# Patient Record
Sex: Female | Born: 1955 | Race: White | Hispanic: No | Marital: Married | Smoking: Never smoker
Health system: Southern US, Community
[De-identification: ages and names within clinical notes are randomized; demographics above are authoritative.]

## PROBLEM LIST (undated history)

## (undated) DIAGNOSIS — Z853 Personal history of malignant neoplasm of breast: Secondary | ICD-10-CM

## (undated) DIAGNOSIS — F329 Major depressive disorder, single episode, unspecified: Secondary | ICD-10-CM

## (undated) DIAGNOSIS — K219 Gastro-esophageal reflux disease without esophagitis: Secondary | ICD-10-CM

## (undated) DIAGNOSIS — F209 Schizophrenia, unspecified: Secondary | ICD-10-CM

## (undated) DIAGNOSIS — R109 Unspecified abdominal pain: Secondary | ICD-10-CM

## (undated) DIAGNOSIS — C801 Malignant (primary) neoplasm, unspecified: Secondary | ICD-10-CM

## (undated) DIAGNOSIS — I10 Essential (primary) hypertension: Secondary | ICD-10-CM

## (undated) DIAGNOSIS — G43909 Migraine, unspecified, not intractable, without status migrainosus: Secondary | ICD-10-CM

## (undated) DIAGNOSIS — E785 Hyperlipidemia, unspecified: Secondary | ICD-10-CM

## (undated) DIAGNOSIS — J309 Allergic rhinitis, unspecified: Secondary | ICD-10-CM

## (undated) DIAGNOSIS — N201 Calculus of ureter: Secondary | ICD-10-CM

## (undated) DIAGNOSIS — I5043 Acute on chronic combined systolic (congestive) and diastolic (congestive) heart failure: Secondary | ICD-10-CM

## (undated) DIAGNOSIS — E119 Type 2 diabetes mellitus without complications: Principal | ICD-10-CM

## (undated) DIAGNOSIS — G40409 Other generalized epilepsy and epileptic syndromes, not intractable, without status epilepticus: Secondary | ICD-10-CM

## (undated) DIAGNOSIS — D649 Anemia, unspecified: Secondary | ICD-10-CM

## (undated) DIAGNOSIS — M1990 Unspecified osteoarthritis, unspecified site: Secondary | ICD-10-CM

## (undated) DIAGNOSIS — F32A Depression, unspecified: Secondary | ICD-10-CM

## (undated) HISTORY — DX: Major depressive disorder, single episode, unspecified: F32.9

## (undated) HISTORY — PX: CATARACT EXTRACTION, BILATERAL: SHX1313

## (undated) HISTORY — PX: BREAST SURGERY: SHX581

## (undated) HISTORY — DX: Essential (primary) hypertension: I10

## (undated) HISTORY — DX: Hyperlipidemia, unspecified: E78.5

## (undated) HISTORY — DX: Migraine, unspecified, not intractable, without status migrainosus: G43.909

## (undated) HISTORY — DX: Malignant (primary) neoplasm, unspecified: C80.1

## (undated) HISTORY — DX: Gastro-esophageal reflux disease without esophagitis: K21.9

## (undated) HISTORY — DX: Type 2 diabetes mellitus without complications: E11.9

## (undated) HISTORY — DX: Depression, unspecified: F32.A

## (undated) HISTORY — PX: LAPAROSCOPIC CHOLECYSTECTOMY: SUR755

## (undated) HISTORY — PX: OTHER SURGICAL HISTORY: SHX169

---

## 2000-01-04 ENCOUNTER — Other Ambulatory Visit: Admission: RE | Admit: 2000-01-04 | Discharge: 2000-01-04 | Payer: Self-pay | Admitting: Obstetrics and Gynecology

## 2000-01-07 ENCOUNTER — Other Ambulatory Visit: Admission: RE | Admit: 2000-01-07 | Discharge: 2000-01-07 | Payer: Self-pay | Admitting: Obstetrics and Gynecology

## 2000-01-07 ENCOUNTER — Encounter (INDEPENDENT_AMBULATORY_CARE_PROVIDER_SITE_OTHER): Payer: Self-pay

## 2000-04-10 ENCOUNTER — Other Ambulatory Visit: Admission: RE | Admit: 2000-04-10 | Discharge: 2000-04-10 | Payer: Self-pay | Admitting: Obstetrics and Gynecology

## 2001-08-09 ENCOUNTER — Emergency Department (HOSPITAL_COMMUNITY): Admission: EM | Admit: 2001-08-09 | Discharge: 2001-08-10 | Payer: Self-pay | Admitting: Emergency Medicine

## 2001-08-10 ENCOUNTER — Encounter: Payer: Self-pay | Admitting: Emergency Medicine

## 2004-06-13 ENCOUNTER — Other Ambulatory Visit: Admission: RE | Admit: 2004-06-13 | Discharge: 2004-06-13 | Payer: Self-pay | Admitting: Family Medicine

## 2004-10-04 ENCOUNTER — Encounter: Admission: RE | Admit: 2004-10-04 | Discharge: 2004-10-04 | Payer: Self-pay | Admitting: Family Medicine

## 2005-02-15 ENCOUNTER — Emergency Department (HOSPITAL_COMMUNITY): Admission: EM | Admit: 2005-02-15 | Discharge: 2005-02-15 | Payer: Self-pay | Admitting: Emergency Medicine

## 2005-07-03 ENCOUNTER — Other Ambulatory Visit: Admission: RE | Admit: 2005-07-03 | Discharge: 2005-07-03 | Payer: Self-pay | Admitting: Family Medicine

## 2005-08-01 ENCOUNTER — Other Ambulatory Visit (HOSPITAL_COMMUNITY): Admission: RE | Admit: 2005-08-01 | Discharge: 2005-08-15 | Payer: Self-pay | Admitting: Psychiatry

## 2005-08-01 ENCOUNTER — Ambulatory Visit: Payer: Self-pay | Admitting: Psychiatry

## 2005-09-13 ENCOUNTER — Encounter (INDEPENDENT_AMBULATORY_CARE_PROVIDER_SITE_OTHER): Payer: Self-pay | Admitting: Specialist

## 2005-09-13 ENCOUNTER — Ambulatory Visit (HOSPITAL_COMMUNITY): Admission: RE | Admit: 2005-09-13 | Discharge: 2005-09-13 | Payer: Self-pay | Admitting: Gastroenterology

## 2006-04-04 ENCOUNTER — Emergency Department (HOSPITAL_COMMUNITY): Admission: EM | Admit: 2006-04-04 | Discharge: 2006-04-04 | Payer: Self-pay | Admitting: Emergency Medicine

## 2006-06-19 ENCOUNTER — Ambulatory Visit: Payer: Self-pay | Admitting: Family Medicine

## 2006-07-03 ENCOUNTER — Ambulatory Visit: Payer: Self-pay | Admitting: Family Medicine

## 2006-07-11 ENCOUNTER — Ambulatory Visit: Payer: Self-pay | Admitting: Family Medicine

## 2006-09-11 ENCOUNTER — Ambulatory Visit: Payer: Self-pay | Admitting: Family Medicine

## 2006-10-10 ENCOUNTER — Ambulatory Visit: Payer: Self-pay | Admitting: Family Medicine

## 2006-10-21 ENCOUNTER — Ambulatory Visit: Payer: Self-pay | Admitting: Family Medicine

## 2007-02-13 ENCOUNTER — Ambulatory Visit: Payer: Self-pay | Admitting: Family Medicine

## 2007-06-04 ENCOUNTER — Ambulatory Visit: Payer: Self-pay | Admitting: Family Medicine

## 2007-07-16 ENCOUNTER — Other Ambulatory Visit: Admission: RE | Admit: 2007-07-16 | Discharge: 2007-07-16 | Payer: Self-pay | Admitting: Family Medicine

## 2007-07-16 ENCOUNTER — Ambulatory Visit: Payer: Self-pay | Admitting: Family Medicine

## 2007-08-11 ENCOUNTER — Ambulatory Visit: Payer: Self-pay | Admitting: Family Medicine

## 2007-10-02 ENCOUNTER — Ambulatory Visit: Payer: Self-pay | Admitting: Family Medicine

## 2008-09-02 ENCOUNTER — Ambulatory Visit: Payer: Self-pay | Admitting: Family Medicine

## 2008-10-21 ENCOUNTER — Ambulatory Visit: Payer: Self-pay | Admitting: Family Medicine

## 2008-11-25 ENCOUNTER — Encounter: Admission: RE | Admit: 2008-11-25 | Discharge: 2008-11-25 | Payer: Self-pay | Admitting: Radiology

## 2008-12-22 ENCOUNTER — Ambulatory Visit: Payer: Self-pay | Admitting: Oncology

## 2008-12-23 ENCOUNTER — Ambulatory Visit: Payer: Self-pay | Admitting: Genetic Counselor

## 2008-12-23 ENCOUNTER — Ambulatory Visit: Payer: Self-pay | Admitting: Family Medicine

## 2009-01-02 ENCOUNTER — Encounter: Admission: RE | Admit: 2009-01-02 | Discharge: 2009-01-02 | Payer: Self-pay | Admitting: Surgery

## 2009-01-04 ENCOUNTER — Encounter (INDEPENDENT_AMBULATORY_CARE_PROVIDER_SITE_OTHER): Payer: Self-pay | Admitting: Surgery

## 2009-01-04 ENCOUNTER — Ambulatory Visit (HOSPITAL_BASED_OUTPATIENT_CLINIC_OR_DEPARTMENT_OTHER): Admission: RE | Admit: 2009-01-04 | Discharge: 2009-01-04 | Payer: Self-pay | Admitting: Surgery

## 2009-01-16 ENCOUNTER — Ambulatory Visit: Payer: Self-pay | Admitting: Radiation Oncology

## 2009-02-09 ENCOUNTER — Ambulatory Visit: Payer: Self-pay | Admitting: Radiation Oncology

## 2009-02-09 ENCOUNTER — Ambulatory Visit: Payer: Self-pay | Admitting: Genetic Counselor

## 2009-02-13 ENCOUNTER — Ambulatory Visit: Payer: Self-pay

## 2009-02-13 ENCOUNTER — Ambulatory Visit: Payer: Self-pay | Admitting: Radiation Oncology

## 2009-03-16 ENCOUNTER — Ambulatory Visit: Payer: Self-pay | Admitting: Radiation Oncology

## 2009-04-12 ENCOUNTER — Ambulatory Visit: Payer: Self-pay | Admitting: Family Medicine

## 2009-04-15 ENCOUNTER — Ambulatory Visit: Payer: Self-pay | Admitting: Radiation Oncology

## 2009-04-19 ENCOUNTER — Ambulatory Visit: Payer: Self-pay | Admitting: Genetic Counselor

## 2009-04-21 ENCOUNTER — Ambulatory Visit: Payer: Self-pay | Admitting: Oncology

## 2009-04-21 LAB — COMPREHENSIVE METABOLIC PANEL
AST: 44 U/L — ABNORMAL HIGH (ref 0–37)
Albumin: 4.3 g/dL (ref 3.5–5.2)
Alkaline Phosphatase: 126 U/L — ABNORMAL HIGH (ref 39–117)
Glucose, Bld: 133 mg/dL — ABNORMAL HIGH (ref 70–99)
Potassium: 3.4 mEq/L — ABNORMAL LOW (ref 3.5–5.3)
Sodium: 143 mEq/L (ref 135–145)
Total Protein: 6.6 g/dL (ref 6.0–8.3)

## 2009-04-21 LAB — CBC WITH DIFFERENTIAL/PLATELET
EOS%: 2.3 % (ref 0.0–7.0)
Eosinophils Absolute: 0.1 10*3/uL (ref 0.0–0.5)
MCV: 83.7 fL (ref 79.5–101.0)
MONO%: 9.1 % (ref 0.0–14.0)
NEUT#: 2.5 10*3/uL (ref 1.5–6.5)
RBC: 4.35 10*6/uL (ref 3.70–5.45)
RDW: 14.3 % (ref 11.2–14.5)

## 2009-04-27 ENCOUNTER — Ambulatory Visit: Payer: Self-pay | Admitting: Family Medicine

## 2009-04-27 ENCOUNTER — Encounter: Admission: RE | Admit: 2009-04-27 | Discharge: 2009-04-27 | Payer: Self-pay | Admitting: Family Medicine

## 2009-05-16 ENCOUNTER — Ambulatory Visit: Payer: Self-pay | Admitting: Radiation Oncology

## 2009-06-15 ENCOUNTER — Ambulatory Visit: Payer: Self-pay | Admitting: Radiation Oncology

## 2009-07-12 ENCOUNTER — Ambulatory Visit: Payer: Self-pay | Admitting: Oncology

## 2009-07-13 ENCOUNTER — Emergency Department (HOSPITAL_COMMUNITY): Admission: EM | Admit: 2009-07-13 | Discharge: 2009-07-13 | Payer: Self-pay | Admitting: Emergency Medicine

## 2009-07-14 ENCOUNTER — Ambulatory Visit (HOSPITAL_COMMUNITY): Admission: RE | Admit: 2009-07-14 | Discharge: 2009-07-14 | Payer: Self-pay | Admitting: Neurology

## 2009-09-04 ENCOUNTER — Ambulatory Visit: Payer: Self-pay | Admitting: Family Medicine

## 2009-09-25 ENCOUNTER — Ambulatory Visit: Payer: Self-pay | Admitting: Oncology

## 2009-09-27 ENCOUNTER — Ambulatory Visit: Payer: Self-pay | Admitting: Family Medicine

## 2009-09-27 LAB — CBC WITH DIFFERENTIAL/PLATELET
Basophils Absolute: 0 10*3/uL (ref 0.0–0.1)
Eosinophils Absolute: 0.2 10*3/uL (ref 0.0–0.5)
HGB: 12.8 g/dL (ref 11.6–15.9)
LYMPH%: 22.3 % (ref 14.0–49.7)
MCV: 86.7 fL (ref 79.5–101.0)
MONO#: 0.3 10*3/uL (ref 0.1–0.9)
NEUT#: 3.7 10*3/uL (ref 1.5–6.5)
Platelets: 322 10*3/uL (ref 145–400)
RBC: 4.36 10*6/uL (ref 3.70–5.45)
RDW: 14.1 % (ref 11.2–14.5)
WBC: 5.5 10*3/uL (ref 3.9–10.3)

## 2009-09-28 LAB — LACTATE DEHYDROGENASE: LDH: 164 U/L (ref 94–250)

## 2009-09-28 LAB — COMPREHENSIVE METABOLIC PANEL
Albumin: 4.4 g/dL (ref 3.5–5.2)
BUN: 11 mg/dL (ref 6–23)
CO2: 21 mEq/L (ref 19–32)
Glucose, Bld: 176 mg/dL — ABNORMAL HIGH (ref 70–99)
Potassium: 4.1 mEq/L (ref 3.5–5.3)
Sodium: 144 mEq/L (ref 135–145)
Total Protein: 6.7 g/dL (ref 6.0–8.3)

## 2009-09-28 LAB — CANCER ANTIGEN 27.29: CA 27.29: 14 U/mL (ref 0–39)

## 2010-01-16 ENCOUNTER — Ambulatory Visit: Payer: Self-pay | Admitting: Radiation Oncology

## 2010-01-22 ENCOUNTER — Ambulatory Visit: Payer: Self-pay | Admitting: Radiation Oncology

## 2010-02-13 ENCOUNTER — Ambulatory Visit: Payer: Self-pay | Admitting: Radiation Oncology

## 2010-04-03 ENCOUNTER — Ambulatory Visit: Payer: Self-pay | Admitting: Oncology

## 2010-04-09 LAB — CANCER ANTIGEN 27.29: CA 27.29: 16 U/mL (ref 0–39)

## 2010-04-09 LAB — COMPREHENSIVE METABOLIC PANEL
AST: 23 U/L (ref 0–37)
Albumin: 3.8 g/dL (ref 3.5–5.2)
Alkaline Phosphatase: 106 U/L (ref 39–117)
BUN: 11 mg/dL (ref 6–23)
Glucose, Bld: 106 mg/dL — ABNORMAL HIGH (ref 70–99)
Potassium: 3.7 mEq/L (ref 3.5–5.3)
Sodium: 142 mEq/L (ref 135–145)
Total Bilirubin: 0.6 mg/dL (ref 0.3–1.2)
Total Protein: 6.5 g/dL (ref 6.0–8.3)

## 2010-04-09 LAB — CBC WITH DIFFERENTIAL/PLATELET
EOS%: 3.9 % (ref 0.0–7.0)
LYMPH%: 31.2 % (ref 14.0–49.7)
MCH: 28.2 pg (ref 25.1–34.0)
MCV: 84.1 fL (ref 79.5–101.0)
MONO%: 8.6 % (ref 0.0–14.0)
Platelets: 299 10*3/uL (ref 145–400)
RBC: 4.35 10*6/uL (ref 3.70–5.45)
RDW: 14.6 % — ABNORMAL HIGH (ref 11.2–14.5)

## 2010-07-16 ENCOUNTER — Ambulatory Visit: Payer: Self-pay | Admitting: Radiation Oncology

## 2010-07-23 ENCOUNTER — Ambulatory Visit: Payer: Self-pay | Admitting: Radiation Oncology

## 2010-08-16 ENCOUNTER — Ambulatory Visit: Payer: Self-pay | Admitting: Radiation Oncology

## 2010-09-05 ENCOUNTER — Other Ambulatory Visit: Admission: RE | Admit: 2010-09-05 | Discharge: 2010-09-05 | Payer: Self-pay | Admitting: Family Medicine

## 2010-09-05 ENCOUNTER — Ambulatory Visit: Payer: Self-pay | Admitting: Family Medicine

## 2010-10-10 ENCOUNTER — Ambulatory Visit: Payer: Self-pay | Admitting: Oncology

## 2010-10-12 LAB — CBC WITH DIFFERENTIAL/PLATELET
Eosinophils Absolute: 0.2 10*3/uL (ref 0.0–0.5)
LYMPH%: 27.6 % (ref 14.0–49.7)
MONO#: 0.4 10*3/uL (ref 0.1–0.9)
NEUT#: 3.7 10*3/uL (ref 1.5–6.5)
Platelets: 365 10*3/uL (ref 145–400)
RBC: 4.58 10*6/uL (ref 3.70–5.45)
RDW: 15.8 % — ABNORMAL HIGH (ref 11.2–14.5)
WBC: 6 10*3/uL (ref 3.9–10.3)
lymph#: 1.6 10*3/uL (ref 0.9–3.3)

## 2010-10-13 LAB — COMPREHENSIVE METABOLIC PANEL
ALT: 25 U/L (ref 0–35)
Albumin: 4.5 g/dL (ref 3.5–5.2)
CO2: 22 mEq/L (ref 19–32)
Calcium: 9.9 mg/dL (ref 8.4–10.5)
Chloride: 106 mEq/L (ref 96–112)
Glucose, Bld: 119 mg/dL — ABNORMAL HIGH (ref 70–99)
Potassium: 3.7 mEq/L (ref 3.5–5.3)
Sodium: 142 mEq/L (ref 135–145)
Total Protein: 6.6 g/dL (ref 6.0–8.3)

## 2010-10-13 LAB — LACTATE DEHYDROGENASE: LDH: 135 U/L (ref 94–250)

## 2010-10-13 LAB — CANCER ANTIGEN 27.29: CA 27.29: 13 U/mL (ref 0–39)

## 2010-11-28 ENCOUNTER — Ambulatory Visit: Payer: Self-pay | Admitting: Oncology

## 2010-12-11 ENCOUNTER — Encounter
Admission: RE | Admit: 2010-12-11 | Discharge: 2010-12-11 | Payer: Self-pay | Source: Home / Self Care | Attending: Family Medicine | Admitting: Family Medicine

## 2010-12-11 ENCOUNTER — Ambulatory Visit: Payer: Self-pay | Admitting: Family Medicine

## 2011-01-07 ENCOUNTER — Ambulatory Visit
Admission: RE | Admit: 2011-01-07 | Discharge: 2011-01-07 | Payer: Self-pay | Source: Home / Self Care | Attending: Family Medicine | Admitting: Family Medicine

## 2011-03-01 ENCOUNTER — Ambulatory Visit (INDEPENDENT_AMBULATORY_CARE_PROVIDER_SITE_OTHER): Payer: BC Managed Care – PPO | Admitting: Family Medicine

## 2011-03-01 DIAGNOSIS — K5289 Other specified noninfective gastroenteritis and colitis: Secondary | ICD-10-CM

## 2011-03-07 ENCOUNTER — Ambulatory Visit: Payer: Self-pay | Admitting: Family Medicine

## 2011-03-19 ENCOUNTER — Ambulatory Visit: Payer: BC Managed Care – PPO | Admitting: Family Medicine

## 2011-03-21 ENCOUNTER — Ambulatory Visit (INDEPENDENT_AMBULATORY_CARE_PROVIDER_SITE_OTHER): Payer: BC Managed Care – PPO | Admitting: Family Medicine

## 2011-03-21 DIAGNOSIS — H6120 Impacted cerumen, unspecified ear: Secondary | ICD-10-CM

## 2011-03-24 LAB — URINE CULTURE: Colony Count: 25000

## 2011-03-24 LAB — CBC
HCT: 39.8 % (ref 36.0–46.0)
Hemoglobin: 13.3 g/dL (ref 12.0–15.0)
RBC: 4.56 MIL/uL (ref 3.87–5.11)
RDW: 14.5 % (ref 11.5–15.5)

## 2011-03-24 LAB — RAPID URINE DRUG SCREEN, HOSP PERFORMED
Barbiturates: NOT DETECTED
Benzodiazepines: POSITIVE — AB
Opiates: NOT DETECTED

## 2011-03-24 LAB — COMPREHENSIVE METABOLIC PANEL
ALT: 62 U/L — ABNORMAL HIGH (ref 0–35)
Alkaline Phosphatase: 122 U/L — ABNORMAL HIGH (ref 39–117)
BUN: 10 mg/dL (ref 6–23)
CO2: 25 mEq/L (ref 19–32)
Chloride: 109 mEq/L (ref 96–112)
GFR calc non Af Amer: >60 mL/min (ref >60)
Glucose, Bld: 134 mg/dL — ABNORMAL HIGH (ref 70–99)
Potassium: 4.2 mEq/L (ref 3.5–5.1)
Sodium: 142 mEq/L (ref 135–145)
Total Bilirubin: 0.4 mg/dL (ref 0.3–1.2)

## 2011-03-24 LAB — URINALYSIS, ROUTINE W REFLEX MICROSCOPIC
Glucose, UA: NEGATIVE mg/dL
Hgb urine dipstick: NEGATIVE
Ketones, ur: NEGATIVE mg/dL
pH: 5.5 (ref 5.0–8.0)

## 2011-03-24 LAB — ETHANOL: Alcohol, Ethyl (B): <5 mg/dL (ref 0–10)

## 2011-03-24 LAB — DIFFERENTIAL
Basophils Absolute: 0 10*3/uL (ref 0.0–0.1)
Basophils Relative: 0 % (ref 0–1)
Eosinophils Absolute: 0 10*3/uL (ref 0.0–0.7)
Monocytes Relative: 3 % (ref 3–12)
Neutro Abs: 12.2 10*3/uL — ABNORMAL HIGH (ref 1.7–7.7)
Neutrophils Relative %: 92 % — ABNORMAL HIGH (ref 43–77)

## 2011-03-24 LAB — URINE MICROSCOPIC-ADD ON

## 2011-04-01 LAB — URINALYSIS, ROUTINE W REFLEX MICROSCOPIC
Bilirubin Urine: NEGATIVE
Glucose, UA: NEGATIVE mg/dL
Hgb urine dipstick: NEGATIVE
Protein, ur: NEGATIVE mg/dL
Specific Gravity, Urine: 1.015 (ref 1.005–1.030)

## 2011-04-01 LAB — DIFFERENTIAL
Basophils Absolute: 0 10*3/uL (ref 0.0–0.1)
Basophils Relative: 0 % (ref 0–1)
Eosinophils Absolute: 0.1 10*3/uL (ref 0.0–0.7)
Eosinophils Relative: 1 % (ref 0–5)
Lymphocytes Relative: 26 % (ref 12–46)
Monocytes Absolute: 0.4 10*3/uL (ref 0.1–1.0)

## 2011-04-01 LAB — COMPREHENSIVE METABOLIC PANEL
ALT: 87 U/L — ABNORMAL HIGH (ref 0–35)
AST: 60 U/L — ABNORMAL HIGH (ref 0–37)
Albumin: 4 g/dL (ref 3.5–5.2)
Alkaline Phosphatase: 119 U/L — ABNORMAL HIGH (ref 39–117)
Chloride: 100 mEq/L (ref 96–112)
GFR calc Af Amer: >60 mL/min (ref >60)
Potassium: 3.8 mEq/L (ref 3.5–5.1)
Total Bilirubin: 0.7 mg/dL (ref 0.3–1.2)

## 2011-04-01 LAB — URINE MICROSCOPIC-ADD ON

## 2011-04-01 LAB — CBC
Platelets: 342 10*3/uL (ref 150–400)
WBC: 7.6 10*3/uL (ref 4.0–10.5)

## 2011-04-01 LAB — HM MAMMOGRAPHY

## 2011-04-30 NOTE — Op Note (Signed)
NAME:  Brandi [name], Brandi [name]             ACCOUNT NO.:  [id number]   MEDICAL RECORD NO.:  [id number]          PATIENT TYPE:  AMB   LOCATION:  DSC                          FACILITY:  [facility]   PHYSICIAN:  Currie Paris, M.D.DATE OF BIRTH:  June 05, 1956   DATE OF PROCEDURE:  01/04/2009  DATE OF DISCHARGE:                               OPERATIVE REPORT   PREOPERATIVE DIAGNOSIS:  Carcinoma of left breast, upper inner quadrant,  clinical stage I.   POSTOPERATIVE DIAGNOSIS:  Carcinoma of left breast, upper inner  quadrant, clinical stage I.   OPERATION:  Needle-guided left lumpectomy with blue dye injection and  axillary sentinel lymph node dissection (2 lymph nodes removed).   SURGEON:  Currie Paris, MD   ANESTHESIA:  General.   CLINICAL HISTORY:  This is a 55 year old lady whose recent mammogram  showed a small lesion in the upper inner quadrant of the left breast.  After discussion of alternatives with the patient, we elected to proceed  to needle-guided excision with sentinel node evaluation.   DESCRIPTION OF PROCEDURE:  The patient was seen in the holding area and  she and her husband had no further questions.  We reviewed the plans for  surgery as noted above and noted that if her node was positive, we would  need to a full node dissection and place a drain.  We went over surgery,  and she had no further questions.   The patient was taken to the operating room and after satisfactory  general anesthesia had been obtained, the time-out was performed and I  then injected the left breast with 5 mL of dilute methylene blue.  A  full prep and drape was then done.  The time-out was done.   I reviewed the mammogram films prior to taking the patient to the  operating room, and the guidewire entered in the medial part of the  breast and traveled laterally almost directly horizontally and was just  into the upper inner quadrant of the left breast.  There was an X on  the skin  overlying the tumor.   I outlined an elliptical incision for the lumpectomy.   I then turned my attention to the axilla.  Using Neoprobe, I found a hot  area.  I made a transverse incision, divided subcutaneous tissues, and  found a blue lymphatic.  With a self-retaining retractor in, I was able  to follow that down into a bright blue lymph node which was removed.  It  had counts of about 400.  It was fairly anterior.   Using the Neoprobe, I found another hot area.  This one slightly more  inferior and more posterior.  I did some more dissection here and was  eventually able to find a soft node which I was able to grasp and excise  and this had counts of about 6 to 800.  There was no blue dye, however.   With these two nodes out, I palpated no abnormal nodes, and there were  no further hot areas with all counts being down to 0 to 10 or 15.  I  placed a moist pack awaiting for pathology.   Attention was turned back to the breast.  I went ahead and made the  elliptical incision.  I divided the breast tissue on the superior edge  down towards chest wall leaving just a small rim of fat onto the chest.  I then went medially down to the chest wall, then raised an inferior  flap and started going down towards the chest wall.  I then came under  this and saw that I had it completely loose except for some inferior  deep attachments.  As I was dividing those, I encountered a fairly firm  mass, and  I was right at the very edge of this with my dissection.   I took this specimen off and labeled it for orientation purposes and  sent it for specimen mammography.  I had anticipated the actual tumor to  be in almost at the exact center of the lumpectomy, so it was unclear  what this extra nodular area was.  However, I went back to that exact  spot on the remaining breast and took another centimeter thick margin  from where that lesion had been.   I spent several minutes irrigating, making sure  everything was dry.  I  put some clips on to mark the margins.  I put Marcaine in to help with  postop pain relief.   At this point, Dr. Debby Bud reported that the lymph nodes were negative, so  I went ahead and rechecked the axillary incision, made sure everything  was dry and closed in layers with 3-0 Vicryl and 4-0 Monocryl  subcuticular.  As that was being completed, Dr. Yolanda Bonine called to let  me know that the tumor and the clips seemed to be in the exact center of  the specimen.  With that confirmation, I went ahead and closed the  lumpectomy site in layers with 3-0 Vicryl followed by 4-0 Monocryl  subcuticular plus Dermabond.   The patient tolerated the procedure well, and there were no  complications.  All counts were correct.      Currie Paris, M.D.  Electronically Signed     CJS/MEDQ  D:  01/04/2009  T:  01/05/2009  Job:  [id number]   cc:   Sharlot Gowda, M.D.

## 2011-05-03 NOTE — Op Note (Signed)
NAME:  Brandi [name], Brandi [name]             ACCOUNT NO.:  [id number]   MEDICAL RECORD NO.:  [id number]          PATIENT TYPE:  AMB   LOCATION:  ENDO                         FACILITY:  [facility]   PHYSICIAN:  Anselmo Rod, M.D.  DATE OF BIRTH:  [birth year]   DATE OF PROCEDURE:  09/13/2005  DATE OF DISCHARGE:                                 OPERATIVE REPORT   PROCEDURE PERFORMED:  Colonoscopy with cold biopsy times one.   ENDOSCOPIST:  Anselmo Rod, M.D.   INSTRUMENT USED:  Olympus video colonoscope.   INDICATIONS FOR PROCEDURE:  This is a 55 year old white female with a  history of guaiac positive stool who is undergoing screening colonoscopy to  rule out colonic polyps, masses, etc.   PREPROCEDURE PREPARATION:  Informed consent as procured from the patient.  The patient fasted eight hours prior to the procedure and prepped with a  bottle of magnesium citrate and a gallon of GoLYTELY the night prior to the  procedure.  The risks and benefits of the procedure including at 10% risk of  missed cancer or polyp was discussed with the patient as well.   PREPROCEDURE PHYSICAL EXAMINATION:  VITAL SIGNS:  The patient has stable  vital signs.  NECK:  The neck is supple.  CHEST:  The chest is clear to auscultation.  HEART:  S1 and S2, regular.  ABDOMEN:  The abdomen is soft with normal bowel sounds.   DESCRIPTION OF THE PROCEDURE:  The patient was placed in the left lateral  decubitus position, and sedated with an additional 25 mg of Demerol and 2.5  mg of Versed in incremental doses.  Once the patient was adequately sedated,  maintained on low-flow oxygen and continuous cardiac monitoring the Olympus  colonoscope was advanced from the rectum to the cecum where the appendiceal  orifice and the ileocecal valve were clearly visualized and photographed.  There was a significant amount of residual stool in the colon and multiple  washings were done. there was some residual stool in the proximal  right  colon.  Moderate sized internal hemorrhoids were noted on retroflexion.   The patient tolerated the procedure without complications.   IMPRESSION:  1.  Small nonbleeding internal hemorrhoid.  2.  Small polyp removed by cold biopsy from the proximal right colon.  3.  Significant amount of residual stool in the colon; small lesions could      have been missed.  4.  No evidence of diverticulosis.   RECOMMENDATIONS:  1.  Await pathology results.  2.  Avoid nonsteroidals and aspirin for the next two weeks.  3.  Repeat colonoscopy depending on pathology results.  4.  Outpatient follow up within the next two weeks for further      recommendations.      Anselmo Rod, M.D.  Electronically Signed     JNM/MEDQ  D:  09/13/2005  T:  09/14/2005  Job:  [id number]   cc:   Sharlot Gowda, M.D.  Fax: [phone]

## 2011-05-03 NOTE — Op Note (Signed)
NAME:  Brandi [name], Brandi [name]             ACCOUNT NO.:  [id number]   MEDICAL RECORD NO.:  [id number]          PATIENT TYPE:  AMB   LOCATION:  ENDO                         FACILITY:  [facility]   PHYSICIAN:  Anselmo Rod, M.D.  DATE OF BIRTH:  [birth year]   DATE OF PROCEDURE:  09/13/2005  DATE OF DISCHARGE:                                 OPERATIVE REPORT   PROCEDURE:  Esophagogastroduodenoscopy.   ENDOSCOPIST:  Charna Elizabeth, M.D.   INSTRUMENT USED:  Olympus video panendoscope.   INDICATIONS FOR PROCEDURE:  A 55 year old white female with history of  guaiac-positive stool with reflux, undergoing an EGD to rule out peptic  ulcer disease, esophagitis, gastritis, etc.   PREPROCEDURE PREPARATION:  Informed consent was procured from the patient.  The patient was fasted for eight hours prior to the procedure.   PREPROCEDURE PHYSICAL:  VITAL SIGNS:  The patient had stable vital signs.  NECK:  Supple.  CHEST:  Clear to auscultation.  CARDIAC:  S1 and S2 regular.  ABDOMEN:  Soft with normal bowel sounds.   DESCRIPTION OF PROCEDURE:  The patient was placed in the left lateral  decubitus position and sedated with 50 mg of Demerol and 5 mg of Versed in  slow incremental doses.  Once the patient was adequately sedated and  maintained on low-flow oxygen and continuous cardiac monitoring, the Olympus  video panendoscope was advance through the mouth piece over the tongue, into  the esophagus under direct vision.  There was severe grade 4 distal  esophagitis noted with friability and easy bleeding from the esophageal  mucosa.  The scope was then advanced into the stomach.  A small hiatal  hernia was seen on high retroflexion.  The rest of the gastric mucosa and  the proximal small bowel appeared normal.   IMPRESSION:  1.  Severe grade 4 distal esophagitis.  2.  Long hiatal hernia.  3.  Normal-appearing gastric mucosa and proximal small bowel.   RECOMMENDATIONS:  1.  Protonix have been given to the  patient from the office.  She is to take      40 mg twice a day for the first week and then decrease it to 40 mg p.o.      daily.  2.  Adherence to antireflux measures and avoidance of nonsteroidals have      been advised.  3.  Carafate slurry 1 g four times a day has been advised as well as has      been called into her pharmacy.  4.  Further recommendations to be made in followup.  5.  Proceed with colonoscopy.      Anselmo Rod, M.D.  Electronically Signed     JNM/MEDQ  D:  09/13/2005  T:  09/14/2005  Job:  [id number]   cc:   Sharlot Gowda, M.D.  Fax: [phone]

## 2011-05-21 ENCOUNTER — Other Ambulatory Visit: Payer: Self-pay | Admitting: Oncology

## 2011-05-21 ENCOUNTER — Encounter (HOSPITAL_BASED_OUTPATIENT_CLINIC_OR_DEPARTMENT_OTHER): Payer: BC Managed Care – PPO | Admitting: Oncology

## 2011-05-21 DIAGNOSIS — Z170 Estrogen receptor positive status [ER+]: Secondary | ICD-10-CM

## 2011-05-21 DIAGNOSIS — C50219 Malignant neoplasm of upper-inner quadrant of unspecified female breast: Secondary | ICD-10-CM

## 2011-05-21 LAB — CBC WITH DIFFERENTIAL/PLATELET
Basophils Absolute: 0 10*3/uL (ref 0.0–0.1)
Eosinophils Absolute: 0.1 10*3/uL (ref 0.0–0.5)
HGB: 12.1 g/dL (ref 11.6–15.9)
MCV: 78.8 fL — ABNORMAL LOW (ref 79.5–101.0)
MONO#: 0.5 10*3/uL (ref 0.1–0.9)
NEUT#: 5.5 10*3/uL (ref 1.5–6.5)
RBC: 4.76 10*6/uL (ref 3.70–5.45)
RDW: 14.2 % (ref 11.2–14.5)
WBC: 8.2 10*3/uL (ref 3.9–10.3)
lymph#: 2 10*3/uL (ref 0.9–3.3)
nRBC: 0 % (ref 0–0)

## 2011-05-21 LAB — COMPREHENSIVE METABOLIC PANEL
ALT: 24 U/L (ref 0–35)
Albumin: 3.8 g/dL (ref 3.5–5.2)
CO2: 29 mEq/L (ref 19–32)
Chloride: 103 mEq/L (ref 96–112)
Glucose, Bld: 97 mg/dL (ref 70–99)
Potassium: 3.4 mEq/L — ABNORMAL LOW (ref 3.5–5.3)
Sodium: 142 mEq/L (ref 135–145)
Total Protein: 6.8 g/dL (ref 6.0–8.3)

## 2011-05-21 LAB — VITAMIN D 25 HYDROXY (VIT D DEFICIENCY, FRACTURES): Vit D, 25-Hydroxy: 61 ng/mL (ref 30–89)

## 2011-05-28 ENCOUNTER — Other Ambulatory Visit: Payer: Self-pay

## 2011-05-28 MED ORDER — PANTOPRAZOLE SODIUM 40 MG PO TBEC: 40.0000 mg | DELAYED_RELEASE_TABLET | Freq: Every day | ORAL | Status: DC

## 2011-05-31 ENCOUNTER — Encounter (HOSPITAL_BASED_OUTPATIENT_CLINIC_OR_DEPARTMENT_OTHER): Payer: BC Managed Care – PPO | Admitting: Oncology

## 2011-05-31 DIAGNOSIS — C50219 Malignant neoplasm of upper-inner quadrant of unspecified female breast: Secondary | ICD-10-CM

## 2011-06-18 ENCOUNTER — Encounter: Payer: Self-pay | Admitting: Family Medicine

## 2011-09-09 ENCOUNTER — Encounter: Payer: Self-pay | Admitting: Family Medicine

## 2011-09-10 ENCOUNTER — Encounter: Payer: Self-pay | Admitting: Family Medicine

## 2011-09-10 ENCOUNTER — Ambulatory Visit (INDEPENDENT_AMBULATORY_CARE_PROVIDER_SITE_OTHER): Payer: BC Managed Care – PPO | Admitting: Family Medicine

## 2011-09-10 VITALS — BP 118/70 | HR 90 | Ht 64.11 in | Wt 182.0 lb

## 2011-09-10 DIAGNOSIS — G43909 Migraine, unspecified, not intractable, without status migrainosus: Secondary | ICD-10-CM

## 2011-09-10 DIAGNOSIS — K219 Gastro-esophageal reflux disease without esophagitis: Secondary | ICD-10-CM

## 2011-09-10 DIAGNOSIS — Z23 Encounter for immunization: Secondary | ICD-10-CM

## 2011-09-10 DIAGNOSIS — E785 Hyperlipidemia, unspecified: Secondary | ICD-10-CM

## 2011-09-10 DIAGNOSIS — Z853 Personal history of malignant neoplasm of breast: Secondary | ICD-10-CM

## 2011-09-10 DIAGNOSIS — Z0000 Encounter for general adult medical examination without abnormal findings: Secondary | ICD-10-CM

## 2011-09-10 DIAGNOSIS — J301 Allergic rhinitis due to pollen: Secondary | ICD-10-CM | POA: Insufficient documentation

## 2011-09-10 DIAGNOSIS — E669 Obesity, unspecified: Secondary | ICD-10-CM

## 2011-09-10 DIAGNOSIS — F329 Major depressive disorder, single episode, unspecified: Secondary | ICD-10-CM | POA: Insufficient documentation

## 2011-09-10 LAB — POCT URINALYSIS DIPSTICK
Glucose, UA: NEGATIVE
Leukocytes, UA: NEGATIVE
Nitrite, UA: NEGATIVE
Protein, UA: NEGATIVE
Urobilinogen, UA: NEGATIVE

## 2011-09-10 LAB — LIPID PANEL
Cholesterol: 185 mg/dL (ref 0–200)
Triglycerides: 206 mg/dL — ABNORMAL HIGH (ref <150)

## 2011-09-10 NOTE — Patient Instructions (Signed)
Talk to her psychiatrist about your medicine and the increase in your headaches. See you're headache specialist for followup on headaches to get them under better control.

## 2011-09-10 NOTE — Progress Notes (Signed)
Subjective:    Patient ID: Brandi [name], female    DOB: 1956-12-13, 55 y.o.   MRN: [id number]  HPI She is here for complete examination. She is a poor historian and therefore good history is difficult to obtain .She states that she has had approximately 11 headaches since September 5. Her psychiatrist is in the process of readjusting medications. This started prior to the headaches. She does see a neurologist on a regular basis for these. She has had some difficulty with some left-sided breast tenderness. She has also had difficulty with speech change and tremor and her psychiatrist is working with her on this by readjusting her medications Her last mammogram did show some scar tissue on the left .Her allergies are under good control. Reflux is not causing any trouble. She has gained a little bit of weight in the last several months.   Review of Systems     Objective:   Physical Exam BP 118/70  Pulse 90  Ht 5' 4.11" (1.628 m)  Wt 182 lb (82.555 kg)  BMI 31.13 kg/m2  General Appearance:    Alert, cooperative, no distress, appears stated age  Head:    Normocephalic, without obvious abnormality, atraumatic  Eyes:    PERRL, conjunctiva/corneas clear, EOM's intact, fundi    benign  Ears:    Normal TM's and external ear canals are normal after cerumen was removed   Nose:   Nares normal, mucosa normal, no drainage or sinus   tenderness  Throat:   Lips, mucosa, and tongue normal; teeth and gums normal  Neck:   Supple, no lymphadenopathy;  thyroid:  no   enlargement/tenderness/nodules; no carotid   bruit or JVD  Back:    Spine nontender, no curvature, ROM normal, no CVA     tenderness  Lungs:     Clear to auscultation bilaterally without wheezes, rales or     ronchi; respirations unlabored  Chest Wall:    No tenderness or deformity   Heart:    Regular rate and rhythm, S1 and S2 normal, no murmur, rub   or gallop  Breast Exam:    scarring noted at the base of the left breast consistent  with mammogram   Abdomen:     Soft, non-tender, nondistended, normoactive bowel sounds,    no masses, no hepatosplenomegaly  Genitalia:    Deferred to GYN     Extremities:   No clubbing, cyanosis or edema  Pulses:   2+ and symmetric all extremities  Skin:   Skin color, texture, turgor normal, no rashes or lesions  Lymph nodes:   Cervical, supraclavicular, and axillary nodes normal  Neurologic:   CNII-XII intact, normal strength, sensation and gait; reflexes 2+ and symmetric throughout          Psych:   Normal mood, affect, hygiene and grooming.           Assessment & Plan:   1. Physical exam, annual  POCT Urinalysis Dipstick, Flu vaccine greater than or equal to 3yo with preservative IM, Lipid Profile  2. Major depression    3. Migraine headache    4. Obesity (BMI 30-39.9)    5. GERD (gastroesophageal reflux disease)    6. Allergic rhinitis due to pollen    7. History of breast cancer    8. Hyperlipidemia LDL goal < 100    9    cerumen impaction with lavage She will followup with her neurologist concerning the headaches and with psychiatrist concerning her medication readjustment. Review  of the record indicates he needs to followup with colonoscopy in several years.

## 2011-09-11 ENCOUNTER — Telehealth: Payer: Self-pay | Admitting: *Deleted

## 2011-09-11 NOTE — Telephone Encounter (Addendum)
Message copied by Dorthula Perfect on Wed Sep 11, 2011 12:59 PM ------      Message from: Laureen Ochs F      Created: Wed Sep 11, 2011  9:12 AM   Lipid panel is good. No change in medicine regimen.  Pt notified of lab results.  CM,LPN

## 2011-10-23 ENCOUNTER — Encounter: Payer: Self-pay | Admitting: Family Medicine

## 2011-10-23 ENCOUNTER — Ambulatory Visit (INDEPENDENT_AMBULATORY_CARE_PROVIDER_SITE_OTHER): Payer: BC Managed Care – PPO | Admitting: Family Medicine

## 2011-10-23 VITALS — BP 130/84 | HR 62 | Temp 97.9°F | Wt 184.0 lb

## 2011-10-23 DIAGNOSIS — J069 Acute upper respiratory infection, unspecified: Secondary | ICD-10-CM

## 2011-10-23 DIAGNOSIS — J040 Acute laryngitis: Secondary | ICD-10-CM

## 2011-10-23 NOTE — Progress Notes (Signed)
  Subjective:    Patient ID: Brandi [name], female    DOB: 02-21-1956, 55 y.o.   MRN: [id number]  HPI She has a one-week history of sore throat, hoarse voice, PND and chills. No earache or headache. Developed a cough however is nonproductive. She states that she is roughly 50% better.   Review of Systems     Objective:   Physical Exam alert and in no distress. Tympanic membranes and canals are normal. Throat is clear. Tonsils are normal. Neck is supple without adenopathy or thyromegaly. Cardiac exam shows a regular sinus rhythm without murmurs or gallops. Lungs are clear to auscultation.        Assessment & Plan:   1. URI, acute   2. Laryngitis acute    continue with symptomatic care. Call Friday if no better for possible antibiotic

## 2011-10-23 NOTE — Patient Instructions (Signed)
Continue treating your symptoms the way you have but if you don't continue to improve by Friday give me a call and I will call in an antibiotic

## 2011-10-29 ENCOUNTER — Telehealth: Payer: Self-pay | Admitting: Family Medicine

## 2011-10-29 MED ORDER — ERYTHROMYCIN BASE 500 MG PO TABS: 500.0000 mg | ORAL_TABLET | Freq: Two times a day (BID) | ORAL | Status: AC

## 2011-10-29 NOTE — Telephone Encounter (Signed)
Patient is not improving. I will call in erythromycin.

## 2011-10-29 NOTE — Telephone Encounter (Signed)
Pt called and advised no better, she thinks she has an ear infection.  You told her to let you know and you would call in antibiotic per pt.  Please call the [facility].

## 2011-11-14 ENCOUNTER — Telehealth: Payer: Self-pay | Admitting: Family Medicine

## 2011-11-14 NOTE — Telephone Encounter (Signed)
Pt called she is not much better, still has ear infection, chills, diarrhea, ha.  2nd round antibiotics or OV.  Pt # [phone] [facility].  Please advise pt.

## 2011-11-14 NOTE — Telephone Encounter (Signed)
i see she erythromycin was called out, but msg says something about this was her 2nd antibiotic?  Has she been on 2 different antibiotics of recent?  If not, then I can send out different antibiotic.  However, if she has been on 2 different antibiotic recently and not better, then have her come in for recheck.

## 2011-11-15 ENCOUNTER — Other Ambulatory Visit: Payer: Self-pay | Admitting: Family Medicine

## 2011-11-15 MED ORDER — DOXYCYCLINE HYCLATE 100 MG PO TABS: 100.0000 mg | ORAL_TABLET | Freq: Two times a day (BID) | ORAL | Status: AC

## 2011-11-15 NOTE — Telephone Encounter (Signed)
DOXYCYCLINE 100 MG  BID  #20 CALLED INTO [facility].  Also advised pt of refill and to use nettie pot.

## 2011-11-15 NOTE — Telephone Encounter (Signed)
LMOM NOTIFIYING THE PATIENT THAT HER MEDICATION WAS CALLED INTO THE PHARMACY. CLS

## 2011-11-15 NOTE — Telephone Encounter (Signed)
PT has only been on 1 antibiotic erythromicin.  Please call in 2nd round to [facility].  Pt called again this morning for status.

## 2011-12-06 ENCOUNTER — Other Ambulatory Visit: Payer: BC Managed Care – PPO | Admitting: Lab

## 2011-12-09 ENCOUNTER — Ambulatory Visit (INDEPENDENT_AMBULATORY_CARE_PROVIDER_SITE_OTHER): Payer: BC Managed Care – PPO | Admitting: Family Medicine

## 2011-12-09 ENCOUNTER — Encounter: Payer: Self-pay | Admitting: Family Medicine

## 2011-12-09 VITALS — BP 130/100 | HR 90 | Temp 98.2°F | Resp 14 | Wt 175.0 lb

## 2011-12-09 DIAGNOSIS — H9209 Otalgia, unspecified ear: Secondary | ICD-10-CM

## 2011-12-09 DIAGNOSIS — H9203 Otalgia, bilateral: Secondary | ICD-10-CM

## 2011-12-09 DIAGNOSIS — R61 Generalized hyperhidrosis: Secondary | ICD-10-CM

## 2011-12-09 DIAGNOSIS — IMO0001 Reserved for inherently not codable concepts without codable children: Secondary | ICD-10-CM

## 2011-12-09 NOTE — Progress Notes (Signed)
Patient presents for evaluation of ear discomfort in both ears.  Symptoms began with an illness in November.  She had seen Dr. Susann Givens, dx'd with URI.  Called on 11/13 and was rx'd erythromycin.  Felt like she wasn't any better, so doxycycline was called in on 11/30.  Head congestion, sinus symptoms all resolved.  Had ongoing ear discomfort, fatigue, "feeling bad".  Was sweating, having chills, and tactile fevers (didn't check her temp then).  Currently complains of ongoing chills, sweats.  Ears only bothering her "every now and then". Has been checking her temperature regularly since Friday, and has been < 98.5.  They were told that Latuda (rx'd by Dr. Jennelle Human) can cause flu-like symptoms, and to monitor temps and fax to Dr. Jennelle Human later this week.  Past Medical History  Diagnosis Date  . Seizures   . Allergy     RHINITIS  . Dyslipidemia   . Obesity   . Depression     MAJOR  . GERD (gastroesophageal reflux disease)   . Migraines   . Cancer     BREAST   Past Surgical History  Procedure Date  . Child birth     C-SECTION  . Breast surgery     L LUMPECTOMY   Current Outpatient Prescriptions on File Prior to Visit  Medication Sig Dispense Refill  . anastrozole (ARIMIDEX) 1 MG tablet Take 1 mg by mouth daily.        . calcium-vitamin D (OSCAL WITH D) 250-125 MG-UNIT per tablet Take 1 tablet by mouth daily.        . fish oil-omega-3 fatty acids 1000 MG capsule Take 2 g by mouth daily.        . Garlic 1000 MG CAPS Take 1,000 mg by mouth daily.        . Ginkgo Biloba 40 MG TABS Take 40 mg by mouth daily.        Marland Kitchen lacosamide (VIMPAT) 50 MG TABS Take by mouth 2 (two) times daily.        Marland Kitchen loratadine (CLARITIN) 10 MG tablet Take 10 mg by mouth daily.        Marland Kitchen LORazepam (ATIVAN) 0.5 MG tablet Take 0.5 mg by mouth daily.        Marland Kitchen lurasidone (LATUDA) 80 MG TABS Take by mouth daily with breakfast.        . magnesium 30 MG tablet Take 30 mg by mouth 2 (two) times daily.        . Multiple  Vitamins-Minerals (MULTIVITAMIN WITH MINERALS) tablet Take 1 tablet by mouth daily.        . psyllium (REGULOID) 0.52 G capsule Take 0.52 g by mouth daily. 3 TABS IN AM AND 3 TABS AT BED TIME      . SUMAtriptan (IMITREX) 100 MG tablet Take 100 mg by mouth every 2 (two) hours as needed.        . venlafaxine (EFFEXOR) 75 MG tablet Take 75 mg by mouth 2 (two) times daily. ONE IN THE AM AND ONE IN PM       . pantoprazole (PROTONIX) 40 MG tablet Take 1 tablet (40 mg total) by mouth daily.  90 tablet  3   Allergies  Allergen Reactions  . Penicillins    ROS: Denies nausea, vomiting, diarrhea, sinus pain, cough, shortness of breath or chest pain  PHYSICAL EXAM: BP 130/100  Pulse 90  Temp(Src) 98.2 F (36.8 C) (Oral)  Resp 14  Wt 175 lb (79.379 kg)  SpO2  97% Well developed female in no distress HEENT:  PERRL, EOMI, conjunctiva clear. TM's and EAC's normal bilaterally, only small amount of cerumen in L ear, not obstructing. OP clear without erythema, mucus membranes moist. Neck: no lymphadenopathy, thyromegaly or mass Heart: regular rate and rhythm without murmur Lungs: clear bilaterally Somewhat unusual affect.  Pleasant Skin: no rash  ASSESSMENT/PLAN:  1. Otalgia of both ears   2. Sweating    Reassured no evidence of influenza or other active infection.  She will discuss her symptoms (mainly increased sweating and fatigue) with her psychiatrist, Dr. Jennelle Human.  She believes he wanted labs drawn, but didn't bring any orders.  Chart reviewed.  Lipids UTD.  She is due to see Dr. Donnie Coffin who will also be doing labs.  Advised her we can draw labs here at a lab visit if Dr. Jennelle Human sends over orders

## 2011-12-09 NOTE — Patient Instructions (Signed)
There is no evidence of infection on your exam today.  Increased sweating and fatigue (your main complaints) may be due to side effects from your medications.  I couldn't draw labs today without orders from Dr. Carie Caddy him fax over orders (labs and diagnosis codes) and you can schedule lab visit here to have them drawn.

## 2011-12-11 ENCOUNTER — Other Ambulatory Visit: Payer: Self-pay | Admitting: Family Medicine

## 2011-12-12 ENCOUNTER — Ambulatory Visit (HOSPITAL_BASED_OUTPATIENT_CLINIC_OR_DEPARTMENT_OTHER): Payer: BC Managed Care – PPO | Admitting: Oncology

## 2011-12-12 DIAGNOSIS — Z79811 Long term (current) use of aromatase inhibitors: Secondary | ICD-10-CM

## 2011-12-12 DIAGNOSIS — C50919 Malignant neoplasm of unspecified site of unspecified female breast: Secondary | ICD-10-CM

## 2011-12-12 DIAGNOSIS — E559 Vitamin D deficiency, unspecified: Secondary | ICD-10-CM

## 2011-12-12 NOTE — Progress Notes (Signed)
Hematology and Oncology Follow Up Visit  Brandi [name] [id number] 1956/07/06 55 y.o. 12/12/2011 3:38 PM PCP dr Carley Hammed knapp; dr cottle  Principle Diagnosis: T1cNo breast cancer s/p lumpectomy and xrt completed 6/10, on arimidex.  Interim History:  There have been no intercurrent illness, hospitalizations or medication changes. Started latuda, new antipsychotic  Medications: I have reviewed the patient's current medications.  Allergies:  Allergies  Allergen Reactions  . Penicillins     Past Medical History, Surgical history, Social history, and Family History were reviewed and updated.  Review of Systems: Constitutional:  Negative for fever, chills, night sweats, anorexia, weight loss, pain. Cardiovascular: negative Respiratory: no cough, shortness of breath, or wheezing Neurological: negative Dermatological: negative ENT: negative Skin Gastrointestinal: no abdominal pain, change in bowel habits, or black or bloody stools Genito-Urinary: no dysuria, trouble voiding, or hematuria Hematological and Lymphatic: negative Breast: negative for breast lumps Musculoskeletal: negative, some jt pains recently. Remaining ROS negative.  Physical Exam: Blood pressure 126/92, pulse 97, temperature 98.4 F (36.9 C), height 5' 4.11" (1.628 m), weight 188 lb 3.2 oz (85.367 kg). ECOG: 1 General appearance: alert, cooperative and appears stated age Head: Normocephalic, without obvious abnormality, atraumatic Neck: no adenopathy, no carotid bruit, no JVD, supple, symmetrical, trachea midline and thyroid not enlarged, symmetric, no tenderness/mass/nodules Lymph nodes: Cervical, supraclavicular, and axillary nodes normal. Cardiac : regular rate and rhythm, no murmurs or gallops Pulmonary:clear to auscultation bilaterally and normal percussion bilaterally Breasts: inspection negative, no nipple discharge or bleeding, no masses or nodularity palpable lt breast is slightly distorted without  masses, both axillae are negative. Abdomen:soft, non-tender; bowel sounds normal; no masses,  no organomegaly Extremities negative Neuro: alert, oriented, normal speech, no focal findings or movement disorder noted  Lab Results: Lab Results  Component Value Date   WBC 8.2 05/21/2011   HGB 12.1 05/21/2011   HCT 37.5 05/21/2011   MCV 78.8* 05/21/2011   PLT 373 05/21/2011     Chemistry      Component Value Date/Time   NA 142 05/21/2011 1622   K 3.4* 05/21/2011 1622   CL 103 05/21/2011 1622   CO2 29 05/21/2011 1622   BUN 9 05/21/2011 1622   CREATININE 0.70 05/21/2011 1622      Component Value Date/Time   CALCIUM 9.5 05/21/2011 1622   ALKPHOS 183* 05/21/2011 1622   AST 21 05/21/2011 1622   ALT 24 05/21/2011 1622   BILITOT 0.1* 05/21/2011 1622      .pathology. Radiological Studies: chest X-ray n/a Mammogram 4/13-due Bone density Due 4/13  Impression and Plan: Doing well from a breast cancer of point of view. I will see her in 6 months.  More than 50% of the visit was spent in patient-related counselling   Pierce Crane, MD 12/27/20123:38 PM

## 2011-12-20 ENCOUNTER — Other Ambulatory Visit: Payer: Self-pay | Admitting: Oncology

## 2011-12-20 DIAGNOSIS — C50219 Malignant neoplasm of upper-inner quadrant of unspecified female breast: Secondary | ICD-10-CM

## 2012-01-08 ENCOUNTER — Other Ambulatory Visit: Payer: Self-pay | Admitting: Family Medicine

## 2012-02-20 ENCOUNTER — Encounter: Payer: Self-pay | Admitting: Family Medicine

## 2012-02-20 ENCOUNTER — Ambulatory Visit (INDEPENDENT_AMBULATORY_CARE_PROVIDER_SITE_OTHER): Payer: BC Managed Care – PPO | Admitting: Family Medicine

## 2012-02-20 VITALS — BP 114/74 | HR 111 | Temp 98.0°F | Wt 188.0 lb

## 2012-02-20 DIAGNOSIS — J069 Acute upper respiratory infection, unspecified: Secondary | ICD-10-CM

## 2012-02-20 NOTE — Patient Instructions (Addendum)
You can use NyQuil at night and you can also use Afrin nasal spray so you can breathe at night. If you are not better by late next week, callUpper Respiratory Infection, Adult An upper respiratory infection (URI) is also known as the common cold. It is often caused by a type of germ (virus). Colds are easily spread (contagious). You can pass it to others by kissing, coughing, sneezing, or drinking out of the same glass. Usually, you get better in 1 or 2 weeks.  HOME CARE   Only take medicine as told by your doctor.   Use a warm mist humidifier or breathe in steam from a hot shower.   Drink enough water and fluids to keep your pee (urine) clear or pale yellow.   Get plenty of rest.   Return to work when your temperature is back to normal or as told by your doctor. You may use a face mask and wash your hands to stop your cold from spreading.  GET HELP RIGHT AWAY IF:   After the first few days, you feel you are getting worse.   You have questions about your medicine.   You have chills, shortness of breath, or brown or red spit (mucus).   You have yellow or brown snot (nasal discharge) or pain in the face, especially when you bend forward.   You have a fever, puffy (swollen) neck, pain when you swallow, or white spots in the back of your throat.   You have a bad headache, ear pain, sinus pain, or chest pain.   You have a high-pitched whistling sound when you breathe in and out (wheezing).   You have a lasting cough or cough up blood.   You have sore muscles or a stiff neck.  MAKE SURE YOU:   Understand these instructions.   Will watch your condition.   Will get help right away if you are not doing well or get worse.  Document Released: 05/20/2008 Document Revised: 11/21/2011 Document Reviewed: 04/08/2011 [facility] Patient Information 2012 [place], [place].

## 2012-02-20 NOTE — Progress Notes (Signed)
  Subjective:    Patient ID: Brandi [name], female    DOB: 1956-09-12, 56 y.o.   MRN: [id number]  HPI She complains of a four-day history of sore throat, nasal congestion, PND with coughing. Her daughter recently had similar symptoms.   Review of Systems     Objective:   Physical Exam alert and in no distress. Tympanic membranes not seen due to cerumen in both canals. Throat is clear. Tonsils are normal. Neck is supple without adenopathy or thyromegaly. Cardiac exam shows a regular sinus rhythm without murmurs or gallops. Lungs are clear to auscultation.        Assessment & Plan:  URI. Supportive care including NyQuil and Afrin nasal spray. She is to call next week if no improvement.

## 2012-02-21 ENCOUNTER — Telehealth: Payer: Self-pay | Admitting: Internal Medicine

## 2012-02-21 NOTE — Telephone Encounter (Signed)
Explain to him that she probably has a viral infection and give her through the weekend. If she is still having difficulty, we'll put her on an antibiotic then

## 2012-02-21 NOTE — Telephone Encounter (Signed)
Left message word for word  

## 2012-02-24 ENCOUNTER — Telehealth: Payer: Self-pay | Admitting: Family Medicine

## 2012-02-24 MED ORDER — AZITHROMYCIN 500 MG PO TABS: 500.0000 mg | ORAL_TABLET | Freq: Every day | ORAL | Status: AC

## 2012-02-24 NOTE — Telephone Encounter (Signed)
Brandi [name] is no better, you ask she wait through the weekend and see.  She is requesting antibiotic to [facility].

## 2012-02-24 NOTE — Telephone Encounter (Signed)
Azithromycin called in for continued difficulty with cough and congestion

## 2012-03-02 ENCOUNTER — Telehealth: Payer: Self-pay | Admitting: Internal Medicine

## 2012-03-02 MED ORDER — ATORVASTATIN CALCIUM 20 MG PO TABS: 20.0000 mg | ORAL_TABLET | Freq: Every day | ORAL | Status: DC

## 2012-03-02 NOTE — Telephone Encounter (Signed)
[facility] supply [facility] at[facility]vastatin 20mg  for pt

## 2012-07-10 ENCOUNTER — Other Ambulatory Visit: Payer: BC Managed Care – PPO

## 2012-07-10 ENCOUNTER — Ambulatory Visit: Payer: BC Managed Care – PPO | Admitting: Oncology

## 2012-08-04 ENCOUNTER — Other Ambulatory Visit: Payer: Self-pay | Admitting: Oncology

## 2012-08-04 DIAGNOSIS — Z853 Personal history of malignant neoplasm of breast: Secondary | ICD-10-CM

## 2012-09-01 ENCOUNTER — Encounter: Payer: Self-pay | Admitting: Internal Medicine

## 2012-09-08 ENCOUNTER — Telehealth: Payer: Self-pay | Admitting: *Deleted

## 2012-09-08 NOTE — Telephone Encounter (Signed)
per md reschedule patient for 10-05-2012 starting at 2:00pm patient confirmed over the phone the new date and time

## 2012-09-10 ENCOUNTER — Encounter: Payer: BC Managed Care – PPO | Admitting: Family Medicine

## 2012-09-15 ENCOUNTER — Encounter: Payer: Self-pay | Admitting: Family Medicine

## 2012-09-15 ENCOUNTER — Ambulatory Visit (INDEPENDENT_AMBULATORY_CARE_PROVIDER_SITE_OTHER): Payer: BC Managed Care – PPO | Admitting: Family Medicine

## 2012-09-15 VITALS — BP 128/80 | HR 103 | Ht 60.0 in | Wt 181.0 lb

## 2012-09-15 DIAGNOSIS — J301 Allergic rhinitis due to pollen: Secondary | ICD-10-CM

## 2012-09-15 DIAGNOSIS — Z0000 Encounter for general adult medical examination without abnormal findings: Secondary | ICD-10-CM

## 2012-09-15 DIAGNOSIS — F329 Major depressive disorder, single episode, unspecified: Secondary | ICD-10-CM

## 2012-09-15 DIAGNOSIS — K219 Gastro-esophageal reflux disease without esophagitis: Secondary | ICD-10-CM

## 2012-09-15 DIAGNOSIS — Z853 Personal history of malignant neoplasm of breast: Secondary | ICD-10-CM

## 2012-09-15 DIAGNOSIS — Z23 Encounter for immunization: Secondary | ICD-10-CM

## 2012-09-15 DIAGNOSIS — G43909 Migraine, unspecified, not intractable, without status migrainosus: Secondary | ICD-10-CM

## 2012-09-15 DIAGNOSIS — E785 Hyperlipidemia, unspecified: Secondary | ICD-10-CM

## 2012-09-15 DIAGNOSIS — E669 Obesity, unspecified: Secondary | ICD-10-CM

## 2012-09-15 DIAGNOSIS — N390 Urinary tract infection, site not specified: Secondary | ICD-10-CM

## 2012-09-15 LAB — COMPREHENSIVE METABOLIC PANEL
AST: 29 U/L (ref 0–37)
Alkaline Phosphatase: 164 U/L — ABNORMAL HIGH (ref 39–117)
BUN: 11 mg/dL (ref 6–23)
Creat: 0.81 mg/dL (ref 0.50–1.10)
Potassium: 4 mEq/L (ref 3.5–5.3)
Total Bilirubin: 0.4 mg/dL (ref 0.3–1.2)

## 2012-09-15 LAB — POCT URINALYSIS DIPSTICK
Bilirubin, UA: NEGATIVE
Blood, UA: 50
Glucose, UA: NEGATIVE
Spec Grav, UA: 1.025
Urobilinogen, UA: NEGATIVE

## 2012-09-15 LAB — CBC WITH DIFFERENTIAL/PLATELET
Basophils Absolute: 0 10*3/uL (ref 0.0–0.1)
Basophils Relative: 0 % (ref 0–1)
Eosinophils Absolute: 0.3 10*3/uL (ref 0.0–0.7)
Eosinophils Relative: 3 % (ref 0–5)
HCT: 34 % — ABNORMAL LOW (ref 36.0–46.0)
MCH: 23.3 pg — ABNORMAL LOW (ref 26.0–34.0)
MCHC: 32.6 g/dL (ref 30.0–36.0)
Monocytes Absolute: 0.6 10*3/uL (ref 0.1–1.0)
Neutro Abs: 7.2 10*3/uL (ref 1.7–7.7)
RBC: 4.77 MIL/uL (ref 3.87–5.11)
RDW: 17.4 % — ABNORMAL HIGH (ref 11.5–15.5)
WBC: 10.8 10*3/uL — ABNORMAL HIGH (ref 4.0–10.5)

## 2012-09-15 LAB — LIPID PANEL
HDL: 68 mg/dL (ref >39)
LDL Cholesterol: 73 mg/dL (ref 0–99)
Total CHOL/HDL Ratio: 2.6 Ratio
VLDL: 36 mg/dL (ref 0–40)

## 2012-09-15 NOTE — Progress Notes (Signed)
  Subjective:    Patient ID: Brandi [name], female    DOB: 1956/07/19, 56 y.o.   MRN: [id number]  HPI She is here for a complete exam. She continues on medications listed in the chart. She does have difficulty with migraine headaches usually during the allergy season. She continues to be followed by Dr. Jennelle Human. She recently had a UTI. She continues on Protonix and notes that if she misses a day she does have symptoms. She is scheduled to see her oncologist in late October. Her allergies seem to be well-controlled. Her physical activity is quite limited.  Review of Systems Negative except as above    Objective:   Physical Exam BP 128/80  Pulse 103  Ht 5' (1.524 m)  Wt 181 lb (82.101 kg)  BMI 35.35 kg/m2  General Appearance:    Alert, cooperative, no distress, appears stated age  Head:    Normocephalic, without obvious abnormality, atraumatic  Eyes:    PERRL, conjunctiva/corneas clear, EOM's intact, fundi    benign  Ears:    Normal TM's and external ear canals  Nose:   Nares normal, mucosa normal, no drainage or sinus   tenderness  Throat:   Lips, mucosa, and tongue normal; teeth and gums normal  Neck:   Supple, no lymphadenopathy;  thyroid:  no   enlargement/tenderness/nodules; no carotid   bruit or JVD  Back:    Spine nontender, no curvature, ROM normal, no CVA     tenderness  Lungs:     Clear to auscultation bilaterally without wheezes, rales or     ronchi; respirations unlabored  Chest Wall:    No tenderness or deformity   Heart:    Regular rate and rhythm, S1 and S2 normal, no murmur, rub   or gallop  Breast Exam:    Deferred to GYN  Abdomen:     Soft, non-tender, nondistended, normoactive bowel sounds,    no masses, no hepatosplenomegaly  Genitalia:    Deferred to GYN     Extremities:   No clubbing, cyanosis or edema  Pulses:   2+ and symmetric all extremities  Skin:   Skin color, texture, turgor normal, no rashes or lesions  Lymph nodes:   Cervical, supraclavicular, and  axillary nodes normal  Neurologic:   CNII-XII intact, normal strength, sensation and gait; reflexes 2+ and symmetric throughout          Psych:   Normal mood, affect, hygiene and grooming.    Urinalysis did show white cells.       Assessment & Plan:   1. Routine general medical examination at a health care facility  POCT Urinalysis Dipstick, CBC with Differential, Comprehensive metabolic panel, Lipid panel, Hepatitis C antibody  2. Need for prophylactic vaccination and inoculation against influenza  Flu vaccine greater than or equal to 3yo preservative free IM  3. Obesity (BMI 30-39.9)    4. Migraine headache    5. Major depression    6. Hyperlipidemia LDL goal < 100    7. GERD (gastroesophageal reflux disease)    8. Allergic rhinitis due to pollen    9. History of breast cancer    10. UTI (lower urinary tract infection)     she left before I could evaluate her urine. We will check with her on which medication she is on and followup appropriately.

## 2012-09-16 ENCOUNTER — Telehealth: Payer: Self-pay | Admitting: Family Medicine

## 2012-09-16 MED ORDER — CIPROFLOXACIN HCL 500 MG PO TABS: 500.0000 mg | ORAL_TABLET | Freq: Two times a day (BID) | ORAL | Status: DC

## 2012-09-16 NOTE — Telephone Encounter (Signed)
Pt called wanting status of rx. cipro 500 bid # [phone] called into [facility]. Spoke to [employer] he approved cipro 500 bid.

## 2012-09-17 ENCOUNTER — Ambulatory Visit: Payer: BC Managed Care – PPO | Admitting: Oncology

## 2012-09-17 ENCOUNTER — Other Ambulatory Visit: Payer: BC Managed Care – PPO | Admitting: Lab

## 2012-09-23 ENCOUNTER — Encounter: Payer: Self-pay | Admitting: Family Medicine

## 2012-09-25 ENCOUNTER — Telehealth: Payer: Self-pay | Admitting: *Deleted

## 2012-09-25 NOTE — Telephone Encounter (Signed)
Left voice message to inform the patient of the new date and time 10-22-2012 starting at 2:00pm

## 2012-09-28 ENCOUNTER — Telehealth: Payer: Self-pay | Admitting: *Deleted

## 2012-09-28 NOTE — Telephone Encounter (Signed)
Mailed out calendar to inform the patient of the new date and time 

## 2012-10-02 ENCOUNTER — Ambulatory Visit: Payer: BC Managed Care – PPO | Admitting: Oncology

## 2012-10-05 ENCOUNTER — Other Ambulatory Visit: Payer: BC Managed Care – PPO | Admitting: Lab

## 2012-10-05 ENCOUNTER — Ambulatory Visit: Payer: BC Managed Care – PPO | Admitting: Oncology

## 2012-10-09 ENCOUNTER — Ambulatory Visit: Payer: Self-pay | Admitting: Family Medicine

## 2012-10-12 ENCOUNTER — Ambulatory Visit (INDEPENDENT_AMBULATORY_CARE_PROVIDER_SITE_OTHER): Payer: BC Managed Care – PPO | Admitting: Family Medicine

## 2012-10-12 VITALS — BP 120/82 | HR 78

## 2012-10-12 DIAGNOSIS — N200 Calculus of kidney: Secondary | ICD-10-CM

## 2012-10-12 NOTE — Progress Notes (Signed)
  Subjective:    Patient ID: Brandi [name], female    DOB: 1956-05-10, 56 y.o.   MRN: [id number]  HPI Is here for followup. She had the onset of back pain and was seen in another emergency room. Apparently it did show kidney stone. She was given Flomax and oxycodone. When asked her were exactly she felt pain she could not tell me. She does state that now she is having some lower abdominal discomfort but again very vague on her symptoms.   Review of Systems     Objective:   Physical Exam Alert and in no distress but has a flat affect and does have some extrapyramidal facial movements. Abdominal exam shows no masses or tenderness. Review of CT scan did show a 6 mm calculus at the vesicoureteral junction       Assessment & Plan:   1. Renal stone    She is to continue on her present medication regimen and drink plain fluids. I will have her call me Friday if she is made no improvement for possible referral to urology.

## 2012-10-12 NOTE — Patient Instructions (Signed)
Make sure you drink plenty of fluids and keep yourself well hydrated

## 2012-10-13 ENCOUNTER — Encounter: Payer: Self-pay | Admitting: Family Medicine

## 2012-10-15 ENCOUNTER — Telehealth: Payer: Self-pay

## 2012-10-15 NOTE — Telephone Encounter (Signed)
Pt called to see if you received results about kidney stone please advise

## 2012-10-15 NOTE — Telephone Encounter (Signed)
If she still having difficulty, she will need to be reevaluated

## 2012-10-15 NOTE — Telephone Encounter (Signed)
PT ADVISED OF WORD FOR WORD AND WILL GIVE IT A FEW MORE DAYS SHE IS NOT HURTING BAD

## 2012-10-16 ENCOUNTER — Telehealth: Payer: Self-pay

## 2012-10-16 ENCOUNTER — Other Ambulatory Visit: Payer: Self-pay

## 2012-10-16 ENCOUNTER — Telehealth: Payer: Self-pay | Admitting: Internal Medicine

## 2012-10-16 MED ORDER — OXYCODONE-ACETAMINOPHEN 5-325 MG PO TABS: 1.0000 | ORAL_TABLET | ORAL | Status: DC | PRN

## 2012-10-16 NOTE — Telephone Encounter (Signed)
Husband called and wanted to know how long should Brandi [name] stay on flomax and if you could call her in some more hydrocodone she is still having some pain please  advise

## 2012-10-16 NOTE — Telephone Encounter (Signed)
cheri called husband and notified he has to come by and pick it up

## 2012-10-16 NOTE — Telephone Encounter (Signed)
Husband advised and verbalized understanding

## 2012-10-16 NOTE — Telephone Encounter (Signed)
Go ahead and if her some more codeine and continue the Flomax. If she still having pain on Monday, have her return here for followup visit.

## 2012-10-16 NOTE — Telephone Encounter (Signed)
Called in med per jcl 

## 2012-10-19 ENCOUNTER — Ambulatory Visit: Payer: BC Managed Care – PPO | Admitting: Medical

## 2012-10-22 ENCOUNTER — Telehealth: Payer: Self-pay | Admitting: Oncology

## 2012-10-22 ENCOUNTER — Other Ambulatory Visit (HOSPITAL_BASED_OUTPATIENT_CLINIC_OR_DEPARTMENT_OTHER): Payer: BC Managed Care – PPO | Admitting: Lab

## 2012-10-22 ENCOUNTER — Ambulatory Visit (HOSPITAL_BASED_OUTPATIENT_CLINIC_OR_DEPARTMENT_OTHER): Payer: BC Managed Care – PPO | Admitting: Oncology

## 2012-10-22 VITALS — BP 127/95 | HR 116 | Temp 99.0°F | Resp 20 | Ht 60.0 in | Wt 183.0 lb

## 2012-10-22 DIAGNOSIS — C50919 Malignant neoplasm of unspecified site of unspecified female breast: Secondary | ICD-10-CM

## 2012-10-22 DIAGNOSIS — E559 Vitamin D deficiency, unspecified: Secondary | ICD-10-CM

## 2012-10-22 DIAGNOSIS — D649 Anemia, unspecified: Secondary | ICD-10-CM

## 2012-10-22 DIAGNOSIS — C50219 Malignant neoplasm of upper-inner quadrant of unspecified female breast: Secondary | ICD-10-CM

## 2012-10-22 DIAGNOSIS — R5381 Other malaise: Secondary | ICD-10-CM

## 2012-10-22 LAB — CBC WITH DIFFERENTIAL/PLATELET
BASO%: 0.5 % (ref 0.0–2.0)
EOS%: 1.8 % (ref 0.0–7.0)
HCT: 34.1 % — ABNORMAL LOW (ref 34.8–46.6)
LYMPH%: 25.3 % (ref 14.0–49.7)
MCH: 23.2 pg — ABNORMAL LOW (ref 25.1–34.0)
MCHC: 32 g/dL (ref 31.5–36.0)
MONO%: 5.5 % (ref 0.0–14.0)
NEUT%: 66.9 % (ref 38.4–76.8)
Platelets: 513 10*3/uL — ABNORMAL HIGH (ref 145–400)
RBC: 4.72 10*6/uL (ref 3.70–5.45)
WBC: 8.5 10*3/uL (ref 3.9–10.3)

## 2012-10-22 LAB — COMPREHENSIVE METABOLIC PANEL (CC13)
ALT: 53 U/L (ref 0–55)
AST: 36 U/L — ABNORMAL HIGH (ref 5–34)
Creatinine: 0.9 mg/dL (ref 0.6–1.1)
Total Bilirubin: 0.44 mg/dL (ref 0.20–1.20)

## 2012-10-22 NOTE — Telephone Encounter (Signed)
gve the pt her may 2014 appt calendar 

## 2012-10-22 NOTE — Progress Notes (Signed)
Hematology and Oncology Follow Up Visit  Brandi [name] [id number] 1956/12/12 57 y.o. 10/22/2012 3:09 PM PCP dr Carley Hammed knapp; dr cottle  Principle Diagnosis: T1cNo breast cancer s/p lumpectomy and xrt completed 6/10, on arimidex.  Interim History:  There have been no intercurrent illness, hospitalizations or medication changes. Started latuda, new antipsychotic She is been diagnosed recently with a kidney stone. She feels otherwise well maybe a little fatigue. She is tolerating Arimidex well. Her last mammogram was done in April 2013 and was within normal limits. Medications: I have reviewed the patient's current medications.  Allergies:  Allergies  Allergen Reactions  . Penicillins     Past Medical History, Surgical history, Social history, and Family History were reviewed and updated.  Review of Systems: Constitutional:  Negative for fever, chills, night sweats, anorexia, weight loss, pain. Cardiovascular: negative Respiratory: no cough, shortness of breath, or wheezing Neurological: negative Dermatological: negative ENT: negative Skin Gastrointestinal: no abdominal pain, change in bowel habits, or black or bloody stools Genito-Urinary: no dysuria, trouble voiding, or hematuria Hematological and Lymphatic: negative Breast: negative for breast lumps Musculoskeletal: negative, some jt pains recently. Remaining ROS negative.  Physical Exam: Blood pressure 127/95, pulse 116, temperature 99 F (37.2 C), resp. rate 20, height 5' (1.524 m), weight 183 lb (83.008 kg). ECOG: 1 General appearance: alert, cooperative and appears stated age Head: Normocephalic, without obvious abnormality, atraumatic Neck: no adenopathy, no carotid bruit, no JVD, supple, symmetrical, trachea midline and thyroid not enlarged, symmetric, no tenderness/mass/nodules Lymph nodes: Cervical, supraclavicular, and axillary nodes normal. Cardiac : regular rate and rhythm, no murmurs or gallops Pulmonary:clear  to auscultation bilaterally and normal percussion bilaterally Breasts: inspection negative, no nipple discharge or bleeding, no masses or nodularity palpable lt breast is slightly distorted without masses, both axillae are negative. Abdomen:soft, non-tender; bowel sounds normal; no masses,  no organomegaly Extremities negative Neuro: alert, oriented, normal speech, no focal findings or movement disorder noted  Lab Results: Lab Results  Component Value Date   WBC 8.5 10/22/2012   HGB 10.9* 10/22/2012   HCT 34.1* 10/22/2012   MCV 72.4* 10/22/2012   PLT 513* 10/22/2012     Chemistry      Component Value Date/Time   NA 140 10/22/2012 1343   NA 142 09/15/2012 0956   K 3.6 10/22/2012 1343   K 4.0 09/15/2012 0956   CL 103 10/22/2012 1343   CL 99 09/15/2012 0956   CO2 28 10/22/2012 1343   CO2 28 09/15/2012 0956   BUN 7.0 10/22/2012 1343   BUN 11 09/15/2012 0956   CREATININE 0.9 10/22/2012 1343   CREATININE 0.81 09/15/2012 0956   CREATININE 0.70 05/21/2011 1622      Component Value Date/Time   CALCIUM 10.0 10/22/2012 1343   CALCIUM 10.4 09/15/2012 0956   ALKPHOS 169* 10/22/2012 1343   ALKPHOS 164* 09/15/2012 0956   AST 36* 10/22/2012 1343   AST 29 09/15/2012 0956   ALT 53 10/22/2012 1343   ALT 42* 09/15/2012 0956   BILITOT 0.44 10/22/2012 1343   BILITOT 0.4 09/15/2012 0956      .pathology. Radiological Studies: chest X-ray n/a Mammogram 4/13-due Bone density Due 4/13  Impression and Plan: Doing well from a breast cancer of point of view. I will see her in 6 months. Of note is that she is a moderately anemic today with a low MCV. Hemoglobin has been drifting down over a number of months. She has had a colonoscopy in 2011. She has a history of what  sounds like reflux. I think that a workup might be in order I will check iron stores today. She may benefit from having her stools checked for occult blood and may be referred back to a GI doctor as well.  More than 50% of the visit was spent in  patient-related counselling   Pierce Crane, MD 11/7/20133:09 PM

## 2012-10-23 LAB — VITAMIN D 25 HYDROXY (VIT D DEFICIENCY, FRACTURES): Vit D, 25-Hydroxy: 85 ng/mL (ref 30–89)

## 2012-10-26 ENCOUNTER — Ambulatory Visit (INDEPENDENT_AMBULATORY_CARE_PROVIDER_SITE_OTHER): Payer: BC Managed Care – PPO | Admitting: Family Medicine

## 2012-10-26 ENCOUNTER — Encounter: Payer: Self-pay | Admitting: Family Medicine

## 2012-10-26 VITALS — Wt 182.0 lb

## 2012-10-26 DIAGNOSIS — D649 Anemia, unspecified: Secondary | ICD-10-CM

## 2012-10-26 DIAGNOSIS — N200 Calculus of kidney: Secondary | ICD-10-CM

## 2012-10-26 LAB — POCT URINALYSIS DIPSTICK
Bilirubin, UA: NEGATIVE
Blood, UA: 250
Glucose, UA: NEGATIVE
Ketones, UA: NEGATIVE
Leukocytes, UA: NEGATIVE
pH, UA: 7

## 2012-10-26 NOTE — Progress Notes (Signed)
  Subjective:    Patient ID: Brandi [name], female    DOB: 1956/10/24, 56 y.o.   MRN: [id number]  HPI She is here for a recheck. She continues to complain of lower abdominal pain but again is very vague concerning this. She has had to take pain medications for this. She has had one episode of vomiting up a small amount of blood. She was recently seen by her oncologist. Hemoglobin was noted to be slightly low. She was placed on iron tablets for this.  Review of Systems     Objective:   Physical Exam Alert and in no distress. Urine dipstick did show red blood cells.       Assessment & Plan:   1. Renal stone  POCT Urinalysis Dipstick, Ambulatory referral to Urology  2. Anemia     she will followup with urology concerning her kidney stone. I will also have her return here in one month for recheck on her hemoglobin.

## 2012-11-09 ENCOUNTER — Encounter (HOSPITAL_BASED_OUTPATIENT_CLINIC_OR_DEPARTMENT_OTHER): Payer: Self-pay | Admitting: *Deleted

## 2012-11-09 ENCOUNTER — Other Ambulatory Visit: Payer: Self-pay | Admitting: Urology

## 2012-11-09 NOTE — Progress Notes (Signed)
NPO AFTER MN. ARRIVES AT 0615. NEEDS HG. WILL TAKE PROTONIX, LATUDA, VIMPAT, AND IF NEEDED OXYCODONE AM OF SURG W/ SIPS OF WATER.

## 2012-11-09 NOTE — H&P (Addendum)
History of Present Illness  Ms Bicking returns today for follow-up.  She still complains of moderate left lower quadrant pain.  She has not passed a stone.  I have received and reviewed the CD of CT.  She has bilateral non obstructing renal calculi and a 6 mm left distal ureteral calculus with mild hydronephrosis.  Since she is still symptomatic and has not passed the stone I believe she needs stone manipulation.   Past Medical History Problems  1. History of  Anemia 285.9 2. History of  Anxiety (Symptom) 300.00 3. History of  Depression 311 4. History of  Heartburn 787.1 5. History of  Hypercholesterolemia 272.0 6. History of  Seizure  Surgical History Problems  1. History of  Breast Surgery Lumpectomy Left 2. History of  Cesarean Section 3. History of  Cholecystectomy  Current Meds 1. Claritin CAPS; Therapy: (Recorded:14Nov2013) to 2. Effexor TABS; Therapy: (Recorded:14Nov2013) to 3. Latuda TABS; Therapy: (Recorded:14Nov2013) to 4. Lipitor TABS; Therapy: (Recorded:14Nov2013) to 5. LORazepam 0.5 MG Oral Tablet; Therapy: (Recorded:14Nov2013) to 6. Vimpat 100 MG Oral Tablet; Therapy: (Recorded:14Nov2013) to  Allergies Medication  1. Penicillins  Family History Problems  1. Paternal history of  Blood In Urine 2. Family history of  Breast Cancer V16.3 3. Family history of  Family Health Status - Mother's Age 61 4. Family history of  Family Health Status Number Of Children 1 daughter 5. Family history of  Father Deceased At Age 40 Lung ca 6. Sororal history of  Lung Cancer V16.1 7. Maternal history of  Nephrolithiasis  Social History Problems  1. Caffeine Use 1 2. Currently On Disability 3. Marital History - Currently Married 4. Never A Smoker Denied  5. History of  Alcohol Use  Review of Systems Genitourinary, constitutional, skin, eye, otolaryngeal, hematologic/lymphatic, cardiovascular, pulmonary, endocrine, musculoskeletal, gastrointestinal, neurological and  psychiatric system(s) were reviewed and pertinent findings if present are noted.  Gastrointestinal: abdominal pain.    Physical Exam Constitutional: Well nourished and well developed . No acute distress.  ENT:. The ears and nose are normal in appearance.  Neck: The appearance of the neck is normal and no neck mass is present.  Pulmonary: No respiratory distress and normal respiratory rhythm and effort.  Cardiovascular: Heart rate and rhythm are normal . No peripheral edema.  Abdomen: The abdomen is soft and nontender. No masses are palpated. No CVA tenderness. No hernias are palpable. No hepatosplenomegaly noted.  Genitourinary:  Chaperone Present: .  Examination of the external genitalia shows normal female external genitalia and no lesions. The urethra is normal in appearance and not tender. There is no urethral mass. Vaginal exam demonstrates no abnormalities. The adnexa are palpably normal. The bladder is non tender and not distended. The anus is normal on inspection. The perineum is normal on inspection.  Lymphatics: The femoral and inguinal nodes are not enlarged or tender.  Skin: Normal skin turgor, no visible rash and no visible skin lesions.  Neuro/Psych:. Mood and affect are appropriate.    Results/Data Urine [Data Includes: Last 1 Day]   25Nov2013  COLOR YELLOW   APPEARANCE CLEAR   SPECIFIC GRAVITY 1.025   pH 6.0   GLUCOSE NEG mg/dL  BILIRUBIN NEG   KETONE NEG mg/dL  BLOOD NEG   PROTEIN NEG mg/dL  UROBILINOGEN 0.2 mg/dL  NITRITE NEG   LEUKOCYTE ESTERASE SMALL   SQUAMOUS EPITHELIAL/HPF NONE SEEN   WBC 7-10 WBC/hpf  RBC NONE SEEN RBC/hpf  BACTERIA RARE   CRYSTALS NONE SEEN   CASTS NONE  SEEN    Assessment Assessed  1. Distal Ureteral Stone On The Left 592.1  Plan Distal Ureteral Stone On The Left (592.1)  1. Oxycodone-Acetaminophen 5-325 MG Oral Tablet; TAKE 1 TO 2 TABLETS EVERY 4 HOURS AS  NEEDED FOR PAIN; Therapy: 25Nov2013 to (Last Rx:25Nov2013) 2. Tamsulosin  HCl 0.4 MG Oral Capsule; TAKE 1 CAPSULE Bedtime; Therapy: 25Nov2013 to  (Evaluate:10Dec2013)  Requested for: 25Nov2013; Last Rx:25Nov2013; Edited Health Maintenance (V70.0)  3. UA With REFLEX  Done: 25Nov2013 10:20AM   Cystoscopy, left retrograde pyelogram, ureteroscopy, holmium laser ureteral stone, stone manipulation, JJ stent.  The procedure, risks, benefits were explained to the patient and her husband.  The risks include but are not limited to hemorrhage, infection, urteral injury, inability to extract the stone.  They understand and wish to proceed.   Signatures Electronically signed by : Su Grand, M.D.; Nov 09 2012 11:37AM

## 2012-11-10 ENCOUNTER — Encounter (HOSPITAL_BASED_OUTPATIENT_CLINIC_OR_DEPARTMENT_OTHER): Payer: Self-pay | Admitting: Anesthesiology

## 2012-11-10 ENCOUNTER — Encounter (HOSPITAL_BASED_OUTPATIENT_CLINIC_OR_DEPARTMENT_OTHER)
Admission: RE | Disposition: A | Discharge status: Home / Self Care | Payer: Self-pay | Source: Ambulatory Visit | Attending: Urology

## 2012-11-10 ENCOUNTER — Ambulatory Visit (HOSPITAL_BASED_OUTPATIENT_CLINIC_OR_DEPARTMENT_OTHER)
Admission: RE | Admit: 2012-11-10 | Discharge: 2012-11-10 | Disposition: A | Discharge status: Home / Self Care | Payer: BC Managed Care – PPO | Source: Ambulatory Visit | Attending: Urology | Admitting: Urology

## 2012-11-10 ENCOUNTER — Ambulatory Visit (HOSPITAL_BASED_OUTPATIENT_CLINIC_OR_DEPARTMENT_OTHER): Payer: BC Managed Care – PPO | Admitting: Anesthesiology

## 2012-11-10 ENCOUNTER — Encounter (HOSPITAL_BASED_OUTPATIENT_CLINIC_OR_DEPARTMENT_OTHER): Payer: Self-pay | Admitting: *Deleted

## 2012-11-10 DIAGNOSIS — N201 Calculus of ureter: Secondary | ICD-10-CM | POA: Insufficient documentation

## 2012-11-10 DIAGNOSIS — K219 Gastro-esophageal reflux disease without esophagitis: Secondary | ICD-10-CM | POA: Insufficient documentation

## 2012-11-10 DIAGNOSIS — E7800 Pure hypercholesterolemia, unspecified: Secondary | ICD-10-CM | POA: Insufficient documentation

## 2012-11-10 DIAGNOSIS — N1330 Unspecified hydronephrosis: Secondary | ICD-10-CM | POA: Insufficient documentation

## 2012-11-10 DIAGNOSIS — Z79899 Other long term (current) drug therapy: Secondary | ICD-10-CM | POA: Insufficient documentation

## 2012-11-10 HISTORY — DX: Schizophrenia, unspecified: F20.9

## 2012-11-10 HISTORY — DX: Other generalized epilepsy and epileptic syndromes, not intractable, without status epilepticus: G40.409

## 2012-11-10 HISTORY — DX: Anemia, unspecified: D64.9

## 2012-11-10 HISTORY — DX: Personal history of malignant neoplasm of breast: Z85.3

## 2012-11-10 HISTORY — DX: Allergic rhinitis, unspecified: J30.9

## 2012-11-10 HISTORY — PX: CYSTOSCOPY WITH RETROGRADE PYELOGRAM, URETEROSCOPY AND STENT PLACEMENT: SHX5789

## 2012-11-10 HISTORY — DX: Unspecified osteoarthritis, unspecified site: M19.90

## 2012-11-10 HISTORY — DX: Unspecified abdominal pain: R10.9

## 2012-11-10 HISTORY — PX: HOLMIUM LASER APPLICATION: SHX5852

## 2012-11-10 HISTORY — DX: Calculus of ureter: N20.1

## 2012-11-10 LAB — POCT HEMOGLOBIN-HEMACUE: Hemoglobin: 9.4 g/dL — ABNORMAL LOW (ref 12.0–15.0)

## 2012-11-10 SURGERY — CYSTOURETEROSCOPY, WITH RETROGRADE PYELOGRAM AND STENT INSERTION
Anesthesia: General | Site: Ureter | Laterality: Left | Wound class: Clean Contaminated

## 2012-11-10 MED ORDER — EPHEDRINE SULFATE 50 MG/ML IJ SOLN
INTRAMUSCULAR | Status: DC | PRN
  Administered 2012-11-10: 10 mg via INTRAVENOUS
  Administered 2012-11-10 (×2): 20 mg via INTRAVENOUS

## 2012-11-10 MED ORDER — GLYCOPYRROLATE 0.2 MG/ML IJ SOLN
INTRAMUSCULAR | Status: DC | PRN
  Administered 2012-11-10: 0.2 mg via INTRAVENOUS

## 2012-11-10 MED ORDER — PROPOFOL 10 MG/ML IV BOLUS
INTRAVENOUS | Status: DC | PRN
  Administered 2012-11-10: 200 mg via INTRAVENOUS

## 2012-11-10 MED ORDER — CIPROFLOXACIN IN D5W 400 MG/200ML IV SOLN
INTRAVENOUS | Status: DC | PRN
  Administered 2012-11-10: 400 mg via INTRAVENOUS

## 2012-11-10 MED ORDER — SODIUM CHLORIDE 0.9 % IR SOLN
Status: DC | PRN
  Administered 2012-11-10: 3000 mL

## 2012-11-10 MED ORDER — FENTANYL CITRATE 0.05 MG/ML IJ SOLN
INTRAMUSCULAR | Status: DC | PRN
  Administered 2012-11-10 (×2): 50 ug via INTRAVENOUS

## 2012-11-10 MED ORDER — CIPROFLOXACIN IN D5W 400 MG/200ML IV SOLN: INTRAVENOUS | Status: DC | PRN

## 2012-11-10 MED ORDER — CIPROFLOXACIN IN D5W 400 MG/200ML IV SOLN
400.0000 mg | INTRAVENOUS | Status: AC
  Administered 2012-11-10: 400 mg via INTRAVENOUS
  Filled 2012-11-10: qty 200

## 2012-11-10 MED ORDER — LACTATED RINGERS IV SOLN
INTRAVENOUS | Status: DC
  Filled 2012-11-10: qty 1000

## 2012-11-10 MED ORDER — LACTATED RINGERS IV SOLN
INTRAVENOUS | Status: DC | PRN
  Administered 2012-11-10 (×2): via INTRAVENOUS

## 2012-11-10 MED ORDER — LIDOCAINE HCL (CARDIAC) 20 MG/ML IV SOLN
INTRAVENOUS | Status: DC | PRN
  Administered 2012-11-10: 75 mg via INTRAVENOUS

## 2012-11-10 MED ORDER — IOHEXOL 350 MG/ML SOLN
INTRAVENOUS | Status: DC | PRN
  Administered 2012-11-10: 15 mL

## 2012-11-10 MED ORDER — FENTANYL CITRATE 0.05 MG/ML IJ SOLN
25.0000 ug | INTRAMUSCULAR | Status: DC | PRN
  Filled 2012-11-10: qty 1

## 2012-11-10 MED ORDER — ONDANSETRON HCL 4 MG/2ML IJ SOLN
INTRAMUSCULAR | Status: DC | PRN
  Administered 2012-11-10: 4 mg via INTRAVENOUS

## 2012-11-10 MED ORDER — PHENYLEPHRINE HCL 10 MG/ML IJ SOLN
20.0000 mg | INTRAVENOUS | Status: DC | PRN
  Administered 2012-11-10: 100 ug/min via INTRAVENOUS

## 2012-11-10 MED ORDER — PROMETHAZINE HCL 25 MG/ML IJ SOLN
6.2500 mg | INTRAMUSCULAR | Status: DC | PRN
  Administered 2012-11-10: 6.25 mg via INTRAVENOUS
  Filled 2012-11-10: qty 1

## 2012-11-10 MED ORDER — LACTATED RINGERS IV SOLN
INTRAVENOUS | Status: DC
  Administered 2012-11-10: 100 mL/h via INTRAVENOUS
  Filled 2012-11-10: qty 1000

## 2012-11-10 SURGICAL SUPPLY — 36 items
ADAPTER CATH URET PLST 4-6FR (CATHETERS) IMPLANT
ADPR CATH URET STRL DISP 4-6FR (CATHETERS)
BAG DRAIN URO-CYSTO SKYTR STRL (DRAIN) ×2 IMPLANT
BAG DRN UROCATH (DRAIN) ×1
BASKET LASER NITINOL 1.9FR (BASKET) IMPLANT
BASKET STNLS GEMINI 4WIRE 3FR (BASKET) IMPLANT
BASKET ZERO TIP NITINOL 2.4FR (BASKET) IMPLANT
BRUSH URET BIOPSY 3F (UROLOGICAL SUPPLIES) IMPLANT
BSKT STON RTRVL 120 1.9FR (BASKET)
BSKT STON RTRVL GEM 120X11 3FR (BASKET)
BSKT STON RTRVL ZERO TP 2.4FR (BASKET)
CANISTER SUCT LVC 12 LTR MEDI- (MISCELLANEOUS) ×1 IMPLANT
CATH INTERMIT  6FR 70CM (CATHETERS) IMPLANT
CATH URET 5FR 28IN CONE TIP (BALLOONS)
CATH URET 5FR 28IN OPEN ENDED (CATHETERS) IMPLANT
CATH URET 5FR 70CM CONE TIP (BALLOONS) IMPLANT
CLOTH BEACON ORANGE TIMEOUT ST (SAFETY) ×2 IMPLANT
DRAPE CAMERA CLOSED 9X96 (DRAPES) ×1 IMPLANT
GLOVE BIO SURGEON STRL SZ7 (GLOVE) ×2 IMPLANT
GLOVE INDICATOR 6.5 STRL GRN (GLOVE) ×2 IMPLANT
GUIDEWIRE 0.038 PTFE COATED (WIRE) ×2 IMPLANT
GUIDEWIRE ANG ZIPWIRE 038X150 (WIRE) IMPLANT
GUIDEWIRE STR DUAL SENSOR (WIRE) ×1 IMPLANT
IV NS IRRIG 3000ML ARTHROMATIC (IV SOLUTION) ×2 IMPLANT
KIT BALLIN UROMAX 15FX10 (LABEL) IMPLANT
KIT BALLN UROMAX 15FX4 (MISCELLANEOUS) IMPLANT
KIT BALLN UROMAX 26 75X4 (MISCELLANEOUS)
LASER FIBER DISP (UROLOGICAL SUPPLIES) ×1 IMPLANT
LASER FIBER DISP 1000U (UROLOGICAL SUPPLIES) IMPLANT
NS IRRIG 500ML POUR BTL (IV SOLUTION) ×1 IMPLANT
PACK CYSTOSCOPY (CUSTOM PROCEDURE TRAY) ×2 IMPLANT
SET HIGH PRES BAL DIL (LABEL)
SHEATH URET ACCESS 12FR/35CM (UROLOGICAL SUPPLIES) IMPLANT
SHEATH URET ACCESS 12FR/55CM (UROLOGICAL SUPPLIES) IMPLANT
SYR CONTROL 10ML LL (SYRINGE) ×1 IMPLANT
SYRINGE IRR TOOMEY STRL 70CC (SYRINGE) ×1 IMPLANT

## 2012-11-10 NOTE — Transfer of Care (Signed)
Immediate Anesthesia Transfer of Care Note  Patient: Brandi [name]  Procedure(s) Performed: Procedure(s) (LRB): CYSTOSCOPY WITH RETROGRADE PYELOGRAM, URETEROSCOPY AND STENT PLACEMENT (Left) HOLMIUM LASER APPLICATION (Left)  Patient Location: PACU  Anesthesia Type: General  Level of Consciousness: awake, sedated, patient cooperative and responds to stimulation  Airway & Oxygen Therapy: Patient Spontanous Breathing and Patient connected to face mask oxygen  Post-op Assessment: Report given to PACU RN, Post -op Vital signs reviewed and stable and Patient moving all extremities  Post vital signs: Reviewed and stable  Complications: No apparent anesthesia complications

## 2012-11-10 NOTE — Anesthesia Postprocedure Evaluation (Signed)
  Anesthesia Post-op Note  Patient: Brandi [name]  Procedure(s) Performed: Procedure(s) (LRB) with comments: CYSTOSCOPY WITH RETROGRADE PYELOGRAM, URETEROSCOPY AND STENT PLACEMENT (Left) - WITH DOUBLE J STENT HOLMIUM LASER APPLICATION (Left)  Patient Location: PACU  Anesthesia Type:General  Level of Consciousness: awake, alert  and oriented  Airway and Oxygen Therapy: Patient Spontanous Breathing  Post-op Pain: mild  Post-op Assessment: Post-op Vital signs reviewed  Post-op Vital Signs: stable  Complications: No apparent anesthesia complications and cardiovascular complications

## 2012-11-10 NOTE — Op Note (Signed)
Modifying factors: There are no other modifying factors  Associated signs and symptoms: There are no other associated signs and symptoms Aggravating and relieving factors: There are no other aggravating or relieving factors Severity: Moderate Duration: Persistent Brandi [name] is a 56 y.o.   11/10/2012  General  Pre-op diagnosis: Left distal ureteral calculus, mild left hydronephrosis  Postop diagnosis: Same  Procedure done: Cystoscopy, left retrograde pyelogram, ureteroscopy, holmium laser left distal ureteral calculus, stone extraction, and double-J stent insertion  Surgeon: Wendie Simmer. Maryln Eastham  Anesthesia: General  Indication: Patient is a 56 years old female who has been complaining of left the flank and left lower quadrant pain for about a 2-3 weeks. She was seen at [facility]. A CT scan showed bilateral nonobstructing renal calculi and a 6 mm stone in the left and distal ureter with mild hydronephrosis. She was treated with medical expulsive therapy. She has not passed the stone and has remained symptomatic. She is scheduled today for cystoscopy and stone manipulation.  Procedure: The patient was identified by her wrist band and proper timeout was taken.  Under general anesthesia she was prepped and draped and placed in the dorsolithotomy position. A panendoscope was inserted in the bladder. The bladder mucosa is normal. There is no stone or tumor in the bladder. The ureteral orifices are in normal position and shape.  Left retrograde pyelogram:  A cone-tip catheter was passed through the cystoscope and the left ureteral orifice. Contrast was then injected through the cone-tip catheter. There is a filling defect in the distal ureter at about 4 cm from the UVJ. The ureter proximal to the filling defect is mildly dilated. The cone-tip catheter was then removed.  A sensor wire was passed through the cystoscope and the left ureteral orifice but could not be advanced  beyond the filling defect. A glide wire was passed through a #6 [place] open-ended catheter. The open-ended catheter and the glide wire were passed through the cystoscope and the left ureteral orifice. The Glidewire was advanced in the mid ureter and then the open-ended catheter was advanced over the Glidewire. The Glidewire was then removed and replaced with a sensor wire. The bladder was then emptied and the cystoscope and the open-ended catheter were removed.  A semirigid ureteroscope was passed in the bladder and in the left distal ureter. The stone was visualized in the distal ureter. With a 365 microfiber holmium laser the stone was fragmented in multiple small stone fragments. The stone fragments were then removed with the Nitinol basket without difficulty. Some small stone fragments were dropped in the bladder.  Contrast was then injected through the ureteroscope and there was no evidence of extravasation of contrast. There is no filling defect in the ureter. The mid and proximal ureter, renal pelvis and calyces were mildly dilated. The ureteroscope was then removed.  The sensor wire was then backloaded into the cystoscope and a #6 French-24 double-J stent was passed over the sensor wire. The proximal curl of the double-J stent is in the renal pelvis, the distal curl is in the bladder. The string was left attached to the double-J stent. The stone fragments in the bladder were irrigated out with a [place] syringe.  The bladder was then emptied and the cystoscope removed.  The patient tolerated the procedure well and left the OR in satisfactory condition to postanesthesia care unit.

## 2012-11-10 NOTE — Anesthesia Preprocedure Evaluation (Signed)
Anesthesia Evaluation  Patient identified by MRN, date of birth, ID band Patient awake    Reviewed: Allergy & Precautions, H&P , NPO status , Patient's Chart, lab work & pertinent test results  Airway Mallampati: III TM Distance: >3 FB Neck ROM: Full  Mouth opening: Limited Mouth Opening  Dental  (+) Teeth Intact, Poor Dentition and Dental Advisory Given   Pulmonary neg pulmonary ROS,  breath sounds clear to auscultation  Pulmonary exam normal       Cardiovascular negative cardio ROS  Rhythm:Regular Rate:Normal     Neuro/Psych  Headaches, Seizures -,  Depression Schizo-affective disorder   GI/Hepatic Neg liver ROS, GERD-  Medicated,  Endo/Other  negative endocrine ROS  Renal/GU negative Renal ROS  negative genitourinary   Musculoskeletal negative musculoskeletal ROS (+)   Abdominal   Peds  Hematology negative hematology ROS (+)   Anesthesia Other Findings Hx of Breast CA; S/P lumpectomy/radiation  Reproductive/Obstetrics negative OB ROS                           Anesthesia Physical Anesthesia Plan  ASA: III  Anesthesia Plan: General   Post-op Pain Management:    Induction: Intravenous  Airway Management Planned: LMA  Additional Equipment:   Intra-op Plan:   Post-operative Plan: Extubation in OR  Informed Consent: I have reviewed the patients History and Physical, chart, labs and discussed the procedure including the risks, benefits and alternatives for the proposed anesthesia with the patient or authorized representative who has indicated his/her understanding and acceptance.     Plan Discussed with: CRNA  Anesthesia Plan Comments:         Anesthesia Quick Evaluation

## 2012-11-10 NOTE — Anesthesia Procedure Notes (Signed)
Procedure Name: LMA Insertion Date/Time: 11/10/2012 7:47 AM Performed by: Jessica Priest Pre-anesthesia Checklist: Patient identified, Emergency Drugs available, Suction available and Patient being monitored Patient Re-evaluated:Patient Re-evaluated prior to inductionOxygen Delivery Method: Circle System Utilized Preoxygenation: Pre-oxygenation with 100% oxygen Intubation Type: IV induction Ventilation: Mask ventilation without difficulty LMA: LMA inserted LMA Size: 4.0 Number of attempts: 1 Airway Equipment and Method: bite block Placement Confirmation: positive ETCO2 Tube secured with: Tape Dental Injury: Teeth and Oropharynx as per pre-operative assessment

## 2012-11-11 ENCOUNTER — Encounter (HOSPITAL_BASED_OUTPATIENT_CLINIC_OR_DEPARTMENT_OTHER): Payer: Self-pay | Admitting: Urology

## 2012-11-24 ENCOUNTER — Encounter: Payer: Self-pay | Admitting: Family Medicine

## 2012-11-24 ENCOUNTER — Ambulatory Visit (INDEPENDENT_AMBULATORY_CARE_PROVIDER_SITE_OTHER): Payer: BC Managed Care – PPO | Admitting: Family Medicine

## 2012-11-24 VITALS — BP 124/90 | HR 102 | Wt 174.0 lb

## 2012-11-24 DIAGNOSIS — D649 Anemia, unspecified: Secondary | ICD-10-CM

## 2012-11-24 LAB — CBC WITH DIFFERENTIAL/PLATELET
Basophils Relative: 0 % (ref 0–1)
Eosinophils Relative: 2 % (ref 0–5)
HCT: 38.4 % (ref 36.0–46.0)
Hemoglobin: 12.2 g/dL (ref 12.0–15.0)
MCHC: 31.8 g/dL (ref 30.0–36.0)
MCV: 75.4 fL — ABNORMAL LOW (ref 78.0–100.0)
Monocytes Absolute: 0.6 10*3/uL (ref 0.1–1.0)
Monocytes Relative: 7 % (ref 3–12)
Neutro Abs: 5.4 10*3/uL (ref 1.7–7.7)

## 2012-11-24 NOTE — Progress Notes (Signed)
  Subjective:    Patient ID: Brandi [name], female    DOB: March 31, 1956, 56 y.o.   MRN: [id number]  HPI She is here for followup visit. She has been on iron supplementation for least a month. She will need followup on this. Recently she has had difficulty with renal stones and did have her procedure done. At this time she is feeling much better.   Review of Systems     Objective:   Physical Exam Alert and in no distress otherwise not examined       Assessment & Plan:   1. Anemia  CBC with Differential

## 2012-11-25 NOTE — Progress Notes (Signed)
Quick Note:  CALLED PT HOME # LEFT MESSAGE BLOOD COUNT IS BETTER AND TO CONTINUE ON MULTI W/IRON ______

## 2012-11-26 ENCOUNTER — Other Ambulatory Visit: Payer: Self-pay | Admitting: Oncology

## 2012-11-26 DIAGNOSIS — C50919 Malignant neoplasm of unspecified site of unspecified female breast: Secondary | ICD-10-CM

## 2012-11-30 ENCOUNTER — Other Ambulatory Visit: Payer: Self-pay | Admitting: Family Medicine

## 2013-01-01 ENCOUNTER — Other Ambulatory Visit: Payer: Self-pay | Admitting: Family Medicine

## 2013-02-23 ENCOUNTER — Telehealth: Payer: Self-pay | Admitting: *Deleted

## 2013-02-23 NOTE — Telephone Encounter (Signed)
Left message for a return phone call to reschedule her appt.  Awaiting her response I have cancelled her appts.

## 2013-02-25 ENCOUNTER — Telehealth: Payer: Self-pay | Admitting: *Deleted

## 2013-02-25 ENCOUNTER — Encounter: Payer: Self-pay | Admitting: Oncology

## 2013-02-25 NOTE — Telephone Encounter (Signed)
Pt returned our call about rescheduling her appt.  Confirmed 04/22/13 appt w/ pt.  Mailed letter & calendar to pt.

## 2013-04-13 ENCOUNTER — Encounter: Payer: Self-pay | Admitting: Internal Medicine

## 2013-04-21 ENCOUNTER — Ambulatory Visit: Payer: BC Managed Care – PPO | Admitting: Oncology

## 2013-04-21 ENCOUNTER — Other Ambulatory Visit: Payer: BC Managed Care – PPO | Admitting: Lab

## 2013-04-22 ENCOUNTER — Telehealth: Payer: Self-pay | Admitting: *Deleted

## 2013-04-22 ENCOUNTER — Ambulatory Visit (HOSPITAL_BASED_OUTPATIENT_CLINIC_OR_DEPARTMENT_OTHER): Payer: BC Managed Care – PPO | Admitting: Oncology

## 2013-04-22 VITALS — BP 153/87 | HR 109 | Temp 98.0°F | Resp 20 | Ht 60.0 in | Wt 184.9 lb

## 2013-04-22 DIAGNOSIS — C50419 Malignant neoplasm of upper-outer quadrant of unspecified female breast: Secondary | ICD-10-CM

## 2013-04-22 DIAGNOSIS — Z170 Estrogen receptor positive status [ER+]: Secondary | ICD-10-CM

## 2013-04-22 DIAGNOSIS — C50919 Malignant neoplasm of unspecified site of unspecified female breast: Secondary | ICD-10-CM

## 2013-04-22 MED ORDER — ANASTROZOLE 1 MG PO TABS: 1.0000 mg | ORAL_TABLET | Freq: Every day | ORAL | Status: DC

## 2013-04-22 NOTE — Telephone Encounter (Signed)
appts made and printed...td 

## 2013-04-22 NOTE — Patient Instructions (Signed)
Continue taking arimidex  I will see you back in January 2015

## 2013-04-26 NOTE — Progress Notes (Signed)
OFFICE PROGRESS NOTE  CC  Carollee Herter, MD [address] [place] [address]  DIAGNOSIS: 57 year old female with history of stage I breast cancer diagnosed in 2010  PRIOR THERAPY:  #1 patient underwent a lumpectomy with sentinel lymph node biopsy of the left breast for a T1 C. No negative disease that was ER positive.  #2 she went on to complete radiation therapy June 2010. Thereafter she was begun on Irma ducts 1 mg daily.  CURRENT THERAPY:Arimidex 1 mg daily  INTERVAL HISTORY: Brandi [name] 57 y.o. female returns for followup visit today. Overall she's doing well she denies any fevers chills night sweats headaches no chest pains no palpitations no hot flashes. No aches or pains. Remainder of the 10 point review of systems is negative.  MEDICAL HISTORY: Past Medical History  Diagnosis Date  . Dyslipidemia   . Depression     MAJOR  . GERD (gastroesophageal reflux disease)   . Migraines   . Allergic rhinitis   . Schizophrenic disorder   . Tonic-clonic seizure disorder X1  2010--  NO SEIZURE SINCE  . History of breast cancer DX 2010--  S/P LUMPECTOMY AND RADIATION -- NO RECURRENCE    ONCOLOGIST- DR Donnie Coffin  . Anemia   . Left ureteral calculus   . Left flank pain   . Arthritis HX RIGHT ANKLE FX    ALLERGIES:  is allergic to penicillins.  MEDICATIONS:  Current Outpatient Prescriptions  Medication Sig Dispense Refill  . anastrozole (ARIMIDEX) 1 MG tablet Take 1 tablet (1 mg total) by mouth daily.  30 tablet  8  . atorvastatin (LIPITOR) 20 MG tablet TAKE 1 TABLET (20 MG TOTAL) BY MOUTH DAILY.  90 tablet  2  . calcium-vitamin D (OSCAL WITH D) 250-125 MG-UNIT per tablet Take 1 tablet by mouth daily.       . fish oil-omega-3 fatty acids 1000 MG capsule Take 2 g by mouth daily.       . Garlic 1000 MG CAPS Take 1,000 mg by mouth daily.       Marland Kitchen lacosamide (VIMPAT) 50 MG TABS Take 100 mg by mouth 2 (two) times daily.       Marland Kitchen levETIRAcetam (KEPPRA) 500 MG  tablet Take 500 mg by mouth 2 (two) times daily.       Marland Kitchen loratadine (CLARITIN) 10 MG tablet Take 10 mg by mouth daily.       Marland Kitchen LORazepam (ATIVAN) 0.5 MG tablet Take 0.5 mg by mouth every evening.       . lurasidone (LATUDA) 80 MG TABS Take 120 mg by mouth daily with breakfast. 120 MG  PER DAY      . magnesium 30 MG tablet Take 30 mg by mouth 2 (two) times daily.       . Multiple Vitamins-Minerals (MULTIVITAMIN WITH MINERALS) tablet Take 1 tablet by mouth daily.       Marland Kitchen oxyCODONE-acetaminophen (PERCOCET/ROXICET) 5-325 MG per tablet Take 1 tablet by mouth every 4 (four) hours as needed.  30 tablet  0  . pantoprazole (PROTONIX) 40 MG tablet TAKE 1 TABLET BY MOUTH ONCE A DAY  30 tablet  PRN  . psyllium (REGULOID) 0.52 G capsule Take 0.52 g by mouth daily. 3 TABS IN AM AND 3 TABS AT BED TIME      . SUMAtriptan (IMITREX) 100 MG tablet Take 100 mg by mouth every 2 (two) hours as needed.       . venlafaxine (EFFEXOR) 75 MG tablet Take 150 mg  by mouth 2 (two) times daily. ONE IN THE AM AND ONE IN PM      . Ginkgo Biloba 40 MG TABS Take 40 mg by mouth daily.       . Tamsulosin HCl (FLOMAX) 0.4 MG CAPS Take 0.4 mg by mouth daily.       No current facility-administered medications for this visit.    SURGICAL HISTORY:  Past Surgical History  Procedure Laterality Date  . Child birth      C-SECTION  . Breast surgery  01-04-2009  [facility]    LEFT BREAST LUMPECTOMY W/ SLN DISSECTION  FOR CANCER  . Cesarean section  1998  . Laparoscopic cholecystectomy  1990'S  . Cystoscopy with retrograde pyelogram, ureteroscopy and stent placement  11/10/2012    Procedure: CYSTOSCOPY WITH RETROGRADE PYELOGRAM, URETEROSCOPY AND STENT PLACEMENT;  Surgeon: Lindaann Slough, MD;  Location: [facility];  Service: Urology;  Laterality: Left;  WITH DOUBLE J STENT  . Holmium laser application  11/10/2012    Procedure: HOLMIUM LASER APPLICATION;  Surgeon: Lindaann Slough, MD;  Location: [facility] ;   Service: Urology;  Laterality: Left;    REVIEW OF SYSTEMS:  Pertinent items are noted in HPI.   HEALTH MAINTENANCE:  PHYSICAL EXAMINATION: Blood pressure 153/87, pulse 109, temperature 98 F (36.7 C), temperature source Oral, resp. rate 20, height 5' (1.524 m), weight 184 lb 14.4 oz (83.87 kg). Body mass index is 36.11 kg/(m^2). ECOG PERFORMANCE STATUS: 0 - Asymptomatic  Patient is well-developed well-nourished female in no acute distress HEENT exam EOMI PERRLA sclerae anicteric no conjunctival pallor oral causes moist neck is supple lungs are clear cardiovascular regular rate rhythm abdomen is soft nontender no HSM extremities no edema neuro patient's alert oriented otherwise nonfocal breast examination left breast no masses or nipple discharge well healed surgical scar right breast no masses or nipple discharge.   LABORATORY DATA: Lab Results  Component Value Date   WBC 8.7 11/24/2012   HGB 12.2 11/24/2012   HCT 38.4 11/24/2012   MCV 75.4* 11/24/2012   PLT 612* 11/24/2012      Chemistry      Component Value Date/Time   NA 140 10/22/2012 1343   NA 142 09/15/2012 0956   K 3.6 10/22/2012 1343   K 4.0 09/15/2012 0956   CL 103 10/22/2012 1343   CL 99 09/15/2012 0956   CO2 28 10/22/2012 1343   CO2 28 09/15/2012 0956   BUN 7.0 10/22/2012 1343   BUN 11 09/15/2012 0956   CREATININE 0.9 10/22/2012 1343   CREATININE 0.81 09/15/2012 0956   CREATININE 0.70 05/21/2011 1622      Component Value Date/Time   CALCIUM 10.0 10/22/2012 1343   CALCIUM 10.4 09/15/2012 0956   ALKPHOS 169* 10/22/2012 1343   ALKPHOS 164* 09/15/2012 0956   AST 36* 10/22/2012 1343   AST 29 09/15/2012 0956   ALT 53 10/22/2012 1343   ALT 42* 09/15/2012 0956   BILITOT 0.44 10/22/2012 1343   BILITOT 0.4 09/15/2012 0956       RADIOGRAPHIC STUDIES:  No results found.  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  Patient is status post radiation therapy. She is now receiving Arimidex 1 mg daily overall she is tolerating it well without any problems. Total of 5 years of therapy is planned   PLAN:   #1 continue Arimidex 1 mg daily.  #  2 I will see her back in 6 months time for followup.   All questions were answered. The patient knows to call the clinic with any problems, questions or concerns. We can certainly see the patient much sooner if necessary.  I spent 25 minutes counseling the patient face to face. The total time spent in the appointment was 30 minutes.    Drue Second, MD Medical/Oncology [facility] [phone] (beeper) [phone] (Office)

## 2013-06-20 ENCOUNTER — Inpatient Hospital Stay: Payer: Self-pay | Admitting: Internal Medicine

## 2013-06-20 LAB — COMPREHENSIVE METABOLIC PANEL
Albumin: 3.4 g/dL (ref 3.4–5.0)
Anion Gap: 11 (ref 7–16)
BUN: 9 mg/dL (ref 7–18)
Bilirubin,Total: 0.9 mg/dL (ref 0.2–1.0)
Chloride: 98 mmol/L (ref 98–107)
Creatinine: 0.69 mg/dL (ref 0.60–1.30)
EGFR (African American): >60
EGFR (Non-African Amer.): >60
Glucose: 165 mg/dL — ABNORMAL HIGH (ref 65–99)
Osmolality: 276 (ref 275–301)
Potassium: 2.9 mmol/L — ABNORMAL LOW (ref 3.5–5.1)
SGOT(AST): 98 U/L — ABNORMAL HIGH (ref 15–37)
Total Protein: 7.6 g/dL (ref 6.4–8.2)

## 2013-06-20 LAB — URINALYSIS, COMPLETE
Glucose,UR: NEGATIVE mg/dL (ref 0–75)
Ketone: NEGATIVE
Nitrite: NEGATIVE
Ph: 6 (ref 4.5–8.0)
RBC,UR: 16 /HPF (ref 0–5)
Specific Gravity: 1.012 (ref 1.003–1.030)
Squamous Epithelial: NONE SEEN
WBC UR: 127 /HPF (ref 0–5)

## 2013-06-20 LAB — CBC
HCT: 37 % (ref 35.0–47.0)
MCV: 80 fL (ref 80–100)
RBC: 4.63 10*6/uL (ref 3.80–5.20)
RDW: 14.8 % — ABNORMAL HIGH (ref 11.5–14.5)
WBC: 16.9 10*3/uL — ABNORMAL HIGH (ref 3.6–11.0)

## 2013-06-20 LAB — TROPONIN I: Troponin-I: <0.02 ng/mL

## 2013-06-21 LAB — BASIC METABOLIC PANEL
Anion Gap: 6 — ABNORMAL LOW (ref 7–16)
BUN: 9 mg/dL (ref 7–18)
Chloride: 107 mmol/L (ref 98–107)
Creatinine: 0.92 mg/dL (ref 0.60–1.30)
Glucose: 162 mg/dL — ABNORMAL HIGH (ref 65–99)
Osmolality: 285 (ref 275–301)
Potassium: 3.5 mmol/L (ref 3.5–5.1)
Sodium: 142 mmol/L (ref 136–145)

## 2013-06-21 LAB — CBC WITH DIFFERENTIAL/PLATELET
Basophil #: 0 10*3/uL (ref 0.0–0.1)
Basophil %: 0.3 %
HCT: 32.8 % — ABNORMAL LOW (ref 35.0–47.0)
HGB: 10.8 g/dL — ABNORMAL LOW (ref 12.0–16.0)
Lymphocyte #: 1.1 10*3/uL (ref 1.0–3.6)
Lymphocyte %: 8.9 %
MCHC: 33 g/dL (ref 32.0–36.0)
MCV: 81 fL (ref 80–100)
Monocyte #: 1.2 x10 3/mm — ABNORMAL HIGH (ref 0.2–0.9)
Monocyte %: 9.8 %
Neutrophil %: 80.9 %

## 2013-06-22 LAB — CBC WITH DIFFERENTIAL/PLATELET
Basophil #: 0 10*3/uL (ref 0.0–0.1)
HCT: 32.5 % — ABNORMAL LOW (ref 35.0–47.0)
HGB: 10.8 g/dL — ABNORMAL LOW (ref 12.0–16.0)
Lymphocyte #: 0.9 10*3/uL — ABNORMAL LOW (ref 1.0–3.6)
MCH: 26.8 pg (ref 26.0–34.0)
MCHC: 33.2 g/dL (ref 32.0–36.0)
MCV: 81 fL (ref 80–100)
Monocyte #: 0.8 x10 3/mm (ref 0.2–0.9)
Neutrophil #: 9.1 10*3/uL — ABNORMAL HIGH (ref 1.4–6.5)
Neutrophil %: 84 %
Platelet: 280 10*3/uL (ref 150–440)
RBC: 4.03 10*6/uL (ref 3.80–5.20)
RDW: 15.1 % — ABNORMAL HIGH (ref 11.5–14.5)
WBC: 10.8 10*3/uL (ref 3.6–11.0)

## 2013-06-22 LAB — BASIC METABOLIC PANEL
Anion Gap: 8 (ref 7–16)
Calcium, Total: 8.2 mg/dL — ABNORMAL LOW (ref 8.5–10.1)
Chloride: 107 mmol/L (ref 98–107)
Co2: 26 mmol/L (ref 21–32)
EGFR (African American): >60
Osmolality: 281 (ref 275–301)
Potassium: 3.1 mmol/L — ABNORMAL LOW (ref 3.5–5.1)

## 2013-06-22 LAB — SEDIMENTATION RATE: Erythrocyte Sed Rate: 104 mm/hr — ABNORMAL HIGH (ref 0–30)

## 2013-06-22 LAB — MAGNESIUM: Magnesium: 1.6 mg/dL — ABNORMAL LOW

## 2013-06-23 LAB — COMPREHENSIVE METABOLIC PANEL
Alkaline Phosphatase: 142 U/L — ABNORMAL HIGH (ref 50–136)
BUN: 6 mg/dL — ABNORMAL LOW (ref 7–18)
Calcium, Total: 8 mg/dL — ABNORMAL LOW (ref 8.5–10.1)
Co2: 27 mmol/L (ref 21–32)
Creatinine: 0.63 mg/dL (ref 0.60–1.30)
EGFR (Non-African Amer.): >60
Glucose: 109 mg/dL — ABNORMAL HIGH (ref 65–99)
Osmolality: 285 (ref 275–301)
Potassium: 3.2 mmol/L — ABNORMAL LOW (ref 3.5–5.1)
SGOT(AST): 48 U/L — ABNORMAL HIGH (ref 15–37)
Sodium: 144 mmol/L (ref 136–145)
Total Protein: 5.6 g/dL — ABNORMAL LOW (ref 6.4–8.2)

## 2013-06-23 LAB — CBC WITH DIFFERENTIAL/PLATELET
Basophil #: 0 10*3/uL (ref 0.0–0.1)
Eosinophil %: 1.9 %
HGB: 9.3 g/dL — ABNORMAL LOW (ref 12.0–16.0)
Lymphocyte #: 1.3 10*3/uL (ref 1.0–3.6)
Lymphocyte %: 17.9 %
MCHC: 33.1 g/dL (ref 32.0–36.0)
MCV: 81 fL (ref 80–100)
Monocyte #: 0.6 x10 3/mm (ref 0.2–0.9)
Neutrophil #: 5.1 10*3/uL (ref 1.4–6.5)
Neutrophil %: 71.7 %
RBC: 3.47 10*6/uL — ABNORMAL LOW (ref 3.80–5.20)
WBC: 7.1 10*3/uL (ref 3.6–11.0)

## 2013-06-23 LAB — TSH: Thyroid Stimulating Horm: 1.23 u[IU]/mL

## 2013-06-23 LAB — URINE CULTURE

## 2013-06-24 ENCOUNTER — Other Ambulatory Visit: Payer: Self-pay

## 2013-06-24 DIAGNOSIS — E559 Vitamin D deficiency, unspecified: Secondary | ICD-10-CM

## 2013-06-24 DIAGNOSIS — C50919 Malignant neoplasm of unspecified site of unspecified female breast: Secondary | ICD-10-CM

## 2013-06-24 LAB — CBC WITH DIFFERENTIAL/PLATELET
Basophil %: 1.1 %
Eosinophil %: 2.9 %
HCT: 28.9 % — ABNORMAL LOW (ref 35.0–47.0)
HGB: 9.7 g/dL — ABNORMAL LOW (ref 12.0–16.0)
Lymphocyte %: 20.1 %
MCH: 26.9 pg (ref 26.0–34.0)
MCHC: 33.6 g/dL (ref 32.0–36.0)
MCV: 80 fL (ref 80–100)
Monocyte %: 8.2 %
Neutrophil #: 3.8 10*3/uL (ref 1.4–6.5)
Platelet: 322 10*3/uL (ref 150–440)
RDW: 15.2 % — ABNORMAL HIGH (ref 11.5–14.5)
WBC: 5.6 10*3/uL (ref 3.6–11.0)

## 2013-06-24 LAB — BASIC METABOLIC PANEL
Anion Gap: 10 (ref 7–16)
Co2: 24 mmol/L (ref 21–32)
Creatinine: 0.75 mg/dL (ref 0.60–1.30)
Osmolality: 281 (ref 275–301)
Potassium: 2.9 mmol/L — ABNORMAL LOW (ref 3.5–5.1)
Sodium: 142 mmol/L (ref 136–145)

## 2013-06-24 LAB — MAGNESIUM: Magnesium: 1.7 mg/dL — ABNORMAL LOW

## 2013-06-24 MED ORDER — LEVETIRACETAM 500 MG PO TABS: 1000.0000 mg | ORAL_TABLET | Freq: Two times a day (BID) | ORAL | Status: DC

## 2013-06-25 LAB — CULTURE, BLOOD (SINGLE)

## 2013-06-27 LAB — CULTURE, BLOOD (SINGLE)

## 2013-06-28 ENCOUNTER — Ambulatory Visit (INDEPENDENT_AMBULATORY_CARE_PROVIDER_SITE_OTHER): Payer: BC Managed Care – PPO | Admitting: Family Medicine

## 2013-06-28 VITALS — BP 120/70 | Temp 98.5°F | Wt 175.0 lb

## 2013-06-28 DIAGNOSIS — N390 Urinary tract infection, site not specified: Secondary | ICD-10-CM

## 2013-06-28 DIAGNOSIS — R509 Fever, unspecified: Secondary | ICD-10-CM

## 2013-06-28 DIAGNOSIS — E876 Hypokalemia: Secondary | ICD-10-CM

## 2013-06-28 LAB — POCT URINALYSIS DIPSTICK
Bilirubin, UA: NEGATIVE
Spec Grav, UA: 1.025
pH, UA: 5

## 2013-06-28 MED ORDER — SULFAMETHOXAZOLE-TMP DS 800-160 MG PO TABS: 1.0000 | ORAL_TABLET | Freq: Two times a day (BID) | ORAL | Status: DC

## 2013-06-28 NOTE — Patient Instructions (Signed)
Stop the Levaquin and start the new medicine. Let me see you next week unless you get worse then see me sooner

## 2013-06-28 NOTE — Progress Notes (Signed)
  Subjective:    Patient ID: Brandi [name], female    DOB: December 18, 1955, 57 y.o.   MRN: [id number]  HPI She is here for recheck. She was recently seen at [facility] and admitted. She did have a fever and Escherichia coli was noted in her urine but only 40,000 sensitive to Levaquin. She was sent home on Levaquin. She was also noted to have a low potassium. She now complains of fever, diaphoresis, malaise, fatigue no cough, congestion, abdominal pain, urinary symptoms, nausea or   Review of Systems     Objective:   Physical Exam alert and in no distress. Tympanic membranes and canals are normal. Throat is clear. Tonsils are normal. Neck is supple without adenopathy or thyromegaly. Cardiac exam shows a regular sinus rhythm without murmurs or gallops. Lungs are clear to auscultation. Unspun urine microscopic did show white cells and bacteria      Assessment & Plan:  Hypokalemia - Plan: Comprehensive metabolic panel  UTI (lower urinary tract infection) - Plan: Urinalysis Dipstick, sulfamethoxazole-trimethoprim (BACTRIM DS) 800-160 MG per tablet  Fever and chills - Plan: CBC with Differential, Comprehensive metabolic panel, sulfamethoxazole-trimethoprim (BACTRIM DS) 800-160 MG per tablet  will switch her to Septra. There is no good reason why they Levaquin was in going to work. We'll also followup on her blood work.

## 2013-06-29 ENCOUNTER — Encounter (HOSPITAL_COMMUNITY): Payer: Self-pay | Admitting: Emergency Medicine

## 2013-06-29 ENCOUNTER — Inpatient Hospital Stay (HOSPITAL_COMMUNITY)
Admission: EM | Admit: 2013-06-29 | Discharge: 2013-06-30 | DRG: 569 | Disposition: A | Discharge status: Home / Self Care | Payer: BC Managed Care – PPO | Attending: Internal Medicine | Admitting: Internal Medicine

## 2013-06-29 ENCOUNTER — Emergency Department (HOSPITAL_COMMUNITY): Payer: BC Managed Care – PPO

## 2013-06-29 DIAGNOSIS — E875 Hyperkalemia: Secondary | ICD-10-CM | POA: Diagnosis present

## 2013-06-29 DIAGNOSIS — C50919 Malignant neoplasm of unspecified site of unspecified female breast: Secondary | ICD-10-CM | POA: Diagnosis present

## 2013-06-29 DIAGNOSIS — Z880 Allergy status to penicillin: Secondary | ICD-10-CM

## 2013-06-29 DIAGNOSIS — R7402 Elevation of levels of lactic acid dehydrogenase (LDH): Secondary | ICD-10-CM | POA: Diagnosis present

## 2013-06-29 DIAGNOSIS — D473 Essential (hemorrhagic) thrombocythemia: Secondary | ICD-10-CM | POA: Diagnosis present

## 2013-06-29 DIAGNOSIS — N289 Disorder of kidney and ureter, unspecified: Secondary | ICD-10-CM

## 2013-06-29 DIAGNOSIS — K219 Gastro-esophageal reflux disease without esophagitis: Secondary | ICD-10-CM | POA: Diagnosis present

## 2013-06-29 DIAGNOSIS — D72829 Elevated white blood cell count, unspecified: Secondary | ICD-10-CM | POA: Diagnosis present

## 2013-06-29 DIAGNOSIS — N390 Urinary tract infection, site not specified: Secondary | ICD-10-CM | POA: Diagnosis present

## 2013-06-29 DIAGNOSIS — E785 Hyperlipidemia, unspecified: Secondary | ICD-10-CM | POA: Diagnosis present

## 2013-06-29 DIAGNOSIS — F209 Schizophrenia, unspecified: Secondary | ICD-10-CM | POA: Diagnosis present

## 2013-06-29 DIAGNOSIS — E860 Dehydration: Secondary | ICD-10-CM | POA: Diagnosis present

## 2013-06-29 DIAGNOSIS — N179 Acute kidney failure, unspecified: Secondary | ICD-10-CM

## 2013-06-29 DIAGNOSIS — Z79899 Other long term (current) drug therapy: Secondary | ICD-10-CM

## 2013-06-29 DIAGNOSIS — R7401 Elevation of levels of liver transaminase levels: Secondary | ICD-10-CM | POA: Diagnosis present

## 2013-06-29 DIAGNOSIS — Z853 Personal history of malignant neoplasm of breast: Secondary | ICD-10-CM

## 2013-06-29 DIAGNOSIS — R748 Abnormal levels of other serum enzymes: Secondary | ICD-10-CM

## 2013-06-29 LAB — CBC WITH DIFFERENTIAL/PLATELET
Eosinophils Relative: 0 % (ref 0–5)
HCT: 41 % (ref 36.0–46.0)
Hemoglobin: 13.6 g/dL (ref 12.0–15.0)
Hemoglobin: 14.2 g/dL (ref 12.0–15.0)
Lymphocytes Relative: 26 % (ref 12–46)
Lymphs Abs: 1.5 10*3/uL (ref 0.7–4.0)
MCH: 27.5 pg (ref 26.0–34.0)
MCV: 82.8 fL (ref 78.0–100.0)
Monocytes Absolute: 0.8 10*3/uL (ref 0.1–1.0)
Monocytes Absolute: 0.9 10*3/uL (ref 0.1–1.0)
Monocytes Relative: 6 % (ref 3–12)
Monocytes Relative: 4 % (ref 3–12)
Neutro Abs: 9.5 10*3/uL — ABNORMAL HIGH (ref 1.7–7.7)
Neutro Abs: 19.7 10*3/uL — ABNORMAL HIGH (ref 1.7–7.7)
Platelets: 961 10*3/uL (ref 150–400)
RDW: 15.2 % (ref 11.5–15.5)
WBC: 14.3 10*3/uL — ABNORMAL HIGH (ref 4.0–10.5)

## 2013-06-29 LAB — BASIC METABOLIC PANEL
BUN: 20 mg/dL (ref 6–23)
GFR calc Af Amer: 54 mL/min — ABNORMAL LOW (ref >90)
GFR calc non Af Amer: 47 mL/min — ABNORMAL LOW (ref >90)
Potassium: 5.3 mEq/L — ABNORMAL HIGH (ref 3.5–5.1)
Sodium: 139 mEq/L (ref 135–145)

## 2013-06-29 LAB — POCT I-STAT, CHEM 8
HCT: 46 % (ref 36.0–46.0)
Hemoglobin: 15.6 g/dL — ABNORMAL HIGH (ref 12.0–15.0)
Potassium: 5 mEq/L (ref 3.5–5.1)
Sodium: 138 mEq/L (ref 135–145)
TCO2: 26 mmol/L (ref 0–100)

## 2013-06-29 LAB — URINALYSIS, ROUTINE W REFLEX MICROSCOPIC
Glucose, UA: NEGATIVE mg/dL
Ketones, ur: NEGATIVE mg/dL
Protein, ur: NEGATIVE mg/dL
Urobilinogen, UA: 0.2 mg/dL (ref 0.0–1.0)

## 2013-06-29 LAB — COMPREHENSIVE METABOLIC PANEL
AST: 86 U/L — ABNORMAL HIGH (ref 0–37)
Albumin: 4.9 g/dL (ref 3.5–5.2)
BUN: 15 mg/dL (ref 6–23)
CO2: 25 mEq/L (ref 19–32)
Calcium: 11.3 mg/dL — ABNORMAL HIGH (ref 8.4–10.5)
Chloride: 100 mEq/L (ref 96–112)
Glucose, Bld: 118 mg/dL — ABNORMAL HIGH (ref 70–99)
Potassium: 6.2 mEq/L — ABNORMAL HIGH (ref 3.5–5.3)

## 2013-06-29 LAB — HEPATIC FUNCTION PANEL
ALT: 144 U/L — ABNORMAL HIGH (ref 0–35)
Albumin: 4.3 g/dL (ref 3.5–5.2)
Alkaline Phosphatase: 220 U/L — ABNORMAL HIGH (ref 39–117)
Total Bilirubin: 0.4 mg/dL (ref 0.3–1.2)
Total Protein: 8.8 g/dL — ABNORMAL HIGH (ref 6.0–8.3)

## 2013-06-29 LAB — LIPASE, BLOOD: Lipase: 16 U/L (ref 11–59)

## 2013-06-29 LAB — PATHOLOGIST SMEAR REVIEW

## 2013-06-29 MED ORDER — PANTOPRAZOLE SODIUM 40 MG PO TBEC
40.0000 mg | DELAYED_RELEASE_TABLET | Freq: Every day | ORAL | Status: DC
  Administered 2013-06-29 – 2013-06-30 (×2): 40 mg via ORAL
  Filled 2013-06-29 (×2): qty 1

## 2013-06-29 MED ORDER — MAGNESIUM 30 MG PO TABS: 30.0000 mg | ORAL_TABLET | Freq: Two times a day (BID) | ORAL | Status: DC

## 2013-06-29 MED ORDER — HYDROCODONE-ACETAMINOPHEN 5-325 MG PO TABS
1.0000 | ORAL_TABLET | ORAL | Status: DC | PRN
  Administered 2013-06-30: 2 via ORAL
  Filled 2013-06-29: qty 2

## 2013-06-29 MED ORDER — LACOSAMIDE 50 MG PO TABS
100.0000 mg | ORAL_TABLET | Freq: Two times a day (BID) | ORAL | Status: DC
  Administered 2013-06-29 – 2013-06-30 (×2): 100 mg via ORAL
  Filled 2013-06-29 (×2): qty 2

## 2013-06-29 MED ORDER — ONDANSETRON HCL 4 MG/2ML IJ SOLN: 4.0000 mg | Freq: Three times a day (TID) | INTRAMUSCULAR | Status: DC | PRN

## 2013-06-29 MED ORDER — SODIUM CHLORIDE 0.9 % IV SOLN
INTRAVENOUS | Status: DC
  Administered 2013-06-29: via INTRAVENOUS

## 2013-06-29 MED ORDER — LORATADINE 10 MG PO TABS
10.0000 mg | ORAL_TABLET | Freq: Every day | ORAL | Status: DC
  Administered 2013-06-29 – 2013-06-30 (×2): 10 mg via ORAL
  Filled 2013-06-29 (×2): qty 1

## 2013-06-29 MED ORDER — LURASIDONE HCL 40 MG PO TABS
120.0000 mg | ORAL_TABLET | Freq: Every day | ORAL | Status: DC
  Administered 2013-06-29: 120 mg via ORAL
  Filled 2013-06-29 (×2): qty 3

## 2013-06-29 MED ORDER — SUMATRIPTAN SUCCINATE 100 MG PO TABS
100.0000 mg | ORAL_TABLET | Freq: Two times a day (BID) | ORAL | Status: DC | PRN
  Filled 2013-06-29 (×2): qty 1

## 2013-06-29 MED ORDER — TAMSULOSIN HCL 0.4 MG PO CAPS
0.4000 mg | ORAL_CAPSULE | Freq: Every day | ORAL | Status: DC
Start: 2013-06-29 — End: 2013-06-30
  Administered 2013-06-29 – 2013-06-30 (×2): 0.4 mg via ORAL
  Filled 2013-06-29 (×2): qty 1

## 2013-06-29 MED ORDER — SODIUM CHLORIDE 0.9 % IV BOLUS (SEPSIS)
1000.0000 mL | Freq: Once | INTRAVENOUS | Status: AC
  Administered 2013-06-29: 1000 mL via INTRAVENOUS

## 2013-06-29 MED ORDER — DEXTROSE 5 % IV SOLN
1.0000 g | Freq: Once | INTRAVENOUS | Status: AC
  Administered 2013-06-29: 1 g via INTRAVENOUS
  Filled 2013-06-29: qty 10

## 2013-06-29 MED ORDER — IOHEXOL 300 MG/ML  SOLN
50.0000 mL | Freq: Once | INTRAMUSCULAR | Status: AC | PRN
  Administered 2013-06-29: 50 mL via ORAL

## 2013-06-29 MED ORDER — IOHEXOL 300 MG/ML  SOLN
100.0000 mL | Freq: Once | INTRAMUSCULAR | Status: AC | PRN
  Administered 2013-06-29: 100 mL via INTRAVENOUS

## 2013-06-29 MED ORDER — SODIUM CHLORIDE 0.9 % IV SOLN
INTRAVENOUS | Status: DC
  Administered 2013-06-29: 18:00:00 via INTRAVENOUS

## 2013-06-29 MED ORDER — VENLAFAXINE HCL 75 MG PO TABS
150.0000 mg | ORAL_TABLET | Freq: Two times a day (BID) | ORAL | Status: DC
  Administered 2013-06-29 – 2013-06-30 (×2): 150 mg via ORAL
  Filled 2013-06-29 (×3): qty 2

## 2013-06-29 MED ORDER — ANASTROZOLE 1 MG PO TABS
1.0000 mg | ORAL_TABLET | Freq: Every day | ORAL | Status: DC
  Administered 2013-06-29 – 2013-06-30 (×2): 1 mg via ORAL
  Filled 2013-06-29 (×2): qty 1

## 2013-06-29 MED ORDER — ONDANSETRON HCL 4 MG/2ML IJ SOLN: 4.0000 mg | Freq: Four times a day (QID) | INTRAMUSCULAR | Status: DC | PRN

## 2013-06-29 MED ORDER — ONDANSETRON HCL 4 MG PO TABS: 4.0000 mg | ORAL_TABLET | Freq: Four times a day (QID) | ORAL | Status: DC | PRN

## 2013-06-29 MED ORDER — ENOXAPARIN SODIUM 40 MG/0.4ML ~~LOC~~ SOLN
40.0000 mg | SUBCUTANEOUS | Status: DC
  Administered 2013-06-29: 40 mg via SUBCUTANEOUS
  Filled 2013-06-29 (×2): qty 0.4

## 2013-06-29 MED ORDER — LORAZEPAM 0.5 MG PO TABS
0.5000 mg | ORAL_TABLET | Freq: Every day | ORAL | Status: DC
  Administered 2013-06-29: 0.5 mg via ORAL
  Filled 2013-06-29: qty 1

## 2013-06-29 MED ORDER — SODIUM CHLORIDE 0.9 % IV BOLUS (SEPSIS): 1000.0000 mL | Freq: Once | INTRAVENOUS | Status: AC

## 2013-06-29 MED ORDER — HYDROMORPHONE HCL PF 1 MG/ML IJ SOLN: 1.0000 mg | INTRAMUSCULAR | Status: DC | PRN

## 2013-06-29 MED ORDER — LEVETIRACETAM 500 MG PO TABS
1000.0000 mg | ORAL_TABLET | Freq: Two times a day (BID) | ORAL | Status: DC
  Administered 2013-06-29 – 2013-06-30 (×2): 1000 mg via ORAL
  Filled 2013-06-29 (×3): qty 2

## 2013-06-29 MED ORDER — MAGNESIUM GLUCONATE 500 MG PO TABS
500.0000 mg | ORAL_TABLET | Freq: Two times a day (BID) | ORAL | Status: DC
  Administered 2013-06-29: 500 mg via ORAL
  Administered 2013-06-30: 11:00:00 via ORAL
  Filled 2013-06-29 (×3): qty 1

## 2013-06-29 MED ORDER — LEVOFLOXACIN IN D5W 500 MG/100ML IV SOLN
500.0000 mg | Freq: Every day | INTRAVENOUS | Status: DC
  Administered 2013-06-29: 500 mg via INTRAVENOUS
  Filled 2013-06-29 (×2): qty 100

## 2013-06-29 NOTE — ED Notes (Signed)
Pt was sent here from her PCP due to her potassium level being high

## 2013-06-29 NOTE — ED Notes (Signed)
Pt given apple sauce

## 2013-06-29 NOTE — ED Notes (Signed)
Pt in CT. Explained delay to pt's family.

## 2013-06-29 NOTE — ED Provider Notes (Signed)
History    CSN: [id number] Arrival date & time 06/29/13  1037  First MD Initiated Contact with Patient 06/29/13 1042     Chief Complaint  Patient presents with  . potassium level high     sent from PCP   (Consider location/radiation/quality/duration/timing/severity/associated sxs/prior Treatment) HPI Comments: 57 year old female with a history of dyslipidemia, schizophrenia, recent urinary infection requiring admission to an outside hospital for approximately one week, recently discharged on Levaquin. She followed up with her family doctor yesterday and was found to have ongoing signs of urinary tract infection with large leukocytosis and bacteria present on the microscopic, elevated potassium at 6.2, transaminitis and a thrombocytosis with a leukocytosis. The patient had been complaining of fever and fatigue.  The patient was recommended to come to the hospital for further evaluation of lab work, denies fevers, has had nausea and vomiting this morning but is not nauseated at this time and denies any abdominal pain. The symptoms are gradually worsening, nothing makes it better or worse, no associated rashes swelling chest pain coughing or shortness of breath.  The history is provided by the patient and a relative.   Past Medical History  Diagnosis Date  . Dyslipidemia   . Depression     MAJOR  . GERD (gastroesophageal reflux disease)   . Migraines   . Allergic rhinitis   . Schizophrenic disorder   . Tonic-clonic seizure disorder X1  2010--  NO SEIZURE SINCE  . History of breast cancer DX 2010--  S/P LUMPECTOMY AND RADIATION -- NO RECURRENCE    ONCOLOGIST- DR Donnie Coffin  . Anemia   . Left ureteral calculus   . Left flank pain   . Arthritis HX RIGHT ANKLE FX   Past Surgical History  Procedure Laterality Date  . Child birth      C-SECTION  . Breast surgery  01-04-2009  DR [facility]    LEFT BREAST LUMPECTOMY W/ SLN DISSECTION  FOR CANCER  . Cesarean section  1998  . Laparoscopic  cholecystectomy  1990'S  . Cystoscopy with retrograde pyelogram, ureteroscopy and stent placement  11/10/2012    Procedure: CYSTOSCOPY WITH RETROGRADE PYELOGRAM, URETEROSCOPY AND STENT PLACEMENT;  Surgeon: Lindaann Slough, MD;  Location: [facility];  Service: Urology;  Laterality: Left;  WITH DOUBLE J STENT  . Holmium laser application  11/10/2012    Procedure: HOLMIUM LASER APPLICATION;  Surgeon: Lindaann Slough, MD;  Location: [facility];  Service: Urology;  Laterality: Left;   Family History  Problem Relation Age of Onset  . Mental illness Maternal Uncle   . Cancer Maternal Grandmother   . Stroke Maternal Grandmother   . Tuberculosis Maternal Grandfather   . Cancer Paternal Grandmother    History  Substance Use Topics  . Smoking status: Never Smoker   . Smokeless tobacco: Never Used  . Alcohol Use: No   OB History   Grav Para Term Preterm Abortions TAB SAB Ect Mult Living                 Review of Systems  All other systems reviewed and are negative.    Allergies  Penicillins  Home Medications   Current Outpatient Rx  Name  Route  Sig  Dispense  Refill  . anastrozole (ARIMIDEX) 1 MG tablet   Oral   Take 1 tablet (1 mg total) by mouth daily.   30 tablet   8   . atorvastatin (LIPITOR) 20 MG tablet      TAKE 1  TABLET (20 MG TOTAL) BY MOUTH DAILY.   90 tablet   2   . calcium-vitamin D (OSCAL WITH D) 250-125 MG-UNIT per tablet   Oral   Take 1 tablet by mouth daily.          . fish oil-omega-3 fatty acids 1000 MG capsule   Oral   Take 2 g by mouth daily.          . Garlic 1000 MG CAPS   Oral   Take 1,000 mg by mouth daily.          . Ginkgo Biloba 40 MG TABS   Oral   Take 40 mg by mouth daily.          Marland Kitchen lacosamide (VIMPAT) 50 MG TABS   Oral   Take 100 mg by mouth 2 (two) times daily.          Marland Kitchen levETIRAcetam (KEPPRA) 500 MG tablet   Oral   Take 2 tablets (1,000 mg total) by mouth 2 (two) times daily.   120  tablet   0     PLEASE SCHEDULE APPT   . loratadine (CLARITIN) 10 MG tablet   Oral   Take 10 mg by mouth daily.          Marland Kitchen LORazepam (ATIVAN) 0.5 MG tablet   Oral   Take 0.5 mg by mouth every evening.          . Lurasidone HCl 120 MG TABS   Oral   Take 120 mg by mouth at bedtime.         . magnesium 30 MG tablet   Oral   Take 30 mg by mouth 2 (two) times daily.          . Multiple Vitamins-Minerals (MULTIVITAMIN WITH MINERALS) tablet   Oral   Take 1 tablet by mouth daily.          . pantoprazole (PROTONIX) 40 MG tablet      TAKE 1 TABLET BY MOUTH ONCE A DAY   30 tablet   PRN   . psyllium (REGULOID) 0.52 G capsule   Oral   Take 0.52 g by mouth daily. 3 TABS IN AM AND 3 TABS AT BED TIME         . sulfamethoxazole-trimethoprim (BACTRIM DS) 800-160 MG per tablet   Oral   Take 1 tablet by mouth 2 (two) times daily.   20 tablet   0   . SUMAtriptan (IMITREX) 100 MG tablet   Oral   Take 100 mg by mouth every 2 (two) hours as needed.          . Tamsulosin HCl (FLOMAX) 0.4 MG CAPS   Oral   Take 0.4 mg by mouth daily.         Marland Kitchen venlafaxine (EFFEXOR) 75 MG tablet   Oral   Take 150 mg by mouth 2 (two) times daily. ONE IN THE AM AND ONE IN PM         . oxyCODONE-acetaminophen (PERCOCET/ROXICET) 5-325 MG per tablet   Oral   Take 1 tablet by mouth every 4 (four) hours as needed.   30 tablet   0    BP 124/70  Pulse 103  Temp(Src) 98.4 F (36.9 C) (Oral)  Resp 17  SpO2 97% Physical Exam  Nursing note and vitals reviewed. Constitutional: She appears well-developed and well-nourished. No distress.  HENT:  Head: Normocephalic and atraumatic.  Mouth/Throat: Oropharynx is clear and moist. No oropharyngeal exudate.  Mucous membranes are moist, no angioedema  Eyes: Conjunctivae and EOM are normal. Pupils are equal, round, and reactive to light. Right eye exhibits no discharge. Left eye exhibits no discharge. No scleral icterus.  Neck: Normal range of  motion. Neck supple. No JVD present. No thyromegaly present.  Cardiovascular: Regular rhythm, normal heart sounds and intact distal pulses.  Exam reveals no gallop and no friction rub.   No murmur heard. Tachycardia present, regular, pulses are strong at the radial arteries  Pulmonary/Chest: Effort normal and breath sounds normal. No respiratory distress. She has no wheezes. She has no rales.  Abdominal: Soft. Bowel sounds are normal. She exhibits no distension and no mass. There is no tenderness.  No abdominal or CVA tenderness  Musculoskeletal: Normal range of motion. She exhibits no edema and no tenderness.  Lymphadenopathy:    She has no cervical adenopathy.  Neurological: She is alert. Coordination normal.  Skin: Skin is warm and dry. No rash noted. No erythema.  Psychiatric: She has a normal mood and affect. Her behavior is normal.    ED Course  Procedures (including critical care time) Labs Reviewed  BASIC METABOLIC PANEL - Abnormal; Notable for the following:    Potassium 5.3 (*)    Chloride 95 (*)    Glucose, Bld 181 (*)    Creatinine, Ser 1.25 (*)    Calcium 12.1 (*)    GFR calc non Af Amer 47 (*)    GFR calc Af Amer 54 (*)    All other components within normal limits  CBC WITH DIFFERENTIAL - Abnormal; Notable for the following:    WBC 22.1 (*)    RBC 5.17 (*)    Platelets 961 (*)    Neutrophils Relative % 89 (*)    Lymphocytes Relative 7 (*)    Neutro Abs 19.7 (*)    All other components within normal limits  HEPATIC FUNCTION PANEL - Abnormal; Notable for the following:    Total Protein 8.8 (*)    AST 55 (*)    ALT 144 (*)    Alkaline Phosphatase 220 (*)    All other components within normal limits  URINALYSIS, ROUTINE W REFLEX MICROSCOPIC - Abnormal; Notable for the following:    APPearance CLOUDY (*)    Leukocytes, UA SMALL (*)    All other components within normal limits  URINE MICROSCOPIC-ADD ON - Abnormal; Notable for the following:    Squamous Epithelial  / LPF FEW (*)    Bacteria, UA MANY (*)    All other components within normal limits  POCT I-STAT, CHEM 8 - Abnormal; Notable for the following:    Creatinine, Ser 1.40 (*)    Glucose, Bld 187 (*)    Calcium, Ion 1.32 (*)    Hemoglobin 15.6 (*)    All other components within normal limits  URINE CULTURE  URINE CULTURE  PATHOLOGIST SMEAR REVIEW  LIPASE, BLOOD   No results found. 1. UTI (lower urinary tract infection)   2. Renal insufficiency     MDM  The patient states that she has no nausea or pain at this time, she does have a tachycardia which I suspect is related in someway to dehydration and possible recurrent urinary infection. She started treatment with Levaquin when she left the hospital and was switched to Bactrim of which she took her first dose last night. She has a penicillin allergy but it is a minor allergy and it appears that she can take cephalosporins as needed. Urine culture  ordered, labs rechecked.  There is some concern that the patient may have a renal abscess and she has ongoing UTI, ongoing tachycardia, is on multiple antibiotics over the last 2 weeks. I discussed her care with the hospitalist to admit the patient in the hospital.   Meds given in ED:  Medications  ondansetron (ZOFRAN) injection 4 mg (not administered)  cefTRIAXone (ROCEPHIN) 1 g in dextrose 5 % 50 mL IVPB (1 g Intravenous New Bag/Given 06/29/13 1608)  iohexol (OMNIPAQUE) 300 MG/ML solution 50 mL (not administered)  sodium chloride 0.9 % bolus 1,000 mL (0 mLs Intravenous Stopped 06/29/13 1400)  sodium chloride 0.9 % bolus 1,000 mL (0 mLs Intravenous Stopped 06/29/13 1609)  sodium chloride 0.9 % bolus 1,000 mL (0 mLs Intravenous Duplicate 06/29/13 1400)      Vida Roller, MD 06/29/13 [phone]

## 2013-06-29 NOTE — Progress Notes (Signed)
Dr Izola Price sent a text page and informed of pt on the unit.

## 2013-06-29 NOTE — Progress Notes (Signed)
Text page sent to Dr Sunnie Nielsen and informed her of pt being here.

## 2013-06-29 NOTE — ED Notes (Addendum)
Critical lab value. PLT 961.  Made EDP Hyacinth Meeker aware.

## 2013-06-29 NOTE — ED Notes (Signed)
Pt. Unable to urinate at this time. Pt. Aware of giving a urine specimen. Nurse was notified. 

## 2013-06-29 NOTE — H&P (Signed)
[facility] History and Physical  Brandi [name]:[id number] DOB: 1956-05-02 DOA: 06/29/2013  Referring physician: ED physician PCP: Carollee Herter, MD   Chief Complaint: sent from PCP office due to blood work abnormalities   HPI:  Pt is 57 year old female with a history of dyslipidemia, schizophrenia, recent urinary infection requiring admission to an outside hospital for approximately one week (per record review UA with E. Coli and sensitivity to Levaquin). She followed up with her family doctor one day prior to this admission, due to ongoing symptoms of lower quadrat abdominal pain, dull and intermittent,  7/10 in severity with no specific radiating symptoms, no specific alleviating factors, aggravated with oral intake, associated with malaise and subjective fevers, chills). In addition, in her PCP's office blood work done showed K of 6.2, transaminitis, thrombocytosis and leukocytosis. UA suggestive of another UTI. Pt was advised to come to the hospital and [facility] was asked to admit for further evaluation, repeat blood work. Pt denied chest pain or shortness of breath, no other systemic symptoms.  Assessment and Plan:  Principal Problem:   UTI (urinary tract infection) - based on symptoms, abdominal discomfort, subjective fevers, chills, possible source UTI - will need to follow up on urine cultures - CT abdomen and pelvis with non obstructing calculus, no signs of perinephric abscess - will continue empiric ABX Levaquin for now, please note that pt was given one dose of Rocephin in ED   Active Problems:   Leukocytosis, unspecified - possibly related to principal problem - ABX as noted above - follow up on urine culture    Thrombocytosis - unclear etiology, pathologist smear review requested - please follow upon results, may need hematology consultation    Abnormal transaminases - relatively unclear etiology as well but appears that LFT's are trending down  - CT  abdomen with liver steatosis  - CMET in AM   Hyperkalemia - now resolved, monitor closely and obtain BMP in AM   Acute renal failure - possibly pre renal in etiology, will monitor closely - IVF for now and repeat BMP in AM   GERD (gastroesophageal reflux disease) - stable, continue PPI   History of breast cancer - follows with Dr. Welton Flakes, will notify primary oncologist of admission   Code Status: Full Family Communication: No family at bedside  Disposition Plan: Admit to telemetry unit   Review of Systems:  Constitutional: Positive for fever, chills and malaise/fatigue. Negative for diaphoresis.  HENT: Negative for hearing loss, ear pain, nosebleeds, congestion, sore throat, neck pain, tinnitus and ear discharge.   Eyes: Negative for blurred vision, double vision, photophobia, pain, discharge and redness.  Respiratory: Negative for cough, hemoptysis, sputum production, shortness of breath, wheezing and stridor.   Cardiovascular: Negative for chest pain, palpitations, orthopnea, claudication and leg swelling.  Gastrointestinal: Positive for nausea, negative for vomiting and abdominal pain. Negative for heartburn, constipation, blood in stool and melena.  Genitourinary: Negative for dysuria, urgency, frequency, hematuria.  Musculoskeletal: Negative for myalgias, joint pain and falls.  Skin: Negative for itching and rash.  Neurological: Negative for tingling, tremors, sensory change, speech change, focal weakness, loss of consciousness and headaches.  Endo/Heme/Allergies: Negative for environmental allergies and polydipsia. Does not bruise/bleed easily.  Psychiatric/Behavioral: Negative for suicidal ideas. The patient is not nervous/anxious.      Past Medical History  Diagnosis Date  . Dyslipidemia   . Depression     MAJOR  . GERD (gastroesophageal reflux disease)   . Migraines   . Allergic rhinitis   .  Schizophrenic disorder   . Tonic-clonic seizure disorder X1  2010--  NO SEIZURE  SINCE  . History of breast cancer DX 2010--  S/P LUMPECTOMY AND RADIATION -- NO RECURRENCE    ONCOLOGIST- DR Donnie Coffin  . Anemia   . Left ureteral calculus   . Left flank pain   . Arthritis HX RIGHT ANKLE FX    Past Surgical History  Procedure Laterality Date  . Child birth      C-SECTION  . Breast surgery  01-04-2009  [facility]    LEFT BREAST LUMPECTOMY W/ SLN DISSECTION  FOR CANCER  . Cesarean section  1998  . Laparoscopic cholecystectomy  1990'S  . Cystoscopy with retrograde pyelogram, ureteroscopy and stent placement  11/10/2012    Procedure: CYSTOSCOPY WITH RETROGRADE PYELOGRAM, URETEROSCOPY AND STENT PLACEMENT;  Surgeon: Lindaann Slough, MD;  Location: [facility] [place];  Service: Urology;  Laterality: Left;  WITH DOUBLE J STENT  . Holmium laser application  11/10/2012    Procedure: HOLMIUM LASER APPLICATION;  Surgeon: Lindaann Slough, MD;  Location: [facility];  Service: Urology;  Laterality: Left;    Social History:  reports that she has never smoked. She has never used smokeless tobacco. She reports that she does not drink alcohol or use illicit drugs.  Allergies  Allergen Reactions  . Penicillins Rash    Family History  Problem Relation Age of Onset  . Mental illness Maternal Uncle   . Cancer Maternal Grandmother   . Stroke Maternal Grandmother   . Tuberculosis Maternal Grandfather   . Cancer Paternal Grandmother     Prior to Admission medications   Medication Sig Start Date End Date Taking? Authorizing Provider  anastrozole (ARIMIDEX) 1 MG tablet Take 1 tablet (1 mg total) by mouth daily. 04/22/13  Yes Victorino December, MD  atorvastatin (LIPITOR) 20 MG tablet TAKE 1 TABLET (20 MG TOTAL) BY MOUTH DAILY. 11/30/12  Yes Ronnald Nian, MD  calcium-vitamin D (OSCAL WITH D) 250-125 MG-UNIT per tablet Take 1 tablet by mouth daily.    Yes Historical Provider, MD  fish oil-omega-3 fatty acids 1000 MG capsule Take 2 g by mouth daily.    Yes Historical  Provider, MD  Garlic 1000 MG CAPS Take 1,000 mg by mouth daily.    Yes Historical Provider, MD  Ginkgo Biloba 40 MG TABS Take 40 mg by mouth daily.    Yes Historical Provider, MD  lacosamide (VIMPAT) 50 MG TABS Take 100 mg by mouth 2 (two) times daily.    Yes Historical Provider, MD  levETIRAcetam (KEPPRA) 500 MG tablet Take 2 tablets (1,000 mg total) by mouth 2 (two) times daily. 06/24/13  Yes York Spaniel, MD  loratadine (CLARITIN) 10 MG tablet Take 10 mg by mouth daily.    Yes Historical Provider, MD  LORazepam (ATIVAN) 0.5 MG tablet Take 0.5 mg by mouth every evening.    Yes Historical Provider, MD  Lurasidone HCl 120 MG TABS Take 120 mg by mouth at bedtime.   Yes Historical Provider, MD  magnesium 30 MG tablet Take 30 mg by mouth 2 (two) times daily.    Yes Historical Provider, MD  Multiple Vitamins-Minerals (MULTIVITAMIN WITH MINERALS) tablet Take 1 tablet by mouth daily.    Yes Historical Provider, MD  pantoprazole (PROTONIX) 40 MG tablet TAKE 1 TABLET BY MOUTH ONCE A DAY 01/01/13  Yes Ronnald Nian, MD  psyllium (REGULOID) 0.52 G capsule Take 0.52 g by mouth daily. 3 TABS IN AM AND  3 TABS AT BED TIME   Yes Historical Provider, MD  sulfamethoxazole-trimethoprim (BACTRIM DS) 800-160 MG per tablet Take 1 tablet by mouth 2 (two) times daily. 06/28/13  Yes Ronnald Nian, MD  SUMAtriptan (IMITREX) 100 MG tablet Take 100 mg by mouth every 2 (two) hours as needed.    Yes Historical Provider, MD  Tamsulosin HCl (FLOMAX) 0.4 MG CAPS Take 0.4 mg by mouth daily.   Yes Historical Provider, MD  venlafaxine (EFFEXOR) 75 MG tablet Take 150 mg by mouth 2 (two) times daily. ONE IN THE AM AND ONE IN PM   Yes Historical Provider, MD  oxyCODONE-acetaminophen (PERCOCET/ROXICET) 5-325 MG per tablet Take 1 tablet by mouth every 4 (four) hours as needed. 10/16/12   Ronnald Nian, MD    Physical Exam: Filed Vitals:   06/29/13 1330 06/29/13 1610 06/29/13 1615 06/29/13 1725  BP:  124/70 143/78 137/88  Pulse:  103 103 104 103  Temp:    99.3 F (37.4 C)  TempSrc:    Oral  Resp:  17 15 20   SpO2: 94% 97% 95% 98%    Physical Exam  Constitutional: Appears well-developed and well-nourished. No distress.  HENT: Normocephalic. External right and left ear normal. Oropharynx is clear and moist.  Eyes: Conjunctivae and EOM are normal. PERRLA, no scleral icterus.  Neck: Normal ROM. Neck supple. No JVD. No tracheal deviation. No thyromegaly.  CVS: RRR, S1/S2 +, no murmurs, no gallops, no carotid bruit.  Pulmonary: Effort and breath sounds normal, no stridor, rhonchi, wheezes, rales.  Abdominal: Soft. BS +,  no distension, tenderness, rebound or guarding.  Musculoskeletal: Normal range of motion. No edema and no tenderness.  Lymphadenopathy: No lymphadenopathy noted, cervical, inguinal. Neuro: Alert. Normal reflexes, muscle tone coordination. No cranial nerve deficit. Skin: Skin is warm and dry. No rash noted. Not diaphoretic. No erythema. No pallor.  Psychiatric: Normal mood and affect. Behavior, judgment, thought content normal.   Labs on Admission:  Basic Metabolic Panel:  Recent Labs Lab 06/28/13 1612 06/29/13 1151 06/29/13 1204  NA 139 139 138  K 6.2* 5.3* 5.0  CL 100 95* 102  CO2 25 26  --   GLUCOSE 118* 181* 187*  BUN 15 20 23   CREATININE 0.95 1.25* 1.40*  CALCIUM 11.3* 12.1*  --    Liver Function Tests:  Recent Labs Lab 06/28/13 1612 06/29/13 1151  AST 86* 55*  ALT 173* 144*  ALKPHOS 210* 220*  BILITOT 0.5 0.4  PROT 7.8 8.8*  ALBUMIN 4.9 4.3    Recent Labs Lab 06/29/13 1413  LIPASE 16   CBC:  Recent Labs Lab 06/28/13 1612 06/29/13 1151 06/29/13 1204  WBC 14.3* 22.1*  --   NEUTROABS 9.5* 19.7*  --   HGB 13.6 14.2 15.6*  HCT 41.0 42.8 46.0  MCV 81.2 82.8  --   PLT 963* 961*  --     Radiological Exams on Admission: Ct Abdomen Pelvis W Contrast  06/29/2013   *RADIOLOGY REPORT*  Clinical Data: Abdominal pain and recurrent pyelonephritis.  CT ABDOMEN AND  PELVIS WITH CONTRAST  Technique:  Multidetector CT imaging of the abdomen and pelvis was performed following the standard protocol during bolus administration of intravenous contrast.  Contrast: 50mL OMNIPAQUE IOHEXOL 300 MG/ML  SOLN, OMNIPAQUE IOHEXOL 300 MG/ML  SOLN  Comparison: Prior renal ultrasound performed at [facility] on 10/29/2012.  Findings: There is no evidence of hydronephrosis, renal abscess or renal perfusion abnormality.  A small nonobstructing calculus in the lower pole  of the right kidney measures 4 mm.  The liver shows mild and diffuse steatosis.  The gallbladder has been removed.  There is no evidence of biliary ductal dilatation. The spleen, pancreas and adrenal glands are within normal limits.  Bowel loops are unremarkable and show no evidence of obstruction or inflammation.  No masses or enlarged lymph nodes are seen.  No evidence of abnormal fluid collections.  No hernias are identified. The bladder is unremarkable.  The uterus and adnexal regions are unremarkable by CT.  Mild disc disease present at the L5-S1 level of the spine.  IMPRESSION:  1.  Nonobstructing right lower pole renal calculus.  No evidence of renal/perirenal abscess or renal perfusion abnormality. 2.  Mild steatosis of the liver.   Original Report Authenticated By: Irish Lack, M.D.    EKG: Normal sinus rhythm, no ST/T wave changes  Debbora Presto, MD  [facility] Pager [phone]  If 7PM-7AM, please contact night-coverage [email or web address] Password [facility] 06/29/2013, 5:55 PM

## 2013-06-29 NOTE — Progress Notes (Signed)
Quick Note:  PT IS GOING TO [place] HUSBAND WAS ADVISED ______

## 2013-06-30 DIAGNOSIS — D72829 Elevated white blood cell count, unspecified: Secondary | ICD-10-CM

## 2013-06-30 DIAGNOSIS — E875 Hyperkalemia: Secondary | ICD-10-CM

## 2013-06-30 DIAGNOSIS — K219 Gastro-esophageal reflux disease without esophagitis: Secondary | ICD-10-CM

## 2013-06-30 DIAGNOSIS — Z853 Personal history of malignant neoplasm of breast: Secondary | ICD-10-CM

## 2013-06-30 LAB — COMPREHENSIVE METABOLIC PANEL
AST: 54 U/L — ABNORMAL HIGH (ref 0–37)
Albumin: 2.9 g/dL — ABNORMAL LOW (ref 3.5–5.2)
Alkaline Phosphatase: 139 U/L — ABNORMAL HIGH (ref 39–117)
Chloride: 104 mEq/L (ref 96–112)
Creatinine, Ser: 0.88 mg/dL (ref 0.50–1.10)
Potassium: 3.9 mEq/L (ref 3.5–5.1)
Sodium: 140 mEq/L (ref 135–145)
Total Bilirubin: 0.4 mg/dL (ref 0.3–1.2)

## 2013-06-30 LAB — CBC
Hemoglobin: 9.9 g/dL — ABNORMAL LOW (ref 12.0–15.0)
RBC: 3.64 MIL/uL — ABNORMAL LOW (ref 3.87–5.11)
WBC: 9.4 10*3/uL (ref 4.0–10.5)

## 2013-06-30 LAB — HEMOGLOBIN A1C
Hgb A1c MFr Bld: 6.6 % — ABNORMAL HIGH (ref <5.7)
Mean Plasma Glucose: 143 mg/dL — ABNORMAL HIGH (ref <117)

## 2013-06-30 MED ORDER — LEVOFLOXACIN 500 MG PO TABS: 500.0000 mg | ORAL_TABLET | Freq: Every day | ORAL | Status: DC

## 2013-06-30 NOTE — Progress Notes (Signed)
Patient discharged home with family members, alert and oriented, discharge instruction given, patient verbalize understanding of discharge orders given, My Chart access declined at this time will access at home, patient in stable condition at this time

## 2013-06-30 NOTE — Evaluation (Signed)
Physical Therapy Evaluation Patient Details Name: Brandi [name] MRN: [id number] DOB: 18-Feb-1956 Today's Date: 06/30/2013 Time: 1145-1200 PT Time Calculation (min): 15 min  PT Assessment / Plan / Recommendation History of Present Illness  Pt is 57 year old female with a history of dyslipidemia, schizophrenia, recent urinary infection requiring admission to an outside hospital for approximately one week (per record review UA with E. Coli and sensitivity to Levaquin). She followed up with her family doctor one day prior to this admission, due to ongoing symptoms of lower quadrat abdominal pain, dull and intermittent,  7/10 in severity with no specific radiating symptoms, no specific alleviating factors, aggravated with oral intake, associated with malaise and subjective fevers, chills). In addition, in her PCP's office blood work done showed K of 6.2, transaminitis, thrombocytosis and leukocytosis. UA suggestive of another UTI. Pt was advised to come to the hospital and [facility] was asked to admit for further evaluation, repeat blood work. Pt denied chest pain or shortness of breath, no other systemic symptoms  Clinical Impression  Pt ambulated well, has 24 hour caregivers.     PT Assessment  Patent does not need any further PT services    Follow Up Recommendations  No PT follow up    Does the patient have the potential to tolerate intense rehabilitation      Barriers to Discharge        Equipment Recommendations  None recommended by PT    Recommendations for Other Services     Frequency      Precautions / Restrictions Precautions Precautions: None Restrictions Weight Bearing Restrictions: No   Pertinent Vitals/Pain       Mobility  Bed Mobility Bed Mobility: Supine to Sit Supine to Sit: 7: Independent Transfers Transfers: Sit to Stand;Stand to Sit Sit to Stand: 5: Supervision;From bed;From toilet Stand to Sit: To chair/3-in-1;To toilet;6: Modified independent  (Device/Increase time) Ambulation/Gait Ambulation/Gait Assistance: 5: Supervision Ambulation Distance (Feet): 200 Feet Assistive device: None Gait Pattern: Step-through pattern    Exercises     PT Diagnosis:    PT Problem List:   PT Treatment Interventions:       PT Goals(Current goals can be found in the care plan section)    Visit Information  Last PT Received On: 06/30/13 Assistance Needed: +1 History of Present Illness: Pt is 57 year old female with a history of dyslipidemia, schizophrenia, recent urinary infection requiring admission to an outside hospital for approximately one week (per record review UA with E. Coli and sensitivity to Levaquin). She followed up with her family doctor one day prior to this admission, due to ongoing symptoms of lower quadrat abdominal pain, dull and intermittent,  7/10 in severity with no specific radiating symptoms, no specific alleviating factors, aggravated with oral intake, associated with malaise and subjective fevers, chills). In addition, in her PCP's office blood work done showed K of 6.2, transaminitis, thrombocytosis and leukocytosis. UA suggestive of another UTI. Pt was advised to come to the hospital and [facility] was asked to admit for further evaluation, repeat blood work. Pt denied chest pain or shortness of breath, no other systemic symptoms       Prior Functioning  Home Living Family/patient expects to be discharged to:: Private residence Living Arrangements: Alone;Parent;Children Available Help at Discharge: Family;Available 24 hours/day Type of Home: House Home Access: Level entry Home Layout: One level Home Equipment: None Prior Function Level of Independence: Independent Communication Communication: No difficulties    Cognition  Cognition Arousal/Alertness: Awake/alert Behavior During Therapy: WFL for tasks  assessed/performed    Extremity/Trunk Assessment Upper Extremity Assessment Upper Extremity Assessment: Overall WFL  for tasks assessed Lower Extremity Assessment Lower Extremity Assessment: Overall WFL for tasks assessed   Balance    End of Session PT - End of Session Equipment Utilized During Treatment: Gait belt Activity Tolerance: Patient tolerated treatment well Patient left: in chair;with call bell/phone within reach;with family/visitor present  GP     Rada Hay 06/30/2013, 12:41 PM

## 2013-06-30 NOTE — Discharge Summary (Signed)
Physician Discharge Summary  Brandi [name] ZOX:[id number] DOB: [birth year] DOA: 06/29/2013  PCP: Carollee Herter, MD  Admit date: 06/29/2013 Discharge date: 06/30/2013  Time spent: >30 minutes  Recommendations for Outpatient Follow-up:  1-CMET to follow electrolytes, liver function and renal function 2-Follow urine culture results, in case antibiotic adjustments are needed.  Discharge Diagnoses:  Principal Problem:   UTI (urinary tract infection) Active Problems:   GERD (gastroesophageal reflux disease)   History of breast cancer   Leukocytosis, unspecified   Thrombocytosis   Abnormal transaminases   Hyperkalemia   Acute renal failure   Discharge Condition: stable and improved. Will discharge home. Patient advised to keep herself well hydrated and to take medications as prescribed.   Diet recommendation: low fat diet  Filed Weights   06/29/13 1949  Weight: 87.1 kg (192 lb 0.3 oz)    History of present illness:  57 year old female with a history of dyslipidemia, schizophrenia, recent urinary infection requiring admission to an outside hospital for approximately one week (per record review UA with E. Coli and sensitivity to Levaquin). She followed up with her family doctor one day prior to this admission, due to ongoing symptoms of lower quadrat abdominal pain, dull and intermittent, 7/10 in severity with no specific radiating symptoms, no specific alleviating factors, aggravated with oral intake, associated with malaise and subjective fevers, chills). In addition, in her PCP's office blood work done showed K of 6.2, transaminitis, thrombocytosis and leukocytosis. UA suggestive of another UTI. Pt was advised to come to the hospital and [facility] was asked to admit for further evaluation, repeat blood work. Pt denied chest pain or shortness of breath, no other systemic symptoms.  Hospital Course:  1-UTI (urinary tract infection)  - based on symptoms, abdominal discomfort,  subjective fevers/chills and abnormal UA  - will need to follow up on urine cultures; but good response to IV levaquin. Patient with recent urine culture outside hospital and results demonstrated E.coli sensitive to levaquin - CT abdomen and pelvis with non obstructing calculus, no signs of perinephric abscess  - will continue treatment with levaquin to complete 7 days of treatment for anon complicated UTI. -patient advised to keep herself well hydrated  2-Leukocytosis, unspecified  - possibly related to UTI and stress demargination - ABX as noted above  - WBC at discharge WNL  3-Thrombocytosis  - unclear etiology, but most likely secondary to dehydration. - patient will might need follow up with her oncologist if platelets levels remains elevated during follow up  -at discharge after IVF's and tx for acute infection demonstrated platelets levels of 512  4-Abnormal transaminases  - due to demargination and dehydration - CT abdomen with liver steatosis, but not other acute abnormalities  - LFT's trending down -Will need CMET during follow up visit -patient advise to be compliant with medications and to follow low fat diet  5-Hyperkalemia  - now resolved, monitor closely and obtain BMP during follow up -due to worsening kidney function given UTI and dehydration.  6-Acute renal failure  - presumed to be secondary to UTI and pre renal in etiology, - Resolved with IVF  -will need BMET recheck during next visit. -patient was supposed to be on bactrim; medication discontinue and her UTI treated with levaquin instead.   7-GERD (gastroesophageal reflux disease)  - stable, continue PPI   History of breast cancer  - follows with Dr. Welton Flakes, will continue arimidex and will continue outpatient follow up.  Procedures: See below for x-ray reports.  Consultations:  none  Discharge Exam: Filed Vitals:   06/29/13 1725 06/29/13 1949 06/29/13 2059 06/30/13 0507  BP: 137/88  110/69 107/67   Pulse: 103  105 100  Temp: 99.3 F (37.4 C)  99.2 F (37.3 C) 98.2 F (36.8 C)  TempSrc: Oral  Oral Oral  Resp: 20  18 16   Height:  5' (1.524 m)    Weight:  87.1 kg (192 lb 0.3 oz)    SpO2: 98%  94% 100%   Gen: AAOx3, no acute complaints, tolerating diet, afebrile Heart: RRR, S1/S2 +, no murmurs, no gallops, no JVD.  Pulmonary: CTA bilaterally  Abdominal: Soft. BS +, no distension, tenderness, rebound or guarding.  Musculoskeletal: Normal range of motion. No edema and no tenderness.  Neuro: Alert. Normal reflexes, muscle tone and coordination. Intact CN; no focal deficit.  Discharge Instructions  Discharge Orders   Future Appointments Provider Department Dept Phone   09/16/2013 8:45 AM Ronnald Nian, MD [facility] FAMILY MEDICINE [phone]   Patient should bring all necessary paperwork to be completed.  Arrive 15 minutes prior to the appointment.   01/10/2014 2:00 PM [facility] MEDICAL ONCOLOGY [phone]   01/10/2014 2:30 PM Victorino December, MD [facility] MEDICAL ONCOLOGY [phone]   Future Orders Complete By Expires     Discharge instructions  As directed     Comments:      -Take medications as prescribed -Arrange follow up with PCP in 1 week -Keep yourself well hydrated -Follow a low fat diet        Medication List    STOP taking these medications       sulfamethoxazole-trimethoprim 800-160 MG per tablet  Commonly known as:  BACTRIM DS      TAKE these medications       anastrozole 1 MG tablet  Commonly known as:  ARIMIDEX  Take 1 tablet (1 mg total) by mouth daily.     atorvastatin 20 MG tablet  Commonly known as:  LIPITOR  TAKE 1 TABLET (20 MG TOTAL) BY MOUTH DAILY.     calcium-vitamin D 250-125 MG-UNIT per tablet  Commonly known as:  OSCAL WITH D  Take 1 tablet by mouth daily.     fish oil-omega-3 fatty acids 1000 MG capsule  Take 2 g by mouth daily.     Garlic 1000 MG Caps  Take 1,000 mg by mouth  daily.     Ginkgo Biloba 40 MG Tabs  Take 40 mg by mouth daily.     lacosamide 50 MG Tabs  Commonly known as:  VIMPAT  Take 100 mg by mouth 2 (two) times daily.     levETIRAcetam 500 MG tablet  Commonly known as:  KEPPRA  Take 2 tablets (1,000 mg total) by mouth 2 (two) times daily.     levofloxacin 500 MG tablet  Commonly known as:  LEVAQUIN  Take 1 tablet (500 mg total) by mouth daily.     loratadine 10 MG tablet  Commonly known as:  CLARITIN  Take 10 mg by mouth daily.     LORazepam 0.5 MG tablet  Commonly known as:  ATIVAN  Take 0.5 mg by mouth every evening.     Lurasidone HCl 120 MG Tabs  Take 120 mg by mouth at bedtime.     magnesium 30 MG tablet  Take 30 mg by mouth 2 (two) times daily.     multivitamin with minerals tablet  Take 1 tablet by mouth daily.  oxyCODONE-acetaminophen 5-325 MG per tablet  Commonly known as:  PERCOCET/ROXICET  Take 1 tablet by mouth every 4 (four) hours as needed.     pantoprazole 40 MG tablet  Commonly known as:  PROTONIX  TAKE 1 TABLET BY MOUTH ONCE A DAY     psyllium 0.52 G capsule  Commonly known as:  REGULOID  Take 0.52 g by mouth daily. 3 TABS IN AM AND 3 TABS AT BED TIME     SUMAtriptan 100 MG tablet  Commonly known as:  IMITREX  Take 100 mg by mouth every 2 (two) hours as needed.     tamsulosin 0.4 MG Caps  Commonly known as:  FLOMAX  Take 0.4 mg by mouth daily.     venlafaxine 75 MG tablet  Commonly known as:  EFFEXOR  Take 150 mg by mouth 2 (two) times daily. ONE IN THE AM AND ONE IN PM       Allergies  Allergen Reactions  . Penicillins Rash       Follow-up Information   Follow up with Carollee Herter, MD. Schedule an appointment as soon as possible for a visit in 1 week.   Contact information:   [address] [place] [address] [phone]       The results of significant diagnostics from this hospitalization (including imaging, microbiology, ancillary and laboratory) are  listed below for reference.    Significant Diagnostic Studies: Ct Abdomen Pelvis W Contrast  06/29/2013   *RADIOLOGY REPORT*  Clinical Data: Abdominal pain and recurrent pyelonephritis.  CT ABDOMEN AND PELVIS WITH CONTRAST  Technique:  Multidetector CT imaging of the abdomen and pelvis was performed following the standard protocol during bolus administration of intravenous contrast.  Contrast: 50mL OMNIPAQUE IOHEXOL 300 MG/ML  SOLN, OMNIPAQUE IOHEXOL 300 MG/ML  SOLN  Comparison: Prior renal ultrasound performed at [facility] on 10/29/2012.  Findings: There is no evidence of hydronephrosis, renal abscess or renal perfusion abnormality.  A small nonobstructing calculus in the lower pole of the right kidney measures 4 mm.  The liver shows mild and diffuse steatosis.  The gallbladder has been removed.  There is no evidence of biliary ductal dilatation. The spleen, pancreas and adrenal glands are within normal limits.  Bowel loops are unremarkable and show no evidence of obstruction or inflammation.  No masses or enlarged lymph nodes are seen.  No evidence of abnormal fluid collections.  No hernias are identified. The bladder is unremarkable.  The uterus and adnexal regions are unremarkable by CT.  Mild disc disease present at the L5-S1 level of the spine.  IMPRESSION:  1.  Nonobstructing right lower pole renal calculus.  No evidence of renal/perirenal abscess or renal perfusion abnormality. 2.  Mild steatosis of the liver.   Original Report Authenticated By: Irish Lack, M.D.   Labs: Basic Metabolic Panel:  Recent Labs Lab 06/28/13 1612 06/29/13 1151 06/29/13 1204 06/30/13 0500  NA 139 139 138 140  K 6.2* 5.3* 5.0 3.9  CL 100 95* 102 104  CO2 25 26  --  25  GLUCOSE 118* 181* 187* 122*  BUN 15 20 23 10   CREATININE 0.95 1.25* 1.40* 0.88  CALCIUM 11.3* 12.1*  --  8.8   Liver Function Tests:  Recent Labs Lab 06/28/13 1612 06/29/13 1151 06/30/13 0500  AST 86* 55* 54*  ALT 173*  144* 89*  ALKPHOS 210* 220* 139*  BILITOT 0.5 0.4 0.4  PROT 7.8 8.8* 5.9*  ALBUMIN 4.9 4.3 2.9*    Recent  Labs Lab 06/29/13 1413  LIPASE 16   CBC:  Recent Labs Lab 06/28/13 1612 06/29/13 1151 06/29/13 1204 06/30/13 0534  WBC 14.3* 22.1*  --  9.4  NEUTROABS 9.5* 19.7*  --   --   HGB 13.6 14.2 15.6* 9.9*  HCT 41.0 42.8 46.0 30.6*  MCV 81.2 82.8  --  84.1  PLT 963* 961*  --  519*    Signed:  Youcef Klas  [facility] 06/30/2013, 12:05 PM

## 2013-07-02 ENCOUNTER — Other Ambulatory Visit: Payer: Self-pay

## 2013-07-02 LAB — URINE CULTURE: Colony Count: 80000

## 2013-07-02 MED ORDER — DOXYCYCLINE HYCLATE 100 MG PO TABS: 100.0000 mg | ORAL_TABLET | Freq: Two times a day (BID) | ORAL | Status: DC

## 2013-07-02 NOTE — Progress Notes (Signed)
Quick Note:  PT INFORMED AND MED SENT IN ______

## 2013-07-05 ENCOUNTER — Inpatient Hospital Stay: Payer: BC Managed Care – PPO | Admitting: Family Medicine

## 2013-07-08 ENCOUNTER — Other Ambulatory Visit: Payer: Self-pay

## 2013-07-08 MED ORDER — LACOSAMIDE 100 MG PO TABS: 100.0000 mg | ORAL_TABLET | Freq: Two times a day (BID) | ORAL | Status: DC

## 2013-07-15 ENCOUNTER — Telehealth: Payer: Self-pay | Admitting: Neurology

## 2013-07-15 NOTE — Telephone Encounter (Signed)
Rx with refills was already faxed on 07/24.  I called patient, got no answer.  Left message.

## 2013-07-26 ENCOUNTER — Ambulatory Visit (INDEPENDENT_AMBULATORY_CARE_PROVIDER_SITE_OTHER): Payer: BC Managed Care – PPO | Admitting: Family Medicine

## 2013-07-26 ENCOUNTER — Encounter: Payer: Self-pay | Admitting: Family Medicine

## 2013-07-26 VITALS — BP 130/70 | HR 87 | Wt 184.0 lb

## 2013-07-26 DIAGNOSIS — E875 Hyperkalemia: Secondary | ICD-10-CM

## 2013-07-26 DIAGNOSIS — R748 Abnormal levels of other serum enzymes: Secondary | ICD-10-CM

## 2013-07-26 DIAGNOSIS — R509 Fever, unspecified: Secondary | ICD-10-CM

## 2013-07-26 DIAGNOSIS — N179 Acute kidney failure, unspecified: Secondary | ICD-10-CM

## 2013-07-26 DIAGNOSIS — N390 Urinary tract infection, site not specified: Secondary | ICD-10-CM

## 2013-07-26 LAB — CBC WITH DIFFERENTIAL/PLATELET
Lymphocytes Relative: 25 % (ref 12–46)
Lymphs Abs: 2.5 10*3/uL (ref 0.7–4.0)
Neutro Abs: 6.7 10*3/uL (ref 1.7–7.7)
Neutrophils Relative %: 66 % (ref 43–77)
Platelets: 509 10*3/uL — ABNORMAL HIGH (ref 150–400)
RBC: 4.71 MIL/uL (ref 3.87–5.11)
WBC: 10 10*3/uL (ref 4.0–10.5)

## 2013-07-26 LAB — COMPREHENSIVE METABOLIC PANEL
Albumin: 4.7 g/dL (ref 3.5–5.2)
CO2: 29 mEq/L (ref 19–32)
Calcium: 10.2 mg/dL (ref 8.4–10.5)
Glucose, Bld: 89 mg/dL (ref 70–99)
Sodium: 141 mEq/L (ref 135–145)
Total Bilirubin: 0.4 mg/dL (ref 0.3–1.2)
Total Protein: 6.9 g/dL (ref 6.0–8.3)

## 2013-07-26 LAB — POCT URINALYSIS DIPSTICK
Ketones, UA: NEGATIVE
Protein, UA: NEGATIVE
Spec Grav, UA: 1.015

## 2013-07-26 NOTE — Progress Notes (Signed)
  Subjective:    Patient ID: Brandi [name], female    DOB: 1956/12/10, 57 y.o.   MRN: [id number]  HPI She is here for follow up on a recent hospitalization.she complains of fatigue, malaise.Eating OK. She continues on medications listed in chart. She's had no chest pain, shortness of breath, cough, sore throat.  The hospital record does show some abnormalities with her potassium as well as enzymes and possible renal issues.   Review of Systems     Objective:   Physical Exam alert and in no distress. Tympanic membranes and canals are normal. Throat is clear. Tonsils are normal. Neck is supple without adenopathy or thyromegaly. Cardiac exam shows a regular sinus rhythm without murmurs or gallops. Lungs are clear to auscultation. Abdominal exam shows no masses or tenderness. Urinalysis is negative. The hospital record was reviewed in detail.      Assessment & Plan:  Abnormal transaminases - Plan: Comprehensive metabolic panel  Acute renal failure - Plan: Comprehensive metabolic panel  Hyperkalemia - Plan: Comprehensive metabolic panel  UTI (urinary tract infection) - Plan: POCT Urinalysis Dipstick  Fever and chills - Plan: Comprehensive metabolic panel, CBC with Differential

## 2013-07-27 NOTE — Progress Notes (Signed)
Quick Note:  MAILED PT PT LETTER WITH ADVISED WORDING FROM DR.LALONDE BLOOD WORK LOOKS BETTER THAN WHEN SHE WAS IN HOSPITAL /URINE IS CLEAR/ SYMPTOMS NOT RELATED TO BLOOD WORK OR URINE PER JCL ______

## 2013-08-22 ENCOUNTER — Other Ambulatory Visit: Payer: Self-pay | Admitting: Family Medicine

## 2013-09-03 ENCOUNTER — Other Ambulatory Visit: Payer: Self-pay | Admitting: Family Medicine

## 2013-09-16 ENCOUNTER — Ambulatory Visit (INDEPENDENT_AMBULATORY_CARE_PROVIDER_SITE_OTHER): Payer: BC Managed Care – PPO | Admitting: Family Medicine

## 2013-09-16 ENCOUNTER — Encounter: Payer: Self-pay | Admitting: Family Medicine

## 2013-09-16 VITALS — BP 126/80 | HR 100 | Ht 60.0 in | Wt 175.0 lb

## 2013-09-16 DIAGNOSIS — Z853 Personal history of malignant neoplasm of breast: Secondary | ICD-10-CM

## 2013-09-16 DIAGNOSIS — F329 Major depressive disorder, single episode, unspecified: Secondary | ICD-10-CM

## 2013-09-16 DIAGNOSIS — G43909 Migraine, unspecified, not intractable, without status migrainosus: Secondary | ICD-10-CM

## 2013-09-16 DIAGNOSIS — R7309 Other abnormal glucose: Secondary | ICD-10-CM

## 2013-09-16 DIAGNOSIS — E785 Hyperlipidemia, unspecified: Secondary | ICD-10-CM

## 2013-09-16 DIAGNOSIS — Z1211 Encounter for screening for malignant neoplasm of colon: Secondary | ICD-10-CM

## 2013-09-16 DIAGNOSIS — E669 Obesity, unspecified: Secondary | ICD-10-CM

## 2013-09-16 DIAGNOSIS — Z0000 Encounter for general adult medical examination without abnormal findings: Secondary | ICD-10-CM

## 2013-09-16 DIAGNOSIS — K219 Gastro-esophageal reflux disease without esophagitis: Secondary | ICD-10-CM

## 2013-09-16 DIAGNOSIS — Z23 Encounter for immunization: Secondary | ICD-10-CM

## 2013-09-16 DIAGNOSIS — R7303 Prediabetes: Secondary | ICD-10-CM

## 2013-09-16 DIAGNOSIS — J301 Allergic rhinitis due to pollen: Secondary | ICD-10-CM

## 2013-09-16 LAB — COMPREHENSIVE METABOLIC PANEL
Albumin: 4.8 g/dL (ref 3.5–5.2)
Alkaline Phosphatase: 162 U/L — ABNORMAL HIGH (ref 39–117)
BUN: 11 mg/dL (ref 6–23)
CO2: 30 mEq/L (ref 19–32)
Glucose, Bld: 128 mg/dL — ABNORMAL HIGH (ref 70–99)
Total Bilirubin: 0.6 mg/dL (ref 0.3–1.2)

## 2013-09-16 LAB — LIPID PANEL
Cholesterol: 185 mg/dL (ref 0–200)
HDL: 73 mg/dL (ref >39)
Total CHOL/HDL Ratio: 2.5 Ratio

## 2013-09-16 NOTE — Progress Notes (Signed)
Subjective:    Patient ID: Brandi [name], female    DOB: [birth year], 57 y.o.   MRN: [id number]  HPI She is here for a physical exam. She was seen recently in the emergency room. Review of record indicated a hemoglobin A1c of 6.6 and followup was recommended. She did have a followup appointment in July however canceled it. She continues on her psychotropic medications and is followed by psychiatry. She is scheduled to see her oncologist in January. He does have headaches and apparently does have migraine headaches. When further questioned concerning how she decides when to take the sumatriptan, it was difficult for her to give me a coherent answer. She usually takes the medication when her headaches get really bad. She continues on her PPI for reflux and again difficult to get a good history from her concerning this. Her allergies seem to be under good control.   Review of Systems Negative except as above    Objective:   Physical Exam BP 126/80  Pulse 100  Ht 5' (1.524 m)  Wt 175 lb (79.379 kg)  BMI 34.18 kg/m2  General Appearance:    Alert, cooperative, no distress, appears stated age  Head:    Normocephalic, without obvious abnormality, atraumatic  Eyes:    PERRL, conjunctiva/corneas clear, EOM's intact, fundi    benign  Ears:    Normal TM's and external ear canals  Nose:   Nares normal, mucosa normal, no drainage or sinus   tenderness  Throat:   Lips, mucosa, and tongue normal; teeth and gums normal  Neck:   Supple, no lymphadenopathy;  thyroid:  no   enlargement/tenderness/nodules; no carotid   bruit or JVD  Back:    Spine nontender, no curvature, RHer underlying psychiatric condition makes it very difficult to get a coherent answer from her. Her speech pattern is very halting.OM normal, no CVA     tenderness  Lungs:     Clear to auscultation bilaterally without wheezes, rales or     ronchi; respirations unlabored  Chest Wall:    No tenderness or deformity   Heart:    Regular  rate and rhythm, S1 and S2 normal, no murmur, rub   or gallop  Breast Exam:    Deferred to GYN  Abdomen:     Soft, non-tender, nondistended, normoactive bowel sounds,    no masses, no hepatosplenomegaly  Genitalia:    Deferred to GYN     Extremities:   No clubbing, cyanosis or edema  Pulses:   2+ and symmetric all extremities  Skin:   Skin color, texture, turgor normal, no rashes or lesions  Lymph nodes:   Cervical, supraclavicular, and axillary nodes normal  Neurologic:   CNII-XII intact, normal strength, sensation and gait; reflexes 2+ and symmetric throughout          Psych:   Normal mood, affect, hygiene and grooming.          Assessment & Plan:  Need for prophylactic vaccination and inoculation against influenza - Plan: Flu Vaccine QUAD 36+ mos IM  Routine general medical examination at a health care facility - Plan: Lipid panel, Comprehensive metabolic panel  Obesity (BMI 30-39.9) - Plan: Amb ref to Medical Nutrition Therapy-MNT  Migraine headache  Major depression  Hyperlipidemia LDL goal < 100 - Plan: Lipid panel, Amb ref to Medical Nutrition Therapy-MNT  History of breast cancer  GERD (gastroesophageal reflux disease)  Allergic rhinitis due to pollen  Glucose intolerance (pre-diabetes) - Plan: Amb ref to Medical  Nutrition Therapy-MNT

## 2013-09-16 NOTE — Patient Instructions (Signed)
When you get a headache try Advil or Aleve and if that doesn't relieve it, then  take the sumatriptan

## 2013-10-26 ENCOUNTER — Other Ambulatory Visit: Payer: Self-pay | Admitting: Neurology

## 2013-10-28 NOTE — Telephone Encounter (Signed)
Pt's prescription was faxed over to [facility] at [phone].

## 2013-11-17 ENCOUNTER — Ambulatory Visit (INDEPENDENT_AMBULATORY_CARE_PROVIDER_SITE_OTHER): Payer: BC Managed Care – PPO | Admitting: Medical

## 2013-11-17 ENCOUNTER — Encounter: Payer: Self-pay | Admitting: Medical

## 2013-11-17 VITALS — BP 140/90 | HR 72 | Temp 98.1°F | Resp 16 | Wt 184.0 lb

## 2013-11-17 DIAGNOSIS — H938X3 Other specified disorders of ear, bilateral: Secondary | ICD-10-CM

## 2013-11-17 DIAGNOSIS — H938X9 Other specified disorders of ear, unspecified ear: Secondary | ICD-10-CM

## 2013-11-17 DIAGNOSIS — H6123 Impacted cerumen, bilateral: Secondary | ICD-10-CM

## 2013-11-17 DIAGNOSIS — H6120 Impacted cerumen, unspecified ear: Secondary | ICD-10-CM

## 2013-11-17 NOTE — Progress Notes (Signed)
  Subjective:   Here for complaint of earwax buildup in both ears.  Symptoms include ear fullness x 5 days.  brought in by husband today.  She reports prior history of similar.  No other aggravating or relieving factors.  No other complaint.  Review of Systems Constitutional: denies fever, chills, sweats ENT: no runny nose, ear pain, sore throat, hoarseness, sinus pain, teeth pain, tinnitus, hearing loss Gastroenterology: denies nausea, vomiting     Objective:   Physical Exam  General appearance: alert, no distress, WD/WN Ears: bilat ear canals with impacted cerumen HENT: conjunctiva/corneas normal, sclerae anicteric, nares patent, no discharge or erythema, pharynx normal Oral cavity: MMM, tongue normal, teeth normal Neurological: Hearing normal bilaterally to whisper    Assessment & Plan:    Encounter Diagnosis  Name Primary?  . Impacted cerumen of both ears Yes     Discussed findings.  Discussed risk/benefits of procedure and patient agrees to procedure. Successfully used warm water lavage to remove impacted cerumen from both ear canals. Patient tolerated procedure well. Advised they avoid using any cotton swabs or other devices to clean the ear canals.  Use basic hygiene as discussed.  Follow up prn.

## 2013-11-22 ENCOUNTER — Encounter: Payer: Self-pay | Admitting: Internal Medicine

## 2013-11-30 ENCOUNTER — Telehealth: Payer: Self-pay | Admitting: Internal Medicine

## 2013-11-30 NOTE — Telephone Encounter (Signed)
Faxed over medical records to [facility]

## 2013-12-31 ENCOUNTER — Other Ambulatory Visit: Payer: Self-pay | Admitting: Neurology

## 2014-01-10 ENCOUNTER — Other Ambulatory Visit (HOSPITAL_BASED_OUTPATIENT_CLINIC_OR_DEPARTMENT_OTHER): Payer: BC Managed Care – PPO

## 2014-01-10 ENCOUNTER — Ambulatory Visit (HOSPITAL_BASED_OUTPATIENT_CLINIC_OR_DEPARTMENT_OTHER): Payer: BC Managed Care – PPO | Admitting: Oncology

## 2014-01-10 ENCOUNTER — Encounter (INDEPENDENT_AMBULATORY_CARE_PROVIDER_SITE_OTHER): Payer: Self-pay

## 2014-01-10 ENCOUNTER — Telehealth: Payer: Self-pay | Admitting: *Deleted

## 2014-01-10 ENCOUNTER — Encounter: Payer: Self-pay | Admitting: Oncology

## 2014-01-10 VITALS — BP 164/96 | HR 103 | Temp 98.3°F | Resp 18 | Ht 60.0 in | Wt 179.7 lb

## 2014-01-10 DIAGNOSIS — D473 Essential (hemorrhagic) thrombocythemia: Secondary | ICD-10-CM

## 2014-01-10 DIAGNOSIS — M8580 Other specified disorders of bone density and structure, unspecified site: Secondary | ICD-10-CM

## 2014-01-10 DIAGNOSIS — C50919 Malignant neoplasm of unspecified site of unspecified female breast: Secondary | ICD-10-CM

## 2014-01-10 LAB — CBC WITH DIFFERENTIAL/PLATELET
BASO%: 0.3 % (ref 0.0–2.0)
Basophils Absolute: 0 10*3/uL (ref 0.0–0.1)
EOS%: 3 % (ref 0.0–7.0)
Eosinophils Absolute: 0.3 10*3/uL (ref 0.0–0.5)
HEMATOCRIT: 39.5 % (ref 34.8–46.6)
HGB: 12.6 g/dL (ref 11.6–15.9)
LYMPH#: 2.7 10*3/uL (ref 0.9–3.3)
LYMPH%: 28.1 % (ref 14.0–49.7)
MCH: 26.8 pg (ref 25.1–34.0)
MCHC: 31.9 g/dL (ref 31.5–36.0)
MCV: 83.9 fL (ref 79.5–101.0)
MONO#: 0.6 10*3/uL (ref 0.1–0.9)
MONO%: 6.1 % (ref 0.0–14.0)
NEUT#: 6.1 10*3/uL (ref 1.5–6.5)
NEUT%: 62.5 % (ref 38.4–76.8)
Platelets: 444 10*3/uL — ABNORMAL HIGH (ref 145–400)
RBC: 4.71 10*6/uL (ref 3.70–5.45)
RDW: 14.8 % — ABNORMAL HIGH (ref 11.2–14.5)
WBC: 9.7 10*3/uL (ref 3.9–10.3)

## 2014-01-10 LAB — COMPREHENSIVE METABOLIC PANEL (CC13)
ALT: 62 U/L — AB (ref 0–55)
AST: 40 U/L — AB (ref 5–34)
Albumin: 4.1 g/dL (ref 3.5–5.0)
Alkaline Phosphatase: 162 U/L — ABNORMAL HIGH (ref 40–150)
Anion Gap: 11 mEq/L (ref 3–11)
BILIRUBIN TOTAL: 0.39 mg/dL (ref 0.20–1.20)
BUN: 10.9 mg/dL (ref 7.0–26.0)
CHLORIDE: 101 meq/L (ref 98–109)
CO2: 31 mEq/L — ABNORMAL HIGH (ref 22–29)
CREATININE: 0.8 mg/dL (ref 0.6–1.1)
Calcium: 10.4 mg/dL (ref 8.4–10.4)
Glucose: 93 mg/dl (ref 70–140)
Potassium: 3.6 mEq/L (ref 3.5–5.1)
Sodium: 143 mEq/L (ref 136–145)
Total Protein: 7.7 g/dL (ref 6.4–8.3)

## 2014-01-10 NOTE — Patient Instructions (Signed)
Breast Cancer Survivor Follow-Up Breast cancer begins when cells in the breast divide too rapidly. The extra cells form a lump (tumor). When the cancer is treated, the goal is to get rid of all cancer cells. However, sometimes a few cells survive. These cancer cells can then grow. They become recurrent cancer. This means the cancer comes back after treatment.  Most cases of recurrent breast cancer develop 3 to 5 years after treatment. However, sometimes it comes back just a few months after treatment. Other times, it does not come back until years later. If the cancer comes back in the same area as the first breast cancer, it is called a local recurrence. If the cancer comes back somewhere else in the body, it is called regional recurrence if the site is fairly near the breast or distant recurrence if it is far from the breast. Your caregiver may also use the term metastasize to indicate a cancer that has gone to another part of your body. Treatment is still possible after either kind of recurrence. The cancer can still be controlled.  CAUSES OF RECURRENT CANCER No one knows exactly why breast cancer starts in the first place. Why the cancer comes back after treatment is also not clear. It is known that certain conditions, called risk factors, can make this more likely. They include:  Developing breast cancer for the first time before age 60.  Having breast cancer that involves the lymph nodes. These are small, round pieces of tissue found all over the body. Their job is to help fight infections.  Having a large tumor. Cancer is more apt to come back if the first tumor was bigger than 2 inches (5 cm).  Having certain types of breast cancer, such as:  Inflammatory breast cancer. This rare type grows rapidly and causes the breast to become red and swollen.  A high-grade tumor. The grade of a tumor indicates how fast it will grow and spread. High-grade tumors grow more quickly than other types.  HER2  cancer. This refers to the tumor's genetic makeup. Tumors that have this type of gene are more likely to come back after treatment.  Having close tumor margins. This refers to the space between the tumor and normal, noncancerous cells. If the space is small, the tumor has a greater chance of coming back.  Having treatment involving a surgery to remove the tumor but not the entire breast (lumpectomy) and no radiation therapy. CARE AFTER BREAST CANCER Home Monitoring Women who have had breast cancer should continue to examine their breasts every month. The goal is to catch the cancer quickly if it comes back. Many women find it helpful to do so on the same day each month and to mark the calendar as a reminder. Let your caregiver know immediately if you have any signs of recurrent breast cancer. Symptoms will vary, depending on where the cancer recurs. The original type of treatment can also make a difference. Symptoms of local recurrence after a lumpectomy or a recurrence in the opposite breast may include:  A new lump or thickening in the breast.  A change in the way the skin looks on the breast (such as a rash, dimpling, or wrinkling).  Redness or swelling of the breast.  Changes in the nipple (such as being red, puckered, swollen, or leaking fluid). Symptoms of a recurrence after a breast removal surgery (mastectomy) may include:  A lump or thickening under the skin.  A thickening around the mastectomy scar. Symptoms   of regional recurrence in the lymph nodes near the breast may include:  A lump under the arm or above the collarbone.  Swelling of the arm.  Pain in the arm, shoulder, or chest.  Numbness in the hand or arm. Symptoms of distant recurrence may include:  A cough that does not go away.  Trouble breathing or shortness of breath.  Pain in the bones or the chest. This is pain that lasts or does not respond to rest and medicine.  Headaches.  Sudden vision  problems.  Dizziness.  Nausea or vomiting.  Losing weight without trying to.  Persistent abdominal pain.  Changes in bowel movements or blood in the stool.  Yellowing of the skin or eyes (jaundice).  Blood in the urine or bloody vaginal discharge. Clinical Monitoring  It is helpful to keep a schedule of appointments for needed tests and exams. This includes physical exams, breast exams, exams of the lymph nodes, and general exams.  For the first 3 years after being treated for breast cancer, see your caregiver every 3 to 6 months.  For years 4 and 5 after breast cancer, see your caregiver every 6 to 12 months.  After 5 years, see your caregiver at least once a year.  Regular breast X-rays (mammograms) should continue even if you had a mastectomy.  A mammogram should be done 1 year after the mammogram that first detected breast cancer.  A mammogram should be done every 6 to 12 months after that. Follow your caregiver's advice.  A pelvic exam done by your caregiver checks whether female organs are the normal size and shape. The exam is usually done every year. Ask your caregiver if that schedule is right for you.  Women taking tamoxifen should report any vaginal bleeding immediately to their caregiver. Tamoxifen is often given to women with a certain type of breast cancer. It has been shown to help prevent recurrence.  You will need to decide who your primary caregiver will be.  Most people continue to see their cancer specialist (oncologist) every 3 to 6 months for the first year after cancer treatment.  At some point, you may want to go back to seeing your family caregiver. You would no longer see your oncologist for regular checkups. Many women do this about 1 year after their first diagnosis of breast cancer.  You will still need to be seen every so often by your oncologist. Ask how often that should be. Coordinate this with your family or primary caregiver.  Think about  having genetic counseling. This would provide information on traits that can be passed or inherited from one generation to the next. In some cases, breast cancer runs in families. Tell your caregiver if you:  Are of Ashkenazi Jewish heritage.  Have any family member who has had ovarian cancer.  Have a mother, sister, or daughter who had breast cancer before age 7.  Have 2 or more close relatives who have had breast cancer. This means a mother, sister, daughter, aunt, or grandmother.  Had breast cancer in both breasts.  Have a female relative who has had breast cancer.  Some tests are not recommended for routine screening. Someone recovering from breast cancer does not need to have these tests if there are no problems. The tests have risks, such as radiation exposure, and can be costly. The risks of these tests are thought to be greater than the benefits:  Blood tests.  Chest X-rays.  Bone scans.  Liver ultrasound.  Computed tomography (CT scan).  Positron emission tomography (PET scan).  Magnetic resonance imaging (MRI scan). DIAGNOSIS OF RECURRENT CANCER Recurrent breast cancer may be suspected for various reasons. A mammogram may not look normal. You might feel a lump or have other symptoms. Your caregiver may find something unusual during an exam. To be sure, your caregiver will probably order some tests. The tests are needed because there are symptoms or hints of a problem. They could include:  Blood tests, including a test to check how well the liver is working. The liver is a common site for a distant cancer recurrence.  Imaging tests that create pictures of the inside of the body. These tests include:  Chest X-rays to show if the cancer has come back in the lungs.  CT scans to create detailed pictures of various areas of the body and help find a distant recurrence.  MRI scans to find anything unusual in the breast, chest, or lymph nodes.  Breast ultrasound tests to  examine the breasts.  Bone scans to create a picture of your whole skeleton and find cancer in bony areas.  PET scans to create an image of the whole body. PET scans can be used together with CT scans to show more detail.  Biopsy. A small sample of tissue is taken and checked under a microscope. If cancer cells are found, they may be tested to see if they contain the HER2 gene or the hormones estrogen and progesterone. This will help your caregiver decide how to treat the recurrent cancer. TREATMENT  How recurrent breast cancer is treated depends on where the new cancer is found. The type of treatment that was used for the first breast cancer makes a difference, too. A combination of treatments may be used. Options include:  Surgery.  If the cancer comes back in the breast that was not treated before, you may need a lumpectomy or mastectomy.  If the cancer comes back in the breast that was treated before, you may need a mastectomy.  The lymph nodes under the arm may need to be removed.  Radiation therapy.  For a local recurrence, radiation may be used if it was not used during the first treatment.  For a distance recurrence, radiation is sometimes used.  Chemotherapy.  This may be used before surgery to treat recurrent breast cancer.  This may be used to treat recurrent cancer that cannot be treated with surgery.  This may be used to treat a distant recurrence.  Hormone therapy.  Women with the HER2 gene may be given hormone therapy to attack this gene. Document Released: 07/31/2011 Document Revised: 02/24/2012 Document Reviewed: 07/31/2011 Exit[employer] Patient Information 2014 [employer].  

## 2014-01-10 NOTE — Telephone Encounter (Signed)
appts made and printed. gv appt for [facility]...td 

## 2014-01-10 NOTE — Progress Notes (Signed)
OFFICE PROGRESS NOTE  CC  Brandi Haste, MD [place] [address]  DIAGNOSIS: 58 year old female with history of stage I breast cancer diagnosed in 2010  PRIOR THERAPY:  #1 patient underwent a lumpectomy with sentinel lymph node biopsy of the left breast for a T1 C. No negative disease that was ER positive.  #2 she went on to complete radiation therapy June 2010. Thereafter begun on Arimidex 1 mg daily adjuvantly. 5 years of therapy planned  CURRENT THERAPY:Arimidex 1 mg daily  INTERVAL HISTORY: Brandi [name] 58 y.o. female returns for followup visit today. Overall she's doing well she denies any fevers chills night sweats headaches no chest pains no palpitations no hot flashes. No aches or pains. Remainder of the 10 point review of systems is negative.  MEDICAL HISTORY: Past Medical History  Diagnosis Date  . Dyslipidemia   . Depression     MAJOR  . GERD (gastroesophageal reflux disease)   . Migraines   . Allergic rhinitis   . Schizophrenic disorder   . Tonic-clonic seizure disorder X1  2010--  NO SEIZURE SINCE  . History of breast cancer DX 2010--  S/P LUMPECTOMY AND RADIATION -- NO RECURRENCE    ONCOLOGIST- DR Truddie Coco  . Anemia   . Left ureteral calculus   . Left flank pain   . Arthritis HX RIGHT ANKLE FX    ALLERGIES:  is allergic to penicillins.  MEDICATIONS:  Current Outpatient Prescriptions  Medication Sig Dispense Refill  . anastrozole (ARIMIDEX) 1 MG tablet Take 1 tablet (1 mg total) by mouth daily.  30 tablet  8  . Ascorbic Acid (VITAMIN C) 1000 MG tablet Take 2,000 mg by mouth daily.      Marland Kitchen atorvastatin (LIPITOR) 20 MG tablet TAKE 1 TABLET (20 MG TOTAL) BY MOUTH DAILY.  90 tablet  2  . calcium-vitamin D (OSCAL WITH D) 250-125 MG-UNIT per tablet Take 1 tablet by mouth daily.       . fish oil-omega-3 fatty acids 1000 MG capsule Take 2 g by mouth daily.       . Garlic 123XX123 MG CAPS Take 1,000 mg by mouth daily.       . Ginkgo  Biloba 40 MG TABS Take 40 mg by mouth daily.       Marland Kitchen levETIRAcetam (KEPPRA) 500 MG tablet TAKE 2 TABLETS (1,000 MG TOTAL) BY MOUTH 2 (TWO) TIMES DAILY.  120 tablet  1  . loratadine (CLARITIN) 10 MG tablet Take 10 mg by mouth daily.       Marland Kitchen LORazepam (ATIVAN) 0.5 MG tablet Take 0.5 mg by mouth every evening.       . Lurasidone HCl 120 MG TABS Take 120 mg by mouth at bedtime.      . magnesium 30 MG tablet Take 30 mg by mouth 2 (two) times daily.       . Multiple Vitamins-Minerals (MULTIVITAMIN WITH MINERALS) tablet Take 1 tablet by mouth daily.       . pantoprazole (PROTONIX) 40 MG tablet TAKE 1 TABLET BY MOUTH ONCE A DAY  30 tablet  PRN  . psyllium (REGULOID) 0.52 G capsule Take 0.52 g by mouth daily. 3 TABS IN AM AND 3 TABS AT BED TIME      . Riboflavin (VITAMIN B-2 PO) Take 100 mg by mouth 2 (two) times daily.      Marland Kitchen venlafaxine (EFFEXOR) 75 MG tablet Take 150 mg by mouth 2 (two) times daily. ONE IN THE AM AND ONE  IN PM      . VIMPAT 100 MG TABS TAKE 1 TABLET BY MOUTH TWICE A DAY  180 tablet  3  . SUMAtriptan (IMITREX) 100 MG tablet Take 100 mg by mouth every 2 (two) hours as needed.        No current facility-administered medications for this visit.    SURGICAL HISTORY:  Past Surgical History  Procedure Laterality Date  . Child birth      C-SECTION  . Breast surgery  01-04-2009  [facility]    LEFT BREAST LUMPECTOMY W/ SLN DISSECTION  FOR CANCER  . Cesarean section  1998  . Laparoscopic cholecystectomy  1990'S  . Cystoscopy with retrograde pyelogram, ureteroscopy and stent placement  11/10/2012    Procedure: CYSTOSCOPY WITH RETROGRADE PYELOGRAM, URETEROSCOPY AND STENT PLACEMENT;  Surgeon: Hanley Ben, MD;  Location: [place];  Service: Urology;  Laterality: Left;  WITH DOUBLE J STENT  . Holmium laser application  16/09/9603    Procedure: HOLMIUM LASER APPLICATION;  Surgeon: Hanley Ben, MD;  Location: [place];  Service: Urology;  Laterality:  Left;    REVIEW OF SYSTEMS:  Pertinent items are noted in HPI.   HEALTH MAINTENANCE:  PHYSICAL EXAMINATION: Blood pressure 164/96, pulse 103, temperature 98.3 F (36.8 C), temperature source Oral, resp. rate 18, height 5' (1.524 m), weight 179 lb 11.2 oz (81.511 kg). Body mass index is 35.1 kg/(m^2). ECOG PERFORMANCE STATUS: 0 - Asymptomatic  Patient is well-developed well-nourished female in no acute distress HEENT exam EOMI PERRLA sclerae anicteric no conjunctival pallor oral causes moist neck is supple lungs are clear cardiovascular regular rate rhythm abdomen is soft nontender no HSM extremities no edema neuro patient's alert oriented otherwise nonfocal breast examination left breast no masses or nipple discharge well healed surgical scar right breast no masses or nipple discharge.   LABORATORY DATA: Lab Results  Component Value Date   WBC 9.7 01/10/2014   HGB 12.6 01/10/2014   HCT 39.5 01/10/2014   MCV 83.9 01/10/2014   PLT 444* 01/10/2014      Chemistry      Component Value Date/Time   NA 143 01/10/2014 1330   NA 142 09/16/2013 0842   K 3.6 01/10/2014 1330   K 3.5 09/16/2013 0842   CL 98 09/16/2013 0842   CL 103 10/22/2012 1343   CO2 31* 01/10/2014 1330   CO2 30 09/16/2013 0842   BUN 10.9 01/10/2014 1330   BUN 11 09/16/2013 0842   CREATININE 0.8 01/10/2014 1330   CREATININE 0.82 09/16/2013 0842   CREATININE 0.88 06/30/2013 0500      Component Value Date/Time   CALCIUM 10.4 01/10/2014 1330   CALCIUM 10.5 09/16/2013 0842   ALKPHOS 162* 01/10/2014 1330   ALKPHOS 162* 09/16/2013 0842   AST 40* 01/10/2014 1330   AST 59* 09/16/2013 0842   ALT 62* 01/10/2014 1330   ALT 80* 09/16/2013 0842   BILITOT 0.39 01/10/2014 1330   BILITOT 0.6 09/16/2013 0842       RADIOGRAPHIC STUDIES:  No results found.  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 Patient is status post radiation  therapy. She is now receiving Arimidex 1 mg daily overall she is tolerating it well without any problems. Total of 5 years of therapy is planned  #2 Thrombocytosis: monitor  #3 bone density not recorded in the medical record and patient unsure when  the last one was done.  PLAN:   #1 continue Arimidex 1 mg daily.  #2 order bone density with upcoming mammogram  #3 I will see her back in 6 months time for followup.   All questions were answered. The patient knows to call the clinic with any problems, questions or concerns. We can certainly see the patient much sooner if necessary.  I spent 25 minutes counseling the patient face to face. The total time spent in the appointment was 30 minutes.    Marcy Panning, MD Medical/Oncology [facility] [phone] (beeper) [phone] (Office)

## 2014-01-14 ENCOUNTER — Other Ambulatory Visit: Payer: Self-pay | Admitting: Family Medicine

## 2014-01-21 ENCOUNTER — Other Ambulatory Visit: Payer: Self-pay | Admitting: Family Medicine

## 2014-01-31 ENCOUNTER — Telehealth: Payer: Self-pay | Admitting: Neurology

## 2014-01-31 ENCOUNTER — Other Ambulatory Visit: Payer: Self-pay | Admitting: Neurology

## 2014-01-31 MED ORDER — LACOSAMIDE 100 MG PO TABS: 100.0000 mg | ORAL_TABLET | Freq: Two times a day (BID) | ORAL | Status: DC

## 2014-01-31 NOTE — Telephone Encounter (Signed)
Pt called in and stated that she needs her Vimpat refilled.  Please call if there are any questions.  Thank you

## 2014-02-01 ENCOUNTER — Ambulatory Visit: Payer: BC Managed Care – PPO | Admitting: Neurology

## 2014-03-16 ENCOUNTER — Ambulatory Visit (INDEPENDENT_AMBULATORY_CARE_PROVIDER_SITE_OTHER): Payer: BC Managed Care – PPO | Admitting: Family Medicine

## 2014-03-16 ENCOUNTER — Encounter: Payer: Self-pay | Admitting: Family Medicine

## 2014-03-16 VITALS — BP 130/70 | HR 66 | Wt 173.0 lb

## 2014-03-16 DIAGNOSIS — E669 Obesity, unspecified: Secondary | ICD-10-CM

## 2014-03-16 DIAGNOSIS — M7710 Lateral epicondylitis, unspecified elbow: Secondary | ICD-10-CM

## 2014-03-16 DIAGNOSIS — M7711 Lateral epicondylitis, right elbow: Secondary | ICD-10-CM

## 2014-03-16 DIAGNOSIS — E785 Hyperlipidemia, unspecified: Secondary | ICD-10-CM

## 2014-03-16 DIAGNOSIS — E119 Type 2 diabetes mellitus without complications: Secondary | ICD-10-CM

## 2014-03-16 DIAGNOSIS — F329 Major depressive disorder, single episode, unspecified: Secondary | ICD-10-CM

## 2014-03-16 HISTORY — DX: Type 2 diabetes mellitus without complications: E11.9

## 2014-03-16 LAB — POCT GLYCOSYLATED HEMOGLOBIN (HGB A1C): HEMOGLOBIN A1C: 6.5

## 2014-03-16 NOTE — Patient Instructions (Addendum)
Do as many things as you can palms up and open. You can take Tylenol or Advil for this. If it keeps bothering you come on back. Called the nutritionist again. Check your blood sugars either before a meal or 2 hours after a meal. Start exercising.

## 2014-03-16 NOTE — Progress Notes (Signed)
   Subjective:    Patient ID: Brandi [name], female    DOB: 05-21-1956, 58 y.o.   MRN: [id number]  HPI He is here for recheck. Her main complaint is right elbow pain. She cannot give a good history as to whether she injured this or not. He states that it hurts all the time and cannot relate it to any particular movements. She has not been into nutrition/diabetes. Apparently there have been some scheduling conflicts. She continues to be followed by her psychiatrist and psychologist. She does not check her sugars day she does not have a glucometer. Her exercise is minimal. She would like a referral to podiatry as she has trouble taking care of her feet. Smoking and drinking were reviewed.  Review of Systems     Objective:   Physical Exam  Alert and in no distress. Exam of the right elbow shows full motion. No tenderness over the lateral epicondyle but provocative testing did cause discomfort in her extensor mechanism. Hemoglobin A1c is 6.5.      Assessment & Plan:  Type II or unspecified type diabetes mellitus without mention of complication, not stated as uncontrolled  Lateral epicondylitis of right elbow  Major depression  Obesity (BMI 30-39.9)  Hyperlipidemia LDL goal < 100  discussed the need for her to increase her physical activity. Glucometer was given with instructions on proper use. Recommend she call the nutritionist again to set up a time to going for counseling. She will continue to be followed by her psychiatrist. Discussed treatment of her right elbow discomfort. Recommended doing as many things pounds up and open. Followup here in 4 months.

## 2014-03-17 ENCOUNTER — Ambulatory Visit (INDEPENDENT_AMBULATORY_CARE_PROVIDER_SITE_OTHER): Payer: BC Managed Care – PPO | Admitting: Family Medicine

## 2014-03-17 DIAGNOSIS — Z79899 Other long term (current) drug therapy: Secondary | ICD-10-CM

## 2014-03-21 ENCOUNTER — Telehealth: Payer: Self-pay | Admitting: Family Medicine

## 2014-03-21 NOTE — Telephone Encounter (Signed)
Needs test strips   One touch verio   Test strips    90 day supply  [phone]

## 2014-03-21 NOTE — Telephone Encounter (Signed)
Go ahead and call these in

## 2014-03-22 MED ORDER — GLUCOSE BLOOD VI STRP: ORAL_STRIP | Status: DC

## 2014-03-22 NOTE — Telephone Encounter (Signed)
Done

## 2014-03-23 ENCOUNTER — Telehealth: Payer: Self-pay | Admitting: Family Medicine

## 2014-03-23 DIAGNOSIS — E119 Type 2 diabetes mellitus without complications: Secondary | ICD-10-CM

## 2014-03-23 NOTE — Telephone Encounter (Signed)
Set her up to see her for diabetes and nutrition specialist.

## 2014-03-24 NOTE — Telephone Encounter (Signed)
Referral sent and they will call pt to schedule appt.

## 2014-03-31 NOTE — Progress Notes (Signed)
   Subjective:    Patient ID: Brandi [name], female    DOB: 1956/11/19, 58 y.o.   MRN: [id number]  HPI    Review of Systems     Objective:   Physical Exam        Assessment & Plan:

## 2014-04-14 LAB — HM DEXA SCAN

## 2014-04-21 ENCOUNTER — Encounter: Payer: Self-pay | Admitting: Internal Medicine

## 2014-04-25 ENCOUNTER — Telehealth: Payer: Self-pay | Admitting: Family Medicine

## 2014-04-25 ENCOUNTER — Encounter: Payer: Self-pay | Admitting: Internal Medicine

## 2014-04-25 MED ORDER — PANTOPRAZOLE SODIUM 40 MG PO TBEC: 40.0000 mg | DELAYED_RELEASE_TABLET | Freq: Every day | ORAL | Status: DC

## 2014-04-25 NOTE — Telephone Encounter (Signed)
Medication refilled

## 2014-04-25 NOTE — Progress Notes (Signed)
Bone density report rcvd from [facility] 04/14/14.  Original sent to scan.  Copy to [place].

## 2014-04-26 ENCOUNTER — Telehealth: Payer: Self-pay | Admitting: Family Medicine

## 2014-04-27 ENCOUNTER — Ambulatory Visit: Payer: BC Managed Care – PPO

## 2014-04-27 NOTE — Telephone Encounter (Signed)
P.A. PANTOPRAZOLE approved til 04/26/15, pt informed, faxed pharmacy

## 2014-04-29 ENCOUNTER — Other Ambulatory Visit: Payer: Self-pay | Admitting: Oncology

## 2014-05-04 ENCOUNTER — Ambulatory Visit: Payer: BC Managed Care – PPO

## 2014-05-10 ENCOUNTER — Ambulatory Visit (INDEPENDENT_AMBULATORY_CARE_PROVIDER_SITE_OTHER): Payer: BC Managed Care – PPO | Admitting: Family Medicine

## 2014-05-10 ENCOUNTER — Encounter: Payer: Self-pay | Admitting: Family Medicine

## 2014-05-10 VITALS — BP 120/72 | HR 80 | Wt 173.0 lb

## 2014-05-10 DIAGNOSIS — E119 Type 2 diabetes mellitus without complications: Secondary | ICD-10-CM

## 2014-05-10 DIAGNOSIS — B351 Tinea unguium: Secondary | ICD-10-CM

## 2014-05-10 MED ORDER — TERBINAFINE HCL 250 MG PO TABS
250.0000 mg | ORAL_TABLET | Freq: Every day | ORAL | Status: DC
Start: 2014-05-10 — End: 2015-05-17

## 2014-05-11 ENCOUNTER — Encounter: Payer: Self-pay | Admitting: Family Medicine

## 2014-05-11 ENCOUNTER — Telehealth: Payer: Self-pay | Admitting: Family Medicine

## 2014-05-11 ENCOUNTER — Ambulatory Visit: Payer: BC Managed Care – PPO

## 2014-05-11 NOTE — Progress Notes (Signed)
   Subjective:    Patient ID: Brandi [name], female    DOB: 1956-11-29, 58 y.o.   MRN: [id number]  HPI She is here for consult concerning possible ingrown right great toenail. She did have the left great toenail removed after difficulty with an ingrown nail. She does have underlying diabetes.   Review of Systems     Objective:   Physical Exam Exam of the right great toe does show thickening and discoloration with slight erythema but nontender to palpation. Several other toenails do show thickening on both feet.       Assessment & Plan:  Onychomycosis of toenail - Plan: terbinafine (LAMISIL) 250 MG tablet  Type II or unspecified type diabetes mellitus without mention of complication, not stated as uncontrolled  I discussed the fact that she does not have an ingrown nail but is certainly susceptible to this due to the thickening of her nails. I will place her on Lamisil and monitor her liver function. Recheck here in one month for blood studies.

## 2014-05-11 NOTE — Telephone Encounter (Signed)
Please call Rx Dr. Redmond School gave yesterday for toenail fungus that she is supposed to take for 6 months needs prior authorization per [facility] in [facility]

## 2014-05-13 ENCOUNTER — Telehealth: Payer: Self-pay | Admitting: Family Medicine

## 2014-05-16 NOTE — Telephone Encounter (Signed)
lm

## 2014-05-18 ENCOUNTER — Other Ambulatory Visit: Payer: Self-pay | Admitting: *Deleted

## 2014-05-18 ENCOUNTER — Telehealth: Payer: Self-pay | Admitting: *Deleted

## 2014-05-18 DIAGNOSIS — Z853 Personal history of malignant neoplasm of breast: Secondary | ICD-10-CM

## 2014-05-18 MED ORDER — CALCIUM CARBONATE-VITAMIN D 250-125 MG-UNIT PO TABS: 2.0000 | ORAL_TABLET | Freq: Every day | ORAL | Status: DC

## 2014-05-18 NOTE — Telephone Encounter (Signed)
Per Mendel Ryder, NP, I informed patient about her bone density results. Instructed patient to continue taking Calcium, Vitamin D and doing weight bearing exercises. Patient verbalized understanding.

## 2014-05-31 ENCOUNTER — Encounter: Payer: BC Managed Care – PPO | Attending: Family Medicine

## 2014-05-31 VITALS — Ht 60.0 in | Wt 188.1 lb

## 2014-05-31 DIAGNOSIS — E119 Type 2 diabetes mellitus without complications: Secondary | ICD-10-CM | POA: Diagnosis present

## 2014-05-31 DIAGNOSIS — Z713 Dietary counseling and surveillance: Secondary | ICD-10-CM | POA: Diagnosis not present

## 2014-06-07 DIAGNOSIS — E119 Type 2 diabetes mellitus without complications: Secondary | ICD-10-CM

## 2014-06-07 NOTE — Progress Notes (Signed)
Patient was seen on 05/31/14 for the first of a series of three diabetes self-management courses at [facility].  Current HbA1c: 6.6%  The following learning objectives were met by the patient during this class:  Describe diabetes  State some common risk factors for diabetes  Defines the role of glucose and insulin  Identifies type of diabetes and pathophysiology  Describe the relationship between diabetes and cardiovascular risk  State the members of the Healthcare Team  States the rationale for glucose monitoring  State when to test glucose  State their individual Target Range  State the importance of logging glucose readings  Describe how to interpret glucose readings  Identifies A1C target  Explain the correlation between A1c and eAG values  State symptoms and treatment of high blood glucose  State symptoms and treatment of low blood glucose  Explain proper technique for glucose testing  Identifies proper sharps disposal  Handouts given during class include:  Living Well with Diabetes book  Carb Counting and Meal Planning book  Meal Plan Card  Carbohydrate guide  Meal planning worksheet  Low Sodium Flavoring Tips  The diabetes portion plate  U8A to eAG Conversion Chart  Diabetes Medications  Diabetes Recommended Care Schedule  Support Group  Diabetes Success Plan  Core Class Satisfaction Survey  Follow-Up Plan:  Attend core 2

## 2014-06-07 NOTE — Progress Notes (Signed)

## 2014-06-10 ENCOUNTER — Encounter: Payer: Self-pay | Admitting: Family Medicine

## 2014-06-10 ENCOUNTER — Ambulatory Visit (INDEPENDENT_AMBULATORY_CARE_PROVIDER_SITE_OTHER): Payer: BC Managed Care – PPO | Admitting: Family Medicine

## 2014-06-10 VITALS — BP 120/70 | Wt 180.0 lb

## 2014-06-10 DIAGNOSIS — B351 Tinea unguium: Secondary | ICD-10-CM

## 2014-06-10 DIAGNOSIS — Z79899 Other long term (current) drug therapy: Secondary | ICD-10-CM

## 2014-06-10 LAB — COMPREHENSIVE METABOLIC PANEL
ALK PHOS: 133 U/L — AB (ref 39–117)
ALT: 28 U/L (ref 0–35)
AST: 23 U/L (ref 0–37)
Albumin: 4.7 g/dL (ref 3.5–5.2)
BILIRUBIN TOTAL: 0.7 mg/dL (ref 0.2–1.2)
BUN: 12 mg/dL (ref 6–23)
CO2: 31 mEq/L (ref 19–32)
CREATININE: 0.77 mg/dL (ref 0.50–1.10)
Calcium: 10.1 mg/dL (ref 8.4–10.5)
Chloride: 98 mEq/L (ref 96–112)
Glucose, Bld: 102 mg/dL — ABNORMAL HIGH (ref 70–99)
Potassium: 3.5 mEq/L (ref 3.5–5.3)
Sodium: 143 mEq/L (ref 135–145)
Total Protein: 7.3 g/dL (ref 6.0–8.3)

## 2014-06-10 NOTE — Progress Notes (Signed)
   Subjective:    Patient ID: Brandi [name], female    DOB: 1956-06-01, 58 y.o.   MRN: [id number]  HPI She is here for a recheck. She is taking Lamisil and having no difficulty with this. She has not noted any great improvement in her toenails.   Review of Systems     Objective:   Physical Exam Alert and in no distress. Exam of her toenails shows minimal if any clearing.       Assessment & Plan:  Onychomycosis of toenail - Plan: Comprehensive metabolic panel  Encounter for long-term (current) use of other medications - Plan: Comprehensive metabolic panel  will recheck her toenails when she comes in for her routine diabetes check in several months.

## 2014-06-14 DIAGNOSIS — E119 Type 2 diabetes mellitus without complications: Secondary | ICD-10-CM

## 2014-06-14 NOTE — Progress Notes (Signed)
Patient was seen on 06/14/14 for the third of a series of three diabetes self-management courses at the [facility]. The following learning objectives were met by the patient during this class:    State the amount of activity recommended for healthy living   Describe activities suitable for individual needs   Identify ways to regularly incorporate activity into daily life   Identify barriers to activity and ways to over come these barriers  Identify diabetes medications being personally used and their primary action for lowering glucose and possible side effects   Describe role of stress on blood glucose and develop strategies to address psychosocial issues   Identify diabetes complications and ways to prevent them  Explain how to manage diabetes during illness   Evaluate success in meeting personal goal   Establish 2-3 goals that they will plan to diligently work on until they return for the  59-monthfollow-up visit  Goals:  Follow Diabetes Meal Plan as instructed  Aim for 15-30 mins of physical activity daily as tolerated  Bring food record and glucose log to your follow up visit  Your patient has established the following 4 month goals in their individualized success plan: I will increase my activity level at least 30 minutes a day, 3 days a week  Your patient has identified these potential barriers to change:  None stated  Your patient has identified their diabetes self-care support plan as  [facility]  Family support  Plan:  Attend Core 4 in 4 months

## 2014-06-14 NOTE — Telephone Encounter (Signed)
Closing encounter

## 2014-06-23 ENCOUNTER — Telehealth: Payer: Self-pay | Admitting: Hematology and Oncology

## 2014-06-25 ENCOUNTER — Other Ambulatory Visit: Payer: Self-pay | Admitting: Oncology

## 2014-06-25 DIAGNOSIS — Z853 Personal history of malignant neoplasm of breast: Secondary | ICD-10-CM

## 2014-06-27 NOTE — Telephone Encounter (Signed)
FU scheduled for November

## 2014-07-14 ENCOUNTER — Ambulatory Visit: Payer: BC Managed Care – PPO | Admitting: Oncology

## 2014-07-14 ENCOUNTER — Other Ambulatory Visit: Payer: BC Managed Care – PPO

## 2014-07-18 ENCOUNTER — Encounter: Payer: Self-pay | Admitting: Family Medicine

## 2014-07-18 ENCOUNTER — Ambulatory Visit (INDEPENDENT_AMBULATORY_CARE_PROVIDER_SITE_OTHER): Payer: BC Managed Care – PPO | Admitting: Family Medicine

## 2014-07-18 VITALS — BP 122/82 | HR 94 | Wt 180.0 lb

## 2014-07-18 DIAGNOSIS — E785 Hyperlipidemia, unspecified: Secondary | ICD-10-CM

## 2014-07-18 DIAGNOSIS — E119 Type 2 diabetes mellitus without complications: Secondary | ICD-10-CM

## 2014-07-18 DIAGNOSIS — B351 Tinea unguium: Secondary | ICD-10-CM

## 2014-07-18 DIAGNOSIS — E669 Obesity, unspecified: Secondary | ICD-10-CM

## 2014-07-18 LAB — POCT GLYCOSYLATED HEMOGLOBIN (HGB A1C): Hemoglobin A1C: 6.5

## 2014-07-18 MED ORDER — LISINOPRIL 5 MG PO TABS: 5.0000 mg | ORAL_TABLET | Freq: Every day | ORAL | Status: DC

## 2014-07-18 NOTE — Progress Notes (Signed)
Subjective:    Brandi [name] is a 58 y.o. female who presents for follow-up of Type 2 diabetes mellitus.  She continues on her Lamisil for the onychomycosis. She is concerned over lack of progress.  Home blood sugar records: 89 TO 163   Current symptoms/problems NONE Daily foot checks:  Any foot concerns: YES/ TONAILS Last eye exam:  DR.MAQUEEN [place] OPTH 2014   Medication compliance: Good Current diet: EATING A LOT MORE VEG. Current exercise: WALKING, STATIONARY BIKE. However she does not do this regularly. Known diabetic complications: none Cardiovascular risk factors: advanced age (older than 2 for men, 39 for women), diabetes mellitus, dyslipidemia, hypertension, obesity (BMI >= 30 kg/m2) and sedentary lifestyle   The following portions of the patient's history were reviewed and updated as appropriate: allergies, current medications, past family history, past medical history, past social history and problem list.  ROS as in subjective above    Objective:    General appearence: alert, no distress, WD/WN Foot exam: Thickening of the nails is noted bilaterally.  Lab Review Lab Results  Component Value Date   HGBA1C 6.5 03/16/2014   Lab Results  Component Value Date   CHOL 185 09/16/2013   HDL 73 09/16/2013   LDLCALC 67 09/16/2013   TRIG 227* 09/16/2013   CHOLHDL 2.5 09/16/2013   No results found for this basename: Derl Barrow     Chemistry      Component Value Date/Time   NA 143 06/10/2014 1512   NA 143 01/10/2014 1330   K 3.5 06/10/2014 1512   K 3.6 01/10/2014 1330   CL 98 06/10/2014 1512   CL 103 10/22/2012 1343   CO2 31 06/10/2014 1512   CO2 31* 01/10/2014 1330   BUN 12 06/10/2014 1512   BUN 10.9 01/10/2014 1330   CREATININE 0.77 06/10/2014 1512   CREATININE 0.8 01/10/2014 1330   CREATININE 0.88 06/30/2013 0500      Component Value Date/Time   CALCIUM 10.1 06/10/2014 1512   CALCIUM 10.4 01/10/2014 1330   ALKPHOS 133* 06/10/2014 1512   ALKPHOS 162*  01/10/2014 1330   AST 23 06/10/2014 1512   AST 40* 01/10/2014 1330   ALT 28 06/10/2014 1512   ALT 62* 01/10/2014 1330   BILITOT 0.7 06/10/2014 1512   BILITOT 0.39 01/10/2014 1330        Chemistry      Component Value Date/Time   NA 143 06/10/2014 1512   NA 143 01/10/2014 1330   K 3.5 06/10/2014 1512   K 3.6 01/10/2014 1330   CL 98 06/10/2014 1512   CL 103 10/22/2012 1343   CO2 31 06/10/2014 1512   CO2 31* 01/10/2014 1330   BUN 12 06/10/2014 1512   BUN 10.9 01/10/2014 1330   CREATININE 0.77 06/10/2014 1512   CREATININE 0.8 01/10/2014 1330   CREATININE 0.88 06/30/2013 0500      Component Value Date/Time   CALCIUM 10.1 06/10/2014 1512   CALCIUM 10.4 01/10/2014 1330   ALKPHOS 133* 06/10/2014 1512   ALKPHOS 162* 01/10/2014 1330   AST 23 06/10/2014 1512   AST 40* 01/10/2014 1330   ALT 28 06/10/2014 1512   ALT 62* 01/10/2014 1330   BILITOT 0.7 06/10/2014 1512   BILITOT 0.39 01/10/2014 1330      Hemoglobin A1c is 6.5  Assessment:   Encounter Diagnoses  Name Primary?  . Type II or unspecified type diabetes mellitus without mention of complication, not stated as uncontrolled Yes  . Obesity (BMI 30-39.9)   .  Hyperlipidemia with target LDL less than 100   . Onychomycosis        Plan:    1.  Rx changes: Lisinopril 5 mg 2.  Education: Reviewed 'ABCs' of diabetes management (respective goals in parentheses):  A1C (<7), blood pressure (<130/80), and cholesterol (LDL <100). 3.  Compliance at present is estimated to be good. Efforts to improve compliance (if necessary) will be directed at increased exercise. 4. Follow up: 4 months   Continue on the Lamisil. Encouraged her to do something physical daily. Explained that adding lisinopril was to help with preserving kidney function.

## 2014-07-19 ENCOUNTER — Telehealth: Payer: Self-pay | Admitting: *Deleted

## 2014-07-19 ENCOUNTER — Other Ambulatory Visit: Payer: Self-pay | Admitting: Neurology

## 2014-07-19 NOTE — Telephone Encounter (Signed)
Patient left VM stating she has been having pain under left armpit where lymph nodes were removed several years ago. Called patient back to get more information and patient denies any redness, heat or swelling under left arm. States it usually is an intermittent throbbing pain at night and has been happening for several weeks off and on. Informed patient that if she has been using arm more or exercising this could make armpit sore. States she has been exercising "a little." Encouraged patient to try OTC tylenol or ibprofen to see if that alleviates discomfort. Offered to make referral to the outpatient lymphedema clinic and patient declined. Told patient to call us back if pain worsens or armpit becomes red and inflamed. Patient agreeable to this.

## 2014-07-20 ENCOUNTER — Other Ambulatory Visit: Payer: Self-pay

## 2014-07-20 MED ORDER — LACOSAMIDE 100 MG PO TABS: 100.0000 mg | ORAL_TABLET | Freq: Two times a day (BID) | ORAL | Status: DC

## 2014-07-20 NOTE — Telephone Encounter (Signed)
Dr Willis is out of the office.  Forwarding request to [facility] for approval.  

## 2014-07-20 NOTE — Telephone Encounter (Signed)
Rx signed and faxed.

## 2014-07-27 ENCOUNTER — Ambulatory Visit: Payer: BC Managed Care – PPO | Admitting: Neurology

## 2014-08-12 ENCOUNTER — Other Ambulatory Visit: Payer: Self-pay | Admitting: Family Medicine

## 2014-08-30 ENCOUNTER — Telehealth: Payer: Self-pay | Admitting: *Deleted

## 2014-08-30 NOTE — Telephone Encounter (Signed)
Called and offered appointment today. Patient declined and will keep Nov appointment.

## 2014-09-19 ENCOUNTER — Encounter: Payer: Self-pay | Admitting: Family Medicine

## 2014-09-19 ENCOUNTER — Ambulatory Visit (INDEPENDENT_AMBULATORY_CARE_PROVIDER_SITE_OTHER): Payer: BC Managed Care – PPO | Admitting: Family Medicine

## 2014-09-19 VITALS — BP 122/82 | HR 60 | Ht 59.5 in | Wt 178.0 lb

## 2014-09-19 DIAGNOSIS — E669 Obesity, unspecified: Secondary | ICD-10-CM

## 2014-09-19 DIAGNOSIS — G43009 Migraine without aura, not intractable, without status migrainosus: Secondary | ICD-10-CM

## 2014-09-19 DIAGNOSIS — J301 Allergic rhinitis due to pollen: Secondary | ICD-10-CM

## 2014-09-19 DIAGNOSIS — F321 Major depressive disorder, single episode, moderate: Secondary | ICD-10-CM

## 2014-09-19 DIAGNOSIS — Z0000 Encounter for general adult medical examination without abnormal findings: Secondary | ICD-10-CM

## 2014-09-19 DIAGNOSIS — K219 Gastro-esophageal reflux disease without esophagitis: Secondary | ICD-10-CM

## 2014-09-19 DIAGNOSIS — E785 Hyperlipidemia, unspecified: Secondary | ICD-10-CM

## 2014-09-19 DIAGNOSIS — Z23 Encounter for immunization: Secondary | ICD-10-CM

## 2014-09-19 DIAGNOSIS — Z853 Personal history of malignant neoplasm of breast: Secondary | ICD-10-CM

## 2014-09-19 LAB — LIPID PANEL
Cholesterol: 187 mg/dL (ref 0–200)
HDL: 74 mg/dL (ref >39)
LDL CALC: 67 mg/dL (ref 0–99)
Total CHOL/HDL Ratio: 2.5 Ratio
Triglycerides: 232 mg/dL — ABNORMAL HIGH (ref <150)
VLDL: 46 mg/dL — AB (ref 0–40)

## 2014-09-19 NOTE — Patient Instructions (Signed)
Use your stationary bike or walk outside everyday

## 2014-09-19 NOTE — Progress Notes (Signed)
   Subjective:    Patient ID: Brandi [name], female    DOB: Sep 09, 1956, 58 y.o.   MRN: [id number]  HPI She is here for complete examination. She does complain of vague left arm discomfort for the last several months but cannot relate this to any motion, chest pain, position. She is scheduled to see oncology in the near future. She also complains of a very vague abdominal distress but does not relate this to reflux symptoms which she has. She continues on her PPI and uses this on a regular basis. She is followed by psychiatry for her underlying depression and seems to be doing fairly stable on this. Her migraines give her very little difficulty. Allergies are under good control. She continues on medications for her underlying blood pressure, lipids and diabetes. Her last A1c was 2 months ago and was 6.5. Social and family history were reviewed. She is now involved in a health maintenance program specifically to help with her weight  Review of Systems  All other systems reviewed and are negative.      Objective:   Physical Exam BP 122/82  Pulse 60  Ht 4' 11.5" (1.511 m)  Wt 178 lb (80.74 kg)  BMI 35.36 kg/m2  General Appearance:    Alert, cooperative, no distress, appears stated age  Head:    Normocephalic, without obvious abnormality, atraumatic  Eyes:    PERRL, conjunctiva/corneas clear, EOM's intact, fundi    benign  Ears:    Normal TM's and external ear canals  Nose:   Nares normal, mucosa normal, no drainage or siEncouraged her to become more physically active. She is now involved in a program for that.nus   tenderness  Throat:   Lips, mucosa, and tongue normal; teeth and gums normal  Neck:   Supple, no lymphadenopathy;  thyroid:  no   enlargement/tenderness/nodules; no carotid   bruit or JVD  Back:    Spine nontender, no curvature, ROM normal, no CVA     tenderness  Lungs:     Clear to auscultation bilaterally without wheezes, rales or     ronchi; respirations unlabored  Chest  Wall:    No tenderness or deformity   Heart:    Regular rate and rhythm, S1 and S2 normal, no murmur, rub   or gallop  Breast Exam:    Deferred to GYN  Abdomen:     Soft, non-tender, nondistended, normoactive bowel sounds,    no masses, no hepatosplenomegaly  Genitalia:    Deferred to GYN     Extremities:   No clubbing, cyanosis or edema  Pulses:   2+ and symmetric all extremities  Skin:   Skin color, texture, turgor normal, no rashes or lesions  Lymph nodes:   Cervical, supraclavicular, and axillary nodes normal  Neurologic:   CNII-XII intact, normal strength, sensation and gait; reflexes 2+ and symmetric throughout          Psych:   Normal mood, affect, hygiene and grooming.          Assessment & Plan:  Routine general medical examination at a health care facility  Immunization due - Plan: Flu Vaccine QUAD 36+ mos IM, Pneumococcal conjugate vaccine 13-valent  Major depressive disorder, single episode, moderate  Obesity (BMI 30-39.9)  Migraine without aura and without status migrainosus, not intractable  Gastroesophageal reflux disease without esophagitis  Allergic rhinitis due to pollen  History of breast cancer  Hyperlipidemia with target LDL less than 100 - Plan: Lipid panel

## 2014-09-26 LAB — HM DIABETES EYE EXAM

## 2014-09-27 ENCOUNTER — Encounter: Payer: Self-pay | Admitting: Internal Medicine

## 2014-10-17 ENCOUNTER — Ambulatory Visit: Payer: BC Managed Care – PPO

## 2014-10-21 ENCOUNTER — Other Ambulatory Visit: Payer: Self-pay

## 2014-10-21 ENCOUNTER — Telehealth: Payer: Self-pay | Admitting: Family Medicine

## 2014-10-21 DIAGNOSIS — Z853 Personal history of malignant neoplasm of breast: Secondary | ICD-10-CM

## 2014-10-21 NOTE — Telephone Encounter (Signed)
Pt called and stated that she believed she was sick from the flu shot. When I questioned her she told me she had the shot a month ago, 09/19/2014. Pt was informed that at this late date it was unlikely it was flu slot related. Pt was told that she probably needed to come in but we didn't have availability until Monday. Pt was offered an appt that she declined. Pt was informed that if she was sick and couldn't wait until Monday that she would need to visit an urgent care.

## 2014-10-24 ENCOUNTER — Telehealth: Payer: Self-pay | Admitting: Hematology and Oncology

## 2014-10-24 ENCOUNTER — Ambulatory Visit (HOSPITAL_BASED_OUTPATIENT_CLINIC_OR_DEPARTMENT_OTHER): Payer: BC Managed Care – PPO | Admitting: Hematology and Oncology

## 2014-10-24 ENCOUNTER — Other Ambulatory Visit (HOSPITAL_BASED_OUTPATIENT_CLINIC_OR_DEPARTMENT_OTHER): Payer: BC Managed Care – PPO

## 2014-10-24 VITALS — BP 135/95 | HR 98 | Temp 98.4°F | Resp 18 | Ht 59.5 in | Wt 188.4 lb

## 2014-10-24 DIAGNOSIS — Z853 Personal history of malignant neoplasm of breast: Secondary | ICD-10-CM

## 2014-10-24 DIAGNOSIS — C50412 Malignant neoplasm of upper-outer quadrant of left female breast: Secondary | ICD-10-CM

## 2014-10-24 LAB — CBC WITH DIFFERENTIAL/PLATELET
BASO%: 0.2 % (ref 0.0–2.0)
Basophils Absolute: 0 10*3/uL (ref 0.0–0.1)
EOS%: 1.8 % (ref 0.0–7.0)
Eosinophils Absolute: 0.2 10*3/uL (ref 0.0–0.5)
HCT: 40.5 % (ref 34.8–46.6)
HGB: 13.2 g/dL (ref 11.6–15.9)
LYMPH%: 24.6 % (ref 14.0–49.7)
MCH: 26.3 pg (ref 25.1–34.0)
MCHC: 32.6 g/dL (ref 31.5–36.0)
MCV: 80.7 fL (ref 79.5–101.0)
MONO#: 0.8 10*3/uL (ref 0.1–0.9)
MONO%: 7.8 % (ref 0.0–14.0)
NEUT#: 6.9 10*3/uL — ABNORMAL HIGH (ref 1.5–6.5)
NEUT%: 65.6 % (ref 38.4–76.8)
Platelets: 413 10*3/uL — ABNORMAL HIGH (ref 145–400)
RBC: 5.02 10*6/uL (ref 3.70–5.45)
RDW: 15.3 % — AB (ref 11.2–14.5)
WBC: 10.5 10*3/uL — AB (ref 3.9–10.3)
lymph#: 2.6 10*3/uL (ref 0.9–3.3)

## 2014-10-24 LAB — COMPREHENSIVE METABOLIC PANEL (CC13)
ALT: 31 U/L (ref 0–55)
AST: 21 U/L (ref 5–34)
Albumin: 4.1 g/dL (ref 3.5–5.0)
Alkaline Phosphatase: 138 U/L (ref 40–150)
Anion Gap: 11 mEq/L (ref 3–11)
BUN: 14.9 mg/dL (ref 7.0–26.0)
CO2: 31 meq/L — AB (ref 22–29)
Calcium: 10.7 mg/dL — ABNORMAL HIGH (ref 8.4–10.4)
Chloride: 102 mEq/L (ref 98–109)
Creatinine: 0.8 mg/dL (ref 0.6–1.1)
Glucose: 116 mg/dl (ref 70–140)
Potassium: 3.5 mEq/L (ref 3.5–5.1)
SODIUM: 144 meq/L (ref 136–145)
TOTAL PROTEIN: 7.6 g/dL (ref 6.4–8.3)
Total Bilirubin: 0.44 mg/dL (ref 0.20–1.20)

## 2014-10-24 NOTE — Telephone Encounter (Signed)
per pof to sch pt appt-gave pt copy of sch °

## 2014-10-24 NOTE — Progress Notes (Signed)
Patient Care Team: Denita Lung, MD as PCP - General (Family Medicine)  DIAGNOSIS: 58 year old female with history of stage I breast cancer diagnosed in 2010  PRIOR THERAPY:  #1 patient underwent a lumpectomy with sentinel lymph node biopsy of the left breast for a T1 C. No negative disease that was ER positive.  #2 she went on to complete radiation therapy June 2010. Thereafter begun on Arimidex 1 mg daily adjuvantly. 5 years of therapy completed November 2015  CHIEF COMPLIANT: hot flashes and discomfort underneath the left axilla  INTERVAL HISTORY: Brandi [name] is a  58 year old Caucasian with above-mentioned history ofleft-sided breast cancer who is here for a followup. She reports no major problems other than hot flashes. She has been taking Arimidex for the past 5 years and has been otherwise doing well. She has mild discomfort in the left axilla which has been bothering her recently. She denies any lumps or nodules in breast. She has recently had an upper respiratory tract infection.  REVIEW OF SYSTEMS:   Constitutional: Denies fevers, chills or abnormal weight loss Eyes: Denies blurriness of vision Ears, nose, mouth, throat, and face: Denies mucositis or sore throat Respiratory: Denies cough, dyspnea or wheezes Cardiovascular: Denies palpitation, chest discomfort or lower extremity swelling Gastrointestinal:  Denies nausea, heartburn or change in bowel habits Skin: Denies abnormal skin rashes Lymphatics: Denies new lymphadenopathy or easy bruising Neurological:Denies numbness, tingling or new weaknesses Behavioral/Psych: Mood is stable, no new changes  Breast:  Left axillary pain All other systems were reviewed with the patient and are negative.  I have reviewed the past medical history, past surgical history, social history and family history with the patient and they are unchanged from previous note.  ALLERGIES:  is allergic to penicillins.  MEDICATIONS:  Current  Outpatient Prescriptions  Medication Sig Dispense Refill  . anastrozole (ARIMIDEX) 1 MG tablet TAKE 1 TABLET BY MOUTH DAILY. 30 tablet 5  . Ascorbic Acid (VITAMIN C) 1000 MG tablet Take 2,000 mg by mouth daily.    Marland Kitchen atorvastatin (LIPITOR) 20 MG tablet TAKE 1 TABLET (20 MG TOTAL) BY MOUTH DAILY. 90 tablet 2  . calcium-vitamin D (OSCAL WITH D) 250-125 MG-UNIT per tablet Take 2 tablets by mouth daily. 30 tablet 11  . fish oil-omega-3 fatty acids 1000 MG capsule Take 2 g by mouth daily.     . Garlic 6734 MG CAPS Take 1,000 mg by mouth daily.     . Ginkgo Biloba 40 MG TABS Take 40 mg by mouth daily.     Marland Kitchen glucose blood (ONETOUCH VERIO) test strip Use 1 strip daily to test blood sugars. Dx code 250.00 100 each 1  . Lacosamide (VIMPAT) 100 MG TABS Take 1 tablet (100 mg total) by mouth 2 (two) times daily. 180 tablet 1  . levETIRAcetam (KEPPRA) 500 MG tablet TAKE 2 TABLETS (1,000 MG TOTAL) BY MOUTH 2 (TWO) TIMES DAILY. 120 tablet 1  . lisinopril (PRINIVIL,ZESTRIL) 5 MG tablet Take 1 tablet (5 mg total) by mouth daily. 90 tablet 3  . loratadine (CLARITIN) 10 MG tablet Take 10 mg by mouth daily.     Marland Kitchen LORazepam (ATIVAN) 0.5 MG tablet Take 0.5 mg by mouth every evening.     . Lurasidone HCl 120 MG TABS Take 120 mg by mouth at bedtime.    . magnesium 30 MG tablet Take 30 mg by mouth 2 (two) times daily.     . Multiple Vitamins-Minerals (MULTIVITAMIN WITH MINERALS) tablet Take 1 tablet by mouth  daily.     . pantoprazole (PROTONIX) 40 MG tablet TAKE 1 TABLET (40 MG TOTAL) BY MOUTH DAILY. 30 tablet 10  . Riboflavin (VITAMIN B-2 PO) Take 100 mg by mouth 2 (two) times daily.    . SUMAtriptan (IMITREX) 100 MG tablet Take 100 mg by mouth every 2 (two) hours as needed.     . terbinafine (LAMISIL) 250 MG tablet Take 1 tablet (250 mg total) by mouth daily. 30 tablet 5  . venlafaxine (EFFEXOR) 75 MG tablet Take 150 mg by mouth 2 (two) times daily. ONE IN THE AM AND ONE IN PM     No current facility-administered  medications for this visit.    PHYSICAL EXAMINATION: ECOG PERFORMANCE STATUS: 1 - Symptomatic but completely ambulatory  Filed Vitals:   10/24/14 1356  BP: 135/95  Pulse: 98  Temp: 98.4 F (36.9 C)  Resp: 18   Filed Weights   10/24/14 1356  Weight: 188 lb 6.4 oz (85.458 kg)    GENERAL:alert, no distress and comfortable SKIN: skin color, texture, turgor are normal, no rashes or significant lesions EYES: normal, Conjunctiva are pink and non-injected, sclera clear OROPHARYNX:no exudate, no erythema and lips, buccal mucosa, and tongue normal  NECK: supple, thyroid normal size, non-tender, without nodularity LYMPH:  no palpable lymphadenopathy in the cervical, axillary or inguinal LUNGS: clear to auscultation and percussion with normal breathing effort HEART: regular rate & rhythm and no murmurs and no lower extremity edema ABDOMEN:abdomen soft, non-tender and normal bowel sounds Musculoskeletal:no cyanosis of digits and no clubbing  NEURO: alert & oriented x 3 with fluent speech, no focal motor/sensory deficits BREAST: No palpable masses or nodules in either right or left breasts. No palpable axillary supraclavicular or infraclavicular adenopathy no breast tenderness or nipple discharge. Left breast scar tissue appears to be well-healed no palpable lumps or nodules the left axilla   LABORATORY DATA:  I have reviewed the data as listed   Chemistry      Component Value Date/Time   NA 144 10/24/2014 1336   NA 143 06/10/2014 1512   K 3.5 10/24/2014 1336   K 3.5 06/10/2014 1512   CL 98 06/10/2014 1512   CL 103 10/22/2012 1343   CO2 31* 10/24/2014 1336   CO2 31 06/10/2014 1512   BUN 14.9 10/24/2014 1336   BUN 12 06/10/2014 1512   CREATININE 0.8 10/24/2014 1336   CREATININE 0.77 06/10/2014 1512   CREATININE 0.88 06/30/2013 0500      Component Value Date/Time   CALCIUM 10.7* 10/24/2014 1336   CALCIUM 10.1 06/10/2014 1512   ALKPHOS 138 10/24/2014 1336   ALKPHOS 133*  06/10/2014 1512   AST 21 10/24/2014 1336   AST 23 06/10/2014 1512   ALT 31 10/24/2014 1336   ALT 28 06/10/2014 1512   BILITOT 0.44 10/24/2014 1336   BILITOT 0.7 06/10/2014 1512       Lab Results  Component Value Date   WBC 10.5* 10/24/2014   HGB 13.2 10/24/2014   HCT 40.5 10/24/2014   MCV 80.7 10/24/2014   PLT 413* 10/24/2014   NEUTROABS 6.9* 10/24/2014   ASSESSMENT & PLAN:  Breast cancer of upper-outer quadrant of left female breast 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 Since she completed 5 years of therapy, I recommended that she can discontinue  Arimidex therapy at this point.  Surveillance: Today's breast exam did not reveal any abnormalities. Mammograms in April 2015 was normal. Survivorship: Encouraged patient to stay active physical exercise especially weightbearing exercises. Encouraged her to eat more fruits and vegetables and less red meat.  Return to clinic in May after undergoing mammograms for followup for physical exam.   No orders of the defined types were placed in this encounter.   The patient has a good understanding of the overall plan. she agrees with it. She will call with any problems that may develop before her next visit here.  I spent 20 minutes counseling the patient face to face. The total time spent in the appointment was 25 minutes and more than 50% was on counseling and review of test results    Rulon Eisenmenger, MD 10/24/2014 3:24 PM

## 2014-10-24 NOTE — Assessment & Plan Note (Signed)
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 Since she completed 5 years of therapy, I recommended that she can discontinue Arimidex therapy at this point.  Surveillance: Today's breast exam did not reveal any abnormalities. Mammograms in April 2015 was normal. Survivorship: Encouraged patient to stay active physical exercise especially weightbearing exercises. Encouraged her to eat more fruits and vegetables and less red meat.  Return to clinic in May after undergoing mammograms for followup for physical exam.

## 2014-11-02 ENCOUNTER — Encounter: Payer: Self-pay | Admitting: Neurology

## 2014-11-02 ENCOUNTER — Ambulatory Visit (INDEPENDENT_AMBULATORY_CARE_PROVIDER_SITE_OTHER): Payer: BC Managed Care – PPO | Admitting: Neurology

## 2014-11-02 VITALS — BP 151/90 | HR 106 | Ht 60.0 in | Wt 189.0 lb

## 2014-11-02 DIAGNOSIS — R569 Unspecified convulsions: Secondary | ICD-10-CM

## 2014-11-02 DIAGNOSIS — G43009 Migraine without aura, not intractable, without status migrainosus: Secondary | ICD-10-CM

## 2014-11-02 NOTE — Patient Instructions (Signed)

## 2014-11-02 NOTE — Progress Notes (Signed)
Reason for visit: seizures  Brandi [name] is an 58 y.o. female  History of present illness:  Brandi [name] is a 58 year old right-handed white female with a history of seizures. The patient has in the past had a very active EEG study with brief electroconvulsive seizures that are associated with eye blinking. The patient has had only one recent generalized convulsive seizure that occurred in July 2010. She is on Keppra and Vimpat, and she is tolerating these medications well. The patient finds that the Vimpat is difficult to afford, however. The patient is operating motor vehicle. She has had no new significant medical issues that have come up recently. The patient returns for an evaluation. She was last seen through this office around 06/05/2012.  Past Medical History  Diagnosis Date  . Dyslipidemia   . Depression     MAJOR  . GERD (gastroesophageal reflux disease)   . Migraines   . Allergic rhinitis   . Schizophrenic disorder   . Tonic-clonic seizure disorder X1  2010--  NO SEIZURE SINCE  . History of breast cancer DX 2010--  S/P LUMPECTOMY AND RADIATION -- NO RECURRENCE    ONCOLOGIST- DR Truddie Coco  . Anemia   . Left ureteral calculus   . Left flank pain   . Arthritis HX RIGHT ANKLE FX  . Type II or unspecified type diabetes mellitus without mention of complication, not stated as uncontrolled 03/16/2014  . Cancer     breast, lumpectomy    Past Surgical History  Procedure Laterality Date  . Child birth      C-SECTION  . Breast surgery  01-04-2009  DR [facility]    LEFT BREAST LUMPECTOMY W/ SLN DISSECTION  FOR CANCER  . Cesarean section  1998  . Laparoscopic cholecystectomy  1990'S  . Cystoscopy with retrograde pyelogram, ureteroscopy and stent placement  11/10/2012    Procedure: CYSTOSCOPY WITH RETROGRADE PYELOGRAM, URETEROSCOPY AND STENT PLACEMENT;  Surgeon: Hanley Ben, MD;  Location: [place];  Service: Urology;  Laterality: Left;  WITH DOUBLE J STENT    . Holmium laser application  83/15/1761    Procedure: HOLMIUM LASER APPLICATION;  Surgeon: Hanley Ben, MD;  Location: [place];  Service: Urology;  Laterality: Left;    Family History  Problem Relation Age of Onset  . Mental illness Maternal Uncle   . Cancer Maternal Grandmother   . Stroke Maternal Grandmother   . Tuberculosis Maternal Grandfather   . Cancer Paternal Grandmother   . Cancer Sister     breast  . Cancer Mother     breast  . Cancer Father     lung  . Seizures Neg Hx     Social history:  reports that she has never smoked. She has never used smokeless tobacco. She reports that she does not drink alcohol or use illicit drugs.    Allergies  Allergen Reactions  . Penicillins Rash    Medications:  Current Outpatient Prescriptions on File Prior to Visit  Medication Sig Dispense Refill  . anastrozole (ARIMIDEX) 1 MG tablet TAKE 1 TABLET BY MOUTH DAILY. 30 tablet 5  . Ascorbic Acid (VITAMIN C) 1000 MG tablet Take 2,000 mg by mouth daily.    Marland Kitchen atorvastatin (LIPITOR) 20 MG tablet TAKE 1 TABLET (20 MG TOTAL) BY MOUTH DAILY. 90 tablet 2  . calcium-vitamin D (OSCAL WITH D) 250-125 MG-UNIT per tablet Take 2 tablets by mouth daily. 30 tablet 11  . fish oil-omega-3 fatty acids 1000 MG capsule  Take 2 g by mouth daily.     . Garlic 1194 MG CAPS Take 1,000 mg by mouth daily.     . Ginkgo Biloba 40 MG TABS Take 40 mg by mouth daily.     Marland Kitchen glucose blood (ONETOUCH VERIO) test strip Use 1 strip daily to test blood sugars. Dx code 250.00 100 each 1  . Lacosamide (VIMPAT) 100 MG TABS Take 1 tablet (100 mg total) by mouth 2 (two) times daily. 180 tablet 1  . levETIRAcetam (KEPPRA) 500 MG tablet TAKE 2 TABLETS (1,000 MG TOTAL) BY MOUTH 2 (TWO) TIMES DAILY. 120 tablet 1  . lisinopril (PRINIVIL,ZESTRIL) 5 MG tablet Take 1 tablet (5 mg total) by mouth daily. 90 tablet 3  . loratadine (CLARITIN) 10 MG tablet Take 10 mg by mouth daily.     Marland Kitchen LORazepam (ATIVAN) 0.5 MG  tablet Take 0.5 mg by mouth every evening.     . Lurasidone HCl 120 MG TABS Take 120 mg by mouth at bedtime.    . magnesium 30 MG tablet Take 30 mg by mouth 2 (two) times daily.     . Multiple Vitamins-Minerals (MULTIVITAMIN WITH MINERALS) tablet Take 1 tablet by mouth daily.     . pantoprazole (PROTONIX) 40 MG tablet TAKE 1 TABLET (40 MG TOTAL) BY MOUTH DAILY. 30 tablet 10  . Riboflavin (VITAMIN B-2 PO) Take 100 mg by mouth 2 (two) times daily.    . SUMAtriptan (IMITREX) 100 MG tablet Take 100 mg by mouth every 2 (two) hours as needed.     . terbinafine (LAMISIL) 250 MG tablet Take 1 tablet (250 mg total) by mouth daily. 30 tablet 5  . venlafaxine (EFFEXOR) 75 MG tablet Take 150 mg by mouth 2 (two) times daily. ONE IN THE AM AND ONE IN PM     No current facility-administered medications on file prior to visit.    ROS:  Out of a complete 14 system review of symptoms, the patient complains only of the following symptoms, and all other reviewed systems are negative.  Excessive sweating Seizures  Blood pressure 151/90, pulse 106, height 5' (1.524 m), weight 189 lb (85.73 kg).  Physical Exam  General: The patient is alert and cooperative at the time of the examination. The patient is moderately to markedly obese.  Skin: No significant peripheral edema is noted.   Neurologic Exam  Mental status: The patient is oriented x 3.  Cranial nerves: Facial symmetry is present. Speech is normal, no aphasia or dysarthria is noted. Extraocular movements are full. Visual fields are full.  Motor: The patient has good strength in all 4 extremities.  Sensory examination: Soft touch sensation is symmetric on the face, arms, and legs.  Coordination: The patient has good finger-nose-finger and heel-to-shin bilaterally. An intention tremor seen with finger-nose-finger.  Gait and station: The patient has a normal gait. Tandem gait is slightly unsteady. Romberg is negative. No drift is  seen.  Reflexes: Deep tendon reflexes are symmetric.   Assessment/Plan:  1. History of seizures  The patient is on Keppra and Vimpat, we will continue the medications for now. The patient appears to be doing relatively well. She will follow-up in one year. I have given her a co-pay card for the Vimpat to help reduce the cost of the medication.  Jill Alexanders MD 11/02/2014 6:28 PM  [facility] [address] [place], [place] [address]  Phone [phone] Fax [phone]

## 2014-11-21 ENCOUNTER — Other Ambulatory Visit: Payer: Self-pay | Admitting: Family Medicine

## 2014-11-22 ENCOUNTER — Ambulatory Visit: Payer: BC Managed Care – PPO | Admitting: Family Medicine

## 2014-11-29 ENCOUNTER — Ambulatory Visit: Payer: BC Managed Care – PPO | Admitting: Family Medicine

## 2015-01-11 ENCOUNTER — Telehealth: Payer: Self-pay | Admitting: Family Medicine

## 2015-01-11 ENCOUNTER — Ambulatory Visit: Payer: BC Managed Care – PPO | Admitting: Family Medicine

## 2015-01-11 NOTE — Telephone Encounter (Signed)
Brandi [name] called in today and rescheduled her diabetes appointment for June 20, 2015. She is having issues with her insurance and has not been able to get them corrected. She has sent in an appeal but has not heard from them (this has been going on a couple of months) She will go on medicare in July and will be able to come in then.  She wanted to make sure you were aware

## 2015-01-11 NOTE — Telephone Encounter (Signed)
Pt called she says she knows she needs to come in to have her diabetes checked but she is having lots of difficulties with her insurance and her insurance is not paying claims.  She wanted me to let you know that her Medicare is effective 06/16/15.  I gave her to [facility] to discuss also.

## 2015-01-12 ENCOUNTER — Ambulatory Visit: Payer: BC Managed Care – PPO | Admitting: Family Medicine

## 2015-01-12 ENCOUNTER — Other Ambulatory Visit: Payer: Self-pay | Admitting: Neurology

## 2015-01-13 NOTE — Telephone Encounter (Signed)
Rx signed and faxed.

## 2015-03-08 ENCOUNTER — Other Ambulatory Visit: Payer: Self-pay | Admitting: Neurology

## 2015-03-08 MED ORDER — LACOSAMIDE 100 MG PO TABS: 1.0000 | ORAL_TABLET | Freq: Two times a day (BID) | ORAL | Status: DC

## 2015-03-08 MED ORDER — LEVETIRACETAM 500 MG PO TABS: ORAL_TABLET | ORAL | Status: DC

## 2015-03-08 NOTE — Telephone Encounter (Signed)
Patient's spouse requesting refill for Rx levETIRAcetam (KEPPRA) 500 MG tablet.  Please forward to [facility] [place], [place]. Please call and advise.

## 2015-04-07 NOTE — Discharge Summary (Signed)
PATIENT NAME:  Brandi [name], Brandi [name] MR#:  [id number] DATE OF BIRTH:  Nov 28, 1956  DATE OF ADMISSION:  06/20/2013 DATE OF DISCHARGE:  06/24/2013  ADMITTING DIAGNOSIS: Fever, confusion.   DISCHARGE DIAGNOSES: 1.  Fever, related to possible urinary tract infection with acute pyelonephritis as well as possible pneumonia status post infectious disease evaluation.  2.  Acute encephalopathy, felt to be due to infection, now resolved.  3.  Hypokalemia of unclear cause. The patient is currently discharged on potassium replacement. Will need to have a potassium repeated in primary MD's office. 4.  Depression.  5.  Tardive dyskinesia.  6.  History of seizure.  7.  History of breast cancer, status post cholecystectomy.   PERTINENT LABS AND EVALUATIONS: Admitting EKG showed sinus tachycardia. Blood cultures no growth. Troponin less than 0.02. Glucose 165, BUN 9, creatinine 0.69, sodium 137, potassium 2.9, chloride 98, CO2 28 and calcium 9.1. LFTs were normal, except alk phos of 203, AST 94, ALT 98 and total protein 7.6. WBC 16.9, hemoglobin 12.5 and platelet count 360. Chest x-ray showed mild right basilar opacities likely secondary to atelectasis. Subsequent chest x-ray, PA and lateral, the same day showed probable linear atelectasis in the right middle lobe, hyperinflation. The patient had urine culture with 40,000 CFUs of E. coli, pan-sensitive. Repeat blood cultures on 07/06 were negative. Urinalysis showed leukocytes 3+, bacteria 2+. The patient's CT scan of the chest and abdomen showed slight heterogenous enhancement pattern in the right kidney.  Correlate for underlying infections. Views of consolidation in the lower lung which could be secondary to atelectasis could be early pneumonia. Fatty infiltration of the liver. Cortisol level was 16.7. TSH 1.3.  Bilateral lower extremity Dopplers negative for DVT.  Most recent WBC count on 07/10 was 5.6.   HISTORY AND HOSPITAL COURSE:  Please refer to H and P done by  the admitting physician. The patient is a 59 year old white female with history of depression, seizure and tardive dyskinesia who lives with her husband who was brought into the ED with lethargy, confusion and fever. The patient also was noted to have heart rate in the 130s. She was thought to have early SIRS possibly due to urinary tract infection. The patient initially was treated with ceftriaxone; however, she continued to have fevers. Therefore, a CT of the abdomen and pelvis was done. CT of the abdomen and pelvis and chest showed a possible pneumonia as well as perinephritic inflammation suggestive of a urinary infection with pyelonephritis. Her urine cultures although only showed 40,000 CFUs of E. coli. Due to unclear source of her fevers, ID consult was obtained. The patient was initially on ceftriaxone, which was switched to Levaquin. With this she started becoming afebrile. She has been afebrile for the past 48 hours. Her WBC count is normalized. The patient also had a Doppler of her lower extremity, which was negative for DVT, due to elevated d-dimer. She does have an elevated sed rate likely related to her infection. This likely needs to be repeated as outpatient to make sure that she does not have any underlying other inflammatory processes going on. The patient also had hypokalemia on presentation. She had a slightly low magnesium which was replaced. Her potassium continues to be low. She is going to be discharged on potassium supplements. We will have to repeat a BMP coming week. Her cortisol check was normal. There is a possibility that she could have a problem with secretion of her potassium in her tubules. This will need to be evaluated if  she continues to have a problem with low potassium. At this time, she is stable for discharge.   DISCHARGE MEDICATIONS:  1.  Effexor-XR 300 mg daily.  2.  Imitrex 100 mg daily as needed. 3.  Keppra 1000 mg b.i.d. 4.  Lorazepam 0.5 mg 1 tab in the morning, 2  tabs in the evening. 5.  Protonix 40 mg daily. 6.  Atorvastatin 20 mg at bedtime. 7.  Anastrozole 1 mg daily. 8.  Latuda 120 mg daily. 9.  Vimpat 100 mg 1 tab p.o. b.i.d. 10.  Invega 1 tab p.o. daily. 11.  Iron sulfate 325 mg p.o. daily 12.  Acetaminophen/oxycodone 325/5 mg 1 to 2 tabs daily as needed. 13.  KCl 20 mEq 1 tablet p.o. b.i.d.  14.  Levofloxacin 500 mg p.o. daily x 4 more days. 15.  Mag oxide 400 mg 1 tab p.o. daily.  DISCHARGE DIET: Regular.   DISCHARGE ACTIVITY: As tolerated.   DISCHARGE FOLLOWUP AND INSTRUCTIONS: With primary MD next week. The patient to have a BMP check at primary MD's office for low potassium.   TIME SPENT ON DISCHARGE: 35 minutes. ____________________________ Lafonda Mosses Posey Pronto, MD shp:sb D: 06/25/2013 08:02:39 ET T: 06/25/2013 09:33:00 ET JOB#: [id number]  cc: Nehemie Casserly H. Posey Pronto, MD, <Dictator> Alric Seton MD ELECTRONICALLY SIGNED 06/29/2013 11:01

## 2015-04-07 NOTE — Consult Note (Signed)
PATIENT NAME:  Brandi [name], Brandi [name] MR#:  [id number] DATE OF BIRTH:  August 10, 1956  DATE OF CONSULTATION:  06/22/2013  REFERRING PHYSICIAN:  Dr. Posey Pronto CONSULTING PHYSICIAN:  Heinz Knuckles. Vanessa Alesi, MD  REASON FOR CONSULTATION: Fever.   HISTORY OF PRESENT ILLNESS: The patient is a 59 year old female with a past history significant for seizure disorder, tardive dyskinesia and breast cancer who was admitted on July 6 with confusion and fever. The patient states that she had been feeling poorly for a period of time, but could not really describe how long she had been ill. She was having fever up to 103 at home and began having increased lethargy and confusion and she was brought to the Emergency Room. She was found to be tachycardic and had mild leukocytosis and pyuria. She was started on ceftriaxone for presumed urinary tract infection. Her urine culture is only growing 40,000 CFU per milliliter of a gram-negative rod and despite ceftriaxone, she has continued to spike high fevers. Her blood cultures have been negative. Her white count has come down to normal. She denies any shortness of breath, but she has had some nonproductive cough. She denies any hemoptysis. She has not had any nausea, vomiting or change in her bowels. She denies any dysuria, hematuria or increased frequency. She has not had any rash or specific joint complaints, although she has been generally weak. She has had no abdominal or back pain.   ALLERGIES: PENICILLIN.   PAST MEDICAL HISTORY: 1.  Tardive dyskinesia.  2.  Seizure disorder. She has not had any recent seizures.  3.  Breast cancer.  4.  Depression   5.  Status post cholecystectomy.  6.  Status post appendectomy.   SOCIAL HISTORY: The patient lives with her husband and her daughter. She does not smoke. She does not drink. She does not have any history of injecting drug use. She has a dog at home.   FAMILY HISTORY: Positive for hypertension.   REVIEW OF SYSTEMS:   GENERAL:  Positive for fevers, chills, sweats, malaise and fatigue.  HEENT: No headaches. No sinus congestion. No sore throat.  NECK: No stiffness. No swollen glands.  RESPIRATORY: Positive cough, but no shortness of breath. No hemoptysis. No sputum production.  CARDIAC: No chest pains or palpitations. No peripheral edema.  GASTROINTESTINAL: No nausea, vomiting, abdominal pain, no change in her bowels.  GENITOURINARY: No increased frequency. No hematuria. No dysuria.  MUSCULOSKELETAL: She has had no focal joint complaints. No back pain. No flank pain.  SKIN: No rashes or ulcerations.  NEUROLOGIC: She has no focal weakness, but is globally weak. She has had no recent seizures.  PSYCHIATRIC: No current complaints.  All other systems are negative.   PHYSICAL EXAMINATION: VITAL SIGNS: T-max of 103.0, T-current of 102.3, pulse 123, blood pressure 155/86, 93% on room air.  GENERAL: A 59 year old obese white female in no acute distress, but skin was somewhat warm to touch.  HEENT: Normocephalic, atraumatic. Pupils equal, reactive to light. Extraocular motion intact. Sclerae, conjunctivae and lids are without evidence for emboli or petechiae. Oropharynx shows no erythema or exudate. Teeth and gums are in fair condition.  NECK: Supple. Full range of motion. Midline trachea. No lymphadenopathy. No thyromegaly.  LUNGS: Clear to auscultation bilaterally with good air movement. No focal consolidation. She was not tachypneic.  CARDIAC: Tachycardic, regular rhythm without murmur, rub or gallop.  ABDOMEN: Soft, nontender and nondistended. No hepatosplenomegaly. No hernias noted. No CVA tenderness.  EXTREMITIES: No evidence for tenosynovitis. No edema.  SKIN:  No rashes. No stigmata of endocarditis, specifically, no Janeway lesions or Osler nodes.  NEUROLOGIC: The patient is awake and interactive. She had some difficulty with forming words for speech, but was able to provide a fairly significant history.  PSYCHIATRIC:  Mood and affect appeared normal.   LABORATORY, DIAGNOSTIC AND RADIOLOGIC DATA: BUN 5, creatinine 0.59, bicarbonate 26, anion gap of 8, AST 98, ALT 94, alkaline phosphatase 203, total bilirubin 0.9. White count is 10.8 with a hemoglobin 10.8, platelet count of 280, ANC 9.1. Sedimentation rate of 104. White count on admission was 16.9. Blood cultures from admission show no growth. Urinalysis had 3+ blood, 30 mg/dL of protein, negative nitrites, 3+ leukocyte esterase, 16 red cells and 127 white cells per high-powered field. Urine culture is growing 40,000 CFU per milliliter of a gram-negative rod. D-dimer was 1.34.   Chest x-ray from admission showed mild right basilar opacities likely secondary to atelectasis. A PA and lateral chest x-ray showed probable linear atelectasis in the right middle lobe and hypoinflation.   IMPRESSION: A 59 year old female with history of seizure disorder, tardive dyskinesia and breast cancer, who was admitted with fever and confusion.   RECOMMENDATIONS: 1. Unclear source for her fevers. She had some pyuria on admission, but nitrites were negative. Her urine culture is only growing a minimal amount of a gram-negative rod. It is possible that she has a ureteral obstruction and has purulence behind the obstruction. This would explain perhaps her culture results. She has no CVA tenderness, however. She does not have significant respiratory symptoms and her chest x-ray does not show obvious pneumonia. She has no focal pain. Her abdomen examination still fairly benign. Her LFTs are mildly elevated, however. She denies any change in her bowels. Blood cultures are negative so far.  2.  Her white count was up on admission, has come down with antibiotics. She continues to spike fevers and feel poorly in general, however.  3. Her with antibiotics were changed from ceftriaxone to Levaquin. I will continue this for now.  4.  I agree with the CT scan of the chest, abdomen and pelvis.  5.   We will check a TSH and cortisol.  6.  We will repeat her LFTs, if these are rising then will send hepatitis serologies. Deep vein thrombosis or pulmonary embolus could cause fever. If her CT is negative, would consider venous Doppler.   This is a moderately complex infectious disease case. Thank you very much for involving me in Ms. Deloney's  care. Lisabeth Devoid   ____________________________ Heinz Knuckles. Sandia Pfund, MD meb:cc D: 06/22/2013 15:47:47 ET T: 06/22/2013 16:54:37 ET JOB#: [id number]  cc: Heinz Knuckles. Abubakr Wieman, MD, <Dictator> Kayl Stogdill E Quanah Majka MD ELECTRONICALLY SIGNED 06/23/2013 14:16

## 2015-04-07 NOTE — Consult Note (Signed)
Impression:    59yo female w/ h/o seizure disorder, tardive dyskinesia and breast CA admitted with fever and confusion.   Unclear source for her fevers.  She had some pyuria on admission, but her urine culture is growing a minimal amount of GNR.  It is possible that she has a ureteral obstruction and has purulence behind the obstruction.  She has no CVA tenderness. She does not have significant respiratory symptoms and her CXR does not show obvious pneumonia.  She has no focal pain.  Her abd exam is fairly benign.  Her LFTs are mildly elevated.  She denies any change in her bowels.  Her BCx are negative so far.  Her WBC was up on admission, but has come down with antibiotics.   She continues to spike fevers and feel poorly in general. Her antibiotics were changed from ceftriaxone to levofloxacin.  Would continue this for now. Agree with CT of chest, abd and pelvis.  Will check TSH and cortisol. Repeat LFTs.  If rising, will send hepatitis serologies. 7)  DVT or PE could cause fever.  If her CT is negative.  Would consider venous dopplers.   Electronic Signatures: Swade Shonka MPH, Heinz Knuckles (MD) (Signed on 08-Jul-14 15:38)  Authored   Last Updated: 08-Jul-14 15:47 by Keeven Matty MPH, Heinz Knuckles (MD)

## 2015-04-07 NOTE — H&P (Signed)
PATIENT NAME:  Brandi [name], Brandi [name] MR#:  [id number] DATE OF BIRTH:  1955-12-31  DATE OF ADMISSION:  06/20/2013  PRIMARY CARE PHYSICIAN:  Jill Alexanders, MD  CHIEF COMPLAINT: Confusion and fever.   HISTORY OF PRESENT ILLNESS: A 59 year old female patient with history of depression, seizures, tardive dyskinesia, who lives with her husband, has had decreased intake, lethargy and confusion since yesterday. She had fever yesterday, but today on worsening, her husband brought her to the Emergency Room. Here, she has fever of 103.1. Has a urinary tract infection. The patient seems to be slowly improving from the encephalopathy. She was extremely tachycardic in the 130s. She is being admitted to the hospitalist service with sepsis, urinary tract infection, acute encephalopathy.   The patient does not complain of any abdominal pain or burning with urination. She still seems to have disorientation to time. She is oriented to person and place.   She has not used any recent antibiotics. Does not complain of any focal neurological deficits.   PAST MEDICAL HISTORY: 1.  Depression.  2.  Tardive dyskinesia.  3.  Seizures.  4.  Breast cancer.  PAST SURGICAL HISTORY: Cholecystectomy.   SOCIAL HISTORY: The patient lives with her husband. Does not smoke. No alcohol. No illicit drugs.   CODE STATUS: Full code.   FAMILY HISTORY: Hypertension.   ALLERGIES: PENICILLIN WHICH CAUSES RASH.   REVIEW OF SYSTEMS: CONSTITUTIONAL: Complains of fatigue. Had fever. No weight loss, weight gain.  EYES: No blurred vision, pain or redness.  ENT: No tinnitus, ear pain, hearing loss.  RESPIRATORY: No shortness of breath, cough, hemoptysis.  CARDIOVASCULAR: No chest pain, orthopnea, edema.  GASTROINTESTINAL: No nausea, vomiting, diarrhea, abdominal pain.  GENITOURINARY: Has a urinary tract infection.  MUSCULOSKELETAL: No joint swelling or redness. No arthritis, back pain.  SKIN: No rash, ulcers.  NEUROLOGIC: No focal  numbness, weakness, has history of seizures.  PSYCHIATRIC:  Has anxiety, depression.   HOME MEDICATIONS: 1.  Anastrozole 1 mg oral once a day.  2.  Atorvastatin 20 mg oral once a day.  3.  Effexor 150 mg 2 capsules oral once a day.  4.  Imitrex 100 mg oral once a day as needed for headache.  5.  Invega 1 tablet oral once a 6 mg.  6.  Keppra 500 mg 2 tablets oral 2 times a day.  7.  Latuda 120 mg oral once a day.  8.  Clonazepam 0.5 mg oral twice a day.  9.  Protonix 40 mg oral once a day.  10.  Vimpat 100 mg oral 2 times a day.   PHYSICAL EXAMINATION: VITAL SIGNS: Temperature 103, pulse of 132, blood pressure 163/90, saturating 99% on 1 liter oxygen.  GENERAL: Obese Caucasian female patient, lying in bed, comfortable, conversational. Does have symptoms tardive dyskinesia,  with which is on her face.  PSYCHIATRIC: Alert, awake, oriented to place and person, not to time, pleasant.  HEENT: Atraumatic, normocephalic. Oral mucosa moist and pink. External ears and nose normal. No pallor or icterus. Pupils bilaterally equal and react to light.  NECK: Supple. No thyromegaly. No palpable lymph nodes. Trachea midline. No carotid bruit, JVD.  CARDIOVASCULAR: S1, S2, tachycardic without any murmurs. Peripheral pulses 2+. No edema.  RESPIRATORY: Normal work of breathing. Clear to auscultation on both sides.   GASTROINTESTINAL: Soft abdomen, nontender. Bowel sounds present. No organomegaly palpable.  SKIN: Warm and dry. No petechiae, rash, ulcers.  MUSCULOSKELETAL: No joint swelling, redness, effusion of the large joints. Normal muscle tone.  NEUROLOGICAL:  Motor strength 5/5 in upper and lower extremities. Sensation is intact all over.  LYMPHATIC: No cervical, supraclavicular lymphadenopathy.  GENITOURINARY: No CVA tenderness or bladder distention.   LABORATORY, DIAGNOSTIC AND RADIOLOGICAL DATA: Urinalysis with 3+ bacteria, 127 WBC, glucose 165, BUN 9, creatinine 0.69, potassium of 2.9, alkaline  phosphatase 203. AST 98, ALT 94. Troponin less than 0.02. WBC 6.9, hemoglobin 12.5, platelets of 360.   EKG shows normal sinus rhythm. No acute ST-T wave changes.   Chest x-ray shows mild right basilar opacity, likely atelectasis.   ASSESSMENT AND PLAN: 1.  Acute encephalopathy secondary to urinary tract infection with sepsis. We will start the patient on ceftriaxone, intravenous fluids. Her encephalopathy seems to be slowly improving. The patient does have elevated white count, tachycardia, fever of 103 with sepsis. We will wait for urine and blood cultures.  2.  Elevated AST, ALT, we will hold the statin the patient is on.  3.  Hypokalemia. Replace oral and IV.  4.  Depression. Continue medications.  5.  Deep vein thrombosis prophylaxis Lovenox.   CODE STATUS: Full code.   TIME SPENT TODAY ON THIS CASE: 48 minutes    ____________________________ Leia Alf. Brianah Hopson, MD srs:cc D: 06/20/2013 47:09:62 ET T: 06/20/2013 22:03:05 ET JOB#: [id number]  cc: Alveta Heimlich R. Torie Priebe, MD, <Dictator> Neita Carp MD ELECTRONICALLY SIGNED 07/04/2013 23:38

## 2015-04-17 LAB — HM MAMMOGRAPHY

## 2015-04-18 ENCOUNTER — Encounter: Payer: Self-pay | Admitting: Internal Medicine

## 2015-05-01 ENCOUNTER — Other Ambulatory Visit: Payer: Self-pay

## 2015-05-01 DIAGNOSIS — C50412 Malignant neoplasm of upper-outer quadrant of left female breast: Secondary | ICD-10-CM

## 2015-05-01 NOTE — Assessment & Plan Note (Signed)
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 Breast exam 05/02/15: Normal 2. Mammogram 04/17/15 No abnormalities. Postsurgical changes. Breast Density Category B. I recommended that she get 3-D mammograms for surveillance. Discussed the differences between different breast density categories.  RTC in 1 year with survivorship clinic

## 2015-05-02 ENCOUNTER — Other Ambulatory Visit: Payer: BC Managed Care – PPO

## 2015-05-02 ENCOUNTER — Ambulatory Visit: Payer: BC Managed Care – PPO | Admitting: Hematology and Oncology

## 2015-05-10 ENCOUNTER — Telehealth: Payer: Self-pay | Admitting: Family Medicine

## 2015-05-12 NOTE — Telephone Encounter (Signed)
P.A. Approved til 05/09/16, faxed pharmacy, left message for pt

## 2015-05-13 ENCOUNTER — Other Ambulatory Visit: Payer: Self-pay | Admitting: Family Medicine

## 2015-05-17 ENCOUNTER — Encounter: Payer: Self-pay | Admitting: Family Medicine

## 2015-05-17 ENCOUNTER — Ambulatory Visit (INDEPENDENT_AMBULATORY_CARE_PROVIDER_SITE_OTHER): Payer: BC Managed Care – PPO | Admitting: Family Medicine

## 2015-05-17 VITALS — BP 130/90 | HR 123 | Temp 100.3°F | Wt 189.2 lb

## 2015-05-17 DIAGNOSIS — N390 Urinary tract infection, site not specified: Secondary | ICD-10-CM | POA: Diagnosis not present

## 2015-05-17 LAB — POCT URINALYSIS DIPSTICK
Bilirubin, UA: NEGATIVE
Glucose, UA: NEGATIVE
Ketones, UA: 0.5
NITRITE UA: NEGATIVE
PH UA: 6
PROTEIN UA: 0.15
RBC UA: NEGATIVE
Spec Grav, UA: 1.015
UROBILINOGEN UA: NEGATIVE

## 2015-05-17 NOTE — Progress Notes (Signed)
   Subjective:    Patient ID: Brandi [name], female    DOB: 03-11-1956, 59 y.o.   MRN: [id number]  HPI She is here for follow-up visit. Approximately one week ago she had difficulty with vomiting but did start to feel better proximally 3 days ago she had difficulty with abdominal pain, malaise, anorexia with fever. She was seen in an urgent care center and placed on Cipro. Urinalysis was attempted but not done. There is essentially treating her for UTI. She states she is not much better although her husband says she is. She does complain of being thirsty but no dysuria, polyuria, urgency.   Review of Systems     Objective:   Physical Exam Alert and in no distress But quite pale appearing. Tympanic membranes and canals are normal. Pharyngeal area is normal. Neck is supple without adenopathy or thyromegaly. Cardiac exam shows a regular sinus rhythm without murmurs or gallops. Lungs are clear to auscultation. Urine microscopic was positive for white cells and bacteria and occasional red cells.       Assessment & Plan:  Acute UTI - Plan: POCT Urinalysis Dipstick, CULTURE, URINE COMPREHENSIVE, CANCELED: CULTURE, URINE COMPREHENSIVE They are to call me tomorrow to let me know how she is doing. Explained that it might just require more time before she gets better. If she doesn't improve I will then give her Septra and wait on the culture results.

## 2015-05-17 NOTE — Patient Instructions (Signed)
Call in the morning and let me know how she is doing. If she is not any better I will switch her to trip.

## 2015-05-19 ENCOUNTER — Ambulatory Visit: Payer: BC Managed Care – PPO | Admitting: Family Medicine

## 2015-05-19 LAB — CULTURE, URINE COMPREHENSIVE
Colony Count: NO GROWTH
ORGANISM ID, BACTERIA: NO GROWTH

## 2015-06-20 ENCOUNTER — Ambulatory Visit: Payer: BC Managed Care – PPO | Admitting: Family Medicine

## 2015-06-25 ENCOUNTER — Other Ambulatory Visit: Payer: Self-pay | Admitting: Family Medicine

## 2015-06-26 DIAGNOSIS — F323 Major depressive disorder, single episode, severe with psychotic features: Secondary | ICD-10-CM | POA: Diagnosis not present

## 2015-06-27 ENCOUNTER — Ambulatory Visit (INDEPENDENT_AMBULATORY_CARE_PROVIDER_SITE_OTHER): Payer: Medicare Other | Admitting: Family Medicine

## 2015-06-27 ENCOUNTER — Other Ambulatory Visit: Payer: Self-pay

## 2015-06-27 ENCOUNTER — Encounter: Payer: Self-pay | Admitting: Family Medicine

## 2015-06-27 VITALS — BP 122/80 | HR 97 | Wt 189.0 lb

## 2015-06-27 DIAGNOSIS — E785 Hyperlipidemia, unspecified: Secondary | ICD-10-CM | POA: Diagnosis not present

## 2015-06-27 DIAGNOSIS — E669 Obesity, unspecified: Secondary | ICD-10-CM

## 2015-06-27 DIAGNOSIS — E119 Type 2 diabetes mellitus without complications: Secondary | ICD-10-CM

## 2015-06-27 DIAGNOSIS — F320 Major depressive disorder, single episode, mild: Secondary | ICD-10-CM | POA: Diagnosis not present

## 2015-06-27 DIAGNOSIS — E1169 Type 2 diabetes mellitus with other specified complication: Secondary | ICD-10-CM | POA: Diagnosis not present

## 2015-06-27 DIAGNOSIS — E1159 Type 2 diabetes mellitus with other circulatory complications: Secondary | ICD-10-CM

## 2015-06-27 DIAGNOSIS — I10 Essential (primary) hypertension: Secondary | ICD-10-CM

## 2015-06-27 MED ORDER — GLUCOSE BLOOD VI STRP: ORAL_STRIP | Status: DC

## 2015-06-27 NOTE — Progress Notes (Signed)
  Subjective:    Patient ID: Brandi [name], female    DOB: 08-11-1956, 59 y.o.   MRN: [id number]  Brandi [name] is a 59 y.o. female who presents for follow-up of Type 2 diabetes mellitus.  Home blood sugar records: Patient test one time a day Current symptoms/problems include None Daily foot checks:Yes   Any foot concerns: toenails Exercise: bike but unfortunately rarely  eyes: 11/2014 She continues to be followed by her psychiatrist and she seems to be stable psychologically. The following portions of the patient's history were reviewed and updated as appropriate: allergies, current medications, past medical history, past social history and problem list.  ROS as in subjective above.     Objective:    Physical Exam Alert and in no distress otherwise not examined.  Blood pressure 122/80, pulse 97, weight 189 lb (85.73 kg), SpO2 97 %.  Lab Review Diabetic Labs Latest Ref Rng 10/24/2014 09/19/2014 07/18/2014 06/10/2014 03/16/2014  HbA1c - - - 6.5 - 6.5  Chol 0 - 200 mg/dL - 187 - - -  HDL >39 mg/dL - 74 - - -  Calc LDL 0 - 99 mg/dL - 67 - - -  Triglycerides <150 mg/dL - 232(H) - - -  Creatinine 0.6 - 1.1 mg/dL 0.8 - - 0.77 -   BP/Weight 06/27/2015 05/17/2015 11/02/2014 10/24/2014 79/03/8015  Systolic BP 553 748 270 786 754  Diastolic BP 80 90 90 95 82  Wt. (Lbs) 189 189.2 189 188.4 178  BMI 36.91 36.95 36.91 37.43 35.36   Foot/eye exam completion dates Latest Ref Rng 09/26/2014  Eye Exam No Retinopathy No Retinopathy  Foot Form Completion - -    Blaise  reports that she has never smoked. She has never used smokeless tobacco. She reports that she does not drink alcohol or use illicit drugs.     Assessment & Plan:    Diabetes mellitus without complication  Hyperlipidemia associated with type 2 diabetes mellitus  Obesity (BMI 30-39.9)  Hypertension associated with diabetes  Major depressive disorder, single episode, mild   Rx changes: none  Education: Reviewed 'ABCs'  of diabetes management (respective goals in parentheses):  A1C (<7), blood pressure (<130/80), and cholesterol (LDL <100).  Compliance at present is estimated to be good. Efforts to improve compliance (if necessary) will be directed at increased exercise.  Follow up: 4 months

## 2015-06-27 NOTE — Progress Notes (Deleted)
Subjective:    Patient ID: Brandi [name], female    DOB: 11/20/1956, 59 y.o.   MRN: [id number]  Brandi [name] is a 59 y.o. female who presents for follow-up of Type 2 diabetes mellitus.  Home blood sugar records: patient test one time a day Current symptoms/problems none Daily foot checks:yes   Any foot concerns: *** right foot toenail  Exercise: exercise bike Eye: 09/26/2014 The following portions of the patient's history were reviewed and updated as appropriate: allergies, current medications, past medical history, past social history and problem list.  ROS as in subjective above.     Objective:    Physical Exam Alert and in no distress otherwise not examined.  There were no vitals taken for this visit.  Lab Review Diabetic Labs Latest Ref Rng 10/24/2014 09/19/2014 07/18/2014 06/10/2014 03/16/2014  HbA1c - - - 6.5 - 6.5  Chol 0 - 200 mg/dL - 187 - - -  HDL >39 mg/dL - 74 - - -  Calc LDL 0 - 99 mg/dL - 67 - - -  Triglycerides <150 mg/dL - 232(H) - - -  Creatinine 0.6 - 1.1 mg/dL 0.8 - - 0.77 -   BP/Weight 05/17/2015 11/02/2014 10/24/2014 45/05/2562 07/24/3733  Systolic BP 287 681 157 262 035  Diastolic BP 90 90 95 82 82  Wt. (Lbs) 189.2 189 188.4 178 180  BMI 36.95 36.91 37.43 35.36 35.15   Foot/eye exam completion dates Latest Ref Rng 09/26/2014  Eye Exam No Retinopathy No Retinopathy  Foot Form Completion - -    Brandi [name]  reports that she has never smoked. She has never used smokeless tobacco. She reports that she does not drink alcohol or use illicit drugs.     Assessment & Plan:    No diagnosis found.  1. Rx changes: {none:33079} 2. Education: Reviewed 'ABCs' of diabetes management (respective goals in parentheses):  A1C (<7), blood pressure (<130/80), and cholesterol (LDL <100). 3. Compliance at present is estimated to be {good/fair/poor:33178}. Efforts to improve compliance (if necessary) will be directed at {compliance:16716}. 4. Follow up: {NUMBERS; 5-97:41638}  {time:11}        Subjective:    Patient ID: Brandi [name], female    DOB: 1956-09-24, 59 y.o.   MRN: [id number]  Brandi [name] is a 59 y.o. female who presents for follow-up of Type 2 diabetes mellitus.  Home blood sugar records: {diabetes glucometry results:16657} Current symptoms/problems include {Symptoms; diabetes:14075} and have been {Desc; course:15616}. Daily foot checks:   Any foot concerns: *** Exercise: {types:19826}  The following portions of the patient's history were reviewed and updated as appropriate: allergies, current medications, past medical history, past social history and problem list.  ROS as in subjective above.     Objective:    Physical Exam Alert and in no distress otherwise not examined.  There were no vitals taken for this visit.  Lab Review Diabetic Labs Latest Ref Rng 10/24/2014 09/19/2014 07/18/2014 06/10/2014 03/16/2014  HbA1c - - - 6.5 - 6.5  Chol 0 - 200 mg/dL - 187 - - -  HDL >39 mg/dL - 74 - - -  Calc LDL 0 - 99 mg/dL - 67 - - -  Triglycerides <150 mg/dL - 232(H) - - -  Creatinine 0.6 - 1.1 mg/dL 0.8 - - 0.77 -   BP/Weight 05/17/2015 11/02/2014 10/24/2014 21/01/2481 5/0/0370  Systolic BP 488 891 694 503 888  Diastolic BP 90 90 95 82 82  Wt. (Lbs) 189.2 189 188.4 178 180  BMI  36.95 36.91 37.43 35.36 35.15   Foot/eye exam completion dates Latest Ref Rng 09/26/2014  Eye Exam No Retinopathy No Retinopathy  Foot Form Completion - -    Brandi [name]  reports that she has never smoked. She has never used smokeless tobacco. She reports that she does not drink alcohol or use illicit drugs.     Assessment & Plan:    No diagnosis found.  5. Rx changes: {none:33079} 6. Education: Reviewed 'ABCs' of diabetes management (respective goals in parentheses):  A1C (<7), blood pressure (<130/80), and cholesterol (LDL <100). 7. Compliance at present is estimated to be {good/fair/poor:33178}. Efforts to improve compliance (if necessary) will be directed at  {compliance:16716}. 8. Follow up: {NUMBERS; 6-75:44920} {time:11}        Subjective:    Patient ID: Brandi [name], female    DOB: 11-02-1956, 59 y.o.   MRN: [id number]  Brandi [name] is a 59 y.o. female who presents for follow-up of Type 2 diabetes mellitus.  Home blood sugar records: {diabetes glucometry results:16657} Current symptoms/problems include {Symptoms; diabetes:14075} and have been {Desc; course:15616}. Daily foot checks:   Any foot concerns: *** Exercise: {types:19826}  The following portions of the patient's history were reviewed and updated as appropriate: allergies, current medications, past medical history, past social history and problem list.  ROS as in subjective above.     Objective:    Physical Exam Alert and in no distress otherwise not examined.  There were no vitals taken for this visit.  Lab Review Diabetic Labs Latest Ref Rng 10/24/2014 09/19/2014 07/18/2014 06/10/2014 03/16/2014  HbA1c - - - 6.5 - 6.5  Chol 0 - 200 mg/dL - 187 - - -  HDL >39 mg/dL - 74 - - -  Calc LDL 0 - 99 mg/dL - 67 - - -  Triglycerides <150 mg/dL - 232(H) - - -  Creatinine 0.6 - 1.1 mg/dL 0.8 - - 0.77 -   BP/Weight 05/17/2015 11/02/2014 10/24/2014 58/07/3253 08/23/2640  Systolic BP 583 094 076 808 811  Diastolic BP 90 90 95 82 82  Wt. (Lbs) 189.2 189 188.4 178 180  BMI 36.95 36.91 37.43 35.36 35.15   Foot/eye exam completion dates Latest Ref Rng 09/26/2014  Eye Exam No Retinopathy No Retinopathy  Foot Form Completion - -    Brandi [name]  reports that she has never smoked. She has never used smokeless tobacco. She reports that she does not drink alcohol or use illicit drugs.     Assessment & Plan:    No diagnosis found.  9. Rx changes: {none:33079} 10. Education: Reviewed 'ABCs' of diabetes management (respective goals in parentheses):  A1C (<7), blood pressure (<130/80), and cholesterol (LDL <100). 11. Compliance at present is estimated to be {good/fair/poor:33178}.  Efforts to improve compliance (if necessary) will be directed at {compliance:16716}. 12. Follow up: {NUMBERS; 0-10:33138} {time:11}

## 2015-06-28 LAB — HEMOGLOBIN A1C
Hgb A1c MFr Bld: 6.8 % — ABNORMAL HIGH (ref <5.7)
Mean Plasma Glucose: 148 mg/dL — ABNORMAL HIGH (ref <117)

## 2015-07-05 ENCOUNTER — Other Ambulatory Visit: Payer: Self-pay | Admitting: Family Medicine

## 2015-07-19 DIAGNOSIS — F323 Major depressive disorder, single episode, severe with psychotic features: Secondary | ICD-10-CM | POA: Diagnosis not present

## 2015-08-03 ENCOUNTER — Ambulatory Visit (INDEPENDENT_AMBULATORY_CARE_PROVIDER_SITE_OTHER): Payer: Medicare Other | Admitting: Family Medicine

## 2015-08-03 ENCOUNTER — Encounter: Payer: Self-pay | Admitting: Family Medicine

## 2015-08-03 VITALS — BP 120/78 | HR 96 | Wt 184.0 lb

## 2015-08-03 DIAGNOSIS — H8303 Labyrinthitis, bilateral: Secondary | ICD-10-CM

## 2015-08-03 MED ORDER — MECLIZINE HCL 12.5 MG PO TABS: 12.5000 mg | ORAL_TABLET | Freq: Two times a day (BID) | ORAL | Status: DC | PRN

## 2015-08-03 NOTE — Patient Instructions (Signed)
Take the meclizine as needed for the dizziness and if you continue to have trouble giving me a call

## 2015-08-03 NOTE — Progress Notes (Signed)
   Subjective:    Patient ID: Brandi [name], female    DOB: Nov 22, 1956, 59 y.o.   MRN: [id number]  HPI  She is here for evaluation of dizziness. She notes viral upper respiratory infection symptoms approximately one month ago. All these have resolved. Approximately 3 days ago she had the onset of dizziness but no blurred vision, double vision, nausea, vomiting or weakness. The dizziness can occur in any position.   Review of Systems     Objective:   Physical Exam Alert and in no distress. EOMI. Tympanic membranes normal. Throat is clear. Neck is supple without adenopathy. Cardiac exam shows regular rhythm without murmurs or gallops. Lungs are clear to auscultation.       Assessment & Plan:  Labyrinthitis, acute viral, bilateral - Plan: meclizine (ANTIVERT) 12.5 MG tablet Her symptoms are most consistent with a post viral labyrinthitis. Will treat with meclizine. She will call if further difficulty.

## 2015-08-23 DIAGNOSIS — F323 Major depressive disorder, single episode, severe with psychotic features: Secondary | ICD-10-CM | POA: Diagnosis not present

## 2015-09-15 DIAGNOSIS — Z23 Encounter for immunization: Secondary | ICD-10-CM | POA: Diagnosis not present

## 2015-09-21 ENCOUNTER — Encounter: Payer: Self-pay | Admitting: Family Medicine

## 2015-09-21 ENCOUNTER — Ambulatory Visit (INDEPENDENT_AMBULATORY_CARE_PROVIDER_SITE_OTHER): Payer: Medicare Other | Admitting: Family Medicine

## 2015-09-21 VITALS — BP 120/70 | HR 92 | Ht 61.0 in | Wt 192.0 lb

## 2015-09-21 DIAGNOSIS — E669 Obesity, unspecified: Secondary | ICD-10-CM

## 2015-09-21 DIAGNOSIS — E785 Hyperlipidemia, unspecified: Secondary | ICD-10-CM | POA: Diagnosis not present

## 2015-09-21 DIAGNOSIS — E1169 Type 2 diabetes mellitus with other specified complication: Secondary | ICD-10-CM

## 2015-09-21 DIAGNOSIS — E119 Type 2 diabetes mellitus without complications: Secondary | ICD-10-CM | POA: Diagnosis not present

## 2015-09-21 DIAGNOSIS — F323 Major depressive disorder, single episode, severe with psychotic features: Secondary | ICD-10-CM

## 2015-09-21 DIAGNOSIS — C50412 Malignant neoplasm of upper-outer quadrant of left female breast: Secondary | ICD-10-CM | POA: Diagnosis not present

## 2015-09-21 DIAGNOSIS — G43009 Migraine without aura, not intractable, without status migrainosus: Secondary | ICD-10-CM | POA: Diagnosis not present

## 2015-09-21 DIAGNOSIS — J301 Allergic rhinitis due to pollen: Secondary | ICD-10-CM

## 2015-09-21 DIAGNOSIS — K219 Gastro-esophageal reflux disease without esophagitis: Secondary | ICD-10-CM

## 2015-09-21 DIAGNOSIS — R569 Unspecified convulsions: Secondary | ICD-10-CM

## 2015-09-21 NOTE — Patient Instructions (Addendum)
  Brandi [name] , Thank you for taking time to come for your Medicare Wellness Visit. I appreciate your ongoing commitment to your health goals. Please review the following plan we discussed and let me know if I can assist you in the future.   These are the goals we discussed: 20 minutes of exercise every day. Keep your follow-up appointment with the eye doctor. Continue to check your feet regularly. Follow-up with your neurologist as well as psychiatrist and oncologistget your tetanus shot. 20 minutes of something physical every day. Need to come back here every 4 months keep checking your blood sugars. See your eye doctor. Keep a check on your feet visually weekly    This is a list of the screening recommended for you and due dates: Your screening seems to be up-to-date. Follow-up here on a four-month basis.

## 2015-09-21 NOTE — Progress Notes (Signed)
   Subjective:    Patient ID: Brandi [name], female    DOB: 1956-11-13, 59 y.o.   MRN: [id number]  HPI She is here for the IPPE. Her medical and social history was reviewed. Visual acuity was obtained. Her BMI is slightly over 36. She does have a history of depression and continues to see her psychiatrist for this. He is stable with this. She has had no falls. She does not keep herself very physically active.   Review of Systems     Objective:   Physical Exam Alert and in no distress otherwise not examined.       Assessment & Plan:

## 2015-09-25 ENCOUNTER — Encounter: Payer: BC Managed Care – PPO | Admitting: Family Medicine

## 2015-09-25 ENCOUNTER — Other Ambulatory Visit: Payer: Self-pay | Admitting: Family Medicine

## 2015-09-27 ENCOUNTER — Other Ambulatory Visit: Payer: Self-pay | Admitting: Neurology

## 2015-09-28 NOTE — Telephone Encounter (Signed)
Rx signed and faxed.

## 2015-10-02 DIAGNOSIS — E119 Type 2 diabetes mellitus without complications: Secondary | ICD-10-CM | POA: Diagnosis not present

## 2015-10-02 DIAGNOSIS — H5213 Myopia, bilateral: Secondary | ICD-10-CM | POA: Diagnosis not present

## 2015-10-02 DIAGNOSIS — H25813 Combined forms of age-related cataract, bilateral: Secondary | ICD-10-CM | POA: Diagnosis not present

## 2015-10-02 LAB — HM DIABETES EYE EXAM

## 2015-10-03 ENCOUNTER — Encounter: Payer: Self-pay | Admitting: Family Medicine

## 2015-10-19 DIAGNOSIS — H2512 Age-related nuclear cataract, left eye: Secondary | ICD-10-CM | POA: Diagnosis not present

## 2015-10-19 DIAGNOSIS — H25812 Combined forms of age-related cataract, left eye: Secondary | ICD-10-CM | POA: Diagnosis not present

## 2015-10-19 DIAGNOSIS — H25042 Posterior subcapsular polar age-related cataract, left eye: Secondary | ICD-10-CM | POA: Diagnosis not present

## 2015-11-02 ENCOUNTER — Encounter: Payer: Self-pay | Admitting: Adult Health

## 2015-11-02 ENCOUNTER — Ambulatory Visit (INDEPENDENT_AMBULATORY_CARE_PROVIDER_SITE_OTHER): Payer: Medicare Other | Admitting: Adult Health

## 2015-11-02 ENCOUNTER — Encounter (INDEPENDENT_AMBULATORY_CARE_PROVIDER_SITE_OTHER): Payer: Self-pay

## 2015-11-02 VITALS — BP 143/88 | HR 95 | Ht 61.0 in | Wt 195.0 lb

## 2015-11-02 DIAGNOSIS — R569 Unspecified convulsions: Secondary | ICD-10-CM | POA: Diagnosis not present

## 2015-11-02 NOTE — Patient Instructions (Signed)
Continue Keppra and Vimpat If you have any seizure events please let us know.   

## 2015-11-02 NOTE — Progress Notes (Signed)
I have read the note, and I agree with the clinical assessment and plan.  Dawaun Brancato KEITH   

## 2015-11-02 NOTE — Progress Notes (Signed)
PATIENT: Brandi [name] DOB: February 20, 1956  REASON FOR VISIT: follow up-seizures HISTORY FROM: patient  HISTORY OF PRESENT ILLNESS: Brandi [name] is a 59 year old female with a history of seizures. She returns today for a follow-up visit. She continues on Keppra and Vimpat. She states that she tolerates these medications well. She denies any seizure events. She is able to complete all ADLs independent. She operates a Teacher, music without difficulty. Denies any changes with her gait or balance. Denies any changes with her mood or behavior. She states that she recently had cataract surgery. Denies any new neurological issues. She returns today for an evaluation.  HISTORY 11/02/14 (WILLIS):  Brandi [name] is a 59 year old right-handed white female with a history of seizures. The patient has in the past had a very active EEG study with brief electroconvulsive seizures that are associated with eye blinking. The patient has had only one recent generalized convulsive seizure that occurred in July 2010. She is on Keppra and Vimpat, and she is tolerating these medications well. The patient finds that the Vimpat is difficult to afford, however. The patient is operating motor vehicle. She has had no new significant medical issues that have come up recently. The patient returns for an evaluation. She was last seen through this office around 06/05/2012.  REVIEW OF SYSTEMS: Out of a complete 14 system review of symptoms, the patient complains only of the following symptoms, and all other reviewed systems are negative.  Excessive sweating, moles  ALLERGIES: Allergies  Allergen Reactions  . Penicillins Rash    HOME MEDICATIONS: Outpatient Prescriptions Prior to Visit  Medication Sig Dispense Refill  . Ascorbic Acid (VITAMIN C) 1000 MG tablet Take 2,000 mg by mouth daily.    Marland Kitchen atorvastatin (LIPITOR) 20 MG tablet TAKE 1 TABLET (20 MG TOTAL) BY MOUTH DAILY. 90 tablet 2  . busPIRone (BUSPAR) 30 MG tablet  Take 30 mg by mouth 2 (two) times daily.    . calcium-vitamin D (OSCAL WITH D) 250-125 MG-UNIT per tablet Take 2 tablets by mouth daily. 30 tablet 11  . fish oil-omega-3 fatty acids 1000 MG capsule Take 2 g by mouth daily.     . Garlic 123XX123 MG CAPS Take 1,000 mg by mouth daily.     Marland Kitchen glucose blood (ONETOUCH VERIO) test strip Use 1 strip daily to test blood sugars. Dx code E11.9 100 each 1  . levETIRAcetam (KEPPRA) 500 MG tablet TAKE 2 TABLETS (1,000 MG TOTAL) BY MOUTH 2 (TWO) TIMES DAILY. 360 tablet 2  . lisinopril (PRINIVIL,ZESTRIL) 5 MG tablet TAKE 1 TABLET (5 MG TOTAL) BY MOUTH DAILY. 90 tablet 3  . loratadine (CLARITIN) 10 MG tablet Take 10 mg by mouth daily.     Marland Kitchen LORazepam (ATIVAN) 0.5 MG tablet Take 0.5 mg by mouth every evening.     . magnesium 30 MG tablet Take 30 mg by mouth 2 (two) times daily.     . meclizine (ANTIVERT) 12.5 MG tablet Take 1 tablet (12.5 mg total) by mouth 2 (two) times daily as needed for dizziness. 30 tablet 0  . Multiple Vitamins-Minerals (MULTIVITAMIN WITH MINERALS) tablet Take 1 tablet by mouth daily.     . pantoprazole (PROTONIX) 40 MG tablet TAKE 1 TABLET (40 MG TOTAL) BY MOUTH DAILY. 30 tablet 11  . Riboflavin (VITAMIN B-2 PO) Take 100 mg by mouth 2 (two) times daily.    Marland Kitchen VIMPAT 100 MG TABS TAKE 1 TABLET BY MOUTH TWICE DAILY 180 tablet 1  . venlafaxine (EFFEXOR)  75 MG tablet Take 150 mg by mouth 2 (two) times daily. ONE IN THE AM AND ONE IN PM     No facility-administered medications prior to visit.    PAST MEDICAL HISTORY: Past Medical History  Diagnosis Date  . Dyslipidemia   . Depression     MAJOR  . GERD (gastroesophageal reflux disease)   . Migraines   . Allergic rhinitis   . Schizophrenic disorder ([place])   . 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 History of breast cancer DX 2010--  S/P LUMPECTOMY AND RADIATION -- NO RECURRENCE    ONCOLOGIST- DR Truddie Coco  . Anemia   . Left ureteral calculus   . Left flank pain   .  Arthritis HX RIGHT ANKLE FX  . Type II or unspecified type diabetes mellitus without mention of complication, not stated as uncontrolled 03/16/2014  . Cancer ([place])     breast, lumpectomy    PAST SURGICAL HISTORY: Past Surgical History  Procedure Laterality Date  . Child birth      C-SECTION  . Breast surgery  01-04-2009  DR [facility]    LEFT BREAST LUMPECTOMY W/ SLN DISSECTION  FOR CANCER  . Cesarean section  1998  . Laparoscopic cholecystectomy  1990'S  . Cystoscopy with retrograde pyelogram, ureteroscopy and stent placement  11/10/2012    Procedure: CYSTOSCOPY WITH RETROGRADE PYELOGRAM, URETEROSCOPY AND STENT PLACEMENT;  Surgeon: Hanley Ben, MD;  Location: [facility];  Service: Urology;  Laterality: Left;  WITH DOUBLE J STENT  . Holmium laser application  [id number]    Procedure: HOLMIUM LASER APPLICATION;  Surgeon: Hanley Ben, MD;  Location: [facility];  Service: Urology;  Laterality: Left;    FAMILY HISTORY: Family History  Problem Relation Age of Onset  . Mental illness Maternal Uncle   . Cancer Maternal Grandmother   . Stroke Maternal Grandmother   . Tuberculosis Maternal Grandfather   . Cancer Paternal Grandmother   . Cancer Sister     breast  . Cancer Mother     breast  . Cancer Father     lung  . Seizures Neg Hx     SOCIAL HISTORY: Social History   Social History  . Marital Status: Married    Spouse Name: N/A  . Number of Children: N/A  . Years of Education: N/A   Occupational History  . Not on file.   Social History Main Topics  . Smoking status: Never Smoker   . Smokeless tobacco: Never Used  . Alcohol Use: No  . Drug Use: No  . Sexual Activity: Not Currently   Other Topics Concern  . Not on file   Social History Narrative      PHYSICAL EXAM  Filed Vitals:   11/02/15 1419  BP: 143/88  Pulse: 95  Height: 5\' 1"  (1.549 m)  Weight: 195 lb (88.451 kg)   Body mass index is 36.86  kg/(m^2).  Generalized: Well developed, in no acute distress   Neurological examination  Mentation: Alert oriented to time, place, history taking. Follows all commands speech and language fluent Cranial nerve II-XII: Pupils were equal round reactive to light. Extraocular movements were full, visual field were full on confrontational test. Facial sensation and strength were normal. Uvula tongue midline. Head turning and shoulder shrug  were normal and symmetric. Motor: The motor testing reveals 5 over 5 strength of all 4 extremities. Good symmetric motor tone is noted throughout.  Sensory: Sensory testing is  intact to soft touch on all 4 extremities. No evidence of extinction is noted.  Coordination: Cerebellar testing reveals good finger-nose-finger and heel-to-shin bilaterally.  Gait and station: Gait is normal. Tandem gait is unsteady. Romberg is negative. No drift is seen.  Reflexes: Deep tendon reflexes are symmetric and normal bilaterally.   DIAGNOSTIC DATA (LABS, IMAGING, TESTING) - I reviewed patient records, labs, notes, testing and imaging myself where available.     ASSESSMENT AND PLAN 59 y.o. 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 here with:  1. Seizures  Overall the patient is doing well. She will continue on Keppra and Vimpat. I have provided the patient with a discount card for the Vimpat. Patient advised that if she has any seizure events she should let us know. She will follow-up in  one year or sooner if needed.  Ward Givens, MSN, NP-C 11/02/2015, 3:20 PM [facility] [address], [place], [place] [address] [phone]

## 2015-11-13 ENCOUNTER — Encounter: Payer: Self-pay | Admitting: Family Medicine

## 2015-11-13 ENCOUNTER — Ambulatory Visit (INDEPENDENT_AMBULATORY_CARE_PROVIDER_SITE_OTHER): Payer: Medicare Other | Admitting: Family Medicine

## 2015-11-13 VITALS — BP 124/84 | HR 111 | Ht 61.0 in | Wt 195.0 lb

## 2015-11-13 DIAGNOSIS — F323 Major depressive disorder, single episode, severe with psychotic features: Secondary | ICD-10-CM

## 2015-11-13 DIAGNOSIS — G43009 Migraine without aura, not intractable, without status migrainosus: Secondary | ICD-10-CM

## 2015-11-13 DIAGNOSIS — E1169 Type 2 diabetes mellitus with other specified complication: Secondary | ICD-10-CM

## 2015-11-13 DIAGNOSIS — E669 Obesity, unspecified: Secondary | ICD-10-CM | POA: Diagnosis not present

## 2015-11-13 DIAGNOSIS — Z853 Personal history of malignant neoplasm of breast: Secondary | ICD-10-CM | POA: Diagnosis not present

## 2015-11-13 DIAGNOSIS — E1136 Type 2 diabetes mellitus with diabetic cataract: Secondary | ICD-10-CM | POA: Diagnosis not present

## 2015-11-13 DIAGNOSIS — E785 Hyperlipidemia, unspecified: Secondary | ICD-10-CM

## 2015-11-13 DIAGNOSIS — K219 Gastro-esophageal reflux disease without esophagitis: Secondary | ICD-10-CM

## 2015-11-13 DIAGNOSIS — J301 Allergic rhinitis due to pollen: Secondary | ICD-10-CM | POA: Diagnosis not present

## 2015-11-13 DIAGNOSIS — Z1159 Encounter for screening for other viral diseases: Secondary | ICD-10-CM | POA: Diagnosis not present

## 2015-11-13 LAB — LIPID PANEL
CHOL/HDL RATIO: 2.4 ratio (ref <=5.0)
CHOLESTEROL: 161 mg/dL (ref 125–200)
HDL: 67 mg/dL (ref >=46)
LDL Cholesterol: 44 mg/dL (ref <130)
Triglycerides: 250 mg/dL — ABNORMAL HIGH (ref <150)
VLDL: 50 mg/dL — ABNORMAL HIGH (ref <30)

## 2015-11-13 LAB — CBC WITH DIFFERENTIAL/PLATELET
BASOS ABS: 0 10*3/uL (ref 0.0–0.1)
BASOS PCT: 0 % (ref 0–1)
EOS ABS: 0.2 10*3/uL (ref 0.0–0.7)
EOS PCT: 2 % (ref 0–5)
HCT: 39.9 % (ref 36.0–46.0)
Hemoglobin: 12.5 g/dL (ref 12.0–15.0)
LYMPHS ABS: 2.8 10*3/uL (ref 0.7–4.0)
Lymphocytes Relative: 27 % (ref 12–46)
MCH: 24.1 pg — ABNORMAL LOW (ref 26.0–34.0)
MCHC: 31.3 g/dL (ref 30.0–36.0)
MCV: 76.9 fL — ABNORMAL LOW (ref 78.0–100.0)
MPV: 9.3 fL (ref 8.6–12.4)
Monocytes Absolute: 0.8 10*3/uL (ref 0.1–1.0)
Monocytes Relative: 8 % (ref 3–12)
NEUTROS PCT: 63 % (ref 43–77)
Neutro Abs: 6.6 10*3/uL (ref 1.7–7.7)
PLATELETS: 580 10*3/uL — AB (ref 150–400)
RBC: 5.19 MIL/uL — AB (ref 3.87–5.11)
RDW: 17.2 % — AB (ref 11.5–15.5)
WBC: 10.4 10*3/uL (ref 4.0–10.5)

## 2015-11-13 LAB — POCT GLYCOSYLATED HEMOGLOBIN (HGB A1C): Hemoglobin A1C: 6.9

## 2015-11-13 LAB — COMPREHENSIVE METABOLIC PANEL
ALBUMIN: 4.8 g/dL (ref 3.6–5.1)
ALK PHOS: 152 U/L — AB (ref 33–130)
ALT: 28 U/L (ref 6–29)
AST: 22 U/L (ref 10–35)
BILIRUBIN TOTAL: 0.5 mg/dL (ref 0.2–1.2)
BUN: 15 mg/dL (ref 7–25)
CALCIUM: 10 mg/dL (ref 8.6–10.4)
CO2: 27 mmol/L (ref 20–31)
CREATININE: 1.08 mg/dL — AB (ref 0.50–1.05)
Chloride: 101 mmol/L (ref 98–110)
Glucose, Bld: 119 mg/dL — ABNORMAL HIGH (ref 65–99)
Potassium: 3.8 mmol/L (ref 3.5–5.3)
Sodium: 141 mmol/L (ref 135–146)
TOTAL PROTEIN: 7.4 g/dL (ref 6.1–8.1)

## 2015-11-13 LAB — POCT UA - MICROALBUMIN
Albumin/Creatinine Ratio, Urine, POC: 69.5
Creatinine, POC: 253.6 mg/dL
MICROALBUMIN (UR) POC: 176.2 mg/L

## 2015-11-13 NOTE — Progress Notes (Signed)
  Subjective:    Patient ID: Brandi [name], female    DOB: 10/19/1956, 59 y.o.   MRN: [id number]  Brandi [name] is a 59 y.o. female who presents for follow-up of Type 2 diabetes mellitus.  Home blood sugar records: Patient test one time a week Current symptoms/problems include none Daily foot checks:yes   Any foot concerns: none Exercise: riding bike rarely Eye: 10/02/15 She continues to be followed by psychiatry and see if you doing fairly well with that. Her migraines are under good control. She also has underlying allergies and is doing well with that. Her breast cancer was diagnosed in 2009 and now she is essentially cancer free. She does use Protonix more or less on an as-needed basis. He has had one cataract fixed and is scheduled for another one December 8. The following portions of the patient's history were reviewed and updated as appropriate: allergies, current medications, past medical history, past social history and problem list.  ROS as in subjective above.     Objective:    Physical Exam Alert and in no distress otherwise not examined.  There were no vitals taken for this visit.  Lab Review Diabetic Labs Latest Ref Rng 06/27/2015 10/24/2014 09/19/2014 07/18/2014 06/10/2014  HbA1c <5.7 % 6.8(H) - - 6.5 -  Chol 0 - 200 mg/dL - - 187 - -  HDL >39 mg/dL - - 74 - -  Calc LDL 0 - 99 mg/dL - - 67 - -  Triglycerides <150 mg/dL - - 232(H) - -  Creatinine 0.6 - 1.1 mg/dL - 0.8 - - 0.77   BP/Weight 11/02/2015 09/21/2015 08/03/2015 [id number]  Systolic BP [id number] 123456 123456 [id number]  Diastolic BP 88 70 78 80 90  Wt. (Lbs) 195 192 184 189 189.2  BMI 36.86 36.3 35.94 36.91 36.95   Foot/eye exam completion dates Latest Ref Rng 10/02/2015 09/26/2014  Eye Exam No Retinopathy No Retinopathy No Retinopathy  Foot Form Completion - - -   A1c is 6.9  Jour[place]  reports that she has never smoked. She has never used smokeless tobacco. She reports that she does not drink alcohol or use  illicit drugs.     Assessment & Plan:    Cataract associated with type 2 diabetes mellitus ([place]) - Plan: POCT glycosylated hemoglobin (Hb A1C), POCT UA - Microalbumin, Ambulatory referral to Podiatry  Hyperlipidemia associated with type 2 diabetes mellitus ([place]) - Plan: Lipid panel  Severe single current episode of major depressive disorder, with psychotic features ([place])  Obesity (BMI 30-39.9) - Plan: Comprehensive metabolic panel, CBC with Differential/Platelet  Migraine without aura and without status migrainosus, not intractable  Gastroesophageal reflux disease without esophagitis  Seasonal allergic rhinitis due to pollen  History of breast cancer in female  Need for hepatitis C screening test - Plan: Hepatitis C antibody   1. Rx changes: none 2. Education: Reviewed 'ABCs' of diabetes management (respective goals in parentheses):  A1C (<7), blood pressure (<130/80), and cholesterol (LDL <100). 3. Compliance at present is estimated to be fair. Efforts to improve compliance (if necessary) will be directed at increased exercise. 4. Follow up: 4 months  5. I encouraged her to become more physically active as her weight is going up as is her A1c. Explained that she will need to be placed on medication unless she makes major changes.

## 2015-11-13 NOTE — Patient Instructions (Signed)
150 minutes a week of something physical which translates into 20 minutes every day. Get off you're dead butt!!!

## 2015-11-14 ENCOUNTER — Encounter: Payer: Self-pay | Admitting: Family Medicine

## 2015-11-14 LAB — HEPATITIS C ANTIBODY: HCV AB: NEGATIVE

## 2015-11-14 NOTE — Progress Notes (Signed)
   Subjective:    Patient ID: Brandi [name], female    DOB: May 09, 1956, 59 y.o.   MRN: [id number]  HPI    Review of Systems     Objective:   Physical Exam        Assessment & Plan:  Blood work looks good. She'll be told and make sure she takes a good multivitamin with iron

## 2015-11-16 ENCOUNTER — Other Ambulatory Visit: Payer: Self-pay | Admitting: Family Medicine

## 2015-11-23 DIAGNOSIS — H2511 Age-related nuclear cataract, right eye: Secondary | ICD-10-CM | POA: Diagnosis not present

## 2015-11-23 DIAGNOSIS — H25811 Combined forms of age-related cataract, right eye: Secondary | ICD-10-CM | POA: Diagnosis not present

## 2015-11-24 ENCOUNTER — Other Ambulatory Visit: Payer: Self-pay | Admitting: Neurology

## 2015-12-14 ENCOUNTER — Encounter: Payer: Self-pay | Admitting: Podiatry

## 2015-12-14 ENCOUNTER — Ambulatory Visit (INDEPENDENT_AMBULATORY_CARE_PROVIDER_SITE_OTHER): Payer: Medicare Other | Admitting: Podiatry

## 2015-12-14 ENCOUNTER — Ambulatory Visit: Payer: Medicare Other

## 2015-12-14 VITALS — BP 130/80 | HR 92 | Resp 12

## 2015-12-14 DIAGNOSIS — M79676 Pain in unspecified toe(s): Secondary | ICD-10-CM

## 2015-12-14 DIAGNOSIS — Z0189 Encounter for other specified special examinations: Secondary | ICD-10-CM

## 2015-12-14 DIAGNOSIS — E119 Type 2 diabetes mellitus without complications: Secondary | ICD-10-CM | POA: Diagnosis not present

## 2015-12-14 DIAGNOSIS — B351 Tinea unguium: Secondary | ICD-10-CM

## 2015-12-14 DIAGNOSIS — M79606 Pain in leg, unspecified: Secondary | ICD-10-CM

## 2015-12-14 NOTE — Progress Notes (Signed)
   Subjective:    Patient ID: Brandi [name], female    DOB: 03/20/1956, 59 y.o.   MRN: [id number]  HPI PT STATED B/L TOENAILS ARE PAINFUL, LONG, AND HAVE DISCOLORATION FOR LONG TERM. TRIED NO TREATMENT.   Review of Systems  All other systems reviewed and are negative.      Objective:   Physical Exam        Assessment & Plan:

## 2015-12-14 NOTE — Progress Notes (Signed)
Subjective:     Patient ID: Brandi [name], female   DOB: [birth year], 59 y.o.   MRN: [id number]  HPI patient presents stating I have long painful nails on both feet that I cannot cut and I do have diabetes and I have been trying to change my shoe gear without relief   Review of Systems  All other systems reviewed and are negative.      Objective:   Physical Exam  Constitutional: She is oriented to person, place, and time.  Cardiovascular: Intact distal pulses.   Musculoskeletal: Normal range of motion.  Neurological: She is oriented to person, place, and time.  Skin: Skin is warm and dry.  Nursing note and vitals reviewed.  neurovascular status found to be unchanged with thick yellow brittle nailbeds 2 through 5 right and 1 through 5 left with pain and deformity associated with them. Patient's found have good digital perfusion is well oriented 3     Assessment:      mycotic nail infection bilateral with thickness and incurvation of the underlying bed    Plan:      H&P conditions removed and debridement accomplished with diabetic education rendered. Patient will be seen back 3 months or earlier if needed

## 2016-03-01 ENCOUNTER — Ambulatory Visit (INDEPENDENT_AMBULATORY_CARE_PROVIDER_SITE_OTHER): Payer: Medicare Other | Admitting: Family Medicine

## 2016-03-01 ENCOUNTER — Encounter: Payer: Self-pay | Admitting: Family Medicine

## 2016-03-01 VITALS — BP 130/100 | HR 92 | Ht 61.0 in | Wt 198.0 lb

## 2016-03-01 DIAGNOSIS — Z23 Encounter for immunization: Secondary | ICD-10-CM

## 2016-03-01 DIAGNOSIS — E1169 Type 2 diabetes mellitus with other specified complication: Secondary | ICD-10-CM

## 2016-03-01 DIAGNOSIS — E118 Type 2 diabetes mellitus with unspecified complications: Secondary | ICD-10-CM

## 2016-03-01 DIAGNOSIS — E669 Obesity, unspecified: Secondary | ICD-10-CM | POA: Diagnosis not present

## 2016-03-01 DIAGNOSIS — E785 Hyperlipidemia, unspecified: Secondary | ICD-10-CM

## 2016-03-01 DIAGNOSIS — F323 Major depressive disorder, single episode, severe with psychotic features: Secondary | ICD-10-CM | POA: Diagnosis not present

## 2016-03-01 DIAGNOSIS — I10 Essential (primary) hypertension: Secondary | ICD-10-CM

## 2016-03-01 DIAGNOSIS — I152 Hypertension secondary to endocrine disorders: Secondary | ICD-10-CM

## 2016-03-01 DIAGNOSIS — E1159 Type 2 diabetes mellitus with other circulatory complications: Secondary | ICD-10-CM

## 2016-03-01 LAB — POCT GLYCOSYLATED HEMOGLOBIN (HGB A1C): Hemoglobin A1C: 6.8

## 2016-03-01 NOTE — Patient Instructions (Addendum)
Get back on your exercise routine and also the gout information from the diabetes and nutrition people.Read it again 20 minutes every day of something physical.

## 2016-03-01 NOTE — Progress Notes (Signed)
  Subjective:    Patient ID: Brandi [name], female    DOB: Apr 14, 1956, 60 y.o.   MRN: [id number]  Brandi [name] is a 60 y.o. female who presents for follow-up of Type 2 diabetes mellitus.she continues to be followed by psychiatry for her underlying major depression.  Patient is checking home blood sugars.   Home blood sugar records: BGs consistently in an acceptable range How often is blood sugars being checked: Once a day Current symptoms/problems include none and have been unchanged. Daily foot checks: Yes   Any foot concerns: NO Last eye exam: November 2016 Exercise: The patient does not participate in regular exercise at present.  The following portions of the patient's history were reviewed and updated as appropriate: allergies, current medications, past medical history, past social history and problem list.  ROS as in subjective above.     Objective:    Physical Exam Alert and in no distress otherwise not examined.  Height 5\' 1"  (1.549 m), weight 198 lb (89.812 kg).  Lab Review Diabetic Labs Latest Ref Rng 11/13/2015 06/27/2015 10/24/2014 09/19/2014 07/18/2014  HbA1c - 6.9 6.8(H) - - 6.5  Chol 125 - 200 mg/dL 161 - - 187 -  HDL >=46 mg/dL 67 - - 74 -  Calc LDL <130 mg/dL 44 - - 67 -  Triglycerides <150 mg/dL 250(H) - - 232(H) -  Creatinine 0.50 - 1.05 mg/dL 1.08(H) - 0.8 - -   BP/Weight 03/01/2016 12/14/2015 11/13/2015 11/02/2015 [id number]  Systolic BP - [id number] A999333 123456  Diastolic BP - 80 84 88 70  Wt. (Lbs) 198 - 195 195 192  BMI 37.43 - 36.86 36.86 36.3   Foot/eye exam completion dates Latest Ref Rng 11/13/2015 10/02/2015  Eye Exam No Retinopathy - No Retinopathy  Foot Form Completion - Done -  A1c is 6.8  Brandi [name]  reports that she has never smoked. She has never used smokeless tobacco. She reports that she does not drink alcohol or use illicit drugs.     Assessment & Plan:    Type 2 diabetes mellitus with complication, without long-term current use of  insulin (HCC) - Plan: HgB A1c  Need for shingles vaccine - Plan: Varicella-zoster vaccine subcutaneous  Hyperlipidemia associated with type 2 diabetes mellitus (H[facility])  Severe single current episode of major depressive disorder, with psychotic features (H[facility])  Obesity (BMI 30-39.9)  Hypertension associated with diabetes ([place])    1. Rx changes: none 2. Education: Reviewed 'ABCs' of diabetes management (respective goals in parentheses):  A1C (<7), blood pressure (<130/80), and cholesterol (LDL <100). 3. Compliance at present is estimated to be fair. Efforts to improve compliance (if necessary) will be directed at increased exercise. 4. Follow up: 4 months  5. I discussed diet and exercise with her relating it to her inseparable. She admits to being off of a regular exercise program over the last several months. May need to readjust her medication with her next visit.

## 2016-03-12 ENCOUNTER — Encounter: Payer: Self-pay | Admitting: Podiatry

## 2016-03-12 ENCOUNTER — Ambulatory Visit (INDEPENDENT_AMBULATORY_CARE_PROVIDER_SITE_OTHER): Payer: Medicare Other | Admitting: Podiatry

## 2016-03-12 DIAGNOSIS — M79606 Pain in leg, unspecified: Secondary | ICD-10-CM

## 2016-03-12 DIAGNOSIS — B351 Tinea unguium: Secondary | ICD-10-CM | POA: Diagnosis not present

## 2016-03-12 NOTE — Progress Notes (Signed)
Patient ID: Brandi [name], female   DOB: [birth year], 60 y.o.   MRN: [id number] Complaint:  Visit Type: Patient returns to my office for continued preventative foot care services. Complaint: Patient states" my nails have grown long and thick and become painful to walk and wear shoes" Patient has been diagnosed with DM with no foot complications. The patient presents for preventative foot care services. No changes to ROS  Podiatric Exam: Vascular: dorsalis pedis and posterior tibial pulses are palpable bilateral. Capillary return is immediate. Temperature gradient is WNL. Skin turgor WNL  Sensorium: Normal Semmes Weinstein monofilament test. Normal tactile sensation bilaterally. Nail Exam: Pt has thick disfigured discolored nails with subungual debris noted bilateral entire nail hallux through fifth toenails Ulcer Exam: There is no evidence of ulcer or pre-ulcerative changes or infection. Orthopedic Exam: Muscle tone and strength are WNL. No limitations in general ROM. No crepitus or effusions noted. Foot type and digits show no abnormalities. Bony prominences are unremarkable. Skin: No Porokeratosis. No infection or ulcers  Diagnosis:  Onychomycosis, , Pain in right toe, pain in left toes  Treatment & Plan Procedures and Treatment: Consent by patient was obtained for treatment procedures. The patient understood the discussion of treatment and procedures well. All questions were answered thoroughly reviewed. Debridement of mycotic and hypertrophic toenails, 1 through 5 bilateral and clearing of subungual debris. No ulceration, no infection noted.  Return Visit-Office Procedure: Patient instructed to return to the office for a follow up visit 3 months for continued evaluation and treatment.    Gardiner Barefoot DPM

## 2016-03-15 ENCOUNTER — Other Ambulatory Visit: Payer: Self-pay | Admitting: Neurology

## 2016-04-18 DIAGNOSIS — Z853 Personal history of malignant neoplasm of breast: Secondary | ICD-10-CM | POA: Diagnosis not present

## 2016-04-18 LAB — HM MAMMOGRAPHY

## 2016-05-27 DIAGNOSIS — F323 Major depressive disorder, single episode, severe with psychotic features: Secondary | ICD-10-CM | POA: Diagnosis not present

## 2016-06-11 ENCOUNTER — Ambulatory Visit (INDEPENDENT_AMBULATORY_CARE_PROVIDER_SITE_OTHER): Payer: Medicare Other | Admitting: Podiatry

## 2016-06-11 ENCOUNTER — Encounter: Payer: Self-pay | Admitting: Podiatry

## 2016-06-11 DIAGNOSIS — M79676 Pain in unspecified toe(s): Secondary | ICD-10-CM

## 2016-06-11 DIAGNOSIS — B351 Tinea unguium: Secondary | ICD-10-CM | POA: Diagnosis not present

## 2016-06-11 DIAGNOSIS — M79606 Pain in leg, unspecified: Secondary | ICD-10-CM

## 2016-06-11 NOTE — Progress Notes (Signed)
Patient ID: Brandi [name], female   DOB: [birth year], 60 y.o.   MRN: [id number] Complaint:  Visit Type: Patient returns to my office for continued preventative foot care services. Complaint: Patient states" my nails have grown long and thick and become painful to walk and wear shoes" Patient has been diagnosed with DM with no foot complications. The patient presents for preventative foot care services. No changes to ROS  Podiatric Exam: Vascular: dorsalis pedis and posterior tibial pulses are palpable bilateral. Capillary return is immediate. Temperature gradient is WNL. Skin turgor WNL  Sensorium: Normal Semmes Weinstein monofilament test. Normal tactile sensation bilaterally. Nail Exam: Pt has thick disfigured discolored nails with subungual debris noted bilateral entire nail hallux through fifth toenails Ulcer Exam: There is no evidence of ulcer or pre-ulcerative changes or infection. Orthopedic Exam: Muscle tone and strength are WNL. No limitations in general ROM. No crepitus or effusions noted. Foot type and digits show no abnormalities. Bony prominences are unremarkable. Skin: No Porokeratosis. No infection or ulcers  Diagnosis:  Onychomycosis, , Pain in right toe, pain in left toes  Treatment & Plan Procedures and Treatment: Consent by patient was obtained for treatment procedures. The patient understood the discussion of treatment and procedures well. All questions were answered thoroughly reviewed. Debridement of mycotic and hypertrophic toenails, 1 through 5 bilateral and clearing of subungual debris. No ulceration, no infection noted.  Return Visit-Office Procedure: Patient instructed to return to the office for a follow up visit 3 months for continued evaluation and treatment.    Gardiner Barefoot DPM

## 2016-06-16 ENCOUNTER — Other Ambulatory Visit: Payer: Self-pay | Admitting: Family Medicine

## 2016-07-09 ENCOUNTER — Encounter: Payer: Self-pay | Admitting: Family Medicine

## 2016-07-09 ENCOUNTER — Ambulatory Visit (INDEPENDENT_AMBULATORY_CARE_PROVIDER_SITE_OTHER): Payer: Medicare Other | Admitting: Family Medicine

## 2016-07-09 VITALS — BP 126/74 | HR 80 | Ht 61.0 in | Wt 200.0 lb

## 2016-07-09 DIAGNOSIS — E669 Obesity, unspecified: Secondary | ICD-10-CM | POA: Diagnosis not present

## 2016-07-09 DIAGNOSIS — E785 Hyperlipidemia, unspecified: Secondary | ICD-10-CM

## 2016-07-09 DIAGNOSIS — I10 Essential (primary) hypertension: Secondary | ICD-10-CM

## 2016-07-09 DIAGNOSIS — E118 Type 2 diabetes mellitus with unspecified complications: Secondary | ICD-10-CM | POA: Diagnosis not present

## 2016-07-09 DIAGNOSIS — I152 Hypertension secondary to endocrine disorders: Secondary | ICD-10-CM

## 2016-07-09 DIAGNOSIS — E1136 Type 2 diabetes mellitus with diabetic cataract: Secondary | ICD-10-CM

## 2016-07-09 DIAGNOSIS — E1169 Type 2 diabetes mellitus with other specified complication: Secondary | ICD-10-CM

## 2016-07-09 DIAGNOSIS — E1159 Type 2 diabetes mellitus with other circulatory complications: Secondary | ICD-10-CM | POA: Diagnosis not present

## 2016-07-09 LAB — POCT GLYCOSYLATED HEMOGLOBIN (HGB A1C): HEMOGLOBIN A1C: 7

## 2016-07-09 NOTE — Progress Notes (Signed)
  Subjective:    Patient ID: Brandi [name], female    DOB: 1956/12/06, 60 y.o.   MRN: [id number]  Brandi [name] is a 60 y.o. female who presents for follow-up of Type 2 diabetes mellitus.  Patient is not checking home blood sugars.   Home blood sugar records:? How often is blood sugars being checked: she checks every so often Current symptoms/problems none Daily foot checks: yes  Any foot concerns: none Last eye exam: 10/17/16She does have underlying cataracts and is seen regularly for this. Exercise: none. She states that she doesn't have the time because she is always reading the Bible. She continues on her blood pressure medications as well as psychotropics and statins. Follow up regularly with her psychiatrist. The following portions of the patient's history were reviewed and updated as appropriate: allergies, current medications, past medical history, past social history and problem list.  ROS as in subjective above.     Objective:    Physical Exam Alert and in no distress otherwise not examined.  Lab Review Diabetic Labs Latest Ref Rng & Units 03/01/2016 11/13/2015 06/27/2015 10/24/2014 09/19/2014  HbA1c - 6.8 6.9 6.8(H) - -  Chol 125 - 200 mg/dL - 161 - - 187  HDL >=46 mg/dL - 67 - - 74  Calc LDL <130 mg/dL - 44 - - 67  Triglycerides <150 mg/dL - 250(H) - - 232(H)  Creatinine 0.50 - 1.05 mg/dL - 1.08(H) - 0.8 -   BP/Weight 03/01/2016 12/14/2015 11/13/2015 11/02/2015 [id number]  Systolic BP [id number] A999333 A999333 123456  Diastolic BP 123XX123 80 84 88 70  Wt. (Lbs) 198 - 195 195 192  BMI 37.43 - 36.86 36.86 36.3   Foot/eye exam completion dates Latest Ref Rng & Units 11/13/2015 10/02/2015  Eye Exam No Retinopathy - No Retinopathy  Foot Form Completion - Done -  Suanna  reports that she has never smoked. She has never used smokeless tobacco. She reports that she does not drink alcohol or use drugs.  A1c is 7.0    Assessment & Plan:    Type 2 diabetes mellitus with complication,  without long-term current use of insulin (HCC) - Plan: POCT glycosylated hemoglobin (Hb A1C)  Obesity (BMI 30-39.9)  Hyperlipidemia associated with type 2 diabetes mellitus ([place])  Hypertension associated with diabetes ([place])  Cataract associated with type 2 diabetes mellitus ([place])   1. Rx changes: none 2. Education: Reviewed 'ABCs' of diabetes management (respective goals in parentheses):  A1C (<7), blood pressure (<130/80), and cholesterol (LDL <100). 3. Compliance at present is estimated to be fair. Efforts to improve compliance (if necessary) will be directed at increased exercise. 4. Follow up: 4 months  5. Athough she is noncompliant with her overall care, he seems to be doing okay with her overall diabetes. Did strongly encourage her to get involved with regular exercise.

## 2016-07-18 ENCOUNTER — Ambulatory Visit (INDEPENDENT_AMBULATORY_CARE_PROVIDER_SITE_OTHER): Payer: Medicare Other | Admitting: Family Medicine

## 2016-07-18 VITALS — BP 140/90 | HR 114 | Wt 199.0 lb

## 2016-07-18 DIAGNOSIS — H6123 Impacted cerumen, bilateral: Secondary | ICD-10-CM | POA: Diagnosis not present

## 2016-07-18 NOTE — Progress Notes (Signed)
   Subjective:    Patient ID: Brandi [name], female    DOB: Oct 14, 1956, 60 y.o.   MRN: [id number]  HPI He complains of difficulty hearing from both ears, right greater than left but no earache, sore throat,fever or chills.   Review of Systems     Objective:   Physical Exam Cerumen was lavaged from both canals. The canals and tympanic membranes were normal.       Assessment & Plan:  Cerumen impaction, bilateral

## 2016-08-23 ENCOUNTER — Other Ambulatory Visit: Payer: Self-pay | Admitting: Neurology

## 2016-09-05 ENCOUNTER — Other Ambulatory Visit: Payer: Self-pay | Admitting: Family Medicine

## 2016-09-17 ENCOUNTER — Encounter: Payer: Self-pay | Admitting: Podiatry

## 2016-09-17 ENCOUNTER — Ambulatory Visit (INDEPENDENT_AMBULATORY_CARE_PROVIDER_SITE_OTHER): Payer: Medicare Other | Admitting: Podiatry

## 2016-09-17 ENCOUNTER — Other Ambulatory Visit: Payer: Self-pay | Admitting: Neurology

## 2016-09-17 DIAGNOSIS — E119 Type 2 diabetes mellitus without complications: Secondary | ICD-10-CM | POA: Diagnosis not present

## 2016-09-17 DIAGNOSIS — B351 Tinea unguium: Secondary | ICD-10-CM

## 2016-09-17 DIAGNOSIS — M79676 Pain in unspecified toe(s): Secondary | ICD-10-CM | POA: Diagnosis not present

## 2016-09-17 DIAGNOSIS — M79606 Pain in leg, unspecified: Secondary | ICD-10-CM

## 2016-09-17 NOTE — Progress Notes (Signed)
Patient ID: Brandi [name], female   DOB: 12/01/1956, 60 y.o.   MRN: [id number] Complaint:  Visit Type: Patient returns to my office for continued preventative foot care services. Complaint: Patient states" my nails have grown long and thick and become painful to walk and wear shoes" Patient has been diagnosed with DM with no foot complications. The patient presents for preventative foot care services. No changes to ROS  Podiatric Exam: Vascular: dorsalis pedis and posterior tibial pulses are palpable bilateral. Capillary return is immediate. Temperature gradient is WNL. Skin turgor WNL  Sensorium: Normal Semmes Weinstein monofilament test. Normal tactile sensation bilaterally. Nail Exam: Pt has thick disfigured discolored nails with subungual debris noted bilateral entire nail hallux through fifth toenails Ulcer Exam: There is no evidence of ulcer or pre-ulcerative changes or infection. Orthopedic Exam: Muscle tone and strength are WNL. No limitations in general ROM. No crepitus or effusions noted. Foot type and digits show no abnormalities. Bony prominences are unremarkable. Skin: No Porokeratosis. No infection or ulcers  Diagnosis:  Onychomycosis, , Pain in right toe, pain in left toes  Treatment & Plan Procedures and Treatment: Consent by patient was obtained for treatment procedures. The patient understood the discussion of treatment and procedures well. All questions were answered thoroughly reviewed. Debridement of mycotic and hypertrophic toenails, 1 through 5 bilateral and clearing of subungual debris. No ulceration, no infection noted.  Return Visit-Office Procedure: Patient instructed to return to the office for a follow up visit 3 months for continued evaluation and treatment.    Gardiner Barefoot DPM

## 2016-09-18 NOTE — Telephone Encounter (Signed)
Rx printed, signed, faxed to pharmacy. 

## 2016-09-24 ENCOUNTER — Encounter: Payer: Self-pay | Admitting: Family Medicine

## 2016-09-24 ENCOUNTER — Ambulatory Visit (INDEPENDENT_AMBULATORY_CARE_PROVIDER_SITE_OTHER): Payer: Medicare Other | Admitting: Family Medicine

## 2016-09-24 VITALS — BP 120/80 | HR 106 | Ht 61.0 in | Wt 195.0 lb

## 2016-09-24 DIAGNOSIS — Z23 Encounter for immunization: Secondary | ICD-10-CM | POA: Diagnosis not present

## 2016-09-24 DIAGNOSIS — E785 Hyperlipidemia, unspecified: Secondary | ICD-10-CM | POA: Diagnosis not present

## 2016-09-24 DIAGNOSIS — E1136 Type 2 diabetes mellitus with diabetic cataract: Secondary | ICD-10-CM

## 2016-09-24 DIAGNOSIS — E118 Type 2 diabetes mellitus with unspecified complications: Secondary | ICD-10-CM | POA: Diagnosis not present

## 2016-09-24 DIAGNOSIS — Z0000 Encounter for general adult medical examination without abnormal findings: Secondary | ICD-10-CM | POA: Diagnosis not present

## 2016-09-24 DIAGNOSIS — G43009 Migraine without aura, not intractable, without status migrainosus: Secondary | ICD-10-CM

## 2016-09-24 DIAGNOSIS — E1169 Type 2 diabetes mellitus with other specified complication: Secondary | ICD-10-CM

## 2016-09-24 DIAGNOSIS — J301 Allergic rhinitis due to pollen: Secondary | ICD-10-CM | POA: Diagnosis not present

## 2016-09-24 DIAGNOSIS — K219 Gastro-esophageal reflux disease without esophagitis: Secondary | ICD-10-CM | POA: Diagnosis not present

## 2016-09-24 DIAGNOSIS — Z853 Personal history of malignant neoplasm of breast: Secondary | ICD-10-CM

## 2016-09-24 DIAGNOSIS — R569 Unspecified convulsions: Secondary | ICD-10-CM | POA: Diagnosis not present

## 2016-09-24 DIAGNOSIS — E669 Obesity, unspecified: Secondary | ICD-10-CM | POA: Diagnosis not present

## 2016-09-24 DIAGNOSIS — F323 Major depressive disorder, single episode, severe with psychotic features: Secondary | ICD-10-CM

## 2016-09-24 NOTE — Progress Notes (Signed)
Subjective:   HPI  Brandi [name] is a 60 y.o. female who presents for a complete physical and annual wellness check. Medical care team includes:  Dr.Maye  Dr.Willis  Dr.Caudil   Preventative care: Last ophthalmology visit:01/2017 Last dental visit:appt Scheduled Last colonoscopy:11/08/13 Last mammogram:04/18/16 Last gynecological exam: Pap 2011 Last EKG:06/20/13 Last labs:11/13/15 Eye Feb Prior vaccinations: TD or Tdap: ? Influenza:09/15/15 Pneumococcal:23:09/16/13 13:09/19/14 Shingles/Zostavax:03/01/16  Advanced directive: Yes. Instructed to bring a copy to the office. Concerns: She continues to be followed by Dr. Jannifer Franklin for her seizures as well as migraines and is doing well on this. Her reflux symptoms are under good control using the Protonix. She does have a previous history of renal stone but none recently. Her arthritis gets her very little difficulty. Her allergies are under good control. She does have cataracts and does follow-up with ophthalmology. She continues to be followed by psychiatry for her underlying psychiatric issue. She states she has lost several pounds within the last month or 2. She does have underlying history of breast cancer however presently is not under their care. Presently her diabetes seems to be under fairly good control. Her medications were reviewed. Reviewed their medical, surgical, family, social, medication, and allergy history and updated chart as appropriate.    Review of Systems Constitutional: -fever, -chills, -sweats, -unexpected weight change, -decreased appetite, -fatigue Allergy: -sneezing, -itching, -congestion Dermatology: -changing moles, --rash, -lumps ENT: -runny nose, -ear pain, -sore throat, -hoarseness, -sinus pain, -teeth pain, - ringing in ears, -hearing loss, -nosebleeds Cardiology: -chest pain, -palpitations, -swelling, -difficulty breathing when lying flat, -waking up short of breath Respiratory: -cough, -shortness of  breath, -difficulty breathing with exercise or exertion, -wheezing, -coughing up blood Gastroenterology: -abdominal pain, -nausea, -vomiting, -diarrhea, -constipation, -blood in stool, -changes in bowel movement, -difficulty swallowing or eating Musculoskeletal: -joint aches, -muscle aches, -joint swelling, -back pain, -neck pain, -cramping, -changes in gait Ophthalmology: denies vision changes, eye redness, itching, discharge  Neurology: -headache, -weakness, -tingling, -numbness, -memory loss, -falls, -dizziness Psychology: -depressed mood, -agitation, -sleep problems     Objective:   Physical Exam General appearance: alert, no distress, WD/WN,  Skin: Normal HEENT: normocephalic, conjunctiva/corneas normal, sclerae anicteric, PERRLA, EOMi, nares patent, no discharge or erythema, pharynx normal Oral cavity: MMM, tongue normal, teeth normal Neck: supple, no lymphadenopathy, no thyromegaly, no masses, normal ROM Chest: non tender, normal shape and expansion Heart: RRR, normal S1, S2, no murmurs Lungs: CTA bilaterally, no wheezes, rhonchi, or rales Abdomen: +bs, soft, non tender, non distended, no masses, no hepatomegaly, no splenomegaly, no bruits Musculoskeletal: upper extremities non tender, no obvious deformity, normal ROM throughout, lower extremities non tender, no obvious deformity, normal ROM throughout Extremities: no edema, no cyanosis, no clubbing Pulses: 2+ symmetric, upper and lower extremities, normal cap refill Neurological: alert, oriented x 3, CN2-12 intact, strength normal upper extremities and lower extremities, sensation normal throughout, DTRs 2+ throughout, no cerebellar signs, gait normal Psychiatric: normal affect, behavior normal, pleasant     Assessment and Plan :   Migraine without aura and without status migrainosus, not intractable  Seasonal allergic rhinitis due to pollen, unspecified chronicity  Gastroesophageal reflux disease without  esophagitis  Hyperlipidemia associated with type 2 diabetes mellitus ([employer])  Cataract associated with type 2 diabetes mellitus ([place])  Type 2 diabetes mellitus with complication, without long-term current use of insulin (HCC)  Severe single current episode of major depressive disorder, with psychotic features (HCC)  Obesity (BMI 30-39.9)  History of breast cancer in female  Convulsions, unspecified convulsion  type [facility])  Need for prophylactic vaccination against Streptococcus pneumoniae (pneumococcus) - Plan: Flu Vaccine QUAD 36+ mos IM  Routine general medical examination at a health care facility Encouraged her to go ahead and get the shots from her pharmacy concerning tetanus as well as shingles. Encouraged her to continue with her weight loss as well as on all of her other medications but did recommend to use Protonix on an as-needed basis. She will be set up for a Pap with her next visit.     Physical exam - discussed healthy lifestyle, diet, exercise, preventative care, vaccinations, and addressed their concerns.     Follow-up 4 months.

## 2016-09-24 NOTE — Patient Instructions (Signed)
Use the Pantoprazole as needed for stomach trouble

## 2016-10-11 ENCOUNTER — Other Ambulatory Visit: Payer: Self-pay | Admitting: Family Medicine

## 2016-10-31 ENCOUNTER — Encounter: Payer: Self-pay | Admitting: Adult Health

## 2016-10-31 ENCOUNTER — Ambulatory Visit (INDEPENDENT_AMBULATORY_CARE_PROVIDER_SITE_OTHER): Payer: Medicare Other | Admitting: Adult Health

## 2016-10-31 VITALS — BP 137/95 | HR 105 | Ht 61.0 in | Wt 196.4 lb

## 2016-10-31 DIAGNOSIS — R569 Unspecified convulsions: Secondary | ICD-10-CM

## 2016-10-31 MED ORDER — LACOSAMIDE 100 MG PO TABS: 1.0000 | ORAL_TABLET | Freq: Two times a day (BID) | ORAL | 3 refills | Status: DC

## 2016-10-31 MED ORDER — LACOSAMIDE 100 MG PO TABS: 1.0000 | ORAL_TABLET | Freq: Two times a day (BID) | ORAL | 0 refills | Status: DC

## 2016-10-31 NOTE — Patient Instructions (Signed)
Continue Vimpat and Keppra If your symptoms worsen or you develop new symptoms please let us know.

## 2016-10-31 NOTE — Progress Notes (Signed)
Fax confirmation received vimpat [phone].

## 2016-10-31 NOTE — Progress Notes (Signed)
Brandi [name]: Brandi Brandi [name] DOB: 1956-10-29  REASON FOR VISIT: follow up- seizures HISTORY FROM: Brandi [name]  HISTORY OF PRESENT ILLNESS: Brandi Brandi [name] is a 60 year old female with a history of seizures. She returns today for follow-up. She is currently on Vimpat and Keppra. She denies any seizure activity. She is able to complete all ADLs independently. 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 She is now following with a psychiatrist. She returns today for an evaluation.  HISTORY Brandi Brandi [name] is a 60 year old female with a history of seizures. She returns today for a follow-up visit. She continues on Keppra and Vimpat. She states that she tolerates these medications well. She denies any seizure events. She is able to complete all ADLs independent. She operates a Teacher, music without difficulty. Denies any changes with her gait or balance. Denies any changes with her mood or behavior. She states that she recently had cataract surgery. Denies any new neurological issues. She returns today for an evaluation.  HISTORY 11/02/14 (Brandi [name]):  Brandi Brandi [name] is a 60 year old right-handed white female with a history of seizures. Brandi Brandi [name] has in Brandi past had a very active EEG study with brief electroconvulsive seizures that are associated with eye blinking. Brandi Brandi [name] has had only one recent generalized convulsive seizure that occurred in July 2010. She is on Keppra and Vimpat, and she is tolerating these medications well. Brandi Brandi [name] finds that Brandi Vimpat is difficult to afford, however. 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 She has had no new significant medical issues that have come up recently. Brandi Brandi [name] returns for an evaluation. She was last seen through this office around 06/05/2012.   REVIEW OF SYSTEMS: Out of a complete 14 system review of symptoms, Brandi Brandi [name] complains only of Brandi following symptoms, and all other reviewed systems are  negative.  Abdominal pain, back pain, aching muscles, neck stiffness, headache, numbness, facial swelling, activity change  ALLERGIES: Allergies  Allergen Reactions  . Penicillins Rash    HOME MEDICATIONS: Outpatient Medications Prior to Visit  Medication Sig Dispense Refill  . Ascorbic Acid (VITAMIN C) 1000 MG tablet Take 2,000 mg by mouth daily.    Marland Kitchen aspirin EC 81 MG tablet Take 81 mg by mouth daily.    Marland Kitchen atorvastatin (LIPITOR) 20 MG tablet TAKE 1 TABLET (20 MG TOTAL) BY MOUTH DAILY. 90 tablet 3  . busPIRone (BUSPAR) 30 MG tablet Take 30 mg by mouth 2 (two) times daily.    . calcium-vitamin D (OSCAL WITH D) 250-125 MG-UNIT per tablet Take 2 tablets by mouth daily. 30 tablet 11  . fish oil-omega-3 fatty acids 1000 MG capsule Take 2 g by mouth daily.     . Garlic 123XX123 MG CAPS Take 1,000 mg by mouth daily.     Marland Kitchen glucose blood (ONETOUCH VERIO) test strip Use 1 strip daily to test blood sugars. Dx code E11.9 100 each 1  . LATUDA 120 MG TABS Take 1 tablet by mouth daily.  3  . levETIRAcetam (KEPPRA) 500 MG tablet TAKE 2 TABLETS (1,000 MG TOTAL) BY MOUTH 2 (TWO) TIMES DAILY. 360 tablet 3  . lisinopril (PRINIVIL,ZESTRIL) 5 MG tablet TAKE 1 TABLET (5 MG TOTAL) BY MOUTH DAILY. 90 tablet 3  . loratadine (CLARITIN) 10 MG tablet Take 10 mg by mouth daily.     Marland Kitchen LORazepam (ATIVAN) 0.5 MG tablet Take 0.5 mg by mouth every evening.     . magnesium 30 MG tablet Take 30 mg by  mouth 2 (two) times daily.     . meclizine (ANTIVERT) 12.5 MG tablet Take 1 tablet (12.5 mg total) by mouth 2 (two) times daily as needed for dizziness. 30 tablet 0  . Multiple Vitamins-Minerals (MULTIVITAMIN WITH MINERALS) tablet Take 1 tablet by mouth daily.     . pantoprazole (PROTONIX) 40 MG tablet TAKE 1 TABLET (40 MG TOTAL) BY MOUTH DAILY. 30 tablet 11  . Riboflavin (VITAMIN B-2 PO) Take 100 mg by mouth 2 (two) times daily.    Marland Kitchen venlafaxine XR (EFFEXOR-XR) 150 MG 24 hr capsule Take 150 mg by mouth 2 (two) times daily.  11   . VIMPAT 100 MG TABS TAKE 1 TABLET BY MOUTH TWICE A DAY 180 tablet 0   No facility-administered medications prior to visit.     PAST MEDICAL HISTORY: Past Medical History:  Diagnosis Date  . Allergic rhinitis   . Anemia   . Arthritis HX RIGHT ANKLE FX  . Cancer [facility])    breast, lumpectomy  . Depression    MAJOR  . Dyslipidemia   . GERD (gastroesophageal reflux disease)   . History of breast cancer DX 2010--  S/P LUMPECTOMY AND RADIATION -- NO RECURRENCE   ONCOLOGIST- DR Brandi Brandi [name]  . Hypertension   . Left flank pain   . Left ureteral calculus   . Migraines   . Schizophrenic disorder ([place])   . 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 Type II or unspecified type diabetes mellitus without mention of complication, not stated as uncontrolled 03/16/2014    PAST SURGICAL HISTORY: Past Surgical History:  Procedure Laterality Date  . BREAST SURGERY  01-04-2009  DR Brandi Brandi [name]   LEFT BREAST LUMPECTOMY W/ SLN DISSECTION  FOR CANCER  . CESAREAN SECTION  1998  . CHILD BIRTH     C-SECTION  . CYSTOSCOPY WITH RETROGRADE PYELOGRAM, URETEROSCOPY AND STENT PLACEMENT  11/10/2012   Procedure: CYSTOSCOPY WITH RETROGRADE PYELOGRAM, URETEROSCOPY AND STENT PLACEMENT;  Surgeon: Brandi Ben, MD;  Location: [place];  Service: Urology;  Laterality: Left;  WITH DOUBLE J STENT  . HOLMIUM LASER APPLICATION  [id number]   Procedure: HOLMIUM LASER APPLICATION;  Surgeon: Brandi Ben, MD;  Location: [place];  Service: Urology;  Laterality: Left;  . LAPAROSCOPIC CHOLECYSTECTOMY  1990'S    FAMILY HISTORY: Family History  Problem Relation Age of Onset  . Cancer Sister     breast  . Cancer Mother     breast  . Cancer Father     lung  . Mental illness Maternal Uncle   . Cancer Maternal Grandmother   . Stroke Maternal Grandmother   . Tuberculosis Maternal Grandfather   . Cancer Paternal Grandmother   . Seizures Neg Hx     SOCIAL  HISTORY: Social History   Social History  . Marital status: Married    Spouse name: N/A  . Number of children: N/A  . Years of education: N/A   Occupational History  . Not on file.   Social History Main Topics  . Smoking status: Never Smoker  . Smokeless tobacco: Never Used  . Alcohol use No  . Drug use: No  . Sexual activity: Not Currently   Other Topics Concern  . Not on file   Social History Narrative  . No narrative on file      PHYSICAL EXAM  Vitals:   10/31/16 1418  BP: (!) 137/95  Pulse: (!) 105  Weight: 196 lb 6.4 oz (  89.1 kg)  Height: 5\' 1"  (1.549 m)   Body mass index is 37.11 kg/m.  Generalized: Well developed, in no acute distress   Neurological examination  Mentation: Alert oriented to time, place, history taking. Follows all commands speech and language fluent Cranial nerve II-XII: Pupils were equal round reactive to light. Extraocular movements were full, visual field were full on confrontational test. Facial sensation and strength were normal. Uvula tongue midline. Head turning and shoulder shrug  were normal and symmetric. Motor: Brandi motor testing reveals 5 over 5 strength of all 4 extremities. Good symmetric motor tone is noted throughout.  Sensory: Sensory testing is intact to soft touch on all 4 extremities. No evidence of extinction is noted.  Coordination: Cerebellar testing reveals good finger-nose-finger and heel-to-shin bilaterally.  Gait and station: Gait is normal. Tandem gait is unsteady. Romberg is negative. No drift is seen.  Reflexes: Deep tendon reflexes are symmetric and normal bilaterally.   DIAGNOSTIC DATA (LABS, IMAGING, TESTING) - I reviewed Brandi [name] records, labs, notes, testing and imaging myself where available.  Lab Results  Component Value Date   WBC 10.4 11/13/2015   HGB 12.5 11/13/2015   HCT 39.9 11/13/2015   MCV 76.9 (L) 11/13/2015   PLT 580 (H) 11/13/2015      Component Value Date/Time   NA 141 11/13/2015 1029    NA 144 10/24/2014 1336   K 3.8 11/13/2015 1029   K 3.5 10/24/2014 1336   CL 101 11/13/2015 1029   CL 108 (H) 06/24/2013 0610   CL 103 10/22/2012 1343   CO2 27 11/13/2015 1029   CO2 31 (H) 10/24/2014 1336   GLUCOSE 119 (H) 11/13/2015 1029   GLUCOSE 116 10/24/2014 1336   GLUCOSE 153 (H) 10/22/2012 1343   BUN 15 11/13/2015 1029   BUN 14.9 10/24/2014 1336   CREATININE 1.08 (H) 11/13/2015 1029   CREATININE 0.8 10/24/2014 1336   CALCIUM 10.0 11/13/2015 1029   CALCIUM 10.7 (H) 10/24/2014 1336   PROT 7.4 11/13/2015 1029   PROT 7.6 10/24/2014 1336   ALBUMIN 4.8 11/13/2015 1029   ALBUMIN 4.1 10/24/2014 1336   AST 22 11/13/2015 1029   AST 21 10/24/2014 1336   ALT 28 11/13/2015 1029   ALT 31 10/24/2014 1336   ALKPHOS 152 (H) 11/13/2015 1029   ALKPHOS 138 10/24/2014 1336   BILITOT 0.5 11/13/2015 1029   BILITOT 0.44 10/24/2014 1336   GFRNONAA 72 (L) 06/30/2013 0500   GFRNONAA >60 06/24/2013 0610   GFRAA 83 (L) 06/30/2013 0500   GFRAA >60 06/24/2013 0610   Lab Results  Component Value Date   CHOL 161 11/13/2015   HDL 67 11/13/2015   LDLCALC 44 11/13/2015   TRIG 250 (H) 11/13/2015   CHOLHDL 2.4 11/13/2015       ASSESSMENT AND PLAN 60 y.o. 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 here with:  1. Seizures  Overall Brandi Brandi [name] is doing well. She will continue on Vimpat and Keppra. Denies any new neurological symptoms. Will return in one year or sooner if needed.     Jinny Blossom  Clabe Seal, MSN, NP-C 10/31/2016, 2:27 PM [facility] [address] [place], [place] [address] [phone]

## 2016-10-31 NOTE — Progress Notes (Signed)
I have read the note, and I agree with the clinical assessment and plan.  Michalle Rademaker KEITH   

## 2016-11-11 DIAGNOSIS — F323 Major depressive disorder, single episode, severe with psychotic features: Secondary | ICD-10-CM | POA: Diagnosis not present

## 2016-11-12 ENCOUNTER — Ambulatory Visit (INDEPENDENT_AMBULATORY_CARE_PROVIDER_SITE_OTHER): Payer: Medicare Other | Admitting: Family Medicine

## 2016-11-12 ENCOUNTER — Encounter: Payer: Self-pay | Admitting: Family Medicine

## 2016-11-12 VITALS — BP 112/72 | HR 82 | Ht 61.0 in | Wt 194.0 lb

## 2016-11-12 DIAGNOSIS — E118 Type 2 diabetes mellitus with unspecified complications: Secondary | ICD-10-CM

## 2016-11-12 DIAGNOSIS — E1169 Type 2 diabetes mellitus with other specified complication: Secondary | ICD-10-CM

## 2016-11-12 DIAGNOSIS — E785 Hyperlipidemia, unspecified: Secondary | ICD-10-CM

## 2016-11-12 DIAGNOSIS — Z79899 Other long term (current) drug therapy: Secondary | ICD-10-CM

## 2016-11-12 DIAGNOSIS — E669 Obesity, unspecified: Secondary | ICD-10-CM

## 2016-11-12 LAB — POCT UA - MICROALBUMIN
Albumin/Creatinine Ratio, Urine, POC: 16.1
CREATININE, POC: 156.5 mg/dL
MICROALBUMIN (UR) POC: 25.2 mg/L

## 2016-11-12 LAB — COMPREHENSIVE METABOLIC PANEL
ALT: 39 U/L — ABNORMAL HIGH (ref 6–29)
AST: 29 U/L (ref 10–35)
Albumin: 4.5 g/dL (ref 3.6–5.1)
Alkaline Phosphatase: 133 U/L — ABNORMAL HIGH (ref 33–130)
BUN: 12 mg/dL (ref 7–25)
CHLORIDE: 99 mmol/L (ref 98–110)
CO2: 28 mmol/L (ref 20–31)
Calcium: 10.2 mg/dL (ref 8.6–10.4)
Creat: 0.89 mg/dL (ref 0.50–0.99)
GLUCOSE: 151 mg/dL — AB (ref 65–99)
POTASSIUM: 3.7 mmol/L (ref 3.5–5.3)
Sodium: 139 mmol/L (ref 135–146)
Total Bilirubin: 0.6 mg/dL (ref 0.2–1.2)
Total Protein: 7.2 g/dL (ref 6.1–8.1)

## 2016-11-12 LAB — CBC WITH DIFFERENTIAL/PLATELET
BASOS ABS: 0 {cells}/uL (ref 0–200)
BASOS PCT: 0 %
EOS ABS: 303 {cells}/uL (ref 15–500)
Eosinophils Relative: 3 %
HCT: 42.2 % (ref 35.0–45.0)
Hemoglobin: 13.8 g/dL (ref 11.7–15.5)
LYMPHS PCT: 25 %
Lymphs Abs: 2525 cells/uL (ref 850–3900)
MCH: 26.6 pg — AB (ref 27.0–33.0)
MCHC: 32.7 g/dL (ref 32.0–36.0)
MCV: 81.3 fL (ref 80.0–100.0)
MONOS PCT: 9 %
MPV: 9.1 fL (ref 7.5–12.5)
Monocytes Absolute: 909 cells/uL (ref 200–950)
NEUTROS PCT: 63 %
Neutro Abs: 6363 cells/uL (ref 1500–7800)
PLATELETS: 421 10*3/uL — AB (ref 140–400)
RBC: 5.19 MIL/uL — ABNORMAL HIGH (ref 3.80–5.10)
RDW: 15.7 % — AB (ref 11.0–15.0)
WBC: 10.1 10*3/uL (ref 4.0–10.5)

## 2016-11-12 LAB — LIPID PANEL
CHOL/HDL RATIO: 2.7 ratio (ref <5.0)
Cholesterol: 160 mg/dL (ref <200)
HDL: 59 mg/dL (ref >50)
LDL Cholesterol: 43 mg/dL (ref <100)
Triglycerides: 291 mg/dL — ABNORMAL HIGH (ref <150)
VLDL: 58 mg/dL — AB (ref <30)

## 2016-11-12 LAB — POCT GLYCOSYLATED HEMOGLOBIN (HGB A1C): Hemoglobin A1C: 7.1

## 2016-11-12 NOTE — Patient Instructions (Signed)
20 minutes everyday of something physical

## 2016-11-12 NOTE — Progress Notes (Signed)
  Subjective:    Patient ID: Brandi [name], female    DOB: 01-03-1956, 60 y.o.   MRN: [id number]  Brandi [name] is a 60 y.o. female who presents for follow-up of Type 2 diabetes mellitus.  Patient is checking home blood sugars.   Home blood sugar records: avg.120 How often is blood sugars being checked: once daily Current symptoms/problems none Daily foot checks: yes   Any foot concerns: none Last eye exam: 01/2017 Exercise: none She continues on low-dose lisinopril. Her blood pressure has always remained in a good range. She also is on statin and having no trouble with that. She continues to be followed by psychiatry for her underlying depression. She does take OTC vitamin preparations. The following portions of the patient's history were reviewed and updated as appropriate: allergies, current medications, past medical history, past social history and problem list.  ROS as in subjective above.     Objective:    Physical Exam Alert and in no distress otherwise not examined. Hemoglobin A1c is 7.1  Lab Review Diabetic Labs Latest Ref Rng & Units 07/09/2016 03/01/2016 11/13/2015 06/27/2015 10/24/2014  HbA1c - 7.0 6.8 6.9 6.8(H) -  Microalbumin mg/L - - 176.2 - -  Micro/Creat Ratio - - - 69.5 - -  Chol 125 - 200 mg/dL - - 161 - -  HDL >=46 mg/dL - - 67 - -  Calc LDL <130 mg/dL - - 44 - -  Triglycerides <150 mg/dL - - 250(H) - -  Creatinine 0.50 - 1.05 mg/dL - - 1.08(H) - 0.8   BP/Weight 10/31/2016 09/24/2016 07/18/2016 07/09/2016 [id number]  Systolic BP 0000000 [id number] [id number]  Diastolic BP 95 80 90 74 [id number]  Wt. (Lbs) 196.4 195 199 200 198  BMI 37.11 36.84 37.6 37.79 37.43   Foot/eye exam completion dates Latest Ref Rng & Units 11/13/2015 10/02/2015  Eye Exam No Retinopathy - No Retinopathy  Foot Form Completion - Done -    Junie  reports that she has never smoked. She has never used smokeless tobacco. She reports that she does not drink alcohol or use drugs.     Assessment &  Plan:    Type 2 diabetes mellitus with complication, without long-term current use of insulin (HCC) - Plan: HgB A1c, CBC with Differential/Platelet, Comprehensive metabolic panel, Lipid panel, POCT UA - Microalbumin  Hyperlipidemia associated with type 2 diabetes mellitus ([place]) - Plan: Lipid panel  Obesity (BMI 30-39.9) - Plan: CBC with Differential/Platelet, Comprehensive metabolic panel, Lipid panel  Encounter for long-term (current) use of medications - Plan: CBC with Differential/Platelet, Comprehensive metabolic panel, Lipid panel, POCT UA - Microalbumin   1. Rx changes: none 2. Education: Reviewed 'ABCs' of diabetes management (respective goals in parentheses):  A1C (<7), blood pressure (<130/80), and cholesterol (LDL <100). 3. Compliance at present is estimated to be fair. Efforts to improve compliance (if necessary) will be directed at increased exercise. I recommended 20 minutes of something physical daily. 4. Follow up: 4 months Recommended she talk to pharmacist about getting the tetanus shot as well as heading up for Pap with her next visit here.

## 2016-11-23 DIAGNOSIS — J020 Streptococcal pharyngitis: Secondary | ICD-10-CM | POA: Diagnosis not present

## 2016-12-03 ENCOUNTER — Ambulatory Visit (INDEPENDENT_AMBULATORY_CARE_PROVIDER_SITE_OTHER): Payer: Medicare Other | Admitting: Family Medicine

## 2016-12-03 ENCOUNTER — Encounter: Payer: Self-pay | Admitting: Family Medicine

## 2016-12-03 VITALS — BP 110/70 | HR 127 | Temp 99.6°F | Resp 16 | Wt 190.6 lb

## 2016-12-03 DIAGNOSIS — R112 Nausea with vomiting, unspecified: Secondary | ICD-10-CM | POA: Diagnosis not present

## 2016-12-03 DIAGNOSIS — R197 Diarrhea, unspecified: Secondary | ICD-10-CM

## 2016-12-03 DIAGNOSIS — R61 Generalized hyperhidrosis: Secondary | ICD-10-CM | POA: Diagnosis not present

## 2016-12-03 LAB — GLUCOSE, POCT (MANUAL RESULT ENTRY): POC Glucose: 207 mg/dl — AB (ref 70–99)

## 2016-12-03 MED ORDER — ONDANSETRON 4 MG PO TBDP
4.0000 mg | ORAL_TABLET | Freq: Three times a day (TID) | ORAL | 0 refills | Status: DC | PRN
Start: 2016-12-03 — End: 2017-07-15

## 2016-12-03 NOTE — Progress Notes (Signed)
Subjective:    Patient ID: Brandi [name], female    DOB: 1956/05/28, 60 y.o.   MRN: [id number]  HPI Chief Complaint  Patient presents with  . throwing up    throwing up yesterday and diarrhea. none since 3 or 4am, low grade fever   She is here with her husband with complaints of a one day history of nausea, vomiting and diarrhea. Reports having a low grade temperature at home. States she had 3-4 episodes of vomiting and 3 episodes of diarrhea yesterday. No vomiting or diarrhea today so far.  Denies any blood or pus in stool. States she was unable to take her medication yesterday due to illness.  States blood sugar was in the 200 range yesterday.  No solid food in the past 36 hours per husband but has been keeping down fluids this morning. Her last meal was Sunday evening and was at a restaurant. states another member of the family ate at the same restaurant and was also experiencing vomiting and diarrhea. Husband states she may have food poisoning.   Denies chills, body aches, headache, vision changes, dizziness, slurred speech, focal weakness, chest pain, palpitations, shortness of breath, back pain, abdominal pain, vaginal discharge or urinary symptoms. She is able to urinate and no discomfort.   She is not very communicative and her husband is doing most of the talking. She does answer specific questions. He states she is behaving normally.   Reviewed allergies, medications, past medical, surgical,  and social history.  Past Medical History:  Diagnosis Date  . Allergic rhinitis   . Anemia   . Arthritis HX RIGHT ANKLE FX  . Cancer [facility])    breast, lumpectomy  . Depression    MAJOR  . Dyslipidemia   . GERD (gastroesophageal reflux disease)   . History of breast cancer DX 2010--  S/P LUMPECTOMY AND RADIATION -- NO RECURRENCE   ONCOLOGIST- DR Truddie Coco  . Hypertension   . Left flank pain   . Left ureteral calculus   . Migraines   . Schizophrenic disorder ([place])   . Tonic-clonic  seizure disorder ([place]) X1  2010--  NO SEIZURE SINCE  . Type II or unspecified type diabetes mellitus without mention of complication, not stated as uncontrolled 03/16/2014       Review of Systems Pertinent positives and negatives in the history of present illness.     Objective:   Physical Exam  Constitutional: She is oriented to person, place, and time. She appears well-developed and well-nourished. She has a sickly appearance. No distress.  HENT:  Right Ear: Tympanic membrane and ear canal normal.  Left Ear: Tympanic membrane and ear canal normal.  Nose: Nose normal. Right sinus exhibits no maxillary sinus tenderness and no frontal sinus tenderness. Left sinus exhibits no maxillary sinus tenderness and no frontal sinus tenderness.  Mouth/Throat: Uvula is midline and oropharynx is clear and moist. Mucous membranes are dry.  Eyes: Conjunctivae are normal. Pupils are equal, round, and reactive to light.  Neck: Full passive range of motion without pain. Neck supple. No JVD present.  Cardiovascular: Regular rhythm, normal heart sounds and intact distal pulses.  Tachycardia present.  Exam reveals no gallop and no friction rub.   No murmur heard. Cap refill <2 seconds  Pulmonary/Chest: Effort normal and breath sounds normal. She exhibits no tenderness.  Abdominal: Soft. Normal appearance. She exhibits no pulsatile midline mass. Bowel sounds are decreased. There is no hepatosplenomegaly. There is no tenderness. There is no rigidity, no  rebound, no guarding, no CVA tenderness, no tenderness at McBurney's point and negative Murphy's sign.  Lymphadenopathy:    She has no cervical adenopathy.       Right: No supraclavicular adenopathy present.       Left: No supraclavicular adenopathy present.  Neurological: She is alert and oriented to person, place, and time. She has normal strength. She displays no tremor. No cranial nerve deficit or sensory deficit. Coordination and gait normal.  Skin: Skin is  warm. No rash noted. She is diaphoretic. No pallor.  Psychiatric: She has a normal mood and affect. She is withdrawn.  Baseline per husband. Speech normal but she is not forthcoming with information. Delayed responses.    BP 110/70   Pulse (!) 127   Temp 99.6 F (37.6 C) (Oral)   Resp 16   Wt 190 lb 9.6 oz (86.5 kg)   SpO2 95%   BMI 36.01 kg/m   PCOT glucose 207     Assessment & Plan:  Non-intractable vomiting with nausea, unspecified vomiting type - Plan: ondansetron (ZOFRAN-ODT) 4 MG disintegrating tablet  Diaphoresis - Plan: POCT glucose (manual entry)  Diarrhea, unspecified type  Discussed that she does appear somewhat dehydrated and recommend taking Zofran and hydrating with clear fluids over the next 24 hours. Zofran sent to pharmacy. Suspect she will be able to tolerate fluids since she has not vomited in the past several hours. Recommend that she take her medication when she gets home and keep an eye on her blood sugar. She does not appear infectious or toxic. Abdominal exam is unremarkable. Strongly advised that if she has uncontrolled vomiting and is unable to keep down fluids or if she develops pain or any other worrisome symptoms that she go to the emergency department for further evaluation and rehydration. Patient and husband verbalized understanding. They will call me tomorrow with a report on how she is feeling.

## 2016-12-03 NOTE — Patient Instructions (Addendum)
Take the Zofran for nausea and try to push fluids today. Stay with mostly clear fluids for the next 24 hours.  If you develop chest pain, abdominal pain or are unable to keep fluids down and then you will need to go to the emergency department for further evaluation and possible IV fluids.  Follow up tomorrow, call me and let me know how you are doing.

## 2016-12-04 ENCOUNTER — Emergency Department (HOSPITAL_COMMUNITY): Payer: Medicare Other

## 2016-12-04 ENCOUNTER — Inpatient Hospital Stay (HOSPITAL_COMMUNITY)
Admission: EM | Admit: 2016-12-04 | Discharge: 2016-12-08 | DRG: 872 | Disposition: A | Discharge status: Home Health | Payer: Medicare Other | Attending: Internal Medicine | Admitting: Internal Medicine

## 2016-12-04 ENCOUNTER — Encounter (HOSPITAL_COMMUNITY): Payer: Self-pay | Admitting: Vascular Surgery

## 2016-12-04 DIAGNOSIS — A4181 Sepsis due to Enterococcus: Secondary | ICD-10-CM | POA: Diagnosis not present

## 2016-12-04 DIAGNOSIS — E119 Type 2 diabetes mellitus without complications: Secondary | ICD-10-CM

## 2016-12-04 DIAGNOSIS — K219 Gastro-esophageal reflux disease without esophagitis: Secondary | ICD-10-CM | POA: Diagnosis present

## 2016-12-04 DIAGNOSIS — R569 Unspecified convulsions: Secondary | ICD-10-CM

## 2016-12-04 DIAGNOSIS — E1159 Type 2 diabetes mellitus with other circulatory complications: Secondary | ICD-10-CM | POA: Diagnosis present

## 2016-12-04 DIAGNOSIS — E876 Hypokalemia: Secondary | ICD-10-CM | POA: Diagnosis present

## 2016-12-04 DIAGNOSIS — D649 Anemia, unspecified: Secondary | ICD-10-CM | POA: Diagnosis present

## 2016-12-04 DIAGNOSIS — K5730 Diverticulosis of large intestine without perforation or abscess without bleeding: Secondary | ICD-10-CM | POA: Diagnosis not present

## 2016-12-04 DIAGNOSIS — R6520 Severe sepsis without septic shock: Secondary | ICD-10-CM | POA: Diagnosis present

## 2016-12-04 DIAGNOSIS — N390 Urinary tract infection, site not specified: Secondary | ICD-10-CM | POA: Diagnosis present

## 2016-12-04 DIAGNOSIS — E861 Hypovolemia: Secondary | ICD-10-CM | POA: Diagnosis present

## 2016-12-04 DIAGNOSIS — N179 Acute kidney failure, unspecified: Secondary | ICD-10-CM | POA: Diagnosis present

## 2016-12-04 DIAGNOSIS — K529 Noninfective gastroenteritis and colitis, unspecified: Secondary | ICD-10-CM | POA: Diagnosis not present

## 2016-12-04 DIAGNOSIS — E1165 Type 2 diabetes mellitus with hyperglycemia: Secondary | ICD-10-CM | POA: Diagnosis present

## 2016-12-04 DIAGNOSIS — G40409 Other generalized epilepsy and epileptic syndromes, not intractable, without status epilepticus: Secondary | ICD-10-CM | POA: Diagnosis present

## 2016-12-04 DIAGNOSIS — Z7982 Long term (current) use of aspirin: Secondary | ICD-10-CM

## 2016-12-04 DIAGNOSIS — I10 Essential (primary) hypertension: Secondary | ICD-10-CM | POA: Diagnosis present

## 2016-12-04 DIAGNOSIS — Z79899 Other long term (current) drug therapy: Secondary | ICD-10-CM

## 2016-12-04 DIAGNOSIS — F329 Major depressive disorder, single episode, unspecified: Secondary | ICD-10-CM | POA: Diagnosis present

## 2016-12-04 DIAGNOSIS — E860 Dehydration: Secondary | ICD-10-CM | POA: Diagnosis present

## 2016-12-04 DIAGNOSIS — R7989 Other specified abnormal findings of blood chemistry: Secondary | ICD-10-CM | POA: Diagnosis present

## 2016-12-04 DIAGNOSIS — E871 Hypo-osmolality and hyponatremia: Secondary | ICD-10-CM | POA: Diagnosis present

## 2016-12-04 DIAGNOSIS — A419 Sepsis, unspecified organism: Secondary | ICD-10-CM | POA: Diagnosis present

## 2016-12-04 DIAGNOSIS — R778 Other specified abnormalities of plasma proteins: Secondary | ICD-10-CM | POA: Diagnosis present

## 2016-12-04 DIAGNOSIS — Z880 Allergy status to penicillin: Secondary | ICD-10-CM

## 2016-12-04 DIAGNOSIS — E669 Obesity, unspecified: Secondary | ICD-10-CM | POA: Diagnosis present

## 2016-12-04 DIAGNOSIS — E785 Hyperlipidemia, unspecified: Secondary | ICD-10-CM | POA: Diagnosis present

## 2016-12-04 DIAGNOSIS — Z87442 Personal history of urinary calculi: Secondary | ICD-10-CM

## 2016-12-04 DIAGNOSIS — Z853 Personal history of malignant neoplasm of breast: Secondary | ICD-10-CM

## 2016-12-04 DIAGNOSIS — Z6835 Body mass index (BMI) 35.0-35.9, adult: Secondary | ICD-10-CM

## 2016-12-04 LAB — COMPREHENSIVE METABOLIC PANEL
ALBUMIN: 3.7 g/dL (ref 3.5–5.0)
ALT: 49 U/L (ref 14–54)
ANION GAP: 11 (ref 5–15)
AST: 54 U/L — AB (ref 15–41)
Alkaline Phosphatase: 91 U/L (ref 38–126)
BUN: 64 mg/dL — ABNORMAL HIGH (ref 6–20)
CHLORIDE: 97 mmol/L — AB (ref 101–111)
CO2: 25 mmol/L (ref 22–32)
Calcium: 9.2 mg/dL (ref 8.9–10.3)
Creatinine, Ser: 1.16 mg/dL — ABNORMAL HIGH (ref 0.44–1.00)
GFR calc Af Amer: 58 mL/min — ABNORMAL LOW (ref >60)
GFR calc non Af Amer: 50 mL/min — ABNORMAL LOW (ref >60)
Glucose, Bld: 219 mg/dL — ABNORMAL HIGH (ref 65–99)
POTASSIUM: 3 mmol/L — AB (ref 3.5–5.1)
SODIUM: 133 mmol/L — AB (ref 135–145)
TOTAL PROTEIN: 6.6 g/dL (ref 6.5–8.1)
Total Bilirubin: 0.6 mg/dL (ref 0.3–1.2)

## 2016-12-04 LAB — CBC
HEMATOCRIT: 35.6 % — AB (ref 36.0–46.0)
HEMOGLOBIN: 11.4 g/dL — AB (ref 12.0–15.0)
MCH: 25.9 pg — ABNORMAL LOW (ref 26.0–34.0)
MCHC: 32 g/dL (ref 30.0–36.0)
MCV: 80.7 fL (ref 78.0–100.0)
Platelets: 488 10*3/uL — ABNORMAL HIGH (ref 150–400)
RBC: 4.41 MIL/uL (ref 3.87–5.11)
RDW: 15.4 % (ref 11.5–15.5)
WBC: 29.5 10*3/uL — ABNORMAL HIGH (ref 4.0–10.5)

## 2016-12-04 LAB — LIPASE, BLOOD: Lipase: 11 U/L (ref 11–51)

## 2016-12-04 LAB — I-STAT CG4 LACTIC ACID, ED: Lactic Acid, Venous: 2.27 mmol/L (ref 0.5–1.9)

## 2016-12-04 MED ORDER — SODIUM CHLORIDE 0.9 % IV SOLN
1.0000 g | Freq: Three times a day (TID) | INTRAVENOUS | Status: DC
  Administered 2016-12-04: 1 g via INTRAVENOUS
  Filled 2016-12-04 (×2): qty 1

## 2016-12-04 MED ORDER — SODIUM CHLORIDE 0.9 % IV BOLUS (SEPSIS)
1000.0000 mL | Freq: Once | INTRAVENOUS | Status: AC
  Administered 2016-12-04: 1000 mL via INTRAVENOUS

## 2016-12-04 MED ORDER — IOPAMIDOL (ISOVUE-300) INJECTION 61%
INTRAVENOUS | Status: AC
Start: 2016-12-04 — End: 2016-12-04
  Administered 2016-12-04: 100 mL
  Filled 2016-12-04: qty 100

## 2016-12-04 MED ORDER — SODIUM CHLORIDE 0.9 % IV BOLUS (SEPSIS)
1000.0000 mL | Freq: Once | INTRAVENOUS | Status: AC
  Administered 2016-12-05: 1000 mL via INTRAVENOUS

## 2016-12-04 MED ORDER — LEVOFLOXACIN IN D5W 750 MG/150ML IV SOLN: 750.0000 mg | Freq: Once | INTRAVENOUS | Status: DC

## 2016-12-04 NOTE — ED Triage Notes (Signed)
Pt reports to the ED for eval of N/V/D. She was seen on Monday 12/19 and given a rx for Zofran and told to f/u if her symptoms did not improve. She has not had significant improvement in her symptoms. Denies any abd pain, urinary symptoms, CP, or SOB.

## 2016-12-04 NOTE — Progress Notes (Addendum)
Pharmacy Antibiotic Note  Brandi [name] is a 60 y.o. female admitted on 12/04/2016 with intra-abdominal infection.  Pharmacy has been consulted for merrem dosing.  Plan:  Merrem 1g IV q8h Monitor culture data, renal function and clinical course  Height: 5\' 1"  (154.9 cm) Weight: 190 lb (86.2 kg) IBW/kg (Calculated) : 47.8  Temp (24hrs), Avg:98 F (36.7 C), Min:98 F (36.7 C), Max:98 F (36.7 C)   Recent Labs Lab 12/04/16 1944  WBC 29.5*  CREATININE 1.16*    Estimated Creatinine Clearance: 50.8 mL/min (by C-G formula based on SCr of 1.16 mg/dL (H)).    Allergies  Allergen Reactions  . Penicillins Rash     Andrey Cota. Diona Foley, PharmD, [place] Clinical Pharmacist Pager [phone] 12/04/2016 10:34 PM

## 2016-12-04 NOTE — ED Provider Notes (Signed)
[facility] DEPT Provider Note   CSN: [id number] Arrival date & time: 12/04/16  1910     History   Chief Complaint Chief Complaint  Patient presents with  . Emesis    HPI Brandi [name] is a 60 y.o. female.  Patient is a 60 year old female with past medical history of diabetes, seizures, migraines, high cholesterol. She presents for evaluation of nausea, vomiting, diarrhea that is worsened over the past 3 days. She was seen by her primary doctor yesterday, however has not improved. She reports weakness and dizziness. Her husband was recently ill with similar complaints. She denies any fevers or chills.   The history is provided by the patient.  Emesis   This is a new problem. The current episode started 2 days ago. The problem occurs continuously. The problem has been gradually worsening. The emesis has an appearance of stomach contents. There has been no fever. Associated symptoms include chills. Pertinent negatives include no abdominal pain, no diarrhea and no fever.    Past Medical History:  Diagnosis Date  . Allergic rhinitis   . Anemia   . Arthritis HX RIGHT ANKLE FX  . Cancer [facility])    breast, lumpectomy  . Depression    MAJOR  . Dyslipidemia   . GERD (gastroesophageal reflux disease)   . History of breast cancer DX 2010--  S/P LUMPECTOMY AND RADIATION -- NO RECURRENCE   ONCOLOGIST- DR Truddie Coco  . Hypertension   . Left flank pain   . Left ureteral calculus   . Migraines   . Schizophrenic disorder ([place])   . 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 Type II or unspecified type diabetes mellitus without mention of complication, not stated as uncontrolled 03/16/2014    Patient Active Problem List   Diagnosis Date Noted  . Convulsions/seizures ([place]) 11/02/2014  . Diabetes mellitus type 2 with complications ([place]) 123XX123  . Thrombocytosis ([place]) 06/29/2013  . Major depression 09/10/2011  . Migraine headache 09/10/2011  . Obesity (BMI  30-39.9) 09/10/2011  . GERD (gastroesophageal reflux disease) 09/10/2011  . Allergic rhinitis due to pollen 09/10/2011  . History of breast cancer in female 09/10/2011  . Hyperlipidemia associated with type 2 diabetes mellitus ([place]) 09/10/2011    Past Surgical History:  Procedure Laterality Date  . BREAST SURGERY  01-04-2009  DR Margot Chimes   LEFT BREAST LUMPECTOMY W/ SLN DISSECTION  FOR CANCER  . CATARACT EXTRACTION, BILATERAL Bilateral   . CESAREAN SECTION  1998  . CHILD BIRTH     C-SECTION  . CYSTOSCOPY WITH RETROGRADE PYELOGRAM, URETEROSCOPY AND STENT PLACEMENT  11/10/2012   Procedure: CYSTOSCOPY WITH RETROGRADE PYELOGRAM, URETEROSCOPY AND STENT PLACEMENT;  Surgeon: Hanley Ben, MD;  Location: [facility];  Service: Urology;  Laterality: Left;  WITH DOUBLE J STENT  . HOLMIUM LASER APPLICATION  [id number]   Procedure: HOLMIUM LASER APPLICATION;  Surgeon: Hanley Ben, MD;  Location: [facility];  Service: Urology;  Laterality: Left;  . LAPAROSCOPIC CHOLECYSTECTOMY  1990'S    OB History    No data available       Home Medications    Prior to Admission medications   Medication Sig Start Date End Date Taking? Authorizing Provider  Ascorbic Acid (VITAMIN C) 1000 MG tablet Take 2,000 mg by mouth daily.    Historical Provider, MD  aspirin EC 81 MG tablet Take 81 mg by mouth daily.    Historical Provider, MD  atorvastatin (LIPITOR) 20 MG tablet TAKE  1 TABLET (20 MG TOTAL) BY MOUTH DAILY. 10/14/16   Denita Lung, MD  busPIRone (BUSPAR) 30 MG tablet Take 30 mg by mouth 2 (two) times daily. 11/01/14   Historical Provider, MD  calcium-vitamin D (OSCAL WITH D) 250-125 MG-UNIT per tablet Take 2 tablets by mouth daily. 05/18/14   Minette Headland, NP  fish oil-omega-3 fatty acids 1000 MG capsule Take 2 g by mouth daily.     Historical Provider, MD  Garlic 123XX123 MG CAPS Take 1,000 mg by mouth daily.     Historical Provider, MD  glucose blood (ONETOUCH  VERIO) test strip Use 1 strip daily to test blood sugars. Dx code E11.9 06/27/15   Denita Lung, MD  Lacosamide (VIMPAT) 100 MG TABS Take 1 tablet (100 mg total) by mouth 2 (two) times daily. 10/31/16   Ward Givens, NP  LATUDA 120 MG TABS Take 1 tablet by mouth daily. 10/17/15   Historical Provider, MD  levETIRAcetam (KEPPRA) 500 MG tablet TAKE 2 TABLETS (1,000 MG TOTAL) BY MOUTH 2 (TWO) TIMES DAILY. 08/26/16   Kathrynn Ducking, MD  lisinopril (PRINIVIL,ZESTRIL) 5 MG tablet TAKE 1 TABLET (5 MG TOTAL) BY MOUTH DAILY. 06/17/16   Denita Lung, MD  loratadine (CLARITIN) 10 MG tablet Take 10 mg by mouth daily.     Historical Provider, MD  LORazepam (ATIVAN) 0.5 MG tablet Take 0.5 mg by mouth every evening.     Historical Provider, MD  magnesium 30 MG tablet Take 30 mg by mouth 2 (two) times daily.     Historical Provider, MD  Multiple Vitamins-Minerals (MULTIVITAMIN WITH MINERALS) tablet Take 1 tablet by mouth daily.     Historical Provider, MD  ondansetron (ZOFRAN-ODT) 4 MG disintegrating tablet Take 1 tablet (4 mg total) by mouth every 8 (eight) hours as needed for nausea or vomiting. 12/03/16   Girtha Rm, NP  pantoprazole (PROTONIX) 40 MG tablet TAKE 1 TABLET (40 MG TOTAL) BY MOUTH DAILY. 09/05/16   Denita Lung, MD  Riboflavin (VITAMIN B-2 PO) Take 100 mg by mouth 2 (two) times daily.    Historical Provider, MD  venlafaxine XR (EFFEXOR-XR) 150 MG 24 hr capsule Take 150 mg by mouth 2 (two) times daily. 10/16/15   Historical Provider, MD    Family History Family History  Problem Relation Age of Onset  . Cancer Sister     breast  . Cancer Mother     breast  . Cancer Father     lung  . Mental illness Maternal Uncle   . Cancer Maternal Grandmother   . Stroke Maternal Grandmother   . Tuberculosis Maternal Grandfather   . Cancer Paternal Grandmother   . Seizures Neg Hx     Social History Social History  Substance Use Topics  . Smoking status: Never Smoker  . Smokeless tobacco:  Never Used  . Alcohol use No     Allergies   Penicillins   Review of Systems Review of Systems  Constitutional: Positive for chills and fatigue. Negative for fever.  Gastrointestinal: Positive for vomiting. Negative for abdominal pain and diarrhea.  All other systems reviewed and are negative.    Physical Exam Updated Vital Signs BP 130/79 (BP Location: Left Arm)   Pulse 116   Temp 98 F (36.7 C) (Oral)   Resp 19   Ht 5\' 1"  (1.549 m)   Wt 190 lb (86.2 kg)   SpO2 92%   BMI 35.90 kg/m   Physical Exam  Constitutional:  She is oriented to person, place, and time. She appears well-developed and well-nourished.  Patient appears pale and somewhat diaphoretic  HENT:  Head: Normocephalic and atraumatic.  Mucous membranes are dry.  Neck: Normal range of motion. Neck supple.  Cardiovascular: Normal rate and regular rhythm.  Exam reveals no gallop and no friction rub.   No murmur heard. Pulmonary/Chest: Effort normal and breath sounds normal. No respiratory distress. She has no wheezes.  Abdominal: Soft. Bowel sounds are normal. She exhibits no distension. There is no tenderness.  Musculoskeletal: Normal range of motion. She exhibits no edema.  Neurological: She is alert and oriented to person, place, and time.  Skin: Skin is warm and dry.  Nursing note and vitals reviewed.    ED Treatments / Results  Labs (all labs ordered are listed, but only abnormal results are displayed) Labs Reviewed  COMPREHENSIVE METABOLIC PANEL - Abnormal; Notable for the following:       Result Value   Sodium 133 (*)    Potassium 3.0 (*)    Chloride 97 (*)    Glucose, Bld 219 (*)    BUN 64 (*)    Creatinine, Ser 1.16 (*)    AST 54 (*)    GFR calc non Af Amer 50 (*)    GFR calc Af Amer 58 (*)    All other components within normal limits  CBC - Abnormal; Notable for the following:    WBC 29.5 (*)    Hemoglobin 11.4 (*)    HCT 35.6 (*)    MCH 25.9 (*)    Platelets 488 (*)    All other  components within normal limits  CULTURE, BLOOD (ROUTINE X 2)  CULTURE, BLOOD (ROUTINE X 2)  URINE CULTURE  LIPASE, BLOOD  URINALYSIS, ROUTINE W REFLEX MICROSCOPIC  I-STAT CG4 LACTIC ACID, ED    EKG  EKG Interpretation None       Radiology No results found.  Procedures Procedures (including critical care time)  Medications Ordered in ED Medications  sodium chloride 0.9 % bolus 1,000 mL (not administered)    And  sodium chloride 0.9 % bolus 1,000 mL (not administered)    And  sodium chloride 0.9 % bolus 1,000 mL (not administered)     Initial Impression / Assessment and Plan / ED Course  I have reviewed the triage vital signs and the nursing notes.  Pertinent labs & imaging results that were available during my care of the patient were reviewed by me and considered in my medical decision making (see chart for details).  Clinical Course     Patient presents here with symptoms consistent with gastroenteritis, however her laboratory studies reveal a white count of nearly 30,000 and her BUN and creatinine are elevated consistent with dehydration. She was also tachycardic upon presentation raising my concern for possible septic condition. Blood cultures were obtained, and Imipenem was given at the recommendation of pharmacy due to penicillin allergy. CT scan of the abdomen and pelvis was obtained as well showing no acute intra-abdominal process.  I feel the patient will require admission for hydration and rule out of a septic condition. I spoke with Dr. Loleta Books who agrees to accept the patient and will admit.  CRITICAL CARE Performed by: Veryl Speak Total critical care time: 30 minutes Critical care time was exclusive of separately billable procedures and treating other patients. Critical care was necessary to treat or prevent imminent or life-threatening deterioration. Critical care was time spent personally by me on the following  activities: development of treatment  plan with patient and/or surrogate as well as nursing, discussions with consultants, evaluation of patient's response to treatment, examination of patient, obtaining history from patient or surrogate, ordering and performing treatments and interventions, ordering and review of laboratory studies, ordering and review of radiographic studies, pulse oximetry and re-evaluation of patient's condition.   Final Clinical Impressions(s) / ED Diagnoses   Final diagnoses:  None    New Prescriptions New Prescriptions   No medications on file     Veryl Speak, MD 12/04/16 2356

## 2016-12-05 ENCOUNTER — Encounter (HOSPITAL_COMMUNITY): Payer: Self-pay | Admitting: Family Medicine

## 2016-12-05 DIAGNOSIS — Z6835 Body mass index (BMI) 35.0-35.9, adult: Secondary | ICD-10-CM | POA: Diagnosis not present

## 2016-12-05 DIAGNOSIS — Z880 Allergy status to penicillin: Secondary | ICD-10-CM | POA: Diagnosis not present

## 2016-12-05 DIAGNOSIS — Z79899 Other long term (current) drug therapy: Secondary | ICD-10-CM | POA: Diagnosis not present

## 2016-12-05 DIAGNOSIS — E876 Hypokalemia: Secondary | ICD-10-CM | POA: Diagnosis present

## 2016-12-05 DIAGNOSIS — A419 Sepsis, unspecified organism: Secondary | ICD-10-CM | POA: Diagnosis not present

## 2016-12-05 DIAGNOSIS — R778 Other specified abnormalities of plasma proteins: Secondary | ICD-10-CM | POA: Diagnosis present

## 2016-12-05 DIAGNOSIS — E860 Dehydration: Secondary | ICD-10-CM | POA: Diagnosis present

## 2016-12-05 DIAGNOSIS — E785 Hyperlipidemia, unspecified: Secondary | ICD-10-CM | POA: Diagnosis present

## 2016-12-05 DIAGNOSIS — Z853 Personal history of malignant neoplasm of breast: Secondary | ICD-10-CM | POA: Diagnosis not present

## 2016-12-05 DIAGNOSIS — G40409 Other generalized epilepsy and epileptic syndromes, not intractable, without status epilepticus: Secondary | ICD-10-CM | POA: Diagnosis present

## 2016-12-05 DIAGNOSIS — E1159 Type 2 diabetes mellitus with other circulatory complications: Secondary | ICD-10-CM | POA: Diagnosis present

## 2016-12-05 DIAGNOSIS — K529 Noninfective gastroenteritis and colitis, unspecified: Secondary | ICD-10-CM

## 2016-12-05 DIAGNOSIS — I10 Essential (primary) hypertension: Secondary | ICD-10-CM

## 2016-12-05 DIAGNOSIS — E669 Obesity, unspecified: Secondary | ICD-10-CM | POA: Diagnosis present

## 2016-12-05 DIAGNOSIS — N179 Acute kidney failure, unspecified: Secondary | ICD-10-CM

## 2016-12-05 DIAGNOSIS — R569 Unspecified convulsions: Secondary | ICD-10-CM | POA: Diagnosis not present

## 2016-12-05 DIAGNOSIS — R6520 Severe sepsis without septic shock: Secondary | ICD-10-CM | POA: Diagnosis present

## 2016-12-05 DIAGNOSIS — N390 Urinary tract infection, site not specified: Secondary | ICD-10-CM | POA: Diagnosis present

## 2016-12-05 DIAGNOSIS — Z7982 Long term (current) use of aspirin: Secondary | ICD-10-CM | POA: Diagnosis not present

## 2016-12-05 DIAGNOSIS — E1165 Type 2 diabetes mellitus with hyperglycemia: Secondary | ICD-10-CM | POA: Diagnosis present

## 2016-12-05 DIAGNOSIS — D649 Anemia, unspecified: Secondary | ICD-10-CM | POA: Diagnosis not present

## 2016-12-05 DIAGNOSIS — K219 Gastro-esophageal reflux disease without esophagitis: Secondary | ICD-10-CM | POA: Diagnosis present

## 2016-12-05 DIAGNOSIS — F329 Major depressive disorder, single episode, unspecified: Secondary | ICD-10-CM | POA: Diagnosis present

## 2016-12-05 DIAGNOSIS — E871 Hypo-osmolality and hyponatremia: Secondary | ICD-10-CM | POA: Diagnosis present

## 2016-12-05 DIAGNOSIS — E861 Hypovolemia: Secondary | ICD-10-CM | POA: Diagnosis present

## 2016-12-05 DIAGNOSIS — A4181 Sepsis due to Enterococcus: Secondary | ICD-10-CM | POA: Diagnosis present

## 2016-12-05 DIAGNOSIS — R7989 Other specified abnormal findings of blood chemistry: Secondary | ICD-10-CM | POA: Diagnosis present

## 2016-12-05 DIAGNOSIS — Z87442 Personal history of urinary calculi: Secondary | ICD-10-CM | POA: Diagnosis not present

## 2016-12-05 LAB — BLOOD CULTURE ID PANEL (REFLEXED)
Acinetobacter baumannii: NOT DETECTED
CANDIDA GLABRATA: NOT DETECTED
CANDIDA KRUSEI: NOT DETECTED
CANDIDA PARAPSILOSIS: NOT DETECTED
Candida albicans: NOT DETECTED
Candida tropicalis: NOT DETECTED
ENTEROCOCCUS SPECIES: NOT DETECTED
ESCHERICHIA COLI: NOT DETECTED
Enterobacter cloacae complex: NOT DETECTED
Enterobacteriaceae species: NOT DETECTED
Haemophilus influenzae: NOT DETECTED
KLEBSIELLA OXYTOCA: NOT DETECTED
KLEBSIELLA PNEUMONIAE: NOT DETECTED
Listeria monocytogenes: NOT DETECTED
Methicillin resistance: NOT DETECTED
Neisseria meningitidis: NOT DETECTED
PROTEUS SPECIES: NOT DETECTED
Pseudomonas aeruginosa: NOT DETECTED
SERRATIA MARCESCENS: NOT DETECTED
STAPHYLOCOCCUS AUREUS BCID: NOT DETECTED
STAPHYLOCOCCUS SPECIES: DETECTED — AB
Streptococcus agalactiae: NOT DETECTED
Streptococcus pneumoniae: NOT DETECTED
Streptococcus pyogenes: NOT DETECTED
Streptococcus species: NOT DETECTED

## 2016-12-05 LAB — TROPONIN I
TROPONIN I: 0.03 ng/mL — AB (ref <0.03)
Troponin I: 0.03 ng/mL (ref <0.03)
Troponin I: 0.05 ng/mL (ref <0.03)

## 2016-12-05 LAB — COMPREHENSIVE METABOLIC PANEL
ALBUMIN: 3.2 g/dL — AB (ref 3.5–5.0)
ALT: 41 U/L (ref 14–54)
AST: 44 U/L — ABNORMAL HIGH (ref 15–41)
Alkaline Phosphatase: 76 U/L (ref 38–126)
Anion gap: 9 (ref 5–15)
BUN: 46 mg/dL — AB (ref 6–20)
CHLORIDE: 102 mmol/L (ref 101–111)
CO2: 24 mmol/L (ref 22–32)
CREATININE: 0.9 mg/dL (ref 0.44–1.00)
Calcium: 8.1 mg/dL — ABNORMAL LOW (ref 8.9–10.3)
GFR calc Af Amer: >60 mL/min (ref >60)
GFR calc non Af Amer: >60 mL/min (ref >60)
Glucose, Bld: 172 mg/dL — ABNORMAL HIGH (ref 65–99)
Potassium: 3.2 mmol/L — ABNORMAL LOW (ref 3.5–5.1)
SODIUM: 135 mmol/L (ref 135–145)
Total Bilirubin: 0.5 mg/dL (ref 0.3–1.2)
Total Protein: 5.5 g/dL — ABNORMAL LOW (ref 6.5–8.1)

## 2016-12-05 LAB — MAGNESIUM: Magnesium: 2.1 mg/dL (ref 1.7–2.4)

## 2016-12-05 LAB — CBC
HCT: 29.7 % — ABNORMAL LOW (ref 36.0–46.0)
Hemoglobin: 9.8 g/dL — ABNORMAL LOW (ref 12.0–15.0)
MCH: 26.6 pg (ref 26.0–34.0)
MCHC: 33 g/dL (ref 30.0–36.0)
MCV: 80.5 fL (ref 78.0–100.0)
PLATELETS: 426 10*3/uL — AB (ref 150–400)
RBC: 3.69 MIL/uL — AB (ref 3.87–5.11)
RDW: 15.2 % (ref 11.5–15.5)
WBC: 24.5 10*3/uL — AB (ref 4.0–10.5)

## 2016-12-05 LAB — GLUCOSE, CAPILLARY
GLUCOSE-CAPILLARY: 178 mg/dL — AB (ref 65–99)
GLUCOSE-CAPILLARY: 119 mg/dL — AB (ref 65–99)
Glucose-Capillary: 146 mg/dL — ABNORMAL HIGH (ref 65–99)
Glucose-Capillary: 111 mg/dL — ABNORMAL HIGH (ref 65–99)

## 2016-12-05 LAB — URINALYSIS, ROUTINE W REFLEX MICROSCOPIC
Bilirubin Urine: NEGATIVE
Glucose, UA: NEGATIVE mg/dL
Hgb urine dipstick: NEGATIVE
KETONES UR: NEGATIVE mg/dL
Nitrite: POSITIVE — AB
PH: 6 (ref 5.0–8.0)
Protein, ur: NEGATIVE mg/dL
SPECIFIC GRAVITY, URINE: 1.041 — AB (ref 1.005–1.030)

## 2016-12-05 LAB — OSMOLALITY, URINE: Osmolality, Ur: 547 mOsm/kg (ref 300–900)

## 2016-12-05 LAB — SODIUM, URINE, RANDOM

## 2016-12-05 LAB — OSMOLALITY: Osmolality: 301 mOsm/kg — ABNORMAL HIGH (ref 275–295)

## 2016-12-05 LAB — LACTIC ACID, PLASMA
Lactic Acid, Venous: 1.8 mmol/L (ref 0.5–1.9)
Lactic Acid, Venous: 1.1 mmol/L (ref 0.5–1.9)

## 2016-12-05 MED ORDER — LACOSAMIDE 50 MG PO TABS
100.0000 mg | ORAL_TABLET | Freq: Two times a day (BID) | ORAL | Status: DC
  Administered 2016-12-05 – 2016-12-08 (×7): 100 mg via ORAL
  Filled 2016-12-05 (×7): qty 2

## 2016-12-05 MED ORDER — LORAZEPAM 0.5 MG PO TABS
0.5000 mg | ORAL_TABLET | Freq: Every evening | ORAL | Status: DC
  Administered 2016-12-05 – 2016-12-07 (×2): 0.5 mg via ORAL
  Filled 2016-12-05 (×2): qty 1

## 2016-12-05 MED ORDER — ASPIRIN EC 81 MG PO TBEC
81.0000 mg | DELAYED_RELEASE_TABLET | Freq: Every day | ORAL | Status: DC
  Administered 2016-12-05 – 2016-12-08 (×4): 81 mg via ORAL
  Filled 2016-12-05 (×4): qty 1

## 2016-12-05 MED ORDER — SODIUM CHLORIDE 0.9 % IV SOLN
INTRAVENOUS | Status: AC
  Administered 2016-12-05 (×2): via INTRAVENOUS

## 2016-12-05 MED ORDER — BUSPIRONE HCL 10 MG PO TABS
30.0000 mg | ORAL_TABLET | Freq: Two times a day (BID) | ORAL | Status: DC
Start: 2016-12-05 — End: 2016-12-08
  Administered 2016-12-05 – 2016-12-08 (×7): 30 mg via ORAL
  Filled 2016-12-05 (×3): qty 3
  Filled 2016-12-05: qty 6
  Filled 2016-12-05 (×3): qty 3

## 2016-12-05 MED ORDER — VANCOMYCIN HCL IN DEXTROSE 750-5 MG/150ML-% IV SOLN
750.0000 mg | Freq: Two times a day (BID) | INTRAVENOUS | Status: DC
  Administered 2016-12-05: 750 mg via INTRAVENOUS
  Filled 2016-12-05 (×2): qty 150

## 2016-12-05 MED ORDER — SODIUM CHLORIDE 0.9 % IV SOLN
INTRAVENOUS | Status: AC
  Administered 2016-12-05: 125 mL/h via INTRAVENOUS

## 2016-12-05 MED ORDER — INSULIN ASPART 100 UNIT/ML ~~LOC~~ SOLN
0.0000 [IU] | Freq: Three times a day (TID) | SUBCUTANEOUS | Status: DC
  Administered 2016-12-05: 2 [IU] via SUBCUTANEOUS
  Administered 2016-12-05 – 2016-12-06 (×2): 1 [IU] via SUBCUTANEOUS

## 2016-12-05 MED ORDER — POTASSIUM CHLORIDE CRYS ER 20 MEQ PO TBCR
40.0000 meq | EXTENDED_RELEASE_TABLET | Freq: Two times a day (BID) | ORAL | Status: AC
  Administered 2016-12-05 (×3): 40 meq via ORAL
  Filled 2016-12-05 (×3): qty 2

## 2016-12-05 MED ORDER — DEXTROSE 5 % IV SOLN
1.0000 g | Freq: Three times a day (TID) | INTRAVENOUS | Status: DC
  Administered 2016-12-05 – 2016-12-06 (×5): 1 g via INTRAVENOUS
  Filled 2016-12-05 (×6): qty 1

## 2016-12-05 MED ORDER — VANCOMYCIN HCL IN DEXTROSE 1-5 GM/200ML-% IV SOLN
1000.0000 mg | Freq: Once | INTRAVENOUS | Status: AC
  Administered 2016-12-05: 1000 mg via INTRAVENOUS
  Filled 2016-12-05: qty 200

## 2016-12-05 MED ORDER — VENLAFAXINE HCL ER 150 MG PO CP24
150.0000 mg | ORAL_CAPSULE | Freq: Two times a day (BID) | ORAL | Status: DC
  Administered 2016-12-05 – 2016-12-08 (×8): 150 mg via ORAL
  Filled 2016-12-05 (×8): qty 1

## 2016-12-05 MED ORDER — ONDANSETRON HCL 4 MG PO TABS: 4.0000 mg | ORAL_TABLET | Freq: Four times a day (QID) | ORAL | Status: DC | PRN

## 2016-12-05 MED ORDER — ENOXAPARIN SODIUM 40 MG/0.4ML ~~LOC~~ SOLN
40.0000 mg | SUBCUTANEOUS | Status: DC
  Administered 2016-12-05 – 2016-12-07 (×3): 40 mg via SUBCUTANEOUS
  Filled 2016-12-05 (×2): qty 0.4

## 2016-12-05 MED ORDER — LURASIDONE HCL 40 MG PO TABS
120.0000 mg | ORAL_TABLET | Freq: Every day | ORAL | Status: DC
  Administered 2016-12-05 – 2016-12-08 (×4): 120 mg via ORAL
  Filled 2016-12-05 (×4): qty 3

## 2016-12-05 MED ORDER — ATORVASTATIN CALCIUM 20 MG PO TABS
20.0000 mg | ORAL_TABLET | Freq: Every day | ORAL | Status: DC
  Administered 2016-12-05 – 2016-12-07 (×2): 20 mg via ORAL
  Filled 2016-12-05 (×2): qty 1

## 2016-12-05 MED ORDER — VANCOMYCIN HCL IN DEXTROSE 1-5 GM/200ML-% IV SOLN: 1000.0000 mg | Freq: Once | INTRAVENOUS | Status: DC

## 2016-12-05 MED ORDER — ONDANSETRON HCL 4 MG/2ML IJ SOLN: 4.0000 mg | Freq: Four times a day (QID) | INTRAMUSCULAR | Status: DC | PRN

## 2016-12-05 MED ORDER — ACETAMINOPHEN 650 MG RE SUPP: 650.0000 mg | Freq: Four times a day (QID) | RECTAL | Status: DC | PRN

## 2016-12-05 MED ORDER — INSULIN ASPART 100 UNIT/ML ~~LOC~~ SOLN: 0.0000 [IU] | Freq: Every day | SUBCUTANEOUS | Status: DC

## 2016-12-05 MED ORDER — PANTOPRAZOLE SODIUM 40 MG PO TBEC
40.0000 mg | DELAYED_RELEASE_TABLET | Freq: Every day | ORAL | Status: DC
  Administered 2016-12-05 – 2016-12-08 (×4): 40 mg via ORAL
  Filled 2016-12-05 (×4): qty 1

## 2016-12-05 MED ORDER — ACETAMINOPHEN 325 MG PO TABS
650.0000 mg | ORAL_TABLET | Freq: Four times a day (QID) | ORAL | Status: DC | PRN
Start: 2016-12-05 — End: 2016-12-08

## 2016-12-05 MED ORDER — LEVETIRACETAM 500 MG PO TABS
1000.0000 mg | ORAL_TABLET | Freq: Two times a day (BID) | ORAL | Status: DC
  Administered 2016-12-05 – 2016-12-08 (×8): 1000 mg via ORAL
  Filled 2016-12-05 (×8): qty 2

## 2016-12-05 NOTE — Progress Notes (Signed)
PHARMACY - PHYSICIAN COMMUNICATION CRITICAL VALUE ALERT - BLOOD CULTURE IDENTIFICATION (BCID)  Results for orders placed or performed during the hospital encounter of 12/04/16  Blood Culture ID Panel (Reflexed) (Collected: 12/04/2016 11:29 PM)  Result Value Ref Range   Enterococcus species NOT DETECTED NOT DETECTED   Listeria monocytogenes NOT DETECTED NOT DETECTED   Staphylococcus species DETECTED (A) NOT DETECTED   Staphylococcus aureus NOT DETECTED NOT DETECTED   Methicillin resistance NOT DETECTED NOT DETECTED   Streptococcus species NOT DETECTED NOT DETECTED   Streptococcus agalactiae NOT DETECTED NOT DETECTED   Streptococcus pneumoniae NOT DETECTED NOT DETECTED   Streptococcus pyogenes NOT DETECTED NOT DETECTED   Acinetobacter baumannii NOT DETECTED NOT DETECTED   Enterobacteriaceae species NOT DETECTED NOT DETECTED   Enterobacter cloacae complex NOT DETECTED NOT DETECTED   Escherichia coli NOT DETECTED NOT DETECTED   Klebsiella oxytoca NOT DETECTED NOT DETECTED   Klebsiella pneumoniae NOT DETECTED NOT DETECTED   Proteus species NOT DETECTED NOT DETECTED   Serratia marcescens NOT DETECTED NOT DETECTED   Haemophilus influenzae NOT DETECTED NOT DETECTED   Neisseria meningitidis NOT DETECTED NOT DETECTED   Pseudomonas aeruginosa NOT DETECTED NOT DETECTED   Candida albicans NOT DETECTED NOT DETECTED   Candida glabrata NOT DETECTED NOT DETECTED   Candida krusei NOT DETECTED NOT DETECTED   Candida parapsilosis NOT DETECTED NOT DETECTED   Candida tropicalis NOT DETECTED NOT DETECTED    Name of physician (or Provider) Contacted:   NA  Changes to prescribed antibiotics required:   None required.  Likely contaminant in 1/2 bottles, and already receiving Fortaz, which would adequately cover.    Caryl Pina 12/05/2016  11:31 PM

## 2016-12-05 NOTE — Progress Notes (Signed)
CRITICAL VALUE ALERT  Critical value received:  Troponin 0.05   Date of notification:  12/05/16  Time of notification: 1800  Nurse who received alert:  Ginger  MD notified (1st page):  Kerney Elbe  Time of first page: [id number]  Time MD responded: [phone]

## 2016-12-05 NOTE — ED Notes (Signed)
Pt is being seen by admitting MD.  Notified pt that urine sample is still needed, she will let me know if she can void.

## 2016-12-05 NOTE — Progress Notes (Signed)
Pharmacy Antibiotic Note  Brandi [name] is a 60 y.o. female admitted on 12/04/2016 with sepsis.  Pharmacy has been consulted for Vancocin and Fortaz dosing.  Plan: Vancomycin 1000mg  x1 then 750mg  IV every 12 hours.  Goal trough 15-20 mcg/mL.  Fortaz 1g IV every 8 hours.  Height: 5\' 1"  (154.9 cm) Weight: 190 lb (86.2 kg) IBW/kg (Calculated) : 47.8  Temp (24hrs), Avg:98 F (36.7 C), Min:98 F (36.7 C), Max:98 F (36.7 C)   Recent Labs Lab 12/04/16 1944 12/04/16 2336  WBC 29.5*  --   CREATININE 1.16*  --   LATICACIDVEN  --  2.27*    Estimated Creatinine Clearance: 50.8 mL/min (by C-G formula based on SCr of 1.16 mg/dL (H)).    Allergies  Allergen Reactions  . Penicillins Rash    Has patient had a PCN reaction causing immediate rash, facial/tongue/throat swelling, SOB or lightheadedness with hypotension: No Has patient had a PCN reaction causing severe rash involving mucus membranes or skin necrosis: No Has patient had a PCN reaction that required hospitalization No Has patient had a PCN reaction occurring within the last 10 years: No If all of the above answers are "NO", then may proceed with Cephalosporin use.      Thank you for allowing pharmacy to be a part of this patient's care.  Wynona Neat, PharmD, BCPS  12/05/2016 1:16 AM

## 2016-12-05 NOTE — H&P (Signed)
History and Physical  Patient Name: Brandi [name]     [id number]    DOB: [birth year]    DOA: 12/04/2016 PCP: [name], MD   Patient coming from: Home  Chief Complaint: Vomiting and diarrhea  HPI: Brandi [name] is a 60 y.o. female with a past medical history significant for severe depression, NIDDM, HTN, and seizures who presents with N/V/D for 3 days.  The patient was in her usual state of health until Monday when she developed nausea, repeat NBNB vomiting and loose non-bloody stools.  She was out to eat with family over the weekend, and reportedly maybe another family member was also vomiting.  She saw her PCP yesterday and was given ondansetron, but since then has gotten worse, hasn't been able to keep anything down, has had subjective fevers, and is now weak and somewhat confused, so family brought her to the ER.  There was no dyspnea, cough, sputum.  She has had UTI and pyelonephritis in the past, but there has been no flank pain, foul urine, irritative voiding symptoms.  ED course: -Afebrile, heart rate 120, respirations 18-25, BP 141/65, pulse ox normal -Na 133, K 3.0, BUN 64, Cr 1.16 (baseline 0.9-1.0), WBC 29.5K, Hgb 11.4 -Lactic acid 2.27 -Lipase normal -CT abdomen and pelvis with contrast was unremarkable except for mild esophagitis -Cultures were obtained and she was started on meropenem for suspected intraabdominal infection     ROS: ROS        Past Medical History:  Diagnosis Date  . Allergic rhinitis   . Anemia   . Arthritis HX RIGHT ANKLE FX  . Cancer [facility])    breast, lumpectomy  . Depression    MAJOR  . Dyslipidemia   . GERD (gastroesophageal reflux disease)   . History of breast cancer DX 2010--  S/P LUMPECTOMY AND RADIATION -- NO RECURRENCE   ONCOLOGIST- DR Truddie Coco  . Hypertension   . Left flank pain   . Left ureteral calculus   . Migraines   . Schizophrenic disorder ([place])   . Tonic-clonic seizure disorder ([place]) X1  2010--  NO  SEIZURE SINCE  . Type II or unspecified type diabetes mellitus without mention of complication, not stated as uncontrolled 03/16/2014    Past Surgical History:  Procedure Laterality Date  . BREAST SURGERY  01-04-2009  DR Margot Chimes   LEFT BREAST LUMPECTOMY W/ SLN DISSECTION  FOR CANCER  . CATARACT EXTRACTION, BILATERAL Bilateral   . CESAREAN SECTION  1998  . CHILD BIRTH     C-SECTION  . CYSTOSCOPY WITH RETROGRADE PYELOGRAM, URETEROSCOPY AND STENT PLACEMENT  11/10/2012   Procedure: CYSTOSCOPY WITH RETROGRADE PYELOGRAM, URETEROSCOPY AND STENT PLACEMENT;  Surgeon: Hanley Ben, MD;  Location: [place];  Service: Urology;  Laterality: Left;  WITH DOUBLE J STENT  . HOLMIUM LASER APPLICATION  [id number]   Procedure: HOLMIUM LASER APPLICATION;  Surgeon: Hanley Ben, MD;  Location: [place];  Service: Urology;  Laterality: Left;  . LAPAROSCOPIC CHOLECYSTECTOMY  1990'S    Social History: Patient lives with her husband.  The patient walks unassisted.  She is on Disability for depression.  She is not a smoker.  She is from [place].    Allergies  Allergen Reactions  . Penicillins Rash    Has patient had a PCN reaction causing immediate rash, facial/tongue/throat swelling, SOB or lightheadedness with hypotension: No Has patient had a PCN reaction causing severe rash involving mucus membranes or skin necrosis: No Has patient had a PCN  reaction that required hospitalization No Has patient had a PCN reaction occurring within the last 10 years: No If all of the above answers are "NO", then may proceed with Cephalosporin use.     Family history: family history includes Cancer in her father, maternal grandmother, mother, paternal grandmother, and sister; Mental illness in her maternal uncle; Stroke in her maternal grandmother; Tuberculosis in her maternal grandfather.  Prior to Admission medications   Medication Sig Start Date End Date Taking? Authorizing  Provider  Ascorbic Acid (VITAMIN C) 1000 MG tablet Take 2,000 mg by mouth daily.    Historical Provider, MD  aspirin EC 81 MG tablet Take 81 mg by mouth daily.    Historical Provider, MD  atorvastatin (LIPITOR) 20 MG tablet TAKE 1 TABLET (20 MG TOTAL) BY MOUTH DAILY. 10/14/16   Denita Lung, MD  busPIRone (BUSPAR) 30 MG tablet Take 30 mg by mouth 2 (two) times daily. 11/01/14   Historical Provider, MD  calcium-vitamin D (OSCAL WITH D) 250-125 MG-UNIT per tablet Take 2 tablets by mouth daily. 05/18/14   Minette Headland, NP  fish oil-omega-3 fatty acids 1000 MG capsule Take 2 g by mouth daily.     Historical Provider, MD  Garlic [id number] MG CAPS Take 1,000 mg by mouth daily.     Historical Provider, MD  glucose blood (ONETOUCH VERIO) test strip Use 1 strip daily to test blood sugars. Dx code E11.9 06/27/15   Denita Lung, MD  Lacosamide (VIMPAT) 100 MG TABS Take 1 tablet (100 mg total) by mouth 2 (two) times daily. 10/31/16   Ward Givens, NP  LATUDA 120 MG TABS Take 1 tablet by mouth daily. 10/17/15   Historical Provider, MD  levETIRAcetam (KEPPRA) 500 MG tablet TAKE 2 TABLETS (1,000 MG TOTAL) BY MOUTH 2 (TWO) TIMES DAILY. 08/26/16   Kathrynn Ducking, MD  lisinopril (PRINIVIL,ZESTRIL) 5 MG tablet TAKE 1 TABLET (5 MG TOTAL) BY MOUTH DAILY. 06/17/16   Denita Lung, MD  loratadine (CLARITIN) 10 MG tablet Take 10 mg by mouth daily.     Historical Provider, MD  LORazepam (ATIVAN) 0.5 MG tablet Take 0.5 mg by mouth every evening.     Historical Provider, MD  magnesium 30 MG tablet Take 30 mg by mouth 2 (two) times daily.     Historical Provider, MD  Multiple Vitamins-Minerals (MULTIVITAMIN WITH MINERALS) tablet Take 1 tablet by mouth daily.     Historical Provider, MD  ondansetron (ZOFRAN-ODT) 4 MG disintegrating tablet Take 1 tablet (4 mg total) by mouth every 8 (eight) hours as needed for nausea or vomiting. 12/03/16   Girtha Rm, NP  pantoprazole (PROTONIX) 40 MG tablet TAKE 1 TABLET (40 MG TOTAL)  BY MOUTH DAILY. 09/05/16   Denita Lung, MD  Riboflavin (VITAMIN B-2 PO) Take 100 mg by mouth 2 (two) times daily.    Historical Provider, MD  venlafaxine XR (EFFEXOR-XR) 150 MG 24 hr capsule Take 150 mg by mouth 2 (two) times daily. 10/16/15   Historical Provider, MD       Physical Exam: BP 151/88 (BP Location: Left Arm)   Pulse 109   Temp 98 F (36.7 C) (Oral)   Resp 25   Ht 5\' 1"  (1.549 m)   Wt 86.2 kg (190 lb)   SpO2 96%   BMI 35.90 kg/m  General appearance: Obese adult female, awake and responding to questions and in mild distress from malaise.   Eyes: Anicteric, conjunctiva pink, lids and lashes normal.  PERRL.    ENT: No nasal deformity, discharge, epistaxis.  Hearing normal. OP tacky dry without lesions.   Neck: No neck masses.  Trachea midline.  No thyromegaly/tenderness. Lymph: No cervical or supraclavicular lymphadenopathy. Skin: Warm and dry.  No jaundice.  No suspicious rashes or lesions. Cardiac: Tachycardic, regular, nl S1-S2, no murmurs appreciated.  Capillary refill is sluggish, no mottling.  JVP not visible.  No LE edema.  Radial pulses 2+ and symmetric. Respiratory: Normal respiratory rate and rhythm.  CTAB without rales or wheezes. Abdomen: Abdomen soft.  No TTP. No ascites, distension, hepatosplenomegaly.   MSK: No deformities or effusions.  No cyanosis or clubbing. Neuro: Cranial nerves normal.  Sensation intact to light touch. Speech is fluent.  Muscle strength globally diminished, symetric.    Psych: Sensorium intact and responding to questions, attention diminished.  Cannot remember what month it is.  Oriented to place.  Affect blunted.  Judgment and insight appear poor.       Labs on Admission:  I have personally reviewed following labs and imaging studies: CBC:  Recent Labs Lab 12/04/16 1944  WBC 29.5*  HGB 11.4*  HCT 35.6*  MCV 80.7  PLT [id number]*   Basic Metabolic Panel:  Recent Labs Lab 12/04/16 1944  NA 133*  K 3.0*  CL 97*  CO2 25    GLUCOSE 219*  BUN 64*  CREATININE 1.16*  CALCIUM 9.2   GFR: Estimated Creatinine Clearance: 50.8 mL/min (by C-G formula based on SCr of 1.16 mg/dL (H)).  Liver Function Tests:  Recent Labs Lab 12/04/16 1944  AST 54*  ALT 49  ALKPHOS 91  BILITOT 0.6  PROT 6.6  ALBUMIN 3.7    Recent Labs Lab 12/04/16 1944  LIPASE 11   No results for input(s): AMMONIA in the last 168 hours. Coagulation Profile: No results for input(s): INR, PROTIME in the last 168 hours. Cardiac Enzymes: No results for input(s): CKTOTAL, CKMB, CKMBINDEX, TROPONINI in the last 168 hours. BNP (last 3 results) No results for input(s): PROBNP in the last 8760 hours. HbA1C: No results for input(s): HGBA1C in the last 72 hours. CBG: No results for input(s): GLUCAP in the last 168 hours. Lipid Profile: No results for input(s): CHOL, HDL, LDLCALC, TRIG, CHOLHDL, LDLDIRECT in the last 72 hours. Thyroid Function Tests: No results for input(s): TSH, T4TOTAL, FREET4, T3FREE, THYROIDAB in the last 72 hours. Anemia Panel: No results for input(s): VITAMINB12, FOLATE, FERRITIN, TIBC, IRON, RETICCTPCT in the last 72 hours. Sepsis Labs: Lactic acid 2.27 Invalid input(s): PROCALCITONIN, LACTICIDVEN No results found for this or any previous visit (from the past 240 hour(s)).       Radiological Exams on Admission: Personally reviewed CT report: Ct Abdomen Pelvis W Contrast  Result Date: 12/04/2016 CLINICAL DATA:  60 year old female with leukocytosis, nausea vomiting and diarrhea. EXAM: CT ABDOMEN AND PELVIS WITH CONTRAST TECHNIQUE: Multidetector CT imaging of the abdomen and pelvis was performed using the standard protocol following bolus administration of intravenous contrast. CONTRAST:  1 ISOVUE-300 IOPAMIDOL (ISOVUE-300) INJECTION 61% COMPARISON:  Abdominal CT dated 06/29/2013 FINDINGS: Lower chest: The visualized lung bases are clear. No intra-abdominal free air or free fluid. Hepatobiliary: Cholecystectomy.  Diffuse fatty infiltration of the liver. No intrahepatic biliary ductal dilatation. Pancreas: Unremarkable. No pancreatic ductal dilatation or surrounding inflammatory changes. Spleen: Normal in size without focal abnormality. Adrenals/Urinary Tract: The adrenal glands appear unremarkable. Small nonobstructing bilateral renal calculi measure up to 5 mm in the inferior pole of the right kidney. There is no hydronephrosis  on either side. Subcentimeter bilateral renal hypodense lesions are too small to characterize but most likely represent cysts. The visualized ureters and urinary bladder appear unremarkable. Stomach/Bowel: There scattered sigmoid diverticula without active inflammatory changes. There is a small hiatal hernia with gastroesophageal reflux. There is circumferential thickened appearance of the distal esophagus concerning for esophagitis likely related to chronic irritation and reflux. There is no evidence of bowel obstruction. The appendix is not visualized with certainty. No inflammatory changes identified in the right lower quadrant. Vascular/Lymphatic: No significant vascular findings are present. No enlarged abdominal or pelvic lymph nodes. Reproductive: The uterus and ovaries are grossly unremarkable. Other: There is diastases of anterior abdominal wall musculature in the midline with a small fat containing umbilical hernia. No fluid collection. Musculoskeletal: Benign appearing sclerotic focus in the left sacral alar stop similar to prior CT most compatible with a bone island. Multilevel facet degenerative changes. No acute fracture. IMPRESSION: Thickened appearance of the distal esophagus concerning for esophagitis likely related to chronic irritation and reflux. No definite acute intra- abdominopelvic pathology. Small nonobstructing bilateral renal calculi.  No hydronephrosis. Small scattered sigmoid diverticula. Electronically Signed   By: Anner Crete M.D.   On: 12/04/2016 23:28    EKG:  Independently reviewed. Rate 109, RBBB and LAFB are new.     Assessment/Plan  1. Possible sepsis, unclear source:  Suspected source unclear, may all be viral syndrome/gastroenteritis. Organism unknown.   Patient meets criteria given tachycardia, tachypnea, leukocytosis, and evidence of organ dysfunction.  Lactate 2.27 mmol/L and repeat ordered within 6 hours.  This patient is at high risk of poor outcomes with a qSOFA score of 2 (respiratory rate of 22/min or greater, altered mentation).  Antibiotics delivered in the ED.    -Sepsis bundle utilized:  -Follow UA  -Obtain GI pathogen panel given hx of family member affected  -Blood and urine cultures drawn  -30 ml/kg bolus given in ED, will repeat lactic acid  -Start targeted antibiotics with vancomycin and ceftazidime   -Repeat renal function and complete blood count in AM  -Low threshold to de-esclate if UA normal and blood cultures negative    2. Hyponatremia:  Hypovolemic. -Fluids and trend BMP  3. Hypokalemia:  -Check Mag -Supplement orally  4. NIDDM with hyperglycemia:  Not currently on antiglycemics. -Check HgbA1c -SSI with meals  5. Pre-renal azotemia:  Mild elevated BUN in setting of clinical dehydration. -Fluids and monitor Cr  6. Anemia:  Baseline 12-13.  Slightly down.  No history to suggest blood loss. -Trend CBC  7. Mood disorder:  -Continue Latuda, venlafaxine, buspirone -Continue Ativan QHS  8. Seizure disorder: -Continue Vimpat and Keppra  9. HTN: -Hold lisinopril until fluid resuscitated -Continue statin, aspirin  10. Other: -Continue PPI       DVT prophylaxis: Lovenox  Code Status: FULL  Family Communication: Husband at bedside  Disposition Plan: Anticipate IV fluids and trend lactate. Empiric antibiotics and follow UA, GI panel and troponin. Consults called: None Admission status: INPATIENT        Medical decision making: Patient seen at 12:07 AM on 12/05/2016.  The patient  was discussed with Dr. Stark Jock.  What exists of the patient's chart was reviewed in depth and summarized above.  Clinical condition: confused but hemodynamically stable, has been fluid resuscitated, appropriate for medical floor.        Edwin Dada [facility] Pager [phone]      At the time of admission, it appears that the appropriate admission status for  this patient is INPATIENT. This is judged to be reasonable and necessary in order to provide the required intensity of service to ensure the patient's safety given the presenting symptoms, physical exam findings, and initial radiographic and laboratory data in the context of their chronic comorbidities.  Together, these circumstances are felt to place her/him at high risk for further clinical deterioration threatening life, limb, or organ. Patient requires inpatient status due to high intensity of service, high risk for further deterioration and high frequency of surveillance required because of this suspected acute illness that poses a threat to life or bodily function.  I certify that at the point of admission it is my clinical judgment that the patient will require inpatient hospital care spanning beyond 2 midnights from the point of admission and that early discharge would result in unnecessary risk of decompensation and readmission or threat to life, limb or bodily function.

## 2016-12-05 NOTE — ED Notes (Signed)
Family made aware of pts bed assignment 

## 2016-12-05 NOTE — Progress Notes (Signed)
CRITICAL VALUE ALERT  Critical value received:  Troponin 0.03  Date of notification:  12/05/16  Time of notification:  0315  Critical value read back:Yes.    Nurse who received alert:  Ander Purpura RN  MD notified (1st page):  [facility] Coverage  Time of first page:  [phone]

## 2016-12-05 NOTE — Progress Notes (Signed)
The patient was admitted over night after midnight and H and P has been reviewed and am currently in agreement with the Assessment and Plan. Additional changes to the plan of care include de-escalating IV Abx and stopping Vancomycin. The patient is a 60 year old Caucasian female with a PMH of Depression, NIDDM, HTN, Seizures who presented with 3 day of Nausea/Vomiting/Diarrhea x 3 days. Sepsis protocol was enacted as she was tachycardic, tachypenic, and had leukocytosis and elevated Lactate level of 2.27. She was given 30 mL/kg bolus and started on Vancomycin and Ceftazidime. U/A showed Positive Nitrites and Moderate Leukocytes with 6-30 WBC and Rare Bacteria. Blood Cx's and Urine Cx's are still pending. Patient states that her Nausea and Vomiting have improved and WBC is trending down to 24.5. Patient's Troponin was elevated at 0.03 so will repeat and if trending up will consider a Cardiology consultation. Patient was also found to be Hypokalemic which was repleted. Will continue to monitor very closely.

## 2016-12-06 DIAGNOSIS — B952 Enterococcus as the cause of diseases classified elsewhere: Secondary | ICD-10-CM

## 2016-12-06 LAB — COMPREHENSIVE METABOLIC PANEL
ALBUMIN: 2.8 g/dL — AB (ref 3.5–5.0)
ALT: 32 U/L (ref 14–54)
AST: 40 U/L (ref 15–41)
Alkaline Phosphatase: 69 U/L (ref 38–126)
Anion gap: 5 (ref 5–15)
BILIRUBIN TOTAL: 0.7 mg/dL (ref 0.3–1.2)
BUN: 11 mg/dL (ref 6–20)
CO2: 23 mmol/L (ref 22–32)
Calcium: 8.4 mg/dL — ABNORMAL LOW (ref 8.9–10.3)
Chloride: 112 mmol/L — ABNORMAL HIGH (ref 101–111)
Creatinine, Ser: 0.72 mg/dL (ref 0.44–1.00)
GFR calc Af Amer: >60 mL/min (ref >60)
GFR calc non Af Amer: >60 mL/min (ref >60)
GLUCOSE: 124 mg/dL — AB (ref 65–99)
POTASSIUM: 4.4 mmol/L (ref 3.5–5.1)
SODIUM: 140 mmol/L (ref 135–145)
TOTAL PROTEIN: 5.1 g/dL — AB (ref 6.5–8.1)

## 2016-12-06 LAB — CULTURE, BLOOD (ROUTINE X 2)

## 2016-12-06 LAB — CBC WITH DIFFERENTIAL/PLATELET
BASOS ABS: 0 10*3/uL (ref 0.0–0.1)
BASOS PCT: 0 %
EOS ABS: 0.1 10*3/uL (ref 0.0–0.7)
Eosinophils Relative: 1 %
HEMATOCRIT: 26.9 % — AB (ref 36.0–46.0)
HEMOGLOBIN: 8.6 g/dL — AB (ref 12.0–15.0)
Lymphocytes Relative: 18 %
Lymphs Abs: 2.5 10*3/uL (ref 0.7–4.0)
MCH: 26.6 pg (ref 26.0–34.0)
MCHC: 32 g/dL (ref 30.0–36.0)
MCV: 83.3 fL (ref 78.0–100.0)
MONO ABS: 1.2 10*3/uL — AB (ref 0.1–1.0)
Monocytes Relative: 8 %
NEUTROS ABS: 10.3 10*3/uL — AB (ref 1.7–7.7)
NEUTROS PCT: 73 %
Platelets: 328 10*3/uL (ref 150–400)
RBC: 3.23 MIL/uL — ABNORMAL LOW (ref 3.87–5.11)
RDW: 15.9 % — AB (ref 11.5–15.5)
WBC: 14.1 10*3/uL — ABNORMAL HIGH (ref 4.0–10.5)

## 2016-12-06 LAB — PHOSPHORUS: Phosphorus: 1.4 mg/dL — ABNORMAL LOW (ref 2.5–4.6)

## 2016-12-06 LAB — GLUCOSE, CAPILLARY
GLUCOSE-CAPILLARY: 117 mg/dL — AB (ref 65–99)
GLUCOSE-CAPILLARY: 144 mg/dL — AB (ref 65–99)
Glucose-Capillary: 90 mg/dL (ref 65–99)

## 2016-12-06 LAB — TROPONIN I

## 2016-12-06 LAB — MAGNESIUM: Magnesium: 2.1 mg/dL (ref 1.7–2.4)

## 2016-12-06 MED ORDER — LEVOFLOXACIN IN D5W 750 MG/150ML IV SOLN
750.0000 mg | INTRAVENOUS | Status: AC
  Administered 2016-12-07: 750 mg via INTRAVENOUS
  Filled 2016-12-06 (×2): qty 150

## 2016-12-06 NOTE — Progress Notes (Signed)
PROGRESS NOTE    Brandi [name]  MRN:[id number] DOB: 08/09/1956 DOA: 12/04/2016 PCP: LALONDE,JOHN CHARLES, MD   Brief Narrative:  Brandi [name] is a 60 y.o. female with a past medical history significant for severe depression, NIDDM, HTN, and seizures who presents with N/V/D for 3 days.The patient was in her usual state of health until Monday when she developed nausea, repeat NBNB vomiting and loose non-bloody stools.  She was out to eat with family over the weekend, and reportedly maybe another family member was also vomiting.  She saw her PCP yesterday and was given ondansetron, but since then has gotten worse, hasn't been able to keep anything down, has had subjective fevers, and is now weak and somewhat confused, so family brought her to the ER.  There was no dyspnea, cough, sputum.  She has had UTI and pyelonephritis in the past, but there has been no flank pain, foul urine, irritative voiding symptoms. She was worked up and found to have another Urinary Tract Infection with E. Faecalis. Patient is steadily improving.   Assessment & Plan:   Principal Problem:   Sepsis, unspecified organism (HCC) Active Problems:   Major depression   Type 2 diabetes mellitus without complication, without long-term current use of insulin (HCC)   Convulsions/seizures (HCC)   Essential hypertension   Hyponatremia   Hypokalemia   Anemia   Prerenal azotemia   Sepsis (HCC)   Acute gastroenteritis   Acute renal failure (HCC)   Dehydration  1. Sepsis 2/2 to Enterococcus Faecalis UTI with possible concomitant Gasteroenteritis -Sepsis physiology resolving -Recently got over Strep Throat -Patient met criteria given tachycardia, tachypnea, leukocytosis, and evidence of organ dysfunction.   -Lactate 2.27 mmol/L and repeat ordered within 6 hours and trended down to 1.1 -Sepsis bundle utilized -Urine Cx showed >100,000 CFU of Enterococcus Faecalis -WBC on Admission was 29.5 -> 14.1 today -IV  Vancomycin and Fortaz D/C'd- Switched to IV Levaquin  2. Hyponatremia: -Improved  -Hypovolemic. -Given IVF Rehydration and Sodium went from 133 -> 140; IVF D/C'd -Repeat CMP in AM  3. Hypokalemia:  -Improved -K+ went from 3.0 -> 4.4 -Repelte as necessary -Repeat CMP in AM  4. NIDDM with Hyperglycemia:  -Not currently on antiglycemics. -Check HgbA1c -C/w Sensitive Novolog SSI AC and HS -CBG ranging from 111-144  5. Pre-renal Azotemia:  -Mild elevated BUN in setting of clinical dehydration. -Fluids and monitor Cr -Improved to 11/0.72  6. Anemia likely Dilutional:  -Baseline 12-13. No history to suggest blood loss. -Hb/Hct went to 8.6/26.9; Likely from all the fluid patient has recieved -Repeat CBC in AM  7. Mood Disorder:  -Continue Latuda, Venlafaxine, Buspirone -Continue Ativan 0.5 mg QHS  8. Seizure disorder: -Continue Vimpat 100 mg po BID and Keppra 1000 mg po BID  9. HTN: -Hold Lisinopril until fluid resuscitated  10. GERD -Continue PPI with Protonix 40 mg po Daily  11. Mildly Elevated Troponins -Likely from Sepsis Physiology -Denied CP -Tropoinin now <0.03  DVT prophylaxis: Lovenox 40 mg sq Code Status: FULL Family Communication: Discussed Plan of Care with patient's Husband Disposition Plan: PT to Eval; Likely Home  Consultants:   None  Procedures: None   Antimicrobials: IV Vancomycin and IV Fortaz D/C'd; IV Levaquin started 12/06/2016 ->  Subjective: Seen and examined at bedside and was doing better. No Nasuea or Vomiting. Denied CP or SOB. States she is tolerating food well. No other concerns or complaints at this time.   Objective: Vitals:   12/05/16 0557 12/05/16 1449 12/05/16   2123 12/06/16 0700  BP: 115/64 (!) 103/56 (!) 132/59 126/63  Pulse: (!) 109 (!) 101 (!) 101 (!) 101  Resp: _0 Temp: 98.2 F (36.8 C) 98.2 F (36.8 C) 98.7 F (37.1 C) 97.9 F (36.6 C)  TempSrc: Oral Oral Oral Oral  SpO2: 90% 98% 91% 91%    Weight:      Height:        Intake/Output Summary (Last 24 hours) at 12/06/16 0753 Last data filed at 12/06/16 0321  Gross per 24 hour  Intake          1518.75 ml  Output                1 ml  Net          1517.75 ml   Filed Weights   12/04/16 1926 12/04/16 2225  Weight: 86.2 kg (190 lb) 86.2 kg (190 lb)    Examination: Physical Exam:  Constitutional: NAD and appears calm and comfortable Eyes: Lids and conjunctivae normal, sclerae anicteric  ENMT: External Ears, Nose appear normal. Grossly normal hearing.  Neck: Appears normal, supple, no cervical masses, normal ROM, no appreciable thyromegaly, no JVD Respiratory: Clear to auscultation bilaterally, no wheezing, rales, rhonchi or crackles. Normal respiratory effort and patient is not tachypenic. No accessory muscle use.  Cardiovascular: RRR, no murmurs / rubs / gallops. S1 and S2 auscultated. No extremity edema.  Abdomen: Soft, non-tender, non-distended. No masses palpated. No appreciable hepatosplenomegaly. Bowel sounds positive x4.  GU: Deferred. Musculoskeletal: No clubbing / cyanosis of digits/nails. No joint deformity upper and lower extremities. No contractures.  Skin: No rashes, lesions, ulcers on limited skin evaluation. No induration; Warm and dry.  Neurologic: CN 2-12 grossly intact with no focal deficits. Sensation intact in all 4 Extremities. Romberg sign cerebellar reflexes not assessed.  Psychiatric: Normal judgment and insight. Alert and oriented x 3. Normal mood and Flat affect.   Data Reviewed: I have personally reviewed following labs and imaging studies  CBC:  Recent Labs Lab 12/04/16 1944 12/05/16 0208 12/06/16 0437  WBC 29.5* 24.5* 14.1*  NEUTROABS  --   --  10.3*  HGB 11.4* 9.8* 8.6*  HCT 35.6* 29.7* 26.9*  MCV 80.7 80.5 83.3  PLT 488* 426* 419   Basic Metabolic Panel:  Recent Labs Lab 12/04/16 1944 12/05/16 0208 12/06/16 0437  NA 133* 135 140  K 3.0* 3.2* 4.4  CL 97* 102 112*  CO2 _1 GLUCOSE 219* 172* 124*  BUN 64* 46* 11  CREATININE 1.16* 0.90 0.72  CALCIUM 9.2 8.1* 8.4*  MG  --  2.1 2.1  PHOS  --   --  1.4*   GFR: Estimated Creatinine Clearance: 73.7 mL/min (by C-G formula based on SCr of 0.72 mg/dL). Liver Function Tests:  Recent Labs Lab 12/04/16 1944 12/05/16 0208 12/06/16 0437  AST 54* 44* 40  ALT 49 41 32  ALKPHOS 91 76 69  BILITOT 0.6 0.5 0.7  PROT 6.6 5.5* 5.1*  ALBUMIN 3.7 3.2* 2.8*    Recent Labs Lab 12/04/16 1944  LIPASE 11   No results for input(s): AMMONIA in the last 168 hours. Coagulation Profile: No results for input(s): INR, PROTIME in the last 168 hours. Cardiac Enzymes:  Recent Labs Lab 12/05/16 0208 12/05/16 1529 12/05/16 2037 12/06/16 0437  TROPONINI 0.03* 0.05* 0.03* <0.03   BNP (last 3 results) No results for input(s): PROBNP in the last 8760 hours. HbA1C: No results for input(s): HGBA1C in  the last 72 hours. CBG:  Recent Labs Lab 12/05/16 0615 12/05/16 1148 12/05/16 1700 12/05/16 2118 12/06/16 0650  GLUCAP 178* 146* 119* 111* 117*   Lipid Profile: No results for input(s): CHOL, HDL, LDLCALC, TRIG, CHOLHDL, LDLDIRECT in the last 72 hours. Thyroid Function Tests: No results for input(s): TSH, T4TOTAL, FREET4, T3FREE, THYROIDAB in the last 72 hours. Anemia Panel: No results for input(s): VITAMINB12, FOLATE, FERRITIN, TIBC, IRON, RETICCTPCT in the last 72 hours. Sepsis Labs:  Recent Labs Lab 12/04/16 2336 12/05/16 0208 12/05/16 0510  LATICACIDVEN 2.27* 1.8 1.1    Recent Results (from the past 240 hour(s))  Blood Culture (routine x 2)     Status: None (Preliminary result)   Collection Time: 12/04/16 10:22 PM  Result Value Ref Range Status   Specimen Description BLOOD RIGHT ARM  Final   Special Requests IN PEDIATRIC BOTTLE 4ML  Final   Culture NO GROWTH < 24 HOURS  Final   Report Status PENDING  Incomplete  Blood Culture (routine x 2)     Status: None (Preliminary result)   Collection Time:  12/04/16 11:29 PM  Result Value Ref Range Status   Specimen Description BLOOD LEFT HAND  Final   Special Requests IN PEDIATRIC BOTTLE 2ML  Final   Culture  Setup Time   Final    GRAM POSITIVE COCCI IN CLUSTERS AEROBIC BOTTLE ONLY Organism ID to follow CRITICAL RESULT CALLED TO, READ BACK BY AND VERIFIED WITH: G ABBOTT PHARMD [id number] 12/05/16 A BROWNING    Culture NO GROWTH < 24 HOURS  Final   Report Status PENDING  Incomplete  Blood Culture ID Panel (Reflexed)     Status: Abnormal   Collection Time: 12/04/16 11:29 PM  Result Value Ref Range Status   Enterococcus species NOT DETECTED NOT DETECTED Final   Listeria monocytogenes NOT DETECTED NOT DETECTED Final   Staphylococcus species DETECTED (A) NOT DETECTED Final    Comment: CRITICAL RESULT CALLED TO, READ BACK BY AND VERIFIED WITH: G ABBOTT PHARMD [id number] 12/05/16 A BROWNING    Staphylococcus aureus NOT DETECTED NOT DETECTED Final   Methicillin resistance NOT DETECTED NOT DETECTED Final   Streptococcus species NOT DETECTED NOT DETECTED Final   Streptococcus agalactiae NOT DETECTED NOT DETECTED Final   Streptococcus pneumoniae NOT DETECTED NOT DETECTED Final   Streptococcus pyogenes NOT DETECTED NOT DETECTED Final   Acinetobacter baumannii NOT DETECTED NOT DETECTED Final   Enterobacteriaceae species NOT DETECTED NOT DETECTED Final   Enterobacter cloacae complex NOT DETECTED NOT DETECTED Final   Escherichia coli NOT DETECTED NOT DETECTED Final   Klebsiella oxytoca NOT DETECTED NOT DETECTED Final   Klebsiella pneumoniae NOT DETECTED NOT DETECTED Final   Proteus species NOT DETECTED NOT DETECTED Final   Serratia marcescens NOT DETECTED NOT DETECTED Final   Haemophilus influenzae NOT DETECTED NOT DETECTED Final   Neisseria meningitidis NOT DETECTED NOT DETECTED Final   Pseudomonas aeruginosa NOT DETECTED NOT DETECTED Final   Candida albicans NOT DETECTED NOT DETECTED Final   Candida glabrata NOT DETECTED NOT DETECTED Final   Candida  krusei NOT DETECTED NOT DETECTED Final   Candida parapsilosis NOT DETECTED NOT DETECTED Final   Candida tropicalis NOT DETECTED NOT DETECTED Final   Radiology Studies: Ct Abdomen Pelvis W Contrast  Result Date: 12/04/2016 CLINICAL DATA:  60-year-old female with leukocytosis, nausea vomiting and diarrhea. EXAM: CT ABDOMEN AND PELVIS WITH CONTRAST TECHNIQUE: Multidetector CT imaging of the abdomen and pelvis was performed using the standard protocol following bolus administration of intravenous   contrast. CONTRAST:  1 ISOVUE-300 IOPAMIDOL (ISOVUE-300) INJECTION 61% COMPARISON:  Abdominal CT dated 06/29/2013 FINDINGS: Lower chest: The visualized lung bases are clear. No intra-abdominal free air or free fluid. Hepatobiliary: Cholecystectomy. Diffuse fatty infiltration of the liver. No intrahepatic biliary ductal dilatation. Pancreas: Unremarkable. No pancreatic ductal dilatation or surrounding inflammatory changes. Spleen: Normal in size without focal abnormality. Adrenals/Urinary Tract: The adrenal glands appear unremarkable. Small nonobstructing bilateral renal calculi measure up to 5 mm in the inferior pole of the right kidney. There is no hydronephrosis on either side. Subcentimeter bilateral renal hypodense lesions are too small to characterize but most likely represent cysts. The visualized ureters and urinary bladder appear unremarkable. Stomach/Bowel: There scattered sigmoid diverticula without active inflammatory changes. There is a small hiatal hernia with gastroesophageal reflux. There is circumferential thickened appearance of the distal esophagus concerning for esophagitis likely related to chronic irritation and reflux. There is no evidence of bowel obstruction. The appendix is not visualized with certainty. No inflammatory changes identified in the right lower quadrant. Vascular/Lymphatic: No significant vascular findings are present. No enlarged abdominal or pelvic lymph nodes. Reproductive: The  uterus and ovaries are grossly unremarkable. Other: There is diastases of anterior abdominal wall musculature in the midline with a small fat containing umbilical hernia. No fluid collection. Musculoskeletal: Benign appearing sclerotic focus in the left sacral alar stop similar to prior CT most compatible with a bone island. Multilevel facet degenerative changes. No acute fracture. IMPRESSION: Thickened appearance of the distal esophagus concerning for esophagitis likely related to chronic irritation and reflux. No definite acute intra- abdominopelvic pathology. Small nonobstructing bilateral renal calculi.  No hydronephrosis. Small scattered sigmoid diverticula. Electronically Signed   By: Arash  Radparvar M.D.   On: 12/04/2016 23:28   Scheduled Meds: . aspirin EC  81 mg Oral Daily  . atorvastatin  20 mg Oral q1800  . busPIRone  30 mg Oral BID  . cefTAZidime (FORTAZ)  IV  1 g Intravenous Q8H  . enoxaparin (LOVENOX) injection  40 mg Subcutaneous Q24H  . insulin aspart  0-5 Units Subcutaneous QHS  . insulin aspart  0-9 Units Subcutaneous TID WC  . lacosamide  100 mg Oral BID  . levETIRAcetam  1,000 mg Oral BID  . LORazepam  0.5 mg Oral QPM  . lurasidone  120 mg Oral Daily  . pantoprazole  40 mg Oral Daily  . venlafaxine XR  150 mg Oral BID   Continuous Infusions: . sodium chloride 75 mL/hr at 12/05/16 1936    LOS: 1 day   Omair Latif Sheikh, DO [facility] Pager [phone]  If 7PM-7AM, please contact night-coverage [email or web address] Password [email or web address] 12/06/2016, 7:53 AM  

## 2016-12-07 DIAGNOSIS — R569 Unspecified convulsions: Secondary | ICD-10-CM

## 2016-12-07 DIAGNOSIS — A419 Sepsis, unspecified organism: Secondary | ICD-10-CM

## 2016-12-07 DIAGNOSIS — E871 Hypo-osmolality and hyponatremia: Secondary | ICD-10-CM

## 2016-12-07 DIAGNOSIS — E119 Type 2 diabetes mellitus without complications: Secondary | ICD-10-CM

## 2016-12-07 DIAGNOSIS — K529 Noninfective gastroenteritis and colitis, unspecified: Secondary | ICD-10-CM

## 2016-12-07 DIAGNOSIS — F323 Major depressive disorder, single episode, severe with psychotic features: Secondary | ICD-10-CM

## 2016-12-07 DIAGNOSIS — R7989 Other specified abnormal findings of blood chemistry: Secondary | ICD-10-CM

## 2016-12-07 DIAGNOSIS — N179 Acute kidney failure, unspecified: Secondary | ICD-10-CM

## 2016-12-07 DIAGNOSIS — I10 Essential (primary) hypertension: Secondary | ICD-10-CM

## 2016-12-07 DIAGNOSIS — E860 Dehydration: Secondary | ICD-10-CM

## 2016-12-07 DIAGNOSIS — E876 Hypokalemia: Secondary | ICD-10-CM

## 2016-12-07 LAB — MAGNESIUM: Magnesium: 1.9 mg/dL (ref 1.7–2.4)

## 2016-12-07 LAB — CBC WITH DIFFERENTIAL/PLATELET
BASOS ABS: 0 10*3/uL (ref 0.0–0.1)
Basophils Relative: 0 %
EOS ABS: 0.3 10*3/uL (ref 0.0–0.7)
Eosinophils Relative: 3 %
HEMATOCRIT: 27.9 % — AB (ref 36.0–46.0)
HEMOGLOBIN: 8.9 g/dL — AB (ref 12.0–15.0)
LYMPHS PCT: 22 %
Lymphs Abs: 2 10*3/uL (ref 0.7–4.0)
MCH: 26.4 pg (ref 26.0–34.0)
MCHC: 31.9 g/dL (ref 30.0–36.0)
MCV: 82.8 fL (ref 78.0–100.0)
MONOS PCT: 9 %
Monocytes Absolute: 0.8 10*3/uL (ref 0.1–1.0)
NEUTROS PCT: 66 %
Neutro Abs: 6 10*3/uL (ref 1.7–7.7)
Platelets: 353 10*3/uL (ref 150–400)
RBC: 3.37 MIL/uL — AB (ref 3.87–5.11)
RDW: 15.8 % — ABNORMAL HIGH (ref 11.5–15.5)
WBC: 9.1 10*3/uL (ref 4.0–10.5)

## 2016-12-07 LAB — URINE CULTURE

## 2016-12-07 LAB — COMPREHENSIVE METABOLIC PANEL
ALBUMIN: 2.9 g/dL — AB (ref 3.5–5.0)
ALK PHOS: 76 U/L (ref 38–126)
ALT: 39 U/L (ref 14–54)
ANION GAP: 10 (ref 5–15)
AST: 38 U/L (ref 15–41)
BILIRUBIN TOTAL: 0.7 mg/dL (ref 0.3–1.2)
BUN: 5 mg/dL — AB (ref 6–20)
CALCIUM: 8.6 mg/dL — AB (ref 8.9–10.3)
CO2: 21 mmol/L — AB (ref 22–32)
Chloride: 111 mmol/L (ref 101–111)
Creatinine, Ser: 0.56 mg/dL (ref 0.44–1.00)
GFR calc Af Amer: >60 mL/min (ref >60)
GFR calc non Af Amer: >60 mL/min (ref >60)
GLUCOSE: 116 mg/dL — AB (ref 65–99)
Potassium: 3.6 mmol/L (ref 3.5–5.1)
SODIUM: 142 mmol/L (ref 135–145)
TOTAL PROTEIN: 5.6 g/dL — AB (ref 6.5–8.1)

## 2016-12-07 LAB — GLUCOSE, CAPILLARY
GLUCOSE-CAPILLARY: 117 mg/dL — AB (ref 65–99)
GLUCOSE-CAPILLARY: 109 mg/dL — AB (ref 65–99)
GLUCOSE-CAPILLARY: 107 mg/dL — AB (ref 65–99)
Glucose-Capillary: 110 mg/dL — ABNORMAL HIGH (ref 65–99)

## 2016-12-07 LAB — PHOSPHORUS: Phosphorus: 2.4 mg/dL — ABNORMAL LOW (ref 2.5–4.6)

## 2016-12-07 MED ORDER — LEVOFLOXACIN 500 MG PO TABS
750.0000 mg | ORAL_TABLET | Freq: Every day | ORAL | Status: DC
  Administered 2016-12-08: 750 mg via ORAL
  Filled 2016-12-07: qty 2

## 2016-12-07 MED ORDER — POTASSIUM CHLORIDE CRYS ER 20 MEQ PO TBCR
40.0000 meq | EXTENDED_RELEASE_TABLET | Freq: Once | ORAL | Status: AC
  Administered 2016-12-07: 40 meq via ORAL
  Filled 2016-12-07: qty 2

## 2016-12-07 NOTE — Progress Notes (Signed)
PROGRESS NOTE    Brandi [name]  HYI:[id number] DOB: [birth year] DOA: 12/04/2016 PCP: [name], MD   Chief Complaint  Patient presents with  . Emesis    Brief Narrative:  Brandi [name] a 60 y.o.femalewith a past medical history significant for severe depression, NIDDM, HTN, and seizureswho presents with N/V/D for 3 days.The patient was in her usual state of health until Monday when she developed nausea, repeat NBNB vomiting and loose non-bloody stools. She was out to eat with familyover the weekend, and reportedly maybe another family member was also vomiting. She saw her PCP yesterday and was given ondansetron, but since then has gotten worse, hasn't been able to keep anything down, has had subjective fevers, and is now weak and somewhat confused, so family brought her to the ER. There was no dyspnea, cough, sputum. She has had UTI and pyelonephritis in the past, but there has been no flank pain, foul urine, irritative voiding symptoms. She was worked up and found to have another Urinary Tract Infection with E. Faecalis. Patient is steadily improving.  Assessment & Plan   Sepsis Secondary to Enterococcus Faecalis UTI with possible Gasteroenteritis -Sepsis physiology resolving -Recently got over Strep Throat -Patient met criteria given tachycardia, tachypnea, leukocytosis, and evidence of organ dysfunction.  -Lactate 2.44mol/L and repeat ordered within 6 hours and trended down to 1.1 -Sepsis bundle utilized -Urine Cx showed >100,000 CFU of Enterococcus Faecalis -Blood cultures 1/2 SCN- likely contaminent -WBC on Admission was 29.5 -> 9.1 today -IV Vancomycin and Fortaz D/C'd- Switched to IV Levaquin  Hyponatremia -Resolved, Na 142 -Continue to monitor BMP  Hypokalemia -Resolved, continue to monitor BMP -likely secondary to GI losses  NIDDM with Hyperglycemia -Not currently on antiglycemics. -HgbA1c 7.1 on 11/12/2016 -C/w Sensitive Novolog SSI AC  and HS -CBG ranging from 111-144  Pre-renal Azotemia -Resolved, Mild elevated BUN in setting of clinical dehydration. -Creatinine peaked at 1.16, currently 0.56 -Continue to monitor BMP  Normocytic Anemia  -Baseline 12-13. No history to suggest blood loss. -Hemoglobin dropped to 8.6, likely dilutional as patient received IVF for sepsis -Continue to monitor CBC  Mood Disorder -Continue Latuda, Venlafaxine, Buspirone -Continue Ativan 0.5 mg QHS  Seizure disorder -Continue Vimpat 100 mg po BID and Keppra 1000 mg po BID  Essential hypertension -Hold Lisinopril until fluid resuscitated  GERD -Continue PPI with Protonix 40 mg po Daily  Mildly Elevated Troponins -Likely from Sepsis Physiology -Denied CP -Tropoinin now <0.03  DVT Prophylaxis  lovenox  Code Status: Full  Family Communication: None at bedside  Disposition Plan: Admitted.   Consultants None  Procedures  none  Antibiotics   Anti-infectives    Start     Dose/Rate Route Frequency Ordered Stop   12/06/16 1400  levofloxacin (LEVAQUIN) IVPB 750 mg     750 mg 100 mL/hr over 90 Minutes Intravenous Every 24 hours 12/06/16 1247     12/05/16 1200  vancomycin (VANCOCIN) IVPB 750 mg/150 ml premix  Status:  Discontinued     750 mg 150 mL/hr over 60 Minutes Intravenous Every 12 hours 12/05/16 0121 12/05/16 1513   12/05/16 0200  vancomycin (VANCOCIN) IVPB 1000 mg/200 mL premix  Status:  Discontinued     1,000 mg 200 mL/hr over 60 Minutes Intravenous  Once 12/05/16 0145 12/05/16 0154   12/05/16 0130  vancomycin (VANCOCIN) IVPB 1000 mg/200 mL premix     1,000 mg 200 mL/hr over 60 Minutes Intravenous  Once 12/05/16 0121 12/05/16 0511   12/05/16 0130  cefTAZidime (FORTAZ)  1 g in dextrose 5 % 50 mL IVPB  Status:  Discontinued     1 g 100 mL/hr over 30 Minutes Intravenous Every 8 hours 12/05/16 0121 12/06/16 1247   12/04/16 2300  meropenem (MERREM) 1 g in sodium chloride 0.9 % 100 mL IVPB  Status:  Discontinued      1 g 200 mL/hr over 30 Minutes Intravenous Every 8 hours 12/04/16 2243 12/05/16 0115   12/04/16 2230  levofloxacin (LEVAQUIN) IVPB 750 mg  Status:  Discontinued     750 mg 100 mL/hr over 90 Minutes Intravenous  Once 12/04/16 2229 12/04/16 2241      Subjective:   Brandi [name] seen and examined today.  Denies further nausea, vomiting, or diarrhea. Has been able to tolerate her diet.  Denis chest pain, shortness of breath, dizziness, headache. She is hopeful that she can go home for Christmas.   Objective:   Vitals:   12/06/16 0700 12/06/16 1435 12/06/16 2019 12/07/16 0509  BP: 126/63 (!) 97/59 (!) 114/49 (!) 142/71  Pulse: (!) 101 (!) 102 94 98  Resp: '15 15 16 16  ' Temp: 97.9 F (36.6 C) 98.7 F (37.1 C) 98.3 F (36.8 C) 98.6 F (37 C)  TempSrc: Oral  Oral Oral  SpO2: 91% 94% 98% 94%  Weight:      Height:        Intake/Output Summary (Last 24 hours) at 12/07/16 1222 Last data filed at 12/07/16 0900  Gross per 24 hour  Intake              480 ml  Output                0 ml  Net              480 ml   Filed Weights   12/04/16 1926 12/04/16 2225  Weight: 86.2 kg (190 lb) 86.2 kg (190 lb)    Exam  General: Well developed, well nourished, NAD, appears stated age  HEENT: NCAT, mucous membranes moist.   Cardiovascular: S1 S2 auscultated, no rubs, murmurs or gallops. Regular rate and rhythm.  Respiratory: Clear to auscultation bilaterally with equal chest rise  Abdomen: Soft, nontender, nondistended, + bowel sounds  Extremities: warm dry without cyanosis clubbing or edema  Neuro: AAOx3, nonfocal  Psych: Normal affect and demeanor    Data Reviewed: I have personally reviewed following labs and imaging studies  CBC:  Recent Labs Lab 12/04/16 1944 12/05/16 0208 12/06/16 0437 12/07/16 0704  WBC 29.5* 24.5* 14.1* 9.1  NEUTROABS  --   --  10.3* 6.0  HGB 11.4* 9.8* 8.6* 8.9*  HCT 35.6* 29.7* 26.9* 27.9*  MCV 80.7 80.5 83.3 82.8  PLT 488* 426* 328 893    Basic Metabolic Panel:  Recent Labs Lab 12/04/16 1944 12/05/16 0208 12/06/16 0437 12/07/16 0704  NA 133* 135 140 142  K 3.0* 3.2* 4.4 3.6  CL 97* 102 112* 111  CO2 '25 24 23 ' 21*  GLUCOSE 219* 172* 124* 116*  BUN 64* 46* 11 5*  CREATININE 1.16* 0.90 0.72 0.56  CALCIUM 9.2 8.1* 8.4* 8.6*  MG  --  2.1 2.1 1.9  PHOS  --   --  1.4* 2.4*   GFR: Estimated Creatinine Clearance: 73.7 mL/min (by C-G formula based on SCr of 0.56 mg/dL). Liver Function Tests:  Recent Labs Lab 12/04/16 1944 12/05/16 0208 12/06/16 0437 12/07/16 0704  AST 54* 44* 40 38  ALT 49 41 32 39  ALKPHOS 91 76  69 76  BILITOT 0.6 0.5 0.7 0.7  PROT 6.6 5.5* 5.1* 5.6*  ALBUMIN 3.7 3.2* 2.8* 2.9*    Recent Labs Lab 12/04/16 1944  LIPASE 11   No results for input(s): AMMONIA in the last 168 hours. Coagulation Profile: No results for input(s): INR, PROTIME in the last 168 hours. Cardiac Enzymes:  Recent Labs Lab 12/05/16 0208 12/05/16 1529 12/05/16 2037 12/06/16 0437  TROPONINI 0.03* 0.05* 0.03* <0.03   BNP (last 3 results) No results for input(s): PROBNP in the last 8760 hours. HbA1C: No results for input(s): HGBA1C in the last 72 hours. CBG:  Recent Labs Lab 12/06/16 0650 12/06/16 1530 12/06/16 2132 12/07/16 0553 12/07/16 1118  GLUCAP 117* 144* 90 117* 109*   Lipid Profile: No results for input(s): CHOL, HDL, LDLCALC, TRIG, CHOLHDL, LDLDIRECT in the last 72 hours. Thyroid Function Tests: No results for input(s): TSH, T4TOTAL, FREET4, T3FREE, THYROIDAB in the last 72 hours. Anemia Panel: No results for input(s): VITAMINB12, FOLATE, FERRITIN, TIBC, IRON, RETICCTPCT in the last 72 hours. Urine analysis:    Component Value Date/Time   COLORURINE YELLOW 12/05/2016 0125   APPEARANCEUR CLEAR 12/05/2016 0125   APPEARANCEUR Cloudy 06/20/2013 1745   LABSPEC 1.041 (H) 12/05/2016 0125   LABSPEC 1.012 06/20/2013 1745   PHURINE 6.0 12/05/2016 0125   GLUCOSEU NEGATIVE 12/05/2016 0125    GLUCOSEU Negative 06/20/2013 1745   HGBUR NEGATIVE 12/05/2016 0125   BILIRUBINUR NEGATIVE 12/05/2016 0125   BILIRUBINUR n 05/17/2015 1425   BILIRUBINUR Negative 06/20/2013 1745   KETONESUR NEGATIVE 12/05/2016 0125   PROTEINUR NEGATIVE 12/05/2016 0125   UROBILINOGEN negative 05/17/2015 1425   UROBILINOGEN 0.2 06/29/2013 1530   NITRITE POSITIVE (A) 12/05/2016 0125   LEUKOCYTESUR MODERATE (A) 12/05/2016 0125   LEUKOCYTESUR 3+ 06/20/2013 1745   Sepsis Labs: '@LABRCNTIP' (procalcitonin:4,lacticidven:4)  ) Recent Results (from the past 240 hour(s))  Urine culture     Status: Abnormal   Collection Time: 12/04/16  1:25 AM  Result Value Ref Range Status   Specimen Description URINE, CLEAN CATCH  Final   Special Requests NONE  Final   Culture >=100,000 COLONIES/mL ENTEROCOCCUS FAECALIS (A)  Final   Report Status 12/07/2016 FINAL  Final   Organism ID, Bacteria ENTEROCOCCUS FAECALIS (A)  Final      Susceptibility   Enterococcus faecalis - MIC*    AMPICILLIN <=2 SENSITIVE Sensitive     LEVOFLOXACIN 1 SENSITIVE Sensitive     NITROFURANTOIN <=16 SENSITIVE Sensitive     VANCOMYCIN 1 SENSITIVE Sensitive     * >=100,000 COLONIES/mL ENTEROCOCCUS FAECALIS  Blood Culture (routine x 2)     Status: None (Preliminary result)   Collection Time: 12/04/16 10:22 PM  Result Value Ref Range Status   Specimen Description BLOOD RIGHT ARM  Final   Special Requests IN PEDIATRIC BOTTLE 4ML  Final   Culture NO GROWTH 2 DAYS  Final   Report Status PENDING  Incomplete  Blood Culture (routine x 2)     Status: Abnormal   Collection Time: 12/04/16 11:29 PM  Result Value Ref Range Status   Specimen Description BLOOD LEFT HAND  Final   Special Requests IN PEDIATRIC BOTTLE 2ML  Final   Culture  Setup Time   Final    GRAM POSITIVE COCCI IN CLUSTERS AEROBIC BOTTLE ONLY CRITICAL RESULT CALLED TO, READ BACK BY AND VERIFIED WITH: G ABBOTT PHARMD 2257 12/05/16 A BROWNING    Culture (A)  Final    STAPHYLOCOCCUS SPECIES  (COAGULASE NEGATIVE) THE SIGNIFICANCE OF ISOLATING THIS  ORGANISM FROM A SINGLE SET OF BLOOD CULTURES WHEN MULTIPLE SETS ARE DRAWN IS UNCERTAIN. PLEASE NOTIFY THE MICROBIOLOGY DEPARTMENT WITHIN ONE WEEK IF SPECIATION AND SENSITIVITIES ARE REQUIRED.    Report Status 12/06/2016 FINAL  Final  Blood Culture ID Panel (Reflexed)     Status: Abnormal   Collection Time: 12/04/16 11:29 PM  Result Value Ref Range Status   Enterococcus species NOT DETECTED NOT DETECTED Final   Listeria monocytogenes NOT DETECTED NOT DETECTED Final   Staphylococcus species DETECTED (A) NOT DETECTED Final    Comment: CRITICAL RESULT CALLED TO, READ BACK BY AND VERIFIED WITH: G ABBOTT PHARMD 2257 12/05/16 A BROWNING    Staphylococcus aureus NOT DETECTED NOT DETECTED Final   Methicillin resistance NOT DETECTED NOT DETECTED Final   Streptococcus species NOT DETECTED NOT DETECTED Final   Streptococcus agalactiae NOT DETECTED NOT DETECTED Final   Streptococcus pneumoniae NOT DETECTED NOT DETECTED Final   Streptococcus pyogenes NOT DETECTED NOT DETECTED Final   Acinetobacter baumannii NOT DETECTED NOT DETECTED Final   Enterobacteriaceae species NOT DETECTED NOT DETECTED Final   Enterobacter cloacae complex NOT DETECTED NOT DETECTED Final   Escherichia coli NOT DETECTED NOT DETECTED Final   Klebsiella oxytoca NOT DETECTED NOT DETECTED Final   Klebsiella pneumoniae NOT DETECTED NOT DETECTED Final   Proteus species NOT DETECTED NOT DETECTED Final   Serratia marcescens NOT DETECTED NOT DETECTED Final   Haemophilus influenzae NOT DETECTED NOT DETECTED Final   Neisseria meningitidis NOT DETECTED NOT DETECTED Final   Pseudomonas aeruginosa NOT DETECTED NOT DETECTED Final   Candida albicans NOT DETECTED NOT DETECTED Final   Candida glabrata NOT DETECTED NOT DETECTED Final   Candida krusei NOT DETECTED NOT DETECTED Final   Candida parapsilosis NOT DETECTED NOT DETECTED Final   Candida tropicalis NOT DETECTED NOT DETECTED Final        Radiology Studies: No results found.   Scheduled Meds: . aspirin EC  81 mg Oral Daily  . atorvastatin  20 mg Oral q1800  . busPIRone  30 mg Oral BID  . enoxaparin (LOVENOX) injection  40 mg Subcutaneous Q24H  . insulin aspart  0-5 Units Subcutaneous QHS  . insulin aspart  0-9 Units Subcutaneous TID WC  . lacosamide  100 mg Oral BID  . levETIRAcetam  1,000 mg Oral BID  . levofloxacin (LEVAQUIN) IV  750 mg Intravenous Q24H  . LORazepam  0.5 mg Oral QPM  . lurasidone  120 mg Oral Daily  . pantoprazole  40 mg Oral Daily  . venlafaxine XR  150 mg Oral BID   Continuous Infusions:   LOS: 2 days   Time Spent in minutes   30 minutes  Viviano Bir D.O. on 12/07/2016 at 12:22 PM  Between 7am to 7pm - Pager - [phone]  After 7pm go to [email or web address] - password [email or web address]  And look for the night coverage person covering for me after hours  [facility] Office  [phone]

## 2016-12-07 NOTE — Evaluation (Signed)
Physical Therapy Evaluation Patient Details Name: Brandi [name] MRN: [id number] DOB: May 19, 1956 Today's Date: 12/07/2016   History of Present Illness  Brandi [name] is a 60 y.o. female with a past medical history significant for severe depression, NIDDM, HTN, and seizures who presents with N/V/D for 3 days.The patient was in her usual state of health until Monday when she developed nausea, repeat NBNB vomiting and loose non-bloody stools.  She was out to eat with family over the weekend, and reportedly maybe another family member was also vomiting.  She saw her PCP and  was given ondansetron, but since then has gotten worse, hasn't been able to keep anything down, has had subjective fevers, and is now weak and somewhat confused, so family brought her to the ER.  There was no dyspnea, cough, sputum.  She has had UTI and pyelonephritis in the past, but there has been no flank pain, foul urine, irritative voiding symptoms. She was worked up and found to have another Urinary Tract Infection.  Clinical Impression  Pt admitted with above diagnosis. Pt currently with functional limitations due to the deficits listed below (see PT Problem List). Pt was able to ambulate without device but possibly would have been safer with RW.  Pt did not want to use RW and refused.  Pt has some safety awareness issues.  Pt is possibly at her baseline but no family here to confirm this.  Will follow and progress pt as able. Recommend HHPT safety eval and RW and 3N1 if pt will agree.   Pt will benefit from skilled PT to increase their independence and safety with mobility to allow discharge to the venue listed below.      Follow Up Recommendations Home health PT;Supervision/Assistance - 24 hour (safety eval)    Equipment Recommendations  Rolling walker with 5" wheels;3in1 (PT) (pt declines need for equipment)    Recommendations for Other Services       Precautions / Restrictions Precautions Precautions:  Fall Restrictions Weight Bearing Restrictions: No      Mobility  Bed Mobility Overal bed mobility: Independent                Transfers Overall transfer level: Independent                  Ambulation/Gait Ambulation/Gait assistance: Supervision;Min guard Ambulation Distance (Feet): 200 Feet Assistive device: None Gait Pattern/deviations: Step-through pattern;Decreased stride length;Drifts right/left   Gait velocity interpretation: Below normal speed for age/gender General Gait Details: Pt was able to ambulate in halls.  Given slight challenges and can keep balance with slight challenges.  When challenged more, needed min guard assist for steadying.  Pt states that she is unsteady at baseline and may very well be close to her baseline. Would not use RW even though PT told her it may help her.  Pt went to bathroom and was able to clean herself but did need a little help putting on Depends. She did get her wet ones off.   Stairs            Wheelchair Mobility    Modified Rankin (Stroke Patients Only)       Balance Overall balance assessment: Needs assistance;History of Falls Sitting-balance support: No upper extremity supported;Feet supported Sitting balance-Leahy Scale: Good     Standing balance support: No upper extremity supported;During functional activity Standing balance-Leahy Scale: Fair Standing balance comment: can stand and clean herself after usijng bathroom.  Also pt washed hands at sink without  assist.               High level balance activites: Direction changes;Turns;Sudden stops High Level Balance Comments: Needed min guard assist with challenges.              Pertinent Vitals/Pain Pain Assessment: No/denies pain  VSS    Home Living Family/patient expects to be discharged to:: Private residence Living Arrangements: Children;Spouse/significant other Available Help at Discharge: Family;Available 24 hours/day Type of Home:  House Home Access: Level entry     Home Layout: One level Home Equipment: None Additional Comments: Pt reports she has fallen but it was a while ago.    Prior Function Level of Independence: Independent         Comments: Pt has cat and dog.       Hand Dominance        Extremity/Trunk Assessment   Upper Extremity Assessment Upper Extremity Assessment: Defer to OT evaluation    Lower Extremity Assessment Lower Extremity Assessment: Generalized weakness    Cervical / Trunk Assessment Cervical / Trunk Assessment: Normal  Communication   Communication: No difficulties  Cognition Arousal/Alertness: Awake/alert Behavior During Therapy: WFL for tasks assessed/performed Overall Cognitive Status: Within Functional Limits for tasks assessed                      General Comments      Exercises     Assessment/Plan    PT Assessment Patient needs continued PT services  PT Problem List Decreased activity tolerance;Decreased balance;Decreased mobility;Decreased knowledge of use of DME;Decreased safety awareness;Decreased knowledge of precautions          PT Treatment Interventions DME instruction;Gait training;Functional mobility training;Therapeutic activities;Therapeutic exercise;Balance training;Patient/family education    PT Goals (Current goals can be found in the Care Plan section)  Acute Rehab PT Goals Patient Stated Goal: to go home PT Goal Formulation: With patient Time For Goal Achievement: 12/21/16 Potential to Achieve Goals: Good    Frequency Min 3X/week   Barriers to discharge Decreased caregiver support (pt states she may be alone occasionally)      Co-evaluation               End of Session Equipment Utilized During Treatment: Gait belt Activity Tolerance: Patient limited by fatigue Patient left: in bed;with call bell/phone within reach;with bed alarm set Nurse Communication: Mobility status         Time: [id number] PT Time  Calculation (min) (ACUTE ONLY): 17 min   Charges:   PT Evaluation $PT Eval Moderate Complexity: 1 Procedure     PT G Codes:        Brandi [name] 31-Dec-2016, 1:49 PM Brandi [name],PT Acute Rehabilitation [phone] (pager)

## 2016-12-08 DIAGNOSIS — D649 Anemia, unspecified: Secondary | ICD-10-CM

## 2016-12-08 LAB — BASIC METABOLIC PANEL
ANION GAP: 7 (ref 5–15)
BUN: 6 mg/dL (ref 6–20)
CALCIUM: 8.6 mg/dL — AB (ref 8.9–10.3)
CO2: 25 mmol/L (ref 22–32)
CREATININE: 0.6 mg/dL (ref 0.44–1.00)
Chloride: 107 mmol/L (ref 101–111)
GFR calc non Af Amer: >60 mL/min (ref >60)
Glucose, Bld: 135 mg/dL — ABNORMAL HIGH (ref 65–99)
Potassium: 4 mmol/L (ref 3.5–5.1)
SODIUM: 139 mmol/L (ref 135–145)

## 2016-12-08 LAB — GLUCOSE, CAPILLARY: GLUCOSE-CAPILLARY: 118 mg/dL — AB (ref 65–99)

## 2016-12-08 LAB — HEMOGLOBIN AND HEMATOCRIT, BLOOD
HCT: 29 % — ABNORMAL LOW (ref 36.0–46.0)
HEMOGLOBIN: 9.3 g/dL — AB (ref 12.0–15.0)

## 2016-12-08 MED ORDER — LEVOFLOXACIN 750 MG PO TABS: 750.0000 mg | ORAL_TABLET | Freq: Every day | ORAL | 0 refills | Status: DC

## 2016-12-08 MED ORDER — ASPIRIN 81 MG PO TBEC: 81.0000 mg | DELAYED_RELEASE_TABLET | Freq: Every day | ORAL | 0 refills | Status: DC

## 2016-12-08 NOTE — Progress Notes (Signed)
Contacted Jamaine at [facility] for [place] referral.

## 2016-12-08 NOTE — Discharge Instructions (Signed)
Dehydration, Adult Dehydration is when there is not enough fluid or water in your body. This happens when you lose more fluids than you take in. Dehydration can range from mild to very bad. It should be treated right away to keep it from getting very bad. Symptoms of mild dehydration may include:  Thirst.  Dry lips.  Slightly dry mouth.  Dry, warm skin.  Dizziness. Symptoms of moderate dehydration may include:  Very dry mouth.  Muscle cramps.  Dark pee (urine). Pee may be the color of tea.  Your body making less pee.  Your eyes making fewer tears.  Heartbeat that is uneven or faster than normal (palpitations).  Headache.  Light-headedness, especially when you stand up from sitting.  Fainting (syncope). Symptoms of very bad dehydration may include:  Changes in skin, such as:  Cold and clammy skin.  Blotchy (mottled) or pale skin.  Skin that does not quickly return to normal after being lightly pinched and let go (poor skin turgor).  Changes in body fluids, such as:  Feeling very thirsty.  Your eyes making fewer tears.  Not sweating when body temperature is high, such as in hot weather.  Your body making very little pee.  Changes in vital signs, such as:  Weak pulse.  Pulse that is more than 100 beats a minute when you are sitting still.  Fast breathing.  Low blood pressure.  Other changes, such as:  Sunken eyes.  Cold hands and feet.  Confusion.  Lack of energy (lethargy).  Trouble waking up from sleep.  Short-term weight loss.  Unconsciousness. Follow these instructions at home:  If told by your doctor, drink an ORS:  Make an ORS by using instructions on the package.  Start by drinking small amounts, about  cup (120 mL) every 5-10 minutes.  Slowly drink more until you have had the amount that your doctor said to have.  Drink enough clear fluid to keep your pee clear or pale yellow. If you were told to drink an ORS, finish the ORS  first, then start slowly drinking clear fluids. Drink fluids such as:  Water. Do not drink only water by itself. Doing that can make the salt (sodium) level in your body get too low (hyponatremia).  Ice chips.  Fruit juice that you have added water to (diluted).  Low-calorie sports drinks.  Avoid:  Alcohol.  Drinks that have a lot of sugar. These include high-calorie sports drinks, fruit juice that does not have water added, and soda.  Caffeine.  Foods that are greasy or have a lot of fat or sugar.  Take over-the-counter and prescription medicines only as told by your doctor.  Do not take salt tablets. Doing that can make the salt level in your body get too high (hypernatremia).  Eat foods that have minerals (electrolytes). Examples include bananas, oranges, potatoes, tomatoes, and spinach.  Keep all follow-up visits as told by your doctor. This is important. Contact a doctor if:  You have belly (abdominal) pain that:  Gets worse.  Stays in one area (localizes).  You have a rash.  You have a stiff neck.  You get angry or annoyed more easily than normal (irritability).  You are more sleepy than normal.  You have a harder time waking up than normal.  You feel:  Weak.  Dizzy.  Very thirsty.  You have peed (urinated) only a small amount of very dark pee during 6-8 hours. Get help right away if:  You have symptoms of  very bad dehydration.  You cannot drink fluids without throwing up (vomiting).  Your symptoms get worse with treatment.  You have a fever.  You have a very bad headache.  You are throwing up or having watery poop (diarrhea) and it:  Gets worse.  Does not go away.  You have blood or something green (bile) in your throw-up.  You have blood in your poop (stool). This may cause poop to look black and tarry.  You have not peed in 6-8 hours.  You pass out (faint).  Your heart rate when you are sitting still is more than 100 beats a  minute.  You have trouble breathing. This information is not intended to replace advice given to you by your health care provider. Make sure you discuss any questions you have with your health care provider. Document Released: 09/28/2009 Document Revised: 06/21/2016 Document Reviewed: 01/26/2016 Elsevier Interactive Patient Education  2017 [employer].  Urinary Tract Infection, Adult A urinary tract infection (UTI) is an infection of any part of the urinary tract, which includes the kidneys, ureters, bladder, and urethra. These organs make, store, and get rid of urine in the body. UTI can be a bladder infection (cystitis) or kidney infection (pyelonephritis). What are the causes? This infection may be caused by fungi, viruses, or bacteria. Bacteria are the most common cause of UTIs. This condition can also be caused by repeated incomplete emptying of the bladder during urination. What increases the risk? This condition is more likely to develop if:  You ignore your need to urinate or hold urine for long periods of time.  You do not empty your bladder completely during urination.  You wipe back to front after urinating or having a bowel movement, if you are female.  You are uncircumcised, if you are female.  You are constipated.  You have a urinary catheter that stays in place (indwelling).  You have a weak defense (immune) system.  You have a medical condition that affects your bowels, kidneys, or bladder.  You have diabetes.  You take antibiotic medicines frequently or for long periods of time, and the antibiotics no longer work well against certain types of infections (antibiotic resistance).  You take medicines that irritate your urinary tract.  You are exposed to chemicals that irritate your urinary tract.  You are female. What are the signs or symptoms? Symptoms of this condition include:  Fever.  Frequent urination or passing small amounts of urine  frequently.  Needing to urinate urgently.  Pain or burning with urination.  Urine that smells bad or unusual.  Cloudy urine.  Pain in the lower abdomen or back.  Trouble urinating.  Blood in the urine.  Vomiting or being less hungry than normal.  Diarrhea or abdominal pain.  Vaginal discharge, if you are female. How is this diagnosed? This condition is diagnosed with a medical history and physical exam. You will also need to provide a urine sample to test your urine. Other tests may be done, including:  Blood tests.  Sexually transmitted disease (STD) testing. If you have had more than one UTI, a cystoscopy or imaging studies may be done to determine the cause of the infections. How is this treated? Treatment for this condition often includes a combination of two or more of the following:  Antibiotic medicine.  Other medicines to treat less common causes of UTI.  Over-the-counter medicines to treat pain.  Drinking enough water to stay hydrated. Follow these instructions at home:  Take over-the-counter  and prescription medicines only as told by your health care provider.  If you were prescribed an antibiotic, take it as told by your health care provider. Do not stop taking the antibiotic even if you start to feel better.  Avoid alcohol, caffeine, tea, and carbonated beverages. They can irritate your bladder.  Drink enough fluid to keep your urine clear or pale yellow.  Keep all follow-up visits as told by your health care provider. This is important.  Make sure to:  Empty your bladder often and completely. Do not hold urine for long periods of time.  Empty your bladder before and after sex.  Wipe from front to back after a bowel movement if you are female. Use each tissue one time when you wipe. Contact a health care provider if:  You have back pain.  You have a fever.  You feel nauseous or vomit.  Your symptoms do not get better after 3 days.  Your  symptoms go away and then return. Get help right away if:  You have severe back pain or lower abdominal pain.  You are vomiting and cannot keep down any medicines or water. This information is not intended to replace advice given to you by your health care provider. Make sure you discuss any questions you have with your health care provider. Document Released: 09/11/2005 Document Revised: 05/15/2016 Document Reviewed: 10/23/2015 Elsevier Interactive Patient Education  2017 [employer].

## 2016-12-08 NOTE — Care Management Note (Signed)
60 yo F with sepsis secondary to Enterococcus Faecalis UTI with possible gasteroenteritis  Per RN, PT is recommending HHPT and DME  Met with pt and husband. D/C plan is for pt to return home with the support of her husband. Discussed PT recommendations. She has a walker. She declined the 3-in-1 [facility]. She stated that the bathroom is near her bedroom. They don't have a preference for a [place] agency. Provided husband with a list of [place] agencies. They want to use [facility]. She stated that her mother used them before and they were nice. Will contact Jamaine at [facility] for referral once MD completes face to face.

## 2016-12-08 NOTE — Progress Notes (Signed)
Foley catheter that was placed in the OR was removed this a.m. At 0630.

## 2016-12-08 NOTE — Progress Notes (Signed)
Pt ready for d/c home today per MD. Discharge instructions and prescriptions reviewed with pt and husband, all questions answered. Peripheral IVs removed, tele d/c'ed. Belongings packed and sent with pt. Assisted to car via NT in wheelchair. [place], Jerry Caras

## 2016-12-08 NOTE — Discharge Summary (Signed)
Physician Discharge Summary  Brandi [name] RWE:[id number] DOB: 1956/09/28 DOA: 12/04/2016  PCP: [name], MD  Admit date: 12/04/2016 Discharge date: 12/08/2016  Time spent: 45 minutes  Recommendations for Outpatient Follow-up:  Patient will be discharged to home with home health PT.  Patient will need to follow up with primary care provider within one week of discharge, repeat CBC and discuss diabetes management. Patient should continue medications as prescribed.  Patient should follow a Heart healthy/carb modified  diet.   Discharge Diagnoses:  Sepsis Secondary to Enterococcus Faecalis UTI with possible Gasteroenteritis Hyponatremia Hypokalemia NIDDM with Hyperglycemia Pre-renal Azotemia Normocytic Anemia  Mood Disorder Seizure disorder Essential hypertension GERD Mildly Elevated Troponins  Discharge Condition: Stable  Diet recommendation: Heart healthy/carb modified   Filed Weights   12/04/16 1926 12/04/16 2225  Weight: 86.2 kg (190 lb) 86.2 kg (190 lb)    History of present illness:  On 12/05/2016 By Dr. Myrene Buddy Brandi [name] is a 60 y.o. female with a past medical history significant for severe depression, NIDDM, HTN, and seizures who presents with N/V/D for 3 days.  The patient was in her usual state of health until Monday when she developed nausea, repeat NBNB vomiting and loose non-bloody stools.  She was out to eat with family over the weekend, and reportedly maybe another family member was also vomiting.  She saw her PCP yesterday and was given ondansetron, but since then has gotten worse, hasn't been able to keep anything down, has had subjective fevers, and is now weak and somewhat confused, so family brought her to the ER.  There was no dyspnea, cough, sputum.  She has had UTI and pyelonephritis in the past, but there has been no flank pain, foul urine, irritative voiding symptoms.  Hospital Course:  Sepsis Secondary to  Enterococcus Faecalis UTI with possible Gasteroenteritis -Sepsis physiology resolving -Recently got over Strep Throat -Patient metcriteria given tachycardia, tachypnea, leukocytosis, and evidence of organ dysfunction.  -Lactate 2.43mol/L and repeat ordered within 6 hours and trended down to 1.1 -Sepsis bundle utilized -Urine Cx showed >100,000 CFU of Enterococcus Faecalis -Blood cultures 1/2 SCN- likely contaminent -WBC on Admission was 29.5 -> 9.1 -IV Vancomycin and Fortaz D/C'd- Switched to PO Levaquin  Hyponatremia -Resolved, Na 139  Hypokalemia -Resolved, continue to monitor BMP -likely secondary to GI losses  NIDDM with Hyperglycemia -Not currently on antiglycemics. -HgbA1c 7.1 on 11/12/2016- should speak to PCP regarding diabetes management -Was placed on Sensitive Novolog SSI AC and HS -CBG ranging from 111-144  Pre-renal Azotemia -Resolved, Mild elevated BUN in setting of clinical dehydration. -Creatinine peaked at 1.16, currently 0.60  Normocytic Anemia  -Baseline 12-13. No history to suggest blood loss. -Hemoglobin dropped to 8.6, likely dilutional as patient received IVF for sepsis -Currently 9.3 -Repeat CBC in one week  Mood Disorder -Continue Latuda, Venlafaxine, Buspirone -Continue Ativan 0.5 mgQHS  Seizure disorder -Continue Vimpat 100 mg po BIDand Keppra 1000 mg po BID  Essential hypertension -Hold Lisinopril until fluid resuscitated- restart upon discharge  GERD -Continue PPI with Protonix 40 mg po Daily  Mildly Elevated Troponins -Likely from Sepsis Physiology -Denied CP -Tropoinin now <0.03  Deconditioning -[facility] and [facility] None  Procedures  none  Discharge Exam: Vitals:   12/07/16 2009 12/08/16 0410  BP: (!) 152/91 139/79  Pulse: 93 91  Resp: 16 16  Temp: 98.6 F (37 C) 98.6 F (37 C)   Denies further nausea, vomiting, or diarrhea. Has been able to tolerate her  diet.  Denis chest  pain, shortness of breath, dizziness, headache. She is hopeful that she can go home for Christmas.   Exam  General: Well developed, well nourished, NAD, appears stated age  27: NCAT, mucous membranes moist.   Cardiovascular: S1 S2 auscultated, RRR, no murmurs  Respiratory: Clear to auscultation bilaterally with equal chest rise  Abdomen: Soft, nontender, nondistended, + bowel sounds  Extremities: warm dry without cyanosis clubbing or edema  Neuro: AAOx3, nonfocal  Psych: Normal affect and demeanor, pleasant  Discharge Instructions Discharge Instructions    Discharge instructions    Complete by:  As directed    Patient will be discharged to home with home health PT.  Patient will need to follow up with primary care provider within one week of discharge, repeat CBC and discuss diabetes management. Patient should continue medications as prescribed.  Patient should follow a Heart healthy/carb modified  diet.     Current Discharge Medication List    START taking these medications   Details  levofloxacin (LEVAQUIN) 750 MG tablet Take 1 tablet (750 mg total) by mouth daily. Qty: 5 tablet, Refills: 0      CONTINUE these medications which have CHANGED   Details  aspirin EC 81 MG EC tablet Take 1 tablet (81 mg total) by mouth daily. Qty: 30 tablet, Refills: 0      CONTINUE these medications which have NOT CHANGED   Details  atorvastatin (LIPITOR) 20 MG tablet TAKE 1 TABLET (20 MG TOTAL) BY MOUTH DAILY. Qty: 90 tablet, Refills: 3    busPIRone (BUSPAR) 30 MG tablet Take 30 mg by mouth 2 (two) times daily.    Lacosamide (VIMPAT) 100 MG TABS Take 1 tablet (100 mg total) by mouth 2 (two) times daily. Qty: 180 tablet, Refills: 3    LATUDA 120 MG TABS Take 1 tablet by mouth daily. Refills: 3    levETIRAcetam (KEPPRA) 500 MG tablet TAKE 2 TABLETS (1,000 MG TOTAL) BY MOUTH 2 (TWO) TIMES DAILY. Qty: 360 tablet, Refills: 3    LORazepam (ATIVAN) 0.5 MG tablet Take 0.5-1 mg by  mouth 2 (two) times daily. 1 tablet every morning, and 2 tablets at bedtime as needed    ondansetron (ZOFRAN-ODT) 4 MG disintegrating tablet Take 1 tablet (4 mg total) by mouth every 8 (eight) hours as needed for nausea or vomiting. Qty: 20 tablet, Refills: 0   Associated Diagnoses: Non-intractable vomiting with nausea, unspecified vomiting type    pantoprazole (PROTONIX) 40 MG tablet TAKE 1 TABLET (40 MG TOTAL) BY MOUTH DAILY. Qty: 30 tablet, Refills: 11    venlafaxine XR (EFFEXOR-XR) 150 MG 24 hr capsule Take 300 mg by mouth daily.  Refills: 11    calcium-vitamin D (OSCAL WITH D) 250-125 MG-UNIT per tablet Take 2 tablets by mouth daily. Qty: 30 tablet, Refills: 11   Associated Diagnoses: History of breast cancer    lisinopril (PRINIVIL,ZESTRIL) 5 MG tablet TAKE 1 TABLET (5 MG TOTAL) BY MOUTH DAILY. Qty: 90 tablet, Refills: 3       Allergies  Allergen Reactions  . Penicillins Rash    Has patient had a PCN reaction causing immediate rash, facial/tongue/throat swelling, SOB or lightheadedness with hypotension: No Has patient had a PCN reaction causing severe rash involving mucus membranes or skin necrosis: No Has patient had a PCN reaction that required hospitalization No Has patient had a PCN reaction occurring within the last 10 years: No If all of the above answers are "NO", then may proceed with Cephalosporin use.  Follow-up Information    [name], MD. Schedule an appointment as soon as possible for a visit in 1 week(s).   Specialty:  Family Medicine Why:  Hospital follow up Contact information: [place] [address] [phone]            The results of significant diagnostics from this hospitalization (including imaging, microbiology, ancillary and laboratory) are listed below for reference.    Significant Diagnostic Studies: Ct Abdomen Pelvis W Contrast  Result Date: 12/04/2016 CLINICAL DATA:  60 year old female with  leukocytosis, nausea vomiting and diarrhea. EXAM: CT ABDOMEN AND PELVIS WITH CONTRAST TECHNIQUE: Multidetector CT imaging of the abdomen and pelvis was performed using the standard protocol following bolus administration of intravenous contrast. CONTRAST:  1 ISOVUE-300 IOPAMIDOL (ISOVUE-300) INJECTION 61% COMPARISON:  Abdominal CT dated 06/29/2013 FINDINGS: Lower chest: The visualized lung bases are clear. No intra-abdominal free air or free fluid. Hepatobiliary: Cholecystectomy. Diffuse fatty infiltration of the liver. No intrahepatic biliary ductal dilatation. Pancreas: Unremarkable. No pancreatic ductal dilatation or surrounding inflammatory changes. Spleen: Normal in size without focal abnormality. Adrenals/Urinary Tract: The adrenal glands appear unremarkable. Small nonobstructing bilateral renal calculi measure up to 5 mm in the inferior pole of the right kidney. There is no hydronephrosis on either side. Subcentimeter bilateral renal hypodense lesions are too small to characterize but most likely represent cysts. The visualized ureters and urinary bladder appear unremarkable. Stomach/Bowel: There scattered sigmoid diverticula without active inflammatory changes. There is a small hiatal hernia with gastroesophageal reflux. There is circumferential thickened appearance of the distal esophagus concerning for esophagitis likely related to chronic irritation and reflux. There is no evidence of bowel obstruction. The appendix is not visualized with certainty. No inflammatory changes identified in the right lower quadrant. Vascular/Lymphatic: No significant vascular findings are present. No enlarged abdominal or pelvic lymph nodes. Reproductive: The uterus and ovaries are grossly unremarkable. Other: There is diastases of anterior abdominal wall musculature in the midline with a small fat containing umbilical hernia. No fluid collection. Musculoskeletal: Benign appearing sclerotic focus in the left sacral alar stop  similar to prior CT most compatible with a bone island. Multilevel facet degenerative changes. No acute fracture. IMPRESSION: Thickened appearance of the distal esophagus concerning for esophagitis likely related to chronic irritation and reflux. No definite acute intra- abdominopelvic pathology. Small nonobstructing bilateral renal calculi.  No hydronephrosis. Small scattered sigmoid diverticula. Electronically Signed   By: Anner Crete M.D.   On: 12/04/2016 23:28    Microbiology: Recent Results (from the past 240 hour(s))  Urine culture     Status: Abnormal   Collection Time: 12/04/16  1:25 AM  Result Value Ref Range Status   Specimen Description URINE, CLEAN CATCH  Final   Special Requests NONE  Final   Culture >=100,000 COLONIES/mL ENTEROCOCCUS FAECALIS (A)  Final   Report Status 12/07/2016 FINAL  Final   Organism ID, Bacteria ENTEROCOCCUS FAECALIS (A)  Final      Susceptibility   Enterococcus faecalis - MIC*    AMPICILLIN <=2 SENSITIVE Sensitive     LEVOFLOXACIN 1 SENSITIVE Sensitive     NITROFURANTOIN <=16 SENSITIVE Sensitive     VANCOMYCIN 1 SENSITIVE Sensitive     * >=100,000 COLONIES/mL ENTEROCOCCUS FAECALIS  Blood Culture (routine x 2)     Status: None (Preliminary result)   Collection Time: 12/04/16 10:22 PM  Result Value Ref Range Status   Specimen Description BLOOD RIGHT ARM  Final   Special Requests IN PEDIATRIC BOTTLE 4ML  Final  Culture NO GROWTH 4 DAYS  Final   Report Status PENDING  Incomplete  Blood Culture (routine x 2)     Status: Abnormal   Collection Time: 12/04/16 11:29 PM  Result Value Ref Range Status   Specimen Description BLOOD LEFT HAND  Final   Special Requests IN PEDIATRIC BOTTLE 2ML  Final   Culture  Setup Time   Final    GRAM POSITIVE COCCI IN CLUSTERS AEROBIC BOTTLE ONLY CRITICAL RESULT CALLED TO, READ BACK BY AND VERIFIED WITH: G ABBOTT PHARMD 2257 12/05/16 A BROWNING    Culture (A)  Final    STAPHYLOCOCCUS SPECIES (COAGULASE  NEGATIVE) THE SIGNIFICANCE OF ISOLATING THIS ORGANISM FROM A SINGLE SET OF BLOOD CULTURES WHEN MULTIPLE SETS ARE DRAWN IS UNCERTAIN. PLEASE NOTIFY THE MICROBIOLOGY DEPARTMENT WITHIN ONE WEEK IF SPECIATION AND SENSITIVITIES ARE REQUIRED.    Report Status 12/06/2016 FINAL  Final  Blood Culture ID Panel (Reflexed)     Status: Abnormal   Collection Time: 12/04/16 11:29 PM  Result Value Ref Range Status   Enterococcus species NOT DETECTED NOT DETECTED Final   Listeria monocytogenes NOT DETECTED NOT DETECTED Final   Staphylococcus species DETECTED (A) NOT DETECTED Final    Comment: CRITICAL RESULT CALLED TO, READ BACK BY AND VERIFIED WITH: G ABBOTT PHARMD 2257 12/05/16 A BROWNING    Staphylococcus aureus NOT DETECTED NOT DETECTED Final   Methicillin resistance NOT DETECTED NOT DETECTED Final   Streptococcus species NOT DETECTED NOT DETECTED Final   Streptococcus agalactiae NOT DETECTED NOT DETECTED Final   Streptococcus pneumoniae NOT DETECTED NOT DETECTED Final   Streptococcus pyogenes NOT DETECTED NOT DETECTED Final   Acinetobacter baumannii NOT DETECTED NOT DETECTED Final   Enterobacteriaceae species NOT DETECTED NOT DETECTED Final   Enterobacter cloacae complex NOT DETECTED NOT DETECTED Final   Escherichia coli NOT DETECTED NOT DETECTED Final   Klebsiella oxytoca NOT DETECTED NOT DETECTED Final   Klebsiella pneumoniae NOT DETECTED NOT DETECTED Final   Proteus species NOT DETECTED NOT DETECTED Final   Serratia marcescens NOT DETECTED NOT DETECTED Final   Haemophilus influenzae NOT DETECTED NOT DETECTED Final   Neisseria meningitidis NOT DETECTED NOT DETECTED Final   Pseudomonas aeruginosa NOT DETECTED NOT DETECTED Final   Candida albicans NOT DETECTED NOT DETECTED Final   Candida glabrata NOT DETECTED NOT DETECTED Final   Candida krusei NOT DETECTED NOT DETECTED Final   Candida parapsilosis NOT DETECTED NOT DETECTED Final   Candida tropicalis NOT DETECTED NOT DETECTED Final      Labs: Basic Metabolic Panel:  Recent Labs Lab 12/04/16 1944 12/05/16 0208 12/06/16 0437 12/07/16 0704 12/08/16 0620  NA 133* 135 140 142 139  K 3.0* 3.2* 4.4 3.6 4.0  CL 97* 102 112* 111 107  CO2 '25 24 23 ' 21* 25  GLUCOSE 219* 172* 124* 116* 135*  BUN 64* 46* 11 5* 6  CREATININE 1.16* 0.90 0.72 0.56 0.60  CALCIUM 9.2 8.1* 8.4* 8.6* 8.6*  MG  --  2.1 2.1 1.9  --   PHOS  --   --  1.4* 2.4*  --    Liver Function Tests:  Recent Labs Lab 12/04/16 1944 12/05/16 0208 12/06/16 0437 12/07/16 0704  AST 54* 44* 40 38  ALT 49 41 32 39  ALKPHOS 91 76 69 76  BILITOT 0.6 0.5 0.7 0.7  PROT 6.6 5.5* 5.1* 5.6*  ALBUMIN 3.7 3.2* 2.8* 2.9*    Recent Labs Lab 12/04/16 1944  LIPASE 11   No results for input(s): AMMONIA  in the last 168 hours. CBC:  Recent Labs Lab 12/04/16 1944 12/05/16 0208 12/06/16 0437 12/07/16 0704 12/08/16 0620  WBC 29.5* 24.5* 14.1* 9.1  --   NEUTROABS  --   --  10.3* 6.0  --   HGB 11.4* 9.8* 8.6* 8.9* 9.3*  HCT 35.6* 29.7* 26.9* 27.9* 29.0*  MCV 80.7 80.5 83.3 82.8  --   PLT 488* 426* 328 353  --    Cardiac Enzymes:  Recent Labs Lab 12/05/16 0208 12/05/16 1529 12/05/16 2037 12/06/16 0437  TROPONINI 0.03* 0.05* 0.03* <0.03   BNP: BNP (last 3 results) No results for input(s): BNP in the last 8760 hours.  ProBNP (last 3 results) No results for input(s): PROBNP in the last 8760 hours.  CBG:  Recent Labs Lab 12/07/16 0553 12/07/16 1118 12/07/16 1606 12/07/16 2045 12/08/16 0700  GLUCAP 117* 109* 110* 107* 118*       Signed:  Kristjan Derner  [facility] 12/08/2016, 11:58 AM

## 2016-12-09 LAB — CULTURE, BLOOD (ROUTINE X 2): CULTURE: NO GROWTH

## 2016-12-11 LAB — GLUCOSE, CAPILLARY: Glucose-Capillary: 137 mg/dL — ABNORMAL HIGH (ref 65–99)

## 2016-12-17 ENCOUNTER — Ambulatory Visit: Payer: Medicare Other | Admitting: Podiatry

## 2017-01-14 ENCOUNTER — Encounter: Payer: Self-pay | Admitting: Podiatry

## 2017-01-14 ENCOUNTER — Ambulatory Visit (INDEPENDENT_AMBULATORY_CARE_PROVIDER_SITE_OTHER): Payer: Medicare Other | Admitting: Podiatry

## 2017-01-14 VITALS — Ht 61.0 in | Wt 190.0 lb

## 2017-01-14 DIAGNOSIS — B351 Tinea unguium: Secondary | ICD-10-CM | POA: Diagnosis not present

## 2017-01-14 DIAGNOSIS — E119 Type 2 diabetes mellitus without complications: Secondary | ICD-10-CM

## 2017-01-14 DIAGNOSIS — M79606 Pain in leg, unspecified: Secondary | ICD-10-CM

## 2017-01-14 NOTE — Progress Notes (Signed)
Patient ID: Brandi [name], female   DOB: 01/28/1956, 60 y.o.   MRN: [id number] Complaint:  Visit Type: Patient returns to my office for continued preventative foot care services. Complaint: Patient states" my nails have grown long and thick and become painful to walk and wear shoes" Patient has been diagnosed with DM with no foot complications. The patient presents for preventative foot care services. No changes to ROS  Podiatric Exam: Vascular: dorsalis pedis and posterior tibial pulses are palpable bilateral. Capillary return is immediate. Temperature gradient is WNL. Skin turgor WNL  Sensorium: Normal Semmes Weinstein monofilament test. Normal tactile sensation bilaterally. Nail Exam: Pt has thick disfigured discolored nails with subungual debris noted bilateral entire nail hallux through fifth toenails Ulcer Exam: There is no evidence of ulcer or pre-ulcerative changes or infection. Orthopedic Exam: Muscle tone and strength are WNL. No limitations in general ROM. No crepitus or effusions noted. Foot type and digits show no abnormalities. Bony prominences are unremarkable. Skin: No Porokeratosis. No infection or ulcers  Diagnosis:  Onychomycosis, , Pain in right toe, pain in left toes  Treatment & Plan Procedures and Treatment: Consent by patient was obtained for treatment procedures. The patient understood the discussion of treatment and procedures well. All questions were answered thoroughly reviewed. Debridement of mycotic and hypertrophic toenails, 1 through 5 bilateral and clearing of subungual debris. No ulceration, no infection noted.  Return Visit-Office Procedure: Patient instructed to return to the office for a follow up visit 3 months for continued evaluation and treatment.    Rien Marland DPM 

## 2017-03-11 ENCOUNTER — Ambulatory Visit (INDEPENDENT_AMBULATORY_CARE_PROVIDER_SITE_OTHER): Payer: Medicare Other | Admitting: Family Medicine

## 2017-03-11 ENCOUNTER — Other Ambulatory Visit: Payer: Self-pay

## 2017-03-11 ENCOUNTER — Other Ambulatory Visit (HOSPITAL_COMMUNITY)
Admission: RE | Admit: 2017-03-11 | Discharge: 2017-03-11 | Disposition: A | Discharge status: Home / Self Care | Payer: Medicare Other | Source: Ambulatory Visit | Attending: Family Medicine | Admitting: Family Medicine

## 2017-03-11 ENCOUNTER — Encounter: Payer: Self-pay | Admitting: Family Medicine

## 2017-03-11 VITALS — BP 126/80 | HR 93 | Ht 61.0 in | Wt 188.0 lb

## 2017-03-11 DIAGNOSIS — E119 Type 2 diabetes mellitus without complications: Secondary | ICD-10-CM | POA: Diagnosis not present

## 2017-03-11 DIAGNOSIS — Z79899 Other long term (current) drug therapy: Secondary | ICD-10-CM | POA: Diagnosis not present

## 2017-03-11 DIAGNOSIS — E1159 Type 2 diabetes mellitus with other circulatory complications: Secondary | ICD-10-CM | POA: Diagnosis not present

## 2017-03-11 DIAGNOSIS — E1136 Type 2 diabetes mellitus with diabetic cataract: Secondary | ICD-10-CM | POA: Diagnosis not present

## 2017-03-11 DIAGNOSIS — J301 Allergic rhinitis due to pollen: Secondary | ICD-10-CM | POA: Diagnosis not present

## 2017-03-11 DIAGNOSIS — Z124 Encounter for screening for malignant neoplasm of cervix: Secondary | ICD-10-CM

## 2017-03-11 DIAGNOSIS — F323 Major depressive disorder, single episode, severe with psychotic features: Secondary | ICD-10-CM

## 2017-03-11 DIAGNOSIS — E785 Hyperlipidemia, unspecified: Secondary | ICD-10-CM

## 2017-03-11 DIAGNOSIS — G43009 Migraine without aura, not intractable, without status migrainosus: Secondary | ICD-10-CM | POA: Diagnosis not present

## 2017-03-11 DIAGNOSIS — Z01419 Encounter for gynecological examination (general) (routine) without abnormal findings: Secondary | ICD-10-CM | POA: Diagnosis not present

## 2017-03-11 DIAGNOSIS — K219 Gastro-esophageal reflux disease without esophagitis: Secondary | ICD-10-CM

## 2017-03-11 DIAGNOSIS — I152 Hypertension secondary to endocrine disorders: Secondary | ICD-10-CM

## 2017-03-11 DIAGNOSIS — I10 Essential (primary) hypertension: Secondary | ICD-10-CM

## 2017-03-11 DIAGNOSIS — E669 Obesity, unspecified: Secondary | ICD-10-CM

## 2017-03-11 DIAGNOSIS — E1169 Type 2 diabetes mellitus with other specified complication: Secondary | ICD-10-CM | POA: Diagnosis not present

## 2017-03-11 LAB — POCT GLYCOSYLATED HEMOGLOBIN (HGB A1C): HEMOGLOBIN A1C: 7.2

## 2017-03-11 MED ORDER — ONETOUCH DELICA LANCETS FINE MISC: 3 refills | Status: DC

## 2017-03-11 NOTE — Patient Instructions (Signed)
20 minutes of something physical every day 

## 2017-03-11 NOTE — Progress Notes (Signed)
Subjective:    Patient ID: Brandi [name], female    DOB: [birth year], 61 y.o.   MRN: [id number]  Brandi [name] is a 61 y.o. female who presents for follow-up of Type 2 diabetes mellitus.  Home blood sugar records: out of lancets Current symptoms/problems None Daily foot checks: yes   Any foot concerns: None Exercise: bike at home every once in a while Eye: May 2018For a cataract follow-up. Diet:Has been to diabetes and nutrition but not really sticking with it She continues on her psychotropic medications and seems be doing well on them. Lisinopril is causing no difficulty. She is not taking Levaquin. She was admitted to the hospital in late December and treated for sepsis. She had multiple abnormalities in review of the record indicates that most recent been cleared up. Continues on atorvastatin without difficulty. He has not had difficulty with migraines recently. She continues on her reflux medicine that she is taking on a daily basis. Seasonal allergies are not causing any trouble with the present time. The following portions of the patient's history were reviewed and updated as appropriate: allergies, current medications, past medical history, past social history and problem list.  ROS as in subjective above.     Objective:    Physical Exam Alert and in no distress .Pap and pelvic done. No abnormalities noted on the pelvic.    Lab Review Diabetic Labs Latest Ref Rng & Units 12/08/2016 12/07/2016 12/06/2016 12/05/2016 12/04/2016  HbA1c - - - - - -  Microalbumin mg/L - - - - -  Micro/Creat Ratio - - - - - -  Chol <200 mg/dL - - - - -  HDL >50 mg/dL - - - - -  Calc LDL <100 mg/dL - - - - -  Triglycerides <150 mg/dL - - - - -  Creatinine 0.44 - 1.00 mg/dL 0.60 0.56 0.72 0.90 1.16(H)   BP/Weight 01/14/2017 12/08/2016 12/04/2016 12/03/2016 73/71/0626  Systolic BP - 948 - 546 270  Diastolic BP - 79 - 70 72  Wt. (Lbs) 190 - 190 190.6 194  BMI 35.9 - 35.9 36.01 36.66    Foot/eye exam completion dates Latest Ref Rng & Units 11/13/2015 10/02/2015  Eye Exam No Retinopathy - No Retinopathy  Foot Form Completion - Done -  A1c 7.2  [name]  reports that she has never smoked. She has never used smokeless tobacco. She reports that she does not drink alcohol or use drugs.     Assessment & Plan:    Severe single current episode of major depressive disorder, with psychotic features (HCC)  Obesity (BMI 30-39.9)  Hyperlipidemia associated with type 2 diabetes mellitus ([place])  Type 2 diabetes mellitus without complication, without long-term current use of insulin ([place])  Hypertension associated with diabetes ([place])  Encounter for long-term (current) use of medications  Migraine without aura and without status migrainosus, not intractable  Gastroesophageal reflux disease without esophagitis  Seasonal allergic rhinitis due to pollen, unspecified chronicity  Cataract associated with type 2 diabetes mellitus ([place])  the above diagnoses are all stable on present medications  1. Rx changes: none 2. Education: Reviewed 'ABCs' of diabetes management (respective goals in parentheses):  A1C (<7), blood pressure (<130/80), and cholesterol (LDL <100). 3. Compliance at present is estimated to be fair. Efforts to improve compliance (if necessary) will be directed at increased exercise. 4. Follow up: 4 months Did strongly encouraged her to increase her physical activity as she is essentially sedentary. Also recommended she go to the pharmacy  and get the new Shingrix ejection. I

## 2017-03-12 LAB — CYTOLOGY - PAP: Diagnosis: NEGATIVE

## 2017-04-15 ENCOUNTER — Encounter: Payer: Self-pay | Admitting: Podiatry

## 2017-04-15 ENCOUNTER — Ambulatory Visit (INDEPENDENT_AMBULATORY_CARE_PROVIDER_SITE_OTHER): Payer: Medicare Other | Admitting: Podiatry

## 2017-04-15 DIAGNOSIS — E119 Type 2 diabetes mellitus without complications: Secondary | ICD-10-CM

## 2017-04-15 DIAGNOSIS — M79606 Pain in leg, unspecified: Secondary | ICD-10-CM

## 2017-04-15 DIAGNOSIS — B351 Tinea unguium: Secondary | ICD-10-CM | POA: Diagnosis not present

## 2017-04-15 NOTE — Progress Notes (Signed)
Patient ID: Brandi [name], female   DOB: [birth year], 61 y.o.   MRN: [id number] Complaint:  Visit Type: Patient returns to my office for continued preventative foot care services. Complaint: Patient states" my nails have grown long and thick and become painful to walk and wear shoes" Patient has been diagnosed with DM with no foot complications. The patient presents for preventative foot care services. No changes to ROS  Podiatric Exam: Vascular: dorsalis pedis and posterior tibial pulses are palpable bilateral. Capillary return is immediate. Temperature gradient is WNL. Skin turgor WNL  Sensorium: Normal Semmes Weinstein monofilament test. Normal tactile sensation bilaterally. Nail Exam: Pt has thick disfigured discolored nails with subungual debris noted bilateral entire nail hallux through fifth toenails Ulcer Exam: There is no evidence of ulcer or pre-ulcerative changes or infection. Orthopedic Exam: Muscle tone and strength are WNL. No limitations in general ROM. No crepitus or effusions noted. Foot type and digits show no abnormalities. Bony prominences are unremarkable. Skin: No Porokeratosis. No infection or ulcers  Diagnosis:  Onychomycosis, , Pain in right toe, pain in left toes  Treatment & Plan Procedures and Treatment: Consent by patient was obtained for treatment procedures. The patient understood the discussion of treatment and procedures well. All questions were answered thoroughly reviewed. Debridement of mycotic and hypertrophic toenails, 1 through 5 bilateral and clearing of subungual debris. No ulceration, no infection noted.  Return Visit-Office Procedure: Patient instructed to return to the office for a follow up visit 3 months for continued evaluation and treatment.    Gardiner Barefoot DPM

## 2017-04-22 DIAGNOSIS — E119 Type 2 diabetes mellitus without complications: Secondary | ICD-10-CM | POA: Diagnosis not present

## 2017-04-22 DIAGNOSIS — Z961 Presence of intraocular lens: Secondary | ICD-10-CM | POA: Diagnosis not present

## 2017-04-22 LAB — HM DIABETES EYE EXAM

## 2017-04-23 ENCOUNTER — Encounter: Payer: Self-pay | Admitting: Family Medicine

## 2017-04-24 DIAGNOSIS — Z853 Personal history of malignant neoplasm of breast: Secondary | ICD-10-CM | POA: Diagnosis not present

## 2017-04-24 DIAGNOSIS — M8580 Other specified disorders of bone density and structure, unspecified site: Secondary | ICD-10-CM | POA: Diagnosis not present

## 2017-04-24 LAB — HM MAMMOGRAPHY

## 2017-04-24 LAB — HM DEXA SCAN: HM Dexa Scan: NORMAL

## 2017-04-28 ENCOUNTER — Encounter: Payer: Self-pay | Admitting: Family Medicine

## 2017-04-28 DIAGNOSIS — F323 Major depressive disorder, single episode, severe with psychotic features: Secondary | ICD-10-CM | POA: Diagnosis not present

## 2017-05-02 ENCOUNTER — Encounter: Payer: Self-pay | Admitting: Family Medicine

## 2017-05-29 ENCOUNTER — Other Ambulatory Visit: Payer: Self-pay | Admitting: Family Medicine

## 2017-06-29 ENCOUNTER — Other Ambulatory Visit: Payer: Self-pay | Admitting: Adult Health

## 2017-06-30 ENCOUNTER — Other Ambulatory Visit: Payer: Self-pay | Admitting: *Deleted

## 2017-06-30 MED ORDER — LACOSAMIDE 100 MG PO TABS: 1.0000 | ORAL_TABLET | Freq: Two times a day (BID) | ORAL | 0 refills | Status: DC

## 2017-06-30 NOTE — Progress Notes (Signed)
Faxed printed/signed rx vimpat to pt pharmacy. Fax: [phone]. Received confirmation.

## 2017-07-05 ENCOUNTER — Other Ambulatory Visit: Payer: Self-pay | Admitting: Family Medicine

## 2017-07-07 NOTE — Telephone Encounter (Signed)
Is is okay to refill?

## 2017-07-15 ENCOUNTER — Encounter: Payer: Self-pay | Admitting: Family Medicine

## 2017-07-15 ENCOUNTER — Ambulatory Visit (INDEPENDENT_AMBULATORY_CARE_PROVIDER_SITE_OTHER): Payer: Medicare Other | Admitting: Family Medicine

## 2017-07-15 VITALS — BP 120/78 | HR 97 | Ht 61.0 in | Wt 190.0 lb

## 2017-07-15 DIAGNOSIS — E1159 Type 2 diabetes mellitus with other circulatory complications: Secondary | ICD-10-CM | POA: Diagnosis not present

## 2017-07-15 DIAGNOSIS — Z79899 Other long term (current) drug therapy: Secondary | ICD-10-CM | POA: Diagnosis not present

## 2017-07-15 DIAGNOSIS — E669 Obesity, unspecified: Secondary | ICD-10-CM | POA: Diagnosis not present

## 2017-07-15 DIAGNOSIS — E119 Type 2 diabetes mellitus without complications: Secondary | ICD-10-CM

## 2017-07-15 DIAGNOSIS — F323 Major depressive disorder, single episode, severe with psychotic features: Secondary | ICD-10-CM

## 2017-07-15 DIAGNOSIS — I10 Essential (primary) hypertension: Secondary | ICD-10-CM | POA: Diagnosis not present

## 2017-07-15 DIAGNOSIS — E1169 Type 2 diabetes mellitus with other specified complication: Secondary | ICD-10-CM

## 2017-07-15 DIAGNOSIS — E785 Hyperlipidemia, unspecified: Secondary | ICD-10-CM | POA: Diagnosis not present

## 2017-07-15 DIAGNOSIS — K219 Gastro-esophageal reflux disease without esophagitis: Secondary | ICD-10-CM | POA: Diagnosis not present

## 2017-07-15 LAB — POCT GLYCOSYLATED HEMOGLOBIN (HGB A1C): Hemoglobin A1C: 7.2

## 2017-07-15 NOTE — Progress Notes (Signed)
  Subjective:    Patient ID: Brandi [name], female    DOB: Jun 11, 1956, 61 y.o.   MRN: [id number]  Brandi [name] is a 61 y.o. female who presents for follow-up of Type 2 diabetes mellitus.  Patient is not checking home blood sugars.   Home blood sugar records: Not being done How often is blood sugars being checked: N/A Current symptoms/problems NONE Daily foot checks: YES  Any foot concerns: NONE PODIATRY Last eye exam: 04/22/17 Exercise: Occasionally rides her bike Eating habits are unchanged. She continues to be followed by psychiatry and is on multiple psychotropic medications. She does use Protonix on a regular basis but is not necessarily having reflux symptoms regularly. Continues on lisinopril with no complaints. Presently she is on no diabetes related medications. She is also taking Lipitor with no trouble.  The following portions of the patient's history were reviewed and updated as appropriate: allergies, current medications, past medical history, past social history and problem list.  ROS as in subjective above.     Objective:    Physical Exam Alert and in no distress otherwise not examined.   Lab Review Diabetic Labs Latest Ref Rng & Units 07/15/2017 03/11/2017 12/08/2016 12/07/2016 12/06/2016  HbA1c - 7.2 7.2 - - -  Microalbumin mg/L - - - - -  Micro/Creat Ratio - - - - - -  Chol <200 mg/dL - - - - -  HDL >50 mg/dL - - - - -  Calc LDL <100 mg/dL - - - - -  Triglycerides <150 mg/dL - - - - -  Creatinine 0.44 - 1.00 mg/dL - - 0.60 0.56 0.72   BP/Weight 07/15/2017 03/11/2017 01/14/2017 12/08/2016 02/54/2706  Systolic BP 237 628 - 315 -  Diastolic BP 78 80 - 79 -  Wt. (Lbs) 190 188 190 - 190  BMI 35.9 35.52 35.9 - 35.9   Foot/eye exam completion dates Latest Ref Rng & Units 04/22/2017 11/13/2015  Eye Exam No Retinopathy No Retinopathy -  Foot Form Completion - - Done  A1c is 7.2  Brandi [name]  reports that she has never smoked. She has never used smokeless tobacco. She  reports that she does not drink alcohol or use drugs.     Assessment & Plan:    Type 2 diabetes mellitus without complication, without long-term current use of insulin (HCC) - Plan: HgB A1c  Severe single current episode of major depressive disorder, with psychotic features (HCC)  Obesity (BMI 30-39.9)  Hyperlipidemia associated with type 2 diabetes mellitus ([place])  Hypertension associated with diabetes ([place])  Encounter for long-term (current) use of medications  Gastroesophageal reflux disease without esophagitis  1. Rx changes: none 2. Education: Reviewed 'ABCs' of diabetes management (respective goals in parentheses):  A1C (<7), blood pressure (<130/80), and cholesterol (LDL <100). 3. Compliance at present is estimated to be fair. Efforts to improve compliance (if necessary) will be directed at increased exercise. 4. Follow up: 4 months Recommend she take her Protonix on an as-needed basis to see if that controls her reflux symptoms. Discussed increasing her physical activity to daily walks with her husband. Explained the need for her to check her blood sugars on a regular basis to ensure that she is still staying within a good range. Discussed the benefits of regular exercise, eating habits and cutting back on carbohydrates.  also recommended she go to the drugstore to get the new Shingrix vaccine.

## 2017-07-16 ENCOUNTER — Ambulatory Visit (INDEPENDENT_AMBULATORY_CARE_PROVIDER_SITE_OTHER): Payer: Medicare Other | Admitting: Podiatry

## 2017-07-16 ENCOUNTER — Encounter: Payer: Self-pay | Admitting: Podiatry

## 2017-07-16 DIAGNOSIS — B351 Tinea unguium: Secondary | ICD-10-CM

## 2017-07-16 DIAGNOSIS — M79676 Pain in unspecified toe(s): Secondary | ICD-10-CM | POA: Diagnosis not present

## 2017-07-16 DIAGNOSIS — E119 Type 2 diabetes mellitus without complications: Secondary | ICD-10-CM

## 2017-07-16 NOTE — Progress Notes (Signed)
Patient ID: Brandi [name], female   DOB: 11/21/1956, 61 y.o.   MRN: [id number] Complaint:  Visit Type: Patient returns to my office for continued preventative foot care services. Complaint: Patient states" my nails have grown long and thick and become painful to walk and wear shoes" Patient has been diagnosed with DM with no foot complications. The patient presents for preventative foot care services. No changes to ROS.  Patient has worn her compression socks which she bought herself. Skin lesions below knee  B/L.  Podiatric Exam: Vascular: dorsalis pedis and posterior tibial pulses are palpable bilateral. Capillary return is immediate. Temperature gradient is WNL. Skin turgor WNL  Sensorium: Normal Semmes Weinstein monofilament test. Normal tactile sensation bilaterally. Nail Exam: Pt has thick disfigured discolored nails with subungual debris noted bilateral entire nail hallux through fifth toenails Ulcer Exam: There is no evidence of ulcer or pre-ulcerative changes or infection. Orthopedic Exam: Muscle tone and strength are WNL. No limitations in general ROM. No crepitus or effusions noted. Foot type and digits show no abnormalities. Bony prominences are unremarkable. Skin: No Porokeratosis. No infection or ulcers  Diagnosis:  Onychomycosis, , Pain in right toe, pain in left toes  Treatment & Plan Procedures and Treatment: Consent by patient was obtained for treatment procedures. The patient understood the discussion of treatment and procedures well. All questions were answered thoroughly reviewed. Debridement of mycotic and hypertrophic toenails, 1 through 5 bilateral and clearing of subungual debris. No ulceration, no infection noted.  Return Visit-Office Procedure: Patient instructed to return to the office for a follow up visit 3 months for continued evaluation and treatment.    Gardiner Barefoot DPM

## 2017-08-13 ENCOUNTER — Other Ambulatory Visit: Payer: Self-pay | Admitting: Neurology

## 2017-09-21 DIAGNOSIS — Z23 Encounter for immunization: Secondary | ICD-10-CM | POA: Diagnosis not present

## 2017-09-26 ENCOUNTER — Other Ambulatory Visit: Payer: Self-pay | Admitting: Neurology

## 2017-09-29 NOTE — Telephone Encounter (Signed)
Faxed printed/signed rx Vimpat to [place], [place] at [phone]. Received fax confirmation.

## 2017-10-10 ENCOUNTER — Ambulatory Visit (INDEPENDENT_AMBULATORY_CARE_PROVIDER_SITE_OTHER): Payer: Medicare Other | Admitting: Family Medicine

## 2017-10-10 ENCOUNTER — Encounter: Payer: Self-pay | Admitting: Family Medicine

## 2017-10-10 VITALS — BP 120/70 | HR 94 | Ht 61.02 in | Wt 186.6 lb

## 2017-10-10 DIAGNOSIS — F323 Major depressive disorder, single episode, severe with psychotic features: Secondary | ICD-10-CM | POA: Diagnosis not present

## 2017-10-10 DIAGNOSIS — R569 Unspecified convulsions: Secondary | ICD-10-CM | POA: Diagnosis not present

## 2017-10-10 DIAGNOSIS — E669 Obesity, unspecified: Secondary | ICD-10-CM | POA: Diagnosis not present

## 2017-10-10 DIAGNOSIS — E119 Type 2 diabetes mellitus without complications: Secondary | ICD-10-CM

## 2017-10-10 NOTE — Patient Instructions (Signed)
Try to take the Protonix every other day and then even every third day if you can

## 2017-10-10 NOTE — Progress Notes (Signed)
Brandi [name] is a 61 y.o. female who presents for annual wellness visit and follow-up on chronic medical conditions.  She has the following concerns: She continues on her blood pressure medications., She is also followed by psychiatry and neurology and seems be doing well. Her diabetes seems to be under good control.  Immunizations and Health Maintenance Immunization History  Administered Date(s) Administered  . DTaP 06/17/2005  . Influenza Split 09/10/2011, 09/15/2012  . Influenza Whole 09/27/2009, 09/05/2010  . Influenza,inj,Quad PF,6+ Mos 09/16/2013, 09/19/2014, 09/24/2016  . Influenza-Unspecified 09/15/2015, 09/21/2017  . Pneumococcal Conjugate-13 09/19/2014  . Pneumococcal Polysaccharide-23 09/16/2013  . Zoster 03/01/2016   Health Maintenance Due  Topic Date Due  . HIV Screening  11/16/1970  . TETANUS/TDAP  11/16/1974  . FOOT EXAM  11/12/2016    Last Pap smear:2018 Last mammogram:2018 Last colonoscopy:2014 Last DEXA:2018 Dentist:? Ophtho:? Exercise: Minimal  Other doctors caring for patient include:[place]. [place]  Advanced directives:yes .asked for a copy    Depression screen:  See questionnaire below.  Depression screen [facility] 2/9 09/24/2016 09/21/2015 06/07/2014  Decreased Interest 0 0 0  Down, Depressed, Hopeless 0 0 0  PHQ - 2 Score 0 0 0    Fall Risk Screen: see questionnaire below. Fall Risk  09/24/2016 09/21/2015 09/21/2015 06/07/2014  Falls in the past year? No No No No    ADL screen:  See questionnaire below Functional Status Survey: Is the patient deaf or have difficulty hearing?: No Does the patient have difficulty seeing, even when wearing glasses/contacts?: No Does the patient have difficulty concentrating, remembering, or making decisions?: No Does the patient have difficulty walking or climbing stairs?: No Does the patient have difficulty dressing or bathing?: No Does the patient have difficulty doing errands alone such as visiting a doctor's  office or shopping?: No   Review of Systems Constitutional: -, -unexpected weight change, -anorexia, -fatigue Otherwise review of systems negative    PHYSICAL EXAM:  BP 120/70   Pulse 94   Ht 5' 1.02" (1.55 m)   Wt 186 lb 9.6 oz (84.6 kg)   SpO2 96%   BMI 35.23 kg/m   Alert and in no distress. Tympanic membranes and canals are normal. Pharyngeal area is normal. Neck is supple without adenopathy or thyromegaly. Cardiac exam shows a regular sinus rhythm without murmurs or gallops. Lungs are clear to auscultation.   ASSESSMENT/PLAN: Type 2 diabetes mellitus without complication, without long-term current use of insulin (HCC)  Obesity (BMI 30-39.9)  Current severe episode of major depressive disorder with psychotic features without prior episode ([place])  Convulsions, unspecified convulsion type ([place])    Discussed monthly self breast exams and yearly mammograms; at least 30 minutes of aerobic activity at least 5 days/week and weight-bearing exercise 2x/week; proper sunscreen use reviewed; healthy diet, including goals of calcium and vitamin D intake and alcohol recommendations (less than or equal to 1 drink/day) reviewed; regular seatbelt use; changing batteries in smoke detectors.  Immunization recommendations discussed.  Colonoscopy recommendations reviewed   Medicare Attestation I have personally reviewed: The patient's medical and social history Their use of alcohol, tobacco or illicit drugs Their current medications and supplements The patient's functional ability including ADLs,fall risks, home safety risks, cognitive, and hearing and visual impairment Diet and physical activities Evidence for depression or mood disorders  The patient's weight, height, and BMI have been recorded in the chart.  I have made referrals, counseling, and provided education to the patient based on review of the above and I have provided the  patient with a written personalized care plan for  preventive services.     [name], MD   10/10/2017

## 2017-10-13 ENCOUNTER — Other Ambulatory Visit: Payer: Self-pay | Admitting: Family Medicine

## 2017-10-13 DIAGNOSIS — F323 Major depressive disorder, single episode, severe with psychotic features: Secondary | ICD-10-CM | POA: Diagnosis not present

## 2017-10-15 ENCOUNTER — Ambulatory Visit (INDEPENDENT_AMBULATORY_CARE_PROVIDER_SITE_OTHER): Payer: Medicare Other | Admitting: Podiatry

## 2017-10-15 ENCOUNTER — Encounter: Payer: Self-pay | Admitting: Podiatry

## 2017-10-15 DIAGNOSIS — M79676 Pain in unspecified toe(s): Secondary | ICD-10-CM

## 2017-10-15 DIAGNOSIS — B351 Tinea unguium: Secondary | ICD-10-CM | POA: Diagnosis not present

## 2017-10-15 DIAGNOSIS — E119 Type 2 diabetes mellitus without complications: Secondary | ICD-10-CM

## 2017-10-15 NOTE — Progress Notes (Signed)
Patient ID: Brandi [name], female   DOB: 04-11-1956, 61 y.o.   MRN: [id number] Complaint:  Visit Type: Patient returns to my office for continued preventative foot care services. Complaint: Patient states" my nails have grown long and thick and become painful to walk and wear shoes" Patient has been diagnosed with DM with no foot complications. The patient presents for preventative foot care services. No changes to ROS.    Podiatric Exam: Vascular: dorsalis pedis and posterior tibial pulses are palpable bilateral. Capillary return is immediate. Temperature gradient is WNL. Skin turgor WNL  Sensorium: Normal Semmes Weinstein monofilament test. Normal tactile sensation bilaterally. Nail Exam: Pt has thick disfigured discolored nails with subungual debris noted bilateral entire nail hallux through fifth toenails Ulcer Exam: There is no evidence of ulcer or pre-ulcerative changes or infection. Orthopedic Exam: Muscle tone and strength are WNL. No limitations in general ROM. No crepitus or effusions noted. Foot type and digits show no abnormalities. Bony prominences are unremarkable. Skin: No Porokeratosis. No infection or ulcers  Diagnosis:  Onychomycosis, , Pain in right toe, pain in left toes  Treatment & Plan Procedures and Treatment: Consent by patient was obtained for treatment procedures. The patient understood the discussion of treatment and procedures well. All questions were answered thoroughly reviewed. Debridement of mycotic and hypertrophic toenails, 1 through 5 bilateral and clearing of subungual debris. No ulceration, no infection noted. ABN signed for 2018. Return Visit-Office Procedure: Patient instructed to return to the office for a follow up visit 3 months for continued evaluation and treatment.    Gardiner Barefoot DPM

## 2017-10-22 ENCOUNTER — Encounter: Payer: Self-pay | Admitting: Family Medicine

## 2017-10-22 ENCOUNTER — Ambulatory Visit (INDEPENDENT_AMBULATORY_CARE_PROVIDER_SITE_OTHER): Payer: Medicare Other | Admitting: Family Medicine

## 2017-10-22 VITALS — BP 132/90 | HR 112 | Temp 99.1°F | Resp 20 | Wt 182.8 lb

## 2017-10-22 DIAGNOSIS — E1169 Type 2 diabetes mellitus with other specified complication: Secondary | ICD-10-CM | POA: Diagnosis not present

## 2017-10-22 DIAGNOSIS — R5383 Other fatigue: Secondary | ICD-10-CM | POA: Diagnosis not present

## 2017-10-22 DIAGNOSIS — E119 Type 2 diabetes mellitus without complications: Secondary | ICD-10-CM

## 2017-10-22 DIAGNOSIS — R000 Tachycardia, unspecified: Secondary | ICD-10-CM | POA: Diagnosis not present

## 2017-10-22 DIAGNOSIS — E785 Hyperlipidemia, unspecified: Secondary | ICD-10-CM | POA: Diagnosis not present

## 2017-10-22 LAB — CBC WITH DIFFERENTIAL/PLATELET
BASOS PCT: 0.4 %
Basophils Absolute: 57 cells/uL (ref 0–200)
Eosinophils Absolute: 28 cells/uL (ref 15–500)
Eosinophils Relative: 0.2 %
HCT: 36.5 % (ref 35.0–45.0)
Hemoglobin: 11.2 g/dL — ABNORMAL LOW (ref 11.7–15.5)
Lymphs Abs: 2442 cells/uL (ref 850–3900)
MCH: 22.3 pg — ABNORMAL LOW (ref 27.0–33.0)
MCHC: 30.7 g/dL — ABNORMAL LOW (ref 32.0–36.0)
MCV: 72.7 fL — AB (ref 80.0–100.0)
MPV: 9.9 fL (ref 7.5–12.5)
Monocytes Relative: 9.2 %
NEUTROS PCT: 73 %
Neutro Abs: 10366 cells/uL — ABNORMAL HIGH (ref 1500–7800)
Platelets: 544 10*3/uL — ABNORMAL HIGH (ref 140–400)
RBC: 5.02 10*6/uL (ref 3.80–5.10)
RDW: 16.9 % — AB (ref 11.0–15.0)
TOTAL LYMPHOCYTE: 17.2 %
WBC mixed population: 1306 cells/uL — ABNORMAL HIGH (ref 200–950)
WBC: 14.2 10*3/uL — AB (ref 3.8–10.8)

## 2017-10-22 LAB — COMPREHENSIVE METABOLIC PANEL
AG Ratio: 1.7 (calc) (ref 1.0–2.5)
ALBUMIN MSPROF: 4.5 g/dL (ref 3.6–5.1)
ALKALINE PHOSPHATASE (APISO): 139 U/L — AB (ref 33–130)
ALT: 39 U/L — ABNORMAL HIGH (ref 6–29)
AST: 37 U/L — ABNORMAL HIGH (ref 10–35)
BUN: 17 mg/dL (ref 7–25)
CO2: 25 mmol/L (ref 20–32)
CREATININE: 0.77 mg/dL (ref 0.50–0.99)
Calcium: 9.7 mg/dL (ref 8.6–10.4)
Chloride: 103 mmol/L (ref 98–110)
GLOBULIN: 2.6 g/dL (ref 1.9–3.7)
GLUCOSE: 122 mg/dL — AB (ref 65–99)
POTASSIUM: 4 mmol/L (ref 3.5–5.3)
Sodium: 139 mmol/L (ref 135–146)
Total Bilirubin: 0.9 mg/dL (ref 0.2–1.2)
Total Protein: 7.1 g/dL (ref 6.1–8.1)

## 2017-10-22 LAB — LIPID PANEL
CHOL/HDL RATIO: 2.1 (calc) (ref <5.0)
Cholesterol: 150 mg/dL (ref <200)
HDL: 71 mg/dL (ref >50)
LDL CHOLESTEROL (CALC): 55 mg/dL
Non-HDL Cholesterol (Calc): 79 mg/dL (calc) (ref <130)
TRIGLYCERIDES: 153 mg/dL — AB (ref <150)

## 2017-10-22 LAB — GLUCOSE, POCT (MANUAL RESULT ENTRY): POC Glucose: 136 mg/dl — AB (ref 70–99)

## 2017-10-22 LAB — POCT GLYCOSYLATED HEMOGLOBIN (HGB A1C): HEMOGLOBIN A1C: 6.9

## 2017-10-22 LAB — TSH: TSH: 1.69 m[IU]/L (ref 0.40–4.50)

## 2017-10-22 NOTE — Progress Notes (Signed)
Chief Complaint  Patient presents with  . Feeling terrible    pt states she just feels horrible. Inital eval in waiting room HR 114, and resp 24. hx UTI's, but states unable to give UA at this time.     Patient is accompanied by her husband today.  Took meds yesterday morning, seemed fine before her husband went to work.  Later she started feeling bad, was in bed when husband got home, "just felt bad". Denied pain, declined dinner. Didn't take any meds last night or this morning. She seemed to feel a little warm last night and this morning, so he gave her tylenol.  She reports she was "moaning and groaning" and in bed yesterday. Feels tired. Denies pain.  Hasn't eaten anything since Monday night.  She normally only eats one meal/day, just snacks during the day.  No snacking yesterday. She was drinking water yesterday, and cran-grape juice. She had a Boost yesterday.   Husband reports she has h/o UTI's.  She denies urinary symptoms.   PMH, PSH, SH reviewed  Outpatient Encounter Medications as of 10/22/2017  Medication Sig Note  . aspirin EC 81 MG EC tablet Take 1 tablet (81 mg total) by mouth daily.   Marland Kitchen atorvastatin (LIPITOR) 20 MG tablet TAKE 1 TABLET (20 MG TOTAL) BY MOUTH DAILY.   . busPIRone (BUSPAR) 30 MG tablet Take 30 mg by mouth 2 (two) times daily.   . calcium-vitamin D (OSCAL WITH D) 250-125 MG-UNIT per tablet Take 2 tablets by mouth daily.   Marland Kitchen LATUDA 120 MG TABS Take 1 tablet by mouth daily.   Marland Kitchen levETIRAcetam (KEPPRA) 500 MG tablet TAKE 2 TABLETS (1,000 MG TOTAL) BY MOUTH 2 (TWO) TIMES DAILY.   Marland Kitchen lisinopril (PRINIVIL,ZESTRIL) 5 MG tablet TAKE 1 TABLET (5 MG TOTAL) BY MOUTH DAILY.   Marland Kitchen LORazepam (ATIVAN) 0.5 MG tablet Take 0.5-1 mg by mouth 2 (two) times daily. 1 tablet every morning, and 2 tablets at bedtime as needed   . pantoprazole (PROTONIX) 40 MG tablet TAKE 1 TABLET (40 MG TOTAL) BY MOUTH DAILY.   Marland Kitchen venlafaxine XR (EFFEXOR-XR) 150 MG 24 hr capsule Take 300 mg by mouth  daily.    Marland Kitchen VIMPAT 100 MG TABS TAKE 1 TABLET BY MOUTH TWICE A DAY 09/29/2017: Faxed printed/signed rx Vimpat to [place], [place] at [phone]. Received fax confirmation.   Glory Rosebush DELICA LANCETS FINE MISC DX: E11.9  Patient is to test one time a day    No facility-administered encounter medications on file as of 10/22/2017.    Allergies  Allergen Reactions  . Penicillins Rash    Has patient had a PCN reaction causing immediate rash, facial/tongue/throat swelling, SOB or lightheadedness with hypotension: No Has patient had a PCN reaction causing severe rash involving mucus membranes or skin necrosis: No Has patient had a PCN reaction that required hospitalization No Has patient had a PCN reaction occurring within the last 10 years: No If all of the above answers are "NO", then may proceed with Cephalosporin use.    ROS:  No runny nose, sneezing, coughing. Possible fevers.  Has periodic shortness of breath, none recently.  +heart racing since yesterday. Denies nausea, vomiting, abdominal pain or urinary complaints.  Bowels are normal, no BM that she can recall today or yesterday, but denies h/o constipation. Denies bleeding, bruising, rashes. Denies changes to her skin or hair.  PHYSICAL EXAM:  BP 132/90   Pulse (!) 112   Temp 99.1 F (37.3 C) (Oral)  Resp 20   Wt 182 lb 12.8 oz (82.9 kg)   SpO2 97%   BMI 34.51 kg/m   Wt Readings from Last 3 Encounters:  10/22/17 182 lb 12.8 oz (82.9 kg)  10/10/17 186 lb 9.6 oz (84.6 kg)  07/15/17 190 lb (86.2 kg)   Pale female, slightly tremulous, in no distress Not a good historian, unable to really communicate her symptoms effectively, other than stating "I feel bad", only elucidated to "fatigue" and denying all other symptoms that were asked extensively. A lot of blank stares, and looking to her husband to help answer questions.  I do not know her baseline.  HEENT: conjunctiva and sclera are clear.  OP is clear, no erythema Neck: no  lymphadenopathy, thyromegaly or thyroid tenderness Heart: tachycardic, occasional skipped beat. No murmur Lungs: clear bilaterally Back: no spinal or CVA tenderness Abdomen: obese, soft, nontender, no mass Extremities: no edema Skin: pale, normal turgor, no rash Psych: flat affect. Normal mood.  Neuro: alert. Slightly tremulous. In wheelchair (due to feeling weak/fatigue). Cranial nerves intact. Gait not assessed.   Glu 136 Lab Results  Component Value Date   HGBA1C 6.9 10/22/2017   ASSESSMENT/PLAN:  Tachycardia - possibly related to fever/illness. will also r/o hyperthyroidism - Plan: TSH, EKG 12-Lead  Diabetes mellitus without complication ([place]) - controlled - Plan: Comprehensive metabolic panel, HgB Z3G, Glucose (CBG)  Fatigue, unspecified type - suspect illness--unclear source, no defining symptoms. Check labs. She wasn't able to give urine sample today. Check for other etiologies - Plan: CBC with Differential/Platelet, Comprehensive metabolic panel, TSH  Hyperlipidemia associated with type 2 diabetes mellitus ([place]) - Plan: Lipid panel  EKG--tachycardia.  LAFB and incomplete RBBB. No acute changes. Similar to prior EKG (11/2017, but had complete RBBB then).  She was somewhat tremulous, and difficult tracing to read.  Low voltage in V1.  She happens to be fasting, so will go ahead and due full panel of labs, doing the c-met, CBC and TSH stat, given her tachycardia, weight loss, fatigue and weakness.   Please take your medications today. Try and get some food in you today--okay to stick with a bland diet. Please be in contact with [place] to let us know if you develop new or change in your symptoms.  [phone] home; ok to leave message Husband's cell [phone]   Lab Results  Component Value Date   WBC 14.2 (H) 10/22/2017   HGB 11.2 (L) 10/22/2017   HCT 36.5 10/22/2017   MCV 72.7 (L) 10/22/2017   PLT 544 (H) 10/22/2017     Chemistry      Component Value Date/Time    NA 139 10/22/2017 1007   NA 144 10/24/2014 1336   K 4.0 10/22/2017 1007   K 3.5 10/24/2014 1336   CL 103 10/22/2017 1007   CL 108 (H) 06/24/2013 0610   CL 103 10/22/2012 1343   CO2 25 10/22/2017 1007   CO2 31 (H) 10/24/2014 1336   BUN 17 10/22/2017 1007   BUN 14.9 10/24/2014 1336   CREATININE 0.77 10/22/2017 1007   CREATININE 0.8 10/24/2014 1336      Component Value Date/Time   CALCIUM 9.7 10/22/2017 1007   CALCIUM 10.7 (H) 10/24/2014 1336   ALKPHOS 76 12/07/2016 0704   ALKPHOS 138 10/24/2014 1336   AST 37 (H) 10/22/2017 1007   AST 21 10/24/2014 1336   ALT 39 (H) 10/22/2017 1007   ALT 31 10/24/2014 1336   BILITOT 0.9 10/22/2017 1007   BILITOT 0.44 10/24/2014 1336  Lab Results  Component Value Date   TSH 1.69 10/22/2017    Detailed message left on husband's voicemail with results, as requested at visit.  Elevated WBC. Contact us if new symptoms develop, consider returning for urine sample (she has h/o UTI's, unable to leave sample and no urinary complaints today) if not improving and no other symptoms develop.  Return in 1-2 days if not improving. Encouraged to push fluids, and use tylenol as needed for fever.

## 2017-11-03 ENCOUNTER — Encounter: Payer: Self-pay | Admitting: Neurology

## 2017-11-03 ENCOUNTER — Ambulatory Visit (INDEPENDENT_AMBULATORY_CARE_PROVIDER_SITE_OTHER): Payer: Medicare Other | Admitting: Neurology

## 2017-11-03 VITALS — BP 135/90 | HR 112 | Ht 61.0 in | Wt 183.5 lb

## 2017-11-03 DIAGNOSIS — R569 Unspecified convulsions: Secondary | ICD-10-CM

## 2017-11-03 NOTE — Progress Notes (Signed)
Reason for visit: Seizures  Brandi [name] is an 61 y.o. female  History of present illness:  Brandi [name] is a 61 year old right-handed white female with a history of seizures.  She has been very well controlled on seizure medications, she has had a generalized seizure many years ago, she remains on Keppra and Vimpat.  The patient is able to operate a motor vehicle.  The medications are well-tolerated, they do not cause dizziness, or gait instability.  The patient returns to the office today for an evaluation.  Past Medical History:  Diagnosis Date  . Allergic rhinitis   . Anemia   . Arthritis HX RIGHT ANKLE FX  . Cancer [facility])    breast, lumpectomy  . Depression    MAJOR  . Dyslipidemia   . GERD (gastroesophageal reflux disease)   . History of breast cancer DX 2010--  S/P LUMPECTOMY AND RADIATION -- NO RECURRENCE   ONCOLOGIST- DR Truddie Coco  . Hypertension   . Left flank pain   . Left ureteral calculus   . Migraines   . Schizophrenic disorder ([place])   . 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 Type II or unspecified type diabetes mellitus without mention of complication, not stated as uncontrolled 03/16/2014    Past Surgical History:  Procedure Laterality Date  . BREAST SURGERY  01-04-2009  DR Margot Chimes   LEFT BREAST LUMPECTOMY W/ SLN DISSECTION  FOR CANCER  . CATARACT EXTRACTION, BILATERAL Bilateral   . CESAREAN SECTION  1998  . CHILD BIRTH     C-SECTION  . CYSTOSCOPY WITH RETROGRADE PYELOGRAM, URETEROSCOPY AND STENT PLACEMENT Left 11/10/2012   Performed by Hanley Ben, [facility] at [facility]  . HOLMIUM LASER APPLICATION Left 15/17/6160   Performed by Hanley Ben, MD at [facility]  . LAPAROSCOPIC CHOLECYSTECTOMY  1990'S    Family History  Problem Relation Age of Onset  . Cancer Sister        breast  . Cancer Mother        breast  . Cancer Father        lung  . Mental illness Maternal Uncle   . Cancer  Maternal Grandmother   . Stroke Maternal Grandmother   . Tuberculosis Maternal Grandfather   . Cancer Paternal Grandmother   . Seizures Neg Hx     Social history:  reports that  has never smoked. she has never used smokeless tobacco. She reports that she does not drink alcohol or use drugs.    Allergies  Allergen Reactions  . Penicillins Rash    Has patient had a PCN reaction causing immediate rash, facial/tongue/throat swelling, SOB or lightheadedness with hypotension: No Has patient had a PCN reaction causing severe rash involving mucus membranes or skin necrosis: No Has patient had a PCN reaction that required hospitalization No Has patient had a PCN reaction occurring within the last 10 years: No If all of the above answers are "NO", then may proceed with Cephalosporin use.     Medications:  Prior to Admission medications   Medication Sig Start Date End Date Taking? Authorizing Provider  aspirin EC 81 MG EC tablet Take 1 tablet (81 mg total) by mouth daily. 12/09/16  Yes Mikhail, Maryann, DO  atorvastatin (LIPITOR) 20 MG tablet TAKE 1 TABLET (20 MG TOTAL) BY MOUTH DAILY. 10/13/17  Yes Denita Lung, MD  busPIRone (BUSPAR) 30 MG tablet Take 30 mg by mouth 2 (two) times daily.  11/01/14  Yes [provider]  calcium-vitamin D (OSCAL WITH D) 250-125 MG-UNIT per tablet Take 2 tablets by mouth daily. 05/18/14  Yes Causey, Charlestine Massed, NP  LATUDA 120 MG TABS Take 1 tablet by mouth daily. 10/17/15  Yes [provider]  levETIRAcetam (KEPPRA) 500 MG tablet TAKE 2 TABLETS (1,000 MG TOTAL) BY MOUTH 2 (TWO) TIMES DAILY. 08/13/17  Yes Millikan, Jinny Blossom, NP  lisinopril (PRINIVIL,ZESTRIL) 5 MG tablet TAKE 1 TABLET (5 MG TOTAL) BY MOUTH DAILY. 05/29/17  Yes Denita Lung, MD  LORazepam (ATIVAN) 0.5 MG tablet Take 0.5-1 mg by mouth 2 (two) times daily. 1 tablet every morning, and 2 tablets at bedtime as needed   Yes [provider]  [facility] DELICA LANCETS FINE MISC  DX: E11.9  Patient is to test one time a day 03/11/17  Yes Denita Lung, MD  pantoprazole (PROTONIX) 40 MG tablet TAKE 1 TABLET (40 MG TOTAL) BY MOUTH DAILY. 07/07/17  Yes Denita Lung, MD  venlafaxine XR (EFFEXOR-XR) 150 MG 24 hr capsule Take 300 mg by mouth daily.  10/16/15  Yes [provider]  VIMPAT 100 MG TABS TAKE 1 TABLET BY MOUTH TWICE A DAY 09/29/17  Yes Kathrynn Ducking, MD    ROS:  Out of a complete 14 system review of symptoms, the patient complains only of the following symptoms, and all other reviewed systems are negative.  History of seizures  Blood pressure 135/90, pulse (!) 112, height 5\' 1"  (1.549 m), weight 183 lb 8 oz (83.2 kg).  Physical Exam  General: The patient is alert and cooperative at the time of the examination.  The patient is markedly obese.  Skin: No significant peripheral edema is noted.   Neurologic Exam  Mental status: The patient is alert and oriented x 3 at the time of the examination. The patient has apparent normal recent and remote memory, with an apparently normal attention span and concentration ability.   Cranial nerves: Facial symmetry is present. Speech is normal, no aphasia or dysarthria is noted. Extraocular movements are full. Visual fields are full.  Motor: The patient has good strength in all 4 extremities.  Sensory examination: Soft touch sensation is symmetric on the face, arms, and legs.  Coordination: The patient has good finger-nose-finger and heel-to-shin bilaterally.  Gait and station: The patient has a normal gait. Tandem gait is normal. Romberg is negative. No drift is seen.  Reflexes: Deep tendon reflexes are symmetric.   Assessment/Plan:  1.  History of seizures  The patient is doing well on Vimpat and Keppra, she will continue the medications at this time.  She will follow-up in 1 year.  Jill Alexanders MD 11/03/2017 2:28 PM  [facility] [address] [place], [place] [address]  Phone [phone] Fax [phone]

## 2017-11-14 ENCOUNTER — Other Ambulatory Visit: Payer: Self-pay | Admitting: Family Medicine

## 2017-11-18 ENCOUNTER — Ambulatory Visit: Payer: Medicare Other | Admitting: Family Medicine

## 2017-12-06 DIAGNOSIS — J209 Acute bronchitis, unspecified: Secondary | ICD-10-CM | POA: Diagnosis not present

## 2017-12-13 DIAGNOSIS — J209 Acute bronchitis, unspecified: Secondary | ICD-10-CM | POA: Diagnosis not present

## 2017-12-17 ENCOUNTER — Other Ambulatory Visit: Payer: Self-pay | Admitting: Family Medicine

## 2017-12-17 MED ORDER — ATORVASTATIN CALCIUM 20 MG PO TABS: 20.0000 mg | ORAL_TABLET | Freq: Every day | ORAL | 4 refills | Status: DC

## 2017-12-25 ENCOUNTER — Encounter: Payer: Self-pay | Admitting: Emergency Medicine

## 2017-12-25 ENCOUNTER — Emergency Department: Payer: Medicare Other

## 2017-12-25 ENCOUNTER — Other Ambulatory Visit: Payer: Self-pay

## 2017-12-25 DIAGNOSIS — J40 Bronchitis, not specified as acute or chronic: Secondary | ICD-10-CM | POA: Diagnosis not present

## 2017-12-25 DIAGNOSIS — I10 Essential (primary) hypertension: Secondary | ICD-10-CM | POA: Diagnosis not present

## 2017-12-25 DIAGNOSIS — B9789 Other viral agents as the cause of diseases classified elsewhere: Secondary | ICD-10-CM | POA: Insufficient documentation

## 2017-12-25 DIAGNOSIS — E119 Type 2 diabetes mellitus without complications: Secondary | ICD-10-CM | POA: Insufficient documentation

## 2017-12-25 DIAGNOSIS — Z7982 Long term (current) use of aspirin: Secondary | ICD-10-CM | POA: Insufficient documentation

## 2017-12-25 DIAGNOSIS — Z853 Personal history of malignant neoplasm of breast: Secondary | ICD-10-CM | POA: Diagnosis not present

## 2017-12-25 DIAGNOSIS — J069 Acute upper respiratory infection, unspecified: Secondary | ICD-10-CM | POA: Diagnosis not present

## 2017-12-25 DIAGNOSIS — R5381 Other malaise: Secondary | ICD-10-CM | POA: Diagnosis not present

## 2017-12-25 DIAGNOSIS — R05 Cough: Secondary | ICD-10-CM | POA: Diagnosis not present

## 2017-12-25 NOTE — ED Triage Notes (Addendum)
Patient ambulatory to triage with steady gait, without difficulty or distress noted; pt reports since end of Christmas having nonprod cough, sinus drainage, low-grade temp; denies c/o pain or accomp symptoms; seen at urgent care recently and complete z-pak

## 2017-12-26 ENCOUNTER — Emergency Department
Admission: EM | Admit: 2017-12-26 | Discharge: 2017-12-26 | Disposition: A | Discharge status: Home / Self Care | Payer: Medicare Other | Attending: Emergency Medicine | Admitting: Emergency Medicine

## 2017-12-26 DIAGNOSIS — J40 Bronchitis, not specified as acute or chronic: Secondary | ICD-10-CM

## 2017-12-26 DIAGNOSIS — B9789 Other viral agents as the cause of diseases classified elsewhere: Secondary | ICD-10-CM

## 2017-12-26 DIAGNOSIS — J069 Acute upper respiratory infection, unspecified: Secondary | ICD-10-CM

## 2017-12-26 NOTE — ED Notes (Signed)
Pt to the ER for cough and congestions. SOB with exertion. Pt seen at [facility] 2 weeks ago, given something for cough, seen 1 week ago at [facility] and given Z pack. Pt states she still deosnt feel good. Pt says she ate 2 slices of pizza for Tuesday dinner and felt nauseated. Pt reports productive cough 3 days ago. Pt reports an episode of vomiting 1 week ago. No distress noted at this time.

## 2017-12-26 NOTE — ED Provider Notes (Signed)
[facility] Emergency Department Provider Note  ____________________________________________   First MD Initiated Contact with Patient 12/26/17 [phone]     (approximate)  I have reviewed the triage vital signs and the nursing notes.   HISTORY  Chief Complaint Cough    HPI Brandi [name] is a 62 y.o. female with medical history as listed below who presents for evaluation of a persistent cough for several weeks.  She reports that she was seen [facility] of [facility] ago and finished a Z-Pak but her cough has persisted.  She has had decreased appetite and general malaise.  She has a cough productive of yellow sputum.  She does not have shortness of breath, chest pain, nausea, vomiting, nor abdominal pain.  She has not had any recent fever or chills.  She is continuing to drink some fluids but eating less than usual.  Her cough seems worse at night.  She wanted to make sure that it has not developed into pneumonia.  Nothing particular makes it better and she describes it as severe.  Past Medical History:  Diagnosis Date  . Allergic rhinitis   . Anemia   . Arthritis HX RIGHT ANKLE FX  . Cancer [facility])    breast, lumpectomy  . Depression    MAJOR  . Dyslipidemia   . GERD (gastroesophageal reflux disease)   . History of breast cancer DX 2010--  S/P LUMPECTOMY AND RADIATION -- NO RECURRENCE   ONCOLOGIST- DR Truddie Coco  . Hypertension   . Left flank pain   . Left ureteral calculus   . Migraines   . Schizophrenic disorder ([place])   . 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 Type II or unspecified type diabetes mellitus without mention of complication, not stated as uncontrolled 03/16/2014    Patient Active Problem List   Diagnosis Date Noted  . Hypertension associated with diabetes ([place]) 12/05/2016  . Hypokalemia 12/05/2016  . Anemia 12/05/2016  . Convulsions/seizures ([place]) 11/02/2014  . Type 2 diabetes mellitus without complication,  without long-term current use of insulin ([place]) 03/16/2014  . Thrombocytosis ([place]) 06/29/2013  . Major depression 09/10/2011  . Migraine headache 09/10/2011  . Obesity (BMI 30-39.9) 09/10/2011  . GERD (gastroesophageal reflux disease) 09/10/2011  . Allergic rhinitis due to pollen 09/10/2011  . History of breast cancer in female 09/10/2011  . Hyperlipidemia associated with type 2 diabetes mellitus ([place]) 09/10/2011    Past Surgical History:  Procedure Laterality Date  . BREAST SURGERY  01-04-2009  DR Margot Chimes   LEFT BREAST LUMPECTOMY W/ SLN DISSECTION  FOR CANCER  . CATARACT EXTRACTION, BILATERAL Bilateral   . CESAREAN SECTION  1998  . CHILD BIRTH     C-SECTION  . CYSTOSCOPY WITH RETROGRADE PYELOGRAM, URETEROSCOPY AND STENT PLACEMENT  11/10/2012   Procedure: CYSTOSCOPY WITH RETROGRADE PYELOGRAM, URETEROSCOPY AND STENT PLACEMENT;  Surgeon: Hanley Ben, MD;  Location: [place];  Service: Urology;  Laterality: Left;  WITH DOUBLE J STENT  . HOLMIUM LASER APPLICATION  62/37/6283   Procedure: HOLMIUM LASER APPLICATION;  Surgeon: Hanley Ben, MD;  Location: [facility];  Service: Urology;  Laterality: Left;  . LAPAROSCOPIC CHOLECYSTECTOMY  1990'S    Prior to Admission medications   Medication Sig Start Date End Date Taking? Authorizing Provider  aspirin EC 81 MG EC tablet Take 1 tablet (81 mg total) by mouth daily. 12/09/16   Mikhail, Velta Addison, DO  atorvastatin (LIPITOR) 20 MG tablet Take 1 tablet (  20 mg total) by mouth daily. 12/17/17   Denita Lung, MD  busPIRone (BUSPAR) 30 MG tablet Take 30 mg by mouth 2 (two) times daily. 11/01/14   [provider]  calcium-vitamin D (OSCAL WITH D) 250-125 MG-UNIT per tablet Take 2 tablets by mouth daily. 05/18/14   Causey, Charlestine Massed, NP  LATUDA 120 MG TABS Take 1 tablet by mouth daily. 10/17/15   [provider]  levETIRAcetam (KEPPRA) 500 MG tablet TAKE 2 TABLETS (1,000 MG TOTAL) BY MOUTH 2  (TWO) TIMES DAILY. 08/13/17   Ward Givens, NP  lisinopril (PRINIVIL,ZESTRIL) 5 MG tablet TAKE 1 TABLET (5 MG TOTAL) BY MOUTH DAILY. 05/29/17   Denita Lung, MD  LORazepam (ATIVAN) 0.5 MG tablet Take 0.5-1 mg by mouth 2 (two) times daily. 1 tablet every morning, and 2 tablets at bedtime as needed    [provider]  [facility] DELICA LANCETS FINE MISC DX: E11.9  Patient is to test one time a day 03/11/17   Denita Lung, MD  pantoprazole (PROTONIX) 40 MG tablet TAKE 1 TABLET (40 MG TOTAL) BY MOUTH DAILY. 07/07/17   Denita Lung, MD  venlafaxine XR (EFFEXOR-XR) 150 MG 24 hr capsule Take 300 mg by mouth daily.  10/16/15   [provider]  VIMPAT 100 MG TABS TAKE 1 TABLET BY MOUTH TWICE A DAY 09/29/17   Kathrynn Ducking, MD    Allergies Penicillins  Family History  Problem Relation Age of Onset  . Cancer Sister        breast  . Cancer Mother        breast  . Cancer Father        lung  . Mental illness Maternal Uncle   . Cancer Maternal Grandmother   . Stroke Maternal Grandmother   . Tuberculosis Maternal Grandfather   . Cancer Paternal Grandmother   . Seizures Neg Hx     Social History Social History   Tobacco Use  . Smoking status: Never Smoker  . Smokeless tobacco: Never Used  Substance Use Topics  . Alcohol use: No  . Drug use: No    Review of Systems Constitutional: No fever/chills.  General malaise Eyes: No visual changes. ENT: No sore throat. Cardiovascular: Denies chest pain. Respiratory: Persistent productive cough.  Denies shortness of breath. Gastrointestinal: No abdominal pain.  No nausea, no vomiting.  No diarrhea.  No constipation. Genitourinary: Negative for dysuria. Musculoskeletal: Negative for neck pain.  Negative for back pain. Integumentary: Negative for rash. Neurological: Negative for headaches, focal weakness or numbness.   ____________________________________________   PHYSICAL EXAM:  VITAL SIGNS: ED Triage Vitals    Enc Vitals Group     BP 12/25/17 2234 100/63     Pulse Rate 12/25/17 2234 97     Resp 12/25/17 2234 20     Temp 12/25/17 2234 97.6 F (36.4 C)     Temp Source 12/25/17 2234 Oral     SpO2 12/25/17 2234 97 %     Weight 12/25/17 2235 82.6 kg (182 lb)     Height 12/25/17 2235 1.524 m (5')     Head Circumference --      Peak Flow --      Pain Score --      Pain Loc --      Pain Edu? --      Excl. in [place]? --     Constitutional: Alert and oriented. Well appearing and in no acute distress. Eyes: Conjunctivae are normal.  Head: Atraumatic. Nose: No congestion/rhinnorhea. Mouth/Throat: Mucous membranes are moist. Neck: No stridor.  No meningeal signs.   Cardiovascular: Normal rate, regular rhythm. Good peripheral circulation. Grossly normal heart sounds. Respiratory: Frequent productive cough.  Normal respiratory effort.  No retractions. Lungs CTAB. Gastrointestinal: Soft and nontender. No distention.  Musculoskeletal: No lower extremity tenderness nor edema. No gross deformities of extremities. Neurologic:  Normal speech and language. No gross focal neurologic deficits are appreciated.  Skin:  Skin is warm, dry and intact. No rash noted. Psychiatric: Mood and affect are normal. Speech and behavior are normal.  ____________________________________________   LABS (all labs ordered are listed, but only abnormal results are displayed)  Labs Reviewed - No data to display ____________________________________________  EKG  None - EKG not ordered by ED physician ____________________________________________  RADIOLOGY   Dg Chest 2 View  Result Date: 12/25/2017 CLINICAL DATA:  Cough EXAM: CHEST  2 VIEW COMPARISON:  Chest radiograph 06/20/2013 FINDINGS: The heart size and mediastinal contours are within normal limits. Both lungs are clear. The visualized skeletal structures are unremarkable. IMPRESSION: No active cardiopulmonary disease. Electronically Signed   By: Ulyses Jarred M.D.    On: 12/25/2017 23:30    ____________________________________________   PROCEDURES  Critical Care performed: No   Procedure(s) performed:   Procedures   ____________________________________________   INITIAL IMPRESSION / ASSESSMENT AND PLAN / ED COURSE  As part of my medical decision making, I reviewed the following data within the [place] notes reviewed and incorporated and Radiograph reviewed     Differential diagnosis includes, but is not limited to, persistent viral bronchitis, community-acquired pneumonia, foreign body aspiration, etc.  The patient's physical exam is reassuring and vital signs are essentially within normal limits except for a very mild tachycardia but the patient does have a frequent cough.  She has no hypoxemia and her chest x-ray is reassuring with no evidence of focal pneumonia.  She is comfortable with the plan for outpatient follow-up and I think that is appropriate.  I encouraged her to take her existing cough medicine and I prescribed an albuterol inhaler which may help with bronchospasm.  I gave my usual customary return precautions.  Indication for additional antibiotics.     ____________________________________________  FINAL CLINICAL IMPRESSION(S) / ED DIAGNOSES  Final diagnoses:  Bronchitis  Viral URI with cough     MEDICATIONS GIVEN DURING THIS VISIT:  Medications - No data to display   ED Discharge Orders    None       Note:  This document was prepared using Dragon voice recognition software and may include unintentional dictation errors.    Hinda Kehr, MD 12/26/17 [phone]

## 2017-12-26 NOTE — Discharge Instructions (Signed)
You have been seen in the Emergency Department (ED) today for a likely viral illness.  Please drink plenty of clear fluids (water, Gatorade, chicken broth, etc).  You may use Tylenol and/or Motrin according to label instructions.  You can alternate between the two without any side effects.  Try using the prescribed inhaler for help with your persistent cough, and use the cough medication you have already been prescribed.  Please follow up with your doctor as listed above.  Call your doctor or return to the Emergency Department (ED) if you are unable to tolerate fluids due to vomiting, have worsening trouble breathing, become extremely tired or difficult to awaken, or if you develop any other symptoms that concern you.

## 2018-01-06 ENCOUNTER — Other Ambulatory Visit: Payer: Self-pay | Admitting: Neurology

## 2018-01-07 NOTE — Telephone Encounter (Signed)
Faxed printed/signed rx vimpat to [facility] at [phone]. Received fax confirmation.

## 2018-01-21 ENCOUNTER — Ambulatory Visit: Payer: Medicare Other | Admitting: Podiatry

## 2018-01-26 ENCOUNTER — Encounter: Payer: Self-pay | Admitting: Family Medicine

## 2018-01-26 ENCOUNTER — Ambulatory Visit (INDEPENDENT_AMBULATORY_CARE_PROVIDER_SITE_OTHER): Payer: Medicare Other | Admitting: Family Medicine

## 2018-01-26 VITALS — BP 122/82 | HR 100 | Wt 175.0 lb

## 2018-01-26 DIAGNOSIS — E669 Obesity, unspecified: Secondary | ICD-10-CM | POA: Diagnosis not present

## 2018-01-26 DIAGNOSIS — I10 Essential (primary) hypertension: Secondary | ICD-10-CM

## 2018-01-26 DIAGNOSIS — E1159 Type 2 diabetes mellitus with other circulatory complications: Secondary | ICD-10-CM

## 2018-01-26 DIAGNOSIS — E119 Type 2 diabetes mellitus without complications: Secondary | ICD-10-CM

## 2018-01-26 DIAGNOSIS — E1169 Type 2 diabetes mellitus with other specified complication: Secondary | ICD-10-CM | POA: Diagnosis not present

## 2018-01-26 DIAGNOSIS — E785 Hyperlipidemia, unspecified: Secondary | ICD-10-CM | POA: Diagnosis not present

## 2018-01-26 DIAGNOSIS — F323 Major depressive disorder, single episode, severe with psychotic features: Secondary | ICD-10-CM | POA: Diagnosis not present

## 2018-01-26 DIAGNOSIS — R569 Unspecified convulsions: Secondary | ICD-10-CM | POA: Diagnosis not present

## 2018-01-26 LAB — POCT GLYCOSYLATED HEMOGLOBIN (HGB A1C): Hemoglobin A1C: 6.8

## 2018-01-26 MED ORDER — ATORVASTATIN CALCIUM 20 MG PO TABS: 20.0000 mg | ORAL_TABLET | Freq: Every day | ORAL | 3 refills | Status: DC

## 2018-01-26 MED ORDER — LISINOPRIL 5 MG PO TABS: ORAL_TABLET | ORAL | 3 refills | Status: DC

## 2018-01-26 NOTE — Addendum Note (Signed)
Addended by: Elyse Jarvis on: 01/26/2018 10:59 AM   Modules accepted: Orders

## 2018-01-26 NOTE — Progress Notes (Signed)
  Subjective:    Patient ID: Brandi [name], female    DOB: [birth year], 62 y.o.   MRN: [id number]  DENYS LABREE is a 62 y.o. female who presents for follow-up of Type 2 diabetes mellitus.  Patient is checking home blood sugars.   Home blood sugar records:pt reports 131 as the highest How often is blood sugars being checked: qd Current symptoms/problems include none and have been unchanged. Daily foot checks: yes   Any foot concerns: no Last eye exam: appt in may Exercise: qd try to at least walk She continues to be followed by psychiatry and is on multiple medications for that.  She also is taking Lipitor and having no aches or pains with that.  Lisinopril is also been taken without any difficulty.  She has lost some weight since her last visit. The following portions of the patient's history were reviewed and updated as appropriate: allergies, current medications, past medical history, past social history and problem list.  ROS as in subjective above.     Objective:    Physical Exam Alert and in no distress otherwise not examined.   Lab Review Diabetic Labs Latest Ref Rng & Units 10/22/2017 07/15/2017 03/11/2017 12/08/2016 12/07/2016  HbA1c - 6.9 7.2 7.2 - -  Microalbumin mg/L - - - - -  Micro/Creat Ratio - - - - - -  Chol <200 mg/dL 150 - - - -  HDL >50 mg/dL 71 - - - -  Calc LDL <100 mg/dL - - - - -  Triglycerides <150 mg/dL 153(H) - - - -  Creatinine 0.50 - 0.99 mg/dL 0.77 - - 0.60 0.56   BP/Weight 12/26/2017 12/25/2017 11/03/2017 10/22/2017 26/37/8588  Systolic BP 502 - 774 128 786  Diastolic BP 77 - 90 90 70  Wt. (Lbs) - 182 183.5 182.8 186.6  BMI 35.54 - 34.67 34.51 35.23   Foot/eye exam completion dates Latest Ref Rng & Units 04/22/2017 11/13/2015  Eye Exam No Retinopathy No Retinopathy -  Foot Form Completion - - Done  A1c is 6.8  Nyjae  reports that  has never smoked. she has never used smokeless tobacco. She reports that she does not drink alcohol or use  drugs.     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 Rx changes: none 2. Education: Reviewed 'ABCs' of diabetes management (respective goals in parentheses):  A1C (<7), blood pressure (<130/80), and cholesterol (LDL <100). 3. Compliance at present is estimated to be good. Efforts to improve compliance (if necessary) will be directed at increased exercise. 4. Follow up: 4 months  5. I complemented her on her weight loss.  Follow-up here in 4 months.

## 2018-02-20 ENCOUNTER — Other Ambulatory Visit: Payer: Self-pay

## 2018-02-20 ENCOUNTER — Emergency Department (HOSPITAL_COMMUNITY)
Admission: EM | Admit: 2018-02-20 | Discharge: 2018-02-20 | Disposition: A | Discharge status: Home / Self Care | Payer: Medicare Other | Attending: Emergency Medicine | Admitting: Emergency Medicine

## 2018-02-20 DIAGNOSIS — Z043 Encounter for examination and observation following other accident: Secondary | ICD-10-CM | POA: Diagnosis not present

## 2018-02-20 DIAGNOSIS — E119 Type 2 diabetes mellitus without complications: Secondary | ICD-10-CM | POA: Diagnosis not present

## 2018-02-20 DIAGNOSIS — I10 Essential (primary) hypertension: Secondary | ICD-10-CM | POA: Insufficient documentation

## 2018-02-20 DIAGNOSIS — Z7982 Long term (current) use of aspirin: Secondary | ICD-10-CM | POA: Insufficient documentation

## 2018-02-20 DIAGNOSIS — Z0000 Encounter for general adult medical examination without abnormal findings: Secondary | ICD-10-CM | POA: Insufficient documentation

## 2018-02-20 DIAGNOSIS — Z041 Encounter for examination and observation following transport accident: Secondary | ICD-10-CM | POA: Diagnosis not present

## 2018-02-20 DIAGNOSIS — Z79899 Other long term (current) drug therapy: Secondary | ICD-10-CM | POA: Diagnosis not present

## 2018-02-20 DIAGNOSIS — Z853 Personal history of malignant neoplasm of breast: Secondary | ICD-10-CM | POA: Insufficient documentation

## 2018-02-20 DIAGNOSIS — I6789 Other cerebrovascular disease: Secondary | ICD-10-CM | POA: Diagnosis not present

## 2018-02-20 DIAGNOSIS — R4781 Slurred speech: Secondary | ICD-10-CM | POA: Diagnosis not present
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
## 2018-02-20 NOTE — ED Notes (Signed)
Fluids given to patient. Patient tolerated fluid

## 2018-02-20 NOTE — Discharge Instructions (Signed)
It was our pleasure to provide your ER care today.  Return if worse, new symptoms, new or severe pain, other concern.

## 2018-02-20 NOTE — ED Notes (Addendum)
Patient ambulated to the bathroom without assistance. She has denies pain and/or discomfort
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
## 2018-03-02 DIAGNOSIS — S0591XA Unspecified injury of right eye and orbit, initial encounter: Secondary | ICD-10-CM | POA: Diagnosis not present

## 2018-04-20 ENCOUNTER — Other Ambulatory Visit: Payer: Self-pay | Admitting: Family Medicine

## 2018-04-20 NOTE — Telephone Encounter (Signed)
Is this change ok? 

## 2018-04-28 DIAGNOSIS — Z853 Personal history of malignant neoplasm of breast: Secondary | ICD-10-CM | POA: Diagnosis not present

## 2018-04-28 DIAGNOSIS — E119 Type 2 diabetes mellitus without complications: Secondary | ICD-10-CM | POA: Diagnosis not present

## 2018-04-28 DIAGNOSIS — R928 Other abnormal and inconclusive findings on diagnostic imaging of breast: Secondary | ICD-10-CM | POA: Diagnosis not present

## 2018-04-28 DIAGNOSIS — Z961 Presence of intraocular lens: Secondary | ICD-10-CM | POA: Diagnosis not present

## 2018-04-28 LAB — HM DIABETES EYE EXAM

## 2018-04-28 LAB — HM MAMMOGRAPHY

## 2018-05-06 DIAGNOSIS — F323 Major depressive disorder, single episode, severe with psychotic features: Secondary | ICD-10-CM | POA: Diagnosis not present

## 2018-05-26 ENCOUNTER — Encounter: Payer: Self-pay | Admitting: Family Medicine

## 2018-05-26 ENCOUNTER — Ambulatory Visit (INDEPENDENT_AMBULATORY_CARE_PROVIDER_SITE_OTHER): Payer: Medicare Other | Admitting: Family Medicine

## 2018-05-26 VITALS — BP 108/76 | HR 102 | Temp 97.6°F | Ht 62.0 in | Wt 166.8 lb

## 2018-05-26 DIAGNOSIS — J301 Allergic rhinitis due to pollen: Secondary | ICD-10-CM | POA: Diagnosis not present

## 2018-05-26 DIAGNOSIS — E1169 Type 2 diabetes mellitus with other specified complication: Secondary | ICD-10-CM | POA: Diagnosis not present

## 2018-05-26 DIAGNOSIS — E785 Hyperlipidemia, unspecified: Secondary | ICD-10-CM

## 2018-05-26 DIAGNOSIS — E1159 Type 2 diabetes mellitus with other circulatory complications: Secondary | ICD-10-CM | POA: Diagnosis not present

## 2018-05-26 DIAGNOSIS — I10 Essential (primary) hypertension: Secondary | ICD-10-CM

## 2018-05-26 DIAGNOSIS — K219 Gastro-esophageal reflux disease without esophagitis: Secondary | ICD-10-CM | POA: Diagnosis not present

## 2018-05-26 DIAGNOSIS — F323 Major depressive disorder, single episode, severe with psychotic features: Secondary | ICD-10-CM

## 2018-05-26 DIAGNOSIS — E119 Type 2 diabetes mellitus without complications: Secondary | ICD-10-CM | POA: Diagnosis not present

## 2018-05-26 LAB — POCT GLYCOSYLATED HEMOGLOBIN (HGB A1C): HEMOGLOBIN A1C: 6.6 % — AB (ref 4.0–5.6)

## 2018-05-26 NOTE — Progress Notes (Signed)
  Subjective:    Patient ID: Brandi [name], female    DOB: 12/08/1956, 62 y.o.   MRN: [id number]  Brandi [name] is a 62 y.o. female who presents for follow-up of Type 2 diabetes mellitus.  Patient is checking home blood sugars.  She has made some dietary changes and has lost some weight. Home blood sugar records: meter How often is blood sugars being checked: qd 140 to 160 Current symptoms/problems include none and have been unchanged. Daily foot checks: yes   Any foot concerns: no Last eye exam: 2019 in may Exercise: no She continues on Lipitor and lisinopril and is having no difficulty with them.  She is also taking multiple psychotropic medications and continues to be followed by Dr. Clovis Pu. She does have underlying reflux but rarely takes medication for that. The following portions of the patient's history were reviewed and updated as appropriate: allergies, current medications, past medical history, past social history and problem list.  ROS as in subjective above.     Objective:    Physical Exam Alert and in no distress otherwise not examined.  Blood pressure 108/76, pulse (!) 102, temperature 97.6 F (36.4 C), height 5\' 2"  (1.575 m), weight 166 lb 12.8 oz (75.7 kg), SpO2 98 %.  Lab Review Diabetic Labs Latest Ref Rng & Units 01/26/2018 10/22/2017 07/15/2017 03/11/2017 12/08/2016  HbA1c - 6.8 6.9 7.2 7.2 -  Microalbumin mg/L - - - - -  Micro/Creat Ratio - - - - - -  Chol <200 mg/dL - 150 - - -  HDL >50 mg/dL - 71 - - -  Calc LDL mg/dL (calc) - 55 - - -  Triglycerides <150 mg/dL - 153(H) - - -  Creatinine 0.50 - 0.99 mg/dL - 0.77 - - 0.60   BP/Weight 05/26/2018 02/20/2018 01/26/2018 12/26/2017 5/00/9381  Systolic BP 829 937 169 678 -  Diastolic BP 76 76 82 77 -  Wt. (Lbs) 166.8 - 175 - 182  BMI 30.51 - 34.18 35.54 -   Foot/eye exam completion dates Latest Ref Rng & Units 04/28/2018 04/22/2017  Eye Exam No Retinopathy No Retinopathy No Retinopathy  Foot Form Completion - -  -  A1c is 6.6  Brandi [name]  reports that she has never smoked. She has never used smokeless tobacco. She reports that she does not drink alcohol or use drugs.     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 Rx changes: none 2. Education: Reviewed 'ABCs' of diabetes management (respective goals in parentheses):  A1C (<7), blood pressure (<130/80), and cholesterol (LDL <100). 3. Compliance at present is estimated to be good. Efforts to improve compliance (if necessary) will be directed at increased exercise.  Recommended 20 minutes of something physical every day. 4. Follow up: 4 months Overall she seems to be doing fairly well however I did encourage her to add physical activity to her regimen as she is really doing nothing at this point peer

## 2018-05-26 NOTE — Addendum Note (Signed)
Addended by: Elyse Jarvis on: 05/26/2018 10:38 AM   Modules accepted: Orders

## 2018-05-26 NOTE — Patient Instructions (Signed)
20 minutes of something physical every day 

## 2018-06-12 ENCOUNTER — Telehealth: Payer: Self-pay

## 2018-06-12 ENCOUNTER — Other Ambulatory Visit: Payer: Self-pay | Admitting: Medical

## 2018-06-12 ENCOUNTER — Ambulatory Visit (INDEPENDENT_AMBULATORY_CARE_PROVIDER_SITE_OTHER): Payer: Medicare Other | Admitting: Medical

## 2018-06-12 ENCOUNTER — Encounter: Payer: Self-pay | Admitting: Medical

## 2018-06-12 VITALS — BP 130/80 | HR 107 | Temp 98.7°F | Resp 16 | Wt 166.6 lb

## 2018-06-12 DIAGNOSIS — R9431 Abnormal electrocardiogram [ECG] [EKG]: Secondary | ICD-10-CM | POA: Diagnosis not present

## 2018-06-12 DIAGNOSIS — R5381 Other malaise: Secondary | ICD-10-CM | POA: Diagnosis not present

## 2018-06-12 DIAGNOSIS — D649 Anemia, unspecified: Secondary | ICD-10-CM | POA: Diagnosis not present

## 2018-06-12 DIAGNOSIS — R0602 Shortness of breath: Secondary | ICD-10-CM | POA: Diagnosis not present

## 2018-06-12 DIAGNOSIS — I10 Essential (primary) hypertension: Secondary | ICD-10-CM | POA: Diagnosis not present

## 2018-06-12 DIAGNOSIS — E119 Type 2 diabetes mellitus without complications: Secondary | ICD-10-CM

## 2018-06-12 DIAGNOSIS — R1084 Generalized abdominal pain: Secondary | ICD-10-CM

## 2018-06-12 DIAGNOSIS — R945 Abnormal results of liver function studies: Secondary | ICD-10-CM | POA: Diagnosis not present

## 2018-06-12 DIAGNOSIS — D72829 Elevated white blood cell count, unspecified: Secondary | ICD-10-CM | POA: Diagnosis not present

## 2018-06-12 DIAGNOSIS — R7989 Other specified abnormal findings of blood chemistry: Secondary | ICD-10-CM | POA: Insufficient documentation

## 2018-06-12 DIAGNOSIS — E1159 Type 2 diabetes mellitus with other circulatory complications: Secondary | ICD-10-CM

## 2018-06-12 LAB — COMPREHENSIVE METABOLIC PANEL
ALBUMIN: 3.8 g/dL (ref 3.6–4.8)
ALK PHOS: 380 IU/L — AB (ref 39–117)
ALT: 611 IU/L (ref 0–32)
AST: 302 IU/L — AB (ref 0–40)
Albumin/Globulin Ratio: 1.7 (ref 1.2–2.2)
BILIRUBIN TOTAL: 1 mg/dL (ref 0.0–1.2)
BUN / CREAT RATIO: 18 (ref 12–28)
BUN: 14 mg/dL (ref 8–27)
CHLORIDE: 105 mmol/L (ref 96–106)
CO2: 25 mmol/L (ref 20–29)
CREATININE: 0.76 mg/dL (ref 0.57–1.00)
Calcium: 9.2 mg/dL (ref 8.7–10.3)
GFR calc Af Amer: 97 mL/min/{1.73_m2} (ref >59)
GFR calc non Af Amer: 84 mL/min/{1.73_m2} (ref >59)
GLUCOSE: 122 mg/dL — AB (ref 65–99)
Globulin, Total: 2.2 g/dL (ref 1.5–4.5)
Potassium: 4.3 mmol/L (ref 3.5–5.2)
Sodium: 139 mmol/L (ref 134–144)
Total Protein: 6 g/dL (ref 6.0–8.5)

## 2018-06-12 LAB — CBC WITH DIFFERENTIAL/PLATELET
BASOS: 0 %
Basophils Absolute: 0 10*3/uL (ref 0.0–0.2)
EOS (ABSOLUTE): 0.1 10*3/uL (ref 0.0–0.4)
EOS: 0 %
Hematocrit: 31.7 % — ABNORMAL LOW (ref 34.0–46.6)
Hemoglobin: 9.7 g/dL — ABNORMAL LOW (ref 11.1–15.9)
Lymphocytes Absolute: 1.7 10*3/uL (ref 0.7–3.1)
Lymphs: 14 %
MCH: 19.4 pg — AB (ref 26.6–33.0)
MCHC: 30.6 g/dL — AB (ref 31.5–35.7)
MCV: 63 fL — ABNORMAL LOW (ref 79–97)
MONOS ABS: 1.1 10*3/uL — AB (ref 0.1–0.9)
Monocytes: 9 %
Neutrophils Absolute: 9.6 10*3/uL — ABNORMAL HIGH (ref 1.4–7.0)
Neutrophils: 77 %
Platelets: 471 10*3/uL — ABNORMAL HIGH (ref 150–450)
RBC: 5 x10E6/uL (ref 3.77–5.28)
RDW: 22.7 % — AB (ref 12.3–15.4)
WBC: 12.5 10*3/uL — ABNORMAL HIGH (ref 3.4–10.8)

## 2018-06-12 LAB — LIPASE: LIPASE: 25 U/L (ref 14–72)

## 2018-06-12 NOTE — Telephone Encounter (Signed)
I called [facility] and I have to call and get prior authorizations from insurance on Monday.

## 2018-06-12 NOTE — Progress Notes (Signed)
Subjective: Chief Complaint  Patient presents with  . sob    sob not feeling well X 1    Here with some dyspnea, not feeling well.  Here with her husband.   Was moaning and groaning last night.  She hasn't taken medication in a few days.   Been feeling bad about a week.   She has hx/o diabetes and HTN.   No congestion, no ear pain, no sore throat, no cough, no NVD.   No body aches, no chills, no back pain, no abdominal pain.  Does not exercise regularly.  No chest pain.  No hx/o asthma.   No hx/o heart problems.  No sick contacts.     Cancer in remission.  Diabetes under control, she was just here recently to see Dr. Redmond School for hemoglobin A1c.  No other aggravating or relieving factors. No other complaint.   Past Medical History:  Diagnosis Date  . Allergic rhinitis   . Anemia   . Arthritis HX RIGHT ANKLE FX  . Cancer [facility])    breast, lumpectomy  . Depression    MAJOR  . Dyslipidemia   . GERD (gastroesophageal reflux disease)   . History of breast cancer DX 2010--  S/P LUMPECTOMY AND RADIATION -- NO RECURRENCE   ONCOLOGIST- DR Truddie Coco  . Hypertension   . Left flank pain   . Left ureteral calculus   . Migraines   . Schizophrenic disorder ([place])   . 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 Type II or unspecified type diabetes mellitus without mention of complication, not stated as uncontrolled 03/16/2014   Current Outpatient Medications on File Prior to Visit  Medication Sig Dispense Refill  . aspirin EC 81 MG EC tablet Take 1 tablet (81 mg total) by mouth daily. 30 tablet 0  . atorvastatin (LIPITOR) 20 MG tablet Take 1 tablet (20 mg total) by mouth daily. 90 tablet 3  . busPIRone (BUSPAR) 30 MG tablet Take 30 mg by mouth 2 (two) times daily.    . calcium-vitamin D (OSCAL WITH D) 250-125 MG-UNIT per tablet Take 2 tablets by mouth daily. 30 tablet 11  . LATUDA 120 MG TABS Take 1 tablet by mouth daily.  3  . levETIRAcetam (KEPPRA) 500 MG tablet TAKE  2 TABLETS (1,000 MG TOTAL) BY MOUTH 2 (TWO) TIMES DAILY. 360 tablet 3  . lisinopril (PRINIVIL,ZESTRIL) 5 MG tablet TAKE 1 TABLET (5 MG TOTAL) BY MOUTH DAILY. 90 tablet 3  . LORazepam (ATIVAN) 0.5 MG tablet Take 0.5-1 mg by mouth 2 (two) times daily. 1 tablet every morning, and 2 tablets at bedtime as needed    . omeprazole (PRILOSEC) 20 MG capsule Take 1 capsule (20 mg total) by mouth daily. 90 capsule 1  . ONETOUCH DELICA LANCETS FINE MISC DX: E11.9  Patient is to test one time a day 100 each 3  . venlafaxine XR (EFFEXOR-XR) 150 MG 24 hr capsule Take 300 mg by mouth daily.   11  . VIMPAT 100 MG TABS TAKE 1 TABLET BY MOUTH TWICE A DAY 180 tablet 1   No current facility-administered medications on file prior to visit.    ROS as in subjective   Objective: BP 130/80   Pulse (!) 107   Temp 98.7 F (37.1 C) (Oral)   Resp 16   Wt 166 lb 9.6 oz (75.6 kg)   SpO2 96%   BMI 30.47 kg/m   Wt Readings from Last 3 Encounters:  06/12/18  166 lb 9.6 oz (75.6 kg)  05/26/18 166 lb 12.8 oz (75.7 kg)  01/26/18 175 lb (79.4 kg)    BP Readings from Last 3 Encounters:  06/12/18 130/80  05/26/18 108/76  02/20/18 118/76   General appearance: alert, no distress, WD/WN, white female Somewhat hesitant to respond to questions, but otherwise pleasant and cooperative (hx/o mental health disorder) HEENT: normocephalic, sclerae anicteric, TMs pearly, nares patent, no discharge or erythema, pharynx normal Oral cavity: MMM, no lesions Neck: supple, no lymphadenopathy, no thyromegaly, no masses Heart: mildly tachycardic, otherwise RRR, normal S1, S2, no murmurs Lungs: CTA bilaterally, no wheezes, rhonchi, or rales Abdomen: +bs, soft, non tender, non distended, no masses, no hepatomegaly, no splenomegaly Pulses: 2+ symmetric, upper and lower extremities, normal cap refill Ext: no edema Back nontender    Adult ECG Report  Indication: SOB  Rate: 100 bpm  Rhythm: sinus tachycardia  QRS Axis: -71 degrees   PR Interval: unable to visualize P waves  QRS Duration: 153ms  QTc: 558ms  Conduction Disturbances: bifascicular block  Other Abnormalities: baseline interference  Patient's cardiac risk factors are: diabetes mellitus and dyslipidemia.  EKG comparison: 2018 EKG  Narrative Interpretation: no significant change from 2018 EKG   Assessment: Encounter Diagnoses  Name Primary?  . SOB (shortness of breath) Yes  . Malaise   . Type 2 diabetes mellitus without complication, without long-term current use of insulin ([place])   . Hypertension associated with diabetes ([place])   . Abnormal EKG   . Elevated LFTs   . Leukocytosis, unspecified type   . Anemia, unspecified type      Plan: Etiology unclear  She has cardiac risk factors  EKG reviewed, although unchanged from 2018 EKG, abnormal.  UA reviewed  Await STAT labs   advised rest, hydration, and advised over weekend if worse SOB, chest pain, edema, then go to the ED.   Lataysha was seen today for sob.  Diagnoses and all orders for this visit:  SOB (shortness of breath) -     EKG 12-Lead -     CBC with Differential/Platelet -     Comprehensive metabolic panel  [place]  Type 2 diabetes mellitus without complication, without long-term current use of insulin (HCC) -     CBC with Differential/Platelet -     Comprehensive metabolic panel  Hypertension associated with diabetes (HCC) -     CBC with Differential/Platelet -     Comprehensive metabolic panel  Abnormal EKG  Elevated LFTs  Leukocytosis, unspecified type  Anemia, unspecified type  Other orders -     Lipase  f/u pending STAT labs  Addendum: Reviewed case with Dr. Redmond School by phone ALP and LFTs elevated.  She has chronically elevated WBC, chronic anemia Will plan CT abdomen pelvis, lipase normal today and await hepatitis panel. I called and discussed case with the family

## 2018-06-12 NOTE — Telephone Encounter (Signed)
Labs return pt would like to be called on cell phone of husband [phone] [facility]

## 2018-06-12 NOTE — Telephone Encounter (Signed)
Please scheduled CT abdomen pelvis with and without contrast ASAP

## 2018-06-13 LAB — HEPATITIS PANEL, ACUTE
HEP A IGM: NEGATIVE
HEP B C IGM: NEGATIVE
HEP B S AG: NEGATIVE
Hep C Virus Ab: <0.1 s/co ratio (ref 0.0–0.9)

## 2018-06-15 ENCOUNTER — Other Ambulatory Visit: Payer: Self-pay | Admitting: Medical

## 2018-06-15 ENCOUNTER — Other Ambulatory Visit: Payer: Self-pay

## 2018-06-15 DIAGNOSIS — R1084 Generalized abdominal pain: Secondary | ICD-10-CM

## 2018-06-15 DIAGNOSIS — D649 Anemia, unspecified: Secondary | ICD-10-CM

## 2018-06-15 DIAGNOSIS — R945 Abnormal results of liver function studies: Secondary | ICD-10-CM

## 2018-06-15 DIAGNOSIS — D72829 Elevated white blood cell count, unspecified: Secondary | ICD-10-CM

## 2018-06-15 NOTE — Telephone Encounter (Signed)
Patient has an appointment for CT scan on 06-16-18 at 350 for 410 visit at [place].  Patient needs to pick up contrast at [facility] before 5 pm

## 2018-06-15 NOTE — Telephone Encounter (Signed)
Patient's husband notified of appt.

## 2018-06-16 ENCOUNTER — Ambulatory Visit
Admission: RE | Admit: 2018-06-16 | Discharge: 2018-06-16 | Disposition: A | Discharge status: Home / Self Care | Payer: Medicare Other | Source: Ambulatory Visit | Attending: Medical | Admitting: Medical

## 2018-06-16 ENCOUNTER — Inpatient Hospital Stay: Admission: RE | Admit: 2018-06-16 | Payer: BC Managed Care – PPO | Source: Ambulatory Visit

## 2018-06-16 ENCOUNTER — Telehealth: Payer: Self-pay

## 2018-06-16 DIAGNOSIS — N200 Calculus of kidney: Secondary | ICD-10-CM | POA: Diagnosis not present

## 2018-06-16 DIAGNOSIS — R1084 Generalized abdominal pain: Secondary | ICD-10-CM

## 2018-06-16 DIAGNOSIS — R945 Abnormal results of liver function studies: Secondary | ICD-10-CM

## 2018-06-16 DIAGNOSIS — D649 Anemia, unspecified: Secondary | ICD-10-CM

## 2018-06-16 DIAGNOSIS — D72829 Elevated white blood cell count, unspecified: Secondary | ICD-10-CM

## 2018-06-16 MED ORDER — IOHEXOL 300 MG/ML  SOLN
100.0000 mL | Freq: Once | INTRAMUSCULAR | Status: AC | PRN
  Administered 2018-06-16: 100 mL via INTRAVENOUS

## 2018-06-16 NOTE — Telephone Encounter (Signed)
Good work, lets try to get the CT STAT Wednesday!

## 2018-06-16 NOTE — Telephone Encounter (Signed)
Spoke with Anitra at [employer] to get prior auth for CT abdomin with contrast was told that a pier to pier needed to be completed to get [place].  Audelia Acton called the number given and no one answered, he left message and no one called back.     CT was schedule for 06-16-18 and was cancelled by [facility] because of no prior approval.

## 2018-06-16 NOTE — Telephone Encounter (Signed)
Called back to [employer] to get prior auth.  Valid 06-15-18 to 07-14-18  #[id number].

## 2018-06-17 NOTE — Telephone Encounter (Signed)
CT scan was done yesterday.

## 2018-06-20 ENCOUNTER — Emergency Department (HOSPITAL_COMMUNITY)
Admission: EM | Admit: 2018-06-20 | Discharge: 2018-06-20 | Disposition: A | Discharge status: Home / Self Care | Payer: Medicare Other | Source: Home / Self Care | Attending: Emergency Medicine | Admitting: Emergency Medicine

## 2018-06-20 ENCOUNTER — Emergency Department (HOSPITAL_COMMUNITY): Payer: Medicare Other

## 2018-06-20 ENCOUNTER — Encounter (HOSPITAL_COMMUNITY): Payer: Self-pay | Admitting: Emergency Medicine

## 2018-06-20 DIAGNOSIS — Z853 Personal history of malignant neoplasm of breast: Secondary | ICD-10-CM

## 2018-06-20 DIAGNOSIS — G9341 Metabolic encephalopathy: Secondary | ICD-10-CM | POA: Diagnosis not present

## 2018-06-20 DIAGNOSIS — J90 Pleural effusion, not elsewhere classified: Secondary | ICD-10-CM | POA: Diagnosis not present

## 2018-06-20 DIAGNOSIS — Z79899 Other long term (current) drug therapy: Secondary | ICD-10-CM

## 2018-06-20 DIAGNOSIS — K7200 Acute and subacute hepatic failure without coma: Secondary | ICD-10-CM | POA: Diagnosis not present

## 2018-06-20 DIAGNOSIS — R1011 Right upper quadrant pain: Secondary | ICD-10-CM

## 2018-06-20 DIAGNOSIS — I10 Essential (primary) hypertension: Secondary | ICD-10-CM

## 2018-06-20 DIAGNOSIS — K631 Perforation of intestine (nontraumatic): Secondary | ICD-10-CM | POA: Diagnosis not present

## 2018-06-20 DIAGNOSIS — I63411 Cerebral infarction due to embolism of right middle cerebral artery: Secondary | ICD-10-CM | POA: Diagnosis not present

## 2018-06-20 DIAGNOSIS — R1010 Upper abdominal pain, unspecified: Secondary | ICD-10-CM

## 2018-06-20 DIAGNOSIS — E860 Dehydration: Secondary | ICD-10-CM | POA: Diagnosis not present

## 2018-06-20 DIAGNOSIS — I480 Paroxysmal atrial fibrillation: Secondary | ICD-10-CM | POA: Diagnosis not present

## 2018-06-20 DIAGNOSIS — N200 Calculus of kidney: Secondary | ICD-10-CM | POA: Diagnosis not present

## 2018-06-20 DIAGNOSIS — R112 Nausea with vomiting, unspecified: Secondary | ICD-10-CM | POA: Diagnosis not present

## 2018-06-20 DIAGNOSIS — E119 Type 2 diabetes mellitus without complications: Secondary | ICD-10-CM

## 2018-06-20 DIAGNOSIS — I110 Hypertensive heart disease with heart failure: Secondary | ICD-10-CM | POA: Diagnosis not present

## 2018-06-20 DIAGNOSIS — R110 Nausea: Secondary | ICD-10-CM | POA: Diagnosis not present

## 2018-06-20 DIAGNOSIS — R0602 Shortness of breath: Secondary | ICD-10-CM | POA: Diagnosis not present

## 2018-06-20 DIAGNOSIS — K767 Hepatorenal syndrome: Secondary | ICD-10-CM | POA: Diagnosis not present

## 2018-06-20 DIAGNOSIS — E876 Hypokalemia: Secondary | ICD-10-CM | POA: Diagnosis not present

## 2018-06-20 LAB — CBC WITH DIFFERENTIAL/PLATELET
BASOS PCT: 0 %
Basophils Absolute: 0 10*3/uL (ref 0.0–0.1)
EOS PCT: 1 %
Eosinophils Absolute: 0.1 10*3/uL (ref 0.0–0.7)
HCT: 36.5 % (ref 36.0–46.0)
HEMOGLOBIN: 10.5 g/dL — AB (ref 12.0–15.0)
LYMPHS PCT: 17 %
Lymphs Abs: 1.5 10*3/uL (ref 0.7–4.0)
MCH: 19.1 pg — AB (ref 26.0–34.0)
MCHC: 28.8 g/dL — AB (ref 30.0–36.0)
MCV: 66.5 fL — AB (ref 78.0–100.0)
Monocytes Absolute: 0.6 10*3/uL (ref 0.1–1.0)
Monocytes Relative: 7 %
NEUTROS ABS: 6.5 10*3/uL (ref 1.7–7.7)
Neutrophils Relative %: 75 %
Platelets: 556 10*3/uL — ABNORMAL HIGH (ref 150–400)
RBC: 5.49 MIL/uL — ABNORMAL HIGH (ref 3.87–5.11)
RDW: 19.2 % — ABNORMAL HIGH (ref 11.5–15.5)
WBC: 8.7 10*3/uL (ref 4.0–10.5)

## 2018-06-20 LAB — COMPREHENSIVE METABOLIC PANEL
ALBUMIN: 3.3 g/dL — AB (ref 3.5–5.0)
ALK PHOS: 267 U/L — AB (ref 38–126)
ALT: 163 U/L — ABNORMAL HIGH (ref 0–44)
AST: 51 U/L — AB (ref 15–41)
Anion gap: 12 (ref 5–15)
BILIRUBIN TOTAL: 1.3 mg/dL — AB (ref 0.3–1.2)
BUN: 16 mg/dL (ref 8–23)
CALCIUM: 9 mg/dL (ref 8.9–10.3)
CO2: 26 mmol/L (ref 22–32)
Chloride: 100 mmol/L (ref 98–111)
Creatinine, Ser: 0.83 mg/dL (ref 0.44–1.00)
GFR calc Af Amer: >60 mL/min (ref >60)
GFR calc non Af Amer: >60 mL/min (ref >60)
GLUCOSE: 159 mg/dL — AB (ref 70–99)
POTASSIUM: 3.5 mmol/L (ref 3.5–5.1)
Sodium: 138 mmol/L (ref 135–145)
Total Protein: 6 g/dL — ABNORMAL LOW (ref 6.5–8.1)

## 2018-06-20 LAB — PROTIME-INR
INR: 1.28
Prothrombin Time: 15.9 seconds — ABNORMAL HIGH (ref 11.4–15.2)

## 2018-06-20 LAB — LIPASE, BLOOD: Lipase: 35 U/L (ref 11–51)

## 2018-06-20 MED ORDER — ONDANSETRON 4 MG PO TBDP: 4.0000 mg | ORAL_TABLET | Freq: Three times a day (TID) | ORAL | 0 refills | Status: DC | PRN

## 2018-06-20 MED ORDER — SODIUM CHLORIDE 0.9 % IV BOLUS
500.0000 mL | Freq: Once | INTRAVENOUS | Status: AC
  Administered 2018-06-20: 500 mL via INTRAVENOUS

## 2018-06-20 MED ORDER — ONDANSETRON HCL 4 MG/2ML IJ SOLN
4.0000 mg | Freq: Once | INTRAMUSCULAR | Status: AC
  Administered 2018-06-20: 4 mg via INTRAVENOUS
  Filled 2018-06-20: qty 2

## 2018-06-20 MED ORDER — DICYCLOMINE HCL 20 MG PO TABS: 20.0000 mg | ORAL_TABLET | Freq: Three times a day (TID) | ORAL | 0 refills | Status: DC | PRN

## 2018-06-20 NOTE — ED Triage Notes (Signed)
Patient reports she was seen at PCP on Monday for generalized abdominal pain and nausea. Reports CT scan scheduled for Tuesday. States CT showed cirrhosis. Today c/o continued abdominal pain, nausea, vomiting, and poor appetite.

## 2018-06-20 NOTE — Discharge Instructions (Signed)

## 2018-06-20 NOTE — ED Provider Notes (Signed)
Emergency Department Provider Note   I have reviewed the triage vital signs and the nursing notes.   HISTORY  Chief Complaint Abdominal Pain   HPI Brandi [name] is a 62 y.o. female with PMH of HLD, GERD, HTN, DM [place] to the emergency department for evaluation of worsening abdominal pain over the past week.  The patient had 2 episodes of vomiting yesterday.  She describes the pain is intermittent and bandlike across the middle abdomen.  She denies any pain worse on the right or left.  No dysuria, hesitancy, urgency.  No back pain.  Patient saw her primary care physician and had a CT scan which showed possible cirrhosis.  She has an appointment to follow with her PCP on Monday to discuss next steps regarding this.  The patient's husband states that with her pain worsening over the past 24 hours they presented to the emergency department.  She denies any associated diarrhea. She also notes some mild acute on chronic SOB without CP. No modifying factors. No productive cough or hemoptysis.   Past Medical History:  Diagnosis Date  . Allergic rhinitis   . Anemia   . Arthritis HX RIGHT ANKLE FX  . Cancer [facility])    breast, lumpectomy  . Depression    MAJOR  . Dyslipidemia   . GERD (gastroesophageal reflux disease)   . History of breast cancer DX 2010--  S/P LUMPECTOMY AND RADIATION -- NO RECURRENCE   ONCOLOGIST- DR Truddie Coco  . Hypertension   . Left flank pain   . Left ureteral calculus   . Migraines   . Schizophrenic disorder ([place])   . 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 Type II or unspecified type diabetes mellitus without mention of complication, not stated as uncontrolled 03/16/2014    Patient Active Problem List   Diagnosis Date Noted  . SOB (shortness of breath) 06/12/2018  . Abnormal EKG 06/12/2018  . Elevated LFTs 06/12/2018  . Malaise 06/12/2018  . Hypertension associated with diabetes ([place]) 12/05/2016  . Anemia 12/05/2016  .  Convulsions/seizures ([place]) 11/02/2014  . Type 2 diabetes mellitus without complication, without long-term current use of insulin ([place]) 03/16/2014  . Leukocytosis 06/29/2013  . Thrombocytosis ([place]) 06/29/2013  . Major depression 09/10/2011  . Migraine headache 09/10/2011  . Obesity (BMI 30-39.9) 09/10/2011  . GERD (gastroesophageal reflux disease) 09/10/2011  . Allergic rhinitis due to pollen 09/10/2011  . History of breast cancer in female 09/10/2011  . Hyperlipidemia associated with type 2 diabetes mellitus ([place]) 09/10/2011    Past Surgical History:  Procedure Laterality Date  . BREAST SURGERY  01-04-2009  DR Margot Chimes   LEFT BREAST LUMPECTOMY W/ SLN DISSECTION  FOR CANCER  . CATARACT EXTRACTION, BILATERAL Bilateral   . CESAREAN SECTION  1998  . CHILD BIRTH     C-SECTION  . CYSTOSCOPY WITH RETROGRADE PYELOGRAM, URETEROSCOPY AND STENT PLACEMENT  11/10/2012   Procedure: CYSTOSCOPY WITH RETROGRADE PYELOGRAM, URETEROSCOPY AND STENT PLACEMENT;  Surgeon: Hanley Ben, MD;  Location: [facility];  Service: Urology;  Laterality: Left;  WITH DOUBLE J STENT  . HOLMIUM LASER APPLICATION  26/83/4196   Procedure: HOLMIUM LASER APPLICATION;  Surgeon: Hanley Ben, MD;  Location: [facility];  Service: Urology;  Laterality: Left;  . LAPAROSCOPIC CHOLECYSTECTOMY  1990'S     Allergies Penicillins  Family History  Problem Relation Age of Onset  . Cancer Sister        breast  . Cancer  Mother        breast  . Cancer Father        lung  . Mental illness Maternal Uncle   . Cancer Maternal Grandmother   . Stroke Maternal Grandmother   . Tuberculosis Maternal Grandfather   . Cancer Paternal Grandmother   . Seizures Neg Hx     Social History Social History   Tobacco Use  . Smoking status: Never Smoker  . Smokeless tobacco: Never Used  Substance Use Topics  . Alcohol use: No  . Drug use: No    Review of Systems  Constitutional: No  fever/chills Eyes: No visual changes. ENT: No sore throat. Cardiovascular: Denies chest pain. Respiratory: Denies shortness of breath. Gastrointestinal: Positive abdominal pain.  No nausea, no vomiting.  No diarrhea.  No constipation. Genitourinary: Negative for dysuria. Musculoskeletal: Negative for back pain. Skin: Negative for rash. Neurological: Negative for headaches, focal weakness or numbness.  10-point ROS otherwise negative.  ____________________________________________   PHYSICAL EXAM:  VITAL SIGNS: ED Triage Vitals [06/20/18 1133]  Enc Vitals Group     BP 133/89     Pulse Rate 66     Resp 17     Temp 99.5 F (37.5 C)     Temp Source Oral     SpO2 97 %   Constitutional: Alert and oriented. Well appearing and in no acute distress. Eyes: Conjunctivae are normal. Head: Atraumatic. Nose: No congestion/rhinnorhea. Mouth/Throat: Mucous membranes are moist.  Neck: No stridor.  Cardiovascular: Normal rate, regular rhythm. Good peripheral circulation. Grossly normal heart sounds.   Respiratory: Normal respiratory effort.  No retractions. Lungs CTAB. Gastrointestinal: Soft and nontender. No distention.  Musculoskeletal: No lower extremity tenderness nor edema. No gross deformities of extremities. Neurologic: No gross focal neurologic deficits are appreciated.  Skin:  Skin is warm, dry and intact. No rash noted.  ____________________________________________   LABS (all labs ordered are listed, but only abnormal results are displayed)  Labs Reviewed  COMPREHENSIVE METABOLIC PANEL - Abnormal; Notable for the following components:      Result Value   Glucose, Bld 159 (*)    Total Protein 6.0 (*)    Albumin 3.3 (*)    AST 51 (*)    ALT 163 (*)    Alkaline Phosphatase 267 (*)    Total Bilirubin 1.3 (*)    All other components within normal limits  CBC WITH DIFFERENTIAL/PLATELET - Abnormal; Notable for the following components:   RBC 5.49 (*)    Hemoglobin 10.5  (*)    MCV 66.5 (*)    MCH 19.1 (*)    MCHC 28.8 (*)    RDW 19.2 (*)    Platelets 556 (*)    All other components within normal limits  PROTIME-INR - Abnormal; Notable for the following components:   Prothrombin Time 15.9 (*)    All other components within normal limits  URINE CULTURE  LIPASE, BLOOD  URINALYSIS, ROUTINE W REFLEX MICROSCOPIC   ____________________________________________  RADIOLOGY  Dg Chest 2 View  Result Date: 06/20/2018 CLINICAL DATA:  Nausea and vomiting.  Shortness of breath. EXAM: CHEST - 2 VIEW COMPARISON:  December 25, 2016 FINDINGS: The heart size is borderline to mildly enlarged. The hila and mediastinum are normal. No pneumothorax. No pulmonary nodules, masses, or focal infiltrates. Apparent tiny left effusion. IMPRESSION: Apparent tiny left effusion.  No other abnormalities. Electronically Signed   By: Dorise Bullion III M.D   On: 06/20/2018 12:38   US Abdomen Limited Ruq  Result Date: 06/20/2018 CLINICAL DATA:  RIGHT upper quadrant pain. EXAM: ULTRASOUND ABDOMEN LIMITED RIGHT UPPER QUADRANT COMPARISON:  CT abdomen and pelvis 06/16/2018. FINDINGS: Gallbladder: Surgically absent. Common bile duct: Diameter: 4.2 mm, normal. Liver: Increased echogenicity, with nodular contour. Portal vein is patent on color Doppler imaging with normal direction of blood flow towards the liver. IMPRESSION: Possible cirrhosis.  Increased echogenicity with nodular contour. Gallbladder surgically absent, no biliary ductal dilatation. Electronically Signed   By: Staci Righter M.D.   On: 06/20/2018 14:04    ____________________________________________   PROCEDURES  Procedure(s) performed:   Procedures  None  ____________________________________________   INITIAL IMPRESSION / ASSESSMENT AND PLAN / ED COURSE  Pertinent labs & imaging results that were available during my care of the patient were reviewed by me and considered in my medical decision making (see chart for  details).  She presents to the emergency department with bandlike abdominal pain and CT scan from PCP showing possible cirrhosis.  No abdominal pain at the time of my evaluation.  She does endorse some mild nausea.  Plan for labs, coags, UA, reassess after IV fluids and Zofran.   Patient with down-trending LFTs US of the RUQ shows a surgically absent gallbladder. No dilation of biliary system on [place]. Patient with cirrhosis and has scheduled f/u with PCP regarding this on Monday. Feeling better here after Zofran.   At this time, I do not feel there is any life-threatening condition present. I have reviewed and discussed all results (EKG, imaging, lab, urine as appropriate), exam findings with patient. I have reviewed nursing notes and appropriate previous records.  I feel the patient is safe to be discharged home without further emergent workup. Discussed usual and customary return precautions. Patient and family (if present) verbalize understanding and are comfortable with this plan.  Patient will follow-up with their primary care provider. If they do not have a primary care provider, information for follow-up has been provided to them. All questions have been answered.  ____________________________________________  FINAL CLINICAL IMPRESSION(S) / ED DIAGNOSES  Final diagnoses:  RUQ pain  Nausea     MEDICATIONS GIVEN DURING THIS VISIT:  Medications  ondansetron (ZOFRAN) injection 4 mg (4 mg Intravenous Given 06/20/18 1204)  sodium chloride 0.9 % bolus 500 mL (0 mLs Intravenous Stopped 06/20/18 1310)     NEW OUTPATIENT MEDICATIONS STARTED DURING THIS VISIT:  Discharge Medication List as of 06/20/2018  2:20 PM    START taking these medications   Details  dicyclomine (BENTYL) 20 MG tablet Take 1 tablet (20 mg total) by mouth every 8 (eight) hours as needed for spasms., Starting Sat 06/20/2018, Print    ondansetron (ZOFRAN ODT) 4 MG disintegrating tablet Take 1 tablet (4 mg total) by mouth every  8 (eight) hours as needed for nausea or vomiting., Starting Sat 06/20/2018, Print        Note:  This document was prepared using Dragon voice recognition software and may include unintentional dictation errors.  Nanda Quinton, MD Emergency Medicine    Long, Wonda Olds, MD 06/20/18 [phone]

## 2018-06-22 ENCOUNTER — Encounter: Payer: Self-pay | Admitting: Family Medicine

## 2018-06-22 ENCOUNTER — Ambulatory Visit (INDEPENDENT_AMBULATORY_CARE_PROVIDER_SITE_OTHER): Payer: Medicare Other | Admitting: Family Medicine

## 2018-06-22 ENCOUNTER — Encounter (HOSPITAL_COMMUNITY): Payer: Self-pay | Admitting: Family Medicine

## 2018-06-22 VITALS — BP 112/76 | HR 94 | Temp 97.6°F | Wt 159.6 lb

## 2018-06-22 DIAGNOSIS — N200 Calculus of kidney: Secondary | ICD-10-CM | POA: Diagnosis present

## 2018-06-22 DIAGNOSIS — Z853 Personal history of malignant neoplasm of breast: Secondary | ICD-10-CM

## 2018-06-22 DIAGNOSIS — R5381 Other malaise: Secondary | ICD-10-CM

## 2018-06-22 DIAGNOSIS — J150 Pneumonia due to Klebsiella pneumoniae: Secondary | ICD-10-CM | POA: Diagnosis not present

## 2018-06-22 DIAGNOSIS — Z9114 Patient's other noncompliance with medication regimen: Secondary | ICD-10-CM

## 2018-06-22 DIAGNOSIS — I4892 Unspecified atrial flutter: Secondary | ICD-10-CM | POA: Diagnosis not present

## 2018-06-22 DIAGNOSIS — Z801 Family history of malignant neoplasm of trachea, bronchus and lung: Secondary | ICD-10-CM

## 2018-06-22 DIAGNOSIS — R4701 Aphasia: Secondary | ICD-10-CM | POA: Diagnosis not present

## 2018-06-22 DIAGNOSIS — I420 Dilated cardiomyopathy: Secondary | ICD-10-CM | POA: Diagnosis not present

## 2018-06-22 DIAGNOSIS — K631 Perforation of intestine (nontraumatic): Secondary | ICD-10-CM | POA: Diagnosis not present

## 2018-06-22 DIAGNOSIS — Z8781 Personal history of (healed) traumatic fracture: Secondary | ICD-10-CM

## 2018-06-22 DIAGNOSIS — R748 Abnormal levels of other serum enzymes: Secondary | ICD-10-CM

## 2018-06-22 DIAGNOSIS — I63411 Cerebral infarction due to embolism of right middle cerebral artery: Secondary | ICD-10-CM | POA: Diagnosis not present

## 2018-06-22 DIAGNOSIS — K7581 Nonalcoholic steatohepatitis (NASH): Secondary | ICD-10-CM | POA: Diagnosis present

## 2018-06-22 DIAGNOSIS — N179 Acute kidney failure, unspecified: Secondary | ICD-10-CM | POA: Diagnosis not present

## 2018-06-22 DIAGNOSIS — A419 Sepsis, unspecified organism: Secondary | ICD-10-CM | POA: Diagnosis not present

## 2018-06-22 DIAGNOSIS — R110 Nausea: Secondary | ICD-10-CM

## 2018-06-22 DIAGNOSIS — K767 Hepatorenal syndrome: Secondary | ICD-10-CM | POA: Diagnosis not present

## 2018-06-22 DIAGNOSIS — K7210 Chronic hepatic failure without coma: Secondary | ICD-10-CM | POA: Diagnosis not present

## 2018-06-22 DIAGNOSIS — K559 Vascular disorder of intestine, unspecified: Secondary | ICD-10-CM | POA: Diagnosis not present

## 2018-06-22 DIAGNOSIS — I480 Paroxysmal atrial fibrillation: Secondary | ICD-10-CM | POA: Diagnosis not present

## 2018-06-22 DIAGNOSIS — G9341 Metabolic encephalopathy: Secondary | ICD-10-CM | POA: Diagnosis not present

## 2018-06-22 DIAGNOSIS — J9811 Atelectasis: Secondary | ICD-10-CM | POA: Diagnosis not present

## 2018-06-22 DIAGNOSIS — E669 Obesity, unspecified: Secondary | ICD-10-CM | POA: Diagnosis present

## 2018-06-22 DIAGNOSIS — D6959 Other secondary thrombocytopenia: Secondary | ICD-10-CM | POA: Diagnosis not present

## 2018-06-22 DIAGNOSIS — Z880 Allergy status to penicillin: Secondary | ICD-10-CM

## 2018-06-22 DIAGNOSIS — K219 Gastro-esophageal reflux disease without esophagitis: Secondary | ICD-10-CM | POA: Diagnosis present

## 2018-06-22 DIAGNOSIS — Z87442 Personal history of urinary calculi: Secondary | ICD-10-CM

## 2018-06-22 DIAGNOSIS — E876 Hypokalemia: Secondary | ICD-10-CM | POA: Diagnosis present

## 2018-06-22 DIAGNOSIS — E8881 Metabolic syndrome: Secondary | ICD-10-CM | POA: Diagnosis present

## 2018-06-22 DIAGNOSIS — Z823 Family history of stroke: Secondary | ICD-10-CM

## 2018-06-22 DIAGNOSIS — D509 Iron deficiency anemia, unspecified: Secondary | ICD-10-CM | POA: Diagnosis present

## 2018-06-22 DIAGNOSIS — R112 Nausea with vomiting, unspecified: Secondary | ICD-10-CM | POA: Diagnosis not present

## 2018-06-22 DIAGNOSIS — E1169 Type 2 diabetes mellitus with other specified complication: Secondary | ICD-10-CM | POA: Diagnosis present

## 2018-06-22 DIAGNOSIS — I5083 High output heart failure: Secondary | ICD-10-CM | POA: Diagnosis not present

## 2018-06-22 DIAGNOSIS — J9601 Acute respiratory failure with hypoxia: Secondary | ICD-10-CM | POA: Diagnosis not present

## 2018-06-22 DIAGNOSIS — K567 Ileus, unspecified: Secondary | ICD-10-CM | POA: Diagnosis not present

## 2018-06-22 DIAGNOSIS — E874 Mixed disorder of acid-base balance: Secondary | ICD-10-CM | POA: Diagnosis not present

## 2018-06-22 DIAGNOSIS — R278 Other lack of coordination: Secondary | ICD-10-CM | POA: Diagnosis not present

## 2018-06-22 DIAGNOSIS — I272 Pulmonary hypertension, unspecified: Secondary | ICD-10-CM | POA: Diagnosis present

## 2018-06-22 DIAGNOSIS — I82411 Acute embolism and thrombosis of right femoral vein: Secondary | ICD-10-CM | POA: Diagnosis not present

## 2018-06-22 DIAGNOSIS — D638 Anemia in other chronic diseases classified elsewhere: Secondary | ICD-10-CM | POA: Diagnosis present

## 2018-06-22 DIAGNOSIS — R945 Abnormal results of liver function studies: Secondary | ICD-10-CM | POA: Diagnosis not present

## 2018-06-22 DIAGNOSIS — R29710 NIHSS score 10: Secondary | ICD-10-CM | POA: Diagnosis not present

## 2018-06-22 DIAGNOSIS — R6521 Severe sepsis with septic shock: Secondary | ICD-10-CM | POA: Diagnosis not present

## 2018-06-22 DIAGNOSIS — I82B11 Acute embolism and thrombosis of right subclavian vein: Secondary | ICD-10-CM | POA: Diagnosis not present

## 2018-06-22 DIAGNOSIS — I6789 Other cerebrovascular disease: Secondary | ICD-10-CM | POA: Diagnosis present

## 2018-06-22 DIAGNOSIS — E870 Hyperosmolality and hypernatremia: Secondary | ICD-10-CM | POA: Diagnosis not present

## 2018-06-22 DIAGNOSIS — E860 Dehydration: Secondary | ICD-10-CM | POA: Diagnosis not present

## 2018-06-22 DIAGNOSIS — F329 Major depressive disorder, single episode, unspecified: Secondary | ICD-10-CM | POA: Diagnosis present

## 2018-06-22 DIAGNOSIS — G40409 Other generalized epilepsy and epileptic syndromes, not intractable, without status epilepticus: Secondary | ICD-10-CM | POA: Diagnosis present

## 2018-06-22 DIAGNOSIS — Z7982 Long term (current) use of aspirin: Secondary | ICD-10-CM

## 2018-06-22 DIAGNOSIS — I493 Ventricular premature depolarization: Secondary | ICD-10-CM | POA: Diagnosis present

## 2018-06-22 DIAGNOSIS — Z6836 Body mass index (BMI) 36.0-36.9, adult: Secondary | ICD-10-CM

## 2018-06-22 DIAGNOSIS — K7200 Acute and subacute hepatic failure without coma: Secondary | ICD-10-CM | POA: Diagnosis not present

## 2018-06-22 DIAGNOSIS — G43909 Migraine, unspecified, not intractable, without status migrainosus: Secondary | ICD-10-CM | POA: Diagnosis present

## 2018-06-22 DIAGNOSIS — E871 Hypo-osmolality and hyponatremia: Secondary | ICD-10-CM | POA: Diagnosis not present

## 2018-06-22 DIAGNOSIS — I110 Hypertensive heart disease with heart failure: Principal | ICD-10-CM | POA: Diagnosis present

## 2018-06-22 DIAGNOSIS — G2401 Drug induced subacute dyskinesia: Secondary | ICD-10-CM | POA: Diagnosis not present

## 2018-06-22 DIAGNOSIS — E878 Other disorders of electrolyte and fluid balance, not elsewhere classified: Secondary | ICD-10-CM | POA: Diagnosis not present

## 2018-06-22 DIAGNOSIS — I469 Cardiac arrest, cause unspecified: Secondary | ICD-10-CM | POA: Diagnosis not present

## 2018-06-22 DIAGNOSIS — E43 Unspecified severe protein-calorie malnutrition: Secondary | ICD-10-CM | POA: Diagnosis not present

## 2018-06-22 DIAGNOSIS — R188 Other ascites: Secondary | ICD-10-CM | POA: Diagnosis not present

## 2018-06-22 DIAGNOSIS — E11649 Type 2 diabetes mellitus with hypoglycemia without coma: Secondary | ICD-10-CM | POA: Diagnosis not present

## 2018-06-22 DIAGNOSIS — F259 Schizoaffective disorder, unspecified: Secondary | ICD-10-CM | POA: Diagnosis present

## 2018-06-22 DIAGNOSIS — Z803 Family history of malignant neoplasm of breast: Secondary | ICD-10-CM

## 2018-06-22 DIAGNOSIS — I5043 Acute on chronic combined systolic (congestive) and diastolic (congestive) heart failure: Secondary | ICD-10-CM | POA: Diagnosis present

## 2018-06-22 DIAGNOSIS — E7800 Pure hypercholesterolemia, unspecified: Secondary | ICD-10-CM | POA: Diagnosis present

## 2018-06-22 DIAGNOSIS — R7989 Other specified abnormal findings of blood chemistry: Secondary | ICD-10-CM

## 2018-06-22 DIAGNOSIS — D684 Acquired coagulation factor deficiency: Secondary | ICD-10-CM | POA: Diagnosis not present

## 2018-06-22 DIAGNOSIS — Z781 Physical restraint status: Secondary | ICD-10-CM

## 2018-06-22 DIAGNOSIS — F419 Anxiety disorder, unspecified: Secondary | ICD-10-CM | POA: Diagnosis present

## 2018-06-22 DIAGNOSIS — K7460 Unspecified cirrhosis of liver: Secondary | ICD-10-CM | POA: Diagnosis not present

## 2018-06-22 DIAGNOSIS — E785 Hyperlipidemia, unspecified: Secondary | ICD-10-CM | POA: Diagnosis present

## 2018-06-22 DIAGNOSIS — K5730 Diverticulosis of large intestine without perforation or abscess without bleeding: Secondary | ICD-10-CM | POA: Diagnosis present

## 2018-06-22 DIAGNOSIS — I4510 Unspecified right bundle-branch block: Secondary | ICD-10-CM | POA: Diagnosis present

## 2018-06-22 DIAGNOSIS — I440 Atrioventricular block, first degree: Secondary | ICD-10-CM | POA: Diagnosis present

## 2018-06-22 LAB — COMPREHENSIVE METABOLIC PANEL
ALBUMIN: 3.8 g/dL (ref 3.6–4.8)
ALT: 115 IU/L — ABNORMAL HIGH (ref 0–32)
AST: 35 IU/L (ref 0–40)
Albumin/Globulin Ratio: 1.8 (ref 1.2–2.2)
Alkaline Phosphatase: 307 IU/L — ABNORMAL HIGH (ref 39–117)
BUN / CREAT RATIO: 21 (ref 12–28)
BUN: 19 mg/dL (ref 8–27)
Bilirubin Total: 1.1 mg/dL (ref 0.0–1.2)
CALCIUM: 9.7 mg/dL (ref 8.7–10.3)
CO2: 30 mmol/L — AB (ref 20–29)
CREATININE: 0.89 mg/dL (ref 0.57–1.00)
Chloride: 99 mmol/L (ref 96–106)
GFR calc Af Amer: 80 mL/min/{1.73_m2} (ref >59)
GFR, EST NON AFRICAN AMERICAN: 70 mL/min/{1.73_m2} (ref >59)
Globulin, Total: 2.1 g/dL (ref 1.5–4.5)
Glucose: 139 mg/dL — ABNORMAL HIGH (ref 65–99)
Potassium: 4.4 mmol/L (ref 3.5–5.2)
Sodium: 138 mmol/L (ref 134–144)
Total Protein: 5.9 g/dL — ABNORMAL LOW (ref 6.0–8.5)

## 2018-06-22 LAB — CBC WITH DIFFERENTIAL/PLATELET
BASOS ABS: 0.1 10*3/uL (ref 0.0–0.2)
Basos: 0 %
EOS (ABSOLUTE): 0.2 10*3/uL (ref 0.0–0.4)
Eos: 1 %
HEMOGLOBIN: 9.9 g/dL — AB (ref 11.1–15.9)
Hematocrit: 32.8 % — ABNORMAL LOW (ref 34.0–46.6)
LYMPHS: 14 %
Lymphocytes Absolute: 2.1 10*3/uL (ref 0.7–3.1)
MCH: 19.3 pg — ABNORMAL LOW (ref 26.6–33.0)
MCHC: 30.2 g/dL — ABNORMAL LOW (ref 31.5–35.7)
MCV: 64 fL — ABNORMAL LOW (ref 79–97)
Monocytes Absolute: 1.3 10*3/uL — ABNORMAL HIGH (ref 0.1–0.9)
Monocytes: 8 %
NEUTROS PCT: 77 %
Neutrophils Absolute: 11.5 10*3/uL — ABNORMAL HIGH (ref 1.4–7.0)
Platelets: 555 10*3/uL — ABNORMAL HIGH (ref 150–450)
RBC: 5.13 x10E6/uL (ref 3.77–5.28)
RDW: 22.9 % — ABNORMAL HIGH (ref 12.3–15.4)
WBC: 15.1 10*3/uL — ABNORMAL HIGH (ref 3.4–10.8)

## 2018-06-22 LAB — CBG MONITORING, ED: Glucose-Capillary: 105 mg/dL — ABNORMAL HIGH (ref 70–99)

## 2018-06-22 MED ORDER — ONDANSETRON HCL 4 MG PO TABS: 4.0000 mg | ORAL_TABLET | Freq: Three times a day (TID) | ORAL | 0 refills | Status: DC | PRN

## 2018-06-22 MED ORDER — OMEPRAZOLE 20 MG PO CPDR: 20.0000 mg | DELAYED_RELEASE_CAPSULE | Freq: Every day | ORAL | 1 refills | Status: DC

## 2018-06-22 NOTE — Addendum Note (Signed)
Addended by: Denita Lung on: 06/22/2018 04:49 PM   Modules accepted: Orders

## 2018-06-22 NOTE — ED Triage Notes (Signed)
Patient is from home and accompanied by spouse. He reports patient is nausea and having generalized weakness. Patient was earlier today by PCP for a follow from having an abd CT scan. Also, blood taken today but no results. Also, patient was on 07/06 at [facility] for generalized abd pain and nausea. States CT showed cirrhosis or enlarged heart.

## 2018-06-22 NOTE — Progress Notes (Addendum)
   Subjective:    Patient ID: Brandi [name], female    DOB: 06-05-1956, 62 y.o.   MRN: [id number]  HPI She is here for recheck.  She was seen here on June 28 for evaluation of shortness of breath, generalized abdominal pain.  A CT scan on July 2 did show possible cirrhosis.  The work-up did show greatly elevated liver enzymes.  Hepatitis screening tests were negative.  She was seen again in the emergency room for evaluation of continued abdominal distress.  The ER record including lab and x-rays was reviewed.  Her enzymes actually did come down.  Also her hemoglobin was slightly down.  She was given IV fluids and ondansetron as well as Bentyl.  They have been using the ondansetron but not the Bentyl.  She continues to complain of indigestion as well as malaise.  No fever, chills, vomiting or diarrhea.   Review of Systems     Objective:   Physical Exam Alert and quite pale appearing.  Cardiac exam shows a regular rhythm without murmurs or gallops.  Lungs are clear to auscultation.  Abdominal exam shows no bowel sounds, masses or tenderness.       Assessment & Plan:  Elevated LFTs - Plan: Comprehensive metabolic panel  Malaise - Plan: CBC with Differential/Platelet, Comprehensive metabolic panel, omeprazole (PRILOSEC) 20 MG capsule  Nausea - Plan: ondansetron (ZOFRAN) 4 MG tablet, CBC with Differential/Platelet, Comprehensive metabolic panel, omeprazole (PRILOSEC) 20 MG capsule I will get stat blood work on her as the etiology of this is unclear and she actually looks worse.  The enzymes came down. White blood count is now 15.1 with a hemoglobin of 9.9.  Alkaline phosphatase is now 307.  I will refer her to GI.

## 2018-06-23 ENCOUNTER — Emergency Department (HOSPITAL_COMMUNITY): Payer: Medicare Other

## 2018-06-23 ENCOUNTER — Telehealth: Payer: Self-pay

## 2018-06-23 ENCOUNTER — Inpatient Hospital Stay (HOSPITAL_COMMUNITY): Payer: Medicare Other

## 2018-06-23 ENCOUNTER — Encounter (HOSPITAL_COMMUNITY): Payer: Self-pay | Admitting: Emergency Medicine

## 2018-06-23 ENCOUNTER — Other Ambulatory Visit: Payer: Self-pay

## 2018-06-23 ENCOUNTER — Inpatient Hospital Stay (HOSPITAL_COMMUNITY)
Admission: EM | Admit: 2018-06-23 | Discharge: 2018-07-23 | DRG: 003 | Disposition: A | Discharge status: Other Acute Inpatient Hospital | Payer: Medicare Other | Attending: Pulmonary Disease | Admitting: Pulmonary Disease

## 2018-06-23 DIAGNOSIS — D72829 Elevated white blood cell count, unspecified: Secondary | ICD-10-CM | POA: Diagnosis not present

## 2018-06-23 DIAGNOSIS — G43909 Migraine, unspecified, not intractable, without status migrainosus: Secondary | ICD-10-CM | POA: Diagnosis present

## 2018-06-23 DIAGNOSIS — Z4682 Encounter for fitting and adjustment of non-vascular catheter: Secondary | ICD-10-CM | POA: Diagnosis not present

## 2018-06-23 DIAGNOSIS — R112 Nausea with vomiting, unspecified: Secondary | ICD-10-CM | POA: Diagnosis present

## 2018-06-23 DIAGNOSIS — K7290 Hepatic failure, unspecified without coma: Secondary | ICD-10-CM | POA: Diagnosis not present

## 2018-06-23 DIAGNOSIS — M7989 Other specified soft tissue disorders: Secondary | ICD-10-CM | POA: Diagnosis not present

## 2018-06-23 DIAGNOSIS — J9601 Acute respiratory failure with hypoxia: Secondary | ICD-10-CM | POA: Diagnosis not present

## 2018-06-23 DIAGNOSIS — Z432 Encounter for attention to ileostomy: Secondary | ICD-10-CM | POA: Diagnosis not present

## 2018-06-23 DIAGNOSIS — K7469 Other cirrhosis of liver: Secondary | ICD-10-CM | POA: Diagnosis not present

## 2018-06-23 DIAGNOSIS — F209 Schizophrenia, unspecified: Secondary | ICD-10-CM

## 2018-06-23 DIAGNOSIS — I6340 Cerebral infarction due to embolism of unspecified cerebral artery: Secondary | ICD-10-CM

## 2018-06-23 DIAGNOSIS — I10 Essential (primary) hypertension: Secondary | ICD-10-CM | POA: Diagnosis not present

## 2018-06-23 DIAGNOSIS — K5900 Constipation, unspecified: Secondary | ICD-10-CM | POA: Diagnosis not present

## 2018-06-23 DIAGNOSIS — I6789 Other cerebrovascular disease: Secondary | ICD-10-CM | POA: Diagnosis not present

## 2018-06-23 DIAGNOSIS — R945 Abnormal results of liver function studies: Secondary | ICD-10-CM

## 2018-06-23 DIAGNOSIS — E1169 Type 2 diabetes mellitus with other specified complication: Secondary | ICD-10-CM | POA: Diagnosis present

## 2018-06-23 DIAGNOSIS — E860 Dehydration: Secondary | ICD-10-CM

## 2018-06-23 DIAGNOSIS — I110 Hypertensive heart disease with heart failure: Secondary | ICD-10-CM | POA: Diagnosis not present

## 2018-06-23 DIAGNOSIS — D62 Acute posthemorrhagic anemia: Secondary | ICD-10-CM | POA: Diagnosis not present

## 2018-06-23 DIAGNOSIS — R4701 Aphasia: Secondary | ICD-10-CM | POA: Diagnosis not present

## 2018-06-23 DIAGNOSIS — K760 Fatty (change of) liver, not elsewhere classified: Secondary | ICD-10-CM | POA: Diagnosis not present

## 2018-06-23 DIAGNOSIS — J150 Pneumonia due to Klebsiella pneumoniae: Secondary | ICD-10-CM | POA: Diagnosis not present

## 2018-06-23 DIAGNOSIS — I63411 Cerebral infarction due to embolism of right middle cerebral artery: Secondary | ICD-10-CM

## 2018-06-23 DIAGNOSIS — N179 Acute kidney failure, unspecified: Secondary | ICD-10-CM | POA: Diagnosis not present

## 2018-06-23 DIAGNOSIS — R198 Other specified symptoms and signs involving the digestive system and abdomen: Secondary | ICD-10-CM | POA: Diagnosis not present

## 2018-06-23 DIAGNOSIS — F323 Major depressive disorder, single episode, severe with psychotic features: Secondary | ICD-10-CM | POA: Diagnosis not present

## 2018-06-23 DIAGNOSIS — D649 Anemia, unspecified: Secondary | ICD-10-CM | POA: Diagnosis not present

## 2018-06-23 DIAGNOSIS — K567 Ileus, unspecified: Secondary | ICD-10-CM | POA: Diagnosis not present

## 2018-06-23 DIAGNOSIS — R1310 Dysphagia, unspecified: Secondary | ICD-10-CM | POA: Diagnosis not present

## 2018-06-23 DIAGNOSIS — Z87898 Personal history of other specified conditions: Secondary | ICD-10-CM | POA: Diagnosis not present

## 2018-06-23 DIAGNOSIS — R0902 Hypoxemia: Secondary | ICD-10-CM

## 2018-06-23 DIAGNOSIS — R579 Shock, unspecified: Secondary | ICD-10-CM | POA: Diagnosis not present

## 2018-06-23 DIAGNOSIS — Z9911 Dependence on respirator [ventilator] status: Secondary | ICD-10-CM

## 2018-06-23 DIAGNOSIS — I5042 Chronic combined systolic (congestive) and diastolic (congestive) heart failure: Secondary | ICD-10-CM | POA: Diagnosis not present

## 2018-06-23 DIAGNOSIS — K7200 Acute and subacute hepatic failure without coma: Secondary | ICD-10-CM | POA: Diagnosis not present

## 2018-06-23 DIAGNOSIS — A419 Sepsis, unspecified organism: Secondary | ICD-10-CM | POA: Diagnosis not present

## 2018-06-23 DIAGNOSIS — J181 Lobar pneumonia, unspecified organism: Secondary | ICD-10-CM | POA: Diagnosis not present

## 2018-06-23 DIAGNOSIS — E1159 Type 2 diabetes mellitus with other circulatory complications: Secondary | ICD-10-CM | POA: Diagnosis not present

## 2018-06-23 DIAGNOSIS — E871 Hypo-osmolality and hyponatremia: Secondary | ICD-10-CM | POA: Diagnosis not present

## 2018-06-23 DIAGNOSIS — L988 Other specified disorders of the skin and subcutaneous tissue: Secondary | ICD-10-CM | POA: Diagnosis not present

## 2018-06-23 DIAGNOSIS — I469 Cardiac arrest, cause unspecified: Secondary | ICD-10-CM | POA: Diagnosis not present

## 2018-06-23 DIAGNOSIS — N200 Calculus of kidney: Secondary | ICD-10-CM | POA: Diagnosis not present

## 2018-06-23 DIAGNOSIS — R109 Unspecified abdominal pain: Secondary | ICD-10-CM | POA: Diagnosis not present

## 2018-06-23 DIAGNOSIS — R188 Other ascites: Secondary | ICD-10-CM | POA: Diagnosis not present

## 2018-06-23 DIAGNOSIS — K922 Gastrointestinal hemorrhage, unspecified: Secondary | ICD-10-CM

## 2018-06-23 DIAGNOSIS — J189 Pneumonia, unspecified organism: Secondary | ICD-10-CM | POA: Diagnosis not present

## 2018-06-23 DIAGNOSIS — E119 Type 2 diabetes mellitus without complications: Secondary | ICD-10-CM | POA: Diagnosis not present

## 2018-06-23 DIAGNOSIS — F411 Generalized anxiety disorder: Secondary | ICD-10-CM | POA: Diagnosis not present

## 2018-06-23 DIAGNOSIS — E876 Hypokalemia: Secondary | ICD-10-CM

## 2018-06-23 DIAGNOSIS — I5043 Acute on chronic combined systolic (congestive) and diastolic (congestive) heart failure: Secondary | ICD-10-CM | POA: Diagnosis not present

## 2018-06-23 DIAGNOSIS — K7460 Unspecified cirrhosis of liver: Secondary | ICD-10-CM | POA: Diagnosis not present

## 2018-06-23 DIAGNOSIS — J9600 Acute respiratory failure, unspecified whether with hypoxia or hypercapnia: Secondary | ICD-10-CM

## 2018-06-23 DIAGNOSIS — K6389 Other specified diseases of intestine: Secondary | ICD-10-CM | POA: Diagnosis not present

## 2018-06-23 DIAGNOSIS — I517 Cardiomegaly: Secondary | ICD-10-CM | POA: Diagnosis not present

## 2018-06-23 DIAGNOSIS — R110 Nausea: Secondary | ICD-10-CM

## 2018-06-23 DIAGNOSIS — K767 Hepatorenal syndrome: Secondary | ICD-10-CM | POA: Diagnosis not present

## 2018-06-23 DIAGNOSIS — K920 Hematemesis: Secondary | ICD-10-CM | POA: Diagnosis not present

## 2018-06-23 DIAGNOSIS — I639 Cerebral infarction, unspecified: Secondary | ICD-10-CM | POA: Diagnosis not present

## 2018-06-23 DIAGNOSIS — D509 Iron deficiency anemia, unspecified: Secondary | ICD-10-CM | POA: Diagnosis not present

## 2018-06-23 DIAGNOSIS — J90 Pleural effusion, not elsewhere classified: Secondary | ICD-10-CM | POA: Diagnosis not present

## 2018-06-23 DIAGNOSIS — D689 Coagulation defect, unspecified: Secondary | ICD-10-CM | POA: Diagnosis not present

## 2018-06-23 DIAGNOSIS — I152 Hypertension secondary to endocrine disorders: Secondary | ICD-10-CM | POA: Diagnosis present

## 2018-06-23 DIAGNOSIS — S31109A Unspecified open wound of abdominal wall, unspecified quadrant without penetration into peritoneal cavity, initial encounter: Secondary | ICD-10-CM | POA: Diagnosis not present

## 2018-06-23 DIAGNOSIS — R0681 Apnea, not elsewhere classified: Secondary | ICD-10-CM

## 2018-06-23 DIAGNOSIS — R4020 Unspecified coma: Secondary | ICD-10-CM | POA: Diagnosis not present

## 2018-06-23 DIAGNOSIS — I82B11 Acute embolism and thrombosis of right subclavian vein: Secondary | ICD-10-CM | POA: Diagnosis not present

## 2018-06-23 DIAGNOSIS — E870 Hyperosmolality and hypernatremia: Secondary | ICD-10-CM | POA: Diagnosis not present

## 2018-06-23 DIAGNOSIS — Z978 Presence of other specified devices: Secondary | ICD-10-CM

## 2018-06-23 DIAGNOSIS — I4891 Unspecified atrial fibrillation: Secondary | ICD-10-CM | POA: Diagnosis not present

## 2018-06-23 DIAGNOSIS — R0602 Shortness of breath: Secondary | ICD-10-CM | POA: Diagnosis not present

## 2018-06-23 DIAGNOSIS — G9340 Encephalopathy, unspecified: Secondary | ICD-10-CM | POA: Diagnosis not present

## 2018-06-23 DIAGNOSIS — R1110 Vomiting, unspecified: Secondary | ICD-10-CM

## 2018-06-23 DIAGNOSIS — E874 Mixed disorder of acid-base balance: Secondary | ICD-10-CM | POA: Diagnosis not present

## 2018-06-23 DIAGNOSIS — R6521 Severe sepsis with septic shock: Secondary | ICD-10-CM | POA: Diagnosis not present

## 2018-06-23 DIAGNOSIS — Z4659 Encounter for fitting and adjustment of other gastrointestinal appliance and device: Secondary | ICD-10-CM

## 2018-06-23 DIAGNOSIS — R630 Anorexia: Secondary | ICD-10-CM | POA: Diagnosis not present

## 2018-06-23 DIAGNOSIS — E43 Unspecified severe protein-calorie malnutrition: Secondary | ICD-10-CM

## 2018-06-23 DIAGNOSIS — K55049 Acute infarction of large intestine, extent unspecified: Secondary | ICD-10-CM | POA: Diagnosis not present

## 2018-06-23 DIAGNOSIS — R918 Other nonspecific abnormal finding of lung field: Secondary | ICD-10-CM | POA: Diagnosis not present

## 2018-06-23 DIAGNOSIS — J9690 Respiratory failure, unspecified, unspecified whether with hypoxia or hypercapnia: Secondary | ICD-10-CM | POA: Diagnosis not present

## 2018-06-23 DIAGNOSIS — K5190 Ulcerative colitis, unspecified, without complications: Secondary | ICD-10-CM | POA: Diagnosis not present

## 2018-06-23 DIAGNOSIS — F329 Major depressive disorder, single episode, unspecified: Secondary | ICD-10-CM | POA: Diagnosis present

## 2018-06-23 DIAGNOSIS — I480 Paroxysmal atrial fibrillation: Secondary | ICD-10-CM

## 2018-06-23 DIAGNOSIS — Z853 Personal history of malignant neoplasm of breast: Secondary | ICD-10-CM

## 2018-06-23 DIAGNOSIS — R569 Unspecified convulsions: Secondary | ICD-10-CM | POA: Diagnosis not present

## 2018-06-23 DIAGNOSIS — R1084 Generalized abdominal pain: Secondary | ICD-10-CM | POA: Diagnosis not present

## 2018-06-23 DIAGNOSIS — I6523 Occlusion and stenosis of bilateral carotid arteries: Secondary | ICD-10-CM | POA: Diagnosis not present

## 2018-06-23 DIAGNOSIS — K631 Perforation of intestine (nontraumatic): Secondary | ICD-10-CM

## 2018-06-23 DIAGNOSIS — E785 Hyperlipidemia, unspecified: Secondary | ICD-10-CM | POA: Diagnosis not present

## 2018-06-23 DIAGNOSIS — K219 Gastro-esophageal reflux disease without esophagitis: Secondary | ICD-10-CM

## 2018-06-23 DIAGNOSIS — R4182 Altered mental status, unspecified: Secondary | ICD-10-CM | POA: Diagnosis not present

## 2018-06-23 DIAGNOSIS — G9341 Metabolic encephalopathy: Secondary | ICD-10-CM | POA: Diagnosis not present

## 2018-06-23 DIAGNOSIS — I481 Persistent atrial fibrillation: Secondary | ICD-10-CM | POA: Diagnosis not present

## 2018-06-23 DIAGNOSIS — R002 Palpitations: Secondary | ICD-10-CM | POA: Diagnosis not present

## 2018-06-23 DIAGNOSIS — I4892 Unspecified atrial flutter: Secondary | ICD-10-CM

## 2018-06-23 DIAGNOSIS — R7989 Other specified abnormal findings of blood chemistry: Secondary | ICD-10-CM | POA: Diagnosis present

## 2018-06-23 DIAGNOSIS — D684 Acquired coagulation factor deficiency: Secondary | ICD-10-CM | POA: Diagnosis not present

## 2018-06-23 DIAGNOSIS — Z9289 Personal history of other medical treatment: Secondary | ICD-10-CM

## 2018-06-23 DIAGNOSIS — E46 Unspecified protein-calorie malnutrition: Secondary | ICD-10-CM | POA: Diagnosis not present

## 2018-06-23 DIAGNOSIS — R740 Nonspecific elevation of levels of transaminase and lactic acid dehydrogenase [LDH]: Secondary | ICD-10-CM | POA: Diagnosis not present

## 2018-06-23 DIAGNOSIS — R5381 Other malaise: Secondary | ICD-10-CM | POA: Diagnosis present

## 2018-06-23 DIAGNOSIS — I69314 Frontal lobe and executive function deficit following cerebral infarction: Secondary | ICD-10-CM | POA: Diagnosis not present

## 2018-06-23 DIAGNOSIS — R17 Unspecified jaundice: Secondary | ICD-10-CM | POA: Diagnosis not present

## 2018-06-23 DIAGNOSIS — J9811 Atelectasis: Secondary | ICD-10-CM | POA: Diagnosis not present

## 2018-06-23 DIAGNOSIS — Z818 Family history of other mental and behavioral disorders: Secondary | ICD-10-CM | POA: Diagnosis not present

## 2018-06-23 DIAGNOSIS — I420 Dilated cardiomyopathy: Secondary | ICD-10-CM | POA: Diagnosis not present

## 2018-06-23 DIAGNOSIS — D500 Iron deficiency anemia secondary to blood loss (chronic): Secondary | ICD-10-CM | POA: Diagnosis not present

## 2018-06-23 DIAGNOSIS — D696 Thrombocytopenia, unspecified: Secondary | ICD-10-CM | POA: Diagnosis not present

## 2018-06-23 DIAGNOSIS — I82411 Acute embolism and thrombosis of right femoral vein: Secondary | ICD-10-CM | POA: Diagnosis not present

## 2018-06-23 DIAGNOSIS — R140 Abdominal distension (gaseous): Secondary | ICD-10-CM | POA: Diagnosis not present

## 2018-06-23 DIAGNOSIS — K769 Liver disease, unspecified: Secondary | ICD-10-CM | POA: Diagnosis not present

## 2018-06-23 DIAGNOSIS — E1151 Type 2 diabetes mellitus with diabetic peripheral angiopathy without gangrene: Secondary | ICD-10-CM | POA: Diagnosis not present

## 2018-06-23 DIAGNOSIS — T17908A Unspecified foreign body in respiratory tract, part unspecified causing other injury, initial encounter: Secondary | ICD-10-CM

## 2018-06-23 DIAGNOSIS — F259 Schizoaffective disorder, unspecified: Secondary | ICD-10-CM | POA: Diagnosis not present

## 2018-06-23 DIAGNOSIS — K7581 Nonalcoholic steatohepatitis (NASH): Secondary | ICD-10-CM | POA: Diagnosis not present

## 2018-06-23 HISTORY — DX: Acute on chronic combined systolic (congestive) and diastolic (congestive) heart failure: I50.43

## 2018-06-23 LAB — RAPID URINE DRUG SCREEN, HOSP PERFORMED
AMPHETAMINES: NOT DETECTED
BENZODIAZEPINES: NOT DETECTED
Cocaine: NOT DETECTED
OPIATES: NOT DETECTED
TETRAHYDROCANNABINOL: NOT DETECTED

## 2018-06-23 LAB — BRAIN NATRIURETIC PEPTIDE
B Natriuretic Peptide: 689 pg/mL — ABNORMAL HIGH (ref 0.0–100.0)
B Natriuretic Peptide: 607.3 pg/mL — ABNORMAL HIGH (ref 0.0–100.0)

## 2018-06-23 LAB — COMPREHENSIVE METABOLIC PANEL
ALBUMIN: 3.5 g/dL (ref 3.5–5.0)
ALK PHOS: 249 U/L — AB (ref 38–126)
ALT: 101 U/L — ABNORMAL HIGH (ref 0–44)
ANION GAP: 12 (ref 5–15)
AST: 40 U/L (ref 15–41)
BUN: 18 mg/dL (ref 8–23)
CO2: 27 mmol/L (ref 22–32)
Calcium: 9 mg/dL (ref 8.9–10.3)
Chloride: 98 mmol/L (ref 98–111)
Creatinine, Ser: 0.87 mg/dL (ref 0.44–1.00)
GFR calc Af Amer: >60 mL/min (ref >60)
GFR calc non Af Amer: >60 mL/min (ref >60)
GLUCOSE: 134 mg/dL — AB (ref 70–99)
POTASSIUM: 3.2 mmol/L — AB (ref 3.5–5.1)
SODIUM: 137 mmol/L (ref 135–145)
Total Bilirubin: 1.1 mg/dL (ref 0.3–1.2)
Total Protein: 6.4 g/dL — ABNORMAL LOW (ref 6.5–8.1)

## 2018-06-23 LAB — CBG MONITORING, ED: GLUCOSE-CAPILLARY: 136 mg/dL — AB (ref 70–99)

## 2018-06-23 LAB — GLUCOSE, CAPILLARY
GLUCOSE-CAPILLARY: 173 mg/dL — AB (ref 70–99)
GLUCOSE-CAPILLARY: 127 mg/dL — AB (ref 70–99)
Glucose-Capillary: 240 mg/dL — ABNORMAL HIGH (ref 70–99)

## 2018-06-23 LAB — URINALYSIS, ROUTINE W REFLEX MICROSCOPIC
Bilirubin Urine: NEGATIVE
GLUCOSE, UA: NEGATIVE mg/dL
Hgb urine dipstick: NEGATIVE
KETONES UR: NEGATIVE mg/dL
Leukocytes, UA: NEGATIVE
NITRITE: NEGATIVE
PROTEIN: NEGATIVE mg/dL
Specific Gravity, Urine: 1.014 (ref 1.005–1.030)
pH: 6 (ref 5.0–8.0)

## 2018-06-23 LAB — ECHOCARDIOGRAM COMPLETE
HEIGHTINCHES: 60 in
Weight: 2631.41 oz

## 2018-06-23 LAB — CBC
HEMATOCRIT: 35.1 % — AB (ref 36.0–46.0)
HEMOGLOBIN: 10.1 g/dL — AB (ref 12.0–15.0)
MCH: 19.2 pg — ABNORMAL LOW (ref 26.0–34.0)
MCHC: 28.8 g/dL — ABNORMAL LOW (ref 30.0–36.0)
MCV: 66.7 fL — ABNORMAL LOW (ref 78.0–100.0)
PLATELETS: 600 10*3/uL — AB (ref 150–400)
RBC: 5.26 MIL/uL — AB (ref 3.87–5.11)
RDW: 19.2 % — AB (ref 11.5–15.5)
WBC: 12.1 10*3/uL — ABNORMAL HIGH (ref 4.0–10.5)

## 2018-06-23 LAB — SALICYLATE LEVEL

## 2018-06-23 LAB — T4, FREE: Free T4: 0.94 ng/dL (ref 0.82–1.77)

## 2018-06-23 LAB — LIPASE, BLOOD: Lipase: 37 U/L (ref 11–51)

## 2018-06-23 LAB — PROTIME-INR
INR: 1.34
Prothrombin Time: 16.5 seconds — ABNORMAL HIGH (ref 11.4–15.2)

## 2018-06-23 LAB — TSH: TSH: 0.532 u[IU]/mL (ref 0.350–4.500)

## 2018-06-23 LAB — I-STAT TROPONIN, ED: TROPONIN I, POC: 0.02 ng/mL (ref 0.00–0.08)

## 2018-06-23 LAB — MAGNESIUM: MAGNESIUM: 1.8 mg/dL (ref 1.7–2.4)

## 2018-06-23 LAB — MRSA PCR SCREENING: MRSA by PCR: NEGATIVE

## 2018-06-23 LAB — ACETAMINOPHEN LEVEL: Acetaminophen (Tylenol), Serum: <10 ug/mL — ABNORMAL LOW (ref 10–30)

## 2018-06-23 MED ORDER — PERFLUTREN LIPID MICROSPHERE
1.0000 mL | INTRAVENOUS | Status: AC | PRN
  Administered 2018-06-23: 2 mL via INTRAVENOUS
  Filled 2018-06-23: qty 10

## 2018-06-23 MED ORDER — VENLAFAXINE HCL ER 75 MG PO CP24
300.0000 mg | ORAL_CAPSULE | Freq: Every day | ORAL | Status: DC
  Administered 2018-06-23 – 2018-06-28 (×5): 300 mg via ORAL
  Filled 2018-06-23 (×2): qty 4
  Filled 2018-06-23 (×2): qty 2
  Filled 2018-06-23: qty 4

## 2018-06-23 MED ORDER — BISACODYL 10 MG RE SUPP: 10.0000 mg | Freq: Every day | RECTAL | Status: DC | PRN

## 2018-06-23 MED ORDER — ONDANSETRON HCL 4 MG/2ML IJ SOLN
4.0000 mg | Freq: Once | INTRAMUSCULAR | Status: AC | PRN
  Administered 2018-06-23: 4 mg via INTRAVENOUS
  Filled 2018-06-23: qty 2

## 2018-06-23 MED ORDER — INSULIN ASPART 100 UNIT/ML ~~LOC~~ SOLN
0.0000 [IU] | Freq: Three times a day (TID) | SUBCUTANEOUS | Status: DC
  Administered 2018-06-23: 1 [IU] via SUBCUTANEOUS
  Administered 2018-06-23: 2 [IU] via SUBCUTANEOUS
  Administered 2018-06-24 – 2018-06-25 (×4): 1 [IU] via SUBCUTANEOUS
  Administered 2018-06-27: 2 [IU] via SUBCUTANEOUS
  Administered 2018-06-27 – 2018-06-28 (×2): 1 [IU] via SUBCUTANEOUS

## 2018-06-23 MED ORDER — HYDROCODONE-ACETAMINOPHEN 5-325 MG PO TABS: 1.0000 | ORAL_TABLET | ORAL | Status: DC | PRN

## 2018-06-23 MED ORDER — ONDANSETRON HCL 4 MG/2ML IJ SOLN
4.0000 mg | Freq: Four times a day (QID) | INTRAMUSCULAR | Status: DC | PRN
  Administered 2018-07-10 – 2018-07-12 (×4): 4 mg via INTRAVENOUS
  Filled 2018-06-23 (×4): qty 2

## 2018-06-23 MED ORDER — SENNOSIDES-DOCUSATE SODIUM 8.6-50 MG PO TABS: 1.0000 | ORAL_TABLET | Freq: Every evening | ORAL | Status: DC | PRN

## 2018-06-23 MED ORDER — SODIUM CHLORIDE 0.9 % IV BOLUS
500.0000 mL | Freq: Once | INTRAVENOUS | Status: AC
  Administered 2018-06-23: 500 mL via INTRAVENOUS

## 2018-06-23 MED ORDER — DILTIAZEM HCL-DEXTROSE 100-5 MG/100ML-% IV SOLN (PREMIX)
5.0000 mg/h | INTRAVENOUS | Status: DC
Start: 2018-06-23 — End: 2018-06-25
  Administered 2018-06-23: 6 mg/h via INTRAVENOUS
  Administered 2018-06-23: 5 mg/h via INTRAVENOUS
  Filled 2018-06-23 (×2): qty 100

## 2018-06-23 MED ORDER — ASPIRIN EC 81 MG PO TBEC
81.0000 mg | DELAYED_RELEASE_TABLET | Freq: Every day | ORAL | Status: DC
  Administered 2018-06-24 – 2018-06-25 (×2): 81 mg via ORAL
  Filled 2018-06-23 (×2): qty 1

## 2018-06-23 MED ORDER — FAMOTIDINE IN NACL 20-0.9 MG/50ML-% IV SOLN
20.0000 mg | Freq: Two times a day (BID) | INTRAVENOUS | Status: DC
Start: 2018-06-23 — End: 2018-06-28
  Administered 2018-06-23 – 2018-06-28 (×10): 20 mg via INTRAVENOUS
  Filled 2018-06-23 (×11): qty 50

## 2018-06-23 MED ORDER — LEVETIRACETAM 500 MG PO TABS
1000.0000 mg | ORAL_TABLET | Freq: Two times a day (BID) | ORAL | Status: DC
  Administered 2018-06-23 – 2018-06-25 (×5): 1000 mg via ORAL
  Filled 2018-06-23 (×5): qty 2

## 2018-06-23 MED ORDER — GI COCKTAIL ~~LOC~~
30.0000 mL | Freq: Three times a day (TID) | ORAL | Status: DC | PRN
  Filled 2018-06-23: qty 30

## 2018-06-23 MED ORDER — LORAZEPAM 0.5 MG PO TABS
0.5000 mg | ORAL_TABLET | Freq: Two times a day (BID) | ORAL | Status: DC
  Administered 2018-06-23 (×2): 1 mg via ORAL
  Filled 2018-06-23 (×2): qty 2

## 2018-06-23 MED ORDER — LISINOPRIL 2.5 MG PO TABS
5.0000 mg | ORAL_TABLET | Freq: Every day | ORAL | Status: DC
  Administered 2018-06-24 – 2018-06-25 (×2): 5 mg via ORAL
  Filled 2018-06-23 (×2): qty 2

## 2018-06-23 MED ORDER — DILTIAZEM LOAD VIA INFUSION
10.0000 mg | Freq: Once | INTRAVENOUS | Status: AC
  Administered 2018-06-23: 10 mg via INTRAVENOUS
  Filled 2018-06-23: qty 10

## 2018-06-23 MED ORDER — LACOSAMIDE 100 MG PO TABS
1.0000 | ORAL_TABLET | Freq: Two times a day (BID) | ORAL | Status: DC
  Filled 2018-06-23: qty 1

## 2018-06-23 MED ORDER — ONDANSETRON HCL 4 MG PO TABS: 4.0000 mg | ORAL_TABLET | Freq: Four times a day (QID) | ORAL | Status: DC | PRN

## 2018-06-23 MED ORDER — ATORVASTATIN CALCIUM 10 MG PO TABS
20.0000 mg | ORAL_TABLET | Freq: Every day | ORAL | Status: DC
  Administered 2018-06-23 – 2018-06-24 (×2): 20 mg via ORAL
  Filled 2018-06-23: qty 1
  Filled 2018-06-23 (×2): qty 2

## 2018-06-23 MED ORDER — SODIUM CHLORIDE 0.9 % IV SOLN
INTRAVENOUS | Status: DC
  Administered 2018-06-23 (×2): 75 mL/h via INTRAVENOUS
  Administered 2018-06-24: via INTRAVENOUS

## 2018-06-23 MED ORDER — BUSPIRONE HCL 15 MG PO TABS
15.0000 mg | ORAL_TABLET | Freq: Every day | ORAL | Status: DC
  Administered 2018-06-23 – 2018-06-28 (×5): 15 mg via ORAL
  Filled 2018-06-23 (×4): qty 1
  Filled 2018-06-23: qty 2
  Filled 2018-06-23 (×2): qty 1

## 2018-06-23 MED ORDER — LACOSAMIDE 50 MG PO TABS
100.0000 mg | ORAL_TABLET | Freq: Two times a day (BID) | ORAL | Status: DC
  Administered 2018-06-23 – 2018-06-25 (×5): 100 mg via ORAL
  Filled 2018-06-23 (×5): qty 2

## 2018-06-23 MED ORDER — BUSPIRONE HCL 15 MG PO TABS
30.0000 mg | ORAL_TABLET | Freq: Every day | ORAL | Status: DC
  Administered 2018-06-23 – 2018-06-28 (×4): 30 mg via ORAL
  Filled 2018-06-23: qty 3
  Filled 2018-06-23 (×3): qty 2
  Filled 2018-06-23: qty 3
  Filled 2018-06-23: qty 2

## 2018-06-23 MED ORDER — LURASIDONE HCL 40 MG PO TABS
120.0000 mg | ORAL_TABLET | Freq: Every day | ORAL | Status: DC
  Administered 2018-06-23 – 2018-06-27 (×4): 120 mg via ORAL
  Filled 2018-06-23: qty 3
  Filled 2018-06-23: qty 2
  Filled 2018-06-23 (×3): qty 3

## 2018-06-23 MED ORDER — POTASSIUM CHLORIDE 10 MEQ/100ML IV SOLN
10.0000 meq | INTRAVENOUS | Status: AC
  Administered 2018-06-23 (×4): 10 meq via INTRAVENOUS
  Filled 2018-06-23 (×4): qty 100

## 2018-06-23 MED ORDER — MAGNESIUM SULFATE IN D5W 1-5 GM/100ML-% IV SOLN
1.0000 g | Freq: Once | INTRAVENOUS | Status: AC
  Administered 2018-06-23: 1 g via INTRAVENOUS
  Filled 2018-06-23: qty 100

## 2018-06-23 MED ORDER — LACOSAMIDE 50 MG PO TABS: 100.0000 mg | ORAL_TABLET | Freq: Two times a day (BID) | ORAL | Status: DC

## 2018-06-23 NOTE — Progress Notes (Signed)
  Echocardiogram 2D Echocardiogram has been performed.  Merrie Roof F 06/23/2018, 2:24 PM

## 2018-06-23 NOTE — H&P (Addendum)
History and Physical    ARABELLE BOLLIG KGU:[id number] DOB: 06-17-1956 DOA: 06/23/2018   PCP: Denita Lung, MD   Patient coming from:  Home    Chief Complaint: Nausea, poor oral intake, palpitations  HPI: Brandi [name] is a 62 y.o. female with medical history significant for HTN, HLD, major depression, history of seizures, history of left breast cancer status post lumpectomy in 2009, not on treatment, GERD, recently seen at the emergency department with generalized weakness, and nausea but with negative work-up, which had included at the time a CT of the abdomen and pelvis, presenting with worsening times today, endorsing anorexia, intractable nausea, dark emesis, and shortness of breath especially over the last 2 days.  She denies any fever or chills, or sick contacts.  Patient had a remote endoscopy by Dr. Collene Mares, which per her report was negative, and she was to be seen by GI in the near future, Dr. Redmond School was planning a follow-up as an outpatient, especially since LFTs have been noted to be elevated for the last 2 weeks.  She denies colon or other GI cancers.  She denies any history of ulcers.  No recent long distance trips.  She denies any food poisoning.   She denies any abdominal pain or cramping or diarrhea.  She denies any focal weakness.  She denies any chest pain, chest wall pain or palpitations.  She never had a cardiac evaluation.  She denies any history of MI or cardiac catheterization.  She denies any lower extremity swelling or calf pain.  She takes an aspirin a day and other NSAIDs.  No changes in her medications, but has been taking electrolytes as outpatient.  She denies any headaches, vision changes, no confusion is reported.  No recent seizures. She has been somewhat dizzy due to dehydration according to her, but she denies any vertigo.  She denies a history of a stroke.  She denies any tobacco, alcohol or recreational drug use.  She has not been compliant with her meds, last  taken 1 week ago, due to current complaints   ED Course:  BP 123/81   Pulse 87   Temp 98.8 F (37.1 C) (Oral)   Resp 17   Ht 5' (1.524 m)   Wt 72.1 kg (159 lb)   SpO2 96%   BMI 31.05 kg/m   CT of the abdomen and pelvis showed no acute findings, there are bilateral nonobstructing renal stones, colonic diverticulosis without diverticulitis, compared to the CT of the abdomen and pelvis on 06/16/2018 is essentially unchanged CT head pending  EKG showed A. fib with RVR, periventricular PVCs, right bundle branch block  Troponin 0.02 Tylenol negative Salicylate level less than 7 Urine negative UDS negative Chest x-ray cardio megaly with small effusions Received KCl 40 mEq Received magnesium sulfate 1 g Was placed on Cardizem drip at the ER She was given IV fluids, and Zofran IV   Review of Systems:  As per HPI otherwise all other systems reviewed and are negative  Past Medical History:  Diagnosis Date  . Allergic rhinitis   . Anemia   . Arthritis HX RIGHT ANKLE FX  . Cancer [facility])    breast, lumpectomy  . Depression    MAJOR  . Dyslipidemia   . GERD (gastroesophageal reflux disease)   . History of breast cancer DX 2010--  S/P LUMPECTOMY AND RADIATION -- NO RECURRENCE   ONCOLOGIST- DR Truddie Coco  . Hypertension   . Left flank pain   . Left  ureteral calculus   . Migraines   . Schizophrenic disorder ([place])   . 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 Type II or unspecified type diabetes mellitus without mention of complication, not stated as uncontrolled 03/16/2014    Past Surgical History:  Procedure Laterality Date  . BREAST SURGERY  01-04-2009  DR Margot Chimes   LEFT BREAST LUMPECTOMY W/ SLN DISSECTION  FOR CANCER  . CATARACT EXTRACTION, BILATERAL Bilateral   . CESAREAN SECTION  1998  . CHILD BIRTH     C-SECTION  . CYSTOSCOPY WITH RETROGRADE PYELOGRAM, URETEROSCOPY AND STENT PLACEMENT  11/10/2012   Procedure: CYSTOSCOPY WITH RETROGRADE PYELOGRAM,  URETEROSCOPY AND STENT PLACEMENT;  Surgeon: Hanley Ben, MD;  Location: [place];  Service: Urology;  Laterality: Left;  WITH DOUBLE J STENT  . HOLMIUM LASER APPLICATION  [id number]   Procedure: HOLMIUM LASER APPLICATION;  Surgeon: Hanley Ben, MD;  Location: [place];  Service: Urology;  Laterality: Left;  . LAPAROSCOPIC CHOLECYSTECTOMY  1990'S    Social History Social History   Socioeconomic History  . Marital status: Married    Spouse name: Not on file  . Number of children: Not on file  . Years of education: Not on file  . Highest education level: Not on file  Occupational History  . Not on file  Social Needs  . Financial resource strain: Not on file  . Food insecurity:    Worry: Not on file    Inability: Not on file  . Transportation needs:    Medical: Not on file    Non-medical: Not on file  Tobacco Use  . Smoking status: Never Smoker  . Smokeless tobacco: Never Used  Substance and Sexual Activity  . Alcohol use: No  . Drug use: No  . Sexual activity: Not Currently  Lifestyle  . Physical activity:    Days per week: Not on file    Minutes per session: Not on file  . Stress: Not on file  Relationships  . Social connections:    Talks on phone: Not on file    Gets together: Not on file    Attends religious service: Not on file    Active member of club or organization: Not on file    Attends meetings of clubs or organizations: Not on file    Relationship status: Not on file  . Intimate partner violence:    Fear of current or ex partner: Not on file    Emotionally abused: Not on file    Physically abused: Not on file    Forced sexual activity: Not on file  Other Topics Concern  . Not on file  Social History Narrative  . Not on file     Allergies  Allergen Reactions  . Penicillins Rash    Has patient had a PCN reaction causing immediate rash, facial/tongue/throat swelling, SOB or lightheadedness with hypotension:  No Has patient had a PCN reaction causing severe rash involving mucus membranes or skin necrosis: No Has patient had a PCN reaction that required hospitalization No Has patient had a PCN reaction occurring within the last 10 years: No If all of the above answers are "NO", then may proceed with Cephalosporin use.     Family History  Problem Relation Age of Onset  . Cancer Sister        breast  . Cancer Mother        breast  . Cancer Father  lung  . Mental illness Maternal Uncle   . Cancer Maternal Grandmother   . Stroke Maternal Grandmother   . Tuberculosis Maternal Grandfather   . Cancer Paternal Grandmother   . Seizures Neg Hx        Prior to Admission medications   Medication Sig Start Date End Date Taking? Authorizing Provider  aspirin EC 81 MG EC tablet Take 1 tablet (81 mg total) by mouth daily. 12/09/16  Yes Mikhail, Velta Addison, DO  atorvastatin (LIPITOR) 20 MG tablet Take 1 tablet (20 mg total) by mouth daily. 01/26/18  Yes Denita Lung, MD  busPIRone (BUSPAR) 30 MG tablet Take 15-30 mg by mouth See admin instructions. 15 mg every morning, 30 mg every evening 11/01/14  Yes [provider]  calcium-vitamin D (OSCAL WITH D) 250-125 MG-UNIT per tablet Take 2 tablets by mouth daily. 05/18/14  Yes Causey, Charlestine Massed, NP  dicyclomine (BENTYL) 20 MG tablet Take 1 tablet (20 mg total) by mouth every 8 (eight) hours as needed for spasms. 06/20/18  Yes Long, Wonda Olds, MD  LATUDA 120 MG TABS Take 120 mg by mouth daily.  10/17/15  Yes [provider]  levETIRAcetam (KEPPRA) 500 MG tablet TAKE 2 TABLETS (1,000 MG TOTAL) BY MOUTH 2 (TWO) TIMES DAILY. 08/13/17  Yes [place]ikan, Jinny Blossom, NP  lisinopril (PRINIVIL,ZESTRIL) 5 MG tablet TAKE 1 TABLET (5 MG TOTAL) BY MOUTH DAILY. 01/26/18  Yes Denita Lung, MD  LORazepam (ATIVAN) 0.5 MG tablet Take 0.5-1 mg by mouth 2 (two) times daily. 1 tablet every morning, and 2 tablets at bedtime as needed for anxiety   Yes [provider]  omeprazole (PRILOSEC) 20 MG capsule Take 1 capsule (20 mg total) by mouth daily. 06/22/18  Yes Denita Lung, MD  ondansetron (ZOFRAN ODT) 4 MG disintegrating tablet Take 1 tablet (4 mg total) by mouth every 8 (eight) hours as needed for nausea or vomiting. 06/20/18  Yes Long, Wonda Olds, MD  ondansetron (ZOFRAN) 4 MG tablet Take 1 tablet (4 mg total) by mouth every 8 (eight) hours as needed for nausea or vomiting. 06/22/18  Yes Denita Lung, MD  venlafaxine XR (EFFEXOR-XR) 150 MG 24 hr capsule Take 300 mg by mouth daily.  10/16/15  Yes [provider]  VIMPAT 100 MG TABS TAKE 1 TABLET BY MOUTH TWICE A DAY 01/07/18  Yes Kathrynn Ducking, MD  [facility] DELICA LANCETS FINE MISC DX: E11.9  Patient is to test one time a day 03/11/17   Denita Lung, MD     Physical Exam:  Vitals:   06/23/18 0645 06/23/18 0700 06/23/18 0715 06/23/18 0730  BP: 115/74 114/80 105/73 123/81  Pulse: 88 89 86 87  Resp: 16 18 15 17   Temp:      TempSrc:      SpO2: 93% 98% 98% 96%  Weight:      Height:       Constitutional: NAD, calm, ill appearing  Eyes: PERRL, lids and conjunctivae normal, sclera anicteric  ENMT: Mucous membranes aredry, without exudate or lesions, several missing teeth Neck: normal, supple, no masses, no thyromegaly Respiratory:  Essentially clear to auscultation bilaterally, no wheezing, no crackles. Normal respiratory effort  Cardiovascular:Irregularly irregular rate and rhythm, 2/6 systolic harsh  murmur, rubs or gallops. No extremity edema. 2+ pedal pulses. No carotid bruits.  Abdomen: Soft, non tender,obese  No hepatosplenomegaly. Bowel sounds positive.  Musculoskeletal: no clubbing / cyanosis. Moves all extremities Skin: no jaundice, No lesions.  Neurologic: Sensation  intact  Strength equal in all extremities Psychiatric:   Alert and oriented x 3. Normal mood.     Labs on Admission: I have personally reviewed following labs and imaging studies  CBC: Recent Labs   Lab 06/20/18 1159 06/22/18 1350 06/23/18 0119  WBC 8.7 15.1* 12.1*  NEUTROABS 6.5 11.5*  --   HGB 10.5* 9.9* 10.1*  HCT 36.5 32.8* 35.1*  MCV 66.5* 64* 66.7*  PLT 556* 555* 600*    Basic Metabolic Panel: Recent Labs  Lab 06/20/18 1159 06/22/18 1350 06/23/18 0119 06/23/18 0432  NA 138 138 137  --   K 3.5 4.4 3.2*  --   CL 100 99 98  --   CO2 26 30* 27  --   GLUCOSE 159* 139* 134*  --   BUN 16 19 18   --   CREATININE 0.83 0.89 0.87  --   CALCIUM 9.0 9.7 9.0  --   MG  --   --   --  1.8    GFR: Estimated Creatinine Clearance: 59.4 mL/min (by C-G formula based on SCr of 0.87 mg/dL).  Liver Function Tests: Recent Labs  Lab 06/20/18 1159 06/22/18 1350 06/23/18 0119  AST 51* 35 40  ALT 163* 115* 101*  ALKPHOS 267* 307* 249*  BILITOT 1.3* 1.1 1.1  PROT 6.0* 5.9* 6.4*  ALBUMIN 3.3* 3.8 3.5   Recent Labs  Lab 06/20/18 1159 06/23/18 0119  LIPASE 35 37   No results for input(s): AMMONIA in the last 168 hours.  Coagulation Profile: Recent Labs  Lab 06/20/18 1159 06/23/18 0432  INR 1.28 1.34    Cardiac Enzymes: No results for input(s): CKTOTAL, CKMB, CKMBINDEX, TROPONINI in the last 168 hours.  BNP (last 3 results) No results for input(s): PROBNP in the last 8760 hours.  HbA1C: No results for input(s): HGBA1C in the last 72 hours.  CBG: Recent Labs  Lab 06/22/18 2118  GLUCAP 105*    Lipid Profile: No results for input(s): CHOL, HDL, LDLCALC, TRIG, CHOLHDL, LDLDIRECT in the last 72 hours.  Thyroid Function Tests: No results for input(s): TSH, T4TOTAL, FREET4, T3FREE, THYROIDAB in the last 72 hours.  Anemia Panel: No results for input(s): VITAMINB12, FOLATE, FERRITIN, TIBC, IRON, RETICCTPCT in the last 72 hours.  Urine analysis:    Component Value Date/Time   COLORURINE YELLOW 06/23/2018 [place] 06/23/2018 0434   APPEARANCEUR Cloudy 06/20/2013 1745   LABSPEC 1.014 06/23/2018 0434   LABSPEC 1.012 06/20/2013 1745   PHURINE  6.0 06/23/2018 0434   GLUCOSEU NEGATIVE 06/23/2018 0434   GLUCOSEU Negative 06/20/2013 1745   HGBUR NEGATIVE 06/23/2018 0434   BILIRUBINUR NEGATIVE 06/23/2018 0434   BILIRUBINUR n 05/17/2015 1425   BILIRUBINUR Negative 06/20/2013 1745   KETONESUR NEGATIVE 06/23/2018 0434   PROTEINUR NEGATIVE 06/23/2018 0434   UROBILINOGEN negative 05/17/2015 1425   UROBILINOGEN 0.2 06/29/2013 1530   NITRITE NEGATIVE 06/23/2018 0434   LEUKOCYTESUR NEGATIVE 06/23/2018 0434   LEUKOCYTESUR 3+ 06/20/2013 1745    Sepsis Labs: @LABRCNTIP (procalcitonin:4,lacticidven:4) )No results found for this or any previous visit (from the past 240 hour(s)).   Radiological Exams on Admission: Dg Chest Portable 1 View  Result Date: 06/23/2018 CLINICAL DATA:  Heart palpitations. EXAM: PORTABLE CHEST 1 VIEW COMPARISON:  Frontal and lateral views 06/20/2018 FINDINGS: Unchanged mild cardiomegaly. Mediastinal contours are unchanged. Small pleural effusions better characterized on lung bases from abdominal CT earlier this day. No pulmonary edema or focal consolidation. No pneumothorax. Surgical clips in the left axilla. IMPRESSION:  Cardiomegaly with small pleural effusions. Electronically Signed   By: Jeb Levering M.D.   On: 06/23/2018 05:06   Ct Renal Stone Study  Result Date: 06/23/2018 CLINICAL DATA:  Abdominal pain and emesis. EXAM: CT ABDOMEN AND PELVIS WITHOUT CONTRAST TECHNIQUE: Multidetector CT imaging of the abdomen and pelvis was performed following the standard protocol without IV contrast. COMPARISON:  CT 1 week prior 06/16/2018 FINDINGS: Lower chest: Small pleural effusions have decreased in the interim. Heart remains enlarged. Small hiatal hernia. Hepatobiliary: Decreased hepatic density suggesting steatosis. Possible capsular nodularity again seen. No discrete focal lesion. Clips in the gallbladder fossa postcholecystectomy. No biliary dilatation. Pancreas: No ductal dilatation or inflammation. Spleen: Normal in size  without focal abnormality. Adrenals/Urinary Tract: Normal adrenal glands. No hydronephrosis. Bilateral nonobstructing renal calculi, unchanged from prior. Mild bilateral perinephric edema is similar to prior exam, slightly more prominent on the left. Urinary bladder is physiologically distended. No stone or bladder wall thickening. Stomach/Bowel: Small hiatal hernia. Few prominent fluid-filled left upper quadrant bowel loops without obstruction or inflammatory change. Mild residual enteric contrast in the colon from prior CT. Colonic diverticulosis without diverticulitis. Appendix not confidently visualized. Vascular/Lymphatic: Minimal aortic atherosclerosis. No aneurysm. No enlarged abdominal or pelvic lymph nodes. Reproductive: Uterus and bilateral adnexa are unremarkable. Other: No free air, free fluid, or intra-abdominal fluid collection. Small fat containing umbilical hernia. Musculoskeletal: Degenerative change in the spine. Bone island in the left sacrum. There are no acute or suspicious osseous abnormalities. IMPRESSION: 1. No acute findings in the abdomen/pelvis. 2. Bilateral nonobstructing renal stones. Colonic diverticulosis without diverticulitis. Electronically Signed   By: Jeb Levering M.D.   On: 06/23/2018 04:45    EKG: Independently reviewed.  Assessment/Plan Principal Problem:   Atrial fibrillation with RVR (HCC) Active Problems:   Nausea and vomiting   Major depression   Migraine headache   GERD (gastroesophageal reflux disease)   History of breast cancer in female   Hyperlipidemia associated with type 2 diabetes mellitus (HCC)   Leukocytosis   Type 2 diabetes mellitus without complication, without long-term current use of insulin (HCC)   Hypertension associated with diabetes ([place])   Anemia   Elevated LFTs   Malaise   Schizophrenia ([place])   History of seizures   Nausea and vomiting   Afebrile, nontoxic appearing. Exam benign. Mild leukocytosis at 12.1, but was 15  yesterday. No abdominal pain. CT abdomen unremarkable except for known diverticulosis and known kidney stones.  UA negative.  Of note, the patient has reported dark emesis, and was to be seen by GI as outpatient.  Tylenol negative, salicylate level less than 7.  Lipase normal.  She received IV fluids, IV Zofran, and her electrolytes were replenished.  No history of hepatitis. Admit to inpatient stepdown Clear liquid diet as tolerated  IV Zofran GI cocktail IV Protonix daily  Gentle IV fluids N.p.o. for now, advance diet as tolerated Patient may need GI evaluation in the hospital if vomiting continues or symptoms worsen, otherwise may  f/u as outpatient  CT of the head is pending we will follow results CMET in am      Abnormal LFTs. CT abdomen and pelvis without other acute abnormalities. No history of  hepatitis per chart .  Likely worsened during vomiting.  Tylenol negative. Salicylate level less than 7, AST 40, ALT 101 (was 163 on 06/20/2018), alkaline phosphatase 249 (was 307 on 06/22/2018), bilirubin 1.1 (was 1.3 on 06/20/2018), albumin 3.5. follow up with GI as above Hepatitis panel Repeat LFTs in  a.m. IVF  Paroxysmal Atrial Fibrillation, new onset: CHA2DS2-VASc Score 4. INR on admission 0.34 etiology is thought to be related to HTN,  CHF, versus other unknown causes, work-up is in progress. EKG showed A. fib with RVR, periventricular PVCs, right bundle branch block. Troponin 0.02 Chest x-ray cardiomegaly with small effusionsSince the afib onset is unknown, will focus on rate control and searching for the underlying cause at this time.  She has been placed on diltiazem drip, responding well. Admit to SDU  Diltiazem drip as per protocol with plan to transition to PO Diltiazem once heart rate is controlled.   ASA and other anticoagulants currently on hold, till Hemoccult results, if neg, will proceed with anticoag per Pharmacy  2 D echo  Lipid panel   TSH/free T4 Repeat EKG q 8 x 3  Consult  cardiology    Leukocytosis, likely reactive, WBC 12, was 15 day prior  CXR without infiltrates, small pleural effusion, afebrile     Gentle IVF   Repeat CBC in AM  Hypokalemia, may be due to volume loss  K3.2, Received KCl 40 mEq, Received magnesium sulfate 1 g  Oral replenishment prn  Repeat CMET in am   Hypertension BP 123/81   Pulse 87  Controlled Hold ACE I in view of BP on the lower side due to volume loss.  Patient is on Cardizem. May resume ACE inhibitor in a.m.  Hyperlipidemia Continue home Lipitor  Lipid panel  Type II Diabetes Current blood sugar level is 134 Lab Results  Component Value Date   HGBA1C 6.6 (A) 05/26/2018   SSI   GERD, will place patient on Pepcid 20 mg IV twice daily PPI   History of Seizures Continue present therapy with Keppra, Vimpat  Anxiety and Depression Schizophrenia Continue Ativan,  Buspar and Effexor, Latuda    DVT prophylaxis: SCDs for now, will likely switch to formal anticoagulation in view of atrial fibrillation Code Status:    Full code Family Communication:  Discussed with patient has been Disposition Plan: Expect patient to be discharged to home after condition improves Consults called:    Patient will require  cardiology evaluation. Admission status: IP SDU     Sharene Butters, PA-C [facility]   Amion text  [phone]   06/23/2018, 7:47 AM

## 2018-06-23 NOTE — Consult Note (Addendum)
Cardiology Consultation:   Patient ID: Brandi [name]; [id number]; August 23, 1956   Admit date: 06/23/2018 Date of Consult: 06/23/2018  Primary Care Provider: Denita Lung, MD Primary Cardiologist: New to Brandi [name]    Patient Profile:   Brandi [name] is a 62 y.o. female with a hx of breast cancer treated in 2010 w/ lumpectomy (no radiation), DLD, HTN, h/o seizures, GERD, depression and no prior cardiac history, who is being seen today for the evaluation of new atrial fibrillation, in the setting of acute GI illness, at the request of Brandi. Hal [name], Internal Medicine.  History of Present Illness:   [name] is a 62 y.o. female with a hx of breast cancer treated in 2010 w/ lumpectomy (no radiation), DLD, HTN, h/o seizures, GERD, depression and no prior cardiac history, who is being seen today for the evaluation of new atrial fibrillation,  at the request of Brandi. Hal [name], Internal Medicine.  She presented to the ED overnight with complaints of nausea, vomitting, poor PO intake and SOB. No chest pain. On arrival, she was noted on be in Afib w/ RVR of unknown duration and started on an IV Caredizem drip. Initial rates in the 160s. Labs were notable for elevated WBC ct at 12, hypokalemia w/ K at 3.2, low normal Mg at 1.8. SCR/BUN normal at 0.89/19. Elevated BNP at 689. CXR with cardiomegaly and small pleural effusions. CMP also notable for elevated LFTs. This was recently discovered at previous ER visit and plans were made for pt to f/u with GI as an outpatient. Today, ALT was 101 (down from 611!) and Alk Phos 249 (down from 380). Acetaminophen and salicylate levels negative.  Hepatitis panel pending. Recent CT of abdomen and pelvis showed possible cirrhosis and bilateral renal calculi, measuring up to 4 mm. RUQ ultrasound also showed possible cirrhosis. Thyroid function test pending. CBC also abnormal with low Hgb at 10.1. MCV suggest microcytic anemia at 66.7 fL. Also thrombocytosis w/ platelet  ct at 600. Initial troponin is negative. Echocardiogram pending.  Pt has been admitted by IM. She has received electrolyte supplementation and started on IVFs. Zofran given for nausea as well as given IV H2 blocker and GI cocktail. She is on a clear liquid diet with plans to advance as tolerated.  She remains on IV Cardizem drip and HR now controlled in the 80s. She has converted to NSR.    Past Medical History:  Diagnosis Date  . Allergic rhinitis   . Anemia   . Arthritis HX RIGHT ANKLE FX  . Cancer [facility])    breast, lumpectomy  . Depression    MAJOR  . Dyslipidemia   . GERD (gastroesophageal reflux disease)   . History of breast cancer DX 2010--  S/P LUMPECTOMY AND RADIATION -- NO RECURRENCE   ONCOLOGIST- Brandi [name]  . Hypertension   . Left flank pain   . Left ureteral calculus   . Migraines   . Schizophrenic disorder ([place])   . Tonic-clonic seizure disorder ([place]) X1  2010--  NO SEIZURE SINCE  . Type II or unspecified type diabetes mellitus without mention of complication, not stated as uncontrolled 03/16/2014    Past Surgical History:  Procedure Laterality Date  . BREAST SURGERY  01-04-2009  Brandi Brandi [name]   LEFT BREAST LUMPECTOMY W/ SLN DISSECTION  FOR CANCER  . CATARACT EXTRACTION, BILATERAL Bilateral   . CESAREAN SECTION  1998  . CHILD BIRTH     C-SECTION  . CYSTOSCOPY WITH RETROGRADE PYELOGRAM, URETEROSCOPY AND STENT PLACEMENT  11/10/2012   Procedure: CYSTOSCOPY WITH RETROGRADE PYELOGRAM, URETEROSCOPY AND STENT PLACEMENT;  Surgeon: Brandi Ben, MD;  Location: [place];  Service: Urology;  Laterality: Left;  WITH DOUBLE J STENT  . HOLMIUM LASER APPLICATION  51/[id number]/0174   Procedure: HOLMIUM LASER APPLICATION;  Surgeon: Brandi Ben, MD;  Location: [place];  Service: Urology;  Laterality: Left;  . LAPAROSCOPIC CHOLECYSTECTOMY  1990'S     Home Medications:  Prior to Admission medications   Medication Sig Start Date End Date Taking?  Authorizing Provider  aspirin EC 81 MG EC tablet Take 1 tablet (81 mg total) by mouth daily. 12/09/16  Yes Brandi [name], Brandi Addison, DO  atorvastatin (LIPITOR) 20 MG tablet Take 1 tablet (20 mg total) by mouth daily. 01/26/18  Yes Brandi Lung, MD  busPIRone (BUSPAR) 30 MG tablet Take 15-30 mg by mouth See admin instructions. 15 mg every morning, 30 mg every evening 11/01/14  Yes [provider]  calcium-vitamin D (OSCAL WITH D) 250-125 MG-UNIT per tablet Take 2 tablets by mouth daily. 05/18/14  Yes Causey, Brandi Massed, NP  dicyclomine (BENTYL) 20 MG tablet Take 1 tablet (20 mg total) by mouth every 8 (eight) hours as needed for spasms. 06/20/18  Yes [name], Brandi Olds, MD  LATUDA 120 MG TABS Take 120 mg by mouth daily.  10/17/15  Yes [provider]  levETIRAcetam (KEPPRA) 500 MG tablet TAKE 2 TABLETS (1,000 MG TOTAL) BY MOUTH 2 (TWO) TIMES DAILY. 08/13/17  Yes [name], Brandi Blossom, NP  lisinopril (PRINIVIL,ZESTRIL) 5 MG tablet TAKE 1 TABLET (5 MG TOTAL) BY MOUTH DAILY. 01/26/18  Yes Brandi Lung, MD  LORazepam (ATIVAN) 0.5 MG tablet Take 0.5-1 mg by mouth 2 (two) times daily. 1 tablet every morning, and 2 tablets at bedtime as needed for anxiety   Yes [provider]  omeprazole (PRILOSEC) 20 MG capsule Take 1 capsule (20 mg total) by mouth daily. 06/22/18  Yes Brandi Lung, MD  ondansetron (ZOFRAN ODT) 4 MG disintegrating tablet Take 1 tablet (4 mg total) by mouth every 8 (eight) hours as needed for nausea or vomiting. 06/20/18  Yes [name], Brandi Olds, MD  ondansetron (ZOFRAN) 4 MG tablet Take 1 tablet (4 mg total) by mouth every 8 (eight) hours as needed for nausea or vomiting. 06/22/18  Yes Brandi Lung, MD  venlafaxine XR (EFFEXOR-XR) 150 MG 24 hr capsule Take 300 mg by mouth daily.  10/16/15  Yes [provider]  VIMPAT 100 MG TABS TAKE 1 TABLET BY MOUTH TWICE A DAY 01/07/18  Yes Brandi Ducking, MD  [facility] DELICA LANCETS FINE MISC DX: E11.9  Patient is to test one time a  day 03/11/17   Brandi Lung, MD    Inpatient Medications: Scheduled Meds: . [START ON 06/24/2018] aspirin  81 mg Oral Daily  . atorvastatin  20 mg Oral Daily  . busPIRone  15 mg Oral Daily  . busPIRone  30 mg Oral QHS  . insulin aspart  0-9 Units Subcutaneous TID WC  . Lacosamide  1 tablet Oral BID  . levETIRAcetam  1,000 mg Oral BID  . [START ON 06/24/2018] lisinopril  5 mg Oral Daily  . LORazepam  0.5-1 mg Oral BID  . lurasidone  120 mg Oral Daily  . venlafaxine XR  300 mg Oral Daily   Continuous Infusions: . sodium chloride 75 mL/hr (06/23/18 0937)  . diltiazem (CARDIZEM) infusion 6 mg/hr (06/23/18 1007)  . famotidine (PEPCID) IV  PRN Meds: bisacodyl, gi cocktail, HYDROcodone-acetaminophen, ondansetron **OR** ondansetron (ZOFRAN) IV, senna-docusate  Allergies:    Allergies  Allergen Reactions  . Penicillins Rash    Has patient had a PCN reaction causing immediate rash, facial/tongue/throat swelling, SOB or lightheadedness with hypotension: No Has patient had a PCN reaction causing severe rash involving mucus membranes or skin necrosis: No Has patient had a PCN reaction that required hospitalization No Has patient had a PCN reaction occurring within the last 10 years: No If all of the above answers are "NO", then may proceed with Cephalosporin use.     Social History:   Social History   Socioeconomic History  . Marital status: Married    Spouse name: Not on file  . Number of children: Not on file  . Years of education: Not on file  . Highest education level: Not on file  Occupational History  . Not on file  Social Needs  . Financial resource strain: Not on file  . Food insecurity:    Worry: Not on file    Inability: Not on file  . Transportation needs:    Medical: Not on file    Non-medical: Not on file  Tobacco Use  . Smoking status: Never Smoker  . Smokeless tobacco: Never Used  Substance and Sexual Activity  . Alcohol use: No  . Drug use: No  .  Sexual activity: Not Currently  Lifestyle  . Physical activity:    Days per week: Not on file    Minutes per session: Not on file  . Stress: Not on file  Relationships  . Social connections:    Talks on phone: Not on file    Gets together: Not on file    Attends religious service: Not on file    Active member of club or organization: Not on file    Attends meetings of clubs or organizations: Not on file    Relationship status: Not on file  . Intimate partner violence:    Fear of current or ex partner: Not on file    Emotionally abused: Not on file    Physically abused: Not on file    Forced sexual activity: Not on file  Other Topics Concern  . Not on file  Social History Narrative  . Not on file    Family History:    Family History  Problem Relation Age of Onset  . Cancer Sister        breast  . Cancer Mother        breast  . Cancer Father        [name]  . Mental illness Maternal Uncle   . Cancer Maternal Grandmother   . Stroke Maternal Grandmother   . Tuberculosis Maternal Grandfather   . Cancer Paternal Grandmother   . Seizures Neg Hx      ROS:  Please see the history of present illness.   All other ROS reviewed and negative.     Physical Exam/Data:   Vitals:   06/23/18 0922 06/23/18 0930 06/23/18 0945 06/23/18 1000  BP: 117/73   (P) 111/73  Pulse: 84 85 82   Resp:  (!) 27 20   Temp: 99.1 F (37.3 C)     TempSrc: Oral     SpO2: (P) 94% 92% 94%   Weight: 164 lb 7.4 oz (74.6 kg)     Height: 5' (1.524 m)       Intake/Output Summary (Last 24 hours) at 06/23/2018 1012 Last data filed at  06/23/2018 0830 Gross per 24 hour  Intake 800 ml  Output -  Net 800 ml   Filed Weights   06/23/18 0155 06/23/18 0922  Weight: 159 lb (72.1 kg) 164 lb 7.4 oz (74.6 kg)   Body mass index is 32.12 kg/m.  General:  Well nourished, well developed, in no acute distress, twitch  HEENT: normal Lymph: no adenopathy Neck: no JVD Endocrine:  No thryomegaly Vascular: No  carotid bruits; FA pulses 2+ bilaterally without bruits  Cardiac:  normal S1, S2; RRR; no murmur  Lungs:  clear to auscultation bilaterally, no wheezing, rhonchi or rales  Abd: soft, nontender, no hepatomegaly  Ext: no edema Musculoskeletal:  No deformities, BUE and BLE strength normal and equal Skin: warm and dry  Neuro:  CNs 2-12 intact, no focal abnormalities noted Psych:  Normal affect   EKG:  The EKG was personally reviewed and demonstrates:  Afib w/ RVR 161 bpm Telemetry:  Telemetry was personally reviewed and demonstrates:  NSR currently with rates in the 90s   Relevant CV Studies: 2D Echo pending  Laboratory Data:  Chemistry Recent Labs  Lab 06/20/18 1159 06/22/18 1350 06/23/18 0119  NA 138 138 137  K 3.5 4.4 3.2*  CL 100 99 98  CO2 26 30* 27  GLUCOSE 159* 139* 134*  BUN '16 19 18  ' CREATININE 0.83 0.89 0.87  CALCIUM 9.0 9.7 9.0  GFRNONAA >60 70 >60  GFRAA >60 80 >60  ANIONGAP 12  --  12    Recent Labs  Lab 06/20/18 1159 06/22/18 1350 06/23/18 0119  PROT 6.0* 5.9* 6.4*  ALBUMIN 3.3* 3.8 3.5  AST 51* 35 40  ALT 163* 115* 101*  ALKPHOS 267* 307* 249*  BILITOT 1.3* 1.1 1.1   Hematology Recent Labs  Lab 06/20/18 1159 06/22/18 1350 06/23/18 0119  WBC 8.7 15.1* 12.1*  RBC 5.49* 5.13 5.26*  HGB 10.5* 9.9* 10.1*  HCT 36.5 32.8* 35.1*  MCV 66.5* 64* 66.7*  MCH 19.1* 19.3* 19.2*  MCHC 28.8* 30.2* 28.8*  RDW 19.2* 22.9* 19.2*  PLT 556* 555* 600*   Cardiac EnzymesNo results for input(s): TROPONINI in the last 168 hours.  Recent Labs  Lab 06/23/18 0437  TROPIPOC 0.02    BNP Recent Labs  Lab 06/23/18 0119  BNP 689.0*    DDimer No results for input(s): DDIMER in the last 168 hours.  Radiology/Studies:  Dg Chest 2 View  Result Date: 06/20/2018 CLINICAL DATA:  Nausea and vomiting.  Shortness of breath. EXAM: CHEST - 2 VIEW COMPARISON:  December 25, 2016 FINDINGS: The heart size is borderline to mildly enlarged. The hila and mediastinum are normal.  No pneumothorax. No pulmonary nodules, masses, or focal infiltrates. Apparent tiny left effusion. IMPRESSION: Apparent tiny left effusion.  No other abnormalities. Electronically Signed   By: Dorise Bullion III M.D   On: 06/20/2018 12:38   Ct Head Wo Contrast  Result Date: 06/23/2018 CLINICAL DATA:  Weakness, leukocytosis, hypertension, type II diabetes mellitus, seizure disorder, schizophrenia, migraines, LEFT breast cancer post lumpectomy, vomiting EXAM: CT HEAD WITHOUT CONTRAST TECHNIQUE: Contiguous axial images were obtained from the base of the skull through the vertex without intravenous contrast. Sagittal and coronal MPR images reconstructed from axial data set. COMPARISON:  07/13/2009 FINDINGS: Brain: Generalized atrophy. Normal ventricular morphology. No midline shift or mass effect. Small vessel chronic ischemic changes of deep cerebral white matter. No intracranial hemorrhage, mass lesion, evidence of acute infarction, or extra-axial fluid collection. Vascular: No hyperdense vessels Skull: Intact Sinuses/Orbits:  Clear Other: N/A IMPRESSION: Atrophy with small vessel chronic ischemic changes of deep cerebral white matter. No acute intracranial abnormalities. Electronically Signed   By: Lavonia Dana M.D.   On: 06/23/2018 08:37   Dg Chest Portable 1 View  Result Date: 06/23/2018 CLINICAL DATA:  Heart palpitations. EXAM: PORTABLE CHEST 1 VIEW COMPARISON:  Frontal and lateral views 06/20/2018 FINDINGS: Unchanged mild cardiomegaly. Mediastinal contours are unchanged. Small pleural effusions better characterized on [name] bases from abdominal CT earlier this day. No pulmonary edema or focal consolidation. No pneumothorax. Surgical clips in the left axilla. IMPRESSION: Cardiomegaly with small pleural effusions. Electronically Signed   By: Jeb Levering M.D.   On: 06/23/2018 05:06   Ct Renal Stone Study  Result Date: 06/23/2018 CLINICAL DATA:  Abdominal pain and emesis. EXAM: CT ABDOMEN AND PELVIS  WITHOUT CONTRAST TECHNIQUE: Multidetector CT imaging of the abdomen and pelvis was performed following the standard protocol without IV contrast. COMPARISON:  CT 1 week prior 06/16/2018 FINDINGS: Lower chest: Small pleural effusions have decreased in the interim. Heart remains enlarged. Small hiatal hernia. Hepatobiliary: Decreased hepatic density suggesting steatosis. Possible capsular nodularity again seen. No discrete focal lesion. Clips in the gallbladder fossa postcholecystectomy. No biliary dilatation. Pancreas: No ductal dilatation or inflammation. Spleen: Normal in size without focal abnormality. Adrenals/Urinary Tract: Normal adrenal glands. No hydronephrosis. Bilateral nonobstructing renal calculi, unchanged from prior. Mild bilateral perinephric edema is similar to prior exam, slightly more prominent on the left. Urinary bladder is physiologically distended. No stone or bladder wall thickening. Stomach/Bowel: Small hiatal hernia. Few prominent fluid-filled left upper quadrant bowel loops without obstruction or inflammatory change. Mild residual enteric contrast in the colon from prior CT. Colonic diverticulosis without diverticulitis. Appendix not confidently visualized. Vascular/Lymphatic: Minimal aortic atherosclerosis. No aneurysm. No enlarged abdominal or pelvic lymph nodes. Reproductive: Uterus and bilateral adnexa are unremarkable. Other: No free air, free fluid, or intra-abdominal fluid collection. Small fat containing umbilical hernia. Musculoskeletal: Degenerative change in the spine. Bone island in the left sacrum. There are no acute or suspicious osseous abnormalities. IMPRESSION: 1. No acute findings in the abdomen/pelvis. 2. Bilateral nonobstructing renal stones. Colonic diverticulosis without diverticulitis. Electronically Signed   By: Jeb Levering M.D.   On: 06/23/2018 04:45   US Abdomen Limited Ruq  Result Date: 06/20/2018 CLINICAL DATA:  RIGHT upper quadrant pain. EXAM: ULTRASOUND  ABDOMEN LIMITED RIGHT UPPER QUADRANT COMPARISON:  CT abdomen and pelvis 06/16/2018. FINDINGS: Gallbladder: Surgically absent. Common bile duct: Diameter: 4.2 mm, normal. Liver: Increased echogenicity, with nodular contour. Portal vein is patent on color Doppler imaging with normal direction of blood flow towards the liver. IMPRESSION: Possible cirrhosis.  Increased echogenicity with nodular contour. Gallbladder surgically absent, no biliary ductal dilatation. Electronically Signed   By: Staci Righter M.D.   On: 06/20/2018 14:04    Assessment and Plan:   Ms. Rothman is a 62 y.o. female with a hx of breast cancer treated in 2010 w/ lumpectomy (no radiation), DLD, HTN, h/o seizures, GERD, depression and no prior cardiac history, who is being seen today for the evaluation of new atrial fibrillation in the setting of acute GI illness,  at the request of Brandi. Hal [name], Internal Medicine.  1. Atrial Fibrillation: new onset of unknown duration. Initial rates were in the 160s in the ED, but have improved with IV Cardizem, now in the 80s. She has also converted back to NSR. Her afib is in the setting of acute GI illness with nausea/vomitting, decreased PO intake with mild electrolyte deficienies  and abnormal LFTs. Recent CT of abdomen and pelvis as well as RUQ ultrasound suggest possible cirrhosis. Suspect her acute GI illness is the primary cause of her Afib. Further w/u and management of GI illness per IM and GI.  Given her n/v and poor PO intake, we recommend continuation of IV Cardizem for now, until she is better able to tolerate POs. Agree with 2D echo to assess LVEF and valve anatomy. If EF is normal, can transition from IV Cardizem to PO Cardizem once tolerating PO. If EF is reduced, then we would recommend changing to a beta blocker for rate control. She has received electrolyte supplementation and we recommend that K and Mg be monitor closely given her vomiting. We are still awaiting results of thyroid  function test. We will follow this. From what is known thus far from her history, her CHA2DS2 VASc score is at least 2 for HTN and female sex. This patients CHA2DS2-VASc Score and unadjusted Ischemic Stroke Rate (% per year) is equal to 2.2 % stroke rate/year from a score of 2. However given her abnormal liver function and anemia, we will hold off on anticoagulation at this time. We may also consider a 30 day outpatient monitor to screen for recurrence prior do reaching a decision regarding [name] term oral anticoagulation, as it is possible that this may be a one time occurrence due to acute illness. I will discuss this further with MD.   2. N/V and Abnormal FLTs: management per IM and GI.    For questions or updates, please contact [place] Please consult [email or web address] for contact info under Cardiology/STEMI.   Signed, Lyda Jester, PA-C  06/23/2018 10:12 AM   Attending Note:   The patient was seen and examined.  Agree with assessment and plan as noted above.  Changes made to the above note as needed.  Patient seen and independently examined with  Ellen Henri, PA .   We discussed all aspects of the encounter. I agree with the assessment and plan as stated above.  1.   New atrial fib:   I suspect the AF is secondary to her underlying GI illness. She has converted with Cardizem drip Continue Dilt drip for now.   Change to PO once she is taking PO well   Would hold off on starting anticoagulation for now.   Would wait and see if she needs any surgical procedures.  Echo      I have spent a total of 40 minutes with patient reviewing hospital  notes , telemetry, EKGs, labs and examining patient as well as establishing an assessment and plan that was discussed with the patient. > 50% of time was spent in direct patient care.    Thayer Headings, Brooke Bonito., MD, [facility] 06/23/2018, 11:53 AM [address],  [place] Pager [phone]

## 2018-06-23 NOTE — ED Provider Notes (Signed)
[facility] DEPT Provider Note   CSN: [id number] Arrival date & time: 06/22/18  1930     History   Chief Complaint Chief Complaint  Patient presents with  . Nausea  . Weakness    HPI Brandi [name] is a 62 y.o. female.  The history is provided by the patient. No language interpreter was used.  Weakness  Primary symptoms include no focal weakness, no loss of sensation, no loss of balance, no speech change, no memory loss, no movement disorder, no visual change, no auditory change, and no dizziness. Primary symptoms comment: global weakness and nausea and "not feeling well for over a week" Had a CT and ultrasound for same last week and was seen in the ED.  Continues to endorse anorexia and nausea and had palpitations and SOB of the weekend.  . This is a new problem. The current episode started more than 1 week ago. The problem has not changed since onset.There was no focality noted. There has been no fever. Associated symptoms include shortness of breath and vomiting. Pertinent negatives include no chest pain, no altered mental status, no confusion and no headaches. There were no medications administered prior to arrival. Associated medical issues do not include trauma.  No focal weakness.  No HA.  No f/c/r.    Past Medical History:  Diagnosis Date  . Allergic rhinitis   . Anemia   . Arthritis HX RIGHT ANKLE FX  . Cancer [facility])    breast, lumpectomy  . Depression    MAJOR  . Dyslipidemia   . GERD (gastroesophageal reflux disease)   . History of breast cancer DX 2010--  S/P LUMPECTOMY AND RADIATION -- NO RECURRENCE   ONCOLOGIST- DR Truddie Coco  . Hypertension   . Left flank pain   . Left ureteral calculus   . Migraines   . Schizophrenic disorder ([place])   . 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 Type II or unspecified type diabetes mellitus without mention of complication, not stated as uncontrolled 03/16/2014    Patient  Active Problem List   Diagnosis Date Noted  . Atrial fibrillation with RVR ([place]) 06/23/2018  . SOB (shortness of breath) 06/12/2018  . Abnormal EKG 06/12/2018  . Elevated LFTs 06/12/2018  . Malaise 06/12/2018  . Hypertension associated with diabetes ([place]) 12/05/2016  . Anemia 12/05/2016  . Convulsions/seizures ([place]) 11/02/2014  . Type 2 diabetes mellitus without complication, without long-term current use of insulin ([place]) 03/16/2014  . Leukocytosis 06/29/2013  . Thrombocytosis ([place]) 06/29/2013  . Major depression 09/10/2011  . Migraine headache 09/10/2011  . Obesity (BMI 30-39.9) 09/10/2011  . GERD (gastroesophageal reflux disease) 09/10/2011  . Allergic rhinitis due to pollen 09/10/2011  . History of breast cancer in female 09/10/2011  . Hyperlipidemia associated with type 2 diabetes mellitus ([place]) 09/10/2011    Past Surgical History:  Procedure Laterality Date  . BREAST SURGERY  01-04-2009  DR Margot Chimes   LEFT BREAST LUMPECTOMY W/ SLN DISSECTION  FOR CANCER  . CATARACT EXTRACTION, BILATERAL Bilateral   . CESAREAN SECTION  1998  . CHILD BIRTH     C-SECTION  . CYSTOSCOPY WITH RETROGRADE PYELOGRAM, URETEROSCOPY AND STENT PLACEMENT  11/10/2012   Procedure: CYSTOSCOPY WITH RETROGRADE PYELOGRAM, URETEROSCOPY AND STENT PLACEMENT;  Surgeon: Hanley Ben, MD;  Location: [facility];  Service: Urology;  Laterality: Left;  WITH DOUBLE J STENT  . HOLMIUM LASER APPLICATION  [id number]   Procedure: HOLMIUM LASER APPLICATION;  Surgeon: Hanley Ben, MD;  Location: [facility];  Service: Urology;  Laterality: Left;  . LAPAROSCOPIC CHOLECYSTECTOMY  1990'S     OB History   None      Home Medications    Prior to Admission medications   Medication Sig Start Date End Date Taking? Authorizing Provider  aspirin EC 81 MG EC tablet Take 1 tablet (81 mg total) by mouth daily. 12/09/16  Yes Mikhail, Velta Addison, DO  atorvastatin (LIPITOR) 20 MG tablet Take 1 tablet  (20 mg total) by mouth daily. 01/26/18  Yes Denita Lung, MD  busPIRone (BUSPAR) 30 MG tablet Take 15-30 mg by mouth See admin instructions. 15 mg every morning, 30 mg every evening 11/01/14  Yes [provider]  calcium-vitamin D (OSCAL WITH D) 250-125 MG-UNIT per tablet Take 2 tablets by mouth daily. 05/18/14  Yes Causey, Charlestine Massed, NP  dicyclomine (BENTYL) 20 MG tablet Take 1 tablet (20 mg total) by mouth every 8 (eight) hours as needed for spasms. 06/20/18  Yes Long, Wonda Olds, MD  LATUDA 120 MG TABS Take 120 mg by mouth daily.  10/17/15  Yes [provider]  levETIRAcetam (KEPPRA) 500 MG tablet TAKE 2 TABLETS (1,000 MG TOTAL) BY MOUTH 2 (TWO) TIMES DAILY. 08/13/17  Yes [place], Jinny Blossom, NP  lisinopril (PRINIVIL,ZESTRIL) 5 MG tablet TAKE 1 TABLET (5 MG TOTAL) BY MOUTH DAILY. 01/26/18  Yes Denita Lung, MD  LORazepam (ATIVAN) 0.5 MG tablet Take 0.5-1 mg by mouth 2 (two) times daily. 1 tablet every morning, and 2 tablets at bedtime as needed for anxiety   Yes [provider]  omeprazole (PRILOSEC) 20 MG capsule Take 1 capsule (20 mg total) by mouth daily. 06/22/18  Yes Denita Lung, MD  ondansetron (ZOFRAN ODT) 4 MG disintegrating tablet Take 1 tablet (4 mg total) by mouth every 8 (eight) hours as needed for nausea or vomiting. 06/20/18  Yes Long, Wonda Olds, MD  ondansetron (ZOFRAN) 4 MG tablet Take 1 tablet (4 mg total) by mouth every 8 (eight) hours as needed for nausea or vomiting. 06/22/18  Yes Denita Lung, MD  venlafaxine XR (EFFEXOR-XR) 150 MG 24 hr capsule Take 300 mg by mouth daily.  10/16/15  Yes [provider]  VIMPAT 100 MG TABS TAKE 1 TABLET BY MOUTH TWICE A DAY 01/07/18  Yes Kathrynn Ducking, MD  [facility] DELICA LANCETS FINE MISC DX: E11.9  Patient is to test one time a day 03/11/17   Denita Lung, MD    Family History Family History  Problem Relation Age of Onset  . Cancer Sister        breast  . Cancer Mother        breast  . Cancer  Father        lung  . Mental illness Maternal Uncle   . Cancer Maternal Grandmother   . Stroke Maternal Grandmother   . Tuberculosis Maternal Grandfather   . Cancer Paternal Grandmother   . Seizures Neg Hx     Social History Social History   Tobacco Use  . Smoking status: Never Smoker  . Smokeless tobacco: Never Used  Substance Use Topics  . Alcohol use: No  . Drug use: No     Allergies   Penicillins   Review of Systems Review of Systems  Constitutional: Positive for appetite change and fatigue. Negative for diaphoresis and fever.  Respiratory: Positive for shortness of breath.   Cardiovascular: Positive for palpitations. Negative for chest  pain.  Gastrointestinal: Positive for nausea and vomiting. Negative for abdominal pain, constipation and diarrhea.  Neurological: Positive for weakness. Negative for dizziness, speech change, focal weakness, speech difficulty, numbness, headaches and loss of balance.  Psychiatric/Behavioral: Negative for confusion and memory loss.  All other systems reviewed and are negative.    Physical Exam Updated Vital Signs BP 126/73   Pulse (!) 44   Temp 98.8 F (37.1 C) (Oral)   Resp 18   Ht 5' (1.524 m)   Wt 72.1 kg (159 lb)   SpO2 96%   BMI 31.05 kg/m   Physical Exam  Constitutional: She appears well-developed and well-nourished.  HENT:  Head: Normocephalic and atraumatic.  Mouth/Throat: No oropharyngeal exudate.  Eyes: Pupils are equal, round, and reactive to light.  Neck: Normal range of motion. Neck supple. No JVD present.  Cardiovascular: Normal heart sounds and intact distal pulses. An irregularly irregular rhythm present. Tachycardia present.  Pulmonary/Chest: Effort normal and breath sounds normal. No stridor. No respiratory distress. She has no wheezes. She has no rales.  Abdominal: Soft. She exhibits no mass. There is no tenderness. There is no rebound and no guarding.  Musculoskeletal: Normal range of motion. She  exhibits no edema.  Neurological: She is alert. She displays normal reflexes.  Skin: Skin is warm and dry. Capillary refill takes less than 2 seconds. She is not diaphoretic.  Psychiatric: She has a normal mood and affect.     ED Treatments / Results  Labs (all labs ordered are listed, but only abnormal results are displayed) Medications  potassium chloride 10 mEq in 100 mL IVPB (10 mEq Intravenous New Bag/Given 06/23/18 0550)  magnesium sulfate IVPB 1 g 100 mL (1 g Intravenous New Bag/Given 06/23/18 0552)  diltiazem (CARDIZEM) 1 mg/mL load via infusion 10 mg (10 mg Intravenous Bolus from Bag 06/23/18 0549)    And  diltiazem (CARDIZEM) 100 mg in dextrose 5% 119mL (1 mg/mL) infusion (5 mg/hr Intravenous New Bag/Given 06/23/18 0550)  sodium chloride 0.9 % bolus 500 mL (500 mLs Intravenous New Bag/Given 06/23/18 0550)  ondansetron (ZOFRAN) injection 4 mg (4 mg Intravenous Given 06/23/18 0154)    EKG EKG Interpretation  Date/Time:  Tuesday June 23 2018 01:43:31 EDT Ventricular Rate:  161 PR Interval:    QRS Duration: 132 QT Interval:  411 QTC Calculation: 551 R Axis:   -82 Text Interpretation:  Atrial fibrillation with RVR 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 EXAM: PORTABLE CHEST 1 VIEW COMPARISON:  Frontal and lateral views 06/20/2018 FINDINGS: Unchanged mild cardiomegaly. Mediastinal contours are unchanged. Small pleural effusions better characterized on lung bases from abdominal CT earlier this day. No pulmonary edema or focal consolidation. No pneumothorax. Surgical clips in the left axilla. IMPRESSION: Cardiomegaly with small pleural effusions. Electronically Signed   By: Jeb Levering M.D.   On: 06/23/2018 05:06   Ct Renal Stone Study  Result Date: 06/23/2018 CLINICAL DATA:  Abdominal pain and emesis. EXAM: CT  ABDOMEN AND PELVIS WITHOUT CONTRAST TECHNIQUE: Multidetector CT imaging of the abdomen and pelvis was performed following the standard protocol without IV contrast. COMPARISON:  CT 1 week prior 06/16/2018 FINDINGS: Lower chest: Small pleural effusions have decreased in the interim. Heart remains enlarged. Small hiatal hernia. Hepatobiliary: Decreased hepatic density suggesting steatosis. Possible capsular nodularity again seen. No discrete focal lesion. Clips in the gallbladder  fossa postcholecystectomy. No biliary dilatation. Pancreas: No ductal dilatation or inflammation. Spleen: Normal in size without focal abnormality. Adrenals/Urinary Tract: Normal adrenal glands. No hydronephrosis. Bilateral nonobstructing renal calculi, unchanged from prior. Mild bilateral perinephric edema is similar to prior exam, slightly more prominent on the left. Urinary bladder is physiologically distended. No stone or bladder wall thickening. Stomach/Bowel: Small hiatal hernia. Few prominent fluid-filled left upper quadrant bowel loops without obstruction or inflammatory change. Mild residual enteric contrast in the colon from prior CT. Colonic diverticulosis without diverticulitis. Appendix not confidently visualized. Vascular/Lymphatic: Minimal aortic atherosclerosis. No aneurysm. No enlarged abdominal or pelvic lymph nodes. Reproductive: Uterus and bilateral adnexa are unremarkable. Other: No free air, free fluid, or intra-abdominal fluid collection. Small fat containing umbilical hernia. Musculoskeletal: Degenerative change in the spine. Bone island in the left sacrum. There are no acute or suspicious osseous abnormalities. IMPRESSION: 1. No acute findings in the abdomen/pelvis. 2. Bilateral nonobstructing renal stones. Colonic diverticulosis without diverticulitis. Electronically Signed   By: Jeb Levering M.D.   On: 06/23/2018 04:45    Procedures Procedures (including critical care time)  Medications Ordered in  ED Medications  potassium chloride 10 mEq in 100 mL IVPB (10 mEq Intravenous New Bag/Given 06/23/18 0550)  magnesium sulfate IVPB 1 g 100 mL (1 g Intravenous New Bag/Given 06/23/18 0552)  diltiazem (CARDIZEM) 1 mg/mL load via infusion 10 mg (10 mg Intravenous Bolus from Bag 06/23/18 0549)    And  diltiazem (CARDIZEM) 100 mg in dextrose 5% 164mL (1 mg/mL) infusion (5 mg/hr Intravenous New Bag/Given 06/23/18 0550)  sodium chloride 0.9 % bolus 500 mL (500 mLs Intravenous New Bag/Given 06/23/18 0550)  ondansetron (ZOFRAN) injection 4 mg (4 mg Intravenous Given 06/23/18 0154)   MDM Reviewed: previous chart, nursing note and vitals Reviewed previous: labs Interpretation: labs, ECG, x-ray and CT scan (hypokalemia, borderline magnesium) Total time providing critical care: 75-105 minutes. This excludes time spent performing separately reportable procedures and services. Consults: admitting MD    CRITICAL CARE Performed by: Carlisle Beers Total critical care time: 75  minutes Critical care time was exclusive of separately billable procedures and treating other patients. Critical care was necessary to treat or prevent imminent or life-threatening deterioration. Critical care was time spent personally by me on the following activities: development of treatment plan with patient and/or surrogate as well as nursing, discussions with consultants, evaluation of patient's response to treatment, examination of patient, obtaining history from patient or surrogate, ordering and performing treatments and interventions, ordering and review of laboratory studies, ordering and review of radiographic studies, pulse oximetry and re-evaluation of patient's condition.  Final Clinical Impressions(s) / ED Diagnoses   Final diagnoses:  Hypokalemia  Dehydration  Paroxysmal atrial fibrillation (HCC)  Nausea    As patient has had palpitations over the weekend, she may have flipped into this at that time. She has been  placed on diltiazem drip we are replacing electrolytes and keeping the patient NPO due to ongoing nausea.      Tennie Grussing, MD 06/23/18 [phone]

## 2018-06-23 NOTE — ED Notes (Signed)
Requested patient to urinate. 

## 2018-06-23 NOTE — Telephone Encounter (Signed)
ERROR

## 2018-06-23 NOTE — ED Notes (Signed)
ED TO INPATIENT HANDOFF REPORT  Name/Age/Gender Brandi [name] 62 y.o. female  Code Status    Code Status Orders  (From admission, onward)        Start     Ordered   06/23/18 0737  Full code  Continuous     06/23/18 0739    Code Status History    Date Active Date Inactive Code Status Order ID Comments User Context   12/05/2016 0145 12/08/2016 1550 Full Code [id number]  Edwin Dada, MD Inpatient   06/29/2013 1804 06/30/2013 1628 Full Code [id number]  Theodis Blaze, MD Inpatient      Home/SNF/Other Home  Chief Complaint fever   Level of Care/Admitting Diagnosis ED Disposition    ED Disposition Condition [place]: [facility] [[id number]]  Level of Care: Stepdown [14]  Admit to SDU based on following criteria: Cardiac Instability:  Patients experiencing chest pain, unconfirmed MI and stable, arrhythmias and CHF requiring medical management and potentially compromising patient's stability  Diagnosis: Atrial fibrillation with RVR [facility]) [[id number]]  Admitting Physician: Rise Patience [phone]  Attending Physician: Rise Patience [phone]  Estimated length of stay: past midnight tomorrow  Certification:: I certify this patient will need inpatient services for at least 2 midnights  PT Class (Do Not Modify): Inpatient [101]  PT Acc Code (Do Not Modify): Private [1]       Medical History Past Medical History:  Diagnosis Date  . Allergic rhinitis   . Anemia   . Arthritis HX RIGHT ANKLE FX  . Cancer [facility])    breast, lumpectomy  . Depression    MAJOR  . Dyslipidemia   . GERD (gastroesophageal reflux disease)   . History of breast cancer DX 2010--  S/P LUMPECTOMY AND RADIATION -- NO RECURRENCE   ONCOLOGIST- DR Truddie Coco  . Hypertension   . Left flank pain   . Left ureteral calculus   . Migraines   . Schizophrenic disorder ([place])   . 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 Type II or  unspecified type diabetes mellitus without mention of complication, not stated as uncontrolled 03/16/2014    Allergies Allergies  Allergen Reactions  . Penicillins Rash    Has patient had a PCN reaction causing immediate rash, facial/tongue/throat swelling, SOB or lightheadedness with hypotension: No Has patient had a PCN reaction causing severe rash involving mucus membranes or skin necrosis: No Has patient had a PCN reaction that required hospitalization No Has patient had a PCN reaction occurring within the last 10 years: No If all of the above answers are "NO", then may proceed with Cephalosporin use.     IV Location/Drains/Wounds Patient Lines/Drains/Airways Status   Active Line/Drains/Airways    Name:   Placement date:   Placement time:   Site:   Days:   Peripheral IV 06/23/18 Right Antecubital   06/23/18    0154    Antecubital   less than 1   Peripheral IV 06/23/18 Right Hand   06/23/18    0548    Hand   less than 1          Labs/Imaging Results for orders placed or performed during the hospital encounter of 06/23/18 (from the past 48 hour(s))  CBG monitoring, ED     Status: Abnormal   Collection Time: 06/22/18  9:18 PM  Result Value Ref Range   Glucose-Capillary 105 (H) 70 - 99 mg/dL  Lipase, blood  Status: None   Collection Time: 06/23/18  1:19 AM  Result Value Ref Range   Lipase 37 11 - 51 U/L    Comment: Performed at [facility], [place] [address], [place], [place] [address]  Comprehensive metabolic panel     Status: Abnormal   Collection Time: 06/23/18  1:19 AM  Result Value Ref Range   Sodium 137 135 - 145 mmol/L   Potassium 3.2 (L) 3.5 - 5.1 mmol/L   Chloride 98 98 - 111 mmol/L    Comment: Please note change in reference range.   CO2 27 22 - 32 mmol/L   Glucose, Bld 134 (H) 70 - 99 mg/dL    Comment: Please note change in reference range.   BUN 18 8 - 23 mg/dL    Comment: Please note change in reference range.   Creatinine, Ser 0.87 0.44  - 1.00 mg/dL   Calcium 9.0 8.9 - 10.3 mg/dL   Total Protein 6.4 (L) 6.5 - 8.1 g/dL   Albumin 3.5 3.5 - 5.0 g/dL   AST 40 15 - 41 U/L   ALT 101 (H) 0 - 44 U/L    Comment: Please note change in reference range.   Alkaline Phosphatase 249 (H) 38 - 126 U/L   Total Bilirubin 1.1 0.3 - 1.2 mg/dL   GFR calc non Af Amer >60 >60 mL/min   GFR calc Af Amer >60 >60 mL/min    Comment: (NOTE) The eGFR has been calculated using the CKD EPI equation. This calculation has not been validated in all clinical situations. eGFR's persistently <60 mL/min signify possible Chronic Kidney Disease.    Anion gap 12 5 - 15    Comment: Performed at [facility], [place] [address], [place], [place] [address]  CBC     Status: Abnormal   Collection Time: 06/23/18  1:19 AM  Result Value Ref Range   WBC 12.1 (H) 4.0 - 10.5 K/uL   RBC 5.26 (H) 3.87 - 5.11 MIL/uL   Hemoglobin 10.1 (L) 12.0 - 15.0 g/dL   HCT 35.1 (L) 36.0 - 46.0 %   MCV 66.7 (L) 78.0 - 100.0 fL   MCH 19.2 (L) 26.0 - 34.0 pg   MCHC 28.8 (L) 30.0 - 36.0 g/dL   RDW 19.2 (H) 11.5 - 15.5 %   Platelets 600 (H) 150 - 400 K/uL    Comment: Performed at [facility], [place] [address], [place], [place] [address]  Brain natriuretic peptide     Status: Abnormal   Collection Time: 06/23/18  1:19 AM  Result Value Ref Range   B Natriuretic Peptide 689.0 (H) 0.0 - 100.0 pg/mL    Comment: Performed at [facility], [place] [address], [place], [place] [address]  Magnesium     Status: None   Collection Time: 06/23/18  4:32 AM  Result Value Ref Range   Magnesium 1.8 1.7 - 2.4 mg/dL    Comment: Performed at [facility], [place] [address], [place], [place] [address]  Protime-INR     Status: Abnormal   Collection Time: 06/23/18  4:32 AM  Result Value Ref Range   Prothrombin Time 16.5 (H) 11.4 - 15.2 seconds   INR 1.34     Comment: Performed at [facility], [place] [address]  [place], [place] [address]  Urinalysis, Routine w reflex microscopic     Status: None   Collection Time: 06/23/18  4:34 AM  Result Value Ref Range  Color, Urine YELLOW YELLOW   APPearance CLEAR CLEAR   Specific Gravity, Urine 1.014 1.005 - 1.030   pH 6.0 5.0 - 8.0   Glucose, UA NEGATIVE NEGATIVE mg/dL   Hgb urine dipstick NEGATIVE NEGATIVE   Bilirubin Urine NEGATIVE NEGATIVE   Ketones, ur NEGATIVE NEGATIVE mg/dL   Protein, ur NEGATIVE NEGATIVE mg/dL   Nitrite NEGATIVE NEGATIVE   Leukocytes, UA NEGATIVE NEGATIVE    Comment: Performed at [facility], [place] [address], [place], [place] [address]  I-Stat Troponin, ED (not at [facility])     Status: None   Collection Time: 06/23/18  4:37 AM  Result Value Ref Range   Troponin i, poc 0.02 0.00 - 0.08 ng/mL   Comment 3            Comment: Due to the release kinetics of cTnI, a negative result within the first hours of the onset of symptoms does not rule out myocardial infarction with certainty. If myocardial infarction is still suspected, repeat the test at appropriate intervals.   Rapid urine drug screen (hospital performed)     Status: Abnormal   Collection Time: 06/23/18  4:48 AM  Result Value Ref Range   Opiates NONE DETECTED NONE DETECTED   Cocaine NONE DETECTED NONE DETECTED   Benzodiazepines NONE DETECTED NONE DETECTED   Amphetamines NONE DETECTED NONE DETECTED   Tetrahydrocannabinol NONE DETECTED NONE DETECTED   Barbiturates (A) NONE DETECTED    Result not available. Reagent lot number recalled by manufacturer.    Comment: Performed at [facility], [place] [address], [place], [place] [address]  Acetaminophen level     Status: Abnormal   Collection Time: 06/23/18  5:51 AM  Result Value Ref Range   Acetaminophen (Tylenol), Serum <10 (L) 10 - 30 ug/mL    Comment: (NOTE) Therapeutic concentrations vary significantly. A range of 10-30 ug/mL  may be an effective concentration for many patients. However,  some  are best treated at concentrations outside of this range. Acetaminophen concentrations >150 ug/mL at 4 hours after ingestion  and >50 ug/mL at 12 hours after ingestion are often associated with  toxic reactions. Performed at [facility], [place] [address], [place], [place] [address]   Salicylate level     Status: None   Collection Time: 06/23/18  5:51 AM  Result Value Ref Range   Salicylate Lvl <5.0 2.8 - 30.0 mg/dL    Comment: Performed at [facility], [place] [address], [place], [place] [address]  CBG monitoring, ED     Status: Abnormal   Collection Time: 06/23/18  8:13 AM  Result Value Ref Range   Glucose-Capillary 136 (H) 70 - 99 mg/dL   Dg Chest Portable 1 View  Result Date: 06/23/2018 CLINICAL DATA:  Heart palpitations. EXAM: PORTABLE CHEST 1 VIEW COMPARISON:  Frontal and lateral views 06/20/2018 FINDINGS: Unchanged mild cardiomegaly. Mediastinal contours are unchanged. Small pleural effusions better characterized on lung bases from abdominal CT earlier this day. No pulmonary edema or focal consolidation. No pneumothorax. Surgical clips in the left axilla. IMPRESSION: Cardiomegaly with small pleural effusions. Electronically Signed   By: Jeb Levering M.D.   On: 06/23/2018 05:06   Ct Renal Stone Study  Result Date: 06/23/2018 CLINICAL DATA:  Abdominal pain and emesis. EXAM: CT ABDOMEN AND PELVIS WITHOUT CONTRAST TECHNIQUE: Multidetector CT imaging of the abdomen and pelvis was performed following the standard protocol without IV contrast. COMPARISON:  CT 1 week prior 06/16/2018 FINDINGS: Lower chest: Small  pleural effusions have decreased in the interim. Heart remains enlarged. Small hiatal hernia. Hepatobiliary: Decreased hepatic density suggesting steatosis. Possible capsular nodularity again seen. No discrete focal lesion. Clips in the gallbladder fossa postcholecystectomy. No biliary dilatation. Pancreas: No ductal dilatation or inflammation.  Spleen: Normal in size without focal abnormality. Adrenals/Urinary Tract: Normal adrenal glands. No hydronephrosis. Bilateral nonobstructing renal calculi, unchanged from prior. Mild bilateral perinephric edema is similar to prior exam, slightly more prominent on the left. Urinary bladder is physiologically distended. No stone or bladder wall thickening. Stomach/Bowel: Small hiatal hernia. Few prominent fluid-filled left upper quadrant bowel loops without obstruction or inflammatory change. Mild residual enteric contrast in the colon from prior CT. Colonic diverticulosis without diverticulitis. Appendix not confidently visualized. Vascular/Lymphatic: Minimal aortic atherosclerosis. No aneurysm. No enlarged abdominal or pelvic lymph nodes. Reproductive: Uterus and bilateral adnexa are unremarkable. Other: No free air, free fluid, or intra-abdominal fluid collection. Small fat containing umbilical hernia. Musculoskeletal: Degenerative change in the spine. Bone island in the left sacrum. There are no acute or suspicious osseous abnormalities. IMPRESSION: 1. No acute findings in the abdomen/pelvis. 2. Bilateral nonobstructing renal stones. Colonic diverticulosis without diverticulitis. Electronically Signed   By: Jeb Levering M.D.   On: 06/23/2018 04:45    Pending Labs Unresulted Labs (From admission, onward)   Start     Ordered   06/24/18 0500  Protime-INR  Tomorrow morning,   R     06/23/18 0739   06/24/18 0500  CBC  Tomorrow morning,   R     06/23/18 0739   06/24/18 0500  Comprehensive metabolic panel  Tomorrow morning,   R     06/23/18 0811   06/23/18 0813  Hepatitis panel, acute  Once,   R     06/23/18 0813   06/23/18 0734  Brain natriuretic peptide  Once,   R     06/23/18 0739   06/23/18 0734  Occult blood card to lab, stool  Once,   R     06/23/18 0739   06/23/18 0733  TSH  Once,   R     06/23/18 0739   06/23/18 0733  T4, free  Once,   R     06/23/18 0739      Vitals/Pain Today's  Vitals   06/23/18 0645 06/23/18 0700 06/23/18 0715 06/23/18 0730  BP: 115/74 114/80 105/73 123/81  Pulse: 88 89 86 87  Resp: '16 18 15 17  ' Temp:      TempSrc:      SpO2: 93% 98% 98% 96%  Weight:      Height:      PainSc:        Isolation Precautions No active isolations  Medications Medications  potassium chloride 10 mEq in 100 mL IVPB (10 mEq Intravenous New Bag/Given 06/23/18 0731)  diltiazem (CARDIZEM) 1 mg/mL load via infusion 10 mg (10 mg Intravenous Bolus from Bag 06/23/18 0549)    And  diltiazem (CARDIZEM) 100 mg in dextrose 5% 157m (1 mg/mL) infusion (5 mg/hr Intravenous New Bag/Given 06/23/18 0550)  aspirin EC tablet 81 mg (has no administration in time range)  atorvastatin (LIPITOR) tablet 20 mg (has no administration in time range)  busPIRone (BUSPAR) tablet 15-30 mg (has no administration in time range)  Lurasidone HCl TABS 120 mg (has no administration in time range)  levETIRAcetam (KEPPRA) tablet 1,000 mg (has no administration in time range)  lisinopril (PRINIVIL,ZESTRIL) tablet 5 mg (has no administration in time range)  LORazepam (ATIVAN) tablet 0.5-1 mg (  has no administration in time range)  famotidine (PEPCID) IVPB 20 mg premix (has no administration in time range)  venlafaxine XR (EFFEXOR-XR) 24 hr capsule 300 mg (has no administration in time range)  Lacosamide TABS 100 mg (has no administration in time range)  0.9 %  sodium chloride infusion (has no administration in time range)  HYDROcodone-acetaminophen (NORCO/VICODIN) 5-325 MG per tablet 1-2 tablet (has no administration in time range)  senna-docusate (Senokot-S) tablet 1 tablet (has no administration in time range)  bisacodyl (DULCOLAX) suppository 10 mg (has no administration in time range)  ondansetron (ZOFRAN) tablet 4 mg (has no administration in time range)    Or  ondansetron (ZOFRAN) injection 4 mg (has no administration in time range)  insulin aspart (novoLOG) injection 0-9 Units (has no  administration in time range)  gi cocktail (Maalox,Lidocaine,Donnatal) (has no administration in time range)  ondansetron (ZOFRAN) injection 4 mg (4 mg Intravenous Given 06/23/18 0154)  magnesium sulfate IVPB 1 g 100 mL (0 g Intravenous Stopped 06/23/18 0659)  sodium chloride 0.9 % bolus 500 mL (0 mLs Intravenous Stopped 06/23/18 0659)    Mobility non-ambulatory

## 2018-06-24 DIAGNOSIS — R112 Nausea with vomiting, unspecified: Secondary | ICD-10-CM

## 2018-06-24 DIAGNOSIS — E1159 Type 2 diabetes mellitus with other circulatory complications: Secondary | ICD-10-CM

## 2018-06-24 DIAGNOSIS — D649 Anemia, unspecified: Secondary | ICD-10-CM

## 2018-06-24 DIAGNOSIS — E785 Hyperlipidemia, unspecified: Secondary | ICD-10-CM

## 2018-06-24 DIAGNOSIS — I10 Essential (primary) hypertension: Secondary | ICD-10-CM

## 2018-06-24 DIAGNOSIS — G9341 Metabolic encephalopathy: Secondary | ICD-10-CM

## 2018-06-24 DIAGNOSIS — E1169 Type 2 diabetes mellitus with other specified complication: Secondary | ICD-10-CM

## 2018-06-24 LAB — CBC
HEMATOCRIT: 32.9 % — AB (ref 36.0–46.0)
HEMOGLOBIN: 9.4 g/dL — AB (ref 12.0–15.0)
MCH: 19.3 pg — ABNORMAL LOW (ref 26.0–34.0)
MCHC: 28.6 g/dL — AB (ref 30.0–36.0)
MCV: 67.4 fL — ABNORMAL LOW (ref 78.0–100.0)
Platelets: 544 10*3/uL — ABNORMAL HIGH (ref 150–400)
RBC: 4.88 MIL/uL (ref 3.87–5.11)
RDW: 19.6 % — ABNORMAL HIGH (ref 11.5–15.5)
WBC: 10.1 10*3/uL (ref 4.0–10.5)

## 2018-06-24 LAB — AMMONIA: AMMONIA: 22 umol/L (ref 9–35)

## 2018-06-24 LAB — COMPREHENSIVE METABOLIC PANEL
ALT: 89 U/L — ABNORMAL HIGH (ref 0–44)
ANION GAP: 8 (ref 5–15)
AST: 47 U/L — ABNORMAL HIGH (ref 15–41)
Albumin: 3.2 g/dL — ABNORMAL LOW (ref 3.5–5.0)
Alkaline Phosphatase: 223 U/L — ABNORMAL HIGH (ref 38–126)
BILIRUBIN TOTAL: 1.4 mg/dL — AB (ref 0.3–1.2)
BUN: 12 mg/dL (ref 8–23)
CHLORIDE: 107 mmol/L (ref 98–111)
CO2: 24 mmol/L (ref 22–32)
Calcium: 8.3 mg/dL — ABNORMAL LOW (ref 8.9–10.3)
Creatinine, Ser: 0.75 mg/dL (ref 0.44–1.00)
GFR calc Af Amer: >60 mL/min (ref >60)
Glucose, Bld: 130 mg/dL — ABNORMAL HIGH (ref 70–99)
POTASSIUM: 3.5 mmol/L (ref 3.5–5.1)
Sodium: 139 mmol/L (ref 135–145)
Total Protein: 5.7 g/dL — ABNORMAL LOW (ref 6.5–8.1)

## 2018-06-24 LAB — HEPATITIS PANEL, ACUTE
HEP A IGM: NEGATIVE
Hep B C IgM: NEGATIVE
Hepatitis B Surface Ag: NEGATIVE

## 2018-06-24 LAB — GLUCOSE, CAPILLARY
GLUCOSE-CAPILLARY: 129 mg/dL — AB (ref 70–99)
GLUCOSE-CAPILLARY: 147 mg/dL — AB (ref 70–99)
GLUCOSE-CAPILLARY: 111 mg/dL — AB (ref 70–99)
Glucose-Capillary: 134 mg/dL — ABNORMAL HIGH (ref 70–99)

## 2018-06-24 LAB — PROTIME-INR
INR: 1.37
Prothrombin Time: 16.7 seconds — ABNORMAL HIGH (ref 11.4–15.2)

## 2018-06-24 MED ORDER — LORAZEPAM 0.5 MG PO TABS: 0.5000 mg | ORAL_TABLET | Freq: Every evening | ORAL | Status: DC | PRN

## 2018-06-24 MED ORDER — METOPROLOL TARTRATE 25 MG PO TABS
25.0000 mg | ORAL_TABLET | Freq: Two times a day (BID) | ORAL | Status: DC
  Administered 2018-06-24 – 2018-06-25 (×3): 25 mg via ORAL
  Filled 2018-06-24 (×3): qty 1

## 2018-06-24 MED ORDER — HYDROCODONE-ACETAMINOPHEN 5-325 MG PO TABS
1.0000 | ORAL_TABLET | Freq: Three times a day (TID) | ORAL | Status: DC | PRN
  Administered 2018-06-28 (×2): 1 via ORAL
  Filled 2018-06-24 (×2): qty 1

## 2018-06-24 NOTE — Progress Notes (Signed)
PT Cancellation Note  Patient Details Name: IRASEMA CHALK MRN: [id number] DOB: 09/26/1956   Cancelled Treatment:    Reason Eval/Treat Not Completed: Medical issues which prohibited therapy, RN reports that patient needs nursing care. Has sat up today at side of bed . Will check back in AM.   Claretha Cooper 06/24/2018, 4:42 PM  Tresa Endo PT [phone]

## 2018-06-24 NOTE — Progress Notes (Signed)
PROGRESS NOTE  Brandi [name] NID:[id number] DOB: [birth year] DOA: 06/23/2018 PCP: Denita Lung, MD  HPI/Recap of past 90 hours: 62 year old female with medical history significant for hypertension, hyperlipidemia, depression, seizures, left breast cancer status post lumpectomy in 2009, GERD presents to the ED complaining of generalized abdominal pain, nausea/vomiting, weakness, decreased appetite for the past few days.  Of note, patient saw PCP who noted elevated LFTs and ordered a CT abdomen pelvis which was unremarkable except for possible cirrhosis.  In the ED, right upper quadrant ultrasound done showed possible cirrhosis.  Patient was noted to have A. fib with RVR of which she was started on Cardizem drip and admitted to the stepdown unit.  Cardiology consulted, 2D echo showed decreased EF of 30 to 35% with hypokinesis.  Patient admitted for further management.  Today, patient was noted to be very sleepy, but easily arousable.  Patient was able to tell me where she is, but noted to be dozing off.  Patient was able to follow commands.  Patient denied any worsening abdominal pain, nausea/vomiting/diarrhea, fever/chills, chest pain.  Assessment/Plan: Principal Problem:   Atrial fibrillation with RVR (HCC) Active Problems:   Major depression   Migraine headache   GERD (gastroesophageal reflux disease)   History of breast cancer in female   Hyperlipidemia associated with type 2 diabetes mellitus (HCC)   Leukocytosis   Type 2 diabetes mellitus without complication, without long-term current use of insulin (HCC)   Hypertension associated with diabetes ([place])   Anemia   Elevated LFTs   Malaise   Schizophrenia (HCC)   History of seizures   Nausea and vomiting  Acute metabolic encephalopathy Unknown etiology, ??hepatic Vs med-induced (on multiple sedating meds) Afebrile, with resolved leukocytosis No signs/symptoms of infection/sepsis Elevated LFTs, with T bili 1.4, ammonia  pending Negative for infection BC x2, UC pending UDS negative CT abdomen/pelvis, RUQ USS showed possible cirrhosis CT renal stone showed bilateral nonobstructing renal stones. Colonic diverticulosis without diverticulitis CT head with no acute intracranial abnormalities Adjusted sedating meds (home ativan) Clear liquids for now Monitor closely in SDU  Abnormal LFTs/nausea/vomiting Likely due to ?cirrhosis, new onset Afebrile, resolved leukocytosis Tylenol, salicylate levels negative, lipase normal, INR elevated Hepatitis panel negative CT abdomen/pelvis, RUQ USS showed possible cirrhosis CT renal stone showed bilateral nonobstructing renal stones. Colonic diverticulosis without diverticulitis Supportive management May need GI evaluation Daily CMP  New onset paroxysmal A. Fib Currently in NSR, tachycardic s/p Cardizem drip Chest x-ray showed cardiomegaly with small effusions TSH/free T4 within normal limits 2D echo showed EF of 30 to 35%, severe hypokinesis of the anteroseptal, anterior, inferoseptal and apical myocardium, moderate pulmonary hypertension CHA2DS2-VASc: 3, holding off on AC until LFTs/anemia improves Cardiology on board: Start Lopressor twice daily  Newly diagnosed combined systolic/diastolic HF BNP 144, appears euvolemic 2D echo showed EF of 30 to 35%, severe hypokinesis of the anteroseptal, anterior, inferoseptal and apical myocardium, moderate pulmonary hypertension Cardiology on board: Plan for last catheterization once encephalopathy improves Monitor closely  Anemia on chronic disease/microcytic Baseline hgb 9-10 No signs of bleeding, FOBT pending Iron panel pending Daily CBC  Hypertension BP uncontrolled Continue lisinopril, started on metoprolol  Hyperlipidemia Recent lipid profile pending Hold Lipitor for now due to abnormal LFTs  Type II DM Last A1c 6.6 Sensitive sliding scale,   GERD Continue famotidine  History of seizures Continue  Keppra, Vimpat  Anxiety/depression Continue Ativan as needed, Buspar, Effexor, Latuda     Code Status: Full  Family Communication: None at bedside  Disposition Plan: Once work-up complete   Consultants:  Cardiology  Procedures:  None  Antimicrobials:  None  DVT prophylaxis: SCDs   Objective: Vitals:   06/24/18 1030 06/24/18 1100 06/24/18 1130 06/24/18 1200  BP: 117/71 94/68 (!) 154/95 (!) 164/72  Pulse: 97 (!) 101 (!) 106 (!) 103  Resp: 19 (!) 22 (!) 33 (!) 26  Temp:    97.6 F (36.4 C)  TempSrc:    Oral  SpO2: 93% 95% 91% 90%  Weight:      Height:        Intake/Output Summary (Last 24 hours) at 06/24/2018 1251 Last data filed at 06/24/2018 1213 Gross per 24 hour  Intake 1966.91 ml  Output 1810 ml  Net 156.91 ml   Filed Weights   06/23/18 0155 06/23/18 0922 06/24/18 0500  Weight: 72.1 kg (159 lb) 74.6 kg (164 lb 7.4 oz) 73.8 kg (162 lb 11.2 oz)    Exam:   General: Sleepy, lethargic, easily arousable  Cardiovascular: S1, S2 present  Respiratory: CTA B  Abdomen: Soft, nontender, nondistended, bowel sounds present  Musculoskeletal: No pedal edema bilaterally  Skin: Normal  Psychiatry: Unable to assess   Data Reviewed: CBC: Recent Labs  Lab 06/20/18 1159 06/22/18 1350 06/23/18 0119 06/24/18 0312  WBC 8.7 15.1* 12.1* 10.1  NEUTROABS 6.5 11.5*  --   --   HGB 10.5* 9.9* 10.1* 9.4*  HCT 36.5 32.8* 35.1* 32.9*  MCV 66.5* 64* 66.7* 67.4*  PLT 556* 555* 600* 315*   Basic Metabolic Panel: Recent Labs  Lab 06/20/18 1159 06/22/18 1350 06/23/18 0119 06/23/18 0432 06/24/18 0312  NA 138 138 137  --  139  K 3.5 4.4 3.2*  --  3.5  CL 100 99 98  --  107  CO2 26 30* 27  --  24  GLUCOSE 159* 139* 134*  --  130*  BUN 16 19 18   --  12  CREATININE 0.83 0.89 0.87  --  0.75  CALCIUM 9.0 9.7 9.0  --  8.3*  MG  --   --   --  1.8  --    GFR: Estimated Creatinine Clearance: 65.4 mL/min (by C-G formula based on SCr of 0.75 mg/dL). Liver  Function Tests: Recent Labs  Lab 06/20/18 1159 06/22/18 1350 06/23/18 0119 06/24/18 0312  AST 51* 35 40 47*  ALT 163* 115* 101* 89*  ALKPHOS 267* 307* 249* 223*  BILITOT 1.3* 1.1 1.1 1.4*  PROT 6.0* 5.9* 6.4* 5.7*  ALBUMIN 3.3* 3.8 3.5 3.2*   Recent Labs  Lab 06/20/18 1159 06/23/18 0119  LIPASE 35 37   No results for input(s): AMMONIA in the last 168 hours. Coagulation Profile: Recent Labs  Lab 06/20/18 1159 06/23/18 0432 06/24/18 0312  INR 1.28 1.34 1.37   Cardiac Enzymes: No results for input(s): CKTOTAL, CKMB, CKMBINDEX, TROPONINI in the last 168 hours. BNP (last 3 results) No results for input(s): PROBNP in the last 8760 hours. HbA1C: No results for input(s): HGBA1C in the last 72 hours. CBG: Recent Labs  Lab 06/23/18 1138 06/23/18 1732 06/23/18 2109 06/24/18 0748 06/24/18 1217  GLUCAP 173* 127* 240* 129* 134*   Lipid Profile: No results for input(s): CHOL, HDL, LDLCALC, TRIG, CHOLHDL, LDLDIRECT in the last 72 hours. Thyroid Function Tests: Recent Labs    06/23/18 0929  TSH 0.532  FREET4 0.94   Anemia Panel: No results for input(s): VITAMINB12, FOLATE, FERRITIN, TIBC, IRON, RETICCTPCT in the last 72 hours. Urine analysis:    Component Value  Date/Time   COLORURINE YELLOW 06/23/2018 [place] 06/23/2018 0434   APPEARANCEUR Cloudy 06/20/2013 1745   LABSPEC 1.014 06/23/2018 0434   LABSPEC 1.012 06/20/2013 1745   PHURINE 6.0 06/23/2018 0434   GLUCOSEU NEGATIVE 06/23/2018 0434   GLUCOSEU Negative 06/20/2013 1745   HGBUR NEGATIVE 06/23/2018 0434   BILIRUBINUR NEGATIVE 06/23/2018 0434   BILIRUBINUR n 05/17/2015 1425   BILIRUBINUR Negative 06/20/2013 1745   KETONESUR NEGATIVE 06/23/2018 0434   PROTEINUR NEGATIVE 06/23/2018 0434   UROBILINOGEN negative 05/17/2015 1425   UROBILINOGEN 0.2 06/29/2013 1530   NITRITE NEGATIVE 06/23/2018 0434   LEUKOCYTESUR NEGATIVE 06/23/2018 0434   LEUKOCYTESUR 3+ 06/20/2013 1745   Sepsis  Labs: @LABRCNTIP (procalcitonin:4,lacticidven:4)  ) Recent Results (from the past 240 hour(s))  MRSA PCR Screening     Status: None   Collection Time: 06/23/18 10:33 AM  Result Value Ref Range Status   MRSA by PCR NEGATIVE NEGATIVE Final    Comment:        The GeneXpert MRSA Assay (FDA approved for NASAL specimens only), is one component of a comprehensive MRSA colonization surveillance program. It is not intended to diagnose MRSA infection nor to guide or monitor treatment for MRSA infections. Performed at [facility], [place] [address], [place], [place] [address]       Studies: No results found.  Scheduled Meds: . aspirin EC  81 mg Oral Daily  . atorvastatin  20 mg Oral Daily  . busPIRone  15 mg Oral Daily  . busPIRone  30 mg Oral QHS  . insulin aspart  0-9 Units Subcutaneous TID WC  . lacosamide  100 mg Oral BID  . levETIRAcetam  1,000 mg Oral BID  . lisinopril  5 mg Oral Daily  . lurasidone  120 mg Oral Daily  . metoprolol tartrate  25 mg Oral BID  . venlafaxine XR  300 mg Oral Daily    Continuous Infusions: . diltiazem (CARDIZEM) infusion Stopped (06/24/18 0226)  . famotidine (PEPCID) IV Stopped (06/24/18 1012)     LOS: 1 day     Alma Friendly, MD [facility]   If 7PM-7AM, please contact night-coverage [email or web address] Password [email or web address] 06/24/2018, 12:51 PM

## 2018-06-24 NOTE — Progress Notes (Addendum)
Progress Note  Patient Name: Brandi [name] Date of Encounter: 06/24/2018  Primary Cardiologist: Mertie Moores, MD   Subjective   Pt is drowsy and not very responsive, nursing reports this is near her cognitive baseline. She converted to NSR overnight. Dilt off.  Inpatient Medications    Scheduled Meds: . aspirin EC  81 mg Oral Daily  . atorvastatin  20 mg Oral Daily  . busPIRone  15 mg Oral Daily  . busPIRone  30 mg Oral QHS  . insulin aspart  0-9 Units Subcutaneous TID WC  . lacosamide  100 mg Oral BID  . levETIRAcetam  1,000 mg Oral BID  . lisinopril  5 mg Oral Daily  . LORazepam  0.5-1 mg Oral BID  . lurasidone  120 mg Oral Daily  . venlafaxine XR  300 mg Oral Daily   Continuous Infusions: . diltiazem (CARDIZEM) infusion Stopped (06/24/18 0226)  . famotidine (PEPCID) IV Stopped (06/24/18 1012)   PRN Meds: bisacodyl, gi cocktail, HYDROcodone-acetaminophen, ondansetron **OR** ondansetron (ZOFRAN) IV, senna-docusate   Vital Signs    Vitals:   06/24/18 0600 06/24/18 0700 06/24/18 0730 06/24/18 0800  BP: (!) 148/72 (!) 148/79 (!) 146/87 121/86  Pulse: 92 (!) 105 95 95  Resp: 13 17 10  (!) 21  Temp:    97.9 F (36.6 C)  TempSrc:    Oral  SpO2: 99% 95% 91% 91%  Weight:      Height:        Intake/Output Summary (Last 24 hours) at 06/24/2018 1054 Last data filed at 06/24/2018 0600 Gross per 24 hour  Intake 1953.88 ml  Output 1010 ml  Net 943.88 ml   Filed Weights   06/23/18 0155 06/23/18 0922 06/24/18 0500  Weight: 159 lb (72.1 kg) 164 lb 7.4 oz (74.6 kg) 162 lb 11.2 oz (73.8 kg)    Telemetry    Sinus rhythm, 1st degree heart block, HR in the 90s with PVCs - Personally Reviewed  ECG    Sinus rhythm, first degree heart block - Personally Reviewed  Physical Exam   GEN: Drowsy, not very responsive  Neck: No JVD Cardiac: RRR, no murmurs, rubs, or gallops.  Respiratory: Clear to auscultation bilaterally. GI: Soft, nontender, non-distended  MS: trace  edema; No deformity. Neuro:  Nonfocal  Psych: Normal affect   Labs    Chemistry Recent Labs  Lab 06/20/18 1159 06/22/18 1350 06/23/18 0119 06/24/18 0312  NA 138 138 137 139  K 3.5 4.4 3.2* 3.5  CL 100 99 98 107  CO2 26 30* 27 24  GLUCOSE 159* 139* 134* 130*  BUN 16 19 18 12   CREATININE 0.83 0.89 0.87 0.75  CALCIUM 9.0 9.7 9.0 8.3*  PROT 6.0* 5.9* 6.4* 5.7*  ALBUMIN 3.3* 3.8 3.5 3.2*  AST 51* 35 40 47*  ALT 163* 115* 101* 89*  ALKPHOS 267* 307* 249* 223*  BILITOT 1.3* 1.1 1.1 1.4*  GFRNONAA >60 70 >60 >60  GFRAA >60 80 >60 >60  ANIONGAP 12  --  12 8     Hematology Recent Labs  Lab 06/22/18 1350 06/23/18 0119 06/24/18 0312  WBC 15.1* 12.1* 10.1  RBC 5.13 5.26* 4.88  HGB 9.9* 10.1* 9.4*  HCT 32.8* 35.1* 32.9*  MCV 64* 66.7* 67.4*  MCH 19.3* 19.2* 19.3*  MCHC 30.2* 28.8* 28.6*  RDW 22.9* 19.2* 19.6*  PLT 555* 600* 544*    Cardiac EnzymesNo results for input(s): TROPONINI in the last 168 hours.  Recent Labs  Lab 06/23/18 [phone]  TROPIPOC 0.02     BNP Recent Labs  Lab 06/23/18 0119 06/23/18 0929  BNP 689.0* 607.3*     DDimer No results for input(s): DDIMER in the last 168 hours.   Radiology    Ct Head Wo Contrast  Result Date: 06/23/2018 CLINICAL DATA:  Weakness, leukocytosis, hypertension, type II diabetes mellitus, seizure disorder, schizophrenia, migraines, LEFT breast cancer post lumpectomy, vomiting EXAM: CT HEAD WITHOUT CONTRAST TECHNIQUE: Contiguous axial images were obtained from the base of the skull through the vertex without intravenous contrast. Sagittal and coronal MPR images reconstructed from axial data set. COMPARISON:  07/13/2009 FINDINGS: Brain: Generalized atrophy. Normal ventricular morphology. No midline shift or mass effect. Small vessel chronic ischemic changes of deep cerebral white matter. No intracranial hemorrhage, mass lesion, evidence of acute infarction, or extra-axial fluid collection. Vascular: No hyperdense vessels Skull:  Intact Sinuses/Orbits: Clear Other: N/A IMPRESSION: Atrophy with small vessel chronic ischemic changes of deep cerebral white matter. No acute intracranial abnormalities. Electronically Signed   By: Lavonia Dana M.D.   On: 06/23/2018 08:37   Dg Chest Portable 1 View  Result Date: 06/23/2018 CLINICAL DATA:  Heart palpitations. EXAM: PORTABLE CHEST 1 VIEW COMPARISON:  Frontal and lateral views 06/20/2018 FINDINGS: Unchanged mild cardiomegaly. Mediastinal contours are unchanged. Small pleural effusions better characterized on lung bases from abdominal CT earlier this day. No pulmonary edema or focal consolidation. No pneumothorax. Surgical clips in the left axilla. IMPRESSION: Cardiomegaly with small pleural effusions. Electronically Signed   By: Jeb Levering M.D.   On: 06/23/2018 05:06   Ct Renal Stone Study  Result Date: 06/23/2018 CLINICAL DATA:  Abdominal pain and emesis. EXAM: CT ABDOMEN AND PELVIS WITHOUT CONTRAST TECHNIQUE: Multidetector CT imaging of the abdomen and pelvis was performed following the standard protocol without IV contrast. COMPARISON:  CT 1 week prior 06/16/2018 FINDINGS: Lower chest: Small pleural effusions have decreased in the interim. Heart remains enlarged. Small hiatal hernia. Hepatobiliary: Decreased hepatic density suggesting steatosis. Possible capsular nodularity again seen. No discrete focal lesion. Clips in the gallbladder fossa postcholecystectomy. No biliary dilatation. Pancreas: No ductal dilatation or inflammation. Spleen: Normal in size without focal abnormality. Adrenals/Urinary Tract: Normal adrenal glands. No hydronephrosis. Bilateral nonobstructing renal calculi, unchanged from prior. Mild bilateral perinephric edema is similar to prior exam, slightly more prominent on the left. Urinary bladder is physiologically distended. No stone or bladder wall thickening. Stomach/Bowel: Small hiatal hernia. Few prominent fluid-filled left upper quadrant bowel loops without  obstruction or inflammatory change. Mild residual enteric contrast in the colon from prior CT. Colonic diverticulosis without diverticulitis. Appendix not confidently visualized. Vascular/Lymphatic: Minimal aortic atherosclerosis. No aneurysm. No enlarged abdominal or pelvic lymph nodes. Reproductive: Uterus and bilateral adnexa are unremarkable. Other: No free air, free fluid, or intra-abdominal fluid collection. Small fat containing umbilical hernia. Musculoskeletal: Degenerative change in the spine. Bone island in the left sacrum. There are no acute or suspicious osseous abnormalities. IMPRESSION: 1. No acute findings in the abdomen/pelvis. 2. Bilateral nonobstructing renal stones. Colonic diverticulosis without diverticulitis. Electronically Signed   By: Jeb Levering M.D.   On: 06/23/2018 04:45    Cardiac Studies   Echo 06/23/18: Study Conclusions - Left ventricle: The cavity size was mildly dilated. Wall   thickness was increased in a pattern of moderate LVH. Systolic   function was moderately to severely reduced. The estimated   ejection fraction was in the range of 30% to 35%. Severe   hypokinesis of the anteroseptal, anterior, inferoseptal, and   apical myocardium. -  Mitral valve: Mildly to moderately calcified annulus. Mobility   was moderately restricted. There was mild to moderate   regurgitation. - Left atrium: The atrium was severely dilated. - Right ventricle: The cavity size was moderately dilated. Wall   thickness was normal. Systolic function was moderately reduced. - Right atrium: The atrium was moderately to severely dilated. - Pulmonary arteries: Systolic pressure was moderately to severely   increased,estimated at approximately 45 mmHg. - Pericardium, extracardiac: A trivial pericardial effusion was   identified.  Patient Profile     62 y.o. female with a hx of breast cancer treated in 2010 w/ lumpectomy (no radiation), DLD, HTN, h/o seizures, GERD, depression and no  prior cardiac history, who is being seen today for the evaluation of new atrial fibrillation, in the setting of acute GI illness.   Converted to NSR overnight, dilt off. Baseline cognitive impairment.  Assessment & Plan    1. New onset atrial fibrillation with RVR - was on cardizem drip running at 6 mg/hr - pt converted to NSR overnight, cardizem off at 3AM - sinus rhythm with HR in the rates in the 90-100s, first degree heart block - pressures 120-140s, were marginal yesteray - This patients CHA2DS2-VASc Score and unadjusted Ischemic Stroke Rate (% per year) is equal to 3.2 % stroke rate/year from a score of 3 (HTN, female, CHF) - holding off on anticoagulation for now until liver function and anemia improve - may need outpatient event monitor as this Afib was likely the result of her GI illness vs anemia - Hb 9.4 today (10.1)   2. New onset systolic heart failure - EF 30-35% with hypokinesis of the anteroseptal, anterior, inferoseptal, and apical myocardium - severely dilated left and right atria - pulmonary hypertension - not on a diuretic regimen, pt does not appear extremely volume overloaded on exam - will likely need a right and left heart cath - unclear duration of Afib as a contributing factor   3. GI illness, abnormal LFTs - per GI and IM   For questions or updates, please contact [place] Please consult [email or web address] for contact info under Cardiology/STEMI.      Signed, Tami Lin Duke, PA  06/24/2018, 10:54 AM     Attending Note:   The patient was seen and examined.  Agree with assessment and plan as noted above.  Changes made to the above note as needed.  Patient seen and independently examined with Doreene Adas, PA .   We discussed all aspects of the encounter. I agree with the assessment and plan as stated above.  1.   Acute on chronic combined systolic and diastolic congestive heart failure:   The patient has segmental wall motion abnormality.   Ideally, we would perform a heart catheterization.  At present she still appears to have an altered mental status and so I would hesitate to do a heart catheterization at this point.  We will put off a heart catheterization until she is mentally more alert.  We will also want her GI issues to resolve.  2.  Paroxysmal atrial fibrillation: The patient is converted back to normal sinus rhythm.  She is in sinus tachycardia today.  We will start metoprolol 25 mg twice a day. She is currently off the diltiazem drip.   I have spent a total of 40 minutes with patient reviewing hospital  notes , telemetry, EKGs, labs and examining patient as well as establishing an assessment and plan that was discussed with the patient. > 50%  of time was spent in direct patient care.    Thayer Headings, Brooke Bonito., MD, [facility] 06/24/2018, 11:37 AM [address]  [place] Pager [phone]

## 2018-06-25 ENCOUNTER — Inpatient Hospital Stay (HOSPITAL_COMMUNITY): Payer: Medicare Other

## 2018-06-25 DIAGNOSIS — I5042 Chronic combined systolic (congestive) and diastolic (congestive) heart failure: Secondary | ICD-10-CM

## 2018-06-25 LAB — LIPID PANEL
CHOL/HDL RATIO: 2.3 ratio
CHOLESTEROL: 76 mg/dL (ref 0–200)
HDL: 33 mg/dL — ABNORMAL LOW (ref >40)
LDL CALC: 28 mg/dL (ref 0–99)
TRIGLYCERIDES: 74 mg/dL (ref <150)
VLDL: 15 mg/dL (ref 0–40)

## 2018-06-25 LAB — IRON AND TIBC
IRON: 14 ug/dL — AB (ref 28–170)
Saturation Ratios: 4 % — ABNORMAL LOW (ref 10.4–31.8)
TIBC: 375 ug/dL (ref 250–450)
UIBC: 361 ug/dL

## 2018-06-25 LAB — COMPREHENSIVE METABOLIC PANEL
ALT: 80 U/L — AB (ref 0–44)
AST: 51 U/L — AB (ref 15–41)
Albumin: 3.2 g/dL — ABNORMAL LOW (ref 3.5–5.0)
Alkaline Phosphatase: 258 U/L — ABNORMAL HIGH (ref 38–126)
Anion gap: 8 (ref 5–15)
BILIRUBIN TOTAL: 1 mg/dL (ref 0.3–1.2)
BUN: 12 mg/dL (ref 8–23)
CALCIUM: 8.9 mg/dL (ref 8.9–10.3)
CO2: 24 mmol/L (ref 22–32)
CREATININE: 0.83 mg/dL (ref 0.44–1.00)
Chloride: 110 mmol/L (ref 98–111)
Glucose, Bld: 136 mg/dL — ABNORMAL HIGH (ref 70–99)
Potassium: 4.1 mmol/L (ref 3.5–5.1)
Sodium: 142 mmol/L (ref 135–145)
TOTAL PROTEIN: 5.6 g/dL — AB (ref 6.5–8.1)

## 2018-06-25 LAB — CBC WITH DIFFERENTIAL/PLATELET
BASOS ABS: 0.1 10*3/uL (ref 0.0–0.1)
BASOS PCT: 1 %
EOS ABS: 0.2 10*3/uL (ref 0.0–0.7)
Eosinophils Relative: 2 %
HCT: 33.4 % — ABNORMAL LOW (ref 36.0–46.0)
HEMOGLOBIN: 9.4 g/dL — AB (ref 12.0–15.0)
Lymphocytes Relative: 16 %
Lymphs Abs: 1.6 10*3/uL (ref 0.7–4.0)
MCH: 19.1 pg — ABNORMAL LOW (ref 26.0–34.0)
MCHC: 28.1 g/dL — ABNORMAL LOW (ref 30.0–36.0)
MCV: 67.7 fL — ABNORMAL LOW (ref 78.0–100.0)
MONO ABS: 0.8 10*3/uL (ref 0.1–1.0)
Monocytes Relative: 8 %
NEUTROS ABS: 7.2 10*3/uL (ref 1.7–7.7)
NEUTROS PCT: 73 %
PLATELETS: 558 10*3/uL — AB (ref 150–400)
RBC: 4.93 MIL/uL (ref 3.87–5.11)
RDW: 19.8 % — AB (ref 11.5–15.5)
WBC: 9.9 10*3/uL (ref 4.0–10.5)

## 2018-06-25 LAB — GLUCOSE, CAPILLARY
Glucose-Capillary: 118 mg/dL — ABNORMAL HIGH (ref 70–99)
Glucose-Capillary: 141 mg/dL — ABNORMAL HIGH (ref 70–99)
Glucose-Capillary: 123 mg/dL — ABNORMAL HIGH (ref 70–99)
Glucose-Capillary: 130 mg/dL — ABNORMAL HIGH (ref 70–99)

## 2018-06-25 LAB — PROTIME-INR
INR: 1.35
Prothrombin Time: 16.5 seconds — ABNORMAL HIGH (ref 11.4–15.2)

## 2018-06-25 LAB — FERRITIN: Ferritin: 9 ng/mL — ABNORMAL LOW (ref 11–307)

## 2018-06-25 MED ORDER — HEPARIN (PORCINE) IN NACL 100-0.45 UNIT/ML-% IJ SOLN: 900.0000 [IU]/h | INTRAMUSCULAR | Status: DC

## 2018-06-25 MED ORDER — SODIUM CHLORIDE 0.9 % IV SOLN
100.0000 mg | Freq: Two times a day (BID) | INTRAVENOUS | Status: DC
  Administered 2018-06-26 – 2018-06-27 (×5): 100 mg via INTRAVENOUS
  Filled 2018-06-25 (×7): qty 10

## 2018-06-25 MED ORDER — FUROSEMIDE 10 MG/ML IJ SOLN
20.0000 mg | Freq: Once | INTRAMUSCULAR | Status: AC
  Administered 2018-06-25: 20 mg via INTRAVENOUS
  Filled 2018-06-25: qty 2

## 2018-06-25 MED ORDER — LEVETIRACETAM IN NACL 1000 MG/100ML IV SOLN
1000.0000 mg | Freq: Two times a day (BID) | INTRAVENOUS | Status: DC
  Administered 2018-06-26 – 2018-06-28 (×5): 1000 mg via INTRAVENOUS
  Filled 2018-06-25 (×6): qty 100

## 2018-06-25 MED ORDER — HEPARIN BOLUS VIA INFUSION
3000.0000 [IU] | Freq: Once | INTRAVENOUS | Status: DC
  Filled 2018-06-25: qty 3000

## 2018-06-25 MED ORDER — LACTULOSE 10 GM/15ML PO SOLN
30.0000 g | Freq: Three times a day (TID) | ORAL | Status: DC
  Administered 2018-06-25: 30 g via ORAL
  Filled 2018-06-25: qty 45

## 2018-06-25 NOTE — Progress Notes (Addendum)
Progress Note  Patient Name: Brandi [name] Date of Encounter: 06/25/2018  Primary Cardiologist: Mertie Moores, MD   Subjective   Pt awake and responsive, says she is breathing ok, denies pain. Not able to help much w/ exam.   Inpatient Medications    Scheduled Meds: . aspirin EC  81 mg Oral Daily  . busPIRone  15 mg Oral Daily  . busPIRone  30 mg Oral QHS  . insulin aspart  0-9 Units Subcutaneous TID WC  . lacosamide  100 mg Oral BID  . levETIRAcetam  1,000 mg Oral BID  . lisinopril  5 mg Oral Daily  . lurasidone  120 mg Oral Daily  . metoprolol tartrate  25 mg Oral BID  . venlafaxine XR  300 mg Oral Daily   Continuous Infusions: . diltiazem (CARDIZEM) infusion Stopped (06/24/18 0226)  . famotidine (PEPCID) IV Stopped (06/24/18 2244)   PRN Meds: bisacodyl, gi cocktail, HYDROcodone-acetaminophen, LORazepam, ondansetron **OR** ondansetron (ZOFRAN) IV, senna-docusate   Vital Signs    Vitals:   06/25/18 0300 06/25/18 0400 06/25/18 0500 06/25/18 0600  BP: (!) 95/57 113/77 99/65 124/90  Pulse:  90 89 93  Resp: (!) 22 (!) 21 (!) 21 (!) 22  Temp:  97.9 F (36.6 C)    TempSrc:  Oral    SpO2:  94% 95% 98%  Weight:   171 lb 8.3 oz (77.8 kg)   Height:        Intake/Output Summary (Last 24 hours) at 06/25/2018 0756 Last data filed at 06/24/2018 1213 Gross per 24 hour  Intake 288.33 ml  Output 800 ml  Net -511.67 ml   Filed Weights   06/23/18 0922 06/24/18 0500 06/25/18 0500  Weight: 164 lb 7.4 oz (74.6 kg) 162 lb 11.2 oz (73.8 kg) 171 lb 8.3 oz (77.8 kg)    Telemetry    Sinus rhythm, not much ectopy  ECG    Sinus rhythm, first degree heart block - Personally Reviewed  Physical Exam   GEN: Drowsy, not very responsive  Neck: No JVD seen  Cardiac: RRR, no murmurs, rubs, or gallops.  Respiratory:  Decreased breath sounds bilaterally, inspiratory effort poor. GI: Soft, nontender, non-distended  MS: trace edema; No deformity. Neuro:  Nonfocal  Psych:  Pleasant affect   Labs    Chemistry Recent Labs  Lab 06/23/18 0119 06/24/18 0312 06/25/18 0335  NA 137 139 142  K 3.2* 3.5 4.1  CL 98 107 110  CO2 27 24 24   GLUCOSE 134* 130* 136*  BUN 18 12 12   CREATININE 0.87 0.75 0.83  CALCIUM 9.0 8.3* 8.9  PROT 6.4* 5.7* 5.6*  ALBUMIN 3.5 3.2* 3.2*  AST 40 47* 51*  ALT 101* 89* 80*  ALKPHOS 249* 223* 258*  BILITOT 1.1 1.4* 1.0  GFRNONAA >60 >60 >60  GFRAA >60 >60 >60  ANIONGAP 12 8 8      Hematology Recent Labs  Lab 06/23/18 0119 06/24/18 0312 06/25/18 0539  WBC 12.1* 10.1 9.9  RBC 5.26* 4.88 4.93  HGB 10.1* 9.4* 9.4*  HCT 35.1* 32.9* 33.4*  MCV 66.7* 67.4* 67.7*  MCH 19.2* 19.3* 19.1*  MCHC 28.8* 28.6* 28.1*  RDW 19.2* 19.6* 19.8*  PLT 600* 544* 558*    Cardiac Enzymes  Recent Labs  Lab 06/23/18 0437  TROPIPOC 0.02     BNP Recent Labs  Lab 06/23/18 0119 06/23/18 0929  BNP 689.0* 607.3*     Radiology    Ct Head Wo Contrast  Result Date: 06/23/2018  CLINICAL DATA:  Weakness, leukocytosis, hypertension, type II diabetes mellitus, seizure disorder, schizophrenia, migraines, LEFT breast cancer post lumpectomy, vomiting EXAM: CT HEAD WITHOUT CONTRAST TECHNIQUE: Contiguous axial images were obtained from the base of the skull through the vertex without intravenous contrast. Sagittal and coronal MPR images reconstructed from axial data set. COMPARISON:  07/13/2009 FINDINGS: Brain: Generalized atrophy. Normal ventricular morphology. No midline shift or mass effect. Small vessel chronic ischemic changes of deep cerebral white matter. No intracranial hemorrhage, mass lesion, evidence of acute infarction, or extra-axial fluid collection. Vascular: No hyperdense vessels Skull: Intact Sinuses/Orbits: Clear Other: N/A IMPRESSION: Atrophy with small vessel chronic ischemic changes of deep cerebral white matter. No acute intracranial abnormalities. Electronically Signed   By: Lavonia Dana M.D.   On: 06/23/2018 08:37    Cardiac  Studies   Echo 06/23/18: Study Conclusions - Left ventricle: The cavity size was mildly dilated. Wall   thickness was increased in a pattern of moderate LVH. Systolic   function was moderately to severely reduced. The estimated   ejection fraction was in the range of 30% to 35%. Severe   hypokinesis of the anteroseptal, anterior, inferoseptal, and   apical myocardium. - Mitral valve: Mildly to moderately calcified annulus. Mobility   was moderately restricted. There was mild to moderate   regurgitation. - Left atrium: The atrium was severely dilated. - Right ventricle: The cavity size was moderately dilated. Wall   thickness was normal. Systolic function was moderately reduced. - Right atrium: The atrium was moderately to severely dilated. - Pulmonary arteries: Systolic pressure was moderately to severely   increased,estimated at approximately 45 mmHg. - Pericardium, extracardiac: A trivial pericardial effusion was   identified.  Patient Profile     62 y.o. female with a hx of breast cancer treated in 2010 w/ lumpectomy (no radiation), DLD, HTN, h/o seizures, GERD, depression and no prior cardiac history, who is being seen today for the evaluation of new atrial fibrillation, in the setting of acute GI illness.   Converted to NSR overnight, dilt off. Baseline cognitive impairment.  Assessment & Plan    1. New onset atrial fibrillation with RVR - was on cardizem drip running at 6 mg/hr - pt converted to NSR overnight, cardizem off at 3AM - sinus rhythm with HR in the rates in the 90-100s, first degree heart block - pressures 120-140s, were marginal yesteray - This patients CHA2DS2-VASc Score and unadjusted Ischemic Stroke Rate (% per year) is equal to 3.2 % stroke rate/year from a score of 3 (HTN, female, CHF) - holding off on anticoagulation for now until liver function and anemia improve - may need outpatient event monitor as this Afib was likely the result of her GI illness vs  anemia - Hb 9.4 today (10.1)   2. New onset systolic heart failure - EF 30-35% with hypokinesis of the anteroseptal, anterior, inferoseptal, and apical myocardium - severely dilated left and right atria -PAS 45 - not on a diuretic regimen, will give 1 dose of Lasix IV 20 mg and follow BMET - will likely need a right and left heart cath - unclear duration of Afib as a contributing factor  3. GI illness, abnormal LFTs - per GI and IM   For questions or updates, please contact [place] Please consult [email or web address] for contact info under Cardiology/STEMI.      Signed, Rosaria Ferries, PA-C  06/25/2018, 7:56 AM     Attending Note:   The patient was seen and examined.  Agree with  assessment and plan as noted above.  Changes made to the above note as needed.  Patient seen and independently examined with Rosaria Ferries, PA .   We discussed all aspects of the encounter. I agree with the assessment and plan as stated above.   1.  Acute on chronic systolic and diastolic congestive heart failure: The patient has an ejection fraction of 30 to 35%.  She has segmental wall motion of normalities.  Still has altered mental status hesitate to recommend heart catheterization at this point although I think that that would be indicated at some point.  He is currently on lisinopril 5 mg a day.  She is getting Lasix 20 mg IV a day. Metoprolol 25 mg twice a day Continue medical therapy now for now.  She is not having any angina.  Will consider heart catheterization when she is neurologically more alert and awake.  I talked to her husband on his mobile phone.  He confirms that she is much more awake when she is at home.  She does not get any regular exercise.  She does have some shortness of breath with exercise but has never had episodes of chest discomfort.  2.  Paroxysmal atrial fibrillation: She is back in sinus rhythm.  Consider starting Eliquis when she is more medically stable.  Not  currently on anticoagulation.   I have spent a total of 40 minutes with patient reviewing hospital  notes , telemetry, EKGs, labs and examining patient as well as establishing an assessment and plan that was discussed with the patient. > 50% of time was spent in direct patient care.   Thayer Headings, Brooke Bonito., MD, [facility] 06/25/2018, 12:34 PM [address],  [place] Pager [phone]

## 2018-06-25 NOTE — Progress Notes (Signed)
   06/25/18 1200  PT Evaluation Visit Information  Last PT Received On 06/25/18  Assistance Needed +2 safety  PT/OT/SLP Co-Evaluation/Treatment Yes  Reason for Co-Treatment For patient/therapist safety  PT goals addressed during session Mobility/safety with mobility  History of Present Illness This 62 year old female was admitted for nausea and palpitations.  Admitted with AFib with RVR/acute encephalopathy.  PMH: seizures, depression, schizophrenia, h/o breast CA and HTN  Precautions  Precautions Fall  Pain Assessment  Pain Assessment No/denies pain  Cognition  Arousal/Alertness Awake/alert  General Comments delayed processing, oriented only to self and place.  Multimodal cues needed, stated  the date was 2020,   Upper Extremity Assessment  Upper Extremity Assessment Defer to OT evaluation  Lower Extremity Assessment  Lower Extremity Assessment Generalized weakness  Cervical / Trunk Assessment  Cervical / Trunk Assessment Normal  Bed Mobility  General bed mobility comments min A for OOB  Transfers  General transfer comment Assist to rise and steady.  +2 for lines/safety  Ambulation/Gait  Ambulation/Gait assistance Min assist;+2 safety/equipment  Gait Distance (Feet) 25 Feet  Assistive device Rolling walker (2 wheeled)  Gait Pattern/deviations Step-to pattern;Step-through pattern;Shuffle;Narrow base of support  General Gait Details required assistance to guide RW, steady assist  throughout. HR  in 90's and sats WFL  PT - End of Session  Equipment Utilized During Treatment Gait belt  Activity Tolerance Patient tolerated treatment well  Patient left in chair;with call bell/phone within reach;with chair alarm set  Nurse Communication Mobility status  PT Assessment  PT Recommendation/Assessment Patient needs continued PT services  PT Visit Diagnosis Unsteadiness on feet (R26.81)  PT Problem List Decreased strength;Decreased activity tolerance;Decreased mobility;Decreased  cognition;Decreased knowledge of use of DME  Barriers to Discharge Decreased caregiver support  PT Plan  PT Frequency (ACUTE ONLY) Min 2X/week  PT Treatment/Interventions (ACUTE ONLY) DME instruction;Gait training;Functional mobility training;Therapeutic activities;Therapeutic exercise  AM-PAC PT "6 Clicks" Daily Activity Outcome Measure  Difficulty turning over in bed (including adjusting bedclothes, sheets and blankets)? 3  Difficulty moving from lying on back to sitting on the side of the bed?  3  Difficulty sitting down on and standing up from a chair with arms (e.g., wheelchair, bedside commode, etc,.)? 2  Help needed moving to and from a bed to chair (including a wheelchair)? 2  Help needed walking in hospital room? 2  Help needed climbing 3-5 steps with a railing?  1  6 Click Score 13  Mobility G Code  CK  PT Recommendation  Follow Up Recommendations SNF  PT equipment None recommended by PT  Acute Rehab PT Goals  Patient Stated Goal none stated  PT Goal Formulation Patient unable to participate in goal setting  Time For Goal Achievement 07/09/18  Potential to Achieve Goals Good  PT Time Calculation  PT Start Time (ACUTE ONLY) 0740  PT Stop Time (ACUTE ONLY) 0803  PT Time Calculation (min) (ACUTE ONLY) 23 min  PT General Charges  $$ ACUTE PT VISIT 1 Visit  PT Evaluation  $PT Eval Low Complexity 1 Low  [place] PT [phone]

## 2018-06-25 NOTE — Progress Notes (Signed)
PROGRESS NOTE  Brandi [name] FGH:[id number] DOB: Jan 24, 1956 DOA: 06/23/2018 PCP: Denita Lung, MD  HPI/Recap of past 78 hours: 62 year old female with medical history significant for hypertension, hyperlipidemia, depression, seizures, left breast cancer status post lumpectomy in 2009, GERD presents to the ED complaining of generalized abdominal pain, nausea/vomiting, weakness, decreased appetite for the past few days.  Of note, patient saw PCP who noted elevated LFTs and ordered a CT abdomen pelvis which was unremarkable except for possible cirrhosis.  In the ED, right upper quadrant ultrasound done showed possible cirrhosis.  Patient was noted to have A. fib with RVR of which she was started on Cardizem drip and admitted to the stepdown unit.  Cardiology consulted, 2D echo showed decreased EF of 30 to 35% with hypokinesis.  Patient admitted for further management.  Today, patient noted to be slightly more awake, very slow to follow commands. Was able to move all extremities after several attempts, not fully oriented. Patient denied any worsening abdominal pain, nausea/vomiting/diarrhea, fever/chills, chest pain.  Assessment/Plan: Principal Problem:   Atrial fibrillation with RVR (HCC) Active Problems:   Major depression   Migraine headache   GERD (gastroesophageal reflux disease)   History of breast cancer in female   Hyperlipidemia associated with type 2 diabetes mellitus (HCC)   Leukocytosis   Type 2 diabetes mellitus without complication, without long-term current use of insulin (HCC)   Hypertension associated with diabetes (HCC)   Anemia   Elevated LFTs   Malaise   Schizophrenia (HCC)   History of seizures   Nausea and vomiting  Acute infarct right frontal operculum Patient very slow to follow commands, sleepy Able to move all extremities, not fully oriented MRI showed acute infarct right frontal operculum, likely embolic due to ongoing A. Fib, newly diagnosed Neurology,  Dr. Leonel Ramsay consulted, recommended transfer to [facility], hold off any [facility] until he sees patient LDL 28, A1c 6.6 N.p.o., hold off any BP meds, permissive hypertension PT/OT/SLP once at [facility]  Acute metabolic encephalopathy Likely due to above,??hepatic Vs med-induced (on multiple sedating meds including latuda with newly diagnosed cirrhosis) Afebrile, with resolved leukocytosis No signs/symptoms of infection/sepsis Elevated LFTs, with T bili 1.4, ammonia WNL BC x2 NGTD, UC pending collection UDS negative CT abdomen/pelvis, RUQ USS showed possible cirrhosis CT head with no acute intracranial abnormalities, MRI as above Adjusted sedating meds (reduced ativan to prn bedtime), consider reducing/holding off Latuda NPO  Abnormal LFTs/nausea/vomiting Likely due to ?cirrhosis, new onset Afebrile, resolved leukocytosis Tylenol, salicylate levels negative, lipase normal, INR elevated Hepatitis panel negative CT abdomen/pelvis, RUQ USS showed possible cirrhosis CT renal stone showed bilateral nonobstructing renal stones. Colonic diverticulosis without diverticulitis Supportive management GI Dr Benson Norway consulted, appreciate recs Daily CMP  New onset paroxysmal A. Fib Currently in NSR, tachycardic s/p Cardizem drip Chest x-ray showed cardiomegaly with small effusions TSH/free T4 within normal limits 2D echo showed EF of 30 to 35%, severe hypokinesis of the anteroseptal, anterior, inferoseptal and apical myocardium, moderate pulmonary hypertension CHA2DS2-VASc: 3, holding off on AC until neurology see pt Cardiology on board: Started Lopressor twice daily  Newly diagnosed combined systolic/diastolic HF BNP 678 2D echo showed EF of 30 to 35%, severe hypokinesis of the anteroseptal, anterior, inferoseptal and apical myocardium, moderate pulmonary hypertension Cardiology on board: Plan for left heart catheterization at a later time. IV lasix with caution Monitor closely  Anemia on  chronic disease/microcytic/iron def Baseline hgb 9-10 No signs of bleeding, FOBT pending Iron panel showing iron def anemia,  iron 14, sats 4, ferritin 9, consider IV iron once more stable Daily CBC  Hypertension Allow permissive HTN Held lisinopril  Hyperlipidemia LDL 28, at goal Hold Lipitor for now due to abnormal LFTs  Type II DM Last A1c 6.6 Sensitive sliding scale   GERD Continue famotidine  History of seizures Continue Keppra, Vimpat  Anxiety/depression Continue Ativan as needed, Buspar, Effexor, consider decreasing/stopping Latuda     Code Status: Full  Family Communication: None at bedside  Disposition Plan: Once work-up complete   Consultants:  Cardiology  Neurology  GI  Procedures:  None  Antimicrobials:  None  DVT prophylaxis: SCDs   Objective: Vitals:   06/25/18 1316 06/25/18 1318 06/25/18 1320 06/25/18 1335  BP: (!) 87/62 (!) 77/58 (!) 96/57 (!) 89/53  Pulse:   84 81  Resp: (!) 24 (!) 23 (!) 25 20  Temp:      TempSrc:      SpO2:   98% 99%  Weight:      Height:        Intake/Output Summary (Last 24 hours) at 06/25/2018 1521 Last data filed at 06/25/2018 1131 Gross per 24 hour  Intake 50 ml  Output -  Net 50 ml   Filed Weights   06/23/18 0922 06/24/18 0500 06/25/18 0500  Weight: 74.6 kg (164 lb 7.4 oz) 73.8 kg (162 lb 11.2 oz) 77.8 kg (171 lb 8.3 oz)    Exam:   General: Sleepy, lethargic  Cardiovascular: S1, S2 present  Respiratory: CTA B  Abdomen: Soft, nontender, nondistended, bowel sounds present  Musculoskeletal: No pedal edema bilaterally  Skin: Normal  Psychiatry: Unable to assess   Data Reviewed: CBC: Recent Labs  Lab 06/20/18 1159 06/22/18 1350 06/23/18 0119 06/24/18 0312 06/25/18 0539  WBC 8.7 15.1* 12.1* 10.1 9.9  NEUTROABS 6.5 11.5*  --   --  7.2  HGB 10.5* 9.9* 10.1* 9.4* 9.4*  HCT 36.5 32.8* 35.1* 32.9* 33.4*  MCV 66.5* 64* 66.7* 67.4* 67.7*  PLT 556* 555* 600* 544* 558*   Basic  Metabolic Panel: Recent Labs  Lab 06/20/18 1159 06/22/18 1350 06/23/18 0119 06/23/18 0432 06/24/18 0312 06/25/18 0335  NA 138 138 137  --  139 142  K 3.5 4.4 3.2*  --  3.5 4.1  CL 100 99 98  --  107 110  CO2 26 30* 27  --  24 24  GLUCOSE 159* 139* 134*  --  130* 136*  BUN 16 19 18   --  12 12  CREATININE 0.83 0.89 0.87  --  0.75 0.83  CALCIUM 9.0 9.7 9.0  --  8.3* 8.9  MG  --   --   --  1.8  --   --    GFR: Estimated Creatinine Clearance: 64.8 mL/min (by C-G formula based on SCr of 0.83 mg/dL). Liver Function Tests: Recent Labs  Lab 06/20/18 1159 06/22/18 1350 06/23/18 0119 06/24/18 0312 06/25/18 0335  AST 51* 35 40 47* 51*  ALT 163* 115* 101* 89* 80*  ALKPHOS 267* 307* 249* 223* 258*  BILITOT 1.3* 1.1 1.1 1.4* 1.0  PROT 6.0* 5.9* 6.4* 5.7* 5.6*  ALBUMIN 3.3* 3.8 3.5 3.2* 3.2*   Recent Labs  Lab 06/20/18 1159 06/23/18 0119  LIPASE 35 37   Recent Labs  Lab 06/24/18 1225  AMMONIA 22   Coagulation Profile: Recent Labs  Lab 06/20/18 1159 06/23/18 0432 06/24/18 0312 06/25/18 0335  INR 1.28 1.34 1.37 1.35   Cardiac Enzymes: No results for input(s): CKTOTAL, CKMB, CKMBINDEX,  TROPONINI in the last 168 hours. BNP (last 3 results) No results for input(s): PROBNP in the last 8760 hours. HbA1C: No results for input(s): HGBA1C in the last 72 hours. CBG: Recent Labs  Lab 06/24/18 1217 06/24/18 1629 06/24/18 2142 06/25/18 0746 06/25/18 1140  GLUCAP 134* 147* 111* 118* 141*   Lipid Profile: Recent Labs    06/25/18 0335  CHOL 76  HDL 33*  LDLCALC 28  TRIG 74  CHOLHDL 2.3   Thyroid Function Tests: Recent Labs    06/23/18 0929  TSH 0.532  FREET4 0.94   Anemia Panel: Recent Labs    06/25/18 0335  FERRITIN 9*  TIBC 375  IRON 14*   Urine analysis:    Component Value Date/Time   COLORURINE YELLOW 06/23/2018 [place] 06/23/2018 0434   APPEARANCEUR Cloudy 06/20/2013 1745   LABSPEC 1.014 06/23/2018 0434   LABSPEC 1.012  06/20/2013 1745   PHURINE 6.0 06/23/2018 0434   GLUCOSEU NEGATIVE 06/23/2018 0434   GLUCOSEU Negative 06/20/2013 1745   HGBUR NEGATIVE 06/23/2018 0434   BILIRUBINUR NEGATIVE 06/23/2018 0434   BILIRUBINUR n 05/17/2015 1425   BILIRUBINUR Negative 06/20/2013 1745   KETONESUR NEGATIVE 06/23/2018 0434   PROTEINUR NEGATIVE 06/23/2018 0434   UROBILINOGEN negative 05/17/2015 1425   UROBILINOGEN 0.2 06/29/2013 1530   NITRITE NEGATIVE 06/23/2018 0434   LEUKOCYTESUR NEGATIVE 06/23/2018 0434   LEUKOCYTESUR 3+ 06/20/2013 1745   Sepsis Labs: @LABRCNTIP (procalcitonin:4,lacticidven:4)  ) Recent Results (from the past 240 hour(s))  MRSA PCR Screening     Status: None   Collection Time: 06/23/18 10:33 AM  Result Value Ref Range Status   MRSA by PCR NEGATIVE NEGATIVE Final    Comment:        The GeneXpert MRSA Assay (FDA approved for NASAL specimens only), is one component of a comprehensive MRSA colonization surveillance program. It is not intended to diagnose MRSA infection nor to guide or monitor treatment for MRSA infections. Performed at [facility], [place] [address] [place], [place] [address]   Culture, blood (routine x 2)     Status: None (Preliminary result)   Collection Time: 06/24/18 12:56 PM  Result Value Ref Range Status   Specimen Description   Final    BLOOD LEFT ANTECUBITAL Performed at [place] [address], [place], [place] [address]    Special Requests   Final    BOTTLES DRAWN AEROBIC ONLY Blood Culture adequate volume Performed at [facility] [address], [place], [place] [address]    Culture   Final    NO GROWTH < 24 HOURS Performed at [facility] [address] [place], [place] [address]    Report Status PENDING  Incomplete  Culture, blood (routine x 2)     Status: None (Preliminary result)   Collection Time: 06/24/18  1:03 PM  Result Value Ref Range Status   Specimen Description    Final    BLOOD LEFT HAND Performed at [place] [address] [place], [place] [address]    Special Requests   Final    BOTTLES DRAWN AEROBIC ONLY Blood Culture results may not be optimal due to an inadequate volume of blood received in culture bottles Performed at [facility] [address] [place], [place] [address]    Culture   Final    NO GROWTH < 24 HOURS Performed at [facility] [address], [place],  [address]  Report Status PENDING  Incomplete      Studies: Mr Brain Wo Contrast  Result Date: 06/25/2018 CLINICAL DATA:  Encephalopathy.  Recent mental status change. EXAM: MRI HEAD WITHOUT CONTRAST TECHNIQUE: Multiplanar, multiecho pulse sequences of the brain and surrounding structures were obtained without intravenous contrast. COMPARISON:  CT head 06/23/2018 FINDINGS: Brain: Acute infarct right frontal lobe involving the frontal operculum. No other acute infarct. Negative for hemorrhage or mass Frontal and temporal lobe atrophy with volume loss and ventricular enlargement. Atrophy has progressed since the prior MRI of 07/14/2009 Vascular: Normal arterial flow void Skull and upper cervical spine: Negative Sinuses/Orbits: Paranasal sinuses clear. Bilateral cataract surgery. Other: None IMPRESSION: Acute infarct right frontal operculum Progression of atrophy since 2010. Atrophy most prominent in the frontal and temporal lobes bilaterally. Electronically Signed   By: Franchot Gallo M.D.   On: 06/25/2018 14:26    Scheduled Meds: . aspirin EC  81 mg Oral Daily  . busPIRone  15 mg Oral Daily  . busPIRone  30 mg Oral QHS  . insulin aspart  0-9 Units Subcutaneous TID WC  . lacosamide  100 mg Oral BID  . levETIRAcetam  1,000 mg Oral BID  . lisinopril  5 mg Oral Daily  . lurasidone  120 mg Oral Daily  . metoprolol tartrate  25 mg Oral BID  . venlafaxine XR  300 mg Oral Daily    Continuous Infusions: . famotidine  (PEPCID) IV Stopped (06/25/18 1131)     LOS: 2 days     Alma Friendly, MD [facility]   If 7PM-7AM, please contact night-coverage [email or web address] Password [email or web address] 06/25/2018, 3:21 PM

## 2018-06-25 NOTE — Evaluation (Signed)
Occupational Therapy Evaluation Patient Details Name: Brandi [name] MRN: [id number] DOB: [birth year] Today's Date: 06/25/2018    History of Present Illness This 62 year old female was admitted for nausea and palpitations.  Admitted with AFib with RVR/acute encephalopathy.  PMH: seizures, depression, schizophrenia, h/o breast CA and HTN   Clinical Impression   Pt was admitted for the above. No family available; unsure of PLOF.  Pt had difficulty answering questions, decreased attention, delayed processing and was not oriented to time or situation. Will follow in acute setting with the goals listed below    Follow Up Recommendations  SNF;Supervision/Assistance - 24 hour    Equipment Recommendations  3 in 1 bedside commode    Recommendations for Other Services       Precautions / Restrictions Precautions Precautions: Fall Restrictions Weight Bearing Restrictions: No      Mobility Bed Mobility               General bed mobility comments: min A for OOB  Transfers Overall transfer level: Needs assistance Equipment used: Rolling walker (2 wheeled) Transfers: Sit to/from Stand Sit to Stand: Min assist         General transfer comment: Assist to rise and steady.  +2 for lines/safety    Balance Overall balance assessment: Needs assistance           Standing balance-Leahy Scale: Fair                             ADL either performed or assessed with clinical judgement   ADL Overall ADL's : Needs assistance/impaired Eating/Feeding: Set up;Supervision/ safety;Sitting   Grooming: Moderate assistance;Sitting   Upper Body Bathing: Maximal assistance   Lower Body Bathing: Maximal assistance;Sit to/from stand   Upper Body Dressing : Maximal assistance   Lower Body Dressing: Total assistance;Sit to/from stand   Toilet Transfer: Minimal assistance;+2 for safety/equipment;RW(chair)             General ADL Comments: ADLs affected by cognition.   Decreased initiation and follow through     Vision   Additional Comments: stared straight ahead. Did not make eye contact     Perception     Praxis      Pertinent Vitals/Pain       Hand Dominance     Extremity/Trunk Assessment Upper Extremity Assessment Upper Extremity Assessment: Generalized weakness(hands swollen)           Communication Communication Communication: No difficulties   Cognition Arousal/Alertness: Awake/alert Behavior During Therapy: WFL for tasks assessed/performed Overall Cognitive Status: No family/caregiver present to determine baseline cognitive functioning                                 General Comments: delayed processing, oriented only to self and place.  Multimodal cues needed   General Comments  vitals appear normal:  had poor wave length with pulse ox at times    Exercises     Shoulder Instructions      Home Living Family/patient expects to be discharged to:: Private residence Living Arrangements: Spouse/significant other Available Help at Discharge: Family Type of Home: House                           Additional Comments: pt had difficulty with home questions      Prior Functioning/Environment  Comments: unsure.  Pt reports she did not use AD        OT Problem List: Decreased strength;Decreased activity tolerance;Impaired balance (sitting and/or standing);Decreased knowledge of use of DME or AE;Decreased cognition;Decreased safety awareness      OT Treatment/Interventions: Self-care/ADL training;DME and/or AE instruction;Patient/family education;Balance training;Therapeutic activities    OT Goals(Current goals can be found in the care plan section) Acute Rehab OT Goals Patient Stated Goal: none stated OT Goal Formulation: Patient unable to participate in goal setting Time For Goal Achievement: 07/09/18 Potential to Achieve Goals: Good ADL Goals Pt Will Perform Grooming: with  supervision;standing Pt Will Transfer to Toilet: with min guard assist;bedside commode;regular height toilet;ambulating Additional ADL Goal #1: pt will complete ADL with supervision and min cues Additional ADL Goal #2: pt will sustain attention to task for 5 minutes with one redirection  OT Frequency: Min 2X/week   Barriers to D/C:            Co-evaluation PT/OT/SLP Co-Evaluation/Treatment: Yes Reason for Co-Treatment: For patient/therapist safety PT goals addressed during session: Mobility/safety with mobility OT goals addressed during session: ADL's and self-care      AM-PAC PT "6 Clicks" Daily Activity     Outcome Measure Help from another person eating meals?: A Little Help from another person taking care of personal grooming?: A Lot Help from another person toileting, which includes using toliet, bedpan, or urinal?: A Lot Help from another person bathing (including washing, rinsing, drying)?: A Lot Help from another person to put on and taking off regular upper body clothing?: A Lot Help from another person to put on and taking off regular lower body clothing?: A Lot 6 Click Score: 13   End of Session    Activity Tolerance: Patient tolerated treatment well Patient left: in chair;with call bell/phone within reach;with chair alarm set  OT Visit Diagnosis: Muscle weakness (generalized) (M62.81)                Time: 9480-1655 OT Time Calculation (min): 24 min Charges:  OT General Charges $OT Visit: 1 Visit OT Evaluation $OT Eval Low Complexity: 1 Low G-Codes:     [place], OTR/L [phone] 06/25/2018  Derriana Oser 06/25/2018, 8:21 AM

## 2018-06-25 NOTE — Progress Notes (Signed)
Patient arrived on the unit via [place] at 61. Welcomed patient to the unit. Telemetry placed. Patient has no questions or concerns at this time. Wendee Copp

## 2018-06-25 NOTE — Consult Note (Signed)
Continue ASA for now Will need clearance by GI before starting Anticoagulation Stroke is not big and does not explain symptoms               Requesting Physician: Dr. Rushie Chestnut    Chief Complaint: Encephalopathy  History obtained from: Patient and Chart    HPI:                                                                                                                                       Brandi [name] is an 62 y.o. female past medical history ofhypertension, hyperlipidemia,  diabetes mellitus, seizures,breast cancer who presents [facility] on July 9 with nausea vomiting and abdominal pain.  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 fib with RVR and started on Cardizem drip.  So noted to have systolic heart failure with ejection fraction of 30% . During admission patient subsequently became more confused and therefore an MRI brain was ordered.  MRI brain showed a right frontal opercular stroke.  No other infarcts noted to explain patient's confusion.  Patient started on lactulose for suspected hepatic encephalopathy.  Ammonia was 22 on July 10 however.  Neurology was consulted for further stroke evaluation.  Patient currently on aspirin.  GI involved for management of cirrhosis.  Colonoscopy being considered as patient is also anemic with a recent hemoglobin dropped from 13 to about 11. No obvious GI bleeding.  Date last known well: Unclear, 06/23/18 tPA Given: No outside window MRS prior to admission :0    Past Medical History:  Diagnosis Date  . Allergic rhinitis   . Anemia   . Arthritis HX RIGHT ANKLE FX  . Cancer [facility])    breast, lumpectomy  . Depression    MAJOR  . Dyslipidemia   . GERD (gastroesophageal reflux disease)   . History of breast cancer DX 2010--  S/P LUMPECTOMY AND RADIATION -- NO RECURRENCE   ONCOLOGIST- DR Truddie Coco  . Hypertension   . Left flank pain   . Left ureteral calculus   . Migraines    . Schizophrenic disorder ([place])   . Tonic-clonic seizure disorder ([place]) X1  2010--  NO SEIZURE SINCE  . Type II or unspecified type diabetes mellitus without mention of complication, not stated as uncontrolled 03/16/2014    Past Surgical History:  Procedure Laterality Date  . BREAST SURGERY  01-04-2009  DR Margot Chimes   LEFT BREAST LUMPECTOMY W/ SLN DISSECTION  FOR CANCER  . CATARACT EXTRACTION, BILATERAL Bilateral   . CESAREAN SECTION  1998  . CHILD BIRTH     C-SECTION  . CYSTOSCOPY WITH RETROGRADE PYELOGRAM, URETEROSCOPY AND STENT PLACEMENT  11/10/2012   Procedure: CYSTOSCOPY WITH RETROGRADE PYELOGRAM, URETEROSCOPY AND STENT PLACEMENT;  Surgeon: Hanley Ben, MD;  Location: [place];  Service: Urology;  Laterality: Left;  WITH DOUBLE J STENT  . HOLMIUM LASER  APPLICATION  17/00/1749   Procedure: HOLMIUM LASER APPLICATION;  Surgeon: Hanley Ben, MD;  Location[facility] [place];  Service: Urology;  Laterality: Left;  . LAPAROSCOPIC CHOLECYSTECTOMY  1990'S    Family History  Problem Relation Age of Onset  . Cancer Sister        breast  . Cancer Mother        breast  . Cancer Father        lung  . Mental illness Maternal Uncle   . Cancer Maternal Grandmother   . Stroke Maternal Grandmother   . Tuberculosis Maternal Grandfather   . Cancer Paternal Grandmother   . Seizures Neg Hx    Social History:  reports that she has never smoked. She has never used smokeless tobacco. She reports that she does not drink alcohol or use drugs.  Allergies:  Allergies  Allergen Reactions  . Penicillins Rash    Has patient had a PCN reaction causing immediate rash, facial/tongue/throat swelling, SOB or lightheadedness with hypotension: No Has patient had a PCN reaction causing severe rash involving mucus membranes or skin necrosis: No Has patient had a PCN reaction that required hospitalization No Has patient had a PCN reaction occurring within the last 10 years:  No If all of the above answers are "NO", then may proceed with Cephalosporin use.     Medications:                                                                                                                        I reviewed home medications.  On aspirin   ROS:                                                                                                                                     14 systems reviewed and negative except above .   Examination:  General: Appears pale  Psych: Patient confused Eyes: No scleral injection HENT: No OP obstrucion Head: Normocephalic.  Cardiovascular: Normal rate and regular rhythm.  Respiratory: Effort normal and breath sounds normal to anterior ascultation GI: Soft.  No distension. There is no tenderness.  Skin: WDI    Neurological Examination Mental Status: Alert, however confused.  Can state her name but does not date her date of birth, month or year or location.  Does not know why she is in the hospital.  Perseverates.  Follows commands only intermittently Cranial Nerves: II: Visual fields : Unable to assess as patient was not able to count accurately, blink to threat bilaterally.  No obvious neglect III,IV, VI: ptosis not present, extra-ocular motions intact bilaterally, pupils equal, round, reactive to light and accommodation V,VII: smile symmetric, facial light touch sensation normal bilaterally VIII: Patient is here normally on both sides IX,X: uvula rises symmetrically XI: bilateral shoulder shrug XII: midline tongue extension Motor: Right : Upper extremity   5/5    Left:     Upper extremity   5/5  Lower extremity   5/5     Lower extremity   5/5 Tone and bulk:normal tone throughout; no atrophy noted Sensory: Pinprick and light touch intact throughout, bilaterally Deep Tendon Reflexes: 2+ and symmetric  throughout Plantars: Right: downgoing   Left: downgoing Cerebellar: normal finger-to-nose, normal rapid alternating movements and normal heel-to-shin test.  Asterixis noted on exam. Gait: Not assessed gait     Lab Results: Basic Metabolic Panel: Recent Labs  Lab 06/20/18 1159 06/22/18 1350 06/23/18 0119 06/23/18 0432 06/24/18 0312 06/25/18 0335  NA 138 138 137  --  139 142  K 3.5 4.4 3.2*  --  3.5 4.1  CL 100 99 98  --  107 110  CO2 26 30* 27  --  24 24  GLUCOSE 159* 139* 134*  --  130* 136*  BUN 16 19 18   --  12 12  CREATININE 0.83 0.89 0.87  --  0.75 0.83  CALCIUM 9.0 9.7 9.0  --  8.3* 8.9  MG  --   --   --  1.8  --   --     CBC: Recent Labs  Lab 06/20/18 1159 06/22/18 1350 06/23/18 0119 06/24/18 0312 06/25/18 0539  WBC 8.7 15.1* 12.1* 10.1 9.9  NEUTROABS 6.5 11.5*  --   --  7.2  HGB 10.5* 9.9* 10.1* 9.4* 9.4*  HCT 36.5 32.8* 35.1* 32.9* 33.4*  MCV 66.5* 64* 66.7* 67.4* 67.7*  PLT 556* 555* 600* 544* 558*    Coagulation Studies: Recent Labs    06/23/18 0432 06/24/18 0312 06/25/18 0335  LABPROT 16.5* 16.7* 16.5*  INR 1.34 1.37 1.35    Imaging: Mr Brain Wo Contrast  Result Date: 06/25/2018 CLINICAL DATA:  Encephalopathy.  Recent mental status change. EXAM: MRI HEAD WITHOUT CONTRAST TECHNIQUE: Multiplanar, multiecho pulse sequences of the brain and surrounding structures were obtained without intravenous contrast. COMPARISON:  CT head 06/23/2018 FINDINGS: Brain: Acute infarct right frontal lobe involving the frontal operculum. No other acute infarct. Negative for hemorrhage or mass Frontal and temporal lobe atrophy with volume loss and ventricular enlargement. Atrophy has progressed since the prior MRI of 07/14/2009 Vascular: Normal arterial flow void Skull and upper cervical spine: Negative Sinuses/Orbits: Paranasal sinuses clear. Bilateral cataract surgery. Other: None IMPRESSION: Acute infarct right frontal operculum Progression of atrophy since 2010.  Atrophy most prominent in the frontal and temporal lobes bilaterally. Electronically Signed   By: Franchot Gallo  M.D.   On: 06/25/2018 14:26     ASSESSMENT AND PLAN   62 year old female with new diagnosis of cirrhosis, new onset atrial fibrillation, new diagnosis of heart failure answered to [facility] after MRI shows right frontal lobe stroke this is likely not explanation for encephalopathy  and due to atrial fibrillation.   #Acute encephalopathy -  given.  Patient has asterixis I feel this is hepatic encephalopathy despite significant high ammonia values.  Agree with lactulose.    #Left frontal lobe embolic infarct -Likely due to atrial fibrillation -Continue aspirin for now. Patient will need anticoagulation however will need clearance from GI before starting anticoagulation.  While patient's with cirrhosis are at high risk for bleeding, anticoagulation in the setting of A. fib is still recommended. #MRA Head and neck  #Transthoracic Echo  #Start or continue Atorvastatin 40 mg/other high intensity statin if tolerated # BP goal: permissive HTN upto 546 systolic ( lower BP goals due to heart failure) # HBAIC ( already done)  and Lipid profile # Telemetry monitoring # Frequent neuro checks # NPO until passes stroke swallow screen   #Remote history of seizure Continue Keppra and Vimpat at current home doses Switch to IV formulation as patient is currently n.p.o.  Please page stroke NP  Or  PA  Or MD from 8am -4 pm  as this patient from this time will be  followed by the stroke.   You can look them up on [email or web address]  Password [facility]      Sushanth Aroor [facility] Pager Number [phone]

## 2018-06-25 NOTE — Consult Note (Addendum)
Reason for Consult: Nausea, vomiting, lethargy, and abnormal liver enzymes. Referring Physician: [facility]  Tillman Abide HPI: This is a 62 year old female who was feeling poorly for approximately one week before seeing her PA, Dorothea Ogle on 06/13/2018.  On the day of evaluation in the office by Audelia Acton the patient's husband reported that she generally felt poorly and she was moaning and groaning all night.  Routine blood work was obtained and her liver enzymes were noted to be markedly elevated:  AST 302, ALT 611, and AP 380.  An EKG at that time showed a bifasicular block.  As a result of the findings a CT scan was ordered and she was noted to have the possibility of cirrhosis with a prominent caudate lobe and surface irregularity (06/16/2018).  Prior CT scans did show a fatty liver and she has a history of metabolic syndrome.  She presented to the ER on 06/20/2018 with complaints of worsening abdominal pain described as a band-like sensation across her mid abdomen, but her liver enzymes were improving.  There was no evidence of biliary ductal dilation on the most recent CT scan and this is in the setting of a cholecystectomy state.  She was sent home and she saw Dr. Redmond School the next day and he continued her supportive care.  Unfortunately she continued to have a worsening of her symptoms and she presented on 06/23/2018 with generalized weakness and nausea.  There were no reports of any fever, diarrhea, or constipation.  During this admission she was found to have new onset afib with RVR and she responded very well to IV cardizem with conversion to NSR.  Her EF was noted to be 30-35%.  Work also showed that she has a microcytic anemia with an iron saturation of 4%, ferritin of 9, and an MCV of 67.  Her HGB is mildly depressed from her baseline of 10-11, but in late 2017 and prior her HGB was higher in the 12-15 range.  Her last colonoscopy was on 11/08/2013 and it was an incomplete examination.  She was  not able to tolerate the prep and the proximal colon was poorly prepped.  Dr. Collene Mares did not recommend a repeat colonoscopy as a result of her inability to tolerate the prep and the long sinus pauses during the procedure.  Past Medical History:  Diagnosis Date  . Allergic rhinitis   . Anemia   . Arthritis HX RIGHT ANKLE FX  . Cancer [facility])    breast, lumpectomy  . Depression    MAJOR  . Dyslipidemia   . GERD (gastroesophageal reflux disease)   . History of breast cancer DX 2010--  S/P LUMPECTOMY AND RADIATION -- NO RECURRENCE   ONCOLOGIST- DR Truddie Coco  . Hypertension   . Left flank pain   . Left ureteral calculus   . Migraines   . Schizophrenic disorder ([place])   . Tonic-clonic seizure disorder ([place]) X1  2010--  NO SEIZURE SINCE  . Type II or unspecified type diabetes mellitus without mention of complication, not stated as uncontrolled 03/16/2014    Past Surgical History:  Procedure Laterality Date  . BREAST SURGERY  01-04-2009  DR Margot Chimes   LEFT BREAST LUMPECTOMY W/ SLN DISSECTION  FOR CANCER  . CATARACT EXTRACTION, BILATERAL Bilateral   . CESAREAN SECTION  1998  . CHILD BIRTH     C-SECTION  . CYSTOSCOPY WITH RETROGRADE PYELOGRAM, URETEROSCOPY AND STENT PLACEMENT  11/10/2012   Procedure: CYSTOSCOPY WITH RETROGRADE PYELOGRAM, URETEROSCOPY AND STENT PLACEMENT;  Surgeon: Hanley Ben, MD;  Location: [facility];  Service: Urology;  Laterality: Left;  WITH DOUBLE J STENT  . HOLMIUM LASER APPLICATION  97/98/9211   Procedure: HOLMIUM LASER APPLICATION;  Surgeon: Hanley Ben, MD;  Location: [place];  Service: Urology;  Laterality: Left;  . LAPAROSCOPIC CHOLECYSTECTOMY  1990'S    Family History  Problem Relation Age of Onset  . Cancer Sister        breast  . Cancer Mother        breast  . Cancer Father        lung  . Mental illness Maternal Uncle   . Cancer Maternal Grandmother   . Stroke Maternal Grandmother   . Tuberculosis Maternal  Grandfather   . Cancer Paternal Grandmother   . Seizures Neg Hx     Social History:  reports that she has never smoked. She has never used smokeless tobacco. She reports that she does not drink alcohol or use drugs.  Allergies:  Allergies  Allergen Reactions  . Penicillins Rash    Has patient had a PCN reaction causing immediate rash, facial/tongue/throat swelling, SOB or lightheadedness with hypotension: No Has patient had a PCN reaction causing severe rash involving mucus membranes or skin necrosis: No Has patient had a PCN reaction that required hospitalization No Has patient had a PCN reaction occurring within the last 10 years: No If all of the above answers are "NO", then may proceed with Cephalosporin use.     Medications:  Scheduled: . aspirin EC  81 mg Oral Daily  . busPIRone  15 mg Oral Daily  . busPIRone  30 mg Oral QHS  . insulin aspart  0-9 Units Subcutaneous TID WC  . lacosamide  100 mg Oral BID  . levETIRAcetam  1,000 mg Oral BID  . lurasidone  120 mg Oral Daily  . metoprolol tartrate  25 mg Oral BID  . venlafaxine XR  300 mg Oral Daily   Continuous: . famotidine (PEPCID) IV Stopped (06/25/18 1131)    Results for orders placed or performed during the hospital encounter of 06/23/18 (from the past 24 hour(s))  Glucose, capillary     Status: Abnormal   Collection Time: 06/24/18  4:29 PM  Result Value Ref Range   Glucose-Capillary 147 (H) 70 - 99 mg/dL   Comment 1 Notify RN    Comment 2 Document in Chart   Glucose, capillary     Status: Abnormal   Collection Time: 06/24/18  9:42 PM  Result Value Ref Range   Glucose-Capillary 111 (H) 70 - 99 mg/dL  Iron and TIBC     Status: Abnormal   Collection Time: 06/25/18  3:35 AM  Result Value Ref Range   Iron 14 (L) 28 - 170 ug/dL   TIBC 375 250 - 450 ug/dL   Saturation Ratios 4 (L) 10.4 - 31.8 %   UIBC 361 ug/dL  Ferritin     Status: Abnormal   Collection Time: 06/25/18  3:35 AM  Result Value Ref Range    Ferritin 9 (L) 11 - 307 ng/mL  Comprehensive metabolic panel     Status: Abnormal   Collection Time: 06/25/18  3:35 AM  Result Value Ref Range   Sodium 142 135 - 145 mmol/L   Potassium 4.1 3.5 - 5.1 mmol/L   Chloride 110 98 - 111 mmol/L   CO2 24 22 - 32 mmol/L   Glucose, Bld 136 (H) 70 - 99 mg/dL   BUN 12  8 - 23 mg/dL   Creatinine, Ser 0.83 0.44 - 1.00 mg/dL   Calcium 8.9 8.9 - 10.3 mg/dL   Total Protein 5.6 (L) 6.5 - 8.1 g/dL   Albumin 3.2 (L) 3.5 - 5.0 g/dL   AST 51 (H) 15 - 41 U/L   ALT 80 (H) 0 - 44 U/L   Alkaline Phosphatase 258 (H) 38 - 126 U/L   Total Bilirubin 1.0 0.3 - 1.2 mg/dL   GFR calc non Af Amer >60 >60 mL/min   GFR calc Af Amer >60 >60 mL/min   Anion gap 8 5 - 15  Protime-INR     Status: Abnormal   Collection Time: 06/25/18  3:35 AM  Result Value Ref Range   Prothrombin Time 16.5 (H) 11.4 - 15.2 seconds   INR 1.35   Lipid panel     Status: Abnormal   Collection Time: 06/25/18  3:35 AM  Result Value Ref Range   Cholesterol 76 0 - 200 mg/dL   Triglycerides 74 <150 mg/dL   HDL 33 (L) >40 mg/dL   Total CHOL/HDL Ratio 2.3 RATIO   VLDL 15 0 - 40 mg/dL   LDL Cholesterol 28 0 - 99 mg/dL  CBC with Differential/Platelet     Status: Abnormal   Collection Time: 06/25/18  5:39 AM  Result Value Ref Range   WBC 9.9 4.0 - 10.5 K/uL   RBC 4.93 3.87 - 5.11 MIL/uL   Hemoglobin 9.4 (L) 12.0 - 15.0 g/dL   HCT 33.4 (L) 36.0 - 46.0 %   MCV 67.7 (L) 78.0 - 100.0 fL   MCH 19.1 (L) 26.0 - 34.0 pg   MCHC 28.1 (L) 30.0 - 36.0 g/dL   RDW 19.8 (H) 11.5 - 15.5 %   Platelets 558 (H) 150 - 400 K/uL   Neutrophils Relative % 73 %   Neutro Abs 7.2 1.7 - 7.7 K/uL   Lymphocytes Relative 16 %   Lymphs Abs 1.6 0.7 - 4.0 K/uL   Monocytes Relative 8 %   Monocytes Absolute 0.8 0.1 - 1.0 K/uL   Eosinophils Relative 2 %   Eosinophils Absolute 0.2 0.0 - 0.7 K/uL   Basophils Relative 1 %   Basophils Absolute 0.1 0.0 - 0.1 K/uL   RBC Morphology TARGET CELLS   Glucose, capillary     Status:  Abnormal   Collection Time: 06/25/18  7:46 AM  Result Value Ref Range   Glucose-Capillary 118 (H) 70 - 99 mg/dL   Comment 1 Notify RN    Comment 2 Document in Chart   Glucose, capillary     Status: Abnormal   Collection Time: 06/25/18 11:40 AM  Result Value Ref Range   Glucose-Capillary 141 (H) 70 - 99 mg/dL   Comment 1 Notify RN    Comment 2 Document in Chart      Mr Brain Wo Contrast  Result Date: 06/25/2018 CLINICAL DATA:  Encephalopathy.  Recent mental status change. EXAM: MRI HEAD WITHOUT CONTRAST TECHNIQUE: Multiplanar, multiecho pulse sequences of the brain and surrounding structures were obtained without intravenous contrast. COMPARISON:  CT head 06/23/2018 FINDINGS: Brain: Acute infarct right frontal lobe involving the frontal operculum. No other acute infarct. Negative for hemorrhage or mass Frontal and temporal lobe atrophy with volume loss and ventricular enlargement. Atrophy has progressed since the prior MRI of 07/14/2009 Vascular: Normal arterial flow void Skull and upper cervical spine: Negative Sinuses/Orbits: Paranasal sinuses clear. Bilateral cataract surgery. Other: None IMPRESSION: Acute infarct right frontal operculum Progression of atrophy  since 2010. Atrophy most prominent in the frontal and temporal lobes bilaterally. Electronically Signed   By: Franchot Gallo M.D.   On: 06/25/2018 14:26    ROS:  As stated above in the HPI otherwise negative.  Blood pressure 104/72, pulse 84, temperature 97.6 F (36.4 C), temperature source Axillary, resp. rate (!) 21, height 5' (1.524 m), weight 77.8 kg (171 lb 8.3 oz), SpO2 96 %.    PE: Gen: NAD, arousable HEENT:  [place]/AT, EOMI Neck: Supple, no LAD Lungs: CTA Bilaterally CV: RRR ABM: Soft, NTND, +BS Ext: No C/C/E, + asterixis  Assessment/Plan: 1) Nausea and vomiting. 2) ABM pain. 3) Cirrhosis in the setting of metabolic syndrome with hepatic encephalopathy. 4) Probable ischemic liver. 5) New onset afib with RVR now in  NSR. 6) IDA.    This a complex case.  It is not clear if a GI issue initiated her afib or the afib was the source of the nausea and vomiting.  Afib does not explain the abdominal pain, but her liver enzyme elevation and the rapid improvement is consistent with shock liver.  Her AP is still elevated, but this is typically the last to resolved.  Her EF is at 30-35%.  JVP and hepatojugular reflux was not able to be assessed with her hepatic encephalopathy.  She is very drowsy, but she is able to respond to direct and quick questioning.  The patient denies any pain and her nausea/vomiting is better.  The physical examination is significant for asterixis.  Her cirrhosis is as a result of metabolic syndrome.  Further evaluation of her anemia will need to be pursued in the near future once her hepatic and cardiac status stabilizes.  Plan: 1) Lactulose to the point that it induces diarrhea. 2) Potential for using Xifaxan.  This is an expensive medication. 3) Continue with supportive care.  ADDENDUM:   An acute right frontal lobe infarct is identified and she will be transferred to [facility].  This can cause nausea and vomiting.  She will still proceed with lactulose as there is evidence of hepatic encephalopathy. Cledith Abdou D 06/25/2018, 4:04 PM

## 2018-06-25 NOTE — Progress Notes (Addendum)
ANTICOAGULATION CONSULT NOTE - Initial Consult  Pharmacy Consult for Heparin Indication: atrial fibrillation  Allergies  Allergen Reactions  . Penicillins Rash    Has patient had a PCN reaction causing immediate rash, facial/tongue/throat swelling, SOB or lightheadedness with hypotension: No Has patient had a PCN reaction causing severe rash involving mucus membranes or skin necrosis: No Has patient had a PCN reaction that required hospitalization No Has patient had a PCN reaction occurring within the last 10 years: No If all of the above answers are "NO", then may proceed with Cephalosporin use.     Patient Measurements: Height: 5' (152.4 cm) Weight: 171 lb 8.3 oz (77.8 kg) IBW/kg (Calculated) : 45.5 Heparin Dosing Weight: 62 kg  Vital Signs: Temp: 97.6 F (36.4 C) (07/11 1200) Temp Source: Axillary (07/11 1200) BP: 89/53 (07/11 1335) Pulse Rate: 81 (07/11 1335)  Labs: Recent Labs    06/23/18 0119 06/23/18 0432 06/24/18 0312 06/25/18 0335 06/25/18 0539  HGB 10.1*  --  9.4*  --  9.4*  HCT 35.1*  --  32.9*  --  33.4*  PLT 600*  --  544*  --  558*  LABPROT  --  16.5* 16.7* 16.5*  --   INR  --  1.34 1.37 1.35  --   CREATININE 0.87  --  0.75 0.83  --     Estimated Creatinine Clearance: 64.8 mL/min (by C-G formula based on SCr of 0.83 mg/dL).   Medical History: Past Medical History:  Diagnosis Date  . Allergic rhinitis   . Anemia   . Arthritis HX RIGHT ANKLE FX  . Cancer [facility])    breast, lumpectomy  . Depression    MAJOR  . Dyslipidemia   . GERD (gastroesophageal reflux disease)   . History of breast cancer DX 2010--  S/P LUMPECTOMY AND RADIATION -- NO RECURRENCE   ONCOLOGIST- DR Truddie Coco  . Hypertension   . Left flank pain   . Left ureteral calculus   . Migraines   . Schizophrenic disorder ([place])   . Tonic-clonic seizure disorder ([place]) X1  2010--  NO SEIZURE SINCE  . Type II or unspecified type diabetes mellitus without mention of complication, not stated  as uncontrolled 03/16/2014    Medications:  Scheduled:  . aspirin EC  81 mg Oral Daily  . busPIRone  15 mg Oral Daily  . busPIRone  30 mg Oral QHS  . insulin aspart  0-9 Units Subcutaneous TID WC  . lacosamide  100 mg Oral BID  . levETIRAcetam  1,000 mg Oral BID  . lisinopril  5 mg Oral Daily  . lurasidone  120 mg Oral Daily  . metoprolol tartrate  25 mg Oral BID  . venlafaxine XR  300 mg Oral Daily   Infusions:  . famotidine (PEPCID) IV Stopped (06/25/18 1131)    Assessment: 36 yoF admitted on 7/9 with nausea, weakness, ne onset CHF and also found to have new onset Afib, now converted to NSR.  Initially holding off anticoagulation d/t anemia and liver dysfunction (LFTs, Alk phos elevated).  Pharmacy is now consulted to dose Heparin IV.  PTA aspirin 81 mg.  No PTA anticoagulation.  Today, 06/25/2018: Baseline INR 1.34 CBC:  Hgb 9.4 remains low/stable.  Plt remain elevated at 558 SCr 0.83, CrCl ~ 65 ml/min 7/11 MRI brain pending  Goal of Therapy:  Heparin level 0.3-0.7 units/ml Monitor platelets by anticoagulation protocol: Yes   Plan:   Give heparin 3000 units bolus IV x 1  Start heparin IV infusion  at 900 units/hr  Heparin level 6 hours after starting  Daily heparin level and CBC  Continue to monitor H&H and platelets  Follow up plans for long-term anticoagulation.  Gretta Arab PharmD, BCPS Pager [phone] 06/25/2018 1:57 PM   Addendum: MRI brain results:  Acute infarct right frontal operculum Plan: D/C Heparin  (Bolus and drip were NOT started)  Gretta Arab PharmD, BCPS Pager [phone] 06/25/2018 3:08 PM

## 2018-06-26 ENCOUNTER — Inpatient Hospital Stay (HOSPITAL_COMMUNITY): Payer: Medicare Other

## 2018-06-26 DIAGNOSIS — R4182 Altered mental status, unspecified: Secondary | ICD-10-CM

## 2018-06-26 DIAGNOSIS — F209 Schizophrenia, unspecified: Secondary | ICD-10-CM

## 2018-06-26 DIAGNOSIS — I63411 Cerebral infarction due to embolism of right middle cerebral artery: Secondary | ICD-10-CM

## 2018-06-26 DIAGNOSIS — F323 Major depressive disorder, single episode, severe with psychotic features: Secondary | ICD-10-CM

## 2018-06-26 DIAGNOSIS — I6340 Cerebral infarction due to embolism of unspecified cerebral artery: Secondary | ICD-10-CM

## 2018-06-26 LAB — GLUCOSE, CAPILLARY
GLUCOSE-CAPILLARY: 135 mg/dL — AB (ref 70–99)
GLUCOSE-CAPILLARY: 110 mg/dL — AB (ref 70–99)
GLUCOSE-CAPILLARY: 149 mg/dL — AB (ref 70–99)
Glucose-Capillary: 111 mg/dL — ABNORMAL HIGH (ref 70–99)

## 2018-06-26 LAB — HEPARIN LEVEL (UNFRACTIONATED): Heparin Unfractionated: <0.1 IU/mL — ABNORMAL LOW (ref 0.30–0.70)

## 2018-06-26 LAB — AMMONIA: AMMONIA: 38 umol/L — AB (ref 9–35)

## 2018-06-26 MED ORDER — IOPAMIDOL (ISOVUE-370) INJECTION 76%
INTRAVENOUS | Status: AC
  Filled 2018-06-26: qty 50

## 2018-06-26 MED ORDER — HEPARIN (PORCINE) IN NACL 100-0.45 UNIT/ML-% IJ SOLN
900.0000 [IU]/h | INTRAMUSCULAR | Status: DC
  Administered 2018-06-26: 850 [IU]/h via INTRAVENOUS
  Administered 2018-06-27: 1000 [IU]/h via INTRAVENOUS
  Filled 2018-06-26 (×2): qty 250

## 2018-06-26 MED ORDER — IOPAMIDOL (ISOVUE-370) INJECTION 76%
50.0000 mL | Freq: Once | INTRAVENOUS | Status: AC | PRN
Start: 2018-06-26 — End: 2018-06-26
  Administered 2018-06-26: 50 mL via INTRAVENOUS

## 2018-06-26 MED ORDER — LORAZEPAM 2 MG/ML IJ SOLN
1.0000 mg | Freq: Once | INTRAMUSCULAR | Status: AC
  Administered 2018-06-26: 1 mg via INTRAVENOUS
  Filled 2018-06-26: qty 1

## 2018-06-26 MED ORDER — METOPROLOL TARTRATE 5 MG/5ML IV SOLN
2.5000 mg | Freq: Three times a day (TID) | INTRAVENOUS | Status: DC
  Administered 2018-06-26 – 2018-06-29 (×9): 2.5 mg via INTRAVENOUS
  Filled 2018-06-26 (×9): qty 5

## 2018-06-26 MED ORDER — LACTULOSE ENEMA
300.0000 mL | Freq: Two times a day (BID) | ORAL | Status: DC
  Administered 2018-06-26 – 2018-06-27 (×2): 300 mL via RECTAL
  Filled 2018-06-26 (×4): qty 300

## 2018-06-26 NOTE — Progress Notes (Addendum)
PT Cancellation Note  Patient Details Name: AZLIN ZILBERMAN MRN: [id number] DOB: 09-30-1956   Cancelled Treatment:    Reason Eval/Treat Not Completed: Patient at procedure or test/unavailable Will follow as time allows.  Lanney Gins, PT, DPT 06/26/18 12:07 PM Pager: [phone]

## 2018-06-26 NOTE — Progress Notes (Signed)
EEG complete - results pending 

## 2018-06-26 NOTE — Progress Notes (Signed)
PT Cancellation Note  Patient Details Name: Brandi [name] MRN: [id number] DOB: [birth year]   Cancelled Treatment:    Reason Eval/Treat Not Completed: Fatigue/lethargy limiting ability to participate PT attempting to see patient x2 today. Difficult to arouse. Will open eyes momentarily when calling her name, but quickly falls back asleep. Will follow.    Lanney Gins, PT, DPT 06/26/18 1:37 PM Pager: [phone]

## 2018-06-26 NOTE — Progress Notes (Signed)
ANTICOAGULATION CONSULT NOTE - Initial Consult  Pharmacy Consult for Heparin Indication: atrial fibrillation and stroke  Allergies  Allergen Reactions  . Penicillins Rash    Has patient had a PCN reaction causing immediate rash, facial/tongue/throat swelling, SOB or lightheadedness with hypotension: No Has patient had a PCN reaction causing severe rash involving mucus membranes or skin necrosis: No Has patient had a PCN reaction that required hospitalization No Has patient had a PCN reaction occurring within the last 10 years: No If all of the above answers are "NO", then may proceed with Cephalosporin use.     Patient Measurements: Height: 5' (152.4 cm) Weight: 166 lb 0.1 oz (75.3 kg) IBW/kg (Calculated) : 45.5 Heparin Dosing Weight: 62 kg  Vital Signs: Temp: 98.1 F (36.7 C) (07/12 0700) Temp Source: Oral (07/12 0700) BP: 97/57 (07/12 0700) Pulse Rate: 105 (07/12 0900)  Labs: Recent Labs    06/24/18 0312 06/25/18 0335 06/25/18 0539  HGB 9.4*  --  9.4*  HCT 32.9*  --  33.4*  PLT 544*  --  558*  LABPROT 16.7* 16.5*  --   INR 1.37 1.35  --   CREATININE 0.75 0.83  --     Estimated Creatinine Clearance: 63.7 mL/min (by C-G formula based on SCr of 0.83 mg/dL).   Medical History: Past Medical History:  Diagnosis Date  . Allergic rhinitis   . Anemia   . Arthritis HX RIGHT ANKLE FX  . Cancer [facility])    breast, lumpectomy  . Depression    MAJOR  . Dyslipidemia   . GERD (gastroesophageal reflux disease)   . History of breast cancer DX 2010--  S/P LUMPECTOMY AND RADIATION -- NO RECURRENCE   ONCOLOGIST- DR Truddie Coco  . Hypertension   . Left flank pain   . Left ureteral calculus   . Migraines   . Schizophrenic disorder ([place])   . Tonic-clonic seizure disorder ([place]) X1  2010--  NO SEIZURE SINCE  . Type II or unspecified type diabetes mellitus without mention of complication, not stated as uncontrolled 03/16/2014    Medications:  Scheduled:  . aspirin EC  81 mg Oral  Daily  . busPIRone  15 mg Oral Daily  . busPIRone  30 mg Oral QHS  . insulin aspart  0-9 Units Subcutaneous TID WC  . lactulose  30 g Oral TID  . lurasidone  120 mg Oral Daily  . metoprolol tartrate  25 mg Oral BID  . venlafaxine XR  300 mg Oral Daily   Infusions:  . famotidine (PEPCID) IV 20 mg (06/25/18 2340)  . lacosamide (VIMPAT) IV 100 mg (06/26/18 0015)  . levETIRAcetam 1,000 mg (06/26/18 0135)    Assessment: 62 yo F initially presented to [facility] on 7/9 with nausea, poor oral intake, new onset afib, abnormal LFTs, and anemia.  Pt noted with increased confusion 7/11 and MRI was completed which showed CVA.  Pt was transferred to [facility] for stroke eval.  Pt did not receive tPA due to unknown last seen normal time.  Ultimately pt needs oral anticoagulation but is NPO.  Pt has been cleared by GI and neuro to start anticoagulation with heparin.  Goal of Therapy:  Heparin level 0.3-0.5 units/ml Monitor platelets by anticoagulation protocol: Yes   Plan:  Start heparin infusion at 850 units/hr. Heparin level in 8 hours. Heparin level and CBC daily while on heparin. Follow-up swallow eval and ability to switch to oral anticoagulation.   [employer], Pharm.D., BCPS Clinical Pharmacist Pager: [phone] Clinical phone for  06/26/2018 from 8:30-4:00 is [phone].  **Pharmacist phone directory can now be found on [email or web address] ([email or web address]).  Listed under [place].  06/26/2018 11:57 AM

## 2018-06-26 NOTE — Progress Notes (Signed)
Subjective: Unable to voice any complaints.  Objective: Vital signs in last 24 hours: Temp:  [97.6 F (36.4 C)-98.1 F (36.7 C)] 98.1 F (36.7 C) (07/12 0443) Pulse Rate:  [51-97] 73 (07/12 0443) Resp:  [16-32] 30 (07/12 0443) BP: (74-128)/(35-88) 108/66 (07/12 0443) SpO2:  [91 %-100 %] 93 % (07/12 0443) Last BM Date: 06/26/18  Intake/Output from previous day: 07/11 0701 - 07/12 0700 In: 403.5 [P.O.:150; IV Piggyback:253.5] Out: 175 [Urine:175] Intake/Output this shift: No intake/output data recorded.  General appearance: uncooperative and combative GI: soft, non-tender; bowel sounds normal; no masses,  no organomegaly  Lab Results: Recent Labs    06/24/18 0312 06/25/18 0539  WBC 10.1 9.9  HGB 9.4* 9.4*  HCT 32.9* 33.4*  PLT 544* 558*   BMET Recent Labs    06/24/18 0312 06/25/18 0335  NA 139 142  K 3.5 4.1  CL 107 110  CO2 24 24  GLUCOSE 130* 136*  BUN 12 12  CREATININE 0.75 0.83  CALCIUM 8.3* 8.9   LFT Recent Labs    06/25/18 0335  PROT 5.6*  ALBUMIN 3.2*  AST 51*  ALT 80*  ALKPHOS 258*  BILITOT 1.0   PT/INR Recent Labs    06/24/18 0312 06/25/18 0335  LABPROT 16.7* 16.5*  INR 1.37 1.35   Hepatitis Panel Recent Labs    06/23/18 0929  HEPBSAG Negative  HCVAB <0.1  HEPAIGM Negative  HEPBIGM Negative   C-Diff No results for input(s): CDIFFTOX in the last 72 hours. Fecal Lactopherrin No results for input(s): FECLLACTOFRN in the last 72 hours.  Studies/Results: Mr Brain Wo Contrast  Result Date: 06/25/2018 CLINICAL DATA:  Encephalopathy.  Recent mental status change. EXAM: MRI HEAD WITHOUT CONTRAST TECHNIQUE: Multiplanar, multiecho pulse sequences of the brain and surrounding structures were obtained without intravenous contrast. COMPARISON:  CT head 06/23/2018 FINDINGS: Brain: Acute infarct right frontal lobe involving the frontal operculum. No other acute infarct. Negative for hemorrhage or mass Frontal and temporal lobe atrophy with  volume loss and ventricular enlargement. Atrophy has progressed since the prior MRI of 07/14/2009 Vascular: Normal arterial flow void Skull and upper cervical spine: Negative Sinuses/Orbits: Paranasal sinuses clear. Bilateral cataract surgery. Other: None IMPRESSION: Acute infarct right frontal operculum Progression of atrophy since 2010. Atrophy most prominent in the frontal and temporal lobes bilaterally. Electronically Signed   By: Franchot Gallo M.D.   On: 06/25/2018 14:26    Medications:  Scheduled: . aspirin EC  81 mg Oral Daily  . busPIRone  15 mg Oral Daily  . busPIRone  30 mg Oral QHS  . insulin aspart  0-9 Units Subcutaneous TID WC  . lactulose  30 g Oral TID  . lurasidone  120 mg Oral Daily  . metoprolol tartrate  25 mg Oral BID  . venlafaxine XR  300 mg Oral Daily   Continuous: . famotidine (PEPCID) IV 20 mg (06/25/18 2340)  . lacosamide (VIMPAT) IV 100 mg (06/26/18 0015)  . levETIRAcetam 1,000 mg (06/26/18 0135)    Assessment/Plan: 1) Hepatic encephalopathy. 2) CVA. 3) NASH cirrhosis. 4) Anemia. 5) New onset afib in NSR.   The patient is very restless and combative this AM.  Per nursing she only received her dosing of lactulose yesterday before her transfer.  The evening dosing was not administered as she was made NPO and Speech is to evaluate her this AM.  There was some report by the patient that she was unable to swallow.  Neurology feels that her CVA was from her  afib and she will need to be on anticoagulation.  Her anemia is not new, but the microcytosis is new.  However, the benefits of anticoagulation far outweigh the risk of bleeding.  If GI bleeding does occur endoscopic evaluation will be performed sooner.  Otherwise this issue can be worked up once she is stable from the CVA and hepatic encephalopathy standpoint.  Plan: 1) Patient is cleared for anticoagulation. 2) Oral lactulose is preferred, but if she cannot tolerate PO 300 ml lactulose enemas with a rectal  tube will be necessary.  LOS: 3 days   Deidrick Rainey D 06/26/2018, 7:03 AM

## 2018-06-26 NOTE — Care Management Important Message (Signed)
Important Message  Patient Details  Name: Brandi [name] MRN: [id number] Date of Birth: 1956-04-09   Medicare Important Message Given:  Yes    Petrona Wyeth Montine Circle 06/26/2018, 3:27 PM

## 2018-06-26 NOTE — Progress Notes (Signed)
STROKE TEAM PROGRESS NOTE  HPI ( Dr Aroor) : Brandi [name] is an 62 y.o. female past medical history ofhypertension, hyperlipidemia,  diabetes mellitus, seizures,breast cancer who presents [facility] on July 9 with nausea vomiting and abdominal pain.  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 fib with RVR and started on Cardizem drip.  So noted to have systolic heart failure with ejection fraction of 30% . During admission patient subsequently became more confused and therefore an MRI brain was ordered.  MRI brain showed a right frontal opercular stroke.  No other infarcts noted to explain patient's confusion.  Patient started on lactulose for suspected hepatic encephalopathy.  Ammonia was 22 on July 10 however.  Neurology was consulted for further stroke evaluation.  Patient currently on aspirin.  GI involved for management of cirrhosis.  Colonoscopy being considered as patient is also anemic with a recent hemoglobin dropped from 13 to about 11. No obvious GI bleeding.  Date last known well: Unclear, 06/23/18 tPA Given: No outside window MRS prior to admission :0   INTERVAL HISTORY she is alone in the room. She appears confused and aphasic. She has asterixis tremor in both outstretched upper extremity so.  Vitals:   06/26/18 0443 06/26/18 0700 06/26/18 0800 06/26/18 0900  BP: 108/66 (!) 97/57    Pulse: 73 72 97 (!) 105  Resp: (!) 30 18 (!) 23 (!) 21  Temp: 98.1 F (36.7 C) 98.1 F (36.7 C)    TempSrc: Oral Oral    SpO2: 93% 96% 98% 100%  Weight:  75.3 kg (166 lb 0.1 oz)    Height:        CBC:  Recent Labs  Lab 06/22/18 1350  06/24/18 0312 06/25/18 0539  WBC 15.1*   < > 10.1 9.9  NEUTROABS 11.5*  --   --  7.2  HGB 9.9*   < > 9.4* 9.4*  HCT 32.8*   < > 32.9* 33.4*  MCV 64*   < > 67.4* 67.7*  PLT 555*   < > 544* 558*   < > = values in this interval not displayed.    Basic Metabolic Panel:  Recent Labs  Lab  06/23/18 0432 06/24/18 0312 06/25/18 0335  NA  --  139 142  K  --  3.5 4.1  CL  --  107 110  CO2  --  24 24  GLUCOSE  --  130* 136*  BUN  --  12 12  CREATININE  --  0.75 0.83  CALCIUM  --  8.3* 8.9  MG 1.8  --   --    Lipid Panel:     Component Value Date/Time   CHOL 76 06/25/2018 0335   TRIG 74 06/25/2018 0335   HDL 33 (L) 06/25/2018 0335   CHOLHDL 2.3 06/25/2018 0335   VLDL 15 06/25/2018 0335   LDLCALC 28 06/25/2018 0335   LDLCALC 55 10/22/2017 1012   HgbA1c:  Lab Results  Component Value Date   HGBA1C 6.6 (A) 05/26/2018   Urine Drug Screen:     Component Value Date/Time   LABOPIA NONE DETECTED 06/23/2018 0448   COCAINSCRNUR NONE DETECTED 06/23/2018 0448   LABBENZ NONE DETECTED 06/23/2018 0448   AMPHETMU NONE DETECTED 06/23/2018 0448   THCU NONE DETECTED 06/23/2018 0448   LABBARB (A) 06/23/2018 0448    Result not available. Reagent lot number recalled by manufacturer.    Alcohol Level     Component Value  Date/Time   [facility]  07/13/2009 2050    <5        LOWEST DETECTABLE LIMIT FOR SERUM ALCOHOL IS 5 mg/dL FOR MEDICAL PURPOSES ONLY    IMAGING Mr Brain Wo Contrast  Result Date: 06/25/2018 CLINICAL DATA:  Encephalopathy.  Recent mental status change. EXAM: MRI HEAD WITHOUT CONTRAST TECHNIQUE: Multiplanar, multiecho pulse sequences of the brain and surrounding structures were obtained without intravenous contrast. COMPARISON:  CT head 06/23/2018 FINDINGS: Brain: Acute infarct right frontal lobe involving the frontal operculum. No other acute infarct. Negative for hemorrhage or mass Frontal and temporal lobe atrophy with volume loss and ventricular enlargement. Atrophy has progressed since the prior MRI of 07/14/2009 Vascular: Normal arterial flow void Skull and upper cervical spine: Negative Sinuses/Orbits: Paranasal sinuses clear. Bilateral cataract surgery. Other: None IMPRESSION: Acute infarct right frontal operculum Progression of atrophy since 2010. Atrophy most  prominent in the frontal and temporal lobes bilaterally. Electronically Signed   By: Franchot Gallo M.D.   On: 06/25/2018 14:26   2D echocardiogram - Left ventricle: The cavity size was mildly dilated. Wall thickness was increased in a pattern of moderate LVH. Systolic function was moderately to severely reduced. The estimated ejection fraction was in the range of 30% to 35%. Severe hypokinesis of the anteroseptal, anterior, inferoseptal, and apical myocardium. - Mitral valve: Mildly to moderately calcified annulus. Mobility was moderately restricted. There was mild to moderate regurgitation. - Left atrium: The atrium was severely dilated. - Right ventricle: The cavity size was moderately dilated. Wall thickness was normal. Systolic function was moderately reduced. - Right atrium: The atrium was moderately to severely dilated. - Pulmonary arteries: Systolic pressure was moderately to severely increased,estimated at approximately 45 mmHg. - Pericardium, extracardiac: A trivial pericardial effusion was identified.   PHYSICAL EXAM Pleasant middle-age Caucasian lady who appears not to be in distress. She has intermittent flapping tremor of outstretched upper extremities. . Afebrile. Head is nontraumatic. Neck is supple without bruit.    Cardiac exam no murmur or gallop. Lungs are clear to auscultation. Distal pulses are well felt. Neurological Exam :  She is awake and alert disoriented 2. Diminished attention registration and recall. Follows commands only intermittently. There is some verbal perseveration.mild right lower facial asymmetry. Tongue midline. Motor system exam able to move all 4 extremity against gravity but poor effort. Mild right upper and lower extremity weakness. Deep reflexes symmetric. Plantars are downgoing. Gait not tested.  See variation. ASSESSMENT/PLAN Brandi [name] is a 62 y.o. female with history of HTN, HLD, DB, seizures, breast cancer presenting to [facility] 7/9 w/  nausea, vomiting and abd pain. Dx w/ cirrhosis d/t NASH and developed AF w/ RVR, found CHF w/ EF 30%. Developed increasing confusion. MRI showed R frontal opercular infarct.   Stroke:  right frontal opercular infarct embolic secondary to atrial fibrillation .questionable new right-sided weakness 06/26/18  CT head 7/9 No acute stroke. atrophy.  Small vessel disease.    MRI acute right frontal opercular infarct.  Progression of atrophy bilateral frontal and temporal lobes  CTA head & neck ordered  2D Echo   EEG mild generalized slowing. No seizure activity  LDL 28  HgbA1c 6.6  SCDs for VTE prophylaxis Diet Order           Diet NPO time specified  Diet effective now           aspirin 81 mg daily prior to admission, now on aspirin 81 mg daily. Plan to change to eliquis been  able to swallow  NIH stroke scale to every shift   Neuro check every 4   therapy recommendations:  pending   Disposition:  pending   Atrial Fibrillation with RVR, new dx  Recent hemoglobin dropped from 13 to 11   CHA2DS2-VASc Score = 6, ?2 oral anticoagulation recommended  Age in Years:  <65   0  Sex:  Female   Female   +1    Hypertension History:  yes   +1     Diabetes Mellitus:  yes   +1  Congestive Heart Failure History:  yes   +1  Vascular Disease History:  0     Stroke/TIA/Thromboembolism History:  yes   +2 . Now converted back to normal sinus rhythm  . Cardiology recommends [facility]. ok from neurology standpoint as well . continue Eliquis (apixaban) daily at discharge  Hx Hypertension.   Hypotension  Systolic blood pressure low, 80s to 100s     From stroke standpoint, okay for Permissive hypertension (OK if < 220/120) but gradually normalize in 5-7 days . Long-term BP goal normotensive  Hyperlipidemia  Home meds: Lipitor 20, NOTresumed in hospital  LDL 28, goal < 70  Hold lipitor given cirrhosis and low LDL  Diabetes type II  HgbA1c 6.6, at goal < 7.0  Other Stroke Risk  Factors  Obesity, Body mass index is 32.42 kg/m., recommend weight loss, diet and exercise as appropriate   Family hx stroke (maternal grandmother)  New onset systolic heart failure, EF 30-35%  Other Active Problems  Cirrhosis due to NASH  acute encephalopathy, hepatic with elevated ammonia and asterixis .  On lactulose   breast cancer Truddie Coco )  Anemia with recent hemoglobin dropped from 13 to 11   seizure d/o on Keppra and Vimpat  GERD  Anxiety/depression  Hospital day # [place], MSN, APRN, ANVP-BC, AGPCNP-BC Advanced Practice Stroke Nurse [place] for Schedule & Pager information 06/26/2018 10:52 AM  I have personally examined this patient, reviewed notes, independently viewed imaging studies, participated in medical decision making and plan of care.ROS completed by me personally and pertinent positives fully documented  I have made any additions or clarifications directly to the above note. Agree with note above.  Patient was admitted with abdominal pain and cirrhosis but developed confusion and MRI shows a right opercular frontal MCA branch infarct likely emboli from atrial fibrillation. She also has some subjective right-sided weakness and recommend checking CT scan of the head today to rule out interval new strokes. She may need to be started on anticoagulation but may not be a good long-term candidate given history of cirrhosis and risk for bleeding. Continue ongoing stroke workup. Continue Keppra and Vimpat for seizures. Discussed with Dr. Thurmond Butts. No family available at the bedside. Greater than 50% time during this 35 minute visit was spent on counseling and coordination of care about her embolic stroke  Antony Contras, MD Medical Director [place] Pager: [phone] 06/26/2018 3:17 PM  To contact Stroke Continuity provider, please refer to [email or web address] After hours, contact General Neurology

## 2018-06-26 NOTE — Progress Notes (Signed)
[place] for Heparin Indication: atrial fibrillation and stroke  Allergies  Allergen Reactions  . Penicillins Rash    Has patient had a PCN reaction causing immediate rash, facial/tongue/throat swelling, SOB or lightheadedness with hypotension: No Has patient had a PCN reaction causing severe rash involving mucus membranes or skin necrosis: No Has patient had a PCN reaction that required hospitalization No Has patient had a PCN reaction occurring within the last 10 years: No If all of the above answers are "NO", then may proceed with Cephalosporin use.     Patient Measurements: Height: 5' (152.4 cm) Weight: 166 lb 0.1 oz (75.3 kg) IBW/kg (Calculated) : 45.5 Heparin Dosing Weight: 62 kg  Vital Signs: Temp: 96.9 F (36.1 C) (07/12 1957) Temp Source: Oral (07/12 1957) BP: 128/92 (07/12 1957) Pulse Rate: 103 (07/12 1610)  Labs: Recent Labs    06/24/18 0312 06/25/18 0335 06/25/18 0539 06/26/18 2048  HGB 9.4*  --  9.4*  --   HCT 32.9*  --  33.4*  --   PLT 544*  --  558*  --   LABPROT 16.7* 16.5*  --   --   INR 1.37 1.35  --   --   HEPARINUNFRC  --   --   --  <0.10*  CREATININE 0.75 0.83  --   --     Estimated Creatinine Clearance: 63.7 mL/min (by C-G formula based on SCr of 0.83 mg/dL).   Medical History: Past Medical History:  Diagnosis Date  . Allergic rhinitis   . Anemia   . Arthritis HX RIGHT ANKLE FX  . Cancer [facility])    breast, lumpectomy  . Depression    MAJOR  . Dyslipidemia   . GERD (gastroesophageal reflux disease)   . History of breast cancer DX 2010--  S/P LUMPECTOMY AND RADIATION -- NO RECURRENCE   ONCOLOGIST- DR Truddie Coco  . Hypertension   . Left flank pain   . Left ureteral calculus   . Migraines   . Schizophrenic disorder ([place])   . Tonic-clonic seizure disorder ([place]) X1  2010--  NO SEIZURE SINCE  . Type II or unspecified type diabetes mellitus without mention of complication, not stated as uncontrolled  03/16/2014    Medications:  Scheduled:  . busPIRone  15 mg Oral Daily  . busPIRone  30 mg Oral QHS  . insulin aspart  0-9 Units Subcutaneous TID WC  . iopamidol      . lactulose  300 mL Rectal BID  . lurasidone  120 mg Oral Daily  . metoprolol tartrate  2.5 mg Intravenous Q8H  . venlafaxine XR  300 mg Oral Daily   Infusions:  . famotidine (PEPCID) IV 20 mg (06/26/18 2216)  . heparin 850 Units/hr (06/26/18 1413)  . lacosamide (VIMPAT) IV 100 mg (06/26/18 2117)  . levETIRAcetam 1,000 mg (06/26/18 1402)    Assessment: 62 yo F initially presented to [facility] on 7/9 with nausea, poor oral intake, new onset afib, abnormal LFTs, and anemia.  Pt noted with increased confusion 7/11 and MRI was completed which showed CVA.  Pt was transferred to [facility] for stroke eval.  Pt did not receive tPA due to unknown last seen normal time.  Ultimately pt needs oral anticoagulation but is NPO.  Pt has been cleared by GI and neuro to start anticoagulation with heparin. Initial heparin level <0.1 units/ml  Goal of Therapy:  Heparin level 0.3-0.5 units/ml Monitor platelets by anticoagulation protocol: Yes   Plan:  Increase heparin  infusion to 1000 units/hr. Heparin level in ~ 7 hours Heparin level and CBC daily while on heparin. Follow-up swallow eval and ability to switch to oral anticoagulation.   Thanks for allowing pharmacy to be a part of this patient's care.  Excell Seltzer, PharmD Clinical Pharmacist 06/26/2018 11:23 PM

## 2018-06-26 NOTE — Progress Notes (Deleted)
SLP Cancellation Note  Patient Details Name: Brandi [name] MRN: [id number] DOB: 21-Nov-1956   Cancelled treatment:       Reason Eval/Treat Not Completed: Patient at procedure or test/unavailable. Will continue efforts.  Miken Stecher B. Quentin [facility], CCC-SLP Speech Language Pathologist [phone]  Shonna Chock 06/26/2018, 9:50 AM

## 2018-06-26 NOTE — Procedures (Signed)
ELECTROENCEPHALOGRAM REPORT  Date of Study: 06/26/2018  Patient's Name: Brandi [name] MRN: [id number] Date of Birth: 12/002/1957  Referring Provider: Dr. Lesia Sago  Clinical History: This is a 62 year old woman with altered mental status.  Medications: Keppra Ativan  Technical Summary: A multichannel digital EEG recording measured by the international 10-20 system with electrodes applied with paste and impedances below 5000 ohms performed as portable with EKG monitoring in an awake and asleep patient.  Hyperventilation was not performed. Photic stimulation was performed.  The digital EEG was referentially recorded, reformatted, and digitally filtered in a variety of bipolar and referential montages for optimal display.   Description: The patient is awake and asleep during the recording.  During maximal wakefulness, there is a symmetric, medium voltage 5-6 Hz posterior dominant rhythm that poorly attenuates with eye opening and eye closure. This is admixed with a moderate amount of diffuse 4-5 Hz theta and occasional diffuse 2-3 Hz delta slowing of the waking background.  During drowsiness and sleep, there is an increase in theta and delta slowing of the background with poorly formed vertex waves and sleep spindles seen.  Photic stimulation did not elicit any abnormalities. There were a few sharp transients seen without clear epileptogenic potential, some appeared artifactual.  There were no clear epileptiform discharges or electrographic seizures seen.    EKG lead was unremarkable.  Impression: This awake and asleep EEG is abnormal due to mild to moderate diffuse background slowing.  Clinical Correlation of the above findings indicates diffuse cerebral dysfunction that is non-specific in etiology and can be seen with hypoxic/ischemic injury, toxic/metabolic encephalopathies, neurodegenerative disorders, or medication effect.  The absence of epileptiform discharges does not rule  out a clinical diagnosis of epilepsy.  Clinical correlation is advised.   Ellouise Newer, M.D.

## 2018-06-26 NOTE — Progress Notes (Signed)
Progress Note  Patient Name: Brandi [name] Date of Encounter: 06/26/2018  Primary Cardiologist: Mertie Moores, MD   Subjective     Inpatient Medications    Scheduled Meds: . aspirin EC  81 mg Oral Daily  . busPIRone  15 mg Oral Daily  . busPIRone  30 mg Oral QHS  . insulin aspart  0-9 Units Subcutaneous TID WC  . lactulose  30 g Oral TID  . lurasidone  120 mg Oral Daily  . metoprolol tartrate  25 mg Oral BID  . venlafaxine XR  300 mg Oral Daily   Continuous Infusions: . famotidine (PEPCID) IV 20 mg (06/25/18 2340)  . lacosamide (VIMPAT) IV 100 mg (06/26/18 0015)  . levETIRAcetam 1,000 mg (06/26/18 0135)   PRN Meds: bisacodyl, gi cocktail, HYDROcodone-acetaminophen, LORazepam, ondansetron **OR** ondansetron (ZOFRAN) IV, senna-docusate   Vital Signs    Vitals:   06/25/18 2244 06/26/18 0048 06/26/18 0443 06/26/18 0700  BP: 99/75 98/64 108/66 (!) 97/57  Pulse: 95 (!) 51 73 72  Resp: (!) 23 20 (!) 30 18  Temp: 97.9 F (36.6 C)  98.1 F (36.7 C) 98.1 F (36.7 C)  TempSrc: Oral  Oral Oral  SpO2: 92% 93% 93% 96%  Weight:    166 lb 0.1 oz (75.3 kg)  Height:        Intake/Output Summary (Last 24 hours) at 06/26/2018 0850 Last data filed at 06/26/2018 0300 Gross per 24 hour  Intake 403.54 ml  Output 175 ml  Net 228.54 ml   Filed Weights   06/24/18 0500 06/25/18 0500 06/26/18 0700  Weight: 162 lb 11.2 oz (73.8 kg) 171 lb 8.3 oz (77.8 kg) 166 lb 0.1 oz (75.3 kg)    Telemetry    Sinus rhythm, not much ectopy  ECG    Sinus rhythm, first degree heart block - Personally Reviewed  Physical Exam    Physical Exam: Blood pressure (!) 97/57, pulse 72, temperature 98.1 F (36.7 C), temperature source Oral, resp. rate 18, height 5' (1.524 m), weight 166 lb 0.1 oz (75.3 kg), SpO2 96 %.  GEN:   Frail,  Awake,  Minimally responsive this am  HEENT: Normal NECK: No JVD; No carotid bruits LYMPHATICS: No lymphadenopathy CARDIAC:  RR  RESPIRATORY:  Clear to  auscultation without rales, wheezing or rhonchi  ABDOMEN: Soft, non-tender, non-distended MUSCULOSKELETAL:  No edema; No deformity  SKIN: Warm and dry NEUROLOGIC:   Minimally response   Labs    Chemistry Recent Labs  Lab 06/23/18 0119 06/24/18 0312 06/25/18 0335  NA 137 139 142  K 3.2* 3.5 4.1  CL 98 107 110  CO2 27 24 24   GLUCOSE 134* 130* 136*  BUN 18 12 12   CREATININE 0.87 0.75 0.83  CALCIUM 9.0 8.3* 8.9  PROT 6.4* 5.7* 5.6*  ALBUMIN 3.5 3.2* 3.2*  AST 40 47* 51*  ALT 101* 89* 80*  ALKPHOS 249* 223* 258*  BILITOT 1.1 1.4* 1.0  GFRNONAA >60 >60 >60  GFRAA >60 >60 >60  ANIONGAP 12 8 8      Hematology Recent Labs  Lab 06/23/18 0119 06/24/18 0312 06/25/18 0539  WBC 12.1* 10.1 9.9  RBC 5.26* 4.88 4.93  HGB 10.1* 9.4* 9.4*  HCT 35.1* 32.9* 33.4*  MCV 66.7* 67.4* 67.7*  MCH 19.2* 19.3* 19.1*  MCHC 28.8* 28.6* 28.1*  RDW 19.2* 19.6* 19.8*  PLT 600* 544* 558*    Cardiac Enzymes  Recent Labs  Lab 06/23/18 0437  TROPIPOC 0.02     BNP  Recent Labs  Lab 06/23/18 0119 06/23/18 0929  BNP 689.0* 607.3*     Radiology    Mr Brain Wo Contrast  Result Date: 06/25/2018 CLINICAL DATA:  Encephalopathy.  Recent mental status change. EXAM: MRI HEAD WITHOUT CONTRAST TECHNIQUE: Multiplanar, multiecho pulse sequences of the brain and surrounding structures were obtained without intravenous contrast. COMPARISON:  CT head 06/23/2018 FINDINGS: Brain: Acute infarct right frontal lobe involving the frontal operculum. No other acute infarct. Negative for hemorrhage or mass Frontal and temporal lobe atrophy with volume loss and ventricular enlargement. Atrophy has progressed since the prior MRI of 07/14/2009 Vascular: Normal arterial flow void Skull and upper cervical spine: Negative Sinuses/Orbits: Paranasal sinuses clear. Bilateral cataract surgery. Other: None IMPRESSION: Acute infarct right frontal operculum Progression of atrophy since 2010. Atrophy most prominent in the  frontal and temporal lobes bilaterally. Electronically Signed   By: Franchot Gallo M.D.   On: 06/25/2018 14:26    Cardiac Studies   Echo 06/23/18: Study Conclusions - Left ventricle: The cavity size was mildly dilated. Wall   thickness was increased in a pattern of moderate LVH. Systolic   function was moderately to severely reduced. The estimated   ejection fraction was in the range of 30% to 35%. Severe   hypokinesis of the anteroseptal, anterior, inferoseptal, and   apical myocardium. - Mitral valve: Mildly to moderately calcified annulus. Mobility   was moderately restricted. There was mild to moderate   regurgitation. - Left atrium: The atrium was severely dilated. - Right ventricle: The cavity size was moderately dilated. Wall   thickness was normal. Systolic function was moderately reduced. - Right atrium: The atrium was moderately to severely dilated. - Pulmonary arteries: Systolic pressure was moderately to severely   increased,estimated at approximately 45 mmHg. - Pericardium, extracardiac: A trivial pericardial effusion was   identified.  Patient Profile     62 y.o. female with a hx of breast cancer treated in 2010 w/ lumpectomy (no radiation), DLD, HTN, h/o seizures, GERD, depression and no prior cardiac history, who is being seen today for the evaluation of new atrial fibrillation, in the setting of acute GI illness.   Converted to NSR overnight, dilt off. Baseline cognitive impairment.  Assessment & Plan    1. New onset atrial fibrillation with RVR CHADS2VASC is now 5 ( HTN, female, CHF, CVA)   has converted back to NSR  She needs to start DOAC - will wait for neuro to give the OK to start anticoagulation   2. New onset systolic heart failure - EF 30-35% with hypokinesis of the anteroseptal, anterior, inferoseptal, and apical myocardium - severely dilated left and right atria -PAS 45 With her recent cva, she is not currently a candidate for cardiac cath. Will  need to wait until she recovers from her CVA  Continue medical therapy  Lasix Metoprolol BP is too low to start ARB     Mertie Moores, MD  06/26/2018 8:56 AM    [address],  [place], [place]  [address] Pager [phone] Phone: [phone]; Fax: [phone]

## 2018-06-26 NOTE — Evaluation (Signed)
Clinical/Bedside Swallow Evaluation Patient Details  Name: Brandi [name] MRN: [id number] Date of Birth: Sep 12, 1956  Today's Date: 06/26/2018 Time: SLP Start Time (ACUTE ONLY): 0950 SLP Stop Time (ACUTE ONLY): 1015 SLP Time Calculation (min) (ACUTE ONLY): 25 min  Past Medical History:  Past Medical History:  Diagnosis Date  . Allergic rhinitis   . Anemia   . Arthritis HX RIGHT ANKLE FX  . Cancer [facility])    breast, lumpectomy  . Depression    MAJOR  . Dyslipidemia   . GERD (gastroesophageal reflux disease)   . History of breast cancer DX 2010--  S/P LUMPECTOMY AND RADIATION -- NO RECURRENCE   ONCOLOGIST- DR Truddie Coco  . Hypertension   . Left flank pain   . Left ureteral calculus   . Migraines   . Schizophrenic disorder ([place])   . Tonic-clonic seizure disorder ([place]) X1  2010--  NO SEIZURE SINCE  . Type II or unspecified type diabetes mellitus without mention of complication, not stated as uncontrolled 03/16/2014   Past Surgical History:  Past Surgical History:  Procedure Laterality Date  . BREAST SURGERY  01-04-2009  DR Margot Chimes   LEFT BREAST LUMPECTOMY W/ SLN DISSECTION  FOR CANCER  . CATARACT EXTRACTION, BILATERAL Bilateral   . CESAREAN SECTION  1998  . CHILD BIRTH     C-SECTION  . CYSTOSCOPY WITH RETROGRADE PYELOGRAM, URETEROSCOPY AND STENT PLACEMENT  11/10/2012   Procedure: CYSTOSCOPY WITH RETROGRADE PYELOGRAM, URETEROSCOPY AND STENT PLACEMENT;  Surgeon: Hanley Ben, MD;  Location: [facility];  Service: Urology;  Laterality: Left;  WITH DOUBLE J STENT  . HOLMIUM LASER APPLICATION  78/29/5621   Procedure: HOLMIUM LASER APPLICATION;  Surgeon: Hanley Ben, MD;  Location: [facility];  Service: Urology;  Laterality: Left;  . LAPAROSCOPIC CHOLECYSTECTOMY  1990'S   HPI:  62 year old female admitted 06/23/18 with nausea, decreased po intake. PMH: HTN, HLD, depression, schizophrenia, Left breast CA, GERD, DM. MRI = acute infarct right frontal  operculum   Assessment / Plan / Recommendation Clinical Impression  Pt noted to be sleeping upon arrival of SLP. RN assisted to reposition pt. SLP provided oral care with suction, which pt tolerated well, but kept eyes closed throughout. Pt would open eyes to voice and responded appropriately to "can you say good morning". Speech intelligible, but no other vocalizations elicited.   Pt was given a small ice chip, and exhibited minimal manipulation and suspected delayed swallow reflex. Laryngeal elevation seemed reduced to palpation. Second ice chip was provided, and pt exhibited no oral manipulation at all. Ice/water was suctioned from pt mouth. Moisturizer was applied to pt lips and BSE was terminated.   Recommend continuing with strict NPO status at this time, due to lethargy and current bedside presentation. SLP will continue to follow for assessment of po readiness. RN and MD informed of results and recommendations.    SLP Visit Diagnosis: Dysphagia, unspecified (R13.10)    Aspiration Risk  Severe aspiration risk    Diet Recommendation NPO   Medication Administration: Via alternative means    Other  Recommendations Oral Care Recommendations: Oral care QID Other Recommendations: Have oral suction available   Follow up Recommendations (TBD)      Frequency and Duration min 2x/week  2 weeks;1 week       Prognosis Prognosis for Safe Diet Advancement: Fair      Swallow Study   General Date of Onset: 06/23/18 HPI: 62 year old female admitted 06/23/18 with nausea, decreased po intake.  PMH: HTN, HLD, depression, schizophrenia, Left breast CA, GERD, DM. MRI = acute infarct right frontal operculum Type of Study: Bedside Swallow Evaluation Previous Swallow Assessment: no prior ST intervention found Diet Prior to this Study: NPO Temperature Spikes Noted: No Respiratory Status: Nasal cannula History of Recent Intubation: No Behavior/Cognition: Lethargic/Drowsy;Doesn't follow  directions;Requires cueing Oral Cavity Assessment: Within Functional Limits Oral Care Completed by SLP: Yes Oral Cavity - Dentition: Adequate natural dentition Vision: Functional for self-feeding Self-Feeding Abilities: Total assist Patient Positioning: Upright in bed Baseline Vocal Quality: Low vocal intensity Volitional Cough: Cognitively unable to elicit Volitional Swallow: Unable to elicit    Oral/Motor/Sensory Function Overall Oral Motor/Sensory Function: (unable to assess at this time)   Ice Chips Ice chips: Impaired Presentation: Spoon Oral Phase Impairments: Poor awareness of bolus;Reduced lingual movement/coordination Oral Phase Functional Implications: Left anterior spillage Pharyngeal Phase Impairments: Suspected delayed Swallow;Decreased hyoid-laryngeal movement   Thin Liquid Thin Liquid: Not tested    Nectar Thick Nectar Thick Liquid: Not tested   Honey Thick Honey Thick Liquid: Not tested   Puree Puree: Not tested   Solid   GO   Solid: Not tested       Ellanor Feuerstein B. Quentin Ore, [facility], [place] Speech Language Pathologist [phone]  Shonna Chock 06/26/2018,10:24 AM

## 2018-06-26 NOTE — Progress Notes (Signed)
PROGRESS NOTE  Brandi [name]  TOI:[id number] DOB: Sep 23, 1956 DOA: 06/23/2018 PCP: Denita Lung, MD   Brief Narrative: Brandi [name] is a 62 y.o. female with a history of HTN, HLD, seizure disorder, left breast CA s/p lumpectomy 2009, GERD and depression who presented to the ED with abdominal pain, nausea, vomiting, and weakness with decreased per oral intake. She had been evaluated by PCP with elevated LFTs (AST 302, ALT 611, and AP 380) and subsequent CT demonstrating prominent caudate lobe and surface irregularity of the liver. Appearance on U/S in the ED confirmed this. She demonstrated lethargy and asterixis on exam and had elevated ammonia consistent with hepatic encephalopathy. In the ED she was found to have new-onset AFib with RVR which quickly converted to NSR with diltiazem which was subsequently converted to metoprolol due to EF decrease to 30-35% by cardiology. Further evaluation revealed microcytic anemia with ferritin of 9. GI was consulted, recommended lactulose and eventual endoscopic evaluation of iron-deficiency anemia. With ongoing encephalopathy, CT head was checked and revealed a right frontal operculum CVA so patient was transferred to [facility] for stroke evaluation and neurology consultation. Neurology feels this small infarct is embolic due to AFib and not the likely cause of encephalopathy. Anticoagulation was recommended by cardiology for AFib now with CVA, GI has given their blessing for this, and neurology also agrees that anticoagulation is indicated. Heparin is started due to inability of pt to take PO.   Assessment & Plan: Principal Problem:   Atrial fibrillation with RVR (HCC) Active Problems:   Major depression   Migraine headache   GERD (gastroesophageal reflux disease)   History of breast cancer in female   Hyperlipidemia associated with type 2 diabetes mellitus (HCC)   Leukocytosis   Type 2 diabetes mellitus without complication, without long-term current use  of insulin (HCC)   Hypertension associated with diabetes ([place])   Anemia   Elevated LFTs   Malaise   Schizophrenia (HCC)   History of seizures   Nausea and vomiting   Cerebral embolism with cerebral infarction  NASH cirrhosis with hepatic encephalopathy: EEG with mild generalized slowing.  - Appreciate GI recommendations.  - Will have to give lactulose PR given inability to take PO.   New onset AFib with RVR: Converted to NSR. TSH 0.532. Note biatrial dilatation on echo. - Continue beta blocker, will have to convert to IV low dose - Start anticoagulation for a CHA2DS2-VASc score of now 6 with CHF, CVA, DM.   - Monitor and replace K and Mg as needed.   New onset HFrEF: Echo this admission showed EF 30-35% with hypokinesis of the anteroseptal, anterior, inferoseptal, and apical myocardium and severely dilated left and right atria. - Beta blocker, lasix started. Hypotension limiting ACE/ARB.  - Will need ischemic evaluation, though currently not possible.   Iron-deficiency anemia: With chronic decrease in hgb and microcytosis. Ferritin low on panel here. Had incomplete colonoscopy previously. No overt GI bleeding noted.  - GI recommends work up once pt more stable.   Right frontal operculum infarct: Most likely embolic in light of AFib. - CTA head, neck per neurology - Neurology opts for eliquis, will start heparin as she's NPO.    Right-sided weakness: With exam today - Check CT head now to r/o hemorrhage with possible inception of heparin - Monitor NIHSS, neuro checks q4h - PT/OT once able to participate - Not on statin due to LDL of 28 and cirrhosis  Seizure disorder:  - Per neurology, AEDs converted  to IV  HTN:  - Permissive HTN ok, but currently hypotensive  T2DM: HbA1c 6.6% - SSI  DVT prophylaxis: Heparin gtt Code Status: Full Family Communication: None at bedside Disposition Plan: Uncertain  Consultants:    Cardiology  Neurology  Gastroenterology  Procedures:   Echocardiogram - Left ventricle: The cavity size was mildly dilated. Wallthickness was increased in a pattern of moderate LVH. Systolicfunction was moderately to severely reduced. The estimatedejection fraction was in the range of 30% to 35%. Severehypokinesis of the anteroseptal, anterior, inferoseptal, andapical myocardium. - Mitral valve: Mildly to moderately calcified annulus. Mobilitywas moderately restricted. There was mild to moderateregurgitation. - Left atrium: The atrium was severely dilated. - Right ventricle: The cavity size was moderately dilated. Wallthickness was normal. Systolic function was moderately reduced. - Right atrium: The atrium was moderately to severely dilated. - Pulmonary arteries: Systolic pressure was moderately to severelyincreased,estimated at approximately 45 mmHg. - Pericardium, extracardiac: A trivial pericardial effusion wasidentified.  Antimicrobials:  None   Subjective: Confused, opens eyes momentarily and answers questions. Doesn't know where she is. Denies any pain.   Objective: Vitals:   06/26/18 0700 06/26/18 0800 06/26/18 0900 06/26/18 1234  BP: (!) 97/57   90/62  Pulse: 72 97 (!) 105 (!) 109  Resp: 18 (!) 23 (!) 21 19  Temp: 98.1 F (36.7 C)   97.9 F (36.6 C)  TempSrc: Oral   Axillary  SpO2: 96% 98% 100% 100%  Weight: 75.3 kg (166 lb 0.1 oz)     Height:        Intake/Output Summary (Last 24 hours) at 06/26/2018 1550 Last data filed at 06/26/2018 0900 Gross per 24 hour  Intake 353.54 ml  Output 175 ml  Net 178.54 ml   Filed Weights   06/24/18 0500 06/25/18 0500 06/26/18 0700  Weight: 73.8 kg (162 lb 11.2 oz) 77.8 kg (171 lb 8.3 oz) 75.3 kg (166 lb 0.1 oz)    Gen: 62 y.o. female  Pulm: Non-labored breathing. Clear to auscultation bilaterally.  CV: Regular rate and rhythm. No murmur, rub, or gallop. No JVD, or pedal edema. GI: Abdomen soft, non-tender,  non-distended, with normoactive bowel sounds. No organomegaly or masses felt. Ext: Warm, no deformities Skin: No rashes, lesions or ulcers Neuro: Lethargic but rousable, not oriented. +Asterixis bilaterally. Subtle RUE weakness to gravity.  Psych: Judgement and insight appear impaired   Data Reviewed: I have personally reviewed following labs and imaging studies  CBC: Recent Labs  Lab 06/20/18 1159 06/22/18 1350 06/23/18 0119 06/24/18 0312 06/25/18 0539  WBC 8.7 15.1* 12.1* 10.1 9.9  NEUTROABS 6.5 11.5*  --   --  7.2  HGB 10.5* 9.9* 10.1* 9.4* 9.4*  HCT 36.5 32.8* 35.1* 32.9* 33.4*  MCV 66.5* 64* 66.7* 67.4* 67.7*  PLT 556* 555* 600* 544* 357*   Basic Metabolic Panel: Recent Labs  Lab 06/20/18 1159 06/22/18 1350 06/23/18 0119 06/23/18 0432 06/24/18 0312 06/25/18 0335  NA 138 138 137  --  139 142  K 3.5 4.4 3.2*  --  3.5 4.1  CL 100 99 98  --  107 110  CO2 26 30* 27  --  24 24  GLUCOSE 159* 139* 134*  --  130* 136*  BUN 16 19 18   --  12 12  CREATININE 0.83 0.89 0.87  --  0.75 0.83  CALCIUM 9.0 9.7 9.0  --  8.3* 8.9  MG  --   --   --  1.8  --   --  GFR: Estimated Creatinine Clearance: 63.7 mL/min (by C-G formula based on SCr of 0.83 mg/dL). Liver Function Tests: Recent Labs  Lab 06/20/18 1159 06/22/18 1350 06/23/18 0119 06/24/18 0312 06/25/18 0335  AST 51* 35 40 47* 51*  ALT 163* 115* 101* 89* 80*  ALKPHOS 267* 307* 249* 223* 258*  BILITOT 1.3* 1.1 1.1 1.4* 1.0  PROT 6.0* 5.9* 6.4* 5.7* 5.6*  ALBUMIN 3.3* 3.8 3.5 3.2* 3.2*   Recent Labs  Lab 06/20/18 1159 06/23/18 0119  LIPASE 35 37   Recent Labs  Lab 06/24/18 1225 06/26/18 0410  AMMONIA 22 38*   Coagulation Profile: Recent Labs  Lab 06/20/18 1159 06/23/18 0432 06/24/18 0312 06/25/18 0335  INR 1.28 1.34 1.37 1.35   Cardiac Enzymes: No results for input(s): CKTOTAL, CKMB, CKMBINDEX, TROPONINI in the last 168 hours. BNP (last 3 results) No results for input(s): PROBNP in the last 8760  hours. HbA1C: No results for input(s): HGBA1C in the last 72 hours. CBG: Recent Labs  Lab 06/25/18 1140 06/25/18 1616 06/25/18 2316 06/26/18 0628 06/26/18 1231  GLUCAP 141* 123* 130* 111* 135*   Lipid Profile: Recent Labs    06/25/18 0335  CHOL 76  HDL 33*  LDLCALC 28  TRIG 74  CHOLHDL 2.3   Thyroid Function Tests: No results for input(s): TSH, T4TOTAL, FREET4, T3FREE, THYROIDAB in the last 72 hours. Anemia Panel: Recent Labs    06/25/18 0335  FERRITIN 9*  TIBC 375  IRON 14*   Urine analysis:    Component Value Date/Time   COLORURINE YELLOW 06/23/2018 [place] 06/23/2018 0434   APPEARANCEUR Cloudy 06/20/2013 1745   LABSPEC 1.014 06/23/2018 0434   LABSPEC 1.012 06/20/2013 1745   PHURINE 6.0 06/23/2018 0434   GLUCOSEU NEGATIVE 06/23/2018 0434   GLUCOSEU Negative 06/20/2013 1745   HGBUR NEGATIVE 06/23/2018 0434   BILIRUBINUR NEGATIVE 06/23/2018 0434   BILIRUBINUR n 05/17/2015 1425   BILIRUBINUR Negative 06/20/2013 1745   KETONESUR NEGATIVE 06/23/2018 0434   PROTEINUR NEGATIVE 06/23/2018 0434   UROBILINOGEN negative 05/17/2015 1425   UROBILINOGEN 0.2 06/29/2013 1530   NITRITE NEGATIVE 06/23/2018 0434   LEUKOCYTESUR NEGATIVE 06/23/2018 0434   LEUKOCYTESUR 3+ 06/20/2013 1745   Recent Results (from the past 240 hour(s))  MRSA PCR Screening     Status: None   Collection Time: 06/23/18 10:33 AM  Result Value Ref Range Status   MRSA by PCR NEGATIVE NEGATIVE Final    Comment:        The GeneXpert MRSA Assay (FDA approved for NASAL specimens only), is one component of a comprehensive MRSA colonization surveillance program. It is not intended to diagnose MRSA infection nor to guide or monitor treatment for MRSA infections. Performed at [facility], [address] [place], [place] [address]   Culture, blood (routine x 2)     Status: None (Preliminary result)   Collection Time: 06/24/18 12:56 PM  Result Value Ref Range  Status   Specimen Description   Final    BLOOD LEFT ANTECUBITAL Performed at [facility] [address], [place], [place] [address]    Special Requests   Final    BOTTLES DRAWN AEROBIC ONLY Blood Culture adequate volume Performed at [facility] [address], [place], [place] [address]    Culture   Final    NO GROWTH 2 DAYS Performed at [facility] [address] [place], [place] [address]    Report Status PENDING  Incomplete  Culture, blood (routine x 2)     Status: None (Preliminary result)   Collection Time: 06/24/18  1:03 PM  Result Value Ref Range Status   Specimen Description   Final    BLOOD LEFT HAND Performed at [facility] [address] [place], [place] [address]    Special Requests   Final    BOTTLES DRAWN AEROBIC ONLY Blood Culture results may not be optimal due to an inadequate volume of blood received in culture bottles Performed at [facility] [address], [place], [place] [address]    Culture   Final    NO GROWTH 2 DAYS Performed at [facility] [address] [place], [place] [address]    Report Status PENDING  Incomplete      Radiology Studies: Mr Brain Wo Contrast  Result Date: 06/25/2018 CLINICAL DATA:  Encephalopathy.  Recent mental status change. EXAM: MRI HEAD WITHOUT CONTRAST TECHNIQUE: Multiplanar, multiecho pulse sequences of the brain and surrounding structures were obtained without intravenous contrast. COMPARISON:  CT head 06/23/2018 FINDINGS: Brain: Acute infarct right frontal lobe involving the frontal operculum. No other acute infarct. Negative for hemorrhage or mass Frontal and temporal lobe atrophy with volume loss and ventricular enlargement. Atrophy has progressed since the prior MRI of 07/14/2009 Vascular: Normal arterial flow void Skull and upper cervical spine: Negative Sinuses/Orbits: Paranasal sinuses clear. Bilateral cataract surgery.  Other: None IMPRESSION: Acute infarct right frontal operculum Progression of atrophy since 2010. Atrophy most prominent in the frontal and temporal lobes bilaterally. Electronically Signed   By: Franchot Gallo M.D.   On: 06/25/2018 14:26    Scheduled Meds: . aspirin EC  81 mg Oral Daily  . busPIRone  15 mg Oral Daily  . busPIRone  30 mg Oral QHS  . insulin aspart  0-9 Units Subcutaneous TID WC  . lactulose  30 g Oral TID  . lurasidone  120 mg Oral Daily  . metoprolol tartrate  25 mg Oral BID  . venlafaxine XR  300 mg Oral Daily   Continuous Infusions: . famotidine (PEPCID) IV 20 mg (06/26/18 1420)  . heparin 850 Units/hr (06/26/18 1413)  . lacosamide (VIMPAT) IV 100 mg (06/26/18 1449)  . levETIRAcetam 1,000 mg (06/26/18 1402)     LOS: 3 days   Time spent: 35 minutes.  Patrecia Pour, MD [facility] [email or web address] Password [email or web address] 06/26/2018, 3:50 PM

## 2018-06-27 DIAGNOSIS — D500 Iron deficiency anemia secondary to blood loss (chronic): Secondary | ICD-10-CM

## 2018-06-27 DIAGNOSIS — K7290 Hepatic failure, unspecified without coma: Secondary | ICD-10-CM

## 2018-06-27 DIAGNOSIS — I480 Paroxysmal atrial fibrillation: Secondary | ICD-10-CM

## 2018-06-27 DIAGNOSIS — K7581 Nonalcoholic steatohepatitis (NASH): Secondary | ICD-10-CM

## 2018-06-27 DIAGNOSIS — R945 Abnormal results of liver function studies: Secondary | ICD-10-CM

## 2018-06-27 LAB — COMPREHENSIVE METABOLIC PANEL
ALT: 75 U/L — ABNORMAL HIGH (ref 0–44)
ANION GAP: 9 (ref 5–15)
AST: 56 U/L — ABNORMAL HIGH (ref 15–41)
Albumin: 3 g/dL — ABNORMAL LOW (ref 3.5–5.0)
Alkaline Phosphatase: 374 U/L — ABNORMAL HIGH (ref 38–126)
BUN: 14 mg/dL (ref 8–23)
CALCIUM: 8.7 mg/dL — AB (ref 8.9–10.3)
CO2: 22 mmol/L (ref 22–32)
Chloride: 114 mmol/L — ABNORMAL HIGH (ref 98–111)
Creatinine, Ser: 0.91 mg/dL (ref 0.44–1.00)
Glucose, Bld: 117 mg/dL — ABNORMAL HIGH (ref 70–99)
Potassium: 4 mmol/L (ref 3.5–5.1)
SODIUM: 145 mmol/L (ref 135–145)
Total Bilirubin: 1.5 mg/dL — ABNORMAL HIGH (ref 0.3–1.2)
Total Protein: 5.2 g/dL — ABNORMAL LOW (ref 6.5–8.1)

## 2018-06-27 LAB — CBC WITH DIFFERENTIAL/PLATELET
BASOS ABS: 0.1 10*3/uL (ref 0.0–0.1)
Basophils Relative: 1 %
EOS ABS: 0.2 10*3/uL (ref 0.0–0.7)
Eosinophils Relative: 2 %
HCT: 32.6 % — ABNORMAL LOW (ref 36.0–46.0)
HEMOGLOBIN: 9.1 g/dL — AB (ref 12.0–15.0)
LYMPHS PCT: 17 %
Lymphs Abs: 1.5 10*3/uL (ref 0.7–4.0)
MCH: 18.8 pg — ABNORMAL LOW (ref 26.0–34.0)
MCHC: 27.9 g/dL — ABNORMAL LOW (ref 30.0–36.0)
MCV: 67.4 fL — ABNORMAL LOW (ref 78.0–100.0)
MONO ABS: 1.1 10*3/uL — AB (ref 0.1–1.0)
Monocytes Relative: 12 %
NEUTROS PCT: 68 %
Neutro Abs: 5.9 10*3/uL (ref 1.7–7.7)
PLATELETS: 441 10*3/uL — AB (ref 150–400)
RBC: 4.84 MIL/uL (ref 3.87–5.11)
RDW: 20.6 % — ABNORMAL HIGH (ref 11.5–15.5)
WBC: 8.8 10*3/uL (ref 4.0–10.5)

## 2018-06-27 LAB — GLUCOSE, CAPILLARY
GLUCOSE-CAPILLARY: 143 mg/dL — AB (ref 70–99)
Glucose-Capillary: 118 mg/dL — ABNORMAL HIGH (ref 70–99)
Glucose-Capillary: 161 mg/dL — ABNORMAL HIGH (ref 70–99)
Glucose-Capillary: 125 mg/dL — ABNORMAL HIGH (ref 70–99)

## 2018-06-27 LAB — HEPARIN LEVEL (UNFRACTIONATED): Heparin Unfractionated: 0.58 IU/mL (ref 0.30–0.70)

## 2018-06-27 LAB — PROTIME-INR
INR: 1.53
PROTHROMBIN TIME: 18.3 s — AB (ref 11.4–15.2)

## 2018-06-27 MED ORDER — LACTULOSE 10 GM/15ML PO SOLN
20.0000 g | Freq: Three times a day (TID) | ORAL | Status: DC
  Administered 2018-06-27 – 2018-06-28 (×4): 20 g via ORAL
  Filled 2018-06-27 (×6): qty 30

## 2018-06-27 NOTE — Progress Notes (Signed)
STROKE TEAM PROGRESS NOTE  INTERVAL HISTORY Her husband and daughter are in the room. She still is aphasic with perseveration. Not in afib on tele. On heparin IV. Passed swallow, on dys 1 and thin liquid.  Vitals:   06/26/18 2351 06/27/18 0332 06/27/18 0500 06/27/18 0750  BP:  105/62  131/87  Pulse:  95  (!) 105  Resp:    18  Temp: 97.7 F (36.5 C) 98.1 F (36.7 C)  97.8 F (36.6 C)  TempSrc: Oral Oral  Oral  SpO2:  98%  99%  Weight:   165 lb 2 oz (74.9 kg)   Height:        CBC:  Recent Labs  Lab 06/25/18 0539 06/27/18 0614  WBC 9.9 8.8  NEUTROABS 7.2 5.9  HGB 9.4* 9.1*  HCT 33.4* 32.6*  MCV 67.7* 67.4*  PLT 558* 441*    Basic Metabolic Panel:  Recent Labs  Lab 06/23/18 0432  06/25/18 0335 06/27/18 0614  NA  --    < > 142 145  K  --    < > 4.1 4.0  CL  --    < > 110 114*  CO2  --    < > 24 22  GLUCOSE  --    < > 136* 117*  BUN  --    < > 12 14  CREATININE  --    < > 0.83 0.91  CALCIUM  --    < > 8.9 8.7*  MG 1.8  --   --   --    < > = values in this interval not displayed.   Lipid Panel:     Component Value Date/Time   CHOL 76 06/25/2018 0335   TRIG 74 06/25/2018 0335   HDL 33 (L) 06/25/2018 0335   CHOLHDL 2.3 06/25/2018 0335   VLDL 15 06/25/2018 0335   LDLCALC 28 06/25/2018 0335   LDLCALC 55 10/22/2017 1012   HgbA1c:  Lab Results  Component Value Date   HGBA1C 6.6 (A) 05/26/2018   Urine Drug Screen:     Component Value Date/Time   LABOPIA NONE DETECTED 06/23/2018 0448   COCAINSCRNUR NONE DETECTED 06/23/2018 0448   LABBENZ NONE DETECTED 06/23/2018 0448   AMPHETMU NONE DETECTED 06/23/2018 0448   THCU NONE DETECTED 06/23/2018 0448   LABBARB (A) 06/23/2018 0448    Result not available. Reagent lot number recalled by manufacturer.    Alcohol Level     Component Value Date/Time   [facility]  07/13/2009 2050    <5        LOWEST DETECTABLE LIMIT FOR SERUM ALCOHOL IS 5 mg/dL FOR MEDICAL PURPOSES ONLY   IMAGING I have personally reviewed the  radiological images below and agree with the radiology interpretations.  Ct Angio Head W Or Wo Contrast Ct Angio Neck W Or Wo Contrast 06/26/2018 IMPRESSION:  Minimal calcified plaque at the carotid bifurcation regions but no stenosis or irregularity. No intracranial large or medium branch vessel occlusion or stenosis. I cannot specifically identify the missing branch vessel responsible for the right posterior frontal infarction, but this is presumably an occluded M3 or distal branch. The patient does in general have distal vessel atherosclerotic narrowing and irregularity.   Ct Head Wo Contrast 06/26/2018 IMPRESSION:  1. The small right middle cerebral artery distribution, posterolateral frontal lobe infarct has evolved since the prior CT scan. It is now seen as a well-defined area of hypoattenuation.  2. No areas of new infarction.  3. No intracranial  hemorrhage.    Mr Brain Wo Contrast 06/25/2018 IMPRESSION:  Acute infarct right frontal operculum Progression of atrophy since 2010. Atrophy most prominent in the frontal and temporal lobes bilaterally.   2D echocardiogram - Left ventricle: The cavity size was mildly dilated. Wall thickness was increased in a pattern of moderate LVH. Systolic function was moderately to severely reduced. The estimated ejection fraction was in the range of 30% to 35%. Severe hypokinesis of the anteroseptal, anterior, inferoseptal, and apical myocardium. - Mitral valve: Mildly to moderately calcified annulus. Mobility was moderately restricted. There was mild to moderate regurgitation. - Left atrium: The atrium was severely dilated. - Right ventricle: The cavity size was moderately dilated. Wall thickness was normal. Systolic function was moderately reduced. - Right atrium: The atrium was moderately to severely dilated. - Pulmonary arteries: Systolic pressure was moderately to severely increased,estimated at approximately 45 mmHg. - Pericardium, extracardiac: A  trivial pericardial effusion was identified.   PHYSICAL EXAM Vitals:   06/26/18 2351 06/27/18 0332 06/27/18 0500 06/27/18 0750  BP:  105/62  131/87  Pulse:  95  (!) 105  Resp:    18  Temp: 97.7 F (36.5 C) 98.1 F (36.7 C)  97.8 F (36.6 C)  TempSrc: Oral Oral  Oral  SpO2:  98%  99%  Weight:   165 lb 2 oz (74.9 kg)   Height:        Pleasant middle-age Caucasian lady who appears not to be in distress. She has intermittent flapping tremor of outstretched upper extremities. . Afebrile. Head is nontraumatic. Neck is supple without bruit.    Cardiac exam no murmur or gallop. Lungs are clear to auscultation. Distal pulses are well felt. Neurological Exam :  She is awake, orientated to her first name, but disoriented to last night or age, then pt became perseverated on her age.  Not cooperate on exam. Diminished attention registration and recall.  Not following most of the commands. PERRL, slow on horizontal gaze, incomplete.  Not blinking to visual threat bilaterally consistently. Mild right lower facial asymmetry. Tongue midline. Motor system exam able to move all 4 extremity against gravity on stimulation. Mild right upper and lower extremity weakness. Deep reflexes symmetric. Plantars are downgoing. Gait not tested.  ASSESSMENT/PLAN Ms. Brandi [name] is a 62 y.o. female with history of HTN, HLD, DB, seizures, breast cancer presenting to [facility] 7/9 w/ nausea, vomiting and abd pain. Dx w/ cirrhosis d/t NASH and developed AF w/ RVR, found CHF w/ EF 30%. Developed increasing confusion. MRI showed R MCA scattered infarct.   Stroke:  right MCA scattered infarct embolic secondary to atrial fibrillation  CT head No acute stroke   MRI - acute right MCA scattered infarcts.    CTA H&N - presumably an occluded M3 or distal branch.  2D Echo - EF 30% to 35%.  EEG mild generalized slowing. No seizure activity  LDL 28  HgbA1c 6.6  SCDs and heparin IV for VTE prophylaxis  aspirin 81 mg  daily prior to admission, now on heparin IV. Consider to change to eliquis after passing swallow  Therapy recommendations:  pending   Disposition:  pending   Atrial Fibrillation with RVR, new dx  Recent hemoglobin dropped from 13 to 11   CHA2DS2-VASc Score = 6, ?2 oral anticoagulation recommended  Age in Years:  <65   0  Sex:  Female   Female   +1    Hypertension History:  yes   +1     Diabetes  Mellitus:  yes   +1  Congestive Heart Failure History:  yes   +1  Vascular Disease History:  0     Stroke/TIA/Thromboembolism History:  yes   +2 . Now converted back to normal sinus rhythm  . Cardiology recommends eliquis once passed swallow . continue Eliquis (apixaban) daily at discharge  Hx Hypertension Hypotension  Systolic blood pressure on the low side, 80s to 100s     Permissive hypertension (OK if < 180/105) but gradually normalize in 5-7 days . Long-term BP goal normotensive  Hyperlipidemia  Home meds: Lipitor 20  LDL 28, goal < 70  Hold lipitor given cirrhosis and low LDL  Diabetes type II  HgbA1c 6.6, at goal < 7.0  Well controlled  SSI  CBG monitoring  Dysphagia  Passed swallow today   On dysphagia diet 1 and thin liquis  Consider switch heparin IV to p.o. Eliquis for stroke prevention  Cardiomyopathy   TTE EF 30-35%  On heparin IV  Will switch to eliquis once passed swallow  Other Stroke Risk Factors  Obesity, Body mass index is 32.25 kg/m., recommend weight loss, diet and exercise as appropriate   Family hx stroke (maternal grandmother)  Other Active Problems  Cirrhosis due to NASH  Acute encephalopathy, hepatic with elevated ammonia and asterixis .  On lactulose   Breast cancer Truddie Coco )  Seizure d/o on Keppra and Vimpat  GERD  Anxiety/depression - on buspar and effexor  Schizophrenia on [facility] day # 4  Neurology will sign off. Please call with questions. Pt will follow up with stroke clinic NP at [facility] in about 4  weeks. Thanks for the consult.  Rosalin Hawking, MD PhD Stroke Neurology 06/27/2018 6:26 PM   To contact Stroke Continuity provider, please refer to [email or web address] After hours, contact General Neurology

## 2018-06-27 NOTE — Progress Notes (Addendum)
[facility] Progress Note Covering for Drs. Adriana Mccallum   Chief Complaint:   Cirrhosis, anemia    SUBJECTIVE:    no complaints voiced. Family in room  ASSESSMENT AND PLAN:   1. NASH cirrhosis / hepatic encephalopathy.  -Lactulose enemas changed to PO since cleared by SLP for PO. Make need to add Xifaxan depending on how she does -Should have varices screening at some point if / when stable, especially given need for anticoagulation. No suggestion of varices on recent CT scan.    2. New onset AFIB with RVR / CVA, anticoagulation has been started. Currently on heparin gtt.   3. Chronic (stable) anemia. Hgb 9.1. She is iron deficient.  -This will need to be further evaluated when more medically stable.  Her last colonoscopy was incomplete. Benefits of anticoagulation outweigh risks at this point  4. Abnormal liver chemistries, mixed pattern. Shock liver felt most likely. Improving.   5. Unusual tongue movements, ? Medication related.   OBJECTIVE:     Vital signs in last 24 hours: Temp:  [96.9 F (36.1 C)-98.1 F (36.7 C)] 97.8 F (36.6 C) (07/13 0750) Pulse Rate:  [94-105] 105 (07/13 0750) Resp:  [18-27] 18 (07/13 0750) BP: (105-131)/(62-92) 131/87 (07/13 0750) SpO2:  [98 %-100 %] 99 % (07/13 0750) Weight:  [165 lb 2 oz (74.9 kg)] 165 lb 2 oz (74.9 kg) (07/13 0500) Last BM Date: 06/26/18 General:   White female lying in bed in NAD Heart:  Regular rate, irreg rhythm, no lower extremity edema Pulm: Normal respiratory effo Abdomen:  Soft, nondistended, nontender.  Normal bowel sounds, no masses felt.     Neurologic:  Follows some commands as much as physically able. Doesn't answer questions. Unusual movement with tongue Psych:  Pleasant, cooperative.  Normal mood and affect.   Intake/Output from previous day: 07/12 0701 - 07/13 0700 In: 892.3 [I.V.:151.1; IV Piggyback:741.2] Out: -  Intake/Output this shift: Total I/O In: 10 [I.V.:10] Out: -    Lab Results: Recent Labs    06/25/18 0539 06/27/18 0614  WBC 9.9 8.8  HGB 9.4* 9.1*  HCT 33.4* 32.6*  PLT 558* 441*   BMET Recent Labs    06/25/18 0335 06/27/18 0614  NA 142 145  K 4.1 4.0  CL 110 114*  CO2 24 22  GLUCOSE 136* 117*  BUN 12 14  CREATININE 0.83 0.91  CALCIUM 8.9 8.7*   LFT Recent Labs    06/27/18 0614  PROT 5.2*  ALBUMIN 3.0*  AST 56*  ALT 75*  ALKPHOS 374*  BILITOT 1.5*   PT/INR Recent Labs    06/25/18 0335 06/27/18 0614  LABPROT 16.5* 18.3*  INR 1.35 1.53   Hepatitis Panel No results for input(s): HEPBSAG, HCVAB, HEPAIGM, HEPBIGM in the last 72 hours.  Ct Angio Head W Or Wo Contrast  Result Date: 06/26/2018 CLINICAL DATA:  Right frontal infarction. EXAM: CT ANGIOGRAPHY HEAD AND NECK TECHNIQUE: Multidetector CT imaging of the head and neck was performed using the standard protocol during bolus administration of intravenous contrast. Multiplanar CT image reconstructions and MIPs were obtained to evaluate the vascular anatomy. Carotid stenosis measurements (when applicable) are obtained utilizing NASCET criteria, using the distal internal carotid diameter as the denominator. CONTRAST:  68mL ISOVUE-370 IOPAMIDOL (ISOVUE-370) INJECTION 76% COMPARISON:  CT same day.  MRI 06/25/2018. FINDINGS: CTA NECK FINDINGS Aortic arch: Mild atherosclerosis of the arch. No aneurysm or dissection. Branching pattern of the brachiocephalic vessels from the arch is normal. Right  carotid system: Common carotid artery widely patent to the bifurcation. Minimal atherosclerotic calcification at the ICA bulb but no stenosis or irregularity. Cervical ICA is widely patent. Left carotid system: Common carotid artery is widely patent to the bifurcation. Carotid bifurcation is widely patent. There is moderate atherosclerotic calcification of the ICA bulb but no stenosis or irregularity. Cervical ICA is widely patent. Vertebral arteries: Left vertebral artery is dominant. Both  vertebral artery origins are widely patent. Both vertebral arteries are widely patent through the cervical region to the foramen magnum. Skeleton: Ordinary cervical spondylosis. Other neck: No mass or lymphadenopathy. Upper chest: Moderate bilateral effusions layering dependently with dependent pulmonary atelectasis. Review of the MIP images confirms the above findings CTA HEAD FINDINGS Anterior circulation: Both internal carotid arteries are patent through the skull base and siphon regions. There is atherosclerotic calcification of the carotid siphons but no focal stenosis. The anterior and middle cerebral vessels are patent without proximal stenosis, aneurysm or vascular malformation. In general, there is distal vessel narrowing and irregularity. Posterior circulation: Both vertebral arteries are patent to the basilar. No basilar stenosis. Posterior circulation branch vessels are patent and normal. Venous sinuses: Patent and normal. Anatomic variants: None significant. Delayed phase: No abnormal enhancement. Review of the MIP images confirms the above findings IMPRESSION: Minimal calcified plaque at the carotid bifurcation regions but no stenosis or irregularity. No intracranial large or medium branch vessel occlusion or stenosis. I cannot specifically identify the missing branch vessel responsible for the right posterior frontal infarction, but this is presumably an occluded M3 or distal branch. The patient does in general have distal vessel atherosclerotic narrowing and irregularity. Electronically Signed   By: Nelson Chimes M.D.   On: 06/26/2018 19:40   Ct Head Wo Contrast  Result Date: 06/26/2018 CLINICAL DATA:  Evaluate for cerebral hemorrhage.  Recent stroke. EXAM: CT HEAD WITHOUT CONTRAST TECHNIQUE: Contiguous axial images were obtained from the base of the skull through the vertex without intravenous contrast. COMPARISON:  Brain MRI, 06/25/2018.  Head CT, 06/23/2018. FINDINGS: Brain: Subacute right  lateral frontal lobe infarct is now well-defined on CT. No new areas of infarction since the prior brain MRI. No parenchymal or extra-axial masses. No midline shift. No hydrocephalus. No intracranial hemorrhage. Ventricular and sulcal enlargement is noted reflecting atrophy advanced for age. Vascular: No hyperdense vessel or unexpected calcification. Skull: Normal. Negative for fracture or focal lesion. Sinuses/Orbits: Globes and orbits are unremarkable. Sinuses and mastoid air cells are clear. Other: None. IMPRESSION: 1. The small right middle cerebral artery distribution, posterolateral frontal lobe infarct has evolved since the prior CT scan. It is now seen as a well-defined area of hypoattenuation. 2. No areas of new infarction. 3. No intracranial hemorrhage. Electronically Signed   By: Lajean Manes M.D.   On: 06/26/2018 19:33   Ct Angio Neck W Or Wo Contrast  Result Date: 06/26/2018 CLINICAL DATA:  Right frontal infarction. EXAM: CT ANGIOGRAPHY HEAD AND NECK TECHNIQUE: Multidetector CT imaging of the head and neck was performed using the standard protocol during bolus administration of intravenous contrast. Multiplanar CT image reconstructions and MIPs were obtained to evaluate the vascular anatomy. Carotid stenosis measurements (when applicable) are obtained utilizing NASCET criteria, using the distal internal carotid diameter as the denominator. CONTRAST:  39mL ISOVUE-370 IOPAMIDOL (ISOVUE-370) INJECTION 76% COMPARISON:  CT same day.  MRI 06/25/2018. FINDINGS: CTA NECK FINDINGS Aortic arch: Mild atherosclerosis of the arch. No aneurysm or dissection. Branching pattern of the brachiocephalic vessels from the arch is normal. Right  carotid system: Common carotid artery widely patent to the bifurcation. Minimal atherosclerotic calcification at the ICA bulb but no stenosis or irregularity. Cervical ICA is widely patent. Left carotid system: Common carotid artery is widely patent to the bifurcation. Carotid  bifurcation is widely patent. There is moderate atherosclerotic calcification of the ICA bulb but no stenosis or irregularity. Cervical ICA is widely patent. Vertebral arteries: Left vertebral artery is dominant. Both vertebral artery origins are widely patent. Both vertebral arteries are widely patent through the cervical region to the foramen magnum. Skeleton: Ordinary cervical spondylosis. Other neck: No mass or lymphadenopathy. Upper chest: Moderate bilateral effusions layering dependently with dependent pulmonary atelectasis. Review of the MIP images confirms the above findings CTA HEAD FINDINGS Anterior circulation: Both internal carotid arteries are patent through the skull base and siphon regions. There is atherosclerotic calcification of the carotid siphons but no focal stenosis. The anterior and middle cerebral vessels are patent without proximal stenosis, aneurysm or vascular malformation. In general, there is distal vessel narrowing and irregularity. Posterior circulation: Both vertebral arteries are patent to the basilar. No basilar stenosis. Posterior circulation branch vessels are patent and normal. Venous sinuses: Patent and normal. Anatomic variants: None significant. Delayed phase: No abnormal enhancement. Review of the MIP images confirms the above findings IMPRESSION: Minimal calcified plaque at the carotid bifurcation regions but no stenosis or irregularity. No intracranial large or medium branch vessel occlusion or stenosis. I cannot specifically identify the missing branch vessel responsible for the right posterior frontal infarction, but this is presumably an occluded M3 or distal branch. The patient does in general have distal vessel atherosclerotic narrowing and irregularity. Electronically Signed   By: Nelson Chimes M.D.   On: 06/26/2018 19:40   Mr Brain Wo Contrast  Result Date: 06/25/2018 CLINICAL DATA:  Encephalopathy.  Recent mental status change. EXAM: MRI HEAD WITHOUT CONTRAST  TECHNIQUE: Multiplanar, multiecho pulse sequences of the brain and surrounding structures were obtained without intravenous contrast. COMPARISON:  CT head 06/23/2018 FINDINGS: Brain: Acute infarct right frontal lobe involving the frontal operculum. No other acute infarct. Negative for hemorrhage or mass Frontal and temporal lobe atrophy with volume loss and ventricular enlargement. Atrophy has progressed since the prior MRI of 07/14/2009 Vascular: Normal arterial flow void Skull and upper cervical spine: Negative Sinuses/Orbits: Paranasal sinuses clear. Bilateral cataract surgery. Other: None IMPRESSION: Acute infarct right frontal operculum Progression of atrophy since 2010. Atrophy most prominent in the frontal and temporal lobes bilaterally. Electronically Signed   By: Franchot Gallo M.D.   On: 06/25/2018 14:26      Principal Problem:   Atrial fibrillation with RVR (HCC) Active Problems:   Major depression   Migraine headache   GERD (gastroesophageal reflux disease)   History of breast cancer in female   Hyperlipidemia associated with type 2 diabetes mellitus (HCC)   Leukocytosis   Type 2 diabetes mellitus without complication, without long-term current use of insulin ([place])   Hypertension associated with diabetes ([place])   Anemia   Elevated LFTs   Malaise   Schizophrenia ([place])   History of seizures   Nausea and vomiting   Cerebral embolism with cerebral infarction     LOS: 4 days   Tye Savoy ,NP 06/27/2018, 1:27 PM    Attending physician's note   I have taken an interval history, reviewed the chart and examined the patient. I agree with the Advanced Practitioner's note, impression and recommendations.   Damaris Hippo , MD [phone]

## 2018-06-27 NOTE — Progress Notes (Signed)
Progress Note  Patient Name: Brandi [name] Date of Encounter: 06/27/2018  Primary Cardiologist: Mertie Moores, MD   Subjective   Somnolent with no complaints  Inpatient Medications    Scheduled Meds: . busPIRone  15 mg Oral Daily  . busPIRone  30 mg Oral QHS  . insulin aspart  0-9 Units Subcutaneous TID WC  . lactulose  300 mL Rectal BID  . metoprolol tartrate  2.5 mg Intravenous Q8H  . venlafaxine XR  300 mg Oral Daily   Continuous Infusions: . famotidine (PEPCID) IV 20 mg (06/27/18 1120)  . heparin 1,000 Units/hr (06/27/18 0700)  . lacosamide (VIMPAT) IV 100 mg (06/27/18 1034)  . levETIRAcetam 1,000 mg (06/27/18 1200)   PRN Meds: gi cocktail, HYDROcodone-acetaminophen, LORazepam, ondansetron **OR** ondansetron (ZOFRAN) IV, senna-docusate   Vital Signs    Vitals:   06/26/18 2351 06/27/18 0332 06/27/18 0500 06/27/18 0750  BP:  105/62  131/87  Pulse:  95  (!) 105  Resp:    18  Temp: 97.7 F (36.5 C) 98.1 F (36.7 C)  97.8 F (36.6 C)  TempSrc: Oral Oral  Oral  SpO2:  98%  99%  Weight:   165 lb 2 oz (74.9 kg)   Height:        Intake/Output Summary (Last 24 hours) at 06/27/2018 1255 Last data filed at 06/27/2018 0800 Gross per 24 hour  Intake 902.24 ml  Output -  Net 902.24 ml   Filed Weights   06/25/18 0500 06/26/18 0700 06/27/18 0500  Weight: 171 lb 8.3 oz (77.8 kg) 166 lb 0.1 oz (75.3 kg) 165 lb 2 oz (74.9 kg)    Telemetry    Atrial fib with a CVR/RVR - Personally Reviewed  ECG    none - Personally Reviewed  Physical Exam   GEN: No acute distress.  Somnolent.  Neck: 6 cm JVD Cardiac: IRIRR, no murmurs, rubs, or gallops.  Respiratory: Clear to auscultation bilaterally. GI: Soft, nontender, non-distended  MS: No edema; No deformity. Neuro:  Nonfocal  Psych: Normal affect   Labs    Chemistry Recent Labs  Lab 06/24/18 0312 06/25/18 0335 06/27/18 0614  NA 139 142 145  K 3.5 4.1 4.0  CL 107 110 114*  CO2 24 24 22   GLUCOSE 130*  136* 117*  BUN 12 12 14   CREATININE 0.75 0.83 0.91  CALCIUM 8.3* 8.9 8.7*  PROT 5.7* 5.6* 5.2*  ALBUMIN 3.2* 3.2* 3.0*  AST 47* 51* 56*  ALT 89* 80* 75*  ALKPHOS 223* 258* 374*  BILITOT 1.4* 1.0 1.5*  GFRNONAA >60 >60 >60  GFRAA >60 >60 >60  ANIONGAP 8 8 9      Hematology Recent Labs  Lab 06/24/18 0312 06/25/18 0539 06/27/18 0614  WBC 10.1 9.9 8.8  RBC 4.88 4.93 4.84  HGB 9.4* 9.4* 9.1*  HCT 32.9* 33.4* 32.6*  MCV 67.4* 67.7* 67.4*  MCH 19.3* 19.1* 18.8*  MCHC 28.6* 28.1* 27.9*  RDW 19.6* 19.8* 20.6*  PLT 544* 558* 441*    Cardiac EnzymesNo results for input(s): TROPONINI in the last 168 hours.  Recent Labs  Lab 06/23/18 0437  TROPIPOC 0.02     BNP Recent Labs  Lab 06/23/18 0119 06/23/18 0929  BNP 689.0* 607.3*     DDimer No results for input(s): DDIMER in the last 168 hours.   Radiology    Ct Angio Head W Or Wo Contrast  Result Date: 06/26/2018 CLINICAL DATA:  Right frontal infarction. EXAM: CT ANGIOGRAPHY HEAD AND NECK TECHNIQUE:  Multidetector CT imaging of the head and neck was performed using the standard protocol during bolus administration of intravenous contrast. Multiplanar CT image reconstructions and MIPs were obtained to evaluate the vascular anatomy. Carotid stenosis measurements (when applicable) are obtained utilizing NASCET criteria, using the distal internal carotid diameter as the denominator. CONTRAST:  26mL ISOVUE-370 IOPAMIDOL (ISOVUE-370) INJECTION 76% COMPARISON:  CT same day.  MRI 06/25/2018. FINDINGS: CTA NECK FINDINGS Aortic arch: Mild atherosclerosis of the arch. No aneurysm or dissection. Branching pattern of the brachiocephalic vessels from the arch is normal. Right carotid system: Common carotid artery widely patent to the bifurcation. Minimal atherosclerotic calcification at the ICA bulb but no stenosis or irregularity. Cervical ICA is widely patent. Left carotid system: Common carotid artery is widely patent to the bifurcation.  Carotid bifurcation is widely patent. There is moderate atherosclerotic calcification of the ICA bulb but no stenosis or irregularity. Cervical ICA is widely patent. Vertebral arteries: Left vertebral artery is dominant. Both vertebral artery origins are widely patent. Both vertebral arteries are widely patent through the cervical region to the foramen magnum. Skeleton: Ordinary cervical spondylosis. Other neck: No mass or lymphadenopathy. Upper chest: Moderate bilateral effusions layering dependently with dependent pulmonary atelectasis. Review of the MIP images confirms the above findings CTA HEAD FINDINGS Anterior circulation: Both internal carotid arteries are patent through the skull base and siphon regions. There is atherosclerotic calcification of the carotid siphons but no focal stenosis. The anterior and middle cerebral vessels are patent without proximal stenosis, aneurysm or vascular malformation. In general, there is distal vessel narrowing and irregularity. Posterior circulation: Both vertebral arteries are patent to the basilar. No basilar stenosis. Posterior circulation branch vessels are patent and normal. Venous sinuses: Patent and normal. Anatomic variants: None significant. Delayed phase: No abnormal enhancement. Review of the MIP images confirms the above findings IMPRESSION: Minimal calcified plaque at the carotid bifurcation regions but no stenosis or irregularity. No intracranial large or medium branch vessel occlusion or stenosis. I cannot specifically identify the missing branch vessel responsible for the right posterior frontal infarction, but this is presumably an occluded M3 or distal branch. The patient does in general have distal vessel atherosclerotic narrowing and irregularity. Electronically Signed   By: Nelson Chimes M.D.   On: 06/26/2018 19:40   Ct Head Wo Contrast  Result Date: 06/26/2018 CLINICAL DATA:  Evaluate for cerebral hemorrhage.  Recent stroke. EXAM: CT HEAD WITHOUT  CONTRAST TECHNIQUE: Contiguous axial images were obtained from the base of the skull through the vertex without intravenous contrast. COMPARISON:  Brain MRI, 06/25/2018.  Head CT, 06/23/2018. FINDINGS: Brain: Subacute right lateral frontal lobe infarct is now well-defined on CT. No new areas of infarction since the prior brain MRI. No parenchymal or extra-axial masses. No midline shift. No hydrocephalus. No intracranial hemorrhage. Ventricular and sulcal enlargement is noted reflecting atrophy advanced for age. Vascular: No hyperdense vessel or unexpected calcification. Skull: Normal. Negative for fracture or focal lesion. Sinuses/Orbits: Globes and orbits are unremarkable. Sinuses and mastoid air cells are clear. Other: None. IMPRESSION: 1. The small right middle cerebral artery distribution, posterolateral frontal lobe infarct has evolved since the prior CT scan. It is now seen as a well-defined area of hypoattenuation. 2. No areas of new infarction. 3. No intracranial hemorrhage. Electronically Signed   By: Lajean Manes M.D.   On: 06/26/2018 19:33   Ct Angio Neck W Or Wo Contrast  Result Date: 06/26/2018 CLINICAL DATA:  Right frontal infarction. EXAM: CT ANGIOGRAPHY HEAD AND NECK TECHNIQUE:  Multidetector CT imaging of the head and neck was performed using the standard protocol during bolus administration of intravenous contrast. Multiplanar CT image reconstructions and MIPs were obtained to evaluate the vascular anatomy. Carotid stenosis measurements (when applicable) are obtained utilizing NASCET criteria, using the distal internal carotid diameter as the denominator. CONTRAST:  37mL ISOVUE-370 IOPAMIDOL (ISOVUE-370) INJECTION 76% COMPARISON:  CT same day.  MRI 06/25/2018. FINDINGS: CTA NECK FINDINGS Aortic arch: Mild atherosclerosis of the arch. No aneurysm or dissection. Branching pattern of the brachiocephalic vessels from the arch is normal. Right carotid system: Common carotid artery widely patent to  the bifurcation. Minimal atherosclerotic calcification at the ICA bulb but no stenosis or irregularity. Cervical ICA is widely patent. Left carotid system: Common carotid artery is widely patent to the bifurcation. Carotid bifurcation is widely patent. There is moderate atherosclerotic calcification of the ICA bulb but no stenosis or irregularity. Cervical ICA is widely patent. Vertebral arteries: Left vertebral artery is dominant. Both vertebral artery origins are widely patent. Both vertebral arteries are widely patent through the cervical region to the foramen magnum. Skeleton: Ordinary cervical spondylosis. Other neck: No mass or lymphadenopathy. Upper chest: Moderate bilateral effusions layering dependently with dependent pulmonary atelectasis. Review of the MIP images confirms the above findings CTA HEAD FINDINGS Anterior circulation: Both internal carotid arteries are patent through the skull base and siphon regions. There is atherosclerotic calcification of the carotid siphons but no focal stenosis. The anterior and middle cerebral vessels are patent without proximal stenosis, aneurysm or vascular malformation. In general, there is distal vessel narrowing and irregularity. Posterior circulation: Both vertebral arteries are patent to the basilar. No basilar stenosis. Posterior circulation branch vessels are patent and normal. Venous sinuses: Patent and normal. Anatomic variants: None significant. Delayed phase: No abnormal enhancement. Review of the MIP images confirms the above findings IMPRESSION: Minimal calcified plaque at the carotid bifurcation regions but no stenosis or irregularity. No intracranial large or medium branch vessel occlusion or stenosis. I cannot specifically identify the missing branch vessel responsible for the right posterior frontal infarction, but this is presumably an occluded M3 or distal branch. The patient does in general have distal vessel atherosclerotic narrowing and  irregularity. Electronically Signed   By: Nelson Chimes M.D.   On: 06/26/2018 19:40   Mr Brain Wo Contrast  Result Date: 06/25/2018 CLINICAL DATA:  Encephalopathy.  Recent mental status change. EXAM: MRI HEAD WITHOUT CONTRAST TECHNIQUE: Multiplanar, multiecho pulse sequences of the brain and surrounding structures were obtained without intravenous contrast. COMPARISON:  CT head 06/23/2018 FINDINGS: Brain: Acute infarct right frontal lobe involving the frontal operculum. No other acute infarct. Negative for hemorrhage or mass Frontal and temporal lobe atrophy with volume loss and ventricular enlargement. Atrophy has progressed since the prior MRI of 07/14/2009 Vascular: Normal arterial flow void Skull and upper cervical spine: Negative Sinuses/Orbits: Paranasal sinuses clear. Bilateral cataract surgery. Other: None IMPRESSION: Acute infarct right frontal operculum Progression of atrophy since 2010. Atrophy most prominent in the frontal and temporal lobes bilaterally. Electronically Signed   By: Franchot Gallo M.D.   On: 06/25/2018 14:26    Cardiac Studies   Echo reviewed  Patient Profile     62 y.o. female admitted with stroke, new onset atrial fib, new diagnosis of systolic heart failure. She has been started on IV heparin.  Assessment & Plan    1. Atrial fib - she has been started on IV heparin. Her rates are reasonably well controlled. She is back in atrial fib.  Elliquis when patient able to take po.  2. Acute systolic heart failure - she is euvolemic. We will follow. 3. Somnolence - suspect multifactorial. She will need another CT scan if no neuro improvement. I do not know her baseline.     For questions or updates, please contact [place] Please consult [email or web address] for contact info under Cardiology/STEMI.      Signed, Cristopher Peru, MD  06/27/2018, 12:55 PM  Patient ID: Brandi [name], female   DOB: 03/12/1956, 62 y.o.   MRN: [id number]

## 2018-06-27 NOTE — NC FL2 (Signed)
[place] MEDICAID FL2 LEVEL OF CARE SCREENING TOOL     IDENTIFICATION  Patient Name: Brandi [name] Birthdate: March 08, 1956 Sex: female Admission Date (Current Location): 06/23/2018  [facility] and [place] Number:  Herbalist and Address:  [facility], [place] [address] [place], [place] [address]      Provider Number: [id number]  Attending Physician Name and Address:  Patrecia Pour, MD  Relative Name and Phone Number:  Reeshemah Nazaryan, husband, [phone]    Current Level of Care: Hospital Recommended Level of Care: [place] Prior Approval Number:    Date Approved/Denied:   PASRR Number: pending  Discharge Plan: SNF    Current Diagnoses: Patient Active Problem List   Diagnosis Date Noted  . Cerebral embolism with cerebral infarction 06/26/2018  . Atrial fibrillation with RVR ([place]) 06/23/2018  . Schizophrenia ([place]) 06/23/2018  . History of seizures 06/23/2018  . Nausea and vomiting 06/23/2018  . SOB (shortness of breath) 06/12/2018  . Abnormal EKG 06/12/2018  . Elevated LFTs 06/12/2018  . Malaise 06/12/2018  . Hypertension associated with diabetes ([place]) 12/05/2016  . Anemia 12/05/2016  . Convulsions/seizures ([place]) 11/02/2014  . Type 2 diabetes mellitus without complication, without long-term current use of insulin ([place]) 03/16/2014  . Leukocytosis 06/29/2013  . Thrombocytosis ([place]) 06/29/2013  . Major depression 09/10/2011  . Migraine headache 09/10/2011  . Obesity (BMI 30-39.9) 09/10/2011  . GERD (gastroesophageal reflux disease) 09/10/2011  . Allergic rhinitis due to pollen 09/10/2011  . History of breast cancer in female 09/10/2011  . Hyperlipidemia associated with type 2 diabetes mellitus ([place]) 09/10/2011    Orientation RESPIRATION BLADDER Height & Weight     Self  Normal(intermittant nasal canula 2L) Incontinent Weight: 165 lb 2 oz (74.9 kg) Height:  5' (152.4 cm)  BEHAVIORAL SYMPTOMS/MOOD NEUROLOGICAL  BOWEL NUTRITION STATUS    Convulsions/Seizures(history of ) Continent Diet(see discharge summary)  AMBULATORY STATUS COMMUNICATION OF NEEDS Skin   Extensive Assist Verbally Normal                       Personal Care Assistance Level of Assistance  Bathing, Feeding, Dressing Bathing Assistance: Maximum assistance Feeding assistance: Limited assistance Dressing Assistance: Maximum assistance     Functional Limitations Info  Hearing, Sight, Speech Sight Info: Adequate Hearing Info: Adequate Speech Info: Impaired    SPECIAL CARE FACTORS FREQUENCY  PT (By licensed PT), OT (By licensed OT)     PT Frequency: 5x week OT Frequency: 5x week            Contractures Contractures Info: Not present    Additional Factors Info  Code Status, Allergies, Psychotropic, Insulin Sliding Scale Code Status Info: Full Code Allergies Info: Penicillins Psychotropic Info: venlafaxine XR (EFFEXOR-XR) 24 hr capsule 300 mg daily PO; lurasidone (LATUDA) tablet 120 mg daily PO; busPIRone (BUSPAR) tablet 15 mg daily PO; busPIRone (BUSPAR) tablet 30 mg daily at bedtime Insulin Sliding Scale Info: insulin aspart (novoLOG) injection 0-9 Units 3x daily with meals       Current Medications (06/27/2018):  This is the current hospital active medication list Current Facility-Administered Medications  Medication Dose Route Frequency Provider Last Rate Last Dose  . busPIRone (BUSPAR) tablet 15 mg  15 mg Oral Daily Rondel Jumbo, PA-C   15 mg at 06/25/18 1052  . busPIRone (BUSPAR) tablet 30 mg  30 mg Oral QHS Rise Patience, MD   Stopped at 06/25/18 2258  . famotidine (PEPCID) IVPB  20 mg premix  20 mg Intravenous Q12H Rondel Jumbo, PA-C 100 mL/hr at 06/26/18 2216 20 mg at 06/26/18 2216  . gi cocktail (Maalox,Lidocaine,Donnatal)  30 mL Oral TID PRN Rondel Jumbo, PA-C      . heparin ADULT infusion 100 units/mL (25000 units/222mL sodium chloride 0.45%)  1,000 Units/hr Intravenous Continuous Patrecia Pour, MD 10 mL/hr at 06/27/18 0700 1,000 Units/hr at 06/27/18 0700  . HYDROcodone-acetaminophen (NORCO/VICODIN) 5-325 MG per tablet 1 tablet  1 tablet Oral Q8H PRN Alma Friendly, MD      . insulin aspart (novoLOG) injection 0-9 Units  0-9 Units Subcutaneous TID WC Rondel Jumbo, PA-C   1 Units at 06/25/18 1655  . lacosamide (VIMPAT) 100 mg in sodium chloride 0.9 % 25 mL IVPB  100 mg Intravenous Q12H Aroor, Lanice Schwab, MD 70 mL/hr at 06/26/18 2117 100 mg at 06/26/18 2117  . lactulose (CHRONULAC) enema 200 gm  300 mL Rectal BID Patrecia Pour, MD   300 mL at 06/26/18 2306  . levETIRAcetam (KEPPRA) IVPB 1000 mg/100 mL premix  1,000 mg Intravenous Q12H Aroor, Karena Addison R, MD 400 mL/hr at 06/27/18 0035 1,000 mg at 06/27/18 0035  . LORazepam (ATIVAN) tablet 0.5 mg  0.5 mg Oral QHS PRN Alma Friendly, MD      . lurasidone (LATUDA) tablet 120 mg  120 mg Oral Daily Sharene Butters E, PA-C   120 mg at 06/25/18 1046  . metoprolol tartrate (LOPRESSOR) injection 2.5 mg  2.5 mg Intravenous Q8H Vance Gather B, MD   2.5 mg at 06/27/18 0547  . ondansetron (ZOFRAN) tablet 4 mg  4 mg Oral Q6H PRN Rondel Jumbo, PA-C       Or  . ondansetron [place]) injection 4 mg  4 mg Intravenous Q6H PRN Rondel Jumbo, PA-C      . senna-docusate (Senokot-S) tablet 1 tablet  1 tablet Oral QHS PRN Rondel Jumbo, PA-C      . venlafaxine XR (EFFEXOR-XR) 24 hr capsule 300 mg  300 mg Oral Daily Rondel Jumbo, PA-C   300 mg at 06/25/18 1048     Discharge Medications: Please see discharge summary for a list of discharge medications.  Relevant Imaging Results:  Relevant Lab Results:   Additional Information SS# [place], [place]

## 2018-06-27 NOTE — Progress Notes (Signed)
  Speech Language Pathology Treatment: Dysphagia  Patient Details Name: Brandi [name] MRN: [id number] DOB: December 17, 1955 Today's Date: 06/27/2018 Time: 8916-9450 SLP Time Calculation (min) (ACUTE ONLY): 26 min  Assessment / Plan / Recommendation Clinical Impression  Pt with improved mentation today after awoken by this SLP.  Observed her with intake of ice, applejuice, applesauce with meds *with RN* and graham cracker.  Pt required total cues to attempt solids - and then orally held with gagging noted during swallow attempts.  SLP helped to remove cracker with tissue.  Note pt with lingual thrusting to the right and jaw closing tightly intermittently during evaluation.  In addition, pt did not follow directions involving oral musculature -= ? Motor planning deficits.  No indication of airway compromise.  Will follow up for readiness for dietary advancement from puree/thin -and to establish baseline status with family.    HPI HPI: 62 year old female admitted 06/23/18 with nausea, decreased po intake. PMH: HTN, HLD, depression, schizophrenia, Left breast CA, GERD, DM. MRI = acute infarct right frontal operculum      SLP Plan  Continue with current plan of care       Recommendations  Diet recommendations: Dysphagia 1 (puree);Thin liquid Liquids provided via: Cup;Straw Medication Administration: Whole meds with puree Supervision: Full supervision/cueing for compensatory strategies Compensations: Slow rate;Small sips/bites;Other (Comment);Minimize environmental distractions(check for oral residuals) Postural Changes and/or Swallow Maneuvers: Seated upright 90 degrees;Upright 30-60 min after meal                Oral Care Recommendations: Oral care QID Follow up Recommendations: (tbd) SLP Visit Diagnosis: Dysphagia, oral phase (R13.11) Plan: Continue with current plan of care       GO               Luanna Salk, [facility] SLP [phone]  Macario Golds 06/27/2018, 11:03  AM

## 2018-06-27 NOTE — Progress Notes (Signed)
[facility] [place] for Heparin Indication: atrial fibrillation and stroke  Allergies  Allergen Reactions  . Penicillins Rash    Has patient had a PCN reaction causing immediate rash, facial/tongue/throat swelling, SOB or lightheadedness with hypotension: No Has patient had a PCN reaction causing severe rash involving mucus membranes or skin necrosis: No Has patient had a PCN reaction that required hospitalization No Has patient had a PCN reaction occurring within the last 10 years: No If all of the above answers are "NO", then may proceed with Cephalosporin use.     Patient Measurements: Height: 5' (152.4 cm) Weight: 165 lb 2 oz (74.9 kg) IBW/kg (Calculated) : 45.5 Heparin Dosing Weight: 62 kg  Vital Signs: Temp: 97.8 F (36.6 C) (07/13 0750) Temp Source: Oral (07/13 0750) BP: 124/84 (07/13 1350) Pulse Rate: 105 (07/13 0750)  Labs: Recent Labs    06/25/18 0335 06/25/18 0539 06/26/18 2048 06/27/18 0614  HGB  --  9.4*  --  9.1*  HCT  --  33.4*  --  32.6*  PLT  --  558*  --  441*  LABPROT 16.5*  --   --  18.3*  INR 1.35  --   --  1.53  HEPARINUNFRC  --   --  <0.10* 0.58  CREATININE 0.83  --   --  0.91    Estimated Creatinine Clearance: 58 mL/min (by C-G formula based on SCr of 0.91 mg/dL).   Medical History: Past Medical History:  Diagnosis Date  . Allergic rhinitis   . Anemia   . Arthritis HX RIGHT ANKLE FX  . Cancer [facility])    breast, lumpectomy  . Depression    MAJOR  . Dyslipidemia   . GERD (gastroesophageal reflux disease)   . History of breast cancer DX 2010--  S/P LUMPECTOMY AND RADIATION -- NO RECURRENCE   ONCOLOGIST- DR Truddie Coco  . Hypertension   . Left flank pain   . Left ureteral calculus   . Migraines   . Schizophrenic disorder ([place])   . 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 Type II or unspecified type diabetes mellitus without mention of complication, not stated as uncontrolled 03/16/2014     Medications:  Scheduled:  . busPIRone  15 mg Oral Daily  . busPIRone  30 mg Oral QHS  . insulin aspart  0-9 Units Subcutaneous TID WC  . lactulose  20 g Oral TID  . metoprolol tartrate  2.5 mg Intravenous Q8H  . venlafaxine XR  300 mg Oral Daily   Infusions:  . famotidine (PEPCID) IV 20 mg (06/27/18 1120)  . heparin 1,000 Units/hr (06/27/18 1452)  . lacosamide (VIMPAT) IV 100 mg (06/27/18 1034)  . levETIRAcetam 1,000 mg (06/27/18 1200)    Assessment: 62 yo F [facility] on [facility]9 with nausea, poor oral intake, new onset afib, abnormal LFTs, and anemia.  Pt noted with increased confusion 7/11 and MRI was completed which showed CVA.  Pt was transferred to [facility] for stroke eval.  Pt did not receive tPA due to unknown last seen normal time.  Pt has been cleared by GI and neuro to start anticoagulation with heparin. Pharmacy consulted to dose heparin.   Heparin level today is supratherapeutic at 0.58 CBCs and platelets are stable.   Goal of Therapy:  Heparin level 0.3-0.5 units/ml Monitor platelets by anticoagulation protocol: Yes   Plan:  Decrease heparin infusion to 900 units/hr. Heparin level with AM labs. Heparin level  and CBC daily while on heparin. Follow-up with switch to oral anticoagulation.   Gwenlyn Found, Sherian Rein D PGY1 Pharmacy Resident  Phone [phone] 06/27/2018   4:06 PM

## 2018-06-27 NOTE — Plan of Care (Signed)
  Problem: Health Behavior/Discharge Planning: Goal: Ability to manage health-related needs will improve Outcome: Progressing   Problem: Clinical Measurements: Goal: Ability to maintain clinical measurements within normal limits will improve Outcome: Progressing Goal: Will remain free from infection Outcome: Progressing Goal: Diagnostic test results will improve Outcome: Progressing Goal: Respiratory complications will improve Outcome: Progressing Goal: Cardiovascular complication will be avoided Outcome: Progressing   Problem: Activity: Goal: Risk for activity intolerance will decrease Outcome: Progressing   Problem: Nutrition: Goal: Adequate nutrition will be maintained Outcome: Progressing   Problem: Elimination: Goal: Will not experience complications related to bowel motility Outcome: Progressing Goal: Will not experience complications related to urinary retention Outcome: Progressing   Problem: Pain Managment: Goal: General experience of comfort will improve Outcome: Progressing   Problem: Safety: Goal: Ability to remain free from injury will improve Outcome: Progressing   Problem: Skin Integrity: Goal: Risk for impaired skin integrity will decrease Outcome: Progressing   Problem: Education: Goal: Knowledge of disease or condition will improve Outcome: Progressing Goal: Knowledge of secondary prevention will improve Outcome: Progressing   Problem: Nutrition: Goal: Risk of aspiration will decrease Outcome: Progressing Goal: Dietary intake will improve Outcome: Progressing   Problem: Ischemic Stroke/TIA Tissue Perfusion: Goal: Complications of ischemic stroke/TIA will be minimized Outcome: Progressing

## 2018-06-27 NOTE — Progress Notes (Addendum)
PROGRESS NOTE  Brandi [name]  MHD:[id number] DOB: September 25, 1956 DOA: 06/23/2018 PCP: Brandi Lung, MD   Brief Narrative: Brandi [name] is a 62 y.o. [name] with a history of HTN, HLD, seizure disorder, left breast CA s/p lumpectomy 2009, GERD and depression who presented to the ED with abdominal pain, nausea, vomiting, and weakness with decreased per oral intake. She had been evaluated by PCP with elevated LFTs (AST 302, ALT 611, and AP 380) and subsequent CT demonstrating prominent caudate lobe and surface irregularity of the liver. Appearance on U/S in the ED confirmed this. She demonstrated lethargy and asterixis on exam and had elevated ammonia consistent with hepatic encephalopathy. In the ED she was found to have new-onset AFib with RVR which quickly converted to NSR with diltiazem which was subsequently converted to metoprolol due to EF decrease to 30-35% by cardiology. Further evaluation revealed microcytic anemia with ferritin of 9. GI was consulted, recommended lactulose and eventual endoscopic evaluation of iron-deficiency anemia. With ongoing encephalopathy, CT head was checked and revealed a right frontal operculum CVA so patient was transferred to [facility] for stroke evaluation and neurology consultation. Neurology feels this small infarct is embolic due to AFib and not the likely cause of encephalopathy. Anticoagulation was recommended by cardiology for AFib now with CVA, GI has given their blessing for this, and neurology also agrees that anticoagulation is indicated. Heparin is started due to inability of pt to take PO.   Assessment & Plan: Principal Problem:   Atrial fibrillation with RVR (HCC) Active Problems:   Major depression   Migraine headache   GERD (gastroesophageal reflux disease)   History of breast cancer in [name]   Hyperlipidemia associated with type 2 diabetes mellitus (HCC)   Leukocytosis   Type 2 diabetes mellitus without complication, without long-term current use  of insulin (HCC)   Hypertension associated with diabetes ([place])   Anemia   Elevated LFTs   Malaise   Schizophrenia (HCC)   History of seizures   Nausea and vomiting   Cerebral embolism with cerebral infarction  NASH cirrhosis with hepatic encephalopathy: EEG with mild generalized slowing.  - Appreciate GI recommendations.  - Will have to give lactulose PR given inability to take PO. Continue BID, feel there's been mild improvement today vs. yesterday. Hopefully with ongoing improvement, can transition to po soon.   New onset AFib with RVR: Converted to NSR. TSH 0.532. Note biatrial dilatation on echo. - Continue beta blocker at IV dose (BP tolerating) - Started anticoagulation for a CHA2DS2-VASc score of now 6 with CHF, CVA, DM. Heparin subtherapeutic last night, improved this AM.  - Monitor and replace K and Mg as needed.   New onset HFrEF: Echo this admission showed EF 30-35% with hypokinesis of the anteroseptal, anterior, inferoseptal, and apical myocardium and severely dilated left and right atria. - Beta blocker. Euvolemic currently so holding on diuretic. Hypotension limiting ACE/ARB.  - Will need ischemic evaluation, though currently not possible.   Iron-deficiency anemia: With chronic decrease in hgb and microcytosis. Ferritin low on panel here. Had incomplete colonoscopy previously. No overt GI bleeding noted.  - GI recommends work up once pt more stable.  - Hgb relatively stable, monitor with inception of anticoagulation  Right frontal operculum infarct: Most likely embolic in light of AFib. - CTA head, neck per neurology - Neurology opts for eliquis, will start heparin as she's NPO.   - Not on statin due to LDL of 28 and cirrhosis  Right-sided weakness: No new  infarct/hemorrhage or intracranial explanation. Will continue serial exams as pt's mental status improves.  - Monitor NIHSS, neuro checks q4h - PT/OT once able to participate  Seizure disorder:  - Per neurology,  AEDs converted to IV  HTN:  - Permissive HTN  T2DM: HbA1c 6.6% - SSI  DVT prophylaxis: Heparin gtt Code Status: Full Family Communication: None at bedside Disposition Plan: Uncertain  Consultants:   Cardiology  Neurology  Gastroenterology  Procedures:   Echocardiogram - Left ventricle: The cavity size was mildly dilated. Wallthickness was increased in a pattern of moderate LVH. Systolicfunction was moderately to severely reduced. The estimatedejection fraction was in the range of 30% to 35%. Severehypokinesis of the anteroseptal, anterior, inferoseptal, andapical myocardium. - Mitral valve: Mildly to moderately calcified annulus. Mobilitywas moderately restricted. There was mild to moderateregurgitation. - Left atrium: The atrium was severely dilated. - Right ventricle: The cavity size was moderately dilated. Wallthickness was normal. Systolic function was moderately reduced. - Right atrium: The atrium was moderately to severely dilated. - Pulmonary arteries: Systolic pressure was moderately to severelyincreased,estimated at approximately 45 mmHg. - Pericardium, extracardiac: A trivial pericardial effusion wasidentified.  Antimicrobials:  None   Subjective: More alert but not cooperative. Mimics questions without direct answers. Not oriented. Received lactulose yesterday.   Objective: Vitals:   06/26/18 2351 06/27/18 0332 06/27/18 0500 06/27/18 0750  BP:  105/62  131/87  Pulse:  95  (!) 105  Resp:    18  Temp: 97.7 F (36.5 C) 98.1 F (36.7 C)  97.8 F (36.6 C)  TempSrc: Oral Oral  Oral  SpO2:  98%  99%  Weight:   74.9 kg (165 lb 2 oz)   Height:        Intake/Output Summary (Last 24 hours) at 06/27/2018 0838 Last data filed at 06/27/2018 0800 Gross per 24 hour  Intake 902.24 ml  Output -  Net 902.24 ml   Filed Weights   06/25/18 0500 06/26/18 0700 06/27/18 0500  Weight: 77.8 kg (171 lb 8.3 oz) 75.3 kg (166 lb 0.1 oz) 74.9 kg (165 lb 2 oz)    Gen: 62 y.o. [name] in no distress Pulm: Nonlabored breathing room air. Clear. CV: Regular rate and rhythm. No murmur, rub, or gallop. No JVD, no dependent edema. GI: Abdomen soft, non-tender, non-distended, with normoactive bowel sounds.  Ext: Warm, no deformities Skin: No rashes, lesions or ulcers on visualized skin.  Neuro: Alert, restless, not oriented. Tongue movements noted. Doesn't hold right hand to gravity as much as left. Significant asterixis bilaterally. No other focal neurological deficits in incomplete exam. Psych: Judgement and insight impaired.   Data Reviewed: I have personally reviewed following labs and imaging studies  CBC: Recent Labs  Lab 06/20/18 1159 06/22/18 1350 06/23/18 0119 06/24/18 0312 06/25/18 0539 06/27/18 0614  WBC 8.7 15.1* 12.1* 10.1 9.9 8.8  NEUTROABS 6.5 11.5*  --   --  7.2 5.9  HGB 10.5* 9.9* 10.1* 9.4* 9.4* 9.1*  HCT 36.5 32.8* 35.1* 32.9* 33.4* 32.6*  MCV 66.5* 64* 66.7* 67.4* 67.7* 67.4*  PLT 556* 555* 600* 544* 558* 641*   Basic Metabolic Panel: Recent Labs  Lab 06/22/18 1350 06/23/18 0119 06/23/18 0432 06/24/18 0312 06/25/18 0335 06/27/18 0614  NA 138 137  --  139 142 145  K 4.4 3.2*  --  3.5 4.1 4.0  CL 99 98  --  107 110 114*  CO2 30* 27  --  24 24 22   GLUCOSE 139* 134*  --  130* 136*  117*  BUN 19 18  --  12 12 14   CREATININE 0.89 0.87  --  0.75 0.83 0.91  CALCIUM 9.7 9.0  --  8.3* 8.9 8.7*  MG  --   --  1.8  --   --   --    GFR: Estimated Creatinine Clearance: 58 mL/min (by C-G formula based on SCr of 0.91 mg/dL). Liver Function Tests: Recent Labs  Lab 06/22/18 1350 06/23/18 0119 06/24/18 0312 06/25/18 0335 06/27/18 0614  AST 35 40 47* 51* 56*  ALT 115* 101* 89* 80* 75*  ALKPHOS 307* 249* 223* 258* 374*  BILITOT 1.1 1.1 1.4* 1.0 1.5*  PROT 5.9* 6.4* 5.7* 5.6* 5.2*  ALBUMIN 3.8 3.5 3.2* 3.2* 3.0*   Recent Labs  Lab 06/20/18 1159 06/23/18 0119  LIPASE 35 37   Recent Labs  Lab 06/24/18 1225  06/26/18 0410  AMMONIA 22 38*   Coagulation Profile: Recent Labs  Lab 06/20/18 1159 06/23/18 0432 06/24/18 0312 06/25/18 0335 06/27/18 0614  INR 1.28 1.34 1.37 1.35 1.53   Cardiac Enzymes: No results for input(s): CKTOTAL, CKMB, CKMBINDEX, TROPONINI in the last 168 hours. BNP (last 3 results) No results for input(s): PROBNP in the last 8760 hours. HbA1C: No results for input(s): HGBA1C in the last 72 hours. CBG: Recent Labs  Lab 06/26/18 0628 06/26/18 1231 06/26/18 1613 06/26/18 2145 06/27/18 0642  GLUCAP 111* 135* 110* 149* 118*   Lipid Profile: Recent Labs    06/25/18 0335  CHOL 76  HDL 33*  LDLCALC 28  TRIG 74  CHOLHDL 2.3   Thyroid Function Tests: No results for input(s): TSH, T4TOTAL, FREET4, T3FREE, THYROIDAB in the last 72 hours. Anemia Panel: Recent Labs    06/25/18 0335  FERRITIN 9*  TIBC 375  IRON 14*   Urine analysis:    Component Value Date/Time   COLORURINE YELLOW 06/23/2018 [place] 06/23/2018 0434   APPEARANCEUR Cloudy 06/20/2013 1745   LABSPEC 1.014 06/23/2018 0434   LABSPEC 1.012 06/20/2013 1745   PHURINE 6.0 06/23/2018 0434   GLUCOSEU NEGATIVE 06/23/2018 0434   GLUCOSEU Negative 06/20/2013 1745   HGBUR NEGATIVE 06/23/2018 0434   BILIRUBINUR NEGATIVE 06/23/2018 0434   BILIRUBINUR n 05/17/2015 1425   BILIRUBINUR Negative 06/20/2013 1745   KETONESUR NEGATIVE 06/23/2018 0434   PROTEINUR NEGATIVE 06/23/2018 0434   UROBILINOGEN negative 05/17/2015 1425   UROBILINOGEN 0.2 06/29/2013 1530   NITRITE NEGATIVE 06/23/2018 0434   LEUKOCYTESUR NEGATIVE 06/23/2018 0434   LEUKOCYTESUR 3+ 06/20/2013 1745   Recent Results (from the past 240 hour(s))  MRSA PCR Screening     Status: None   Collection Time: 06/23/18 10:33 AM  Result Value Ref Range Status   MRSA by PCR NEGATIVE NEGATIVE Final    Comment:        The GeneXpert MRSA Assay (FDA approved for NASAL specimens only), is one component of a comprehensive MRSA  colonization surveillance program. It is not intended to diagnose MRSA infection nor to guide or monitor treatment for MRSA infections. Performed at [facility], [place] [address], [place], [place] [address]   Culture, blood (routine x 2)     Status: None (Preliminary result)   Collection Time: 06/24/18 12:56 PM  Result Value Ref Range Status   Specimen Description   Final    BLOOD LEFT ANTECUBITAL Performed at [facility] [address], [place], [place] [address]    Special Requests   Final    BOTTLES DRAWN AEROBIC  ONLY Blood Culture adequate volume Performed at [facility] [address], [place], [place] [address]    Culture   Final    NO GROWTH 2 DAYS Performed at [facility] [address], [place], [place] [address]    Report Status PENDING  Incomplete  Culture, blood (routine x 2)     Status: None (Preliminary result)   Collection Time: 06/24/18  1:03 PM  Result Value Ref Range Status   Specimen Description   Final    BLOOD LEFT HAND Performed at [facility] [address], [place], [place] [address]    Special Requests   Final    BOTTLES DRAWN AEROBIC ONLY Blood Culture results may not be optimal due to an inadequate volume of blood received in culture bottles Performed at [facility] [address] [place], [place] [address]    Culture   Final    NO GROWTH 2 DAYS Performed at [facility] [address] [place], [place] [address]    Report Status PENDING  Incomplete      Radiology Studies: Ct Angio Head W Or Wo Contrast  Result Date: 06/26/2018 CLINICAL DATA:  Right frontal infarction. EXAM: CT ANGIOGRAPHY HEAD AND NECK TECHNIQUE: Multidetector CT imaging of the head and neck was performed using the standard protocol during bolus administration of intravenous contrast. Multiplanar CT image reconstructions and MIPs were obtained to evaluate the  vascular anatomy. Carotid stenosis measurements (when applicable) are obtained utilizing NASCET criteria, using the distal internal carotid diameter as the denominator. CONTRAST:  68mL ISOVUE-370 IOPAMIDOL (ISOVUE-370) INJECTION 76% COMPARISON:  CT same day.  MRI 06/25/2018. FINDINGS: CTA NECK FINDINGS Aortic arch: Mild atherosclerosis of the arch. No aneurysm or dissection. Branching pattern of the brachiocephalic vessels from the arch is normal. Right carotid system: Common carotid artery widely patent to the bifurcation. Minimal atherosclerotic calcification at the ICA bulb but no stenosis or irregularity. Cervical ICA is widely patent. Left carotid system: Common carotid artery is widely patent to the bifurcation. Carotid bifurcation is widely patent. There is moderate atherosclerotic calcification of the ICA bulb but no stenosis or irregularity. Cervical ICA is widely patent. Vertebral arteries: Left vertebral artery is dominant. Both vertebral artery origins are widely patent. Both vertebral arteries are widely patent through the cervical region to the foramen magnum. Skeleton: Ordinary cervical spondylosis. Other neck: No mass or lymphadenopathy. Upper chest: Moderate bilateral effusions layering dependently with dependent pulmonary atelectasis. Review of the MIP images confirms the above findings CTA HEAD FINDINGS Anterior circulation: Both internal carotid arteries are patent through the skull base and siphon regions. There is atherosclerotic calcification of the carotid siphons but no focal stenosis. The anterior and middle cerebral vessels are patent without proximal stenosis, aneurysm or vascular malformation. In general, there is distal vessel narrowing and irregularity. Posterior circulation: Both vertebral arteries are patent to the basilar. No basilar stenosis. Posterior circulation branch vessels are patent and normal. Venous sinuses: Patent and normal. Anatomic variants: None significant. Delayed  phase: No abnormal enhancement. Review of the MIP images confirms the above findings IMPRESSION: Minimal calcified plaque at the carotid bifurcation regions but no stenosis or irregularity. No intracranial large or medium branch vessel occlusion or stenosis. I cannot specifically identify the missing branch vessel responsible for the right posterior frontal infarction, but this is presumably an occluded M3 or distal branch. The patient does in general have distal vessel atherosclerotic narrowing and irregularity. Electronically Signed  By: Nelson Chimes M.D.   On: 06/26/2018 19:40   Ct Head Wo Contrast  Result Date: 06/26/2018 CLINICAL DATA:  Evaluate for cerebral hemorrhage.  Recent stroke. EXAM: CT HEAD WITHOUT CONTRAST TECHNIQUE: Contiguous axial images were obtained from the base of the skull through the vertex without intravenous contrast. COMPARISON:  Brain MRI, 06/25/2018.  Head CT, 06/23/2018. FINDINGS: Brain: Subacute right lateral frontal lobe infarct is now well-defined on CT. No new areas of infarction since the prior brain MRI. No parenchymal or extra-axial masses. No midline shift. No hydrocephalus. No intracranial hemorrhage. Ventricular and sulcal enlargement is noted reflecting atrophy advanced for age. Vascular: No hyperdense vessel or unexpected calcification. Skull: Normal. Negative for fracture or focal lesion. Sinuses/Orbits: Globes and orbits are unremarkable. Sinuses and mastoid air cells are clear. Other: None. IMPRESSION: 1. The small right middle cerebral artery distribution, posterolateral frontal lobe infarct has evolved since the prior CT scan. It is now seen as a well-defined area of hypoattenuation. 2. No areas of new infarction. 3. No intracranial hemorrhage. Electronically Signed   By: Lajean Manes M.D.   On: 06/26/2018 19:33   Ct Angio Neck W Or Wo Contrast  Result Date: 06/26/2018 CLINICAL DATA:  Right frontal infarction. EXAM: CT ANGIOGRAPHY HEAD AND NECK TECHNIQUE:  Multidetector CT imaging of the head and neck was performed using the standard protocol during bolus administration of intravenous contrast. Multiplanar CT image reconstructions and MIPs were obtained to evaluate the vascular anatomy. Carotid stenosis measurements (when applicable) are obtained utilizing NASCET criteria, using the distal internal carotid diameter as the denominator. CONTRAST:  9mL ISOVUE-370 IOPAMIDOL (ISOVUE-370) INJECTION 76% COMPARISON:  CT same day.  MRI 06/25/2018. FINDINGS: CTA NECK FINDINGS Aortic arch: Mild atherosclerosis of the arch. No aneurysm or dissection. Branching pattern of the brachiocephalic vessels from the arch is normal. Right carotid system: Common carotid artery widely patent to the bifurcation. Minimal atherosclerotic calcification at the ICA bulb but no stenosis or irregularity. Cervical ICA is widely patent. Left carotid system: Common carotid artery is widely patent to the bifurcation. Carotid bifurcation is widely patent. There is moderate atherosclerotic calcification of the ICA bulb but no stenosis or irregularity. Cervical ICA is widely patent. Vertebral arteries: Left vertebral artery is dominant. Both vertebral artery origins are widely patent. Both vertebral arteries are widely patent through the cervical region to the foramen magnum. Skeleton: Ordinary cervical spondylosis. Other neck: No mass or lymphadenopathy. Upper chest: Moderate bilateral effusions layering dependently with dependent pulmonary atelectasis. Review of the MIP images confirms the above findings CTA HEAD FINDINGS Anterior circulation: Both internal carotid arteries are patent through the skull base and siphon regions. There is atherosclerotic calcification of the carotid siphons but no focal stenosis. The anterior and middle cerebral vessels are patent without proximal stenosis, aneurysm or vascular malformation. In general, there is distal vessel narrowing and irregularity. Posterior  circulation: Both vertebral arteries are patent to the basilar. No basilar stenosis. Posterior circulation branch vessels are patent and normal. Venous sinuses: Patent and normal. Anatomic variants: None significant. Delayed phase: No abnormal enhancement. Review of the MIP images confirms the above findings IMPRESSION: Minimal calcified plaque at the carotid bifurcation regions but no stenosis or irregularity. No intracranial large or medium branch vessel occlusion or stenosis. I cannot specifically identify the missing branch vessel responsible for the right posterior frontal infarction, but this is presumably an occluded M3 or distal branch. The patient does in general have distal vessel atherosclerotic narrowing and irregularity. Electronically Signed  By: Nelson Chimes M.D.   On: 06/26/2018 19:40   Mr Brain Wo Contrast  Result Date: 06/25/2018 CLINICAL DATA:  Encephalopathy.  Recent mental status change. EXAM: MRI HEAD WITHOUT CONTRAST TECHNIQUE: Multiplanar, multiecho pulse sequences of the brain and surrounding structures were obtained without intravenous contrast. COMPARISON:  CT head 06/23/2018 FINDINGS: Brain: Acute infarct right frontal lobe involving the frontal operculum. No other acute infarct. Negative for hemorrhage or mass Frontal and temporal lobe atrophy with volume loss and ventricular enlargement. Atrophy has progressed since the prior MRI of 07/14/2009 Vascular: Normal arterial flow void Skull and upper cervical spine: Negative Sinuses/Orbits: Paranasal sinuses clear. Bilateral cataract surgery. Other: None IMPRESSION: Acute infarct right frontal operculum Progression of atrophy since 2010. Atrophy most prominent in the frontal and temporal lobes bilaterally. Electronically Signed   By: Franchot Gallo M.D.   On: 06/25/2018 14:26    Scheduled Meds: . busPIRone  15 mg Oral Daily  . busPIRone  30 mg Oral QHS  . insulin aspart  0-9 Units Subcutaneous TID WC  . lactulose  300 mL Rectal  BID  . lurasidone  120 mg Oral Daily  . metoprolol tartrate  2.5 mg Intravenous Q8H  . venlafaxine XR  300 mg Oral Daily   Continuous Infusions: . famotidine (PEPCID) IV 20 mg (06/26/18 2216)  . heparin 1,000 Units/hr (06/27/18 0700)  . lacosamide (VIMPAT) IV 100 mg (06/26/18 2117)  . levETIRAcetam 1,000 mg (06/27/18 0035)     LOS: 4 days   Time spent: 35 minutes.  Patrecia Pour, MD [facility] [email or web address] Password [facility] 06/27/2018, 8:38 AM

## 2018-06-28 DIAGNOSIS — I5043 Acute on chronic combined systolic (congestive) and diastolic (congestive) heart failure: Secondary | ICD-10-CM

## 2018-06-28 LAB — CBC WITH DIFFERENTIAL/PLATELET
BASOS PCT: 1 %
Basophils Absolute: 0.1 10*3/uL (ref 0.0–0.1)
EOS ABS: 0.1 10*3/uL (ref 0.0–0.7)
Eosinophils Relative: 1 %
HCT: 35.1 % — ABNORMAL LOW (ref 36.0–46.0)
HEMOGLOBIN: 9.4 g/dL — AB (ref 12.0–15.0)
LYMPHS PCT: 15 %
Lymphs Abs: 1.5 10*3/uL (ref 0.7–4.0)
MCH: 18.7 pg — AB (ref 26.0–34.0)
MCHC: 26.8 g/dL — AB (ref 30.0–36.0)
MCV: 69.6 fL — ABNORMAL LOW (ref 78.0–100.0)
MONO ABS: 1.1 10*3/uL — AB (ref 0.1–1.0)
Monocytes Relative: 11 %
NEUTROS PCT: 72 %
Neutro Abs: 6.9 10*3/uL (ref 1.7–7.7)
Platelets: 347 10*3/uL (ref 150–400)
RBC: 5.04 MIL/uL (ref 3.87–5.11)
RDW: 21.2 % — ABNORMAL HIGH (ref 11.5–15.5)
WBC: 9.7 10*3/uL (ref 4.0–10.5)

## 2018-06-28 LAB — GLUCOSE, CAPILLARY
GLUCOSE-CAPILLARY: 137 mg/dL — AB (ref 70–99)
Glucose-Capillary: 103 mg/dL — ABNORMAL HIGH (ref 70–99)
Glucose-Capillary: 118 mg/dL — ABNORMAL HIGH (ref 70–99)
Glucose-Capillary: 100 mg/dL — ABNORMAL HIGH (ref 70–99)

## 2018-06-28 LAB — COMPREHENSIVE METABOLIC PANEL
ALK PHOS: 435 U/L — AB (ref 38–126)
ALT: 88 U/L — AB (ref 0–44)
AST: 78 U/L — ABNORMAL HIGH (ref 15–41)
Albumin: 3.1 g/dL — ABNORMAL LOW (ref 3.5–5.0)
Anion gap: 15 (ref 5–15)
BUN: 14 mg/dL (ref 8–23)
CALCIUM: 8.9 mg/dL (ref 8.9–10.3)
CO2: 16 mmol/L — ABNORMAL LOW (ref 22–32)
CREATININE: 1.02 mg/dL — AB (ref 0.44–1.00)
Chloride: 117 mmol/L — ABNORMAL HIGH (ref 98–111)
GFR, EST NON AFRICAN AMERICAN: 58 mL/min — AB (ref >60)
Glucose, Bld: 157 mg/dL — ABNORMAL HIGH (ref 70–99)
Potassium: 3.5 mmol/L (ref 3.5–5.1)
SODIUM: 148 mmol/L — AB (ref 135–145)
TOTAL PROTEIN: 5.7 g/dL — AB (ref 6.5–8.1)
Total Bilirubin: 1.8 mg/dL — ABNORMAL HIGH (ref 0.3–1.2)

## 2018-06-28 LAB — HEPARIN LEVEL (UNFRACTIONATED)
HEPARIN UNFRACTIONATED: 0.75 [IU]/mL — AB (ref 0.30–0.70)
HEPARIN UNFRACTIONATED: 0.71 [IU]/mL — AB (ref 0.30–0.70)

## 2018-06-28 MED ORDER — LACOSAMIDE 50 MG PO TABS
100.0000 mg | ORAL_TABLET | Freq: Two times a day (BID) | ORAL | Status: DC
  Administered 2018-06-28 – 2018-07-01 (×6): 100 mg via ORAL
  Filled 2018-06-28 (×6): qty 2

## 2018-06-28 MED ORDER — LOSARTAN POTASSIUM 50 MG PO TABS
25.0000 mg | ORAL_TABLET | Freq: Every day | ORAL | Status: DC
  Administered 2018-06-28: 25 mg via ORAL
  Filled 2018-06-28: qty 1

## 2018-06-28 MED ORDER — LEVETIRACETAM 500 MG PO TABS
1000.0000 mg | ORAL_TABLET | Freq: Two times a day (BID) | ORAL | Status: DC
  Administered 2018-06-28 (×2): 1000 mg via ORAL
  Filled 2018-06-28 (×2): qty 2

## 2018-06-28 MED ORDER — LORAZEPAM 0.5 MG PO TABS
0.5000 mg | ORAL_TABLET | Freq: Two times a day (BID) | ORAL | Status: DC | PRN
  Administered 2018-06-28 – 2018-06-29 (×2): 0.5 mg via ORAL
  Filled 2018-06-28 (×2): qty 1

## 2018-06-28 MED ORDER — APIXABAN 5 MG PO TABS
5.0000 mg | ORAL_TABLET | Freq: Two times a day (BID) | ORAL | Status: DC
  Administered 2018-06-28 (×2): 5 mg via ORAL
  Filled 2018-06-28 (×2): qty 1

## 2018-06-28 MED ORDER — FAMOTIDINE 20 MG PO TABS
20.0000 mg | ORAL_TABLET | Freq: Two times a day (BID) | ORAL | Status: DC
  Administered 2018-06-28 – 2018-06-30 (×4): 20 mg via ORAL
  Filled 2018-06-28 (×4): qty 1

## 2018-06-28 NOTE — Clinical Social Work Note (Signed)
Clinical Social Work Assessment  Patient Details  Name: Brandi [name] MRN: [id number] Date of Birth: [birth year]  Date of referral:  06/28/18               Reason for consult:  Facility Placement                Permission sought to share information with:  Family Supports, Customer service manager Permission granted to share information::     Name::     Brandi [name]  Agency::  [facility], [facility]  Relationship::  spouse  Contact Information:  [phone]  Housing/Transportation Living arrangements for the past 2 months:  Single Family Home Source of Information:  Spouse Patient Interpreter Needed:  None Criminal Activity/Legal Involvement Pertinent to Current Situation/Hospitalization:  No - Comment as needed Significant Relationships:  Spouse Lives with:  Spouse Do you feel safe going back to the place where you live?  No Need for family participation in patient care:  No (Coment)  Care giving concerns:  Pt is only alert to self. CSW spoke with pt's spouse via telephone--no additional caregivers noted at this time.   Social Worker assessment / plan:  CSW spoke with pt's spouse via telephone. Pt's spouse states pt was living independently with him prior to hospitalization. Pt's spouse understands SNF recommendation and would prefer pt to go to [facility] or [facility]. CSW to send referral and follow up with pt's spouse regarding bed availability.  Employment status:  Retired Forensic scientist:  Medicare PT Recommendations:  [facility] / Referral to community resources:  [place]  Patient/Family's Response to care:  Pt's spouse verbalized understanding of CSW role and expressed appreciation for support. Pt's spouse denies any concern regarding pt care at this time.  Patient/Family's Understanding of and Emotional Response to Diagnosis, Current Treatment, and Prognosis:  Pt's spouse understanding and realistic regarding pt's physical  limitations. Pt's spouse understands the need for pt to d/c to SNF-- Pt's son agreeable at this time. Pt denies any concern regarding pt's treatment plan at this time. CSW will continue to provide support and facilitate d/c needs.   Emotional Assessment Appearance:  Appears stated age Attitude/Demeanor/Rapport:  Unable to Assess Affect (typically observed):  Unable to Assess Orientation:  Oriented to Self Alcohol / Substance use:  Not Applicable Psych involvement (Current and /or in the community):  No (Comment)  Discharge Needs  Concerns to be addressed:  Basic Needs, Care Coordination Readmission within the last 30 days:  No Current discharge risk:  Dependent with Mobility Barriers to Discharge:  Continued Medical Work up   [employer], LCSW 06/28/2018, 2:36 PM

## 2018-06-28 NOTE — Progress Notes (Signed)
  Speech Language Pathology Treatment: Dysphagia  Patient Details Name: Brandi [name] MRN: [id number] DOB: 15-Oct-1956 Today's Date: 06/28/2018 Time: 8841-6606 SLP Time Calculation (min) (ACUTE ONLY): 15 min  Assessment / Plan / Recommendation Clinical Impression  Pt sleeping upon SlP entrance to room but would awaken to SLP stimulation briefly.  She is willing to accept po intake when straw/spoon placed to lips =- readily opens mouth.  No indication of aspiration with po observed.  RN reports pt appeared to have difficulty managing intake today - ? Appearing effortful with swallow.  Pt did appear to become uncomfortable after minimal intake characterized by frequent repositioning/slidiing down the bed - immediately after she started passing gas and having bowel movement.  SlP suspects this may contribute to apparent discomfort/? Effortful swallow.    Advised spouse to aspiration precautions and current diet recommendations.  He states pt feeds herself at home without deficits until recent n/v episodes.  Jaw clenching/ tooth grinding and lingual thrusing toward right is baseline but exacerbated currently per pt's spouse.  Pt also with episodes of some pausing and word finding deficits prior to admission per spouse.  Advised to help maximize liquid intake and have her help self feed for neurological input/maximum airway protection. Will follow up.     HPI HPI: 62 year old female admitted 06/23/18 with nausea, decreased po intake. PMH: HTN, HLD, depression, schizophrenia, Left breast CA, GERD, DM. MRI = acute infarct right frontal operculum      SLP Plan  Continue with current plan of care       Recommendations  Diet recommendations: Dysphagia 1 (puree);Thin liquid Liquids provided via: Straw Medication Administration: Other (Comment)(with puree) Supervision: Staff to assist with self feeding;Trained caregiver to feed patient Compensations: Slow rate;Small sips/bites Postural Changes and/or  Swallow Maneuvers: Seated upright 90 degrees;Upright 30-60 min after meal                Oral Care Recommendations: Oral care BID Follow up Recommendations: None SLP Visit Diagnosis: Dysphagia, oral phase (R13.11) Plan: Continue with current plan of care       GO                Macario Golds 06/28/2018, 1:39 PM Luanna Salk, [place] [facility] SLP [phone]

## 2018-06-28 NOTE — Progress Notes (Signed)
ANTICOAGULATION CONSULT NOTE - Initial Consult  Pharmacy Consult for apixaban  Indication: atrial fibrillation  Allergies  Allergen Reactions  . Penicillins Rash    Has patient had a PCN reaction causing immediate rash, facial/tongue/throat swelling, SOB or lightheadedness with hypotension: No Has patient had a PCN reaction causing severe rash involving mucus membranes or skin necrosis: No Has patient had a PCN reaction that required hospitalization No Has patient had a PCN reaction occurring within the last 10 years: No If all of the above answers are "NO", then may proceed with Cephalosporin use.     Patient Measurements: Height: 5' (152.4 cm) Weight: 161 lb 6 oz (73.2 kg) IBW/kg (Calculated) : 45.5  Vital Signs: Temp: 98.3 F (36.8 C) (07/14 0352) Temp Source: Oral (07/14 0352) BP: 122/94 (07/14 0549) Pulse Rate: 109 (07/14 0549)  Labs: Recent Labs    06/27/18 0614 06/28/18 0443 06/28/18 0620  HGB 9.1* 9.4*  --   HCT 32.6* 35.1*  --   PLT 441* 347  --   LABPROT 18.3*  --   --   INR 1.53  --   --   HEPARINUNFRC 0.58 0.75* 0.71*  CREATININE 0.91 1.02*  --     Estimated Creatinine Clearance: 51.1 mL/min (A) (by C-G formula based on SCr of 1.02 mg/dL (H)).   Medical History: Past Medical History:  Diagnosis Date  . Allergic rhinitis   . Anemia   . Arthritis HX RIGHT ANKLE FX  . Cancer [facility])    breast, lumpectomy  . Depression    MAJOR  . Dyslipidemia   . GERD (gastroesophageal reflux disease)   . History of breast cancer DX 2010--  S/P LUMPECTOMY AND RADIATION -- NO RECURRENCE   ONCOLOGIST- DR Truddie Coco  . Hypertension   . Left flank pain   . Left ureteral calculus   . Migraines   . Schizophrenic disorder ([place])   . 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 Type II or unspecified type diabetes mellitus without mention of complication, not stated as uncontrolled 03/16/2014    Medications:  Scheduled:  . busPIRone  15 mg Oral  Daily  . busPIRone  30 mg Oral QHS  . insulin aspart  0-9 Units Subcutaneous TID WC  . lactulose  20 g Oral TID  . metoprolol tartrate  2.5 mg Intravenous Q8H  . venlafaxine XR  300 mg Oral Daily   Infusions:  . famotidine (PEPCID) IV 20 mg (06/28/18 0104)  . heparin 900 Units/hr (06/27/18 1638)  . lacosamide (VIMPAT) IV 100 mg (06/27/18 2311)  . levETIRAcetam 1,000 mg (06/28/18 0034)    Assessment: 62 yo F initially presented to [facility] on 7/9 with nausea, new onset afib, abnormal LFTs, and anemia.  Pt noted with increased confusion 7/11 and MRI was completed which showed CVA.  Pt was transferred to [facility] for stroke eval.  Pt did not receive tPA due to unknown last seen normal time. Patient was initially started on heparin due to poor oral intake. Pharmacy has been consult to transition patient to apixaban for discharge.   Goal of Therapy:  Monitor platelets by anticoagulation protocol: Yes   Plan:  D/c heparin and heparin labs  Apixaban 5mg  BID  Monitor CBCs, serum creatinine, and bleeding.   Gwenlyn Found, Sherian Rein D PGY1 Pharmacy Resident  Phone [phone] 06/28/2018   8:06 AM

## 2018-06-28 NOTE — Discharge Instructions (Signed)

## 2018-06-28 NOTE — Progress Notes (Signed)
Progress Note  Patient Name: Brandi [name] Date of Encounter: 06/28/2018  Primary Cardiologist: Mertie Moores, MD   Subjective   62 yo with new onset acute systolic CHF - EF 65-03% Recent cva Moderate pulmonary HTN Still has altered mental status Have not pursued cath due to the recent CVA and altered mental status   Talked with husband today    Inpatient Medications    Scheduled Meds: . apixaban  5 mg Oral BID  . busPIRone  15 mg Oral Daily  . busPIRone  30 mg Oral QHS  . famotidine  20 mg Oral BID  . insulin aspart  0-9 Units Subcutaneous TID WC  . lacosamide  100 mg Oral BID  . lactulose  20 g Oral TID  . levETIRAcetam  1,000 mg Oral BID  . metoprolol tartrate  2.5 mg Intravenous Q8H  . venlafaxine XR  300 mg Oral Daily   Continuous Infusions:  PRN Meds: gi cocktail, HYDROcodone-acetaminophen, LORazepam, ondansetron **OR** ondansetron (ZOFRAN) IV, senna-docusate   Vital Signs    Vitals:   06/28/18 0003 06/28/18 0352 06/28/18 0500 06/28/18 0549  BP: (!) 129/59 102/87  (!) 122/94  Pulse: 92 (!) 108  (!) 109  Resp: 20 (!) 23  (!) 21  Temp: 98 F (36.7 C) 98.3 F (36.8 C)    TempSrc: Oral Oral    SpO2: 100% 99%  100%  Weight:   161 lb 6 oz (73.2 kg)   Height:        Intake/Output Summary (Last 24 hours) at 06/28/2018 1153 Last data filed at 06/28/2018 0800 Gross per 24 hour  Intake 586.83 ml  Output -  Net 586.83 ml   Filed Weights   06/26/18 0700 06/27/18 0500 06/28/18 0500  Weight: 166 lb 0.1 oz (75.3 kg) 165 lb 2 oz (74.9 kg) 161 lb 6 oz (73.2 kg)    Telemetry    Sinus rhythm, not much ectopy  ECG    Sinus rhythm, first degree heart block - Personally Reviewed  Physical Exam    Physical Exam: Blood pressure (!) 122/94, pulse (!) 109, temperature 98.3 F (36.8 C), temperature source Oral, resp. rate (!) 21, height 5' (1.524 m), weight 161 lb 6 oz (73.2 kg), SpO2 100 %.  GEN:   Middle age female.  somulent  HEENT: Normal NECK: No  JVD; No carotid bruits LYMPHATICS: No lymphadenopathy CARDIAC:  RR  RESPIRATORY:  Clear to auscultation without rales, wheezing or rhonchi  ABDOMEN: Soft, non-tender, non-distended MUSCULOSKELETAL:  No edema; No deformity  SKIN: Warm and dry NEUROLOGIC:  somulent ,  Would not respond to questions     Labs    Chemistry Recent Labs  Lab 06/25/18 0335 06/27/18 0614 06/28/18 0443  NA 142 145 148*  K 4.1 4.0 3.5  CL 110 114* 117*  CO2 24 22 16*  GLUCOSE 136* 117* 157*  BUN 12 14 14   CREATININE 0.83 0.91 1.02*  CALCIUM 8.9 8.7* 8.9  PROT 5.6* 5.2* 5.7*  ALBUMIN 3.2* 3.0* 3.1*  AST 51* 56* 78*  ALT 80* 75* 88*  ALKPHOS 258* 374* 435*  BILITOT 1.0 1.5* 1.8*  GFRNONAA >60 >60 58*  GFRAA >60 >60 >60  ANIONGAP 8 9 15      Hematology Recent Labs  Lab 06/25/18 0539 06/27/18 0614 06/28/18 0443  WBC 9.9 8.8 9.7  RBC 4.93 4.84 5.04  HGB 9.4* 9.1* 9.4*  HCT 33.4* 32.6* 35.1*  MCV 67.7* 67.4* 69.6*  MCH 19.1* 18.8* 18.7*  MCHC 28.1* 27.9* 26.8*  RDW 19.8* 20.6* 21.2*  PLT 558* 441* 347    Cardiac Enzymes  Recent Labs  Lab 06/23/18 0437  TROPIPOC 0.02     BNP Recent Labs  Lab 06/23/18 0119 06/23/18 0929  BNP 689.0* 607.3*     Radiology    Ct Angio Head W Or Wo Contrast  Result Date: 06/26/2018 CLINICAL DATA:  Right frontal infarction. EXAM: CT ANGIOGRAPHY HEAD AND NECK TECHNIQUE: Multidetector CT imaging of the head and neck was performed using the standard protocol during bolus administration of intravenous contrast. Multiplanar CT image reconstructions and MIPs were obtained to evaluate the vascular anatomy. Carotid stenosis measurements (when applicable) are obtained utilizing NASCET criteria, using the distal internal carotid diameter as the denominator. CONTRAST:  80mL ISOVUE-370 IOPAMIDOL (ISOVUE-370) INJECTION 76% COMPARISON:  CT same day.  MRI 06/25/2018. FINDINGS: CTA NECK FINDINGS Aortic arch: Mild atherosclerosis of the arch. No aneurysm or dissection.  Branching pattern of the brachiocephalic vessels from the arch is normal. Right carotid system: Common carotid artery widely patent to the bifurcation. Minimal atherosclerotic calcification at the ICA bulb but no stenosis or irregularity. Cervical ICA is widely patent. Left carotid system: Common carotid artery is widely patent to the bifurcation. Carotid bifurcation is widely patent. There is moderate atherosclerotic calcification of the ICA bulb but no stenosis or irregularity. Cervical ICA is widely patent. Vertebral arteries: Left vertebral artery is dominant. Both vertebral artery origins are widely patent. Both vertebral arteries are widely patent through the cervical region to the foramen magnum. Skeleton: Ordinary cervical spondylosis. Other neck: No mass or lymphadenopathy. Upper chest: Moderate bilateral effusions layering dependently with dependent pulmonary atelectasis. Review of the MIP images confirms the above findings CTA HEAD FINDINGS Anterior circulation: Both internal carotid arteries are patent through the skull base and siphon regions. There is atherosclerotic calcification of the carotid siphons but no focal stenosis. The anterior and middle cerebral vessels are patent without proximal stenosis, aneurysm or vascular malformation. In general, there is distal vessel narrowing and irregularity. Posterior circulation: Both vertebral arteries are patent to the basilar. No basilar stenosis. Posterior circulation branch vessels are patent and normal. Venous sinuses: Patent and normal. Anatomic variants: None significant. Delayed phase: No abnormal enhancement. Review of the MIP images confirms the above findings IMPRESSION: Minimal calcified plaque at the carotid bifurcation regions but no stenosis or irregularity. No intracranial large or medium branch vessel occlusion or stenosis. I cannot specifically identify the missing branch vessel responsible for the right posterior frontal infarction, but  this is presumably an occluded M3 or distal branch. The patient does in general have distal vessel atherosclerotic narrowing and irregularity. Electronically Signed   By: Nelson Chimes M.D.   On: 06/26/2018 19:40   Ct Head Wo Contrast  Result Date: 06/26/2018 CLINICAL DATA:  Evaluate for cerebral hemorrhage.  Recent stroke. EXAM: CT HEAD WITHOUT CONTRAST TECHNIQUE: Contiguous axial images were obtained from the base of the skull through the vertex without intravenous contrast. COMPARISON:  Brain MRI, 06/25/2018.  Head CT, 06/23/2018. FINDINGS: Brain: Subacute right lateral frontal lobe infarct is now well-defined on CT. No new areas of infarction since the prior brain MRI. No parenchymal or extra-axial masses. No midline shift. No hydrocephalus. No intracranial hemorrhage. Ventricular and sulcal enlargement is noted reflecting atrophy advanced for age. Vascular: No hyperdense vessel or unexpected calcification. Skull: Normal. Negative for fracture or focal lesion. Sinuses/Orbits: Globes and orbits are unremarkable. Sinuses and mastoid air cells are clear. Other: None. IMPRESSION: 1.  The small right middle cerebral artery distribution, posterolateral frontal lobe infarct has evolved since the prior CT scan. It is now seen as a well-defined area of hypoattenuation. 2. No areas of new infarction. 3. No intracranial hemorrhage. Electronically Signed   By: Lajean Manes M.D.   On: 06/26/2018 19:33   Ct Angio Neck W Or Wo Contrast  Result Date: 06/26/2018 CLINICAL DATA:  Right frontal infarction. EXAM: CT ANGIOGRAPHY HEAD AND NECK TECHNIQUE: Multidetector CT imaging of the head and neck was performed using the standard protocol during bolus administration of intravenous contrast. Multiplanar CT image reconstructions and MIPs were obtained to evaluate the vascular anatomy. Carotid stenosis measurements (when applicable) are obtained utilizing NASCET criteria, using the distal internal carotid diameter as the  denominator. CONTRAST:  85mL ISOVUE-370 IOPAMIDOL (ISOVUE-370) INJECTION 76% COMPARISON:  CT same day.  MRI 06/25/2018. FINDINGS: CTA NECK FINDINGS Aortic arch: Mild atherosclerosis of the arch. No aneurysm or dissection. Branching pattern of the brachiocephalic vessels from the arch is normal. Right carotid system: Common carotid artery widely patent to the bifurcation. Minimal atherosclerotic calcification at the ICA bulb but no stenosis or irregularity. Cervical ICA is widely patent. Left carotid system: Common carotid artery is widely patent to the bifurcation. Carotid bifurcation is widely patent. There is moderate atherosclerotic calcification of the ICA bulb but no stenosis or irregularity. Cervical ICA is widely patent. Vertebral arteries: Left vertebral artery is dominant. Both vertebral artery origins are widely patent. Both vertebral arteries are widely patent through the cervical region to the foramen magnum. Skeleton: Ordinary cervical spondylosis. Other neck: No mass or lymphadenopathy. Upper chest: Moderate bilateral effusions layering dependently with dependent pulmonary atelectasis. Review of the MIP images confirms the above findings CTA HEAD FINDINGS Anterior circulation: Both internal carotid arteries are patent through the skull base and siphon regions. There is atherosclerotic calcification of the carotid siphons but no focal stenosis. The anterior and middle cerebral vessels are patent without proximal stenosis, aneurysm or vascular malformation. In general, there is distal vessel narrowing and irregularity. Posterior circulation: Both vertebral arteries are patent to the basilar. No basilar stenosis. Posterior circulation branch vessels are patent and normal. Venous sinuses: Patent and normal. Anatomic variants: None significant. Delayed phase: No abnormal enhancement. Review of the MIP images confirms the above findings IMPRESSION: Minimal calcified plaque at the carotid bifurcation regions  but no stenosis or irregularity. No intracranial large or medium branch vessel occlusion or stenosis. I cannot specifically identify the missing branch vessel responsible for the right posterior frontal infarction, but this is presumably an occluded M3 or distal branch. The patient does in general have distal vessel atherosclerotic narrowing and irregularity. Electronically Signed   By: Nelson Chimes M.D.   On: 06/26/2018 19:40    Cardiac Studies   Echo 06/23/18: Study Conclusions - Left ventricle: The cavity size was mildly dilated. Wall   thickness was increased in a pattern of moderate LVH. Systolic   function was moderately to severely reduced. The estimated   ejection fraction was in the range of 30% to 35%. Severe   hypokinesis of the anteroseptal, anterior, inferoseptal, and   apical myocardium. - Mitral valve: Mildly to moderately calcified annulus. Mobility   was moderately restricted. There was mild to moderate   regurgitation. - Left atrium: The atrium was severely dilated. - Right ventricle: The cavity size was moderately dilated. Wall   thickness was normal. Systolic function was moderately reduced. - Right atrium: The atrium was moderately to severely dilated. - Pulmonary  arteries: Systolic pressure was moderately to severely   increased,estimated at approximately 45 mmHg. - Pericardium, extracardiac: A trivial pericardial effusion was   identified.  Patient Profile     62 y.o. female with a hx of breast cancer treated in 2010 w/ lumpectomy (no radiation), DLD, HTN, h/o seizures, GERD, depression and no prior cardiac history, who is being seen today for the evaluation of new atrial fibrillation, in the setting of acute GI illness.   Converted to NSR overnight, dilt off. Baseline cognitive impairment.  Assessment & Plan    1. New onset atrial fibrillation with RVR CHADS2VASC is now 5 ( HTN, female, CHF, CVA)  Needs DOAC .   Awaiting the OK from neuro since she had a  recent CVA    2. New onset systolic heart failure - EF 30-35% with hypokinesis of the anteroseptal, anterior, inferoseptal, and apical myocardium Cath has been delayed given new cva. She is not complaining of any angina  Continue medical therapy  Lasix Metoprolol BP is higher today .   Will start low dose Losartan      Mertie Moores, MD  06/28/2018 11:53 AM    [facility] [place],  [address], [place]  [address] Pager [phone] Phone: [phone]; Fax: [phone]

## 2018-06-28 NOTE — Progress Notes (Signed)
Patient in bed very restless.Moaning and groaning. MD made aware. See new order for lorazepam

## 2018-06-28 NOTE — Progress Notes (Signed)
PROGRESS NOTE  Brandi [name]  KVQ:[id number] DOB: 06/14/1956 DOA: 06/23/2018 PCP: Denita Lung, MD   Brief Narrative: Brandi [name] is a 62 y.o. female with a history of HTN, HLD, seizure disorder, left breast CA s/p lumpectomy 2009, GERD and depression who presented to the ED with abdominal pain, nausea, vomiting, and weakness with decreased per oral intake. She had been evaluated by PCP with elevated LFTs (AST 302, ALT 611, and AP 380) and subsequent CT demonstrating prominent caudate lobe and surface irregularity of the liver. Appearance on U/S in the ED confirmed this. She demonstrated lethargy and asterixis on exam and had elevated ammonia consistent with hepatic encephalopathy. In the ED she was found to have new-onset AFib with RVR which quickly converted to NSR with diltiazem which was subsequently converted to metoprolol due to EF decrease to 30-35% by cardiology. Further evaluation revealed microcytic anemia with ferritin of 9. GI was consulted, recommended lactulose and eventual endoscopic evaluation of iron-deficiency anemia. With ongoing encephalopathy, CT head was checked and revealed a right frontal operculum CVA so patient was transferred to [facility] for stroke evaluation and neurology consultation. Neurology feels this small infarct is embolic due to AFib and not the likely cause of encephalopathy. Anticoagulation was recommended by cardiology for AFib now with CVA, GI has given their blessing for this, and neurology also agrees that anticoagulation is indicated. Heparin was started due to inability of pt to take PO but now converting to eliquis as she's passed a swallow evaluation.   Assessment & Plan: Principal Problem:   Atrial fibrillation with RVR (HCC) Active Problems:   Major depression   Migraine headache   GERD (gastroesophageal reflux disease)   History of breast cancer in female   Hyperlipidemia associated with type 2 diabetes mellitus (HCC)   Leukocytosis   Type 2  diabetes mellitus without complication, without long-term current use of insulin (HCC)   Hypertension associated with diabetes ([place])   Anemia   Elevated LFTs   Malaise   Schizophrenia (HCC)   History of seizures   Nausea and vomiting   Cerebral embolism with cerebral infarction   Paroxysmal atrial fibrillation (HCC)  NASH cirrhosis with hepatic encephalopathy: EEG with mild generalized slowing.  - Appreciate GI recommendations.  - Continue lactulose at current po dosing. Having BMs. Lethargy this morning was unchanged, but waking up a bit today.  Acute delirium: It appears her lethargy is decreasing with lactulose but she's now displaying waxing/waning mental status changes.  - Due to concern for tardive dyskinesia, latuda was stopped. Will continue to hold this.  - Give low dose ativan, a home medication as needed.  - Since we are holding the antipsychotic and concerned about tardive dyskinesia and all of her mental status changes are not felt to be due to hepatic encephalopathy and the neurologist does not feel the stroke is playing a role in encephalopathy, I will ask for psychiatry evaluation. I appreciate their assistance.  New onset AFib with RVR: Converted to NSR. TSH 0.532. Note biatrial dilatation on echo. - Continue beta blocker at IV dose (BP tolerating) - Started anticoagulation for a CHA2DS2-VASc score of now 6 with CHF, CVA, DM. Both neurology and gastroenterology consultants have given their blessing for this. She's taking po, so will convert heparin to eliquis 7/14.  - Monitor and replace K and Mg as needed.   New onset HFrEF: Echo this admission showed EF 30-35% with hypokinesis of the anteroseptal, anterior, inferoseptal, and apical myocardium and severely dilated  left and right atria. - Beta blocker, now room in BP to start ARB. Euvolemic currently so holding on diuretic.  - Will need ischemic evaluation, though currently not possible.   Iron-deficiency anemia: With  chronic decrease in hgb and microcytosis. Ferritin low on panel here. Had incomplete colonoscopy previously. No overt GI bleeding noted.  - GI recommends work up once pt more stable.  - Hgb is stable will continue to monitor with inception of anticoagulation  Right frontal operculum infarct: Most likely embolic in light of AFib. - CTA head, neck shows presumably occluded M3 or distal right branch per neurology - Started eliquis for AFib as above.  - Not on statin due to LDL of 28 and cirrhosis - Continue therapy evaluations as able. Passed swallow.   Right-sided weakness: No new infarct/hemorrhage or intracranial explanation. Will continue serial exams as pt's mental status improves.  - Monitor NIHSS, neuro checks q4h - PT/OT once able to participate  Seizure disorder:  - Per neurology, AEDs converted to IV  HTN:  - Permissive HTN  T2DM: HbA1c 6.6% - SSI  DVT prophylaxis: Eliquis Code Status: Full Family Communication: None at bedside. Discussed with husband at length by phone 7/13. Disposition Plan: Uncertain  Consultants:   Cardiology  Neurology  Gastroenterology  Procedures:   Echocardiogram - Left ventricle: The cavity size was mildly dilated. Wallthickness was increased in a pattern of moderate LVH. Systolicfunction was moderately to severely reduced. The estimatedejection fraction was in the range of 30% to 35%. Severehypokinesis of the anteroseptal, anterior, inferoseptal, andapical myocardium. - Mitral valve: Mildly to moderately calcified annulus. Mobilitywas moderately restricted. There was mild to moderateregurgitation. - Left atrium: The atrium was severely dilated. - Right ventricle: The cavity size was moderately dilated. Wallthickness was normal. Systolic function was moderately reduced. - Right atrium: The atrium was moderately to severely dilated. - Pulmonary arteries: Systolic pressure was moderately to severelyincreased,estimated at  approximately 45 mmHg. - Pericardium, extracardiac: A trivial pericardial effusion wasidentified.  Antimicrobials:  None   Subjective: Alert, restless but in no distress. Nonverbal but following most commands. Having BMs. No bleeding.   Objective: Vitals:   06/28/18 0952 06/28/18 1152 06/28/18 1200 06/28/18 1352  BP: 122/69 116/82  128/90  Pulse: (!) 127 (!) 102  (!) 108  Resp: (!) 24 (!) 26  (!) 24  Temp:   98.6 F (37 C)   TempSrc:   Oral   SpO2: 100% 98%  100%  Weight:      Height:        Intake/Output Summary (Last 24 hours) at 06/28/2018 1426 Last data filed at 06/28/2018 1300 Gross per 24 hour  Intake 641.48 ml  Output -  Net 641.48 ml   Filed Weights   06/26/18 0700 06/27/18 0500 06/28/18 0500  Weight: 75.3 kg (166 lb 0.1 oz) 74.9 kg (165 lb 2 oz) 73.2 kg (161 lb 6 oz)   Gen: 62 y.o. female appearing restless but in no acute distress Pulm: Nonlabored breathing room air. Clear. CV: Regular rate and rhythm. No murmur, rub, or gallop. No JVD, no dependent edema. GI: Abdomen soft, non-tender, non-distended, with normoactive bowel sounds.  Ext: Warm, no deformities Skin: No rashes, lesions or ulcers on visualized skin. No jaundice. Neuro: Eyes closed but will open on command. Tracks but does not verbally respond to questions. Able to hold 4 extremities up against gravity and some resistance. +Asterixis. Significant tongue movements when alert, otherwise when watched there are no movements.   Psych: Judgement  and insight impaired.  Data Reviewed: I have personally reviewed following labs and imaging studies  CBC: Recent Labs  Lab 06/22/18 1350 06/23/18 0119 06/24/18 0312 06/25/18 0539 06/27/18 0614 06/28/18 0443  WBC 15.1* 12.1* 10.1 9.9 8.8 9.7  NEUTROABS 11.5*  --   --  7.2 5.9 6.9  HGB 9.9* 10.1* 9.4* 9.4* 9.1* 9.4*  HCT 32.8* 35.1* 32.9* 33.4* 32.6* 35.1*  MCV 64* 66.7* 67.4* 67.7* 67.4* 69.6*  PLT 555* 600* 544* 558* 441* 712   Basic Metabolic  Panel: Recent Labs  Lab 06/23/18 0119 06/23/18 0432 06/24/18 0312 06/25/18 0335 06/27/18 0614 06/28/18 0443  NA 137  --  139 142 145 148*  K 3.2*  --  3.5 4.1 4.0 3.5  CL 98  --  107 110 114* 117*  CO2 27  --  24 24 22  16*  GLUCOSE 134*  --  130* 136* 117* 157*  BUN 18  --  12 12 14 14   CREATININE 0.87  --  0.75 0.83 0.91 1.02*  CALCIUM 9.0  --  8.3* 8.9 8.7* 8.9  MG  --  1.8  --   --   --   --    GFR: Estimated Creatinine Clearance: 51.1 mL/min (A) (by C-G formula based on SCr of 1.02 mg/dL (H)). Liver Function Tests: Recent Labs  Lab 06/23/18 0119 06/24/18 0312 06/25/18 0335 06/27/18 0614 06/28/18 0443  AST 40 47* 51* 56* 78*  ALT 101* 89* 80* 75* 88*  ALKPHOS 249* 223* 258* 374* 435*  BILITOT 1.1 1.4* 1.0 1.5* 1.8*  PROT 6.4* 5.7* 5.6* 5.2* 5.7*  ALBUMIN 3.5 3.2* 3.2* 3.0* 3.1*   Recent Labs  Lab 06/23/18 0119  LIPASE 37   Recent Labs  Lab 06/24/18 1225 06/26/18 0410  AMMONIA 22 38*   Coagulation Profile: Recent Labs  Lab 06/23/18 0432 06/24/18 0312 06/25/18 0335 06/27/18 0614  INR 1.34 1.37 1.35 1.53   Cardiac Enzymes: No results for input(s): CKTOTAL, CKMB, CKMBINDEX, TROPONINI in the last 168 hours. BNP (last 3 results) No results for input(s): PROBNP in the last 8760 hours. HbA1C: No results for input(s): HGBA1C in the last 72 hours. CBG: Recent Labs  Lab 06/27/18 1121 06/27/18 1650 06/27/18 2237 06/28/18 0607 06/28/18 1127  GLUCAP 161* 125* 143* 137* 103*   Lipid Profile: No results for input(s): CHOL, HDL, LDLCALC, TRIG, CHOLHDL, LDLDIRECT in the last 72 hours. Thyroid Function Tests: No results for input(s): TSH, T4TOTAL, FREET4, T3FREE, THYROIDAB in the last 72 hours. Anemia Panel: No results for input(s): VITAMINB12, FOLATE, FERRITIN, TIBC, IRON, RETICCTPCT in the last 72 hours. Urine analysis:    Component Value Date/Time   COLORURINE YELLOW 06/23/2018 [place] 06/23/2018 0434   APPEARANCEUR Cloudy  06/20/2013 1745   LABSPEC 1.014 06/23/2018 0434   LABSPEC 1.012 06/20/2013 1745   PHURINE 6.0 06/23/2018 0434   GLUCOSEU NEGATIVE 06/23/2018 0434   GLUCOSEU Negative 06/20/2013 1745   HGBUR NEGATIVE 06/23/2018 0434   BILIRUBINUR NEGATIVE 06/23/2018 0434   BILIRUBINUR n 05/17/2015 1425   BILIRUBINUR Negative 06/20/2013 1745   KETONESUR NEGATIVE 06/23/2018 0434   PROTEINUR NEGATIVE 06/23/2018 0434   UROBILINOGEN negative 05/17/2015 1425   UROBILINOGEN 0.2 06/29/2013 1530   NITRITE NEGATIVE 06/23/2018 0434   LEUKOCYTESUR NEGATIVE 06/23/2018 0434   LEUKOCYTESUR 3+ 06/20/2013 1745   Recent Results (from the past 240 hour(s))  MRSA PCR Screening     Status: None   Collection Time: 06/23/18 10:33 AM  Result Value Ref  Range Status   MRSA by PCR NEGATIVE NEGATIVE Final    Comment:        The GeneXpert MRSA Assay (FDA approved for NASAL specimens only), is one component of a comprehensive MRSA colonization surveillance program. It is not intended to diagnose MRSA infection nor to guide or monitor treatment for MRSA infections. Performed at [facility], [place] [address], [place], [place] [address]   Culture, blood (routine x 2)     Status: None (Preliminary result)   Collection Time: 06/24/18 12:56 PM  Result Value Ref Range Status   Specimen Description   Final    BLOOD LEFT ANTECUBITAL Performed at [facility] [address], [place], [place] [address]    Special Requests   Final    BOTTLES DRAWN AEROBIC ONLY Blood Culture adequate volume Performed at [facility] [address], [place], [place] [address]    Culture   Final    NO GROWTH 3 DAYS Performed at [facility], [place] [address] [place], [place] [address]    Report Status PENDING  Incomplete  Culture, blood (routine x 2)     Status: None (Preliminary result)   Collection Time: 06/24/18  1:03 PM  Result Value Ref Range Status   Specimen Description    Final    BLOOD LEFT HAND Performed at [facility] [address] [place], [place] [address]    Special Requests   Final    BOTTLES DRAWN AEROBIC ONLY Blood Culture results may not be optimal due to an inadequate volume of blood received in culture bottles Performed at [facility] [address], [place], [place] [address]    Culture   Final    NO GROWTH 3 DAYS Performed at [facility], [place] [address] [place], [place] [address]    Report Status PENDING  Incomplete      Radiology Studies: Ct Angio Head W Or Wo Contrast  Result Date: 06/26/2018 CLINICAL DATA:  Right frontal infarction. EXAM: CT ANGIOGRAPHY HEAD AND NECK TECHNIQUE: Multidetector CT imaging of the head and neck was performed using the standard protocol during bolus administration of intravenous contrast. Multiplanar CT image reconstructions and MIPs were obtained to evaluate the vascular anatomy. Carotid stenosis measurements (when applicable) are obtained utilizing NASCET criteria, using the distal internal carotid diameter as the denominator. CONTRAST:  74mL ISOVUE-370 IOPAMIDOL (ISOVUE-370) INJECTION 76% COMPARISON:  CT same day.  MRI 06/25/2018. FINDINGS: CTA NECK FINDINGS Aortic arch: Mild atherosclerosis of the arch. No aneurysm or dissection. Branching pattern of the brachiocephalic vessels from the arch is normal. Right carotid system: Common carotid artery widely patent to the bifurcation. Minimal atherosclerotic calcification at the ICA bulb but no stenosis or irregularity. Cervical ICA is widely patent. Left carotid system: Common carotid artery is widely patent to the bifurcation. Carotid bifurcation is widely patent. There is moderate atherosclerotic calcification of the ICA bulb but no stenosis or irregularity. Cervical ICA is widely patent. Vertebral arteries: Left vertebral artery is dominant. Both vertebral artery origins are widely patent. Both vertebral arteries are  widely patent through the cervical region to the foramen magnum. Skeleton: Ordinary cervical spondylosis. Other neck: No mass or lymphadenopathy. Upper chest: Moderate bilateral effusions layering dependently with dependent pulmonary atelectasis. Review of the MIP images confirms the above findings CTA HEAD FINDINGS Anterior circulation: Both internal carotid arteries are patent through the skull base and siphon regions. There is atherosclerotic calcification of the  carotid siphons but no focal stenosis. The anterior and middle cerebral vessels are patent without proximal stenosis, aneurysm or vascular malformation. In general, there is distal vessel narrowing and irregularity. Posterior circulation: Both vertebral arteries are patent to the basilar. No basilar stenosis. Posterior circulation branch vessels are patent and normal. Venous sinuses: Patent and normal. Anatomic variants: None significant. Delayed phase: No abnormal enhancement. Review of the MIP images confirms the above findings IMPRESSION: Minimal calcified plaque at the carotid bifurcation regions but no stenosis or irregularity. No intracranial large or medium branch vessel occlusion or stenosis. I cannot specifically identify the missing branch vessel responsible for the right posterior frontal infarction, but this is presumably an occluded M3 or distal branch. The patient does in general have distal vessel atherosclerotic narrowing and irregularity. Electronically Signed   By: Nelson Chimes M.D.   On: 06/26/2018 19:40   Ct Head Wo Contrast  Result Date: 06/26/2018 CLINICAL DATA:  Evaluate for cerebral hemorrhage.  Recent stroke. EXAM: CT HEAD WITHOUT CONTRAST TECHNIQUE: Contiguous axial images were obtained from the base of the skull through the vertex without intravenous contrast. COMPARISON:  Brain MRI, 06/25/2018.  Head CT, 06/23/2018. FINDINGS: Brain: Subacute right lateral frontal lobe infarct is now well-defined on CT. No new areas of  infarction since the prior brain MRI. No parenchymal or extra-axial masses. No midline shift. No hydrocephalus. No intracranial hemorrhage. Ventricular and sulcal enlargement is noted reflecting atrophy advanced for age. Vascular: No hyperdense vessel or unexpected calcification. Skull: Normal. Negative for fracture or focal lesion. Sinuses/Orbits: Globes and orbits are unremarkable. Sinuses and mastoid air cells are clear. Other: None. IMPRESSION: 1. The small right middle cerebral artery distribution, posterolateral frontal lobe infarct has evolved since the prior CT scan. It is now seen as a well-defined area of hypoattenuation. 2. No areas of new infarction. 3. No intracranial hemorrhage. Electronically Signed   By: Lajean Manes M.D.   On: 06/26/2018 19:33   Ct Angio Neck W Or Wo Contrast  Result Date: 06/26/2018 CLINICAL DATA:  Right frontal infarction. EXAM: CT ANGIOGRAPHY HEAD AND NECK TECHNIQUE: Multidetector CT imaging of the head and neck was performed using the standard protocol during bolus administration of intravenous contrast. Multiplanar CT image reconstructions and MIPs were obtained to evaluate the vascular anatomy. Carotid stenosis measurements (when applicable) are obtained utilizing NASCET criteria, using the distal internal carotid diameter as the denominator. CONTRAST:  24mL ISOVUE-370 IOPAMIDOL (ISOVUE-370) INJECTION 76% COMPARISON:  CT same day.  MRI 06/25/2018. FINDINGS: CTA NECK FINDINGS Aortic arch: Mild atherosclerosis of the arch. No aneurysm or dissection. Branching pattern of the brachiocephalic vessels from the arch is normal. Right carotid system: Common carotid artery widely patent to the bifurcation. Minimal atherosclerotic calcification at the ICA bulb but no stenosis or irregularity. Cervical ICA is widely patent. Left carotid system: Common carotid artery is widely patent to the bifurcation. Carotid bifurcation is widely patent. There is moderate atherosclerotic  calcification of the ICA bulb but no stenosis or irregularity. Cervical ICA is widely patent. Vertebral arteries: Left vertebral artery is dominant. Both vertebral artery origins are widely patent. Both vertebral arteries are widely patent through the cervical region to the foramen magnum. Skeleton: Ordinary cervical spondylosis. Other neck: No mass or lymphadenopathy. Upper chest: Moderate bilateral effusions layering dependently with dependent pulmonary atelectasis. Review of the MIP images confirms the above findings CTA HEAD FINDINGS Anterior circulation: Both internal carotid arteries are patent through the skull base and siphon regions. There is atherosclerotic calcification of the  carotid siphons but no focal stenosis. The anterior and middle cerebral vessels are patent without proximal stenosis, aneurysm or vascular malformation. In general, there is distal vessel narrowing and irregularity. Posterior circulation: Both vertebral arteries are patent to the basilar. No basilar stenosis. Posterior circulation branch vessels are patent and normal. Venous sinuses: Patent and normal. Anatomic variants: None significant. Delayed phase: No abnormal enhancement. Review of the MIP images confirms the above findings IMPRESSION: Minimal calcified plaque at the carotid bifurcation regions but no stenosis or irregularity. No intracranial large or medium branch vessel occlusion or stenosis. I cannot specifically identify the missing branch vessel responsible for the right posterior frontal infarction, but this is presumably an occluded M3 or distal branch. The patient does in general have distal vessel atherosclerotic narrowing and irregularity. Electronically Signed   By: Nelson Chimes M.D.   On: 06/26/2018 19:40    Scheduled Meds: . apixaban  5 mg Oral BID  . busPIRone  15 mg Oral Daily  . busPIRone  30 mg Oral QHS  . famotidine  20 mg Oral BID  . insulin aspart  0-9 Units Subcutaneous TID WC  . lacosamide  100  mg Oral BID  . lactulose  20 g Oral TID  . levETIRAcetam  1,000 mg Oral BID  . losartan  25 mg Oral Daily  . metoprolol tartrate  2.5 mg Intravenous Q8H  . venlafaxine XR  300 mg Oral Daily   Continuous Infusions:    LOS: 5 days   Time spent: 35 minutes.  Patrecia Pour, MD [facility] [email or web address] Password [email or web address] 06/28/2018, 2:26 PM

## 2018-06-29 ENCOUNTER — Inpatient Hospital Stay (HOSPITAL_COMMUNITY): Payer: Medicare Other

## 2018-06-29 DIAGNOSIS — Z818 Family history of other mental and behavioral disorders: Secondary | ICD-10-CM

## 2018-06-29 DIAGNOSIS — J9601 Acute respiratory failure with hypoxia: Secondary | ICD-10-CM

## 2018-06-29 DIAGNOSIS — J9600 Acute respiratory failure, unspecified whether with hypoxia or hypercapnia: Secondary | ICD-10-CM

## 2018-06-29 DIAGNOSIS — F411 Generalized anxiety disorder: Secondary | ICD-10-CM

## 2018-06-29 DIAGNOSIS — R630 Anorexia: Secondary | ICD-10-CM

## 2018-06-29 DIAGNOSIS — G9340 Encephalopathy, unspecified: Secondary | ICD-10-CM

## 2018-06-29 DIAGNOSIS — R0602 Shortness of breath: Secondary | ICD-10-CM

## 2018-06-29 DIAGNOSIS — K922 Gastrointestinal hemorrhage, unspecified: Secondary | ICD-10-CM

## 2018-06-29 DIAGNOSIS — K760 Fatty (change of) liver, not elsewhere classified: Secondary | ICD-10-CM

## 2018-06-29 DIAGNOSIS — R110 Nausea: Secondary | ICD-10-CM

## 2018-06-29 DIAGNOSIS — R579 Shock, unspecified: Secondary | ICD-10-CM

## 2018-06-29 DIAGNOSIS — R002 Palpitations: Secondary | ICD-10-CM

## 2018-06-29 LAB — COMPREHENSIVE METABOLIC PANEL
ALBUMIN: 3.3 g/dL — AB (ref 3.5–5.0)
ALK PHOS: 404 U/L — AB (ref 38–126)
ALT: 1825 U/L — ABNORMAL HIGH (ref 0–44)
ALT: 1976 U/L — AB (ref 0–44)
AST: 3365 U/L — AB (ref 15–41)
AST: 3942 U/L — AB (ref 15–41)
Albumin: 3.2 g/dL — ABNORMAL LOW (ref 3.5–5.0)
Alkaline Phosphatase: 409 U/L — ABNORMAL HIGH (ref 38–126)
Anion gap: 20 — ABNORMAL HIGH (ref 5–15)
Anion gap: 21 — ABNORMAL HIGH (ref 5–15)
BILIRUBIN TOTAL: 2.9 mg/dL — AB (ref 0.3–1.2)
BILIRUBIN TOTAL: 2.9 mg/dL — AB (ref 0.3–1.2)
BUN: 28 mg/dL — AB (ref 8–23)
BUN: 32 mg/dL — ABNORMAL HIGH (ref 8–23)
CALCIUM: 9.5 mg/dL (ref 8.9–10.3)
CO2: 15 mmol/L — ABNORMAL LOW (ref 22–32)
CO2: 12 mmol/L — AB (ref 22–32)
CREATININE: 2.14 mg/dL — AB (ref 0.44–1.00)
Calcium: 9.7 mg/dL (ref 8.9–10.3)
Chloride: 122 mmol/L — ABNORMAL HIGH (ref 98–111)
Chloride: 123 mmol/L — ABNORMAL HIGH (ref 98–111)
Creatinine, Ser: 1.9 mg/dL — ABNORMAL HIGH (ref 0.44–1.00)
GFR calc Af Amer: 27 mL/min — ABNORMAL LOW (ref >60)
GFR calc non Af Amer: 24 mL/min — ABNORMAL LOW (ref >60)
GFR, EST AFRICAN AMERICAN: 32 mL/min — AB (ref >60)
GFR, EST NON AFRICAN AMERICAN: 27 mL/min — AB (ref >60)
Glucose, Bld: 66 mg/dL — ABNORMAL LOW (ref 70–99)
Glucose, Bld: 95 mg/dL (ref 70–99)
POTASSIUM: 5.3 mmol/L — AB (ref 3.5–5.1)
Potassium: 5.3 mmol/L — ABNORMAL HIGH (ref 3.5–5.1)
SODIUM: 156 mmol/L — AB (ref 135–145)
Sodium: 157 mmol/L — ABNORMAL HIGH (ref 135–145)
TOTAL PROTEIN: 5.4 g/dL — AB (ref 6.5–8.1)
Total Protein: 5.4 g/dL — ABNORMAL LOW (ref 6.5–8.1)

## 2018-06-29 LAB — POCT I-STAT 3, ART BLOOD GAS (G3+)
ACID-BASE DEFICIT: 10 mmol/L — AB (ref 0.0–2.0)
Bicarbonate: 15.9 mmol/L — ABNORMAL LOW (ref 20.0–28.0)
O2 SAT: 100 %
PH ART: 7.263 — AB (ref 7.350–7.450)
PO2 ART: 333 mmHg — AB (ref 83.0–108.0)
TCO2: 17 mmol/L — ABNORMAL LOW (ref 22–32)
pCO2 arterial: 35.2 mmHg (ref 32.0–48.0)

## 2018-06-29 LAB — LACTIC ACID, PLASMA
LACTIC ACID, VENOUS: 10.8 mmol/L — AB (ref 0.5–1.9)
Lactic Acid, Venous: 8.3 mmol/L (ref 0.5–1.9)
Lactic Acid, Venous: 4.6 mmol/L (ref 0.5–1.9)

## 2018-06-29 LAB — PROTIME-INR
INR: 8.74
INR: 7.88
Prothrombin Time: 71.2 seconds — ABNORMAL HIGH (ref 11.4–15.2)
Prothrombin Time: 65.7 seconds — ABNORMAL HIGH (ref 11.4–15.2)

## 2018-06-29 LAB — CBC WITH DIFFERENTIAL/PLATELET
BASOS PCT: 0 %
Basophils Absolute: 0 10*3/uL (ref 0.0–0.1)
EOS PCT: 0 %
Eosinophils Absolute: 0 10*3/uL (ref 0.0–0.7)
HEMATOCRIT: 33.1 % — AB (ref 36.0–46.0)
Hemoglobin: 8.9 g/dL — ABNORMAL LOW (ref 12.0–15.0)
LYMPHS ABS: 1 10*3/uL (ref 0.7–4.0)
Lymphocytes Relative: 3 %
MCH: 19.1 pg — AB (ref 26.0–34.0)
MCHC: 26.9 g/dL — ABNORMAL LOW (ref 30.0–36.0)
MCV: 70.9 fL — AB (ref 78.0–100.0)
MONO ABS: 2.2 10*3/uL — AB (ref 0.1–1.0)
Monocytes Relative: 7 %
NEUTROS ABS: 28.7 10*3/uL — AB (ref 1.7–7.7)
Neutrophils Relative %: 90 %
Platelets: 415 10*3/uL — ABNORMAL HIGH (ref 150–400)
RBC: 4.67 MIL/uL (ref 3.87–5.11)
RDW: 21.5 % — AB (ref 11.5–15.5)
WBC Morphology: INCREASED
WBC: 31.9 10*3/uL — ABNORMAL HIGH (ref 4.0–10.5)

## 2018-06-29 LAB — MAGNESIUM: Magnesium: 2.1 mg/dL (ref 1.7–2.4)

## 2018-06-29 LAB — BASIC METABOLIC PANEL
ANION GAP: 15 (ref 5–15)
BUN: 36 mg/dL — AB (ref 8–23)
CO2: 17 mmol/L — AB (ref 22–32)
Calcium: 9.3 mg/dL (ref 8.9–10.3)
Chloride: 121 mmol/L — ABNORMAL HIGH (ref 98–111)
Creatinine, Ser: 2.31 mg/dL — ABNORMAL HIGH (ref 0.44–1.00)
GFR calc Af Amer: 25 mL/min — ABNORMAL LOW (ref >60)
GFR, EST NON AFRICAN AMERICAN: 21 mL/min — AB (ref >60)
GLUCOSE: 177 mg/dL — AB (ref 70–99)
POTASSIUM: 4.5 mmol/L (ref 3.5–5.1)
Sodium: 153 mmol/L — ABNORMAL HIGH (ref 135–145)

## 2018-06-29 LAB — HEMOGLOBIN AND HEMATOCRIT, BLOOD
HCT: 30.7 % — ABNORMAL LOW (ref 36.0–46.0)
HEMATOCRIT: 34.7 % — AB (ref 36.0–46.0)
HEMOGLOBIN: 8.2 g/dL — AB (ref 12.0–15.0)
Hemoglobin: 9.2 g/dL — ABNORMAL LOW (ref 12.0–15.0)

## 2018-06-29 LAB — CULTURE, BLOOD (ROUTINE X 2)
Culture: NO GROWTH
Culture: NO GROWTH
SPECIAL REQUESTS: ADEQUATE

## 2018-06-29 LAB — GLUCOSE, CAPILLARY
GLUCOSE-CAPILLARY: 61 mg/dL — AB (ref 70–99)
GLUCOSE-CAPILLARY: 93 mg/dL (ref 70–99)
GLUCOSE-CAPILLARY: 161 mg/dL — AB (ref 70–99)
GLUCOSE-CAPILLARY: 144 mg/dL — AB (ref 70–99)
Glucose-Capillary: 72 mg/dL (ref 70–99)
Glucose-Capillary: 109 mg/dL — ABNORMAL HIGH (ref 70–99)
Glucose-Capillary: 74 mg/dL (ref 70–99)
Glucose-Capillary: 202 mg/dL — ABNORMAL HIGH (ref 70–99)

## 2018-06-29 LAB — TROPONIN I
Troponin I: 0.2 ng/mL (ref <0.03)
Troponin I: 0.28 ng/mL (ref <0.03)

## 2018-06-29 LAB — BRAIN NATRIURETIC PEPTIDE: B Natriuretic Peptide: 2383.4 pg/mL — ABNORMAL HIGH (ref 0.0–100.0)

## 2018-06-29 LAB — AMMONIA: Ammonia: 96 umol/L — ABNORMAL HIGH (ref 9–35)

## 2018-06-29 MED ORDER — STERILE WATER FOR INJECTION IV SOLN
INTRAVENOUS | Status: DC
  Administered 2018-06-29 – 2018-06-30 (×2): via INTRAVENOUS
  Filled 2018-06-29 (×3): qty 850

## 2018-06-29 MED ORDER — MIDAZOLAM HCL 2 MG/2ML IJ SOLN
2.0000 mg | INTRAMUSCULAR | Status: DC | PRN
  Administered 2018-06-29 – 2018-07-03 (×19): 2 mg via INTRAVENOUS
  Filled 2018-06-29 (×20): qty 2

## 2018-06-29 MED ORDER — DEXTROSE 5 % IV SOLN
INTRAVENOUS | Status: DC
  Administered 2018-06-29 – 2018-06-30 (×3): via INTRAVENOUS

## 2018-06-29 MED ORDER — PIPERACILLIN-TAZOBACTAM IN DEX 2-0.25 GM/50ML IV SOLN
2.2500 g | Freq: Three times a day (TID) | INTRAVENOUS | Status: DC
  Administered 2018-06-29 – 2018-06-30 (×4): 2.25 g via INTRAVENOUS
  Filled 2018-06-29 (×6): qty 50

## 2018-06-29 MED ORDER — MIDAZOLAM HCL 2 MG/2ML IJ SOLN
2.0000 mg | INTRAMUSCULAR | Status: AC | PRN
  Administered 2018-06-30 (×3): 2 mg via INTRAVENOUS
  Filled 2018-06-29 (×3): qty 2

## 2018-06-29 MED ORDER — FENTANYL CITRATE (PF) 100 MCG/2ML IJ SOLN: 100.0000 ug | Freq: Once | INTRAMUSCULAR | Status: DC

## 2018-06-29 MED ORDER — DEXTROSE 50 % IV SOLN
INTRAVENOUS | Status: AC
  Administered 2018-06-29: 20 mL
  Filled 2018-06-29: qty 50

## 2018-06-29 MED ORDER — ETOMIDATE 2 MG/ML IV SOLN
20.0000 mg | Freq: Once | INTRAVENOUS | Status: AC
  Administered 2018-06-29: 20 mg via INTRAVENOUS

## 2018-06-29 MED ORDER — SODIUM CHLORIDE 0.9 % IV SOLN
80.0000 mg | Freq: Once | INTRAVENOUS | Status: AC
  Administered 2018-06-29: 80 mg via INTRAVENOUS
  Filled 2018-06-29: qty 80

## 2018-06-29 MED ORDER — DEXTROSE 50 % IV SOLN
1.0000 | Freq: Once | INTRAVENOUS | Status: AC
  Administered 2018-06-29: 50 mL via INTRAVENOUS
  Filled 2018-06-29: qty 50

## 2018-06-29 MED ORDER — SODIUM CHLORIDE 0.9 % IV SOLN
8.0000 mg/h | INTRAVENOUS | Status: AC
  Administered 2018-06-29 – 2018-07-02 (×7): 8 mg/h via INTRAVENOUS
  Filled 2018-06-29 (×13): qty 80

## 2018-06-29 MED ORDER — SODIUM CHLORIDE 0.9% IV SOLUTION
Freq: Once | INTRAVENOUS | Status: AC
  Administered 2018-06-30: 03:00:00 via INTRAVENOUS

## 2018-06-29 MED ORDER — VANCOMYCIN HCL 10 G IV SOLR
1250.0000 mg | Freq: Once | INTRAVENOUS | Status: AC
  Administered 2018-06-29: 1250 mg via INTRAVENOUS
  Filled 2018-06-29: qty 1250

## 2018-06-29 MED ORDER — ROCURONIUM BROMIDE 50 MG/5ML IV SOLN
50.0000 mg | Freq: Once | INTRAVENOUS | Status: AC
  Administered 2018-06-29: 50 mg via INTRAVENOUS

## 2018-06-29 MED ORDER — FENTANYL CITRATE (PF) 100 MCG/2ML IJ SOLN
100.0000 ug | INTRAMUSCULAR | Status: DC | PRN
  Administered 2018-07-02 (×2): 100 ug via INTRAVENOUS
  Filled 2018-06-29 (×2): qty 2

## 2018-06-29 MED ORDER — VANCOMYCIN VARIABLE DOSE PER UNSTABLE RENAL FUNCTION (PHARMACIST DOSING): Status: DC

## 2018-06-29 MED ORDER — WHITE PETROLATUM EX OINT
TOPICAL_OINTMENT | CUTANEOUS | Status: AC
  Administered 2018-06-29: 01:00:00
  Filled 2018-06-29: qty 28.35

## 2018-06-29 MED ORDER — FREE WATER
200.0000 mL | Freq: Three times a day (TID) | Status: DC
  Administered 2018-06-29 – 2018-07-05 (×8): 200 mL

## 2018-06-29 MED ORDER — ORAL CARE MOUTH RINSE
15.0000 mL | OROMUCOSAL | Status: DC
  Administered 2018-06-29 – 2018-07-23 (×227): 15 mL via OROMUCOSAL

## 2018-06-29 MED ORDER — MIDAZOLAM HCL 2 MG/2ML IJ SOLN
INTRAMUSCULAR | Status: AC
  Administered 2018-06-29: 2 mg
  Filled 2018-06-29: qty 2

## 2018-06-29 MED ORDER — NOREPINEPHRINE 4 MG/250ML-% IV SOLN
0.0000 ug/min | INTRAVENOUS | Status: DC
  Administered 2018-06-29: 20 ug/min via INTRAVENOUS
  Administered 2018-07-04: 11 ug/min via INTRAVENOUS
  Filled 2018-06-29 (×3): qty 250

## 2018-06-29 MED ORDER — HALOPERIDOL LACTATE 5 MG/ML IJ SOLN
2.0000 mg | Freq: Once | INTRAMUSCULAR | Status: AC
  Administered 2018-06-29: 2 mg via INTRAVENOUS
  Filled 2018-06-29: qty 1

## 2018-06-29 MED ORDER — FENTANYL CITRATE (PF) 100 MCG/2ML IJ SOLN
INTRAMUSCULAR | Status: AC
  Filled 2018-06-29: qty 2

## 2018-06-29 MED ORDER — FENTANYL CITRATE (PF) 100 MCG/2ML IJ SOLN
100.0000 ug | INTRAMUSCULAR | Status: DC | PRN
  Administered 2018-06-30 – 2018-07-03 (×13): 100 ug via INTRAVENOUS
  Filled 2018-06-29 (×13): qty 2

## 2018-06-29 MED ORDER — INSULIN ASPART 100 UNIT/ML ~~LOC~~ SOLN
0.0000 [IU] | SUBCUTANEOUS | Status: DC
  Administered 2018-06-29: 3 [IU] via SUBCUTANEOUS
  Administered 2018-06-30 (×2): 2 [IU] via SUBCUTANEOUS
  Administered 2018-06-30 (×2): 1 [IU] via SUBCUTANEOUS
  Administered 2018-06-30: 2 [IU] via SUBCUTANEOUS
  Administered 2018-06-30: 1 [IU] via SUBCUTANEOUS
  Administered 2018-07-01: 2 [IU] via SUBCUTANEOUS
  Administered 2018-07-01: 3 [IU] via SUBCUTANEOUS
  Administered 2018-07-01: 2 [IU] via SUBCUTANEOUS
  Administered 2018-07-01: 5 [IU] via SUBCUTANEOUS
  Administered 2018-07-01 – 2018-07-02 (×5): 2 [IU] via SUBCUTANEOUS
  Administered 2018-07-02: 1 [IU] via SUBCUTANEOUS
  Administered 2018-07-02: 2 [IU] via SUBCUTANEOUS
  Administered 2018-07-02: 1 [IU] via SUBCUTANEOUS
  Administered 2018-07-03: 2 [IU] via SUBCUTANEOUS
  Administered 2018-07-03 – 2018-07-04 (×7): 1 [IU] via SUBCUTANEOUS
  Administered 2018-07-05: 2 [IU] via SUBCUTANEOUS
  Administered 2018-07-05: 1 [IU] via SUBCUTANEOUS
  Administered 2018-07-05: 2 [IU] via SUBCUTANEOUS
  Administered 2018-07-05: 1 [IU] via SUBCUTANEOUS
  Administered 2018-07-06 (×4): 2 [IU] via SUBCUTANEOUS
  Administered 2018-07-06 (×2): 1 [IU] via SUBCUTANEOUS
  Administered 2018-07-07: 2 [IU] via SUBCUTANEOUS
  Administered 2018-07-07: 3 [IU] via SUBCUTANEOUS
  Administered 2018-07-07: 1 [IU] via SUBCUTANEOUS
  Administered 2018-07-07 (×3): 2 [IU] via SUBCUTANEOUS
  Administered 2018-07-07 – 2018-07-08 (×2): 1 [IU] via SUBCUTANEOUS
  Administered 2018-07-08 (×3): 2 [IU] via SUBCUTANEOUS
  Administered 2018-07-08: 1 [IU] via SUBCUTANEOUS
  Administered 2018-07-08: 2 [IU] via SUBCUTANEOUS
  Administered 2018-07-09 (×2): 1 [IU] via SUBCUTANEOUS
  Administered 2018-07-09 – 2018-07-10 (×3): 2 [IU] via SUBCUTANEOUS
  Administered 2018-07-10: 1 [IU] via SUBCUTANEOUS
  Administered 2018-07-10 (×2): 2 [IU] via SUBCUTANEOUS
  Administered 2018-07-10: 1 [IU] via SUBCUTANEOUS
  Administered 2018-07-10 – 2018-07-11 (×2): 2 [IU] via SUBCUTANEOUS
  Administered 2018-07-11: 1 [IU] via SUBCUTANEOUS
  Administered 2018-07-11: 2 [IU] via SUBCUTANEOUS
  Administered 2018-07-11: 1 [IU] via SUBCUTANEOUS
  Administered 2018-07-12 (×4): 2 [IU] via SUBCUTANEOUS
  Administered 2018-07-12 – 2018-07-13 (×2): 1 [IU] via SUBCUTANEOUS
  Administered 2018-07-13: 3 [IU] via SUBCUTANEOUS
  Administered 2018-07-13: 1 [IU] via SUBCUTANEOUS
  Administered 2018-07-13: 2 [IU] via SUBCUTANEOUS
  Administered 2018-07-14 (×3): 1 [IU] via SUBCUTANEOUS
  Administered 2018-07-14: 2 [IU] via SUBCUTANEOUS
  Administered 2018-07-14: 1 [IU] via SUBCUTANEOUS
  Administered 2018-07-15: 2 [IU] via SUBCUTANEOUS
  Administered 2018-07-15: 1 [IU] via SUBCUTANEOUS
  Administered 2018-07-15: 2 [IU] via SUBCUTANEOUS

## 2018-06-29 MED ORDER — LACTULOSE ENEMA
300.0000 mL | Freq: Two times a day (BID) | ORAL | Status: DC
  Filled 2018-06-29 (×2): qty 300

## 2018-06-29 MED ORDER — METOPROLOL TARTRATE 25 MG PO TABS: 25.0000 mg | ORAL_TABLET | Freq: Two times a day (BID) | ORAL | Status: DC

## 2018-06-29 MED ORDER — NOREPINEPHRINE 16 MG/250ML-% IV SOLN: 0.0000 ug/min | INTRAVENOUS | Status: DC

## 2018-06-29 MED ORDER — LACTULOSE 10 GM/15ML PO SOLN
30.0000 g | Freq: Two times a day (BID) | ORAL | Status: DC
  Administered 2018-06-29 – 2018-07-01 (×4): 30 g
  Filled 2018-06-29 (×4): qty 45

## 2018-06-29 MED ORDER — CHLORHEXIDINE GLUCONATE 0.12% ORAL RINSE (MEDLINE KIT)
15.0000 mL | Freq: Two times a day (BID) | OROMUCOSAL | Status: DC
  Administered 2018-06-29 – 2018-07-23 (×48): 15 mL via OROMUCOSAL

## 2018-06-29 MED ORDER — VITAL HIGH PROTEIN PO LIQD
1000.0000 mL | ORAL | Status: DC
  Administered 2018-07-01: 1000 mL

## 2018-06-29 NOTE — Consult Note (Addendum)
PULMONARY / CRITICAL CARE MEDICINE   Name: Brandi [name] MRN: [id number] DOB: [birth year]    ADMISSION DATE:  06/23/2018 CONSULTATION DATE: 06/29/2018  REFERRING MD: Dr. Bonner Puna  CHIEF COMPLAINT: Altered mental status  HISTORY OF PRESENT ILLNESS:  62 y/o F who presented to [facility] on 7/9 with abdominal pain, nausea, vomiting, decreased oral intake and weakness.    She was reportedly evaluated by her PCP prior to admit with elevated LFT's (AST 302, ALT 611, AP 380).  Follow up CT of the abdomen demonstrated prominent caudate lobe and surface irregularity of the liver.   In the ER, US of the abdomen confirmed the CT findings of the liver.  She was noted to be lethargic and have asterixis on exam.  Ammonia 22 (7/10) > 38 (7/12).  The patient had AFwRVR and was treated with cardizem.  She was admitted for further evaluation.  The patient was seen by Cardiology and transitioned to metoprolol for rate control due to decreased LVEF (30-35%). Labs showed a microcytic anemia with ferritin of 9.  GI was consulted for evaluation with recommendations for lactulose and eventual endoscopic evaluation.  She had ongoing encephalopathy and a CT of the head was obtained which showed a right frontal operculum CVA.  Neurology evaluated the patient and felt the stroke was likely related to AF but not the cause of her mental status.  She was recommended anticoagulation and passed a swallow evaluation converting from heparin to eliquis.  On 7/15, her confusion, tardive dyskinesia symptoms and lethargy worsened as well as her LFT's (AST 3942, ALT 1976, ALK Phos 404), renal function and WBC.  PCCM consulted for evaluation.    PAST MEDICAL HISTORY :  She  has a past medical history of Allergic rhinitis, Anemia, Arthritis (HX RIGHT ANKLE FX), Cancer ([place]), Depression, Dyslipidemia, GERD (gastroesophageal reflux disease), History of breast cancer (DX 2010--  S/P LUMPECTOMY AND RADIATION -- NO RECURRENCE), Hypertension, Left flank  pain, Left ureteral calculus, Migraines, Schizophrenic disorder ([place]), Tonic-clonic seizure disorder ([place]) (X1  2010--  NO SEIZURE SINCE), and Type II or unspecified type diabetes mellitus without mention of complication, not stated as uncontrolled (03/16/2014).  PAST SURGICAL HISTORY: She  has a past surgical history that includes CHILD BIRTH; Breast surgery (01-04-2009  [facility]); Cesarean section (1998); Laparoscopic cholecystectomy (1990'S); Cystoscopy with retrograde pyelogram, ureteroscopy and stent placement (11/10/2012); Holmium laser application (63/78/5885); and Cataract extraction, bilateral (Bilateral).  Allergies  Allergen Reactions  . Penicillins Rash    Has patient had a PCN reaction causing immediate rash, facial/tongue/throat swelling, SOB or lightheadedness with hypotension: No Has patient had a PCN reaction causing severe rash involving mucus membranes or skin necrosis: No Has patient had a PCN reaction that required hospitalization No Has patient had a PCN reaction occurring within the last 10 years: No If all of the above answers are "NO", then may proceed with Cephalosporin use.     No current facility-administered medications on file prior to encounter.    Current Outpatient Medications on File Prior to Encounter  Medication Sig  . aspirin EC 81 MG EC tablet Take 1 tablet (81 mg total) by mouth daily.  Marland Kitchen atorvastatin (LIPITOR) 20 MG tablet Take 1 tablet (20 mg total) by mouth daily.  . busPIRone (BUSPAR) 30 MG tablet Take 15-30 mg by mouth See admin instructions. 15 mg every morning, 30 mg every evening  . calcium-vitamin D (OSCAL WITH D) 250-125 MG-UNIT per tablet Take 2 tablets by mouth daily.  Marland Kitchen dicyclomine (BENTYL)  20 MG tablet Take 1 tablet (20 mg total) by mouth every 8 (eight) hours as needed for spasms.  Marland Kitchen LATUDA 120 MG TABS Take 120 mg by mouth daily.   Marland Kitchen levETIRAcetam (KEPPRA) 500 MG tablet TAKE 2 TABLETS (1,000 MG TOTAL) BY MOUTH 2 (TWO) TIMES DAILY.  Marland Kitchen  lisinopril (PRINIVIL,ZESTRIL) 5 MG tablet TAKE 1 TABLET (5 MG TOTAL) BY MOUTH DAILY.  Marland Kitchen LORazepam (ATIVAN) 0.5 MG tablet Take 0.5-1 mg by mouth 2 (two) times daily. 1 tablet every morning, and 2 tablets at bedtime as needed for anxiety  . omeprazole (PRILOSEC) 20 MG capsule Take 1 capsule (20 mg total) by mouth daily.  . ondansetron (ZOFRAN ODT) 4 MG disintegrating tablet Take 1 tablet (4 mg total) by mouth every 8 (eight) hours as needed for nausea or vomiting.  . ondansetron (ZOFRAN) 4 MG tablet Take 1 tablet (4 mg total) by mouth every 8 (eight) hours as needed for nausea or vomiting.  . venlafaxine XR (EFFEXOR-XR) 150 MG 24 hr capsule Take 300 mg by mouth daily.   Marland Kitchen VIMPAT 100 MG TABS TAKE 1 TABLET BY MOUTH TWICE A DAY  . ONETOUCH DELICA LANCETS FINE MISC DX: E11.9  Patient is to test one time a day    FAMILY HISTORY:  Her family history includes Cancer in her father, maternal grandmother, mother, paternal grandmother, and sister; Mental illness in her maternal uncle; Stroke in her maternal grandmother; Tuberculosis in her maternal grandfather. There is no history of Seizures.  SOCIAL HISTORY: She  reports that she has never smoked. She has never used smokeless tobacco. She reports that she does not drink alcohol or use drugs.  REVIEW OF SYSTEMS:   Unable to complete as patient is altered. Information obtained from prior medical documentation, staff at bedside.   SUBJECTIVE:    VITAL SIGNS: BP 105/88 (BP Location: Left Arm)   Pulse 100   Temp 99.1 F (37.3 C) (Axillary)   Resp (!) 27   Ht 5' (1.524 m)   Wt 162 lb 11.2 oz (73.8 kg)   SpO2 100%   BMI 31.78 kg/m   HEMODYNAMICS:    VENTILATOR SETTINGS:    INTAKE / OUTPUT: I/O last 3 completed shifts: In: 362.9 [P.O.:240; I.V.:122.9] Out: -   PHYSICAL EXAMINATION: General: ill appearing female lying in bed Neuro:  Pinpoint pupils, tardive dyskinesia movements noted, agitated delirium HEENT:  MM dry, diaphoretic,  abnormal tongue movements Cardiovascular:  s1s2 RRR, tachy, no m/r/g Lungs:  Mild tachypnea, no significant increase in work of breathing, lungs clear anterior, diminished bases Abdomen:  Soft, bsx4 active  Musculoskeletal:  No acute deformities Skin:  Warm/pale, no rashes/lesions  LABS:  BMET Recent Labs  Lab 06/27/18 0614 06/28/18 0443 06/29/18 0755  NA 145 148* 157*  K 4.0 3.5 5.3*  CL 114* 117* 122*  CO2 22 16* 15*  BUN 14 14 28*  CREATININE 0.91 1.02* 1.90*  GLUCOSE 117* 157* 66*    Electrolytes Recent Labs  Lab 06/23/18 0432  06/27/18 0614 06/28/18 0443 06/29/18 0755  CALCIUM  --    < > 8.7* 8.9 9.7  MG 1.8  --   --   --   --    < > = values in this interval not displayed.    CBC Recent Labs  Lab 06/27/18 0614 06/28/18 0443 06/29/18 0755  WBC 8.8 9.7 31.9*  HGB 9.1* 9.4* 8.9*  HCT 32.6* 35.1* 33.1*  PLT 441* 347 415*    Coag's Recent Labs  Lab 06/24/18 0312 06/25/18 0335 06/27/18 0614  INR 1.37 1.35 1.53    Sepsis Markers No results for input(s): LATICACIDVEN, PROCALCITON, O2SATVEN in the last 168 hours.  ABG No results for input(s): PHART, PCO2ART, PO2ART in the last 168 hours.  Liver Enzymes Recent Labs  Lab 06/27/18 0614 06/28/18 0443 06/29/18 0755  AST 56* 78* 3,365*  ALT 75* 88* 1,825*  ALKPHOS 374* 435* 409*  BILITOT 1.5* 1.8* 2.9*  ALBUMIN 3.0* 3.1* 3.2*    Cardiac Enzymes No results for input(s): TROPONINI, PROBNP in the last 168 hours.  Glucose Recent Labs  Lab 06/28/18 1634 06/28/18 2117 06/29/18 0613 06/29/18 0745 06/29/18 1020 06/29/18 1124  GLUCAP 118* 100* 61* 72 109* 74    Imaging No results found.   STUDIES:  CT Head 7/9 >> atrophy with small vessel chronic ischemic changes of deep cerebral white matter CT Renal Study 7/9 >> no acute findings, bilateral non-obstructing renal stones ECHO 7/9 >> LV mildly dilated, wall thickness was increased in a pattern of moderate LVH, LVEF 30-35%, severe  hypokinesis of the anteroseptal, anterior, inferoseptal & apical myocardium, mild to moderate MV regurgitation, LA severely dilated, RV moderately dilated, RA mod-severely dilated, PA pressure 59mHg, trivial pericardial effusion. CTA Head, Neck 7/12 >> minimal calcified plaque at the carotid bifurcation regions but no stenosis or irregularity, no large vessel occlusions, unable to specifically identify the missing branch vessel responsible for the right posterior frontal infarction but this is presumably an occluded M3 or distal branch CT Head w/o 7/12 >> the small right middle cerebral artery distribution, posterolateral frontal lobe infarct has evolved since the prior CT, it is now seen as a well defined area of hypoattenuation, no new areas of infarct, no ICH  CULTURES: BCx2 7/10 >> negative   ANTIBIOTICS: Vanco 7/15 >>  Zoysn 7/15 >>   SIGNIFICANT EVENTS: 7/09  Admit  7/15  PCCM consulted with AMS, acute liver failure  LINES/TUBES: ETT 7/15 >>  DISCUSSION: 62y/o F admitted with weakness, decreased intake and lethargy.  [place] concerning [place]  She developed AMS > small infarct thought not related to AMS.  Recommended for anticoagulation by Cardiology/Neurology, was transitioned from heparin to eliquis.  The patient deteriorated on 7/15 with a rise in LFT's, AKI, confusion/AMS, worsening of tardive dyskinesia type symptoms.  Transferred to ICU.    ASSESSMENT / PLAN:  PULMONARY A: Acute Respiratory Insufficiency - in setting of liver failure  Bilateral Pleural Effusions - suspect in setting of liver disease, HF, AKI P:   Transfer to ICU for intubation / stabilization  PRVC 8 cc/kg Wean PEEP/FiO2 for sats > 90% CXR post intubation and in am  ABG post intubation and in am   CARDIOVASCULAR A:  HFrEF A. fib with RVR HTN P:  Transfer to ICU  Return to heparin gtt once CT head completed (and negative),  central access established and INR returned (likely will be  elevated / indirect liver function assessment, if greater than 2 do not start)  Assess BNP, troponin  Place central access  SCD's  Levophed if needed to maintain MAP > 65  RENAL A:   AKI - in setting of liver injury, NASH P:   Trend BMP / urinary output Replace electrolytes as indicated Avoid nephrotoxic agents, ensure adequate renal perfusion  Free water 200 ml Q8  GASTROINTESTINAL A:   [place] Cirrhosis with Hepatic Encephalopathy GERD P:   NPO  Insert OGT  Change lactulose to per tube  Begin  trickle TF this PM STAT US of liver with doppler  Assess INR   HEMATOLOGIC A:   Iron Deficiency Anemia  Hx Breast Cancer P:  Trend CBC  Transfuse per ICU guidelines  D/C heparin due to upper GI until evaluated by GI  INFECTIOUS A:   Rule Out Sepsis - SBP?, other etiology  P:   Re-culture 7/15 Empiric vanco, zosyn Follow fever curve / WBC trend   ENDOCRINE A:   DM 2 Hypoglycemia  P:   SSI, sensitive scale   NEUROLOGIC A:   Acute metabolic encephalopathy Acute delirium Right Frontal Operculum infarct - felt related to AF/embolic but not all cause of mental status Right-sided weakness Seizure disorder  Schizophrenia  P: RASS goal: 0 to -1  Hold Buspar, Effexor > restart once acute phase illness resolved as can withdrawal PRN fentanyl / versed only for sedation  Minimize sedating medications  Early PT efforts as able   FAMILY  - Updates: Husband updated via phone on transfer / plan of care.  Will update in person on his arrival.   - Inter-disciplinary family meet or Palliative Care meeting due by: Discussed acute illness with husband via phone.  He indicates if this is a reversible process, she would want to continue aggressive support.  If she were not going to improve, she would not want prolonged ventilation. Full Code.     Noe Gens, NP-C [facility] Pgr: [phone] or if no answer [phone] 06/29/2018, 11:40 AM  Attending  Note:  62 year old female with extensive PMH to include NASH who presents with a cute on chronic liver failure, AMS and respiratory failure due to inability to protect her airway.  On exam, she is unresponsive, not protecting her airway with bloody vomitus from the esophagus during intubation.  I reviewed CXR myself, no acute disease noted.  Move to the ICU.  Emergent intubation today.  ABG post intubation.  Adjust vent for ABG.  Spoke with family, code status confirmed.  GI consulted for GI bleed and liver failure.  RUQ U/S ordered and pending.  Broad spectrum abx as ordered.  Discussed with PCCM-NP.  Hold heparin.  GI MD notified of events (Dr. Collene Mares) and will come to see patient.  The patient is critically ill with multiple organ systems failure and requires high complexity decision making for assessment and support, frequent evaluation and titration of therapies, application of advanced monitoring technologies and extensive interpretation of multiple databases.   Critical Care Time devoted to patient care services described in this note is  45  Minutes. This time reflects time of care of this signee Dr [name]. This critical care time does not reflect procedure time, or teaching time or supervisory time of PA/NP/Med student/Med Resident etc but could involve care discussion time.  Rush Farmer, M.D. [facility] Pulmonary/Critical Care Medicine. Pager: [phone]. After hours pager: [phone].

## 2018-06-29 NOTE — Consult Note (Signed)
Reason for Consult: GI bleed with worsening LFT's. Referring Physician: THP.  Brandi [name] is an 62 y.o. female.  HPI: 62 year old white female with multiple medical issues listed below, admitted for generalized weakness with nausea, anorexia and SOB with palpitations. 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  She was noted to have heterogenous liver enlargement with a prominent caudate lobe and fissural enlargement along with surface lobulation. This was complicate by the development of atrial fibrillation with RVR on admission for which she was started on a Diltiazem drip. CT of the head was negative for acute abnormalities in the CT done by renal protocol showed bilateral nonobstructing renal stones drug screen was positive for barbiturates. On 06/24/2018 patient was found to be drowsy and not very responsive. During this time she had converted to  normal sinus rhythm. She was seen by Dr. Carol Ada on 06/26/2018 and was cleared for anticoagulation and was started on lactulose. On 06/26/2018 she was seen by Dr. Antony Contras was diagnosed with a right frontal opercular stroke. 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 Earlier today she was transferred to the ICU because of respiratory infant's insufficiency and altered mental status requiring intubation. .Orogastric tube was placed and dark blood was noted and the tube. Her hemoglobin today is 8.9 g/dL. Patient was hypotensive for a while was on pressors which have beans which have since been discontinued as her blood pressures have stabilized. The Eliquis that was started for the atrial fibrillation is now been held. Her INR today is 8.74. Her am AST and ALT are 3942 and 1976 respectively slightly increased since yesterday. Shock liver was suspected and liver Dopplers were done to were essentially within normal limits with no evidence of portal  venous thrombosis. Multiple family members are at her bedside, including her husband and daughter.  Past Medical History:  Diagnosis Date  . Allergic rhinitis   . Anemia   . Arthritis HX RIGHT ANKLE FX  . Cancer [facility])    breast, lumpectomy  . Depression    MAJOR  . Dyslipidemia   . GERD (gastroesophageal reflux disease)   . History of breast cancer DX 2010--  S/P LUMPECTOMY AND RADIATION -- NO RECURRENCE   ONCOLOGIST- DR Truddie Coco  . Hypertension   . Left flank pain   . Left ureteral calculus   . Migraines   . Schizophrenic disorder ([place])   . 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 Type II or unspecified type diabetes mellitus without mention of complication, not stated as uncontrolled 03/16/2014   Past Surgical History:  Procedure Laterality Date  . BREAST SURGERY  01-04-2009  DR Margot Chimes   LEFT BREAST LUMPECTOMY W/ SLN DISSECTION  FOR CANCER  . CATARACT EXTRACTION, BILATERAL Bilateral   . CESAREAN SECTION  1998  . CHILD BIRTH     C-SECTION  . CYSTOSCOPY WITH RETROGRADE PYELOGRAM, URETEROSCOPY AND STENT PLACEMENT  11/10/2012   Procedure: CYSTOSCOPY WITH RETROGRADE PYELOGRAM, URETEROSCOPY AND STENT PLACEMENT;  Surgeon: Hanley Ben, MD;  Location: [place];  Service: Urology;  Laterality: Left;  WITH DOUBLE J STENT  . HOLMIUM LASER APPLICATION  83/66/2947   Procedure: HOLMIUM LASER APPLICATION;  Surgeon: Hanley Ben, MD;  Location: [place];  Service: Urology;  Laterality: Left;  . LAPAROSCOPIC CHOLECYSTECTOMY  1990'S   Family History  Problem  Relation Age of Onset  . Cancer Sister        breast  . Cancer Mother        breast  . Cancer Father        lung  . Mental illness Maternal Uncle   . Cancer Maternal Grandmother   . Stroke Maternal Grandmother   . Tuberculosis Maternal Grandfather   . Cancer Paternal Grandmother   . Seizures Neg Hx    Social History:  reports that she has never smoked. She has  never used smokeless tobacco. She reports that she does not drink alcohol or use drugs.  Allergies:  Allergies  Allergen Reactions  . Penicillins Rash    Has patient had a PCN reaction causing immediate rash, facial/tongue/throat swelling, SOB or lightheadedness with hypotension: No Has patient had a PCN reaction causing severe rash involving mucus membranes or skin necrosis: No Has patient had a PCN reaction that required hospitalization No Has patient had a PCN reaction occurring within the last 10 years: No If all of the above answers are "NO", then may proceed with Cephalosporin use.    Medications: I have reviewed the patient's current medications.  Results for orders placed or performed during the hospital encounter of 06/23/18 (from the past 48 hour(s))  Glucose, capillary     Status: Abnormal   Collection Time: 06/27/18 10:37 PM  Result Value Ref Range   Glucose-Capillary 143 (H) 70 - 99 mg/dL  CBC with Differential/Platelet     Status: Abnormal   Collection Time: 06/28/18  4:43 AM  Result Value Ref Range   WBC 9.7 4.0 - 10.5 K/uL   RBC 5.04 3.87 - 5.11 MIL/uL   Hemoglobin 9.4 (L) 12.0 - 15.0 g/dL   HCT 35.1 (L) 36.0 - 46.0 %   MCV 69.6 (L) 78.0 - 100.0 fL   MCH 18.7 (L) 26.0 - 34.0 pg   MCHC 26.8 (L) 30.0 - 36.0 g/dL   RDW 21.2 (H) 11.5 - 15.5 %   Platelets 347 150 - 400 K/uL   Neutrophils Relative % 72 %   Lymphocytes Relative 15 %   Monocytes Relative 11 %   Eosinophils Relative 1 %   Basophils Relative 1 %   Neutro Abs 6.9 1.7 - 7.7 K/uL   Lymphs Abs 1.5 0.7 - 4.0 K/uL   Monocytes Absolute 1.1 (H) 0.1 - 1.0 K/uL   Eosinophils Absolute 0.1 0.0 - 0.7 K/uL   Basophils Absolute 0.1 0.0 - 0.1 K/uL   RBC Morphology POLYCHROMASIA PRESENT     Comment: TARGET CELLS ELLIPTOCYTES MIXED RBC POPULATION Performed at [facility], [address] [place], [place] [address]   Comprehensive metabolic panel     Status: Abnormal   Collection Time: 06/28/18  4:43 AM   Result Value Ref Range   Sodium 148 (H) 135 - 145 mmol/L   Potassium 3.5 3.5 - 5.1 mmol/L   Chloride 117 (H) 98 - 111 mmol/L    Comment: Please note change in reference range.   CO2 16 (L) 22 - 32 mmol/L   Glucose, Bld 157 (H) 70 - 99 mg/dL    Comment: Please note change in reference range.   BUN 14 8 - 23 mg/dL    Comment: Please note change in reference range.   Creatinine, Ser 1.02 (H) 0.44 - 1.00 mg/dL   Calcium 8.9 8.9 - 10.3 mg/dL   Total Protein 5.7 (L) 6.5 - 8.1 g/dL   Albumin 3.1 (L) 3.5 -  5.0 g/dL   AST 78 (H) 15 - 41 U/L   ALT 88 (H) 0 - 44 U/L    Comment: Please note change in reference range.   Alkaline Phosphatase 435 (H) 38 - 126 U/L   Total Bilirubin 1.8 (H) 0.3 - 1.2 mg/dL   GFR calc non Af Amer 58 (L) >60 mL/min   GFR calc Af Amer >60 >60 mL/min    Comment: (NOTE) The eGFR has been calculated using the CKD EPI equation. This calculation has not been validated in all clinical situations. eGFR's persistently <60 mL/min signify possible Chronic Kidney Disease.    Anion gap 15 5 - 15    Comment: Performed at [facility] [address] [place], [place] [address]  Heparin level (unfractionated)     Status: Abnormal   Collection Time: 06/28/18  4:43 AM  Result Value Ref Range   Heparin Unfractionated 0.75 (H) 0.30 - 0.70 IU/mL    Comment: (NOTE) If heparin results are below expected values, and patient dosage has  been confirmed, suggest follow up testing of antithrombin III levels. Performed at [facility], [place] [address], [place], [place] [address]   Glucose, capillary     Status: Abnormal   Collection Time: 06/28/18  6:07 AM  Result Value Ref Range   Glucose-Capillary 137 (H) 70 - 99 mg/dL   Comment 1 Notify RN    Comment 2 Document in Chart   Heparin level (unfractionated)     Status: Abnormal   Collection Time: 06/28/18  6:20 AM  Result Value Ref Range   Heparin Unfractionated 0.71 (H) 0.30 - 0.70 IU/mL    Comment: (NOTE) If heparin  results are below expected values, and patient dosage has  been confirmed, suggest follow up testing of antithrombin III levels. Performed at [facility], [place] [address], [place], [place] [address]   Glucose, capillary     Status: Abnormal   Collection Time: 06/28/18 11:27 AM  Result Value Ref Range   Glucose-Capillary 103 (H) 70 - 99 mg/dL  Glucose, capillary     Status: Abnormal   Collection Time: 06/28/18  4:34 PM  Result Value Ref Range   Glucose-Capillary 118 (H) 70 - 99 mg/dL  Glucose, capillary     Status: Abnormal   Collection Time: 06/28/18  9:17 PM  Result Value Ref Range   Glucose-Capillary 100 (H) 70 - 99 mg/dL  Glucose, capillary     Status: Abnormal   Collection Time: 06/29/18  6:13 AM  Result Value Ref Range   Glucose-Capillary 61 (L) 70 - 99 mg/dL  Glucose, capillary     Status: None   Collection Time: 06/29/18  7:45 AM  Result Value Ref Range   Glucose-Capillary 72 70 - 99 mg/dL  CBC with Differential/Platelet     Status: Abnormal   Collection Time: 06/29/18  7:55 AM  Result Value Ref Range   WBC 31.9 (H) 4.0 - 10.5 K/uL   RBC 4.67 3.87 - 5.11 MIL/uL   Hemoglobin 8.9 (L) 12.0 - 15.0 g/dL   HCT 33.1 (L) 36.0 - 46.0 %   MCV 70.9 (L) 78.0 - 100.0 fL   MCH 19.1 (L) 26.0 - 34.0 pg   MCHC 26.9 (L) 30.0 - 36.0 g/dL   RDW 21.5 (H) 11.5 - 15.5 %   Platelets 415 (H) 150 - 400 K/uL   Neutrophils Relative % 90 %   Lymphocytes Relative 3 %   Monocytes Relative 7 %  Eosinophils Relative 0 %   Basophils Relative 0 %   Neutro Abs 28.7 (H) 1.7 - 7.7 K/uL   Lymphs Abs 1.0 0.7 - 4.0 K/uL   Monocytes Absolute 2.2 (H) 0.1 - 1.0 K/uL   Eosinophils Absolute 0.0 0.0 - 0.7 K/uL   Basophils Absolute 0.0 0.0 - 0.1 K/uL   RBC Morphology TARGET CELLS     Comment: ELLIPTOCYTES BURR CELLS Spherocytes present Acanthocytes present    WBC Morphology INCREASED BANDS (>20% BANDS)     Comment: Performed at [facility] [address], [place], [place] [address]   Comprehensive metabolic panel     Status: Abnormal   Collection Time: 06/29/18  7:55 AM  Result Value Ref Range   Sodium 157 (H) 135 - 145 mmol/L   Potassium 5.3 (H) 3.5 - 5.1 mmol/L   Chloride 122 (H) 98 - 111 mmol/L    Comment: Please note change in reference range.   CO2 15 (L) 22 - 32 mmol/L   Glucose, Bld 66 (L) 70 - 99 mg/dL    Comment: Please note change in reference range.   BUN 28 (H) 8 - 23 mg/dL    Comment: Please note change in reference range.   Creatinine, Ser 1.90 (H) 0.44 - 1.00 mg/dL   Calcium 9.7 8.9 - 10.3 mg/dL   Total Protein 5.4 (L) 6.5 - 8.1 g/dL   Albumin 3.2 (L) 3.5 - 5.0 g/dL   AST 3,365 (H) 15 - 41 U/L    Comment: RESULTS CONFIRMED BY MANUAL DILUTION   ALT 1,825 (H) 0 - 44 U/L    Comment: Please note change in reference range.   Alkaline Phosphatase 409 (H) 38 - 126 U/L   Total Bilirubin 2.9 (H) 0.3 - 1.2 mg/dL   GFR calc non Af Amer 27 (L) >60 mL/min   GFR calc Af Amer 32 (L) >60 mL/min    Comment: (NOTE) The eGFR has been calculated using the CKD EPI equation. This calculation has not been validated in all clinical situations. eGFR's persistently <60 mL/min signify possible Chronic Kidney Disease.    Anion gap 20 (H) 5 - 15    Comment: Performed at [facility], [place] [address], [place], [place] [address]  Glucose, capillary     Status: Abnormal   Collection Time: 06/29/18 10:20 AM  Result Value Ref Range   Glucose-Capillary 109 (H) 70 - 99 mg/dL  Comprehensive metabolic panel     Status: Abnormal   Collection Time: 06/29/18 11:11 AM  Result Value Ref Range   Sodium 156 (H) 135 - 145 mmol/L   Potassium 5.3 (H) 3.5 - 5.1 mmol/L   Chloride 123 (H) 98 - 111 mmol/L    Comment: Please note change in reference range.   CO2 12 (L) 22 - 32 mmol/L   Glucose, Bld 95 70 - 99 mg/dL    Comment: Please note change in reference range.   BUN 32 (H) 8 - 23 mg/dL    Comment: Please note change in reference range.   Creatinine, Ser 2.14 (H) 0.44 - 1.00  mg/dL   Calcium 9.5 8.9 - 10.3 mg/dL   Total Protein 5.4 (L) 6.5 - 8.1 g/dL   Albumin 3.3 (L) 3.5 - 5.0 g/dL   AST 3,942 (H) 15 - 41 U/L    Comment: RESULTS CONFIRMED BY MANUAL DILUTION   ALT 1,976 (H) 0 - 44 U/L    Comment: Please note change in reference range.  Alkaline Phosphatase 404 (H) 38 - 126 U/L   Total Bilirubin 2.9 (H) 0.3 - 1.2 mg/dL   GFR calc non Af Amer 24 (L) >60 mL/min   GFR calc Af Amer 27 (L) >60 mL/min    Comment: (NOTE) The eGFR has been calculated using the CKD EPI equation. This calculation has not been validated in all clinical situations. eGFR's persistently <60 mL/min signify possible Chronic Kidney Disease.    Anion gap 21 (H) 5 - 15    Comment: Performed at [facility], [place] [address], [place], [place] [address]  Glucose, capillary     Status: None   Collection Time: 06/29/18 11:24 AM  Result Value Ref Range   Glucose-Capillary 74 70 - 99 mg/dL  Lactic acid, plasma     Status: Abnormal   Collection Time: 06/29/18 12:44 PM  Result Value Ref Range   Lactic Acid, Venous 10.8 (HH) 0.5 - 1.9 mmol/L    Comment: RESULTS CONFIRMED BY MANUAL DILUTION CRITICAL RESULT CALLED TO, READ BACK BY AND VERIFIED WITH: Theodis Aguas RN 1410 06/29/2018 BY A BENNETT Performed at [facility], [place] [address], [place], [place] [address]   Ammonia     Status: Abnormal   Collection Time: 06/29/18 12:44 PM  Result Value Ref Range   Ammonia 96 (H) 9 - 35 umol/L    Comment: Performed at [facility], [place] [address] [place], [place] [address]  Glucose, capillary     Status: None   Collection Time: 06/29/18  3:17 PM  Result Value Ref Range   Glucose-Capillary 93 70 - 99 mg/dL  Protime-INR     Status: Abnormal   Collection Time: 06/29/18  4:05 PM  Result Value Ref Range   Prothrombin Time 71.2 (H) 11.4 - 15.2 seconds    Comment: REPEATED TO VERIFY   INR 8.74 (HH)     Comment: REPEATED TO VERIFY CRITICAL RESULT CALLED TO, READ BACK BY AND VERIFIED  WITH: J.WHITE,RN 1727 06/29/18 M.CAMPBELL Performed at [facility], [place] [address], [place], [place] [address]   Brain natriuretic peptide     Status: Abnormal   Collection Time: 06/29/18  4:05 PM  Result Value Ref Range   B Natriuretic Peptide 2,383.4 (H) 0.0 - 100.0 pg/mL    Comment: Performed at [facility] [address], [place], [place] [address]  Troponin I (q 6hr x 3)     Status: Abnormal   Collection Time: 06/29/18  4:05 PM  Result Value Ref Range   Troponin I 0.20 (HH) <0.03 ng/mL    Comment: CRITICAL RESULT CALLED TO, READ BACK BY AND VERIFIED WITH: D WHITE,RN 1723 06/29/2018 WBOND Performed at [facility], [place] [address] [place], [place] [address]   Lactic acid, plasma     Status: Abnormal   Collection Time: 06/29/18  4:05 PM  Result Value Ref Range   Lactic Acid, Venous 8.3 (HH) 0.5 - 1.9 mmol/L    Comment: CRITICAL RESULT CALLED TO, READ BACK BY AND VERIFIED WITH: D WHITE,RN 1723 06/29/2018 WBOND Performed at [facility], [place] [address] [place],  [address]   Basic metabolic panel     Status: Abnormal   Collection Time: 06/29/18  4:05 PM  Result Value Ref Range   Sodium 153 (H) 135 - 145 mmol/L   Potassium 4.5 3.5 - 5.1 mmol/L   Chloride 121 (H) 98 - 111 mmol/L    Comment: Please note change  in reference range.   CO2 17 (L) 22 - 32 mmol/L   Glucose, Bld 177 (H) 70 - 99 mg/dL    Comment: Please note change in reference range.   BUN 36 (H) 8 - 23 mg/dL    Comment: Please note change in reference range.   Creatinine, Ser 2.31 (H) 0.44 - 1.00 mg/dL   Calcium 9.3 8.9 - 10.3 mg/dL   GFR calc non Af Amer 21 (L) >60 mL/min   GFR calc Af Amer 25 (L) >60 mL/min    Comment: (NOTE) The eGFR has been calculated using the CKD EPI equation. This calculation has not been validated in all clinical situations. eGFR's persistently <60 mL/min signify possible Chronic Kidney Disease.    Anion gap 15 5 - 15    Comment: Performed at [facility] [address] [place], [place] [address]  Magnesium     Status: None   Collection Time: 06/29/18  4:05 PM  Result Value Ref Range   Magnesium 2.1 1.7 - 2.4 mg/dL    Comment: Performed at [facility], [place] [address], [place], [place] [address]  Hemoglobin and hematocrit, blood     Status: Abnormal   Collection Time: 06/29/18  4:05 PM  Result Value Ref Range   Hemoglobin 9.2 (L) 12.0 - 15.0 g/dL   HCT 34.7 (L) 36.0 - 46.0 %    Comment: Performed at [facility], [place] [address] [place], [place] [address]  I-STAT 3, arterial blood gas (G3+)     Status: Abnormal   Collection Time: 06/29/18  4:06 PM  Result Value Ref Range   pH, Arterial 7.263 (L) 7.350 - 7.450   pCO2 arterial 35.2 32.0 - 48.0 mmHg   pO2, Arterial 333.0 (H) 83.0 - 108.0 mmHg   Bicarbonate 15.9 (L) 20.0 - 28.0 mmol/L   TCO2 17 (L) 22 - 32 mmol/L   O2 Saturation 100.0 %   Acid-base deficit 10.0 (H) 0.0 - 2.0 mmol/L   Patient temperature 98.6 F    Collection site ARTERIAL LINE    Drawn by Operator    Sample type ARTERIAL   Glucose, capillary     Status: Abnormal   Collection Time: 06/29/18  5:08 PM  Result Value Ref Range   Glucose-Capillary 161 (H) 70 - 99 mg/dL   ROS; Unable to perform; patient is intubated. Blood pressure (!) 182/100, pulse (!) 128, temperature 99.1 F (37.3 C), temperature source Axillary, resp. rate 20, height _0  (1.575 m), weight 73.8 kg (162 lb 11.2 oz), SpO2 100 %. Physical Exam General: Extremely ill appearing white female, diaphoretic, hypotensive, intubated with old heme in the orogastric tube Neuro: pin point pupils, agitated with dyskinetic movement  HEENT: ATNC Cardiac: sinus tachycardia, S1 and S2 regular Lungs: CTA with diminished breath sounds at the bases Abdomen soft with hypoactive BS  Assessment/Plan: 1)  Altered mental status/NASH cirrhosis-will continue Lactulose through the NGT and hold off on her TF's for now. A repeat CT scan of the head is  being done this evening.  2) Upper GI bleed/GERD-?stress ulceration-started the patient on a Protonix drip, INR >8. No indication for an EGD at this time. I have had a length discussion with the family members about her very critical condition. She is a full code. The family has been encouraged to bring a copy of her living will to keep on file at the hospital.  3) Acute respiratory insufficiency; intubated.Marland Kitchen  4) New onset AF  with RVR currently in sinus rhythm. 5) AKI.  Zell Doucette 06/29/2018, 6:25 PM

## 2018-06-29 NOTE — Progress Notes (Signed)
OT Cancellation Note  Patient Details Name: CAYLEN KUWAHARA MRN: [id number] DOB: 1956/01/23   Cancelled Treatment:    Reason Eval/Treat Not Completed: Medical issues which prohibited therapy. Pt with change in status resulting in transfer to ICU. OT will check back as appropriate.   Norman Herrlich, MS OTR/L  Pager: [place] A Raaga Maeder 06/29/2018, 1:55 PM

## 2018-06-29 NOTE — Progress Notes (Addendum)
PROGRESS NOTE  Brandi [name]  WSF:[id number] DOB: 1956/05/29 DOA: 06/23/2018 PCP: Denita Lung, MD   Brief Narrative: Brandi [name] is a 62 y.o. female with a history of HTN, HLD, seizure disorder, left breast CA s/p lumpectomy 2009, GERD and depression who presented to the ED with abdominal pain, nausea, vomiting, and weakness with decreased per oral intake. She had been evaluated by PCP with elevated LFTs (AST 302, ALT 611, and AP 380) and subsequent CT demonstrating prominent caudate lobe and surface irregularity of the liver. Appearance on U/S in the ED confirmed this. She demonstrated lethargy and asterixis on exam and had elevated ammonia consistent with hepatic encephalopathy. In the ED she was found to have new-onset AFib with RVR which quickly converted to NSR with diltiazem which was subsequently converted to metoprolol due to EF decrease to 30-35% by cardiology. Further evaluation revealed microcytic anemia with ferritin of 9. GI was consulted, recommended lactulose and eventual endoscopic evaluation of iron-deficiency anemia. With ongoing encephalopathy, CT head was checked and revealed a right frontal operculum CVA so patient was transferred to [facility] for stroke evaluation and neurology consultation. Neurology feels this small infarct is embolic due to AFib and not the likely cause of encephalopathy. Anticoagulation was recommended by cardiology for AFib now with CVA, GI has given their blessing for this, and neurology also agrees that anticoagulation is indicated. Heparin was started due to inability of pt to take PO but now converting to eliquis as she's passed a swallow evaluation.   Assessment & Plan: Principal Problem:   Atrial fibrillation with RVR (HCC) Active Problems:   Major depression   Migraine headache   GERD (gastroesophageal reflux disease)   History of breast cancer in female   Hyperlipidemia associated with type 2 diabetes mellitus (HCC)   Leukocytosis   Type 2  diabetes mellitus without complication, without long-term current use of insulin (HCC)   Hypertension associated with diabetes ([place])   Anemia   Elevated LFTs   Malaise   Schizophrenia (HCC)   History of seizures   Nausea and vomiting   Cerebral embolism with cerebral infarction   Paroxysmal atrial fibrillation (HCC)  Acute hypernatremia: Due to dehydration. Was only given lasix x1 4 days ago. Started ARB over the weekend, but suspect she's just not taking much fluids and having insensible losses.  - Start D5W - Nephrology, Dr. Florene Glen, consulted and will see patient.  Shock liver: AST/ALT grossly elevated 78/88 >> 3365 / 1825 in 24hrs. Alk phos not significantly changed. ?embolic infarct.  - Soke with GI, Dr. Collene Mares, who is familiar with the patient and will consult. Agreed with checking STAT liver U/S w/doppler.   - Repeating CMP to verify   NASH cirrhosis with hepatic encephalopathy: EEG with mild generalized slowing.  - Appreciate GI recommendations.  - Continue lactulose at current po dosing. Having BMs but not strictly charted. Will need these quantified to titrate lactulose. Discussed with RN today.  Acute encephalopathy: It appears her lethargy is decreasing with lactulose but she's now displaying waxing/waning mental status changes.  - ADDENDUM: Severe metabolic derangements as seen on today's labs (resulted after initial note written) are likely etiology of worsening encephalopathy. I have asked for PCCM evaluation regarding need for ICU transfer. Appreciate Dr. Matilde Bash assistance. Will treat as below.  - Due to concern for tardive dyskinesia, latuda was stopped.  - Since we are holding the antipsychotic and concerned about tardive dyskinesia and all of her mental status changes are not felt  to be due to hepatic encephalopathy (not improving despite stool output) and the neurologist does not feel the stroke is playing a role in encephalopathy, I have asked for psychiatry evaluation.  I appreciate their assistance  New onset AFib with RVR: Converted to NSR/sinus tachycardia. TSH 0.532. Note biatrial dilatation on echo. - Continue beta blocker, convert back to po.  - Started anticoagulation for a CHA2DS2-VASc score of now 6 with CHF, CVA, DM. Both neurology and gastroenterology consultants have given their blessing for this. She's taking po, so converted to eliquis 7/14.  - Monitor and replace K and Mg as needed. (Pending this AM)  New onset HFrEF: Echo this admission showed EF 30-35% with hypokinesis of the anteroseptal, anterior, inferoseptal, and apical myocardium and severely dilated left and right atria. - Beta blocker, ARB. Holding on diuretic. Will  In fact, may need to start gentle fluids. Discussed with RN the need for accurate I/O.  - Will need ischemic evaluation eventually  Neutrophilic leukocytosis: Reactive vs. infectious.  - Blood cultures from 7/10 negative, will redraw and monitor. Broad coverage if develops fever.   Iron-deficiency anemia: With chronic decrease in hgb and microcytosis. Ferritin low on panel here. Had incomplete colonoscopy previously. No overt GI bleeding noted.  - GI recommends work up once pt more stable.  - Hgb is stable will continue to monitor with inception of anticoagulation  Right frontal operculum infarct: Most likely embolic in light of AFib. - CTA head, neck shows presumably occluded M3 or distal right branch per neurology - Started eliquis for AFib as above.  - Not on statin due to LDL of 28 and cirrhosis - Continue therapy evaluations as able. Passed swallow.   Right-sided weakness: No new infarct/hemorrhage or intracranial explanation. Will continue serial exams as pt's mental status improves.  - Monitor NIHSS, neuro checks q4h - PT/OT once able to participate. DC planned to SNF when better.  Seizure disorder:  - Per neurology, continuing AEDs.  HTN:  - Metoprolol, losartan  T2DM: HbA1c 6.6% - Will DC SSI for now,  currently hypoglycemic and altered so will give D50 x1 and recheck.   DVT prophylaxis: Heparin.  Code Status: Full Family Communication: Discussed with Alvester Chou, her husband who I've spoken with earlier this admission. He is in [place] today, but will leave shortly to come to the hospital. Disposition Plan: Uncertain  Consultants:   Cardiology  Neurology  Gastroenterology  Psychiatry  Procedures:   Echocardiogram - Left ventricle: The cavity size was mildly dilated. Wallthickness was increased in a pattern of moderate LVH. Systolicfunction was moderately to severely reduced. The estimatedejection fraction was in the range of 30% to 35%. Severehypokinesis of the anteroseptal, anterior, inferoseptal, andapical myocardium. - Mitral valve: Mildly to moderately calcified annulus. Mobilitywas moderately restricted. There was mild to moderateregurgitation. - Left atrium: The atrium was severely dilated. - Right ventricle: The cavity size was moderately dilated. Wallthickness was normal. Systolic function was moderately reduced. - Right atrium: The atrium was moderately to severely dilated. - Pulmonary arteries: Systolic pressure was moderately to severelyincreased,estimated at approximately 45 mmHg. - Pericardium, extracardiac: A trivial pericardial effusion wasidentified.  Antimicrobials:  None   Subjective: Restless and would not follow commands today. RN reported BM's yesterday and ativan improved restlessness. No family at bedside this AM.  Objective: Vitals:   06/28/18 1937 06/29/18 0002 06/29/18 0409 06/29/18 0500  BP: 117/72 107/63 107/60   Pulse:      Resp: 18     Temp: (!) 97.4 F (  36.3 C) 98.2 F (36.8 C) 98.6 F (37 C)   TempSrc: Axillary Oral Oral   SpO2:      Weight:    73.8 kg (162 lb 11.2 oz)  Height:        Intake/Output Summary (Last 24 hours) at 06/29/2018 0802 Last data filed at 06/28/2018 1300 Gross per 24 hour  Intake 120 ml  Output -  Net 120  ml   Filed Weights   06/27/18 0500 06/28/18 0500 06/29/18 0500  Weight: 74.9 kg (165 lb 2 oz) 73.2 kg (161 lb 6 oz) 73.8 kg (162 lb 11.2 oz)   Gen: 62 y.o. female restless in bed with eyes closed Pulm: Nonlabored breathing room air. Clear. CV: Regular tachycardia. No murmur, rub, or gallop. No JVD, no dependent edema. GI: Abdomen soft, non-tender, non-distended, with normoactive bowel sounds.  Ext: Warm, no deformities Skin: No rashes, lesions or ulcers on visualized skin.  Neuro: Moving all extremities spontaneously but not with specific intention, opens eyes but not on command. Stereotyped tongue thrusting continues. Unable to completely assess, but evidence of some asterixis when arms outstretched.  Psych: Judgement and insight appear impaired.    Data Reviewed: I have personally reviewed following labs and imaging studies  CBC: Recent Labs  Lab 06/22/18 1350 06/23/18 0119 06/24/18 0312 06/25/18 0539 06/27/18 0614 06/28/18 0443  WBC 15.1* 12.1* 10.1 9.9 8.8 9.7  NEUTROABS 11.5*  --   --  7.2 5.9 6.9  HGB 9.9* 10.1* 9.4* 9.4* 9.1* 9.4*  HCT 32.8* 35.1* 32.9* 33.4* 32.6* 35.1*  MCV 64* 66.7* 67.4* 67.7* 67.4* 69.6*  PLT 555* 600* 544* 558* 441* 323   Basic Metabolic Panel: Recent Labs  Lab 06/23/18 0119 06/23/18 0432 06/24/18 0312 06/25/18 0335 06/27/18 0614 06/28/18 0443  NA 137  --  139 142 145 148*  K 3.2*  --  3.5 4.1 4.0 3.5  CL 98  --  107 110 114* 117*  CO2 27  --  '24 24 22 ' 16*  GLUCOSE 134*  --  130* 136* 117* 157*  BUN 18  --  '12 12 14 14  ' CREATININE 0.87  --  0.75 0.83 0.91 1.02*  CALCIUM 9.0  --  8.3* 8.9 8.7* 8.9  MG  --  1.8  --   --   --   --    GFR: Estimated Creatinine Clearance: 51.3 mL/min (A) (by C-G formula based on SCr of 1.02 mg/dL (H)). Liver Function Tests: Recent Labs  Lab 06/23/18 0119 06/24/18 0312 06/25/18 0335 06/27/18 0614 06/28/18 0443  AST 40 47* 51* 56* 78*  ALT 101* 89* 80* 75* 88*  ALKPHOS 249* 223* 258* 374* 435*    BILITOT 1.1 1.4* 1.0 1.5* 1.8*  PROT 6.4* 5.7* 5.6* 5.2* 5.7*  ALBUMIN 3.5 3.2* 3.2* 3.0* 3.1*   Recent Labs  Lab 06/23/18 0119  LIPASE 37   Recent Labs  Lab 06/24/18 1225 06/26/18 0410  AMMONIA 22 38*   Coagulation Profile: Recent Labs  Lab 06/23/18 0432 06/24/18 0312 06/25/18 0335 06/27/18 0614  INR 1.34 1.37 1.35 1.53   Cardiac Enzymes: No results for input(s): CKTOTAL, CKMB, CKMBINDEX, TROPONINI in the last 168 hours. BNP (last 3 results) No results for input(s): PROBNP in the last 8760 hours. HbA1C: No results for input(s): HGBA1C in the last 72 hours. CBG: Recent Labs  Lab 06/28/18 1127 06/28/18 1634 06/28/18 2117 06/29/18 0613 06/29/18 0745  GLUCAP 103* 118* 100* 61* 72   Lipid Profile: No results for  input(s): CHOL, HDL, LDLCALC, TRIG, CHOLHDL, LDLDIRECT in the last 72 hours. Thyroid Function Tests: No results for input(s): TSH, T4TOTAL, FREET4, T3FREE, THYROIDAB in the last 72 hours. Anemia Panel: No results for input(s): VITAMINB12, FOLATE, FERRITIN, TIBC, IRON, RETICCTPCT in the last 72 hours. Urine analysis:    Component Value Date/Time   COLORURINE YELLOW 06/23/2018 [place] 06/23/2018 0434   APPEARANCEUR Cloudy 06/20/2013 1745   LABSPEC 1.014 06/23/2018 0434   LABSPEC 1.012 06/20/2013 1745   PHURINE 6.0 06/23/2018 0434   GLUCOSEU NEGATIVE 06/23/2018 0434   GLUCOSEU Negative 06/20/2013 1745   HGBUR NEGATIVE 06/23/2018 0434   BILIRUBINUR NEGATIVE 06/23/2018 0434   BILIRUBINUR n 05/17/2015 1425   BILIRUBINUR Negative 06/20/2013 1745   KETONESUR NEGATIVE 06/23/2018 0434   PROTEINUR NEGATIVE 06/23/2018 0434   UROBILINOGEN negative 05/17/2015 1425   UROBILINOGEN 0.2 06/29/2013 1530   NITRITE NEGATIVE 06/23/2018 0434   LEUKOCYTESUR NEGATIVE 06/23/2018 0434   LEUKOCYTESUR 3+ 06/20/2013 1745   Recent Results (from the past 240 hour(s))  MRSA PCR Screening     Status: None   Collection Time: 06/23/18 10:33 AM  Result Value  Ref Range Status   MRSA by PCR NEGATIVE NEGATIVE Final    Comment:        The GeneXpert MRSA Assay (FDA approved for NASAL specimens only), is one component of a comprehensive MRSA colonization surveillance program. It is not intended to diagnose MRSA infection nor to guide or monitor treatment for MRSA infections. Performed at [facility], [place] [address] [place], [place] [address]   Culture, blood (routine x 2)     Status: None (Preliminary result)   Collection Time: 06/24/18 12:56 PM  Result Value Ref Range Status   Specimen Description   Final    BLOOD LEFT ANTECUBITAL Performed at [facility] [address], [place], [place] [address]    Special Requests   Final    BOTTLES DRAWN AEROBIC ONLY Blood Culture adequate volume Performed at [place] [facility] [address], [place], [place] [address]    Culture   Final    NO GROWTH 3 DAYS Performed at [facility], [place] [address], [place], [place] [address]    Report Status PENDING  Incomplete  Culture, blood (routine x 2)     Status: None (Preliminary result)   Collection Time: 06/24/18  1:03 PM  Result Value Ref Range Status   Specimen Description   Final    BLOOD LEFT HAND Performed at [place] [address] [place], [place] [address]    Special Requests   Final    BOTTLES DRAWN AEROBIC ONLY Blood Culture results may not be optimal due to an inadequate volume of blood received in culture bottles Performed at [place] [address], [place], [place] [address]    Culture   Final    NO GROWTH 3 DAYS Performed at [facility], [place] [address], [place], [place] [address]    Report Status PENDING  Incomplete      Radiology Studies: No results found.  Scheduled Meds: . apixaban  5 mg Oral BID  . busPIRone  15 mg Oral Daily  . busPIRone  30 mg Oral QHS  . famotidine  20 mg Oral BID  . insulin aspart   0-9 Units Subcutaneous TID WC  . lacosamide  100 mg Oral BID  . lactulose  20 g Oral TID  . levETIRAcetam  1,000 mg Oral BID  . losartan  25 mg Oral Daily  . metoprolol tartrate  2.5 mg Intravenous Q8H  . venlafaxine XR  300 mg Oral Daily   Continuous Infusions:    LOS: 6 days   Time spent: 35 minutes.  Patrecia Pour, MD [facility] [email or web address] Password [email or web address] 06/29/2018, 8:02 AM

## 2018-06-29 NOTE — Progress Notes (Signed)
Pt transported on vent by RT to CT and back to room 0Z70 with no complications.

## 2018-06-29 NOTE — Progress Notes (Signed)
CRITICAL VALUE ALERT  Critical Value:  INR 8.74; Troponin 0.20; lactic acid 8.3  Date & Time Notied:  06/29/18; 1733 hrs  Provider Notified: Dr. [name]  Orders Received/Actions taken: Orders received: "repeat PT-INR in 6 hours; monitor for and call MD if S/S bleeding."

## 2018-06-29 NOTE — Consult Note (Signed)
Renal Service Consult Note                                              ------------------------------[facility] ----------------------------------   Brandi [name] 06/29/2018 Kathi Ludwig MD Requesting Physician:  Dr. Bonner Puna  Reason for Consult:  Hypernatremia 157 HPI: The patient is a 62 y.o. year-old female with a PMHx notable for HTN, HLD, seizure disorder, left breast CA s/p lumpectomy in 2009, GERD and MDD who presented to the [facility] ED on 07/09 w/ abdominal pain, nausea, vomiting, and weakness resulting in decreased PO intake. The patient was evaluated by her PCP prior to admission with elevated LFT's (AST302, ALT 611, AP 380) w/ follow up CT Abd demonstrating a prominent caudate lobe and surface irregularity of the liver with these findings confirmed via [place] in the ED. The patients ammonia increased to 38 from 22 from 07/12-/07/12. She went into new onset A-fib w/ RVR initially being treated with cardizem but transitioned to metoprolol by cardiology due to decreased LVEF of 30-35%. She demonstrated a microcytic anemia, ferritin of 9. She noted to be lethargic with acute encephalopathy w/ an EEG demonstrating mild generalized slowing which neurology did not feel to be related to her right frontal operculum stroke which was most likely the result of her A-fib as per neurology. 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 Patients transaminase continued to increased w/ AST/ALT 3942/1976, Alk Phos 404 and total bili 2.9. Patients I/O's w/ positive balance of ~4L since admission but without output since 7/11 recorded. She developed acute hypernatremia on 07/14 thought to be 2/2 insufficiently replaced volume losses's.   On 07/15 patient progressively worsened with concern for tardive dyskinesia which prompted discontinuation of latuda. The patients Cr increased from what appears to abe a baseline of 0.8 to 2.14, with an  increase in the BUN to 32 by 07/15 in conjunction with the sodium increase to 157. Ammonia elevated to 96 despite lactulose treatment. PCCM was consulted and the patient admitted for respiratory insufficiency.    ROS-unable to assess, patient was intubated.   Past Medical History  Past Medical History:  Diagnosis Date  . Allergic rhinitis   . Anemia   . Arthritis HX RIGHT ANKLE FX  . Cancer [facility])    breast, lumpectomy  . Depression    MAJOR  . Dyslipidemia   . GERD (gastroesophageal reflux disease)   . History of breast cancer DX 2010--  S/P LUMPECTOMY AND RADIATION -- NO RECURRENCE   ONCOLOGIST- DR Truddie Coco  . Hypertension   . Left flank pain   . Left ureteral calculus   . Migraines   . Schizophrenic disorder ([place])   . Tonic-clonic seizure disorder ([place]) X1  2010--  NO SEIZURE SINCE  . Type II or unspecified type diabetes mellitus without mention of complication, not stated as uncontrolled 03/16/2014   Past Surgical History  Past Surgical History:  Procedure Laterality Date  . BREAST SURGERY  01-04-2009  DR Margot Chimes   LEFT BREAST LUMPECTOMY W/ SLN DISSECTION  FOR CANCER  . CATARACT EXTRACTION, BILATERAL Bilateral   . CESAREAN SECTION  1998  . CHILD BIRTH     C-SECTION  . CYSTOSCOPY WITH RETROGRADE PYELOGRAM, URETEROSCOPY AND STENT PLACEMENT  11/10/2012   Procedure: CYSTOSCOPY WITH RETROGRADE PYELOGRAM, URETEROSCOPY AND STENT PLACEMENT;  Surgeon: Hanley Ben,  MD;  Location: [place];  Service: Urology;  Laterality: Left;  WITH DOUBLE J STENT  . HOLMIUM LASER APPLICATION  51/09/2110   Procedure: HOLMIUM LASER APPLICATION;  Surgeon: Hanley Ben, MD;  Location: [place];  Service: Urology;  Laterality: Left;  . LAPAROSCOPIC CHOLECYSTECTOMY  1990'S   Family History  Family History  Problem Relation Age of Onset  . Cancer Sister        breast  . Cancer Mother        breast  . Cancer Father        lung  . Mental illness Maternal Uncle    . Cancer Maternal Grandmother   . Stroke Maternal Grandmother   . Tuberculosis Maternal Grandfather   . Cancer Paternal Grandmother   . Seizures Neg Hx    Social History  reports that she has never smoked. She has never used smokeless tobacco. She reports that she does not drink alcohol or use drugs. Allergies  Allergies  Allergen Reactions  . Penicillins Rash    Has patient had a PCN reaction causing immediate rash, facial/tongue/throat swelling, SOB or lightheadedness with hypotension: No Has patient had a PCN reaction causing severe rash involving mucus membranes or skin necrosis: No Has patient had a PCN reaction that required hospitalization No Has patient had a PCN reaction occurring within the last 10 years: No If all of the above answers are "NO", then may proceed with Cephalosporin use.    Home medications Prior to Admission medications   Medication Sig Start Date End Date Taking? Authorizing Provider  aspirin EC 81 MG EC tablet Take 1 tablet (81 mg total) by mouth daily. 12/09/16  Yes Mikhail, Velta Addison, DO  atorvastatin (LIPITOR) 20 MG tablet Take 1 tablet (20 mg total) by mouth daily. 01/26/18  Yes Denita Lung, MD  busPIRone (BUSPAR) 30 MG tablet Take 15-30 mg by mouth See admin instructions. 15 mg every morning, 30 mg every evening 11/01/14  Yes [provider]  calcium-vitamin D (OSCAL WITH D) 250-125 MG-UNIT per tablet Take 2 tablets by mouth daily. 05/18/14  Yes Causey, Charlestine Massed, NP  dicyclomine (BENTYL) 20 MG tablet Take 1 tablet (20 mg total) by mouth every 8 (eight) hours as needed for spasms. 06/20/18  Yes Long, Wonda Olds, MD  LATUDA 120 MG TABS Take 120 mg by mouth daily.  10/17/15  Yes [provider]  levETIRAcetam (KEPPRA) 500 MG tablet TAKE 2 TABLETS (1,000 MG TOTAL) BY MOUTH 2 (TWO) TIMES DAILY. 08/13/17  Yes Millikan, Jinny Blossom, NP  lisinopril (PRINIVIL,ZESTRIL) 5 MG tablet TAKE 1 TABLET (5 MG TOTAL) BY MOUTH DAILY. 01/26/18  Yes Denita Lung, MD  LORazepam (ATIVAN) 0.5 MG tablet Take 0.5-1 mg by mouth 2 (two) times daily. 1 tablet every morning, and 2 tablets at bedtime as needed for anxiety   Yes [provider]  omeprazole (PRILOSEC) 20 MG capsule Take 1 capsule (20 mg total) by mouth daily. 06/22/18  Yes Denita Lung, MD  ondansetron (ZOFRAN ODT) 4 MG disintegrating tablet Take 1 tablet (4 mg total) by mouth every 8 (eight) hours as needed for nausea or vomiting. 06/20/18  Yes Long, Wonda Olds, MD  ondansetron (ZOFRAN) 4 MG tablet Take 1 tablet (4 mg total) by mouth every 8 (eight) hours as needed for nausea or vomiting. 06/22/18  Yes Denita Lung, MD  venlafaxine XR (EFFEXOR-XR) 150 MG 24 hr capsule Take 300 mg by mouth daily.  10/16/15  Yes  [provider]  VIMPAT 100 MG TABS TAKE 1 TABLET BY MOUTH TWICE A DAY 01/07/18  Yes Kathrynn Ducking, MD  [facility] LANCETS FINE MISC DX: E11.9  Patient is to test one time a day 03/11/17   Denita Lung, MD   Liver Function Tests Recent Labs  Lab 06/28/18 0443 06/29/18 0755 06/29/18 1111  AST 78* 3,365* 3,942*  ALT 88* 1,825* 1,976*  ALKPHOS 435* 409* 404*  BILITOT 1.8* 2.9* 2.9*  PROT 5.7* 5.4* 5.4*  ALBUMIN 3.1* 3.2* 3.3*   Recent Labs  Lab 06/23/18 0119  LIPASE 37   CBC Recent Labs  Lab 06/27/18 0614 06/28/18 0443 06/29/18 0755  WBC 8.8 9.7 31.9*  NEUTROABS 5.9 6.9 28.7*  HGB 9.1* 9.4* 8.9*  HCT 32.6* 35.1* 33.1*  MCV 67.4* 69.6* 70.9*  PLT 441* 347 270*   Basic Metabolic Panel Recent Labs  Lab 06/23/18 0119 06/24/18 0312 06/25/18 0335 06/27/18 0614 06/28/18 0443 06/29/18 0755 06/29/18 1111  NA 137 139 142 145 148* 157* 156*  K 3.2* 3.5 4.1 4.0 3.5 5.3* 5.3*  CL 98 107 110 114* 117* 122* 123*  CO2 '27 24 24 22 ' 16* 15* 12*  GLUCOSE 134* 130* 136* 117* 157* 66* 95  BUN '18 12 12 14 14 ' 28* 32*  CREATININE 0.87 0.75 0.83 0.91 1.02* 1.90* 2.14*  CALCIUM 9.0 8.3* 8.9 8.7* 8.9 9.7 9.5   Iron/TIBC/Ferritin/ %Sat    Component Value  Date/Time   IRON 14 (L) 06/25/2018 0335   TIBC 375 06/25/2018 0335   FERRITIN 9 (L) 06/25/2018 0335   IRONPCTSAT 4 (L) 06/25/2018 0335    Vitals:   06/29/18 0808 06/29/18 1400 06/29/18 1403 06/29/18 1407  BP: 105/88  (!) 53/39   Pulse: 100  (!) 223 (!) 225  Resp: (!) '27  18 18  ' Temp: 99.1 F (37.3 C)     TempSrc: Axillary     SpO2: 100%  98% 100%  Weight:      Height:  '5\' 2"'  (1.575 m)     Exam Gen Intubated, afebrile, nondiaphoretic  No rash, cyanosis or gangrene  Lungs clear bilat  RRR no MRG auscultated  Abd soft ntnd no mass or ascites +bs  MS no joint effusions or deformity noted on brief exam Ext nonedematous, no wounds or ulcers noted on brief exam Intubated, unresponsive, sedated.    Home meds: Current Meds  Medication Sig  . aspirin EC 81 MG EC tablet Take 1 tablet (81 mg total) by mouth daily.  Marland Kitchen atorvastatin (LIPITOR) 20 MG tablet Take 1 tablet (20 mg total) by mouth daily.  . busPIRone (BUSPAR) 30 MG tablet Take 15-30 mg by mouth See admin instructions. 15 mg every morning, 30 mg every evening  . calcium-vitamin D (OSCAL WITH D) 250-125 MG-UNIT per tablet Take 2 tablets by mouth daily.  Marland Kitchen dicyclomine (BENTYL) 20 MG tablet Take 1 tablet (20 mg total) by mouth every 8 (eight) hours as needed for spasms.  Marland Kitchen LATUDA 120 MG TABS Take 120 mg by mouth daily.   Marland Kitchen levETIRAcetam (KEPPRA) 500 MG tablet TAKE 2 TABLETS (1,000 MG TOTAL) BY MOUTH 2 (TWO) TIMES DAILY.  Marland Kitchen lisinopril (PRINIVIL,ZESTRIL) 5 MG tablet TAKE 1 TABLET (5 MG TOTAL) BY MOUTH DAILY.  Marland Kitchen LORazepam (ATIVAN) 0.5 MG tablet Take 0.5-1 mg by mouth 2 (two) times daily. 1 tablet every morning, and 2 tablets at bedtime as needed for anxiety  . omeprazole (PRILOSEC) 20 MG capsule Take 1 capsule (20  mg total) by mouth daily.  . ondansetron (ZOFRAN ODT) 4 MG disintegrating tablet Take 1 tablet (4 mg total) by mouth every 8 (eight) hours as needed for nausea or vomiting.  . ondansetron (ZOFRAN) 4 MG tablet Take 1 tablet  (4 mg total) by mouth every 8 (eight) hours as needed for nausea or vomiting.  . venlafaxine XR (EFFEXOR-XR) 150 MG 24 hr capsule Take 300 mg by mouth daily.   Marland Kitchen VIMPAT 100 MG TABS TAKE 1 TABLET BY MOUTH TWICE A DAY   Impression: Assessment: The patient is a 62 y.o. year-old female with a PMHx notable for HTN, HLD, seizure disorder, left breast CA s/p lumpectomy in 2009, GERD and MDD who presented to the [facility] ED on 07/09 w/ abdominal pain, nausea, vomiting, and weakness resulting in decreased PO intake. The patient became lethargic requiring intubation with acute encephalopathy most likely secondary to hyperammoniemia. She was noted to have marked hypernatremia to Na of 157. Given the presentation and lack of clear iatrogenic induction of the hypernatremia it appears that she has developed the abnormality secondary to increased losses.   Plan: Hypernatremia: Most likely secondary to increased water losses. Unable to assess urinary output as it appears that this has not been charted along with the GI output. As he is now in the ICU we should be better able to monitor the I/O's. The initial differential included concern for hepatorenal syndrome as well as diabetes insipidus, iatrogenic ie sodium bicarb to treat metabolic acidosis induced etiologies of the hypernatremia were considered. Agree that giving free water and repeating BMP q4 hours is recommended. Free water deficit ~2L.  -BMP q4 hours until satisfactory trend noted.  -Agree with free water, prefer IV 111m in a dedicated line to prevent interruption the flow -Okay w/ free water as per NG tube.  -Urine sodium ordered -Hold Keppra  Acute renal injury: Cr increased to 2.14 from a baseline of ~0.8 as per chart review. This is also consistent with free water losses. Hepatorenal syndrome of notable  -UA ordered -Continue D5W -BMP in am -Hold nephrotoxic agents  Acute encephalopathy: Unlikely to be related to the acute hypernatremia.  Continue treating hyperammonemia as per GI.    LKathi Ludwig MD Internal Medicine PGY-2  Please see Attending note/attestation for additional details and plan.

## 2018-06-29 NOTE — Progress Notes (Signed)
Pharmacy Antibiotic Note  Brandi [name] is a 62 y.o. female admitted on 06/23/2018 with sepsis.  Pharmacy has been consulted for vancomycin and Zosyn dosing.  Patient initially admitted for n/v with abdominal pain. Exam consistent with hepatic encephalopathy and patient has been on lactulose.  LFTs were trending down- dramatically elevated today along with acute SCr increase. With severe metabolic derangements, may need transfer to ICU.  SCr 1.02>1.9>2.1, eCrCl ~20-55mL/min  Plan: Zosyn 2.25g IV q8h with anticipation SCr will continue to worsen Vancomycin 1250mg  IV x1 as loading dose Given dramatic increase in SCr, will check vancomycin random level 24h after dose to assess maintenance dosing  Height: 5' (152.4 cm) Weight: 162 lb 11.2 oz (73.8 kg) IBW/kg (Calculated) : 45.5  Temp (24hrs), Avg:98.6 F (37 C), Min:97.4 F (36.3 C), Max:99.9 F (37.7 C)  Recent Labs  Lab 06/24/18 0312 06/25/18 0335 06/25/18 0539 06/27/18 0614 06/28/18 0443 06/29/18 0755  WBC 10.1  --  9.9 8.8 9.7 31.9*  CREATININE 0.75 0.83  --  0.91 1.02* 1.90*    Estimated Creatinine Clearance: 27.5 mL/min (A) (by C-G formula based on SCr of 1.9 mg/dL (H)).    Allergies  Allergen Reactions  . Penicillins Rash    Has patient had a PCN reaction causing immediate rash, facial/tongue/throat swelling, SOB or lightheadedness with hypotension: No Has patient had a PCN reaction causing severe rash involving mucus membranes or skin necrosis: No Has patient had a PCN reaction that required hospitalization No Has patient had a PCN reaction occurring within the last 10 years: No If all of the above answers are "NO", then may proceed with Cephalosporin use.     Antimicrobials this admission: Vacnomycin 7/15 >>  Zosyn 7/15 >>   Dose adjustments this admission: n/a  Microbiology results: 7/10 BCx: neg 7/19 MRSA PCR: neg  Thank you for allowing pharmacy to be a part of this patient's care.  Cardale Dorer D.  Jakyria Bleau, PharmD, BCPS Clinical Pharmacist [phone] Please check AMION for all [place] numbers 06/29/2018 12:26 PM

## 2018-06-29 NOTE — Consult Note (Signed)
            [facility] CM Primary Care Navigator  06/29/2018  Brandi [name] 14-Apr-1956 [id number]   Attempt to see patient at the bedside to identify possible discharge needs but she was transferred to ICU 50M 07 from 3W 26 due to respiratory insufficiency (intubated).  Will attempt to see patient at another time when she is out of ICU.     For additional questions please contact:  Edwena Felty A. Matilde Markie, BSN, RN-BC [facility] PRIMARY CARE Navigator Cell: [phone]

## 2018-06-29 NOTE — Consult Note (Addendum)
[place] Consult   Reason for Consult:  Altered Mental Status  Referring Physician: Attending physician Patient Identification: Brandi [name] MRN:  [id number] Principal Diagnosis: Atrial fibrillation with RVR [facility]) Diagnosis:   Patient Active Problem List   Diagnosis Date Noted  . Paroxysmal atrial fibrillation (HCC) [I48.0]   . Cerebral embolism with cerebral infarction [I63.40] 06/26/2018  . Atrial fibrillation with RVR ([place]) [I48.91] 06/23/2018  . Schizophrenia ([place]) [F20.9] 06/23/2018  . History of seizures [Z87.898] 06/23/2018  . Nausea and vomiting [R11.2] 06/23/2018  . SOB (shortness of breath) [R06.02] 06/12/2018  . Abnormal EKG [R94.31] 06/12/2018  . Elevated LFTs [R94.5] 06/12/2018  . Malaise [R53.81] 06/12/2018  . Hypertension associated with diabetes ([place]) [E11.59, I10] 12/05/2016  . Anemia [D64.9] 12/05/2016  . Convulsions/seizures ([place]) [R56.9] 11/02/2014  . Type 2 diabetes mellitus without complication, without long-term current use of insulin ([place]) [E11.9] 03/16/2014  . Leukocytosis [D72.829] 06/29/2013  . Thrombocytosis ([place]) [D47.3] 06/29/2013  . Major depression [F32.9] 09/10/2011  . Migraine headache [G43.909] 09/10/2011  . Obesity (BMI 30-39.9) [E66.9] 09/10/2011  . GERD (gastroesophageal reflux disease) [K21.9] 09/10/2011  . Allergic rhinitis due to pollen [J30.1] 09/10/2011  . History of breast cancer in female [Z85.3] 09/10/2011  . Hyperlipidemia associated with type 2 diabetes mellitus ([place]) [E11.69, E78.5] 09/10/2011    Total Time spent with patient: 15 minutes  Subjective:   Brandi [name] is a 62 y.o. female. Was unable to be assessed at this time. As reported by staff nurse Brandi [name]   this is not patient's "baseline". Reports patients CBG (capillary blood glucose level) was decreased this morning and RN was considering contacting  "rapid response"  Unable to assess at this time. Patient is not arousal able with verbal  commands at during this assessment.  HPI:Per assessment note by Brandi [name]:Brandi [name] is a 62 y.o. female with medical history significant for HTN, HLD, major depression, history of seizures, history of left breast cancer status post lumpectomy in 2009, not on treatment, GERD, recently seen at the emergency department with generalized weakness, and nausea but with negative work-up, which had included at the time a CT of the abdomen and pelvis, presenting with worsening times today, endorsing anorexia, intractable nausea, dark emesis, and shortness of breath especially over the last 2 days.  She denies any fever or chills, or sick contacts.  Patient had a remote endoscopy by Brandi [name], which per her report was negative, and she was to be seen by GI in the near future, Brandi [name] was planning a follow-up as an outpatient, especially since LFTs have been noted to be elevated for the last 2 weeks.  She denies colon or other GI cancers.  She denies any history of ulcers.  No recent long distance trips.  She denies any food poisoning.   She denies any abdominal pain or cramping or diarrhea.  She denies any focal weakness.  She denies any chest pain, chest wall pain or palpitations.  She never had a cardiac evaluation.  She denies any history of MI or cardiac catheterization.  She denies any lower extremity swelling or calf pain.  She takes an aspirin a day and other NSAIDs.  No changes in her medications, but has been taking electrolytes as outpatient.  She denies any headaches, vision changes, no confusion is reported.  No recent seizures. She has been somewhat dizzy due to dehydration according to her, but she denies any vertigo.  She denies a history of a stroke.  She  denies any tobacco, alcohol or recreational drug use.  She has not been compliant with her meds, last taken 1 week ago, due to current complaints    Past Psychiatric History:   Risk to Self: Is patient at risk for suicide?: No Risk to Others:    Prior Inpatient Therapy:   Prior Outpatient Therapy:    Past Medical History:  Past Medical History:  Diagnosis Date  . Allergic rhinitis   . Anemia   . Arthritis HX RIGHT ANKLE FX  . Cancer [facility])    breast, lumpectomy  . Depression    MAJOR  . Dyslipidemia   . GERD (gastroesophageal reflux disease)   . History of breast cancer DX 2010--  S/P LUMPECTOMY AND RADIATION -- NO RECURRENCE   ONCOLOGIST- Brandi [name]  . Hypertension   . Left flank pain   . Left ureteral calculus   . Migraines   . Schizophrenic disorder ([place])   . 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 Type II or unspecified type diabetes mellitus without mention of complication, not stated as uncontrolled 03/16/2014    Past Surgical History:  Procedure Laterality Date  . BREAST SURGERY  01-04-2009  Brandi Brandi [name]   LEFT BREAST LUMPECTOMY W/ SLN DISSECTION  FOR CANCER  . CATARACT EXTRACTION, BILATERAL Bilateral   . CESAREAN SECTION  1998  . CHILD BIRTH     C-SECTION  . CYSTOSCOPY WITH RETROGRADE PYELOGRAM, URETEROSCOPY AND STENT PLACEMENT  11/10/2012   Procedure: CYSTOSCOPY WITH RETROGRADE PYELOGRAM, URETEROSCOPY AND STENT PLACEMENT;  Surgeon: Brandi Ben, MD;  Location: [facility];  Service: Urology;  Laterality: Left;  WITH DOUBLE J STENT  . HOLMIUM LASER APPLICATION  95/62/1308   Procedure: HOLMIUM LASER APPLICATION;  Surgeon: Brandi Ben, MD;  Location: [facility];  Service: Urology;  Laterality: Left;  . LAPAROSCOPIC CHOLECYSTECTOMY  1990'S   Family History:  Family History  Problem Relation Age of Onset  . Cancer Sister        breast  . Cancer Mother        breast  . Cancer Father        lung  . Mental illness Maternal Uncle   . Cancer Maternal Grandmother   . Stroke Maternal Grandmother   . Tuberculosis Maternal Grandfather   . Cancer Paternal Grandmother   . Seizures Neg Hx    Family Psychiatric  History:  Social History:  Social  History   Substance and Sexual Activity  Alcohol Use No     Social History   Substance and Sexual Activity  Drug Use No    Social History   Socioeconomic History  . Marital status: Married    Spouse name: Not on file  . Number of children: Not on file  . Years of education: Not on file  . Highest education level: Not on file  Occupational History  . Not on file  Social Needs  . Financial resource strain: Not on file  . Food insecurity:    Worry: Not on file    Inability: Not on file  . Transportation needs:    Medical: Not on file    Non-medical: Not on file  Tobacco Use  . Smoking status: Never Smoker  . Smokeless tobacco: Never Used  Substance and Sexual Activity  . Alcohol use: No  . Drug use: No  . Sexual activity: Not Currently  Lifestyle  . Physical activity:    Days per week: Not  on file    Minutes per session: Not on file  . Stress: Not on file  Relationships  . Social connections:    Talks on phone: Not on file    Gets together: Not on file    Attends religious service: Not on file    Active member of club or organization: Not on file    Attends meetings of clubs or organizations: Not on file    Relationship status: Not on file  Other Topics Concern  . Not on file  Social History Narrative  . Not on file   Additional Social History:    Allergies:   Allergies  Allergen Reactions  . Penicillins Rash    Has patient had a PCN reaction causing immediate rash, facial/tongue/throat swelling, SOB or lightheadedness with hypotension: No Has patient had a PCN reaction causing severe rash involving mucus membranes or skin necrosis: No Has patient had a PCN reaction that required hospitalization No Has patient had a PCN reaction occurring within the last 10 years: No If all of the above answers are "NO", then may proceed with Cephalosporin use.     Labs:  Results for orders placed or performed during the hospital encounter of 06/23/18 (from the past  48 hour(s))  Glucose, capillary     Status: Abnormal   Collection Time: 06/27/18 11:21 AM  Result Value Ref Range   Glucose-Capillary 161 (H) 70 - 99 mg/dL   Comment 1 Notify RN   Glucose, capillary     Status: Abnormal   Collection Time: 06/27/18  4:50 PM  Result Value Ref Range   Glucose-Capillary 125 (H) 70 - 99 mg/dL  Glucose, capillary     Status: Abnormal   Collection Time: 06/27/18 10:37 PM  Result Value Ref Range   Glucose-Capillary 143 (H) 70 - 99 mg/dL  CBC with Differential/Platelet     Status: Abnormal   Collection Time: 06/28/18  4:43 AM  Result Value Ref Range   WBC 9.7 4.0 - 10.5 K/uL   RBC 5.04 3.87 - 5.11 MIL/uL   Hemoglobin 9.4 (L) 12.0 - 15.0 g/dL   HCT 35.1 (L) 36.0 - 46.0 %   MCV 69.6 (L) 78.0 - 100.0 fL   MCH 18.7 (L) 26.0 - 34.0 pg   MCHC 26.8 (L) 30.0 - 36.0 g/dL   RDW 21.2 (H) 11.5 - 15.5 %   Platelets 347 150 - 400 K/uL   Neutrophils Relative % 72 %   Lymphocytes Relative 15 %   Monocytes Relative 11 %   Eosinophils Relative 1 %   Basophils Relative 1 %   Neutro Abs 6.9 1.7 - 7.7 K/uL   Lymphs Abs 1.5 0.7 - 4.0 K/uL   Monocytes Absolute 1.1 (H) 0.1 - 1.0 K/uL   Eosinophils Absolute 0.1 0.0 - 0.7 K/uL   Basophils Absolute 0.1 0.0 - 0.1 K/uL   RBC Morphology POLYCHROMASIA PRESENT     Comment: TARGET CELLS ELLIPTOCYTES MIXED RBC POPULATION Performed at [facility], [address], [place], [place] [address]   Comprehensive metabolic panel     Status: Abnormal   Collection Time: 06/28/18  4:43 AM  Result Value Ref Range   Sodium 148 (H) 135 - 145 mmol/L   Potassium 3.5 3.5 - 5.1 mmol/L   Chloride 117 (H) 98 - 111 mmol/L    Comment: Please note change in reference range.   CO2 16 (L) 22 - 32 mmol/L   Glucose, Bld 157 (H) 70 - 99 mg/dL  Comment: Please note change in reference range.   BUN 14 8 - 23 mg/dL    Comment: Please note change in reference range.   Creatinine, Ser 1.02 (H) 0.44 - 1.00 mg/dL   Calcium 8.9 8.9 - 10.3 mg/dL    Total Protein 5.7 (L) 6.5 - 8.1 g/dL   Albumin 3.1 (L) 3.5 - 5.0 g/dL   AST 78 (H) 15 - 41 U/L   ALT 88 (H) 0 - 44 U/L    Comment: Please note change in reference range.   Alkaline Phosphatase 435 (H) 38 - 126 U/L   Total Bilirubin 1.8 (H) 0.3 - 1.2 mg/dL   GFR calc non Af Amer 58 (L) >60 mL/min   GFR calc Af Amer >60 >60 mL/min    Comment: (NOTE) The eGFR has been calculated using the CKD EPI equation. This calculation has not been validated in all clinical situations. eGFR's persistently <60 mL/min signify possible Chronic Kidney Disease.    Anion gap 15 5 - 15    Comment: Performed at [facility] [address] [place], [place] [address]  Heparin level (unfractionated)     Status: Abnormal   Collection Time: 06/28/18  4:43 AM  Result Value Ref Range   Heparin Unfractionated 0.75 (H) 0.30 - 0.70 IU/mL    Comment: (NOTE) If heparin results are below expected values, and patient dosage has  been confirmed, suggest follow up testing of antithrombin III levels. Performed at [facility], [place] [address], [place], [place] [address]   Glucose, capillary     Status: Abnormal   Collection Time: 06/28/18  6:07 AM  Result Value Ref Range   Glucose-Capillary 137 (H) 70 - 99 mg/dL   Comment 1 Notify RN    Comment 2 Document in Chart   Heparin level (unfractionated)     Status: Abnormal   Collection Time: 06/28/18  6:20 AM  Result Value Ref Range   Heparin Unfractionated 0.71 (H) 0.30 - 0.70 IU/mL    Comment: (NOTE) If heparin results are below expected values, and patient dosage has  been confirmed, suggest follow up testing of antithrombin III levels. Performed at [facility], [place] [address], [place], [place] [address]   Glucose, capillary     Status: Abnormal   Collection Time: 06/28/18 11:27 AM  Result Value Ref Range   Glucose-Capillary 103 (H) 70 - 99 mg/dL  Glucose, capillary     Status: Abnormal   Collection Time: 06/28/18  4:34 PM  Result Value Ref  Range   Glucose-Capillary 118 (H) 70 - 99 mg/dL  Glucose, capillary     Status: Abnormal   Collection Time: 06/28/18  9:17 PM  Result Value Ref Range   Glucose-Capillary 100 (H) 70 - 99 mg/dL  Glucose, capillary     Status: Abnormal   Collection Time: 06/29/18  6:13 AM  Result Value Ref Range   Glucose-Capillary 61 (L) 70 - 99 mg/dL  Glucose, capillary     Status: None   Collection Time: 06/29/18  7:45 AM  Result Value Ref Range   Glucose-Capillary 72 70 - 99 mg/dL  CBC with Differential/Platelet     Status: Abnormal   Collection Time: 06/29/18  7:55 AM  Result Value Ref Range   WBC 31.9 (H) 4.0 - 10.5 K/uL   RBC 4.67 3.87 - 5.11 MIL/uL   Hemoglobin 8.9 (L) 12.0 - 15.0 g/dL   HCT 33.1 (L) 36.0 - 46.0 %   MCV  70.9 (L) 78.0 - 100.0 fL   MCH 19.1 (L) 26.0 - 34.0 pg   MCHC 26.9 (L) 30.0 - 36.0 g/dL   RDW 21.5 (H) 11.5 - 15.5 %   Platelets 415 (H) 150 - 400 K/uL   Neutrophils Relative % 90 %   Lymphocytes Relative 3 %   Monocytes Relative 7 %   Eosinophils Relative 0 %   Basophils Relative 0 %   Neutro Abs 28.7 (H) 1.7 - 7.7 K/uL   Lymphs Abs 1.0 0.7 - 4.0 K/uL   Monocytes Absolute 2.2 (H) 0.1 - 1.0 K/uL   Eosinophils Absolute 0.0 0.0 - 0.7 K/uL   Basophils Absolute 0.0 0.0 - 0.1 K/uL   RBC Morphology TARGET CELLS     Comment: ELLIPTOCYTES BURR CELLS Spherocytes present Acanthocytes present    WBC Morphology INCREASED BANDS (>20% BANDS)     Comment: Performed at [facility], [place] [address] [place], [place] [address]  Comprehensive metabolic panel     Status: Abnormal   Collection Time: 06/29/18  7:55 AM  Result Value Ref Range   Sodium 157 (H) 135 - 145 mmol/L   Potassium 5.3 (H) 3.5 - 5.1 mmol/L   Chloride 122 (H) 98 - 111 mmol/L    Comment: Please note change in reference range.   CO2 15 (L) 22 - 32 mmol/L   Glucose, Bld 66 (L) 70 - 99 mg/dL    Comment: Please note change in reference range.   BUN 28 (H) 8 - 23 mg/dL    Comment: Please note change in  reference range.   Creatinine, Ser 1.90 (H) 0.44 - 1.00 mg/dL   Calcium 9.7 8.9 - 10.3 mg/dL   Total Protein 5.4 (L) 6.5 - 8.1 g/dL   Albumin 3.2 (L) 3.5 - 5.0 g/dL   AST 3,365 (H) 15 - 41 U/L    Comment: RESULTS CONFIRMED BY MANUAL DILUTION   ALT 1,825 (H) 0 - 44 U/L    Comment: Please note change in reference range.   Alkaline Phosphatase 409 (H) 38 - 126 U/L   Total Bilirubin 2.9 (H) 0.3 - 1.2 mg/dL   GFR calc non Af Amer 27 (L) >60 mL/min   GFR calc Af Amer 32 (L) >60 mL/min    Comment: (NOTE) The eGFR has been calculated using the CKD EPI equation. This calculation has not been validated in all clinical situations. eGFR's persistently <60 mL/min signify possible Chronic Kidney Disease.    Anion gap 20 (H) 5 - 15    Comment: Performed at [facility], [place] [address] [place], [place] [address]  Glucose, capillary     Status: Abnormal   Collection Time: 06/29/18 10:20 AM  Result Value Ref Range   Glucose-Capillary 109 (H) 70 - 99 mg/dL    Current Facility-Administered Medications  Medication Dose Route Frequency Provider Last Rate Last Dose  . apixaban (ELIQUIS) tablet 5 mg  5 mg Oral BID Nimer, Ignatius Specking, RPH   5 mg at 06/28/18 2219  . busPIRone (BUSPAR) tablet 15 mg  15 mg Oral Daily Rondel Jumbo, PA-C   15 mg at 06/28/18 0906  . busPIRone (BUSPAR) tablet 30 mg  30 mg Oral QHS Rise Patience, MD   30 mg at 06/28/18 2219  . famotidine (PEPCID) tablet 20 mg  20 mg Oral BID Patrecia Pour, MD   20 mg at 06/28/18 2201  . gi cocktail (Maalox,Lidocaine,Donnatal)  30 mL Oral TID PRN Wertman,  Coralee Pesa, PA-C      . HYDROcodone-acetaminophen (NORCO/VICODIN) 5-325 MG per tablet 1 tablet  1 tablet Oral Q8H PRN Alma Friendly, MD   1 tablet at 06/28/18 1748  . lacosamide (VIMPAT) tablet 100 mg  100 mg Oral BID Patrecia Pour, MD   100 mg at 06/28/18 2221  . lactulose (CHRONULAC) 10 GM/15ML solution 20 g  20 g Oral TID Patrecia Pour, MD   20 g at 06/28/18 2218  .  levETIRAcetam (KEPPRA) tablet 1,000 mg  1,000 mg Oral BID Patrecia Pour, MD   1,000 mg at 06/28/18 2221  . LORazepam (ATIVAN) tablet 0.5 mg  0.5 mg Oral BID PRN Patrecia Pour, MD   0.5 mg at 06/29/18 0251  . losartan (COZAAR) tablet 25 mg  25 mg Oral Daily Nahser, Wonda Cheng, MD   25 mg at 06/28/18 1419  . metoprolol tartrate (LOPRESSOR) tablet 25 mg  25 mg Oral BID Patrecia Pour, MD      . ondansetron [facility]) tablet 4 mg  4 mg Oral Q6H PRN Rondel Jumbo, PA-C       Or  . ondansetron (ZOFRAN) injection 4 mg  4 mg Intravenous Q6H PRN Rondel Jumbo, PA-C      . senna-docusate (Senokot-S) tablet 1 tablet  1 tablet Oral QHS PRN Rondel Jumbo, PA-C      . venlafaxine XR (EFFEXOR-XR) 24 hr capsule 300 mg  300 mg Oral Daily Rondel Jumbo, PA-C   300 mg at 06/28/18 0902    Musculoskeletal: Strength & Muscle Tone: UTA Gait & Station: UTA Patient leans: N/A  Psychiatric Specialty Exam: Physical Exam  Nursing note and vitals reviewed.   Review of Systems  All other systems reviewed and are negative.   Blood pressure 105/88, pulse 100, temperature 99.1 F (37.3 C), temperature source Axillary, resp. rate (!) 27, height 5' (1.524 m), weight 73.8 kg (162 lb 11.2 oz), SpO2 100 %.Body mass index is 31.78 kg/m.  General Appearance: Lethargic and not responsive to verbal commands   Eye Contact:  None  Speech:  Negative  Volume:  UTA  Mood:  NA  Affect:  NA  Thought Process:  NA  Orientation:  NA  Thought Content:  NA  Suicidal Thoughts:  N/A  Homicidal Thoughts:  N/A  Memory:  N/A  Judgement:  NA  Insight:  NA  Psychomotor Activity:  NA  Concentration:  Concentration: NA  Recall:  NA  Fund of Knowledge:  NA  Language:  NA  Akathisia:  NA  Handed:    AIMS (if indicated):     Assets:  Others:  N/A  ADL's:  Impaired  Cognition:  Impaired,  Severe  Sleep:        Treatment Plan Summary: Plan Reinitiate consult when patient is coherent and/or family at bedside  Disposition:  Reassessment needed after medically cleared   Derrill Center, NP 06/29/2018 10:29 AM

## 2018-06-29 NOTE — Progress Notes (Signed)
Nurse unable to complete assessment. Patient has involuntary movement including the arms ,legs, mouth and tongue.  She will open her eyes to sternal rubs. She is not follow commands. MD is aware. Nurse will continue to monitor.

## 2018-06-29 NOTE — Progress Notes (Signed)
Progress Note  Patient Name: Brandi [name] Date of Encounter: 06/29/2018  Primary Cardiologist: Mertie Moores, MD   Subjective   Pt in restraints   Agitated attimes   Does not respond much to sternal pressure    Inpatient Medications    Scheduled Meds: . apixaban  5 mg Oral BID  . busPIRone  15 mg Oral Daily  . busPIRone  30 mg Oral QHS  . famotidine  20 mg Oral BID  . lacosamide  100 mg Oral BID  . lactulose  300 mL Rectal BID  . venlafaxine XR  300 mg Oral Daily   Continuous Infusions: . dextrose 100 mL/hr at 06/29/18 1132   PRN Meds: gi cocktail, HYDROcodone-acetaminophen, LORazepam, ondansetron **OR** ondansetron (ZOFRAN) IV   Vital Signs    Vitals:   06/29/18 0002 06/29/18 0409 06/29/18 0500 06/29/18 0808  BP: 107/63 107/60  105/88  Pulse:    100  Resp:    (!) 27  Temp: 98.2 F (36.8 C) 98.6 F (37 C)  99.1 F (37.3 C)  TempSrc: Oral Oral  Axillary  SpO2:    100%  Weight:   73.8 kg (162 lb 11.2 oz)   Height:        Intake/Output Summary (Last 24 hours) at 06/29/2018 1138 Last data filed at 06/29/2018 0800 Gross per 24 hour  Intake 120 ml  Output 1 ml  Net 119 ml   Filed Weights   06/27/18 0500 06/28/18 0500 06/29/18 0500  Weight: 74.9 kg (165 lb 2 oz) 73.2 kg (161 lb 6 oz) 73.8 kg (162 lb 11.2 oz)    Telemetry    ST  110s to 130s   - Personally Reviewed  ECG      Physical Exam   GEN:  Sl agitated attimes  .   Neck: No JVD Cardiac: RRR, no murmurs, rubs, or gallops.  Respiratory: Clear to auscultation bilaterally.anteriorly GI: Soft, nontender, non-distended  MS: No edema; No deformity. Neuro:  Pt does not respond to name or sternal rub    Moving extremities   Moving tongue       Labs    Chemistry Recent Labs  Lab 06/27/18 0614 06/28/18 0443 06/29/18 0755  NA 145 148* 157*  K 4.0 3.5 5.3*  CL 114* 117* 122*  CO2 22 16* 15*  GLUCOSE 117* 157* 66*  BUN 14 14 28*  CREATININE 0.91 1.02* 1.90*  CALCIUM 8.7* 8.9 9.7  PROT  5.2* 5.7* 5.4*  ALBUMIN 3.0* 3.1* 3.2*  AST 56* 78* 3,365*  ALT 75* 88* 1,825*  ALKPHOS 374* 435* 409*  BILITOT 1.5* 1.8* 2.9*  GFRNONAA >60 58* 27*  GFRAA >60 >60 32*  ANIONGAP 9 15 20*     Hematology Recent Labs  Lab 06/27/18 0614 06/28/18 0443 06/29/18 0755  WBC 8.8 9.7 31.9*  RBC 4.84 5.04 4.67  HGB 9.1* 9.4* 8.9*  HCT 32.6* 35.1* 33.1*  MCV 67.4* 69.6* 70.9*  MCH 18.8* 18.7* 19.1*  MCHC 27.9* 26.8* 26.9*  RDW 20.6* 21.2* 21.5*  PLT 441* 347 415*    Cardiac EnzymesNo results for input(s): TROPONINI in the last 168 hours.  Recent Labs  Lab 06/23/18 0437  TROPIPOC 0.02     BNP Recent Labs  Lab 06/23/18 0119 06/23/18 0929  BNP 689.0* 607.3*     DDimer No results for input(s): DDIMER in the last 168 hours.   Radiology    No results found.  Cardiac Studies    Patient Profile  62 y.o. female with newly dianosed CHF   LVEF 30 to 35%   Also recent CVA and atrial fibrillation    Assessment & Plan    1   Atrial fibrillation.   Telemetry is difficult    Regular   A little fast 110s to 130s    Probable ST   EKG with ST   Currently on Eliquis  2.   Systolic CHF   New diagnosis  Pt dos not appear volume overloaded   With Na 157 intravascularly dry   Medicine service following  3   GI   Marked bump in LFTs today    BP has been OK   ? If she had embolic event or ischemic event to liver    Will review with pharmacy drug effects  4   Neuro   Mental status is down   Does not respond to my sternal rub though did respond to others   IM addressing   Na 157 and LFTs markedly increased   5  F/E/N  Marked change in labs   Repeat CMET to confirm   Medicine service is aware.     For questions or updates, please contact [facility] Please consult [email or web address] for contact info under Cardiology/STEMI.      Signed, Dorris Carnes, MD  06/29/2018, 11:38 AM

## 2018-06-29 NOTE — Procedures (Signed)
OGT Placement By MD  During intubation, UGI bleed noted.  OGT placed under direct laryngoscopy and verified by auscultation.  Rush Farmer, M.D. [facility] Pulmonary/Critical Care Medicine. Pager: [phone]. After hours pager: [phone]

## 2018-06-29 NOTE — Procedures (Signed)
Central Venous Catheter Insertion Procedure Note Brandi [name] [id number] December 09, 1956  Procedure: Insertion of Central Venous Catheter Indications: Assessment of intravascular volume, Drug and/or fluid administration and Frequent blood sampling  Procedure Details Consent: Risks of procedure as well as the alternatives and risks of each were explained to the (patient/caregiver).  Consent for procedure obtained. Time Out: Verified patient identification, verified procedure, site/side was marked, verified correct patient position, special equipment/implants available, medications/allergies/relevent history reviewed, required imaging and test results available.  Performed  Maximum sterile technique was used including antiseptics, cap, gloves, gown, hand hygiene, mask and sheet. Skin prep: Chlorhexidine; local anesthetic administered A antimicrobial bonded/coated triple lumen catheter was placed in the left internal jugular vein using the Seldinger technique.  Evaluation Blood flow good Complications: No apparent complications Patient did tolerate procedure well. Chest X-ray ordered to verify placement.  CXR: pending.  U/S used in placement.  YACOUB,WESAM 06/29/2018, 3:11 PM

## 2018-06-29 NOTE — Progress Notes (Signed)
PT Cancellation Note  Patient Details Name: Brandi [name] MRN: [id number] DOB: 1956-03-31   Cancelled Treatment:    Reason Eval/Treat Not Completed: Fatigue/lethargy limiting ability to participate Patient with limited level of arousal. Does not open eyes to name. Seemingly restless in bed. Will re-attempt as time allows.   Lanney Gins, PT, DPT 06/29/18 9:03 AM Pager: [phone]

## 2018-06-29 NOTE — Procedures (Signed)
Arterial Catheter Insertion Procedure Note WAYLYNN BENEFIEL [id number] 10-05-1956  Procedure: Insertion of Arterial Catheter  Indications: Blood pressure monitoring and Frequent blood sampling  Procedure Details Consent: Risks of procedure as well as the alternatives and risks of each were explained to the (patient/caregiver).  Consent for procedure obtained. Time Out: Verified patient identification, verified procedure, site/side was marked, verified correct patient position, special equipment/implants available, medications/allergies/relevent history reviewed, required imaging and test results available.  Performed  Maximum sterile technique was used including antiseptics, cap, gloves, gown, hand hygiene, mask and sheet. Skin prep: Chlorhexidine; local anesthetic administered 20 gauge catheter was inserted into right femoral artery using the Seldinger technique.  Evaluation Blood flow good; BP tracing good. Complications: No apparent complications.   [place] Macarena Langseth, DO 06/29/2018

## 2018-06-29 NOTE — Procedures (Signed)
Intubation Procedure Note Brandi [name] [id number] February 26, 1956  Procedure: Intubation Indications: Respiratory insufficiency  Procedure Details Consent: Unable to obtain consent because of emergent medical necessity. Time Out: Verified patient identification, verified procedure, site/side was marked, verified correct patient position, special equipment/implants available, medications/allergies/relevent history reviewed, required imaging and test results available.  Performed  Maximum sterile technique was used including gloves, hand hygiene and mask.  MAC  Evaluation Hemodynamic Status: BP stable throughout; O2 sats: stable throughout Patient's Current Condition: stable Complications: No apparent complications Patient did tolerate procedure well. Chest X-ray ordered to verify placement.  CXR: pending.  Brandi [name] 06/29/2018

## 2018-06-30 ENCOUNTER — Inpatient Hospital Stay (HOSPITAL_COMMUNITY): Payer: Medicare Other

## 2018-06-30 LAB — COMPREHENSIVE METABOLIC PANEL
ALBUMIN: 2.7 g/dL — AB (ref 3.5–5.0)
ALK PHOS: 318 U/L — AB (ref 38–126)
ALT: 2361 U/L — ABNORMAL HIGH (ref 0–44)
ANION GAP: 9 (ref 5–15)
AST: 3278 U/L — ABNORMAL HIGH (ref 15–41)
BILIRUBIN TOTAL: 2.8 mg/dL — AB (ref 0.3–1.2)
BUN: 39 mg/dL — ABNORMAL HIGH (ref 8–23)
CALCIUM: 8.6 mg/dL — AB (ref 8.9–10.3)
CO2: 23 mmol/L (ref 22–32)
Chloride: 119 mmol/L — ABNORMAL HIGH (ref 98–111)
Creatinine, Ser: 1.71 mg/dL — ABNORMAL HIGH (ref 0.44–1.00)
GFR calc Af Amer: 36 mL/min — ABNORMAL LOW (ref >60)
GFR calc non Af Amer: 31 mL/min — ABNORMAL LOW (ref >60)
GLUCOSE: 154 mg/dL — AB (ref 70–99)
Potassium: 2.9 mmol/L — ABNORMAL LOW (ref 3.5–5.1)
Sodium: 151 mmol/L — ABNORMAL HIGH (ref 135–145)
Total Protein: 4.5 g/dL — ABNORMAL LOW (ref 6.5–8.1)

## 2018-06-30 LAB — PROTIME-INR
INR: 3.37
PROTHROMBIN TIME: 33.9 s — AB (ref 11.4–15.2)

## 2018-06-30 LAB — CBC WITH DIFFERENTIAL/PLATELET
BASOS ABS: 0 10*3/uL (ref 0.0–0.1)
BASOS PCT: 0 %
EOS ABS: 0 10*3/uL (ref 0.0–0.7)
Eosinophils Relative: 0 %
HCT: 28.3 % — ABNORMAL LOW (ref 36.0–46.0)
Hemoglobin: 7.8 g/dL — ABNORMAL LOW (ref 12.0–15.0)
LYMPHS PCT: 5 %
Lymphs Abs: 1.2 10*3/uL (ref 0.7–4.0)
MCH: 18.7 pg — AB (ref 26.0–34.0)
MCHC: 27.6 g/dL — ABNORMAL LOW (ref 30.0–36.0)
MCV: 67.7 fL — ABNORMAL LOW (ref 78.0–100.0)
MONO ABS: 1.2 10*3/uL — AB (ref 0.1–1.0)
Monocytes Relative: 5 %
NEUTROS PCT: 90 %
Neutro Abs: 21.2 10*3/uL — ABNORMAL HIGH (ref 1.7–7.7)
PLATELETS: 271 10*3/uL (ref 150–400)
RBC: 4.18 MIL/uL (ref 3.87–5.11)
RDW: 20.6 % — ABNORMAL HIGH (ref 11.5–15.5)
WBC: 23.6 10*3/uL — AB (ref 4.0–10.5)

## 2018-06-30 LAB — URINALYSIS, ROUTINE W REFLEX MICROSCOPIC
BILIRUBIN URINE: NEGATIVE
Glucose, UA: NEGATIVE mg/dL
KETONES UR: NEGATIVE mg/dL
Nitrite: NEGATIVE
PROTEIN: NEGATIVE mg/dL
Specific Gravity, Urine: 1.018 (ref 1.005–1.030)
pH: 5 (ref 5.0–8.0)

## 2018-06-30 LAB — COOXEMETRY PANEL
Carboxyhemoglobin: 1.4 % (ref 0.5–1.5)
METHEMOGLOBIN: 1.6 % — AB (ref 0.0–1.5)
O2 Saturation: 55.4 %
TOTAL HEMOGLOBIN: 9.8 g/dL — AB (ref 12.0–16.0)

## 2018-06-30 LAB — BASIC METABOLIC PANEL
Anion gap: 7 (ref 5–15)
BUN: 28 mg/dL — ABNORMAL HIGH (ref 8–23)
CALCIUM: 8 mg/dL — AB (ref 8.9–10.3)
CHLORIDE: 114 mmol/L — AB (ref 98–111)
CO2: 23 mmol/L (ref 22–32)
Creatinine, Ser: 1.07 mg/dL — ABNORMAL HIGH (ref 0.44–1.00)
GFR, EST NON AFRICAN AMERICAN: 54 mL/min — AB (ref >60)
GLUCOSE: 168 mg/dL — AB (ref 70–99)
Potassium: 2.5 mmol/L — CL (ref 3.5–5.1)
Sodium: 144 mmol/L (ref 135–145)

## 2018-06-30 LAB — BLOOD GAS, ARTERIAL
Acid-base deficit: 0.1 mmol/L (ref 0.0–2.0)
BICARBONATE: 23.1 mmol/L (ref 20.0–28.0)
Drawn by: 347191
FIO2: 40
LHR: 18 {breaths}/min
O2 SAT: 97.8 %
PEEP: 5 cmH2O
PH ART: 7.471 — AB (ref 7.350–7.450)
Patient temperature: 99.3
VT: 400 mL
pCO2 arterial: 32.2 mmHg (ref 32.0–48.0)
pO2, Arterial: 98.1 mmHg (ref 83.0–108.0)

## 2018-06-30 LAB — TROPONIN I: TROPONIN I: 0.26 ng/mL — AB (ref <0.03)

## 2018-06-30 LAB — HEMOGLOBIN AND HEMATOCRIT, BLOOD
HCT: 27.4 % — ABNORMAL LOW (ref 36.0–46.0)
HCT: 28.4 % — ABNORMAL LOW (ref 36.0–46.0)
HEMATOCRIT: 30.5 % — AB (ref 36.0–46.0)
HEMOGLOBIN: 8.4 g/dL — AB (ref 12.0–15.0)
Hemoglobin: 7.5 g/dL — ABNORMAL LOW (ref 12.0–15.0)
Hemoglobin: 8.1 g/dL — ABNORMAL LOW (ref 12.0–15.0)

## 2018-06-30 LAB — VANCOMYCIN, RANDOM: VANCOMYCIN RM: 6

## 2018-06-30 LAB — GLUCOSE, CAPILLARY
GLUCOSE-CAPILLARY: 139 mg/dL — AB (ref 70–99)
GLUCOSE-CAPILLARY: 141 mg/dL — AB (ref 70–99)
GLUCOSE-CAPILLARY: 150 mg/dL — AB (ref 70–99)
Glucose-Capillary: 159 mg/dL — ABNORMAL HIGH (ref 70–99)
Glucose-Capillary: 176 mg/dL — ABNORMAL HIGH (ref 70–99)
Glucose-Capillary: 182 mg/dL — ABNORMAL HIGH (ref 70–99)
Glucose-Capillary: 219 mg/dL — ABNORMAL HIGH (ref 70–99)

## 2018-06-30 LAB — AMMONIA: Ammonia: 62 umol/L — ABNORMAL HIGH (ref 9–35)

## 2018-06-30 LAB — ABO/RH: ABO/RH(D): O POS

## 2018-06-30 LAB — TYPE AND SCREEN
ABO/RH(D): O POS
Antibody Screen: NEGATIVE

## 2018-06-30 LAB — SODIUM, URINE, RANDOM: Sodium, Ur: 34 mmol/L

## 2018-06-30 LAB — PROCALCITONIN: PROCALCITONIN: 0.31 ng/mL

## 2018-06-30 MED ORDER — ALTEPLASE 2 MG IJ SOLR
2.0000 mg | Freq: Once | INTRAMUSCULAR | Status: AC
  Administered 2018-06-30: 2 mg

## 2018-06-30 MED ORDER — METOPROLOL TARTRATE 5 MG/5ML IV SOLN
2.5000 mg | Freq: Four times a day (QID) | INTRAVENOUS | Status: DC
  Administered 2018-06-30 (×2): 2.5 mg via INTRAVENOUS
  Filled 2018-06-30 (×2): qty 5

## 2018-06-30 MED ORDER — POTASSIUM CHLORIDE 10 MEQ/50ML IV SOLN
10.0000 meq | INTRAVENOUS | Status: AC
  Administered 2018-06-30 – 2018-07-01 (×6): 10 meq via INTRAVENOUS
  Filled 2018-06-30 (×6): qty 50

## 2018-06-30 MED ORDER — SODIUM CHLORIDE 0.9% FLUSH
10.0000 mL | INTRAVENOUS | Status: DC | PRN
  Administered 2018-07-09: 10 mL
  Filled 2018-06-30: qty 40

## 2018-06-30 MED ORDER — VITAMIN K1 10 MG/ML IJ SOLN
5.0000 mg | Freq: Once | INTRAVENOUS | Status: AC
  Administered 2018-06-30: 5 mg via INTRAVENOUS
  Filled 2018-06-30: qty 0.5

## 2018-06-30 MED ORDER — DEXTROSE 5 % IV SOLN
INTRAVENOUS | Status: AC
  Administered 2018-06-30 – 2018-07-01 (×4): via INTRAVENOUS

## 2018-06-30 MED ORDER — PIPERACILLIN-TAZOBACTAM 3.375 G IVPB
3.3750 g | Freq: Three times a day (TID) | INTRAVENOUS | Status: DC
  Administered 2018-06-30 – 2018-07-06 (×17): 3.375 g via INTRAVENOUS
  Filled 2018-06-30 (×18): qty 50

## 2018-06-30 MED ORDER — POTASSIUM CHLORIDE 10 MEQ/50ML IV SOLN
10.0000 meq | INTRAVENOUS | Status: AC
  Administered 2018-06-30 (×4): 10 meq via INTRAVENOUS
  Filled 2018-06-30 (×4): qty 50

## 2018-06-30 MED ORDER — CHLORHEXIDINE GLUCONATE CLOTH 2 % EX PADS
6.0000 | MEDICATED_PAD | Freq: Every day | CUTANEOUS | Status: DC
  Administered 2018-07-01 – 2018-07-11 (×11): 6 via TOPICAL

## 2018-06-30 MED ORDER — VANCOMYCIN HCL IN DEXTROSE 1-5 GM/200ML-% IV SOLN
1000.0000 mg | INTRAVENOUS | Status: DC
  Administered 2018-06-30 – 2018-07-03 (×4): 1000 mg via INTRAVENOUS
  Filled 2018-06-30 (×4): qty 200

## 2018-06-30 MED ORDER — POTASSIUM CHLORIDE 20 MEQ PO PACK
40.0000 meq | PACK | Freq: Once | ORAL | Status: AC
  Administered 2018-06-30: 40 meq via ORAL
  Filled 2018-06-30: qty 2

## 2018-06-30 NOTE — Progress Notes (Signed)
Progress Note  Patient Name: MOSELLA KASA Date of Encounter: 06/30/2018  Primary Cardiologist: Mertie Moores, MD   Subjective   Intubated   Sedated    Inpatient Medications    Scheduled Meds: . chlorhexidine gluconate (MEDLINE KIT)  15 mL Mouth Rinse BID  . Chlorhexidine Gluconate Cloth  6 each Topical Daily  . feeding supplement (VITAL HIGH PROTEIN)  1,000 mL Per Tube Q24H  . fentaNYL (SUBLIMAZE) injection  100 mcg Intravenous Once  . free water  200 mL Per Tube Q8H  . insulin aspart  0-9 Units Subcutaneous Q4H  . lacosamide  100 mg Oral BID  . lactulose  30 g Per Tube BID  . mouth rinse  15 mL Mouth Rinse 10 times per day  . vancomycin variable dose per unstable renal function (pharmacist dosing)   Does not apply See admin instructions   Continuous Infusions: . dextrose 100 mL/hr at 06/30/18 1845  . norepinephrine (LEVOPHED) Adult infusion Stopped (06/29/18 1618)  . pantoprozole (PROTONIX) infusion 8 mg/hr (06/30/18 1335)  . piperacillin-tazobactam (ZOSYN)  IV 2.25 g (06/30/18 1307)   PRN Meds: Vital Signs    Vitals:   06/30/18 1700 06/30/18 1900 06/30/18 1915 06/30/18 1930  BP:      Pulse:  (!) 59 (!) 133 (!) 144  Resp:  (!) 22 (!) 28 (!) 26  Temp: 98.7 F (37.1 C)     TempSrc: Oral     SpO2:  100% 99% 98%  Weight:      Height:        Intake/Output Summary (Last 24 hours) at 06/30/2018 1933 Last data filed at 06/30/2018 1800 Gross per 24 hour  Intake 4041.87 ml  Output 825 ml  Net 3216.87 ml   Filed Weights   06/28/18 0500 06/29/18 0500 06/30/18 0432  Weight: 73.2 kg (161 lb 6 oz) 73.8 kg (162 lb 11.2 oz) 77.9 kg (171 lb 11.8 oz)    Telemetry    Tachycardic Wide complex   110s to 130s   - Personally Reviewed  ECG    Probable atrial flutter with 2:1 block   Ventricular rate 111 NOnspecific IVCD  Personally Reviewed  Physical Exam   GEN: Patient intubated   Neck: Neck full    Cardiac: RRR Tachycardic   Respiratory: Coarse BS    GI: Soft,  nontender, non-distended  MS: Tr  edema; Feet warm Neuro:  INtubated   Sedated    Labs    Chemistry Recent Labs  Lab 06/29/18 0755 06/29/18 1111 06/29/18 1605 06/30/18 0329  NA 157* 156* 153* 151*  K 5.3* 5.3* 4.5 2.9*  CL 122* 123* 121* 119*  CO2 15* 12* 17* 23  GLUCOSE 66* 95 177* 154*  BUN 28* 32* 36* 39*  CREATININE 1.90* 2.14* 2.31* 1.71*  CALCIUM 9.7 9.5 9.3 8.6*  PROT 5.4* 5.4*  --  4.5*  ALBUMIN 3.2* 3.3*  --  2.7*  AST 3,365* 3,942*  --  3,278*  ALT 1,825* 1,976*  --  2,361*  ALKPHOS 409* 404*  --  318*  BILITOT 2.9* 2.9*  --  2.8*  GFRNONAA 27* 24* 21* 31*  GFRAA 32* 27* 25* 36*  ANIONGAP 20* 21* 15 9     Hematology Recent Labs  Lab 06/28/18 0443 06/29/18 0755  06/30/18 0329 06/30/18 0930 06/30/18 1331  WBC 9.7 31.9*  --  23.6*  --   --   RBC 5.04 4.67  --  4.18  --   --  HGB 9.4* 8.9*   < > 7.8* 7.5* 8.1*  HCT 35.1* 33.1*   < > 28.3* 27.4* 28.4*  MCV 69.6* 70.9*  --  67.7*  --   --   MCH 18.7* 19.1*  --  18.7*  --   --   MCHC 26.8* 26.9*  --  27.6*  --   --   RDW 21.2* 21.5*  --  20.6*  --   --   PLT 347 415*  --  271  --   --    < > = values in this interval not displayed.    Cardiac Enzymes Recent Labs  Lab 06/29/18 1605 06/29/18 2035 06/30/18 0211  TROPONINI 0.20* 0.28* 0.26*   No results for input(s): TROPIPOC in the last 168 hours.   BNP Recent Labs  Lab 06/29/18 1605  BNP 2,383.4*     DDimer No results for input(s): DDIMER in the last 168 hours.   Radiology    Ct Head Wo Contrast  Result Date: 06/29/2018 CLINICAL DATA:  Altered level of consciousness. Patient on anti coagulation and ventilated. Recent small infarct EXAM: CT HEAD WITHOUT CONTRAST TECHNIQUE: Contiguous axial images were obtained from the base of the skull through the vertex without intravenous contrast. 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 No hemorrhage.  Superficial and central atrophy with chronic small vessel ischemia. No intra-axial mass nor extra-axial fluid collections. Midline fourth ventricle and basal cisterns. Vascular: No hyperdense vessel sign. Skull: Intact Sinuses/Orbits: Bilateral cataract extractions. Intact orbits and globes. No acute sinus disease. Other: Clear mastoids. 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 No acute intracranial hemorrhage or new infarct identified. Atrophy with chronic small vessel ischemia. Electronically Signed   By: Ashley Royalty M.D.   On: 06/29/2018 20:23   Dg Chest Port 1 View  Result Date: 06/30/2018 CLINICAL DATA:  62 year old female with liver failure and recent right MCA cerebral infarct. EXAM: PORTABLE CHEST 1 VIEW COMPARISON:  Portable chest 06/29/2018 and earlier. FINDINGS: Portable AP semi upright view at 0413 hours. Endotracheal tube tip has been withdrawn some and projects in good position between the level the clavicles and carina. Enteric tube has been advanced, and courses to the visible distal stomach with tip not included. Stable left IJ central line. Stable lung volumes and mediastinal contour. Retrocardiac and patchy left perihilar opacity most resemble atelectasis. No pneumothorax, pulmonary edema, or pleural effusion identified. Negative visible bowel gas pattern. IMPRESSION: 1. Satisfactory ET tube position. Enteric tube has been advanced and could terminates in the distal stomach or proximal duodenum. 2. Stable ventilation with lower lung opacity most resembling atelectasis. Electronically Signed   By: Genevie Ann M.D.   On: 06/30/2018 06:15   Dg Chest Port 1 View  Result Date: 06/29/2018 CLINICAL DATA:  Respiratory failure and status post intubation and central line placement. EXAM: PORTABLE CHEST 1 VIEW COMPARISON:  06/23/2018 FINDINGS: Endotracheal tube present which extends to the carina and may just extend into the right mainstem bronchus. Retraction by a few cm is  recommended. Central line via the left jugular vein with the tip in the SVC. No pneumothorax. Lung volumes are relatively low with mild elevation of the right hemidiaphragm. There is no evidence of pulmonary edema, consolidation, nodule or pleural fluid. The heart is mildly enlarged. IMPRESSION: Endotracheal tube extends to the carina and may just extend into the right mainstem bronchus. Recommend retraction by a few cm. Central line tip  is in the SVC. No pneumothorax. Electronically Signed   By: Aletta Edouard M.D.   On: 06/29/2018 16:06   Dg Abd Portable 1v  Result Date: 06/29/2018 CLINICAL DATA:  Evaluate NG tube placement EXAM: PORTABLE ABDOMEN - 1 VIEW COMPARISON:  Earlier same day (multiple examinations) FINDINGS: Interval advancement of enteric tube with tip and side port now projected over the gastric antrum. Nonobstructive bowel gas pattern. No supine evidence of pneumoperitoneum. No definite pneumatosis or portal venous gas. Limited visualization of the lower thorax demonstrates tip of a central venous catheter overlying the superior cavoatrial junction. Endotracheal tube overlies the tracheal air column with tip superior to the carina. Additional linear radiopaque structure overlying the medial aspect the right lung apex may be external to the patient. Post cholecystectomy. No acute osseus abnormalities. IMPRESSION: Interval advancement of enteric tube with tip and side port projected over the gastric antrum. Electronically Signed   By: Sandi Mariscal M.D.   On: 06/29/2018 18:08   Dg Abd Portable 1v  Result Date: 06/29/2018 CLINICAL DATA:  Evaluate OG tube placement EXAM: PORTABLE ABDOMEN - 1 VIEW COMPARISON:  Earlier same day FINDINGS: Interval advancement of enteric tube with tip and side port now projected over the gastric fundus. Nonobstructive bowel gas pattern. No supine evidence of pneumoperitoneum. No definite pneumatosis or portal venous gas. Limited visualization of the lower thorax  demonstrates tip of a central venous catheter overlying the superior cavoatrial junction. Post cholecystectomy. No acute osseus abnormalities. IMPRESSION: Interval advancement of enteric tube with tip and side port projected over the gastric fundus. Electronically Signed   By: Sandi Mariscal M.D.   On: 06/29/2018 18:07   Dg Abd Portable 1v  Result Date: 06/29/2018 CLINICAL DATA:  NG tube placement EXAM: PORTABLE ABDOMEN - 1 VIEW COMPARISON:  None. FINDINGS: NG tube with tip in the proximal stomach. Side port is approximately 2 cm above the GE junction. Consider advancement by 5-7 cm. IMPRESSION: NG tube with tip in the stomach and side port above the GE junction Electronically Signed   By: Suzy Bouchard M.D.   On: 06/29/2018 16:42   US Liver Doppler  Result Date: 06/29/2018 CLINICAL DATA:  Nodular hepatic contour. Evaluate hepatic vasculature. EXAM: DUPLEX ULTRASOUND OF LIVER TECHNIQUE: Color and duplex Doppler ultrasound was performed to evaluate the hepatic in-flow and out-flow vessels. COMPARISON:  CT abdomen pelvis-06/23/2018; 06/16/2018 FINDINGS: Liver: There is diffuse increased slightly coarsened echogenicity of the hepatic parenchyma with mild nodularity of the hepatic contour as demonstrated on preceding abdominal CT. No discrete hepatic lesion. No definite intrahepatic biliary duct dilatation. No ascites. Portal Vein Velocities Main:  47 cm/sec Right: Not visualized (note, the right portal vein appears widely patent on contrast-enhanced abdominal CT performed 06/16/2018.) Left:  64 cm/sec Normal directional flow was demonstrated throughout the interrogated portions of the portal venous system Hepatic Vein Velocities Right:  92 cm/sec Middle:  79 cm/sec Left:  44 cm/sec Pulsatile flow was seen within the hepatic venous system. IVC: Present and patent with normal respiratory phasicity. Hepatic Artery Velocity:  119 cm/sec Splenic Vein Velocity:  17.5 cm/sec Varices: None visualized Ascites: None  visualized Spleen is normal in size measuring 9.7 x 7.1 x 3.1 cm with calculated volume of 112 cc. IMPRESSION: 1. Normal directional flow and velocities are demonstrated throughout the interrogated portions of the portal venous system. Note, the right portal vein was suboptimally evaluated sonographically though appeared widely patent on recent contrast-enhanced abdominal CT performed 06/16/2018. 2. Cirrhotic appearing liver without evidence  of portal venous hypertension, specifically, no evidence splenomegaly or ascites. 3. Pulsatile flow within the hepatic venous system - nonspecific though could be seen in the setting of right-sided heart failure. Clinical correlation is advised. Electronically Signed   By: Sandi Mariscal M.D.   On: 06/29/2018 17:47      Assessment & Plan    Pt remains critically ill   Now intubated, sedated     Events of past 24 hr reviewed    Pt remains tachycardic    BP now 140s     HR 110s to 140s  (atrial flutter) SInce admit she has had SR, Atrial fib and now atrial flutter   Labs still with evid of shock liver    Na has improved at 151   WBC still severely elevated at 23K     Latest Hgb 8.1  Coox is 55   Procalcitonin does not appear to be done    I have reviewed echo   I think LVEF on 06/23/18 was severely depressed at approximately 20%   RVEF is moderate to severely depressed  Imp: I still cannot explain events of Sun evening ,early Monday morning that lead to current clinical course   ? If patient became transiently hypotensive and decompensated from a heart failure standpoint     For now would recomm watching electrolytes closely   Cut back on IV fluids when able       TPA used in PICC line   If distal port works would follow CVP and COOX and treat as needed with lasix       No inotropes for now.   Can try very low dose lopressor for rate control     Will continue to follow.     Dorris Carnes    Will continue to follow         For questions or updates,  please contact [facility] Please consult [email or web address] for contact info under Cardiology/STEMI.      Signed, Dorris Carnes, MD  06/30/2018, 7:33 PM

## 2018-06-30 NOTE — Progress Notes (Signed)
CRITICAL VALUE ALERT  Critical Value:  7.88  Date & Time Notied:  7/15 2350  Provider Notified: Dr. Oletta Darter  Orders Received/Actions taken: 3 units FFP

## 2018-06-30 NOTE — Progress Notes (Signed)
CRITICAL VALUE ALERT  Critical Value:  Lactic acid 4.6  Date & Time Notied:  06/29/18 2120  Provider Notified: Dr. Oletta Darter  Orders Received/Actions taken: Acknowledged

## 2018-06-30 NOTE — Progress Notes (Signed)
[facility] ROUNDING NOTE   Subjective:   Interval History: none. Intubated  Objective:  Vital signs in last 24 hours:  Temp:  [99 F (37.2 C)-100 F (37.8 C)] 99.2 F (37.3 C) (07/16 0634) Pulse Rate:  [51-225] 117 (07/16 0756) Resp:  [10-29] 24 (07/16 0756) BP: (53-182)/(39-103) 117/81 (07/16 0756) SpO2:  [96 %-100 %] 100 % (07/16 0756) Arterial Line BP: (96-177)/(53-111) 138/73 (07/16 0645) FiO2 (%):  [40 %-100 %] 40 % (07/16 0756) Weight:  [171 lb 11.8 oz (77.9 kg)] 171 lb 11.8 oz (77.9 kg) (07/16 0432)  Weight change: 9 lb 0.6 oz (4.1 kg) Filed Weights   06/28/18 0500 06/29/18 0500 06/30/18 0432  Weight: 161 lb 6 oz (73.2 kg) 162 lb 11.2 oz (73.8 kg) 171 lb 11.8 oz (77.9 kg)    Intake/Output: I/O last 3 completed shifts: In: 4264.7 [I.V.:3359.1; Blood:311.7; NG/GT:200; IV Piggyback:394] Out: 2 [Urine:2]   Intake/Output this shift:  No intake/output data recorded.  Physical Exam: General: Intubated. Sedated. Nondiaphoretic, afebrile  CVS- RRR  RS- CTA bilaterally ABD- BS present soft non-distended EXT- no edema  Basic Metabolic Panel: Recent Labs  Lab 06/28/18 0443 06/29/18 0755 06/29/18 1111 06/29/18 1605 06/30/18 0329  NA 148* 157* 156* 153* 151*  K 3.5 5.3* 5.3* 4.5 2.9*  CL 117* 122* 123* 121* 119*  CO2 16* 15* 12* 17* 23  GLUCOSE 157* 66* 95 177* 154*  BUN 14 28* 32* 36* 39*  CREATININE 1.02* 1.90* 2.14* 2.31* 1.71*  CALCIUM 8.9 9.7 9.5 9.3 8.6*  MG  --   --   --  2.1  --     Liver Function Tests: Recent Labs  Lab 06/27/18 0614 06/28/18 0443 06/29/18 0755 06/29/18 1111 06/30/18 0329  AST 56* 78* 3,365* 3,942* 3,278*  ALT 75* 88* 1,825* 1,976* 2,361*  ALKPHOS 374* 435* 409* 404* 318*  BILITOT 1.5* 1.8* 2.9* 2.9* 2.8*  PROT 5.2* 5.7* 5.4* 5.4* 4.5*  ALBUMIN 3.0* 3.1* 3.2* 3.3* 2.7*   No results for input(s): LIPASE, AMYLASE in the last 168 hours. Recent Labs  Lab 06/26/18 0410 06/29/18 1244 06/30/18 0330  AMMONIA  38* 96* 62*    CBC: Recent Labs  Lab 06/25/18 0539 06/27/18 0614 06/28/18 0443 06/29/18 0755 06/29/18 1605 06/29/18 2035 06/30/18 0329  WBC 9.9 8.8 9.7 31.9*  --   --  23.6*  NEUTROABS 7.2 5.9 6.9 28.7*  --   --  21.2*  HGB 9.4* 9.1* 9.4* 8.9* 9.2* 8.2* 7.8*  HCT 33.4* 32.6* 35.1* 33.1* 34.7* 30.7* 28.3*  MCV 67.7* 67.4* 69.6* 70.9*  --   --  67.7*  PLT 558* 441* 347 415*  --   --  271    Cardiac Enzymes: Recent Labs  Lab 06/29/18 1605 06/29/18 2035 06/30/18 0211  TROPONINI 0.20* 0.28* 0.26*    BNP: Invalid input(s): POCBNP  CBG: Recent Labs  Lab 06/29/18 1708 06/29/18 2000 06/29/18 2327 06/30/18 0046 06/30/18 0321  GLUCAP 161* 202* 144* 139* 141*    Microbiology: Results for orders placed or performed during the hospital encounter of 06/23/18  MRSA PCR Screening     Status: None   Collection Time: 06/23/18 10:33 AM  Result Value Ref Range Status   MRSA by PCR NEGATIVE NEGATIVE Final    Comment:        The GeneXpert MRSA Assay (FDA approved for NASAL specimens only), is one component of a comprehensive MRSA colonization surveillance program. It is not intended to diagnose MRSA infection nor to   guide or monitor treatment for MRSA infections. Performed at [facility], [address] [place], [place] [address]   Culture, blood (routine x 2)     Status: None   Collection Time: 06/24/18 12:56 PM  Result Value Ref Range Status   Specimen Description   Final    BLOOD LEFT ANTECUBITAL Performed at [facility], [address] [place], [place] [address]    Special Requests   Final    BOTTLES DRAWN AEROBIC ONLY Blood Culture adequate volume Performed at [facility], [address] [place], [place] [address]    Culture   Final    NO GROWTH 5 DAYS Performed at [facility], [address] [place], [place] [address]    Report Status 06/29/2018 FINAL  Final  Culture, blood (routine x  2)     Status: None   Collection Time: 06/24/18  1:03 PM  Result Value Ref Range Status   Specimen Description   Final    BLOOD LEFT HAND Performed at [facility], [address] [place], [place] [address]    Special Requests   Final    BOTTLES DRAWN AEROBIC ONLY Blood Culture results may not be optimal due to an inadequate volume of blood received in culture bottles Performed at [facility], [address] [place], [place] [address]    Culture   Final    NO GROWTH 5 DAYS Performed at  [facility], [address] [place], [place] [address]    Report Status 06/29/2018 FINAL  Final    Coagulation Studies: Recent Labs    06/29/18 1605 06/29/18 2302  LABPROT 71.2* 65.7*  INR 8.74* 7.88*    Urinalysis: No results for input(s): COLORURINE, LABSPEC, PHURINE, GLUCOSEU, HGBUR, BILIRUBINUR, KETONESUR, PROTEINUR, UROBILINOGEN, NITRITE, LEUKOCYTESUR in the last 72 hours.  Invalid input(s): APPERANCEUR    Imaging: Ct Head Wo Contrast  Result Date: 06/29/2018 CLINICAL DATA:  Altered level of consciousness. Patient on anti coagulation and ventilated. Recent small infarct EXAM: CT HEAD WITHOUT CONTRAST TECHNIQUE: Contiguous axial images were obtained from the base of the skull through the vertex without intravenous contrast. 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 No hemorrhage. Superficial and central atrophy with chronic small vessel ischemia. No intra-axial mass nor extra-axial fluid collections. Midline fourth ventricle and basal cisterns. Vascular: No hyperdense vessel sign. Skull: Intact Sinuses/Orbits: Bilateral cataract extractions. Intact orbits and globes. No acute sinus disease. Other: Clear mastoids. 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 No acute intracranial hemorrhage or new infarct identified. Atrophy  with chronic small vessel ischemia. Electronically Signed   By: David  Kwon M.D.   On: 06/29/2018 20:23   Dg Chest Port 1 View  Result Date: 06/30/2018 CLINICAL DATA:  61-year-old female with liver failure and recent right MCA cerebral infarct. EXAM: PORTABLE CHEST 1 VIEW COMPARISON:  Portable chest 06/29/2018 and earlier. FINDINGS: Portable AP semi upright view at 0413 hours. Endotracheal tube tip has been withdrawn some and projects in good position between the level the clavicles and carina. Enteric tube has been advanced, and courses to the visible distal stomach with tip not included. Stable left IJ central line. Stable lung volumes and mediastinal contour. Retrocardiac and patchy left perihilar opacity most resemble atelectasis. No pneumothorax, pulmonary edema, or pleural effusion identified. Negative visible bowel gas pattern. IMPRESSION: 1. Satisfactory ET tube position. Enteric tube has been   advanced and could terminates in the distal stomach or proximal duodenum. 2. Stable ventilation with lower lung opacity most resembling atelectasis. Electronically Signed   By: Genevie Ann M.D.   On: 06/30/2018 06:15   Dg Chest Port 1 View  Result Date: 06/29/2018 CLINICAL DATA:  Respiratory failure and status post intubation and central line placement. EXAM: PORTABLE CHEST 1 VIEW COMPARISON:  06/23/2018 FINDINGS: Endotracheal tube present which extends to the carina and may just extend into the right mainstem bronchus. Retraction by a few cm is recommended. Central line via the left jugular vein with the tip in the SVC. No pneumothorax. Lung volumes are relatively low with mild elevation of the right hemidiaphragm. There is no evidence of pulmonary edema, consolidation, nodule or pleural fluid. The heart is mildly enlarged. IMPRESSION: Endotracheal tube extends to the carina and may just extend into the right mainstem bronchus. Recommend retraction by a few cm. Central line tip is in the SVC. No pneumothorax.  Electronically Signed   By: Aletta Edouard M.D.   On: 06/29/2018 16:06   Dg Abd Portable 1v  Result Date: 06/29/2018 CLINICAL DATA:  Evaluate NG tube placement EXAM: PORTABLE ABDOMEN - 1 VIEW COMPARISON:  Earlier same day (multiple examinations) FINDINGS: Interval advancement of enteric tube with tip and side port now projected over the gastric antrum. Nonobstructive bowel gas pattern. No supine evidence of pneumoperitoneum. No definite pneumatosis or portal venous gas. Limited visualization of the lower thorax demonstrates tip of a central venous catheter overlying the superior cavoatrial junction. Endotracheal tube overlies the tracheal air column with tip superior to the carina. Additional linear radiopaque structure overlying the medial aspect the right lung apex may be external to the patient. Post cholecystectomy. No acute osseus abnormalities. IMPRESSION: Interval advancement of enteric tube with tip and side port projected over the gastric antrum. Electronically Signed   By: Sandi Mariscal M.D.   On: 06/29/2018 18:08   Dg Abd Portable 1v  Result Date: 06/29/2018 CLINICAL DATA:  Evaluate OG tube placement EXAM: PORTABLE ABDOMEN - 1 VIEW COMPARISON:  Earlier same day FINDINGS: Interval advancement of enteric tube with tip and side port now projected over the gastric fundus. Nonobstructive bowel gas pattern. No supine evidence of pneumoperitoneum. No definite pneumatosis or portal venous gas. Limited visualization of the lower thorax demonstrates tip of a central venous catheter overlying the superior cavoatrial junction. Post cholecystectomy. No acute osseus abnormalities. IMPRESSION: Interval advancement of enteric tube with tip and side port projected over the gastric fundus. Electronically Signed   By: Sandi Mariscal M.D.   On: 06/29/2018 18:07   Dg Abd Portable 1v  Result Date: 06/29/2018 CLINICAL DATA:  NG tube placement EXAM: PORTABLE ABDOMEN - 1 VIEW COMPARISON:  None. FINDINGS: NG tube with tip  in the proximal stomach. Side port is approximately 2 cm above the GE junction. Consider advancement by 5-7 cm. IMPRESSION: NG tube with tip in the stomach and side port above the GE junction Electronically Signed   By: Suzy Bouchard M.D.   On: 06/29/2018 16:42   US Liver Doppler  Result Date: 06/29/2018 CLINICAL DATA:  Nodular hepatic contour. Evaluate hepatic vasculature. EXAM: DUPLEX ULTRASOUND OF LIVER TECHNIQUE: Color and duplex Doppler ultrasound was performed to evaluate the hepatic in-flow and out-flow vessels. COMPARISON:  CT abdomen pelvis-06/23/2018; 06/16/2018 FINDINGS: Liver: There is diffuse increased slightly coarsened echogenicity of the hepatic parenchyma with mild nodularity of the hepatic contour as demonstrated on preceding abdominal CT. No discrete hepatic lesion. No  definite intrahepatic biliary duct dilatation. No ascites. Portal Vein Velocities Main:  47 cm/sec Right: Not visualized (note, the right portal vein appears widely patent on contrast-enhanced abdominal CT performed 06/16/2018.) Left:  64 cm/sec Normal directional flow was demonstrated throughout the interrogated portions of the portal venous system Hepatic Vein Velocities Right:  92 cm/sec Middle:  79 cm/sec Left:  44 cm/sec Pulsatile flow was seen within the hepatic venous system. IVC: Present and patent with normal respiratory phasicity. Hepatic Artery Velocity:  119 cm/sec Splenic Vein Velocity:  17.5 cm/sec Varices: None visualized Ascites: None visualized Spleen is normal in size measuring 9.7 x 7.1 x 3.1 cm with calculated volume of 112 cc. IMPRESSION: 1. Normal directional flow and velocities are demonstrated throughout the interrogated portions of the portal venous system. Note, the right portal vein was suboptimally evaluated sonographically though appeared widely patent on recent contrast-enhanced abdominal CT performed 06/16/2018. 2. Cirrhotic appearing liver without evidence of portal venous hypertension,  specifically, no evidence splenomegaly or ascites. 3. Pulsatile flow within the hepatic venous system - nonspecific though could be seen in the setting of right-sided heart failure. Clinical correlation is advised. Electronically Signed   By: Sandi Mariscal M.D.   On: 06/29/2018 17:47     Medications:   . dextrose 100 mL/hr at 06/30/18 0654  . norepinephrine (LEVOPHED) Adult infusion Stopped (06/29/18 1618)  . pantoprozole (PROTONIX) infusion 8 mg/hr (06/30/18 0600)  . piperacillin-tazobactam (ZOSYN)  IV Stopped (06/30/18 0437)  . potassium chloride 10 mEq (06/30/18 0653)  .  sodium bicarbonate (isotonic) infusion in sterile water 100 mL/hr at 06/30/18 0600   . chlorhexidine gluconate (MEDLINE KIT)  15 mL Mouth Rinse BID  . famotidine  20 mg Oral BID  . feeding supplement (VITAL HIGH PROTEIN)  1,000 mL Per Tube Q24H  . fentaNYL (SUBLIMAZE) injection  100 mcg Intravenous Once  . free water  200 mL Per Tube Q8H  . insulin aspart  0-9 Units Subcutaneous Q4H  . lacosamide  100 mg Oral BID  . lactulose  30 g Per Tube BID  . mouth rinse  15 mL Mouth Rinse 10 times per day  . vancomycin variable dose per unstable renal function (pharmacist dosing)   Does not apply See admin instructions   fentaNYL (SUBLIMAZE) injection, fentaNYL (SUBLIMAZE) injection, midazolam, midazolam, ondansetron **OR** ondansetron (ZOFRAN) IV  Assessment/ Plan:  Brandi [name] is a 62 y.o. year-old female with a PMHx notable for HTN, HLD, seizure disorder, left breast CA s/p lumpectomy in 2009, GERD and MDD who presented to the [facility] ED on 07/09 w/ abdominal pain, nausea, vomiting, and weakness resulting in decreased PO intake. The patient became lethargic requiring intubation with acute encephalopathy most likely secondary to hyperammoniemia. She was noted to have marked hypernatremia to Na of 157. Given the presentation and lack of clear iatrogenic induction of the hypernatremia it appears that she has developed the  abnormality secondary to increased losses.   Plan: Hypernatremia: Most likely secondary to increased water losses. Unable to assess urinary output as it appears that this has not been charted along with the GI output.  -Free water deficit ~3L today improved from 4.7 the prior day. There was an error at listing the prior calculation as 2L deficit on 07/16.  -BMP in am -Agree with free water, patient placed on hypotonic sodium bicarb infusion as she was acidotic. This has now resolved and would recommend discontinuation of the sodium bicarb and resumption of the D5W as ABG pH now  alkalotic at 7.471. -Okay w/ free water as per NG tube.  -Urine sodium and UA ordered-not collected -Hold Keppra  BP/Volume: BP (53-182)/(39-103)  Hypokalemia: Potassium 2.9 noted overnight. Repleted with 40mEq IV.  -BMP after   Acute renal injury: Cr improved to 1.71 this am peaked at 2.31. Baseline of ~0.8 as per chart review. This is also consistent with free water losses resulting in decreased renal perfusion. Hepatorenal syndrome of notable concern but not felt to be the leading diagnosis.  -UA ordered -Continue D5W -BMP daily -Hold nephrotoxic agents  Acute encephalopathy: Unlikely to be related to the acute hypernatremia. Continue treating hyperammonemia as per GI.  Ammonia improved today.   Lawrence Harbrecht, MD Internal Medicine PGY-2  Please see Attending note/attestation for additional details and plan.   LOS: 7  Lawrence Harbrecht, MD Internal Medicine PGY-2 Pager # [phone]   Renal Attending: The AKI and hypernatremia are improving with hemodynamic support and IVFs.  Will defer further management to CCM. Will sign off.  C , MD   

## 2018-06-30 NOTE — Progress Notes (Signed)
PT Cancellation Note  Patient Details Name: Brandi [name] MRN: [id number] DOB: 02/11/1956   Cancelled Treatment:    Reason Eval/Treat Not Completed: (P) Medical issues which prohibited therapy Pt medical status has continued to decline since admittance and she is currently intubated and unresponsive. PT signing off until medical status is appropriate for evaluation and treatment. Please reorder PT at that time. Thank you for the referral.   Benjamine Mola B. Migdalia Dk PT, DPT Acute Rehabilitation  [phone] Pager [phone]      [place] 06/30/2018, 12:41 PM

## 2018-06-30 NOTE — Progress Notes (Signed)
eLink Physician-Brief Progress Note Patient Name: Brandi [name] DOB: 03/24/1956 MRN: [id number]   Date of Service  06/30/2018  HPI/Events of Note  K+ = 2.9 and Creatinine = 1.71.  eICU Interventions  Will replace K+.      Intervention Category Major Interventions: Electrolyte abnormality - evaluation and management  Codey Burling [place] 06/30/2018, 5:32 AM

## 2018-06-30 NOTE — Progress Notes (Signed)
Initial Nutrition Assessment  DOCUMENTATION CODES:   Obesity unspecified  INTERVENTION:   Reviewed GI note, no active bleeding, Hgb stable. Recommend starting TF Vital High Protein at rate of 20 ml/hr. Discussed with RN  Tube Feeding:  Vital High Protein @ 45 ml/hr Pro-Stat 30 mL daily Provides 110 g of protein, 1180 kcals, 875 mL of free water   NUTRITION DIAGNOSIS:   Inadequate oral intake related to acute illness as evidenced by NPO status.  GOAL:   Patient will meet greater than or equal to 90% of their needs  MONITOR:   Vent status, TF tolerance, Labs, Weight trends  REASON FOR ASSESSMENT:   Consult, Ventilator Enteral/tube feeding initiation and management  ASSESSMENT:   62 yo female admitted 7/9 with weakness, anorexia and lethargy with NASH cirrhosis with hepatic encephalopathy, new onset Afib with RVR, new onset HFrEF 30-35%, anemia. On 7/15,  pt's condition worsened with increased confusion/lethargy, tardive dyskinesia symptoms, AKI, shock liver and respiratory failure requiring intubation.  Pt with hx of HTN, HLD, seizure disorder, left breast cancer s/p lumpectomy 2008, GERD, depression, DM  7/15 Intubated  OG tube placed yesterday by MD, dark bloody return, GI following; stress ulceration with no plan for EGD at this time. Per GI notes today, Hgb relatively stable, no overt evidence of bleeding with clear OG aspirates  Per chart review, pt NPO/CL from 7/08 to 7/13. Dysphagia I diet ordered on 7/13 and then pt made NPO on 7/15. Limited documentation while on Dysphagia I diet but recorded po 15-40%. Pt with inadequate nutrition since admission (8 days)  Family reports pt has lost weight; pt was weighing 180 pounds and lost down to 166 pounds. Current wt 171 pounds. Net positive 8 L. Lowest weight 161 pounds this admission. Family reports this weight loss was recent  Pt does not meet clinical characteristics for malnutrition at present but is at risk and  recommend initiation of nutrition support as soon as medically able  Labs: sodium 151 (H), potassium 2.9 (L), BUN 39, Creatinine 1.71, ammonia 62, AST 3278, ALT 2361 Meds: D5 at 100 ml/hr, lactulose  NUTRITION - FOCUSED PHYSICAL EXAM:    Most Recent Value  Orbital Region  No depletion  Upper Arm Region  No depletion  Thoracic and Lumbar Region  No depletion  Buccal Region  No depletion  Temple Region  No depletion  Clavicle Bone Region  No depletion  Clavicle and Acromion Bone Region  No depletion  Scapular Bone Region  No depletion  Dorsal Hand  No depletion  Patellar Region  No depletion  Anterior Thigh Region  No depletion  Posterior Calf Region  No depletion  Edema (RD Assessment)  Mild       Diet Order:   Diet Order           Diet NPO time specified  Diet effective now          EDUCATION NEEDS:   Education needs have been addressed  Skin:  Skin Assessment: Reviewed RN Assessment  Last BM:  7/14  Height:   Ht Readings from Last 1 Encounters:  06/29/18 5\' 2"  (1.575 m)    Weight:   Wt Readings from Last 1 Encounters:  06/30/18 171 lb 11.8 oz (77.9 kg)    Ideal Body Weight:  50 kg  BMI:  Body mass index is 31.41 kg/m.  Estimated Nutritional Needs:   Kcal:  1000-1200 kcals   Protein:  100-115 g  Fluid:  >/= 1.4 L  Kerman Passey MS, RD, [place], [place] [phone] Pager  [phone] Weekend/On-Call Pager

## 2018-06-30 NOTE — Progress Notes (Addendum)
PULMONARY / CRITICAL CARE MEDICINE   Name: Brandi [name] MRN: [id number] DOB: 03/31/1956    ADMISSION DATE:  06/23/2018 CONSULTATION DATE: 06/29/2018  REFERRING MD: Dr. Grunz  CHIEF COMPLAINT: Altered mental status  HISTORY OF PRESENT ILLNESS:  61 y/o F who presented to [facility] on 7/9 with abdominal pain, nausea, vomiting, decreased oral intake and weakness.    She was reportedly evaluated by her PCP prior to admit with elevated LFT's (AST 302, ALT 611, AP 380).  Follow up CT of the abdomen demonstrated prominent caudate lobe and surface irregularity of the liver.   In the ER, US of the abdomen confirmed the CT findings of the liver.  She was noted to be lethargic and have asterixis on exam.  Ammonia 22 (7/10) > 38 (7/12).  The patient had AFwRVR and was treated with cardizem.  She was admitted for further evaluation.  The patient was seen by Cardiology and transitioned to metoprolol for rate control due to decreased LVEF (30-35%). Labs showed a microcytic anemia with ferritin of 9.  GI was consulted for evaluation with recommendations for lactulose and eventual endoscopic evaluation.  She had ongoing encephalopathy and a CT of the head was obtained which showed a right frontal operculum CVA.  Neurology evaluated the patient and felt the stroke was likely related to AF but not the cause of her mental status.  She was recommended anticoagulation and passed a swallow evaluation converting from heparin to eliquis.  On 7/15, her confusion, tardive dyskinesia symptoms and lethargy worsened as well as her LFT's (AST 3942, ALT 1976, ALK Phos 404), renal function and WBC.  PCCM consulted for evaluation.    SUBJECTIVE:  Ongoing agitation overnight requiring multiple boluses of sedation, no continuous infusion S/p 3 units PRBC for INR 7.88 SBT 10/5 from 0700 - 1100; placed back for worsening agitation/hyertension/tachycardia  VITAL SIGNS: BP 129/72   Pulse (!) 58   Temp 99.4 F (37.4 C) (Axillary)    Resp 18   Ht 5' 2" (1.575 m)   Wt 171 lb 11.8 oz (77.9 kg)   SpO2 100%   BMI 31.41 kg/m   HEMODYNAMICS:    VENTILATOR SETTINGS: Vent Mode: CPAP;PSV FiO2 (%):  [40 %-100 %] 40 % Set Rate:  [18 bmp] 18 bmp Vt Set:  [400 mL-480 mL] 400 mL PEEP:  [5 cmH20] 5 cmH20 Pressure Support:  [10 cmH20] 10 cmH20 Plateau Pressure:  [12 cmH20-17 cmH20] 16 cmH20  INTAKE / OUTPUT: I/O last 3 completed shifts: In: 4264.7 [I.V.:3359.1; Blood:311.7; NG/GT:200; IV Piggyback:394] Out: 2 [Urine:2]  PHYSICAL EXAMINATION: General:  Critically ill female lying in bed initially severely agitated, resolved after sedation and return to full MV support  HEENT: MM pink/moist/ ETT/ OGT,  Neuro: does not open eyes, MAE- non purposeful, not f/c CV: IRIR, +1 pulses PULM: tachypneic/agitation on PSV, placed back on full MV, improved tachypnea, lungs bilaterally coarse GI: obese, NT, hyperBS Extremities: cool/moist, +1 generalized edema Skin: no rashes, no sites of bleeding noted  LABS:  BMET Recent Labs  Lab 06/29/18 1111 06/29/18 1605 06/30/18 0329  NA 156* 153* 151*  K 5.3* 4.5 2.9*  CL 123* 121* 119*  CO2 12* 17* 23  BUN 32* 36* 39*  CREATININE 2.14* 2.31* 1.71*  GLUCOSE 95 177* 154*    Electrolytes Recent Labs  Lab 06/29/18 1111 06/29/18 1605 06/30/18 0329  CALCIUM 9.5 9.3 8.6*  MG  --  2.1  --     CBC Recent Labs  Lab 06/28/18   6606 06/29/18 0755  06/29/18 2035 06/30/18 0329 06/30/18 0930  WBC 9.7 31.9*  --   --  23.6*  --   HGB 9.4* 8.9*   < > 8.2* 7.8* 7.5*  HCT 35.1* 33.1*   < > 30.7* 28.3* 27.4*  PLT 347 415*  --   --  271  --    < > = values in this interval not displayed.    Coag's Recent Labs  Lab 06/27/18 0614 06/29/18 1605 06/29/18 2302  INR 1.53 8.74* 7.88*    Sepsis Markers Recent Labs  Lab 06/29/18 1244 06/29/18 1605 06/29/18 2035  LATICACIDVEN 10.8* 8.3* 4.6*    ABG Recent Labs  Lab 06/29/18 1606 06/30/18 0510  PHART 7.263* 7.471*   PCO2ART 35.2 32.2  PO2ART 333.0* 98.1    Liver Enzymes Recent Labs  Lab 06/29/18 0755 06/29/18 1111 06/30/18 0329  AST 3,365* 3,942* 3,278*  ALT 1,825* 1,976* 2,361*  ALKPHOS 409* 404* 318*  BILITOT 2.9* 2.9* 2.8*  ALBUMIN 3.2* 3.3* 2.7*    Cardiac Enzymes Recent Labs  Lab 06/29/18 1605 06/29/18 2035 06/30/18 0211  TROPONINI 0.20* 0.28* 0.26*    Glucose Recent Labs  Lab 06/29/18 1708 06/29/18 2000 06/29/18 2327 06/30/18 0046 06/30/18 0321 06/30/18 0820  GLUCAP 161* 202* 144* 139* 141* 150*    Imaging  STUDIES:  CT Head 7/9 >> atrophy with small vessel chronic ischemic changes of deep cerebral white matter CT Renal Study 7/9 >> no acute findings, bilateral non-obstructing renal stones ECHO 7/9 >> LV mildly dilated, wall thickness was increased in a pattern of moderate LVH, LVEF 30-35%, severe hypokinesis of the anteroseptal, anterior, inferoseptal & apical myocardium, mild to moderate MV regurgitation, LA severely dilated, RV moderately dilated, RA mod-severely dilated, PA pressure 33mHg, trivial pericardial effusion. CTA Head, Neck 7/12 >> minimal calcified plaque at the carotid bifurcation regions but no stenosis or irregularity, no large vessel occlusions, unable to specifically identify the missing branch vessel responsible for the right posterior frontal infarction but this is presumably an occluded M3 or distal branch CT Head w/o 7/12 >> the small right middle cerebral artery distribution, posterolateral frontal lobe infarct has evolved since the prior CT, it is now seen as a well defined area of hypoattenuation, no new areas of infarct, no ICH  CT head 7/15 >> Redemonstration of right posterior frontal lobe infarct. No acute intracranial hemorrhage or new infarct identified.  Atrophy with chronic small vessel ischemia.  CULTURES: BCx2 7/10 >> negative   ANTIBIOTICS: Vanco 7/15 >>  Zoysn 7/15 >>   SIGNIFICANT EVENTS: 7/09  Admit  7/15  PCCM consulted  with AMS, acute liver failure  LINES/TUBES: ETT 7/15 >>  DISCUSSION: 62y/o F admitted with weakness, decreased intake and lethargy.  Work up concerning for NASH, [place]  She developed AMS > small infarct thought not related to AMS.  Recommended for anticoagulation by Cardiology/Neurology, was transitioned from heparin to eliquis.  The patient deteriorated on 7/15 with a rise in LFT's, AKI, confusion/AMS, worsening of tardive dyskinesia type symptoms.  Transferred to ICU.    ASSESSMENT / PLAN:  PULMONARY A: Acute Respiratory Insufficiency - in setting of liver failure  Bilateral Pleural Effusions - suspect in setting of liver disease, HF, AKI P:   Continue full MV support 8 cc/kg Daily SBT attempts CXR and ABG in am  VAP bundle SUP w/protonix gtt  CARDIOVASCULAR A:  HFrEF A. fib with RVR HTN - mildly elevated troponin's; EKG unchanged  P:  Continue tele  monitoring Goal MAP > 65, levophed has not been needed Cardiology following, appreciate assistance Hold further coagulation  RENAL A:   AKI - in setting of liver injury, NASH Hypernatremia Hypokalemia  P:   Appreciate Renal assistance Bicarb d/c previously, continued D5W at 100 Free water 2107m q 8 hr Will place foley when INR safe, until then continue purwick and bladder scan prn  Trend BMP / mag/ phos/ daily/ urinary output Replace electrolytes as indicated  GASTROINTESTINAL A:   [place] Cirrhosis with Hepatic Encephalopathy Hepatic dysfunction/ shock  Suspected UGIB GERD - unclear in what precipitated change in events LFTs/ decompensation since 7/14 - 7/15 UK[place]iver w/doppler with no evidence of portal venous thrombosis  - husband reports last BM 7/14 - AST peaked 4/15, hopefully ALT peaking today P:   Appreciate GI input Hold on TF per GI/ NPO Continue protonix gtt, d/c  Continue lactulose BID- goal to  Trend ammonia- 96 -> 62 Trend LFts and coags  HEMATOLOGIC A:   Iron Deficiency Anemia  Bleeding  risk  Hx Breast Cancer - s/p 3 units FFP 7/16 am P:  Vit K 5 mg now Recheck INR 3 hours post transfusion H/H q 6 hrs  daily CBC and coags Transfuse for hgb < 7 Monitor for bleeding SCDs only    INFECTIOUS A:   Leukocytosis Rule Out Sepsis - SBP?, other etiology  - remains afebrile P:   Follow cultures Continue empiric vanc / zosyn for now Trend WBC/ fever curve   ENDOCRINE A:   DM 2 Hypoglycemia  P:   CBG q 4 SSI, sensitive scale   NEUROLOGIC A:   Acute metabolic encephalopathy Acute delirium Right Frontal Operculum infarct - felt related to AF/embolic but not all cause of mental status Right-sided weakness Seizure disorder  Schizophrenia  P: RASS goal: 0 to -1  Continue PRN fentanyl and versed given hepatic failure Ongoing neuro assessments Correct metabolic derangements as above  Hold Buspar, Effexor > restart once acute phase illness resolved as can withdrawal    FAMILY  - Updates: Husband updated at bedside 7/16.  Brought her living will which will be placed on chart which coincides with earlier wishes to continue to treat reversible processes but would not want prolonged support.    CCT 55 mins  BKennieth Rad AGACNP-BC [facility] Pgr: [phone] if no answer [phone]/16/2019, 12:24 PM    Attending Note:  I have examined patient, reviewed labs, studies and notes. I have discussed the case with B Simpson, and I agree with the data and plans as amended above.   62year old woman with a history of hypertension, hyperlipidemia, seizure disorder, left breast cancer status post lumpectomy 2009.  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  She was admitted with abdominal pain, nausea/vomiting, overall weakness 7/9 where she was found to have an elevated ammonia, mild transaminitis, atrial fibrillation with RVR.  Her cardiac evaluation revealed an apparent new  HFrEF (30-35%).  She experienced persistent encephalopathy despite lactulose.  Neurological evaluation revealed a posterior right frontal stroke in the MCA distribution.  Course has also been complicated by some dystonic movements concerning for possible tardive dyskinesia.    Her hepatic evaluation included standard and Doppler abd ultrasounds that did not show any abnormalities beyond the known enlargement and nodularity.   On 7/15 she was significantly more lethargic, exhibited evolving respiratory distress, had worsening of her tardive dyskinesia type  symptoms.  She also had a profound worsening of her transaminitis, coagulopathy, evolving renal failure.  It is unclear what caused her decompensation overnight-there were no identified periods of hypotension.  She did receive Haldol.  No other culprit medications noted.  She required intubation for airway protection 7/15.  Empiric antibiotics were started given the acute decompensation although no clear source for sepsis identified.  BuSpar, Effexor, Haldol all stopped.   Vitals:   06/30/18 1015 06/30/18 1030 06/30/18 1100 06/30/18 1147  BP:    (!) 142/86  Pulse: (!) 107 (!) 58 (!) 117 (!) 115  Resp: 17 18 (!) 24 (!) 27  Temp:  99.4 F (37.4 C)  98.9 F (37.2 C)  TempSrc:  Axillary  Oral  SpO2: 100% 100% 99% 99%  Weight:      Height:      On evaluation she is a critically ill woman, mechanically ventilated.  She is intermittently agitated, moving randomly in the bed.  No purposeful movement.  She did not open eyes or follow any commands to voice.  She did have grimace with pain.  Her endotracheal tube is in place, OG tube in place.  His heart is irregularly irregular without a murmur.  Lungs are coarse bilaterally without any wheezing.  Abdomen is obese, soft, with positive bowel sounds.  She has 1+ generalized edema.  No bruising, no bleeding, no rash.  We will continue her lactulose, adjust sedating medications as able.  I like to try to  avoid continuous infusions given her impaired hepatic clearance.  Haldol discontinued.  Okay to put spontaneous breathing trials but she does not have the mental status at this time for a successful extubation.  Give vitamin K 7/16.  Follow LFTs, coags for improvement.  Unclear whether there is any source of sepsis here.  We will continue vancomycin and Zosyn for now, low threshold to de-escalate next 24 to 48 hours.  Independent critical care time is 35 minutes.   Baltazar Apo, MD, PhD 06/30/2018, 2:09 PM [facility] [phone] or if no answer [phone]

## 2018-06-30 NOTE — Progress Notes (Signed)
SLP Cancellation Note  Patient Details Name: Brandi [name] MRN: [id number] DOB: Mar 30, 1956   Cancelled treatment:       Reason Eval/Treat Not Completed: Medical issues which prohibited therapy. Pt transferred to ICU, intubated. Will f/u as indicated.   Germain Osgood 06/30/2018, 9:41 AM  Germain Osgood, M.A. CCC-SLP [phone]

## 2018-06-30 NOTE — Care Management Note (Signed)
Case Management Note  Patient Details  Name: Brandi [name] MRN: [id number] Date of Birth: June 20, 1956  Subjective/Objective:   Presents with weakness, decreased intake and lethargy.  Work up concerning for NASH, [place].  She developed AMS > small infarct thought not related to AMS.    The patient deteriorated on 7/15 with a rise in LFT's, AKI, confusion/AMS,  Transferred to ICU                   Action/Plan: NCM will follow for dc needs.   Expected Discharge Date:  (unknown)               Expected Discharge Plan:     In-House Referral:     Discharge planning Services  CM Consult  Post Acute Care Choice:    Choice offered to:     DME Arranged:    DME Agency:     HH Arranged:    HH Agency:     Status of Service:  In process, will continue to follow  If discussed at Long Length of Stay Meetings, dates discussed:    Additional Comments:  [name], RN 06/30/2018, 5:20 PM

## 2018-06-30 NOTE — Consult Note (Signed)
Subjective: No acute events.  Objective: Vital signs in last 24 hours: Temp:  [98.8 F (37.1 C)-100 F (37.8 C)] 98.9 F (37.2 C) (07/16 1147) Pulse Rate:  [51-225] 115 (07/16 1147) Resp:  [10-30] 27 (07/16 1147) BP: (53-182)/(39-103) 142/86 (07/16 1147) SpO2:  [96 %-100 %] 99 % (07/16 1147) Arterial Line BP: (95-177)/(53-111) 146/78 (07/16 1100) FiO2 (%):  [40 %-100 %] 40 % (07/16 1147) Weight:  [77.9 kg (171 lb 11.8 oz)] 77.9 kg (171 lb 11.8 oz) (07/16 0432) Last BM Date: 06/28/18  Intake/Output from previous day: 07/15 0701 - 07/16 0700 In: 4264.7 [I.V.:3359.1; Blood:311.7; NG/GT:200; IV Piggyback:394] Out: 2 [Urine:2] Intake/Output this shift: Total I/O In: 811 [Blood:661; NG/GT:150] Out: 400 [Urine:300; Emesis/NG output:100]  General appearance: Intubated, spontaneous movements GI: soft, non-tender; bowel sounds normal; no masses,  no organomegaly  Lab Results: Recent Labs    06/28/18 0443 06/29/18 0755  06/30/18 0329 06/30/18 0930 06/30/18 1331  WBC 9.7 31.9*  --  23.6*  --   --   HGB 9.4* 8.9*   < > 7.8* 7.5* 8.1*  HCT 35.1* 33.1*   < > 28.3* 27.4* 28.4*  PLT 347 415*  --  271  --   --    < > = values in this interval not displayed.   BMET Recent Labs    06/29/18 1111 06/29/18 1605 06/30/18 0329  NA 156* 153* 151*  K 5.3* 4.5 2.9*  CL 123* 121* 119*  CO2 12* 17* 23  GLUCOSE 95 177* 154*  BUN 32* 36* 39*  CREATININE 2.14* 2.31* 1.71*  CALCIUM 9.5 9.3 8.6*   LFT Recent Labs    06/30/18 0329  PROT 4.5*  ALBUMIN 2.7*  AST 3,278*  ALT 2,361*  ALKPHOS 318*  BILITOT 2.8*   PT/INR Recent Labs    06/29/18 1605 06/29/18 2302  LABPROT 71.2* 65.7*  INR 8.74* 7.88*   Hepatitis Panel No results for input(s): HEPBSAG, HCVAB, HEPAIGM, HEPBIGM in the last 72 hours. C-Diff No results for input(s): CDIFFTOX in the last 72 hours. Fecal Lactopherrin No results for input(s): FECLLACTOFRN in the last 72 hours.  Studies/Results: Ct Head Wo  Contrast  Result Date: 06/29/2018 CLINICAL DATA:  Altered level of consciousness. Patient on anti coagulation and ventilated. Recent small infarct EXAM: CT HEAD WITHOUT CONTRAST TECHNIQUE: Contiguous axial images were obtained from the base of the skull through the vertex without intravenous contrast. 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 No hemorrhage. Superficial and central atrophy with chronic small vessel ischemia. No intra-axial mass nor extra-axial fluid collections. Midline fourth ventricle and basal cisterns. Vascular: No hyperdense vessel sign. Skull: Intact Sinuses/Orbits: Bilateral cataract extractions. Intact orbits and globes. No acute sinus disease. Other: Clear mastoids. 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 No acute intracranial hemorrhage or new infarct identified. Atrophy with chronic small vessel ischemia. Electronically Signed   By: Ashley Royalty M.D.   On: 06/29/2018 20:23   Dg Chest Port 1 View  Result Date: 06/30/2018 CLINICAL DATA:  62 year old female with liver failure and recent right MCA cerebral infarct. EXAM: PORTABLE CHEST 1 VIEW COMPARISON:  Portable chest 06/29/2018 and earlier. FINDINGS: Portable AP semi upright view at 0413 hours. Endotracheal tube tip has been withdrawn some and projects in good position between the level the clavicles and carina. Enteric tube has been advanced, and courses to the visible distal stomach with tip not included. Stable left IJ central line. Stable  lung volumes and mediastinal contour. Retrocardiac and patchy left perihilar opacity most resemble atelectasis. No pneumothorax, pulmonary edema, or pleural effusion identified. Negative visible bowel gas pattern. IMPRESSION: 1. Satisfactory ET tube position. Enteric tube has been advanced and could terminates in the distal stomach or proximal duodenum. 2. Stable  ventilation with lower lung opacity most resembling atelectasis. Electronically Signed   By: Genevie Ann M.D.   On: 06/30/2018 06:15   Dg Chest Port 1 View  Result Date: 06/29/2018 CLINICAL DATA:  Respiratory failure and status post intubation and central line placement. EXAM: PORTABLE CHEST 1 VIEW COMPARISON:  06/23/2018 FINDINGS: Endotracheal tube present which extends to the carina and may just extend into the right mainstem bronchus. Retraction by a few cm is recommended. Central line via the left jugular vein with the tip in the SVC. No pneumothorax. Lung volumes are relatively low with mild elevation of the right hemidiaphragm. There is no evidence of pulmonary edema, consolidation, nodule or pleural fluid. The heart is mildly enlarged. IMPRESSION: Endotracheal tube extends to the carina and may just extend into the right mainstem bronchus. Recommend retraction by a few cm. Central line tip is in the SVC. No pneumothorax. Electronically Signed   By: Aletta Edouard M.D.   On: 06/29/2018 16:06   Dg Abd Portable 1v  Result Date: 06/29/2018 CLINICAL DATA:  Evaluate NG tube placement EXAM: PORTABLE ABDOMEN - 1 VIEW COMPARISON:  Earlier same day (multiple examinations) FINDINGS: Interval advancement of enteric tube with tip and side port now projected over the gastric antrum. Nonobstructive bowel gas pattern. No supine evidence of pneumoperitoneum. No definite pneumatosis or portal venous gas. Limited visualization of the lower thorax demonstrates tip of a central venous catheter overlying the superior cavoatrial junction. Endotracheal tube overlies the tracheal air column with tip superior to the carina. Additional linear radiopaque structure overlying the medial aspect the right lung apex may be external to the patient. Post cholecystectomy. No acute osseus abnormalities. IMPRESSION: Interval advancement of enteric tube with tip and side port projected over the gastric antrum. Electronically Signed   By: Sandi Mariscal M.D.   On: 06/29/2018 18:08   Dg Abd Portable 1v  Result Date: 06/29/2018 CLINICAL DATA:  Evaluate OG tube placement EXAM: PORTABLE ABDOMEN - 1 VIEW COMPARISON:  Earlier same day FINDINGS: Interval advancement of enteric tube with tip and side port now projected over the gastric fundus. Nonobstructive bowel gas pattern. No supine evidence of pneumoperitoneum. No definite pneumatosis or portal venous gas. Limited visualization of the lower thorax demonstrates tip of a central venous catheter overlying the superior cavoatrial junction. Post cholecystectomy. No acute osseus abnormalities. IMPRESSION: Interval advancement of enteric tube with tip and side port projected over the gastric fundus. Electronically Signed   By: Sandi Mariscal M.D.   On: 06/29/2018 18:07   Dg Abd Portable 1v  Result Date: 06/29/2018 CLINICAL DATA:  NG tube placement EXAM: PORTABLE ABDOMEN - 1 VIEW COMPARISON:  None. FINDINGS: NG tube with tip in the proximal stomach. Side port is approximately 2 cm above the GE junction. Consider advancement by 5-7 cm. IMPRESSION: NG tube with tip in the stomach and side port above the GE junction Electronically Signed   By: Suzy Bouchard M.D.   On: 06/29/2018 16:42   US Liver Doppler  Result Date: 06/29/2018 CLINICAL DATA:  Nodular hepatic contour. Evaluate hepatic vasculature. EXAM: DUPLEX ULTRASOUND OF LIVER TECHNIQUE: Color and duplex Doppler ultrasound was performed to evaluate the hepatic in-flow and out-flow vessels.  COMPARISON:  CT abdomen pelvis-06/23/2018; 06/16/2018 FINDINGS: Liver: There is diffuse increased slightly coarsened echogenicity of the hepatic parenchyma with mild nodularity of the hepatic contour as demonstrated on preceding abdominal CT. No discrete hepatic lesion. No definite intrahepatic biliary duct dilatation. No ascites. Portal Vein Velocities Main:  47 cm/sec Right: Not visualized (note, the right portal vein appears widely patent on contrast-enhanced abdominal  CT performed 06/16/2018.) Left:  64 cm/sec Normal directional flow was demonstrated throughout the interrogated portions of the portal venous system Hepatic Vein Velocities Right:  92 cm/sec Middle:  79 cm/sec Left:  44 cm/sec Pulsatile flow was seen within the hepatic venous system. IVC: Present and patent with normal respiratory phasicity. Hepatic Artery Velocity:  119 cm/sec Splenic Vein Velocity:  17.5 cm/sec Varices: None visualized Ascites: None visualized Spleen is normal in size measuring 9.7 x 7.1 x 3.1 cm with calculated volume of 112 cc. IMPRESSION: 1. Normal directional flow and velocities are demonstrated throughout the interrogated portions of the portal venous system. Note, the right portal vein was suboptimally evaluated sonographically though appeared widely patent on recent contrast-enhanced abdominal CT performed 06/16/2018. 2. Cirrhotic appearing liver without evidence of portal venous hypertension, specifically, no evidence splenomegaly or ascites. 3. Pulsatile flow within the hepatic venous system - nonspecific though could be seen in the setting of right-sided heart failure. Clinical correlation is advised. Electronically Signed   By: Sandi Mariscal M.D.   On: 06/29/2018 17:47    Medications:  Scheduled: . chlorhexidine gluconate (MEDLINE KIT)  15 mL Mouth Rinse BID  . feeding supplement (VITAL HIGH PROTEIN)  1,000 mL Per Tube Q24H  . fentaNYL (SUBLIMAZE) injection  100 mcg Intravenous Once  . free water  200 mL Per Tube Q8H  . insulin aspart  0-9 Units Subcutaneous Q4H  . lacosamide  100 mg Oral BID  . lactulose  30 g Per Tube BID  . mouth rinse  15 mL Mouth Rinse 10 times per day  . vancomycin variable dose per unstable renal function (pharmacist dosing)   Does not apply See admin instructions   Continuous: . dextrose 100 mL/hr at 06/30/18 0830  . norepinephrine (LEVOPHED) Adult infusion Stopped (06/29/18 1618)  . pantoprozole (PROTONIX) infusion 8 mg/hr (06/30/18 1335)  .  phytonadione (VITAMIN K) IV 5 mg (06/30/18 1308)  . piperacillin-tazobactam (ZOSYN)  IV 2.25 g (06/30/18 1307)    Assessment/Plan: 1) Hepatic failure. 2) AMS. 3) CVA. 4) Respiratory failure. 5) AKI - improving. 6) CHF.   The patient remains critically ill.  Her RUQ U/S did not show any evidence of portal HTN, however, there was evidence of right CHF.  This is consistent with her recent echo showing an EF of 30-35% and the marked elevation of her BNP.  She remains in NSR.  Her coagulopathy remains significant even though it has decreased from 7.8 down to 3.37.  Currently, she is receiving Vit K.  Her HGB is relatively stable.  There is no overt evidence of bleeding at this time of evaluation, I.e., clear OG and NG tube aspirates.    Plan: 1) No new GI recommendations.  Continue with supportive care.  LOS: 7 days   Lajuane Leatham D 06/30/2018, 2:00 PM

## 2018-06-30 NOTE — Progress Notes (Signed)
Pharmacy Antibiotic Note  Brandi [name] is a 62 y.o. female admitted on 06/23/2018 with sepsis.  Pharmacy has been consulted for vancomycin and Zosyn dosing.  Vancomycin random level this evening = 6 (drawn 2 hours late)  Plan: Increase Zosyn to 3.375 grams iv Q 8 hours Vancomycin 1 gram iv Q 24 hours   Height: 5\' 2"  (157.5 cm) Weight: 171 lb 11.8 oz (77.9 kg) IBW/kg (Calculated) : 50.1  Temp (24hrs), Avg:99.3 F (37.4 C), Min:98.7 F (37.1 C), Max:99.9 F (37.7 C)  Recent Labs  Lab 06/25/18 0539 06/27/18 0614 06/28/18 0443 06/29/18 0755 06/29/18 1111 06/29/18 1244 06/29/18 1605 06/29/18 2035 06/30/18 0329 06/30/18 1703  WBC 9.9 8.8 9.7 31.9*  --   --   --   --  23.6*  --   CREATININE  --  0.91 1.02* 1.90* 2.14*  --  2.31*  --  1.71*  --   LATICACIDVEN  --   --   --   --   --  10.8* 8.3* 4.6*  --   --   VANCORANDOM  --   --   --   --   --   --   --   --   --  6    Estimated Creatinine Clearance: 33 mL/min (A) (by C-G formula based on SCr of 1.71 mg/dL (H)).    Allergies  Allergen Reactions  . Penicillins Rash    Has patient had a PCN reaction causing immediate rash, facial/tongue/throat swelling, SOB or lightheadedness with hypotension: No Has patient had a PCN reaction causing severe rash involving mucus membranes or skin necrosis: No Has patient had a PCN reaction that required hospitalization No Has patient had a PCN reaction occurring within the last 10 years: No If all of the above answers are "NO", then may proceed with Cephalosporin use.     Antimicrobials this admission: Vacnomycin 7/15 >>  Zosyn 7/15 >>   Dose adjustments this admission: n/a  Microbiology results: 7/10 BCx: neg 7/19 MRSA PCR: neg  Thank you for allowing pharmacy to be a part of this patient's care. Anette Guarneri, PharmD [phone] 06/30/2018 8:08 PM

## 2018-06-30 NOTE — Progress Notes (Signed)
OT Cancellation Note  Patient Details Name: Brandi [name] MRN: [id number] DOB: 1956-03-26   Cancelled Treatment:    Reason Eval/Treat Not Completed: Medical issues which prohibited therapy. Discussed with RN who requested OT hold at this time due to medical status. Pt currently intubated and with diminished responsivity. Will check back as appropriate.   Norman Herrlich, MS OTR/L  Pager: [phone] 06/30/2018, 1:28 PM

## 2018-07-01 ENCOUNTER — Inpatient Hospital Stay (HOSPITAL_COMMUNITY): Payer: Medicare Other

## 2018-07-01 DIAGNOSIS — K7200 Acute and subacute hepatic failure without coma: Secondary | ICD-10-CM

## 2018-07-01 DIAGNOSIS — K7290 Hepatic failure, unspecified without coma: Secondary | ICD-10-CM

## 2018-07-01 LAB — GLUCOSE, CAPILLARY
GLUCOSE-CAPILLARY: 190 mg/dL — AB (ref 70–99)
GLUCOSE-CAPILLARY: 173 mg/dL — AB (ref 70–99)
Glucose-Capillary: 167 mg/dL — ABNORMAL HIGH (ref 70–99)
Glucose-Capillary: 173 mg/dL — ABNORMAL HIGH (ref 70–99)
Glucose-Capillary: 256 mg/dL — ABNORMAL HIGH (ref 70–99)
Glucose-Capillary: 193 mg/dL — ABNORMAL HIGH (ref 70–99)

## 2018-07-01 LAB — CBC WITH DIFFERENTIAL/PLATELET
Basophils Absolute: 0 10*3/uL (ref 0.0–0.1)
Basophils Relative: 0 %
EOS PCT: 0 %
Eosinophils Absolute: 0 10*3/uL (ref 0.0–0.7)
HEMATOCRIT: 34 % — AB (ref 36.0–46.0)
Hemoglobin: 9.4 g/dL — ABNORMAL LOW (ref 12.0–15.0)
LYMPHS PCT: 3 %
Lymphs Abs: 0.9 10*3/uL (ref 0.7–4.0)
MCH: 18.6 pg — ABNORMAL LOW (ref 26.0–34.0)
MCHC: 27.6 g/dL — ABNORMAL LOW (ref 30.0–36.0)
MCV: 67.2 fL — ABNORMAL LOW (ref 78.0–100.0)
MONO ABS: 1.8 10*3/uL — AB (ref 0.1–1.0)
Monocytes Relative: 6 %
NEUTROS PCT: 91 %
Neutro Abs: 27.8 10*3/uL — ABNORMAL HIGH (ref 1.7–7.7)
Platelets: 244 10*3/uL (ref 150–400)
RBC: 5.06 MIL/uL (ref 3.87–5.11)
RDW: 21.4 % — AB (ref 11.5–15.5)
WBC Morphology: INCREASED
WBC: 30.5 10*3/uL — AB (ref 4.0–10.5)

## 2018-07-01 LAB — RENAL FUNCTION PANEL
Albumin: 2.4 g/dL — ABNORMAL LOW (ref 3.5–5.0)
Anion gap: 4 — ABNORMAL LOW (ref 5–15)
BUN: 24 mg/dL — ABNORMAL HIGH (ref 8–23)
CALCIUM: 8.1 mg/dL — AB (ref 8.9–10.3)
CO2: 23 mmol/L (ref 22–32)
Chloride: 116 mmol/L — ABNORMAL HIGH (ref 98–111)
Creatinine, Ser: 0.93 mg/dL (ref 0.44–1.00)
GFR calc Af Amer: >60 mL/min (ref >60)
GFR calc non Af Amer: >60 mL/min (ref >60)
Glucose, Bld: 181 mg/dL — ABNORMAL HIGH (ref 70–99)
Phosphorus: 1.3 mg/dL — ABNORMAL LOW (ref 2.5–4.6)
Potassium: 4 mmol/L (ref 3.5–5.1)
SODIUM: 143 mmol/L (ref 135–145)

## 2018-07-01 LAB — HEMOGLOBIN AND HEMATOCRIT, BLOOD
HCT: 33.3 % — ABNORMAL LOW (ref 36.0–46.0)
HCT: 34.7 % — ABNORMAL LOW (ref 36.0–46.0)
HCT: 31 % — ABNORMAL LOW (ref 36.0–46.0)
HCT: 37.3 % (ref 36.0–46.0)
HEMOGLOBIN: 10.7 g/dL — AB (ref 12.0–15.0)
Hemoglobin: 9.1 g/dL — ABNORMAL LOW (ref 12.0–15.0)
Hemoglobin: 9.6 g/dL — ABNORMAL LOW (ref 12.0–15.0)
Hemoglobin: 8.3 g/dL — ABNORMAL LOW (ref 12.0–15.0)

## 2018-07-01 LAB — PREPARE FRESH FROZEN PLASMA: Unit division: 0

## 2018-07-01 LAB — BPAM FFP
BLOOD PRODUCT EXPIRATION DATE: 201907212359
Blood Product Expiration Date: 201907212359
Blood Product Expiration Date: 201907212359
ISSUE DATE / TIME: 201907160415
ISSUE DATE / TIME: 201907160600
ISSUE DATE / TIME: 201907160928
UNIT TYPE AND RH: 5100
UNIT TYPE AND RH: 9500
Unit Type and Rh: 5100

## 2018-07-01 LAB — HEPATIC FUNCTION PANEL
ALK PHOS: 271 U/L — AB (ref 38–126)
ALT: 1725 U/L — ABNORMAL HIGH (ref 0–44)
AST: 1281 U/L — ABNORMAL HIGH (ref 15–41)
Albumin: 2.4 g/dL — ABNORMAL LOW (ref 3.5–5.0)
BILIRUBIN INDIRECT: 2 mg/dL — AB (ref 0.3–0.9)
Bilirubin, Direct: 1.3 mg/dL — ABNORMAL HIGH (ref 0.0–0.2)
Total Bilirubin: 3.3 mg/dL — ABNORMAL HIGH (ref 0.3–1.2)
Total Protein: 4.5 g/dL — ABNORMAL LOW (ref 6.5–8.1)

## 2018-07-01 LAB — COOXEMETRY PANEL
CARBOXYHEMOGLOBIN: 1.3 % (ref 0.5–1.5)
Carboxyhemoglobin: 1.3 % (ref 0.5–1.5)
METHEMOGLOBIN: 1.6 % — AB (ref 0.0–1.5)
METHEMOGLOBIN: 1.4 % (ref 0.0–1.5)
O2 Saturation: 45.3 %
O2 Saturation: 57.9 %
TOTAL HEMOGLOBIN: 10.1 g/dL — AB (ref 12.0–16.0)
TOTAL HEMOGLOBIN: 11 g/dL — AB (ref 12.0–16.0)

## 2018-07-01 LAB — PROTIME-INR
INR: 2.87
Prothrombin Time: 29.9 seconds — ABNORMAL HIGH (ref 11.4–15.2)

## 2018-07-01 LAB — MAGNESIUM: Magnesium: 1.7 mg/dL (ref 1.7–2.4)

## 2018-07-01 MED ORDER — DIGOXIN 0.25 MG/ML IJ SOLN
0.2500 mg | Freq: Once | INTRAMUSCULAR | Status: AC
  Administered 2018-07-01: 0.25 mg via INTRAVENOUS
  Filled 2018-07-01: qty 2

## 2018-07-01 MED ORDER — LEVETIRACETAM 500 MG PO TABS
1000.0000 mg | ORAL_TABLET | Freq: Two times a day (BID) | ORAL | Status: DC
  Administered 2018-07-01: 1000 mg via ORAL
  Filled 2018-07-01: qty 2

## 2018-07-01 MED ORDER — POTASSIUM PHOSPHATES 15 MMOLE/5ML IV SOLN
30.0000 mmol | Freq: Once | INTRAVENOUS | Status: AC
  Administered 2018-07-01: 30 mmol via INTRAVENOUS
  Filled 2018-07-01: qty 10

## 2018-07-01 MED ORDER — DEXTROSE 5 % IV SOLN
INTRAVENOUS | Status: AC
  Administered 2018-07-01 – 2018-07-02 (×2): via INTRAVENOUS

## 2018-07-01 MED ORDER — FUROSEMIDE 10 MG/ML IJ SOLN
40.0000 mg | Freq: Once | INTRAMUSCULAR | Status: AC
  Administered 2018-07-01: 40 mg via INTRAVENOUS
  Filled 2018-07-01: qty 4

## 2018-07-01 MED ORDER — METOPROLOL TARTRATE 5 MG/5ML IV SOLN
5.0000 mg | INTRAVENOUS | Status: DC
  Administered 2018-07-01 – 2018-07-04 (×16): 5 mg via INTRAVENOUS
  Filled 2018-07-01 (×16): qty 5

## 2018-07-01 MED ORDER — DIGOXIN 0.25 MG/ML IJ SOLN
0.2500 mg | Freq: Every day | INTRAMUSCULAR | Status: DC
  Administered 2018-07-02 – 2018-07-04 (×3): 0.25 mg via INTRAVENOUS
  Filled 2018-07-01 (×3): qty 2

## 2018-07-01 MED ORDER — DIGOXIN 0.25 MG/ML IJ SOLN
0.1250 mg | Freq: Once | INTRAMUSCULAR | Status: AC
  Administered 2018-07-02: 0.125 mg via INTRAVENOUS
  Filled 2018-07-01: qty 2

## 2018-07-01 MED ORDER — MAGNESIUM SULFATE 2 GM/50ML IV SOLN
2.0000 g | Freq: Once | INTRAVENOUS | Status: AC
  Administered 2018-07-01: 2 g via INTRAVENOUS
  Filled 2018-07-01: qty 50

## 2018-07-01 MED ORDER — METOPROLOL TARTRATE 5 MG/5ML IV SOLN
5.0000 mg | INTRAVENOUS | Status: DC
  Administered 2018-07-01 (×6): 5 mg via INTRAVENOUS
  Filled 2018-07-01 (×6): qty 5

## 2018-07-01 NOTE — Progress Notes (Signed)
SLP Cancellation Note  Patient Details Name: BLASA RAISCH MRN: [id number] DOB: 1956-04-22   Cancelled treatment:       Reason Eval/Treat Not Completed: Medical issues which prohibited therapy(remains intubated)   Germain Osgood 07/01/2018, 4:01 PM  Germain Osgood, M.A. CCC-SLP [phone]

## 2018-07-01 NOTE — Progress Notes (Signed)
TF restarted at 1330. 30 min later pt found flat in bed, RR up tp 30. Green bile coming out side of mouth and around ETT. TF turned off, pulled up in bed, flipped back to full support on vent. NG tube residuals checked and 0. Pt still w/ no BM. Spoke w/ CCM NP to relay all. Orders to hold TF and get abd xray.

## 2018-07-01 NOTE — Progress Notes (Signed)
Progress Note  Patient Name: Brandi [name] Date of Encounter: 07/01/2018  Primary Cardiologist: Mertie Moores, MD   Subjective   Intubated   Sedated    Inpatient Medications    Scheduled Meds: . chlorhexidine gluconate (MEDLINE KIT)  15 mL Mouth Rinse BID  . Chlorhexidine Gluconate Cloth  6 each Topical Daily  . feeding supplement (VITAL HIGH PROTEIN)  1,000 mL Per Tube Q24H  . fentaNYL (SUBLIMAZE) injection  100 mcg Intravenous Once  . free water  200 mL Per Tube Q8H  . insulin aspart  0-9 Units Subcutaneous Q4H  . lacosamide  100 mg Oral BID  . lactulose  30 g Per Tube BID  . levETIRAcetam  1,000 mg Oral BID  . mouth rinse  15 mL Mouth Rinse 10 times per day  . metoprolol tartrate  5 mg Intravenous Q4H  . vancomycin variable dose per unstable renal function (pharmacist dosing)   Does not apply See admin instructions   Continuous Infusions: . magnesium sulfate 1 - 4 g bolus IVPB 2 g (07/01/18 1048)  . norepinephrine (LEVOPHED) Adult infusion Stopped (06/29/18 1618)  . pantoprozole (PROTONIX) infusion 8 mg/hr (07/01/18 1028)  . piperacillin-tazobactam (ZOSYN)  IV 12.5 mL/hr at 07/01/18 0600  . potassium PHOSPHATE IVPB (in mmol)    . vancomycin Stopped (06/30/18 2204)   PRN Meds: Vital Signs    Vitals:   07/01/18 0930 07/01/18 0945 07/01/18 1000 07/01/18 1015  BP: 107/73  112/86   Pulse: (!) 118 (!) 105 (!) 59 (!) 126  Resp: _0 (!) 22  Temp:      TempSrc:      SpO2: 98% 98% 99% 99%  Weight:      Height:        Intake/Output Summary (Last 24 hours) at 07/01/2018 1057 Last data filed at 07/01/2018 1000 Gross per 24 hour  Intake 4404 ml  Output 1160 ml  Net 3244 ml   Filed Weights   06/29/18 0500 06/30/18 0432 07/01/18 0200  Weight: 73.8 kg (162 lb 11.2 oz) 77.9 kg (171 lb 11.8 oz) 80.1 kg (176 lb 9.4 oz)    Telemetry    Atrial fib   110s to 130   Personally reviewed    ECG      Physical Exam   GEN: Patient intubated   Neck: Neck full      Cardiac: RRR Tachycardic   Respiratory: Coarse BS    GI: Soft, nontender, non-distended  MS: Tr  edema; Feet warm Neuro:  INtubated   Sedated    Labs    Chemistry Recent Labs  Lab 06/29/18 1111  06/30/18 0329 06/30/18 1959 07/01/18 0341  NA 156*   < > 151* 144 143  K 5.3*   < > 2.9* 2.5* 4.0  CL 123*   < > 119* 114* 116*  CO2 12*   < > _1 GLUCOSE 95   < > 154* 168* 181*  BUN 32*   < > 39* 28* 24*  CREATININE 2.14*   < > 1.71* 1.07* 0.93  CALCIUM 9.5   < > 8.6* 8.0* 8.1*  PROT 5.4*  --  4.5*  --  4.5*  ALBUMIN 3.3*  --  2.7*  --  2.4*  2.4*  AST 3,942*  --  3,278*  --  1,281*  ALT 1,976*  --  2,361*  --  1,725*  ALKPHOS 404*  --  318*  --  271*  BILITOT 2.9*  --  2.8*  --  3.3*  GFRNONAA 24*   < > 31* 54* >60  GFRAA 27*   < > 36* >60 >60  ANIONGAP 21*   < > 9 7 4*   < > = values in this interval not displayed.     Hematology Recent Labs  Lab 06/29/18 0755  06/30/18 0329  07/01/18 0214 07/01/18 0341 07/01/18 0837  WBC 31.9*  --  23.6*  --   --  30.5*  --   RBC 4.67  --  4.18  --   --  5.06  --   HGB 8.9*   < > 7.8*   < > 9.1* 9.4* 9.6*  HCT 33.1*   < > 28.3*   < > 33.3* 34.0* 34.7*  MCV 70.9*  --  67.7*  --   --  67.2*  --   MCH 19.1*  --  18.7*  --   --  18.6*  --   MCHC 26.9*  --  27.6*  --   --  27.6*  --   RDW 21.5*  --  20.6*  --   --  21.4*  --   PLT 415*  --  271  --   --  244  --    < > = values in this interval not displayed.    Cardiac Enzymes Recent Labs  Lab 06/29/18 1605 06/29/18 2035 06/30/18 0211  TROPONINI 0.20* 0.28* 0.26*   No results for input(s): TROPIPOC in the last 168 hours.   BNP Recent Labs  Lab 06/29/18 1605  BNP 2,383.4*     DDimer No results for input(s): DDIMER in the last 168 hours.   Radiology    Ct Head Wo Contrast  Result Date: 06/29/2018 CLINICAL DATA:  Altered level of consciousness. Patient on anti coagulation and ventilated. Recent small infarct EXAM: CT HEAD WITHOUT CONTRAST TECHNIQUE:  Contiguous axial images were obtained from the base of the skull through the vertex without intravenous contrast. 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 No hemorrhage. Superficial and central atrophy with chronic small vessel ischemia. No intra-axial mass nor extra-axial fluid collections. Midline fourth ventricle and basal cisterns. Vascular: No hyperdense vessel sign. Skull: Intact Sinuses/Orbits: Bilateral cataract extractions. Intact orbits and globes. No acute sinus disease. Other: Clear mastoids. 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 No acute intracranial hemorrhage or new infarct identified. Atrophy with chronic small vessel ischemia. Electronically Signed   By: Ashley Royalty M.D.   On: 06/29/2018 20:23   Dg Chest Port 1 View  Result Date: 07/01/2018 CLINICAL DATA:  Followup respiratory failure.  Ventilator support. EXAM: PORTABLE CHEST 1 VIEW COMPARISON:  06/30/2018 FINDINGS: Endotracheal tube is 1 cm above the carina. Nasogastric tube enters the stomach. Left internal jugular central line tip is at the SVC RA junction. There is worsened volume loss in the left lower lobe. Mild atelectasis at the right base. IMPRESSION: Worsened volume loss in the left lower lobe. Electronically Signed   By: Nelson Chimes M.D.   On: 07/01/2018 10:31   Dg Chest Port 1 View  Result Date: 06/30/2018 CLINICAL DATA:  62 year old female with liver failure and recent right MCA cerebral infarct. EXAM: PORTABLE CHEST 1 VIEW COMPARISON:  Portable chest 06/29/2018 and earlier. FINDINGS: Portable AP semi upright view at 0413 hours. Endotracheal tube tip has been withdrawn some and projects in good position between the level the clavicles and carina. Enteric tube has  been advanced, and courses to the visible distal stomach with tip not included. Stable left IJ central line. Stable lung volumes and  mediastinal contour. Retrocardiac and patchy left perihilar opacity most resemble atelectasis. No pneumothorax, pulmonary edema, or pleural effusion identified. Negative visible bowel gas pattern. IMPRESSION: 1. Satisfactory ET tube position. Enteric tube has been advanced and could terminates in the distal stomach or proximal duodenum. 2. Stable ventilation with lower lung opacity most resembling atelectasis. Electronically Signed   By: Genevie Ann M.D.   On: 06/30/2018 06:15   Dg Chest Port 1 View  Result Date: 06/29/2018 CLINICAL DATA:  Respiratory failure and status post intubation and central line placement. EXAM: PORTABLE CHEST 1 VIEW COMPARISON:  06/23/2018 FINDINGS: Endotracheal tube present which extends to the carina and may just extend into the right mainstem bronchus. Retraction by a few cm is recommended. Central line via the left jugular vein with the tip in the SVC. No pneumothorax. Lung volumes are relatively low with mild elevation of the right hemidiaphragm. There is no evidence of pulmonary edema, consolidation, nodule or pleural fluid. The heart is mildly enlarged. IMPRESSION: Endotracheal tube extends to the carina and may just extend into the right mainstem bronchus. Recommend retraction by a few cm. Central line tip is in the SVC. No pneumothorax. Electronically Signed   By: Aletta Edouard M.D.   On: 06/29/2018 16:06   Dg Abd Portable 1v  Result Date: 06/29/2018 CLINICAL DATA:  Evaluate NG tube placement EXAM: PORTABLE ABDOMEN - 1 VIEW COMPARISON:  Earlier same day (multiple examinations) FINDINGS: Interval advancement of enteric tube with tip and side port now projected over the gastric antrum. Nonobstructive bowel gas pattern. No supine evidence of pneumoperitoneum. No definite pneumatosis or portal venous gas. Limited visualization of the lower thorax demonstrates tip of a central venous catheter overlying the superior cavoatrial junction. Endotracheal tube overlies the tracheal air  column with tip superior to the carina. Additional linear radiopaque structure overlying the medial aspect the right lung apex may be external to the patient. Post cholecystectomy. No acute osseus abnormalities. IMPRESSION: Interval advancement of enteric tube with tip and side port projected over the gastric antrum. Electronically Signed   By: Sandi Mariscal M.D.   On: 06/29/2018 18:08   Dg Abd Portable 1v  Result Date: 06/29/2018 CLINICAL DATA:  Evaluate OG tube placement EXAM: PORTABLE ABDOMEN - 1 VIEW COMPARISON:  Earlier same day FINDINGS: Interval advancement of enteric tube with tip and side port now projected over the gastric fundus. Nonobstructive bowel gas pattern. No supine evidence of pneumoperitoneum. No definite pneumatosis or portal venous gas. Limited visualization of the lower thorax demonstrates tip of a central venous catheter overlying the superior cavoatrial junction. Post cholecystectomy. No acute osseus abnormalities. IMPRESSION: Interval advancement of enteric tube with tip and side port projected over the gastric fundus. Electronically Signed   By: Sandi Mariscal M.D.   On: 06/29/2018 18:07   Dg Abd Portable 1v  Result Date: 06/29/2018 CLINICAL DATA:  NG tube placement EXAM: PORTABLE ABDOMEN - 1 VIEW COMPARISON:  None. FINDINGS: NG tube with tip in the proximal stomach. Side port is approximately 2 cm above the GE junction. Consider advancement by 5-7 cm. IMPRESSION: NG tube with tip in the stomach and side port above the GE junction Electronically Signed   By: Suzy Bouchard M.D.   On: 06/29/2018 16:42   US Liver Doppler  Result Date: 06/29/2018 CLINICAL DATA:  Nodular hepatic contour. Evaluate hepatic vasculature. EXAM: DUPLEX  ULTRASOUND OF LIVER TECHNIQUE: Color and duplex Doppler ultrasound was performed to evaluate the hepatic in-flow and out-flow vessels. COMPARISON:  CT abdomen pelvis-06/23/2018; 06/16/2018 FINDINGS: Liver: There is diffuse increased slightly coarsened  echogenicity of the hepatic parenchyma with mild nodularity of the hepatic contour as demonstrated on preceding abdominal CT. No discrete hepatic lesion. No definite intrahepatic biliary duct dilatation. No ascites. Portal Vein Velocities Main:  47 cm/sec Right: Not visualized (note, the right portal vein appears widely patent on contrast-enhanced abdominal CT performed 06/16/2018.) Left:  64 cm/sec Normal directional flow was demonstrated throughout the interrogated portions of the portal venous system Hepatic Vein Velocities Right:  92 cm/sec Middle:  79 cm/sec Left:  44 cm/sec Pulsatile flow was seen within the hepatic venous system. IVC: Present and patent with normal respiratory phasicity. Hepatic Artery Velocity:  119 cm/sec Splenic Vein Velocity:  17.5 cm/sec Varices: None visualized Ascites: None visualized Spleen is normal in size measuring 9.7 x 7.1 x 3.1 cm with calculated volume of 112 cc. IMPRESSION: 1. Normal directional flow and velocities are demonstrated throughout the interrogated portions of the portal venous system. Note, the right portal vein was suboptimally evaluated sonographically though appeared widely patent on recent contrast-enhanced abdominal CT performed 06/16/2018. 2. Cirrhotic appearing liver without evidence of portal venous hypertension, specifically, no evidence splenomegaly or ascites. 3. Pulsatile flow within the hepatic venous system - nonspecific though could be seen in the setting of right-sided heart failure. Clinical correlation is advised. Electronically Signed   By: Sandi Mariscal M.D.   On: 06/29/2018 17:47      Assessment & Plan    1   CHF  Pt remains tachycardic     I/O + 12 L   CVP 14   Would repeat COOX now that PICC line is function     COntinue to follow CVP and output    2    Rhythm    Afib today   Rates still elevated   BP is marginal   Would add digoxin  0.25 the 0.25   IV   Follow on 0.25 daily dose INR now in high 2s    3   Renal  Function remains  good   4  GI   LFTs continue to improve   INR 2.9        Dorris Carnes         For questions or updates, please contact [facility] Please consult [email or web address] for contact info under Cardiology/STEMI.      Signed, Dorris Carnes, MD  07/01/2018, 10:57 AM

## 2018-07-01 NOTE — Progress Notes (Signed)
PCCM INTERVAL PROGRESS NOTE   Patient did not tolerate tube feeding. Vomited. KUB ordered. Largely distended bowel loops. Will give her a some time with OGT to continuous suction and then return to intermittent. NPO except meds.    Georgann Housekeeper, AGACNP-BC [facility] Pulmonology/Critical Care Pager [phone] or [phone]  07/01/2018 5:17 PM

## 2018-07-01 NOTE — Progress Notes (Signed)
Nutrition Follow-up  DOCUMENTATION CODES:   Obesity unspecified  INTERVENTION:   Tube Feeding:  Begin Vital High Protein @ 20 ml/hr; if tolerating, to goal starting tomorrow Goal rate:  Vital High Protein @ 45 ml/hr Pro-Stat 30 mL daily Provides 1180 kcals, 110 g of protein and 875 mL of free water  NUTRITION DIAGNOSIS:   Inadequate oral intake related to acute illness as evidenced by NPO status.  Being addressed via TF   GOAL:   Patient will meet greater than or equal to 90% of their needs  Progressing  MONITOR:   Vent status, TF tolerance, Labs, Weight trends  REASON FOR ASSESSMENT:   Consult, Ventilator Enteral/tube feeding initiation and management  ASSESSMENT:   62 yo female admitted 7/9 with weakness, anorexia and lethargy with NASH cirrhosis with hepatic encephalopathy, new onset Afib with RVR, new onset HFrEF 30-35%, anemia. On 7/15,  pt's condition worsened with increased confusion/lethargy, tardive dyskinesia symptoms, AKI, shock liver and respiratory failure requiring intubation.  Pt with hx of HTN, HLD, seizure disorder, left breast cancer s/p lumpectomy 2008, GERD, depression, DM  Patient is currently intubated on ventilator support MV: 11 L/min Temp (24hrs), Avg:98.4 F (36.9 C), Min:97.6 F (36.4 C), Max:99.2 F (37.3 C)  Discussed poc with Eddie Dibbles NP with CCM. Plan to begin TF today as ordered, Vital High Protein at 20 ml/hr  No signs of active GI bleeding, Hgb trending up slightly, bilious return from OG tube  Family reports height of 5 feet; estimated "dry weight" of 72 kg. BMI 31.1  Labs: phosphous 1.3 (L), Creatinine wdl Meds:  lactulose   Diet Order:   Diet Order           Diet NPO time specified  Diet effective now          EDUCATION NEEDS:   Education needs have been addressed  Skin:  Skin Assessment: Reviewed RN Assessment  Last BM:  7/14  Height:   Ht Readings from Last 1 Encounters:  07/01/18 5' (1.524 m)    Weight:    Wt Readings from Last 1 Encounters:  07/01/18 176 lb 9.4 oz (80.1 kg)    Ideal Body Weight:  50 kg  BMI:  Body mass index is 34.49 kg/m.  Estimated Nutritional Needs:   Kcal:  1000-1200 kcals; 1151 kcals (70% [employer])  Protein:  100-115 g  Fluid:  >/= 1.4 L   Kerman Passey MS, RD, LDN, CNSC [phone] Pager  [phone] Weekend/On-Call Pager

## 2018-07-01 NOTE — Progress Notes (Signed)
PULMONARY / CRITICAL CARE MEDICINE   Name: Brandi [name] MRN: [id number] DOB: 04/22/1956    ADMISSION DATE:  06/23/2018 CONSULTATION DATE: 06/29/2018  REFERRING MD: Dr. Bonner Puna  CHIEF COMPLAINT: Altered mental status  HISTORY OF PRESENT ILLNESS:  62 y/o F who presented to [facility] on 7/9 with abdominal pain, nausea, vomiting, decreased oral intake and weakness.    She was reportedly evaluated by her PCP prior to admit with elevated LFT's (AST 302, ALT 611, AP 380).  Follow up CT of the abdomen demonstrated prominent caudate lobe and surface irregularity of the liver.   In the ER, US of the abdomen confirmed the CT findings of the liver.  She was noted to be lethargic and have asterixis on exam.  Ammonia 22 (7/10) > 38 (7/12).  The patient had AFwRVR and was treated with cardizem.  She was admitted for further evaluation.  The patient was seen by Cardiology and transitioned to metoprolol for rate control due to decreased LVEF (30-35%). Labs showed a microcytic anemia with ferritin of 9.  GI was consulted for evaluation with recommendations for lactulose and eventual endoscopic evaluation.  She had ongoing encephalopathy and a CT of the head was obtained which showed a right frontal operculum CVA.  Neurology evaluated the patient and felt the stroke was likely related to AF but not the cause of her mental status.  She was recommended anticoagulation and passed a swallow evaluation converting from heparin to eliquis.  On 7/15, her confusion, tardive dyskinesia symptoms and lethargy worsened as well as her LFT's (AST 3942, ALT 1976, ALK Phos 404), renal function and WBC.  PCCM consulted for evaluation.    SUBJECTIVE:  Agitation better controlled today. Weaning well 10/5  VITAL SIGNS: BP 113/89   Pulse (!) 119   Temp 98.1 F (36.7 C) (Oral)   Resp 17   Ht _0  (1.575 m)   Wt 80.1 kg (176 lb 9.4 oz)   SpO2 100%   BMI 32.30 kg/m   HEMODYNAMICS: CVP:  [13 mmHg] 13 mmHg  VENTILATOR  SETTINGS: Vent Mode: CPAP;PSV FiO2 (%):  [40 %] 40 % Set Rate:  [18 bmp] 18 bmp Vt Set:  [400 mL] 400 mL PEEP:  [5 cmH20] 5 cmH20 Pressure Support:  [10 cmH20] 10 cmH20 Plateau Pressure:  [16 cmH20-17 cmH20] 16 cmH20  INTAKE / OUTPUT: I/O last 3 completed shifts: In: 7818.6 [I.V.:5112.2; Blood:972.7; Other:120; NG/GT:950; IV Piggyback:663.7] Out: 1324 [Urine:1165; Emesis/NG output:100]  PHYSICAL EXAMINATION: General:  Critically ill female lying in bed initially severely agitated, resolved after sedation and return to full MV support  HEENT: MM pink/moist/ ETT/ OGT,  Neuro: does not open eyes, MAE- non purposeful, not f/c CV: IRIR, +1 pulses PULM: tachypneic/agitation on PSV, placed back on full MV, improved tachypnea, lungs bilaterally coarse GI: obese, NT, hyperBS Extremities: cool/moist, +1 generalized edema Skin: no rashes, no sites of bleeding noted  LABS:  BMET Recent Labs  Lab 06/30/18 0329 06/30/18 1959 07/01/18 0341  NA 151* 144 143  K 2.9* 2.5* 4.0  CL 119* 114* 116*  CO2 _1 BUN 39* 28* 24*  CREATININE 1.71* 1.07* 0.93  GLUCOSE 154* 168* 181*    Electrolytes Recent Labs  Lab 06/29/18 1605 06/30/18 0329 06/30/18 1959 07/01/18 0341  CALCIUM 9.3 8.6* 8.0* 8.1*  MG 2.1  --   --  1.7  PHOS  --   --   --  1.3*    CBC Recent Labs  Lab 06/29/18 0755  06/30/18 0329  07/01/18 0214 07/01/18 0341 07/01/18 0837  WBC 31.9*  --  23.6*  --   --  30.5*  --   HGB 8.9*   < > 7.8*   < > 9.1* 9.4* 9.6*  HCT 33.1*   < > 28.3*   < > 33.3* 34.0* 34.7*  PLT 415*  --  271  --   --  244  --    < > = values in this interval not displayed.    Coag's Recent Labs  Lab 06/29/18 2302 06/30/18 1331 07/01/18 0341  INR 7.88* 3.37 2.87    Sepsis Markers Recent Labs  Lab 06/29/18 1244 06/29/18 1605 06/29/18 2035 06/30/18 1959  LATICACIDVEN 10.8* 8.3* 4.6*  --   PROCALCITON  --   --   --  0.31    ABG Recent Labs  Lab 06/29/18 1606 06/30/18 0510   PHART 7.263* 7.471*  PCO2ART 35.2 32.2  PO2ART 333.0* 98.1    Liver Enzymes Recent Labs  Lab 06/29/18 1111 06/30/18 0329 07/01/18 0341  AST 3,942* 3,278* 1,281*  ALT 1,976* 2,361* 1,725*  ALKPHOS 404* 318* 271*  BILITOT 2.9* 2.8* 3.3*  ALBUMIN 3.3* 2.7* 2.4*  2.4*    Cardiac Enzymes Recent Labs  Lab 06/29/18 1605 06/29/18 2035 06/30/18 0211  TROPONINI 0.20* 0.28* 0.26*    Glucose Recent Labs  Lab 06/30/18 1143 06/30/18 1629 06/30/18 1931 06/30/18 2334 07/01/18 0328 07/01/18 0738  GLUCAP 159* 182* 176* 219* 167* 173*    Imaging  STUDIES:  CT Head 7/9 >> atrophy with small vessel chronic ischemic changes of deep cerebral white matter CT Renal Study 7/9 >> no acute findings, bilateral non-obstructing renal stones ECHO 7/9 >> LV mildly dilated, wall thickness was increased in a pattern of moderate LVH, LVEF 30-35%, severe hypokinesis of the anteroseptal, anterior, inferoseptal & apical myocardium, mild to moderate MV regurgitation, LA severely dilated, RV moderately dilated, RA mod-severely dilated, PA pressure 15mHg, trivial pericardial effusion. CTA Head, Neck 7/12 >> minimal calcified plaque at the carotid bifurcation regions but no stenosis or irregularity, no large vessel occlusions, unable to specifically identify the missing branch vessel responsible for the right posterior frontal infarction but this is presumably an occluded M3 or distal branch CT Head w/o 7/12 >> the small right middle cerebral artery distribution, posterolateral frontal lobe infarct has evolved since the prior CT, it is now seen as a well defined area of hypoattenuation, no new areas of infarct, no ICH CT head 7/15 >> Redemonstration of right posterior frontal lobe infarct. No acute intracranial hemorrhage or new infarct identified.  Atrophy with chronic small vessel ischemia.  CULTURES: BCx2 7/10 >> negative   ANTIBIOTICS: Vanco 7/15 >>  Zoysn 7/15 >>   SIGNIFICANT EVENTS: 7/09   Admit  7/15  PCCM consulted with AMS, acute liver failure  LINES/TUBES: ETT 7/15 >>  DISCUSSION: 62y/o F admitted with weakness, decreased intake and lethargy.  Work up concerning for NASH, [place]  She developed AMS > small infarct thought not related to AMS.  Recommended for anticoagulation by Cardiology/Neurology, was transitioned from heparin to eliquis.  The patient deteriorated on 7/15 with a rise in LFT's, AKI, confusion/AMS, worsening of tardive dyskinesia type symptoms.  Transferred to ICU.    ASSESSMENT / PLAN:  PULMONARY A: Acute Respiratory Insufficiency - in setting of liver failure  Bilateral Pleural Effusions - suspect in setting of liver disease, HF, AKI P:   Weaning 5/5 and tol well.  VAP bundle Mental status  is barrier to further weaning.   CARDIOVASCULAR A:  HFrEF A. fib with RVR HTN - mildly elevated troponin's; EKG unchanged  P:  Continue tele monitoring Cardiology following, appreciate assistance Lopressor IV started last night. Remains tachycardic.  Hold further anticoagulation in setting of hepatic failure coagulopathy.   RENAL A:   AKI - in setting of liver injury, NASH Hypernatremia Hypokalemia  P:   Appreciate Renal assistance Free water 242m q 8 hr Replace mag, Phos today.  Trend BMP / mag/ phos/ daily/ urinary output  GASTROINTESTINAL A:   Nash Cirrhosis with Hepatic Encephalopathy Hepatic dysfunction/ shock  Suspected UGIB GERD - unclear in what precipitated change in events LFTs/ decompensation 7/15 - 7/15 [place] w/doppler with no evidence of portal venous thrombosis   P:   Appreciate GI input Restart TF today.  Continue lactulose BID Trend ammonia Trend LFts and coags May need further bowel regimen if TF restart does not trigger BM.   HEMATOLOGIC A:   Iron Deficiency Anemia  Bleeding risk  Hx Breast Cancer - s/p 3 units FFP 7/16 am P:  Follow INR (today 2.87 therapeutic for AF will leave alone) Follow CBC/coags  daily Transfuse for hgb < 7 Monitor for bleeding SCDs only    INFECTIOUS A:   Leukocytosis Rule Out Sepsis - SBP?, other etiology  - remains afebrile P:   Follow cultures Continue empiric vanc / zosyn for now Ideally would dc vanco, but WBC remain 30 and have not improved. Will re-eval this for tomorrow.  Trend WBC/ fever curve   ENDOCRINE A:   DM 2 Hypoglycemia  P:   CBG q 4 SSI  NEUROLOGIC A:   Acute metabolic encephalopathy Acute delirium Right Frontal Operculum infarct - felt related to AF/embolic but not all cause of mental status Right-sided weakness Seizure disorder  Schizophrenia  P: RASS goal: 0 to -1  Continue PRN fentanyl and versed given hepatic failure Ongoing neuro assessments Correct metabolic derangements as above  Hold Buspar, Effexor > restart once acute phase illness resolved as can withdrawal Restart Keppra now that Na improved.   FAMILY  - Updates: Husband and daughter updated at bedside 7/17.  Brought her living will which will be placed on chart which coincides with earlier wishes to continue to treat reversible processes but would not want prolonged support.    PGeorgann Housekeeper AGACNP-BC LMercy Tiffin HospitalPulmonology/Critical Care Pager [phone] 07/01/2018 10:48 AM

## 2018-07-02 ENCOUNTER — Inpatient Hospital Stay (HOSPITAL_COMMUNITY): Payer: Medicare Other

## 2018-07-02 LAB — COMPREHENSIVE METABOLIC PANEL
ALK PHOS: 256 U/L — AB (ref 38–126)
ALT: 1311 U/L — AB (ref 0–44)
AST: 493 U/L — AB (ref 15–41)
Albumin: 2.1 g/dL — ABNORMAL LOW (ref 3.5–5.0)
Anion gap: 8 (ref 5–15)
BUN: 22 mg/dL (ref 8–23)
CALCIUM: 7.7 mg/dL — AB (ref 8.9–10.3)
CO2: 21 mmol/L — AB (ref 22–32)
CREATININE: 0.97 mg/dL (ref 0.44–1.00)
Chloride: 109 mmol/L (ref 98–111)
GFR calc Af Amer: >60 mL/min (ref >60)
GFR calc non Af Amer: >60 mL/min (ref >60)
GLUCOSE: 173 mg/dL — AB (ref 70–99)
Potassium: 3.5 mmol/L (ref 3.5–5.1)
SODIUM: 138 mmol/L (ref 135–145)
Total Bilirubin: 3.4 mg/dL — ABNORMAL HIGH (ref 0.3–1.2)
Total Protein: 4 g/dL — ABNORMAL LOW (ref 6.5–8.1)

## 2018-07-02 LAB — COOXEMETRY PANEL
Carboxyhemoglobin: 1.7 % — ABNORMAL HIGH (ref 0.5–1.5)
Methemoglobin: 1 % (ref 0.0–1.5)
O2 Saturation: 48.8 %
Total hemoglobin: 10.8 g/dL — ABNORMAL LOW (ref 12.0–16.0)

## 2018-07-02 LAB — GLUCOSE, CAPILLARY
GLUCOSE-CAPILLARY: 186 mg/dL — AB (ref 70–99)
GLUCOSE-CAPILLARY: 162 mg/dL — AB (ref 70–99)
GLUCOSE-CAPILLARY: 157 mg/dL — AB (ref 70–99)
Glucose-Capillary: 124 mg/dL — ABNORMAL HIGH (ref 70–99)
Glucose-Capillary: 130 mg/dL — ABNORMAL HIGH (ref 70–99)
Glucose-Capillary: 143 mg/dL — ABNORMAL HIGH (ref 70–99)

## 2018-07-02 LAB — CBC WITH DIFFERENTIAL/PLATELET
BASOS ABS: 0 10*3/uL (ref 0.0–0.1)
Basophils Relative: 0 %
EOS ABS: 0 10*3/uL (ref 0.0–0.7)
EOS PCT: 0 %
HCT: 37.9 % (ref 36.0–46.0)
HEMOGLOBIN: 10.6 g/dL — AB (ref 12.0–15.0)
Lymphocytes Relative: 2 %
Lymphs Abs: 0.9 10*3/uL (ref 0.7–4.0)
MCH: 18.2 pg — AB (ref 26.0–34.0)
MCHC: 28 g/dL — ABNORMAL LOW (ref 30.0–36.0)
MCV: 65.2 fL — ABNORMAL LOW (ref 78.0–100.0)
Monocytes Absolute: 2.8 10*3/uL — ABNORMAL HIGH (ref 0.1–1.0)
Monocytes Relative: 6 %
Neutro Abs: 42.3 10*3/uL — ABNORMAL HIGH (ref 1.7–7.7)
Neutrophils Relative %: 92 %
Platelets: 197 10*3/uL (ref 150–400)
RBC: 5.81 MIL/uL — AB (ref 3.87–5.11)
RDW: 21.6 % — ABNORMAL HIGH (ref 11.5–15.5)
WBC MORPHOLOGY: INCREASED
WBC: 46 10*3/uL — ABNORMAL HIGH (ref 4.0–10.5)

## 2018-07-02 LAB — HEMOGLOBIN AND HEMATOCRIT, BLOOD
HCT: 35.6 % — ABNORMAL LOW (ref 36.0–46.0)
HCT: 40.7 % (ref 36.0–46.0)
HEMATOCRIT: 40 % (ref 36.0–46.0)
HEMATOCRIT: 36.6 % (ref 36.0–46.0)
HEMOGLOBIN: 10.7 g/dL — AB (ref 12.0–15.0)
Hemoglobin: 10.4 g/dL — ABNORMAL LOW (ref 12.0–15.0)
Hemoglobin: 11.3 g/dL — ABNORMAL LOW (ref 12.0–15.0)
Hemoglobin: 11.3 g/dL — ABNORMAL LOW (ref 12.0–15.0)

## 2018-07-02 LAB — DIGOXIN LEVEL: DIGOXIN LVL: 1.2 ng/mL (ref 0.8–2.0)

## 2018-07-02 LAB — AMMONIA: Ammonia: 49 umol/L — ABNORMAL HIGH (ref 9–35)

## 2018-07-02 MED ORDER — LEVETIRACETAM IN NACL 1000 MG/100ML IV SOLN
1000.0000 mg | Freq: Two times a day (BID) | INTRAVENOUS | Status: DC
  Administered 2018-07-02 – 2018-07-23 (×43): 1000 mg via INTRAVENOUS
  Filled 2018-07-02 (×46): qty 100

## 2018-07-02 MED ORDER — IOPAMIDOL (ISOVUE-300) INJECTION 61%
INTRAVENOUS | Status: AC
  Filled 2018-07-02: qty 30

## 2018-07-02 MED ORDER — SODIUM CHLORIDE 0.9 % IV SOLN
100.0000 mg | Freq: Two times a day (BID) | INTRAVENOUS | Status: DC
  Administered 2018-07-02 – 2018-07-23 (×43): 100 mg via INTRAVENOUS
  Filled 2018-07-02 (×53): qty 10

## 2018-07-02 MED ORDER — LACTULOSE ENEMA
300.0000 mL | Freq: Two times a day (BID) | RECTAL | Status: DC
Start: 2018-07-02 — End: 2018-07-04
  Administered 2018-07-02: 300 mL via RECTAL
  Filled 2018-07-02 (×5): qty 300

## 2018-07-02 NOTE — Consult Note (Addendum)
Reason for Consult: SBO versus ileus Referring Physician: Georgann Housekeeper, NP Chief complaint, nausea, poor oral intake and palpitations  Now with large and small bowel distention, large stool burden  Brandi [name] is an 62 y.o. female.   HPI: Patient is a 62 year old female with a history of hypertension hyperlipidemia major depression, schizophrenia, history of seizures, left breast cancer with prior lumpectomy, presented with generalized weakness and nausea to the ED on 06/20/18.  She was being evaluated by Dr. Redmond School at the same time and had a CT on 06/16/2018 that showed cardiomegaly with small pleural effusions and mild atelectasis, possible cirrhosis, and bilateral renal calculi.  She was discharged home at that time with follow-up by Dr. Redmond School.  He noted prior elevated enzymes hepatitis screens were negative.      She presented to the ED later that evening on 06/22/18.  At that time she was found to be in atrial fibrillation with RVR and was placed on Cardizem drip.   Patient was admitted to the stepdown unit and placed on a Cardizem drip.  She was seen by cardiology for her atrial fibrillation with rates up into the 160s.  She converted back to sinus rhythm on a Cardizem drip.  It was his opinion that her atrial fibrillation was probably secondary to her acute GI illness which at that time was thought to be possible cirrhosis.  She met the CHADS criteria for anticoagulation, but with her abnormal LFTs and mild anemia it was opted to hold off on anticoagulation.  Echocardiogram was completed on 06/23/2017 which showed systolic function moderately to severely reduced EF of 30 to 35%.  Severe hypokinesis of the anterior septal, anterior, inferior septal and apical myocardium.  Mild to moderate MR.  Right ventricle was also dilated luminary pressures were 45 mmHg.  There is also a trivial pericardial effusion.  Patient started to show signs of an acute metabolic encephalopathy on 06/24/2018 although she  was still talking and says she is able to breathe okay at that time.  MRI at that time showed an acute right frontal operculum infarct.  She was also seen by Dr. Carol Ada on 06/25/2018 with a diagnosis of nausea and vomiting, abdominal pain, and cirrhosis in the setting of metabolic with hepatic encephalopathy he was concerned about an ischemic liver.  Because of her stroke she was transferred to [facility].  She was seen by Dr.Sushanth Aroor, Neurology on 06/25/2018, with a diagnosis of acute hepatic encephalopathy, and left frontal lobe embolic infarct.  Plans at that time were to treat the patient with lactulose, continue to treat her with Keppra and Vimpat. She was on lactulose enemas initially, until she was cleared for p.o.'s.  She was making some she was progressing with therapies and was converted over to Eliquis for anticoagulation.    On 06/29/2018 she had acute decline in her mental status.  She developed severe hypernatremia, acute kidney injury, and a significant elevation in her LFTs, possible GI bleed.  She was seen by CCM and transferred to the ICU.  She required acute intubation at that time. LFTs went up significantly on 06/29/2018 also On 06/29/2018 bilirubin went up to 2.9, ALT 1976, AST 3942, alk phos was stable at 404.  She also had severe hyponatremia with a sodium going up to 157.  INR went up to 8.74, lactate 8.3, troponin 0.2 Dr. Benson Norway saw her again on 7/16 and notes that her ultrasound did not show any evidence of  portal hypertension there is evidence of right congestive heart failure there is no overt evidence of GI bleed.  He recommended ongoing supportive care.   An NG was placed on 7/15 as confirmed by x-ray.  Attempts to feed her via the NG tube was started and she developed vomiting.  Plain film at that time showed increased dilated loops of proximal small bowel consistent with an obstruction and there is extensive stool present in the ascending  colon and some gas in the descending colon.  The NG was in place.  They withheld the tube feedings, attempted to disimpact her and treated her with an enema.  They attempted trickle tube feeds again today which led to more vomiting today  Repeat film today shows persistent dilatation of the small and large bowel with large amount of colonic stool they cannot tell whether this was a mechanical obstruction or colonic pseudoobstruction.  Patient has some about 14 cm of colonic dilatation on the film with large volume of stool.  A CT of the abdomen and pelvis have been ordered but has not been completed at this time.  Tube feeding was discontinued she has been placed on suction; we were asked to see.  She is nonresponsive but does not appear to have an acute abdomen.  A CT scan has been ordered.  Dr. Donne Hazel is on-call tonight and aware.  He will see and review as soon is the CT is completed.  NG is currently working.  Marland Kitchen dextrose 100 mL/hr at 07/02/18 1700  . lacosamide (VIMPAT) IV Stopped (07/02/18 1435)  . levETIRAcetam Stopped (07/02/18 1422)  . norepinephrine (LEVOPHED) Adult infusion Stopped (06/29/18 1618)  . piperacillin-tazobactam (ZOSYN)  IV 12.5 mL/hr at 07/02/18 1700  . vancomycin Stopped (07/01/18 2311)    Past Medical History:  Diagnosis Date  . Allergic rhinitis   . Anemia   . Arthritis HX RIGHT ANKLE FX  . Cancer [facility])    breast, lumpectomy  . Depression    MAJOR  . Dyslipidemia   . GERD (gastroesophageal reflux disease)   . History of breast cancer DX 2010--  S/P LUMPECTOMY AND RADIATION -- NO RECURRENCE   ONCOLOGIST- DR Truddie Coco  . Hypertension   . Left flank pain   . Left ureteral calculus   . Migraines   . Schizophrenic disorder ([place])   . 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 Type II or unspecified type diabetes mellitus without mention of complication, not stated as uncontrolled 03/16/2014    Past Surgical History:  Procedure Laterality  Date  . BREAST SURGERY  01-04-2009  DR Margot Chimes   LEFT BREAST LUMPECTOMY W/ SLN DISSECTION  FOR CANCER  . CATARACT EXTRACTION, BILATERAL Bilateral   . CESAREAN SECTION  1998  . CHILD BIRTH     C-SECTION  . CYSTOSCOPY WITH RETROGRADE PYELOGRAM, URETEROSCOPY AND STENT PLACEMENT  11/10/2012   Procedure: CYSTOSCOPY WITH RETROGRADE PYELOGRAM, URETEROSCOPY AND STENT PLACEMENT;  Surgeon: Hanley Ben, MD;  Location: [facility];  Service: Urology;  Laterality: Left;  WITH DOUBLE J STENT  . HOLMIUM LASER APPLICATION  54/27/0623   Procedure: HOLMIUM LASER APPLICATION;  Surgeon: Hanley Ben, MD;  Location: [facility];  Service: Urology;  Laterality: Left;  . LAPAROSCOPIC CHOLECYSTECTOMY  1990'S    Family History  Problem Relation Age of Onset  . Cancer Sister        breast  . Cancer Mother        breast  .  Cancer Father        lung  . Mental illness Maternal Uncle   . Cancer Maternal Grandmother   . Stroke Maternal Grandmother   . Tuberculosis Maternal Grandfather   . Cancer Paternal Grandmother   . Seizures Neg Hx     Social History:  reports that she has never smoked. She has never used smokeless tobacco. She reports that she does not drink alcohol or use drugs.  Allergies:  Allergies  Allergen Reactions  . Penicillins Rash    Has patient had a PCN reaction causing immediate rash, facial/tongue/throat swelling, SOB or lightheadedness with hypotension: No Has patient had a PCN reaction causing severe rash involving mucus membranes or skin necrosis: No Has patient had a PCN reaction that required hospitalization No Has patient had a PCN reaction occurring within the last 10 years: No If all of the above answers are "NO", then may proceed with Cephalosporin use.     Medications:  Prior to Admission:  Medications Prior to Admission  Medication Sig Dispense Refill Last Dose  . aspirin EC 81 MG EC tablet Take 1 tablet (81 mg total) by mouth daily.  30 tablet 0 Past Week at Unknown time  . atorvastatin (LIPITOR) 20 MG tablet Take 1 tablet (20 mg total) by mouth daily. 90 tablet 3 Past Week at Unknown time  . busPIRone (BUSPAR) 30 MG tablet Take 15-30 mg by mouth See admin instructions. 15 mg every morning, 30 mg every evening   Past Week at Unknown time  . calcium-vitamin D (OSCAL WITH D) 250-125 MG-UNIT per tablet Take 2 tablets by mouth daily. 30 tablet 11 Past Week at Unknown time  . dicyclomine (BENTYL) 20 MG tablet Take 1 tablet (20 mg total) by mouth every 8 (eight) hours as needed for spasms. 20 tablet 0 Past Week at Unknown time  . LATUDA 120 MG TABS Take 120 mg by mouth daily.   3 Past Week at Unknown time  . levETIRAcetam (KEPPRA) 500 MG tablet TAKE 2 TABLETS (1,000 MG TOTAL) BY MOUTH 2 (TWO) TIMES DAILY. 360 tablet 3 Past Week at Unknown time  . lisinopril (PRINIVIL,ZESTRIL) 5 MG tablet TAKE 1 TABLET (5 MG TOTAL) BY MOUTH DAILY. 90 tablet 3 Past Week at Unknown time  . LORazepam (ATIVAN) 0.5 MG tablet Take 0.5-1 mg by mouth 2 (two) times daily. 1 tablet every morning, and 2 tablets at bedtime as needed for anxiety   Past Week at Unknown time  . omeprazole (PRILOSEC) 20 MG capsule Take 1 capsule (20 mg total) by mouth daily. 90 capsule 1 Past Week at Unknown time  . ondansetron (ZOFRAN ODT) 4 MG disintegrating tablet Take 1 tablet (4 mg total) by mouth every 8 (eight) hours as needed for nausea or vomiting. 20 tablet 0 06/22/2018 at Unknown time  . ondansetron (ZOFRAN) 4 MG tablet Take 1 tablet (4 mg total) by mouth every 8 (eight) hours as needed for nausea or vomiting. 12 tablet 0 Past Week at Unknown time  . venlafaxine XR (EFFEXOR-XR) 150 MG 24 hr capsule Take 300 mg by mouth daily.   11 Past Week at Unknown time  . VIMPAT 100 MG TABS TAKE 1 TABLET BY MOUTH TWICE A DAY 180 tablet 1 Past Week at Unknown time  . ONETOUCH DELICA LANCETS FINE MISC DX: E11.9  Patient is to test one time a day 100 each 3 Taking   Scheduled: .  chlorhexidine gluconate (MEDLINE KIT)  15 mL Mouth Rinse BID  .  Chlorhexidine Gluconate Cloth  6 each Topical Daily  . digoxin  0.25 mg Intravenous Daily  . feeding supplement (VITAL HIGH PROTEIN)  1,000 mL Per Tube Q24H  . fentaNYL (SUBLIMAZE) injection  100 mcg Intravenous Once  . free water  200 mL Per Tube Q8H  . insulin aspart  0-9 Units Subcutaneous Q4H  . lactulose  300 mL Rectal BID  . mouth rinse  15 mL Mouth Rinse 10 times per day  . metoprolol tartrate  5 mg Intravenous Q3H  . vancomycin variable dose per unstable renal function (pharmacist dosing)   Does not apply See admin instructions   Continuous: . dextrose 100 mL/hr at 07/02/18 1700  . lacosamide (VIMPAT) IV Stopped (07/02/18 1435)  . levETIRAcetam Stopped (07/02/18 1422)  . norepinephrine (LEVOPHED) Adult infusion Stopped (06/29/18 1618)  . piperacillin-tazobactam (ZOSYN)  IV 12.5 mL/hr at 07/02/18 1700  . vancomycin Stopped (07/01/18 2311)   Anti-infectives (From admission, onward)   Start     Dose/Rate Route Frequency Ordered Stop   06/30/18 2200  vancomycin (VANCOCIN) IVPB 1000 mg/200 mL premix     1,000 mg 200 mL/hr over 60 Minutes Intravenous Every 24 hours 06/30/18 2008     06/30/18 2200  piperacillin-tazobactam (ZOSYN) IVPB 3.375 g     3.375 g 12.5 mL/hr over 240 Minutes Intravenous Every 8 hours 06/30/18 2011     06/30/18 0000  vancomycin variable dose per unstable renal function (pharmacist dosing)      Does not apply See admin instructions 06/29/18 1229     06/29/18 1300  piperacillin-tazobactam (ZOSYN) IVPB 2.25 g  Status:  Discontinued     2.25 g 100 mL/hr over 30 Minutes Intravenous Every 8 hours 06/29/18 1229 06/30/18 2011   06/29/18 1300  vancomycin (VANCOCIN) 1,250 mg in sodium chloride 0.9 % 250 mL IVPB     1,250 mg 166.7 mL/hr over 90 Minutes Intravenous  Once 06/29/18 1229 06/29/18 2234      Results for orders placed or performed during the hospital encounter of 06/23/18 (from the past 48  hour(s))  Glucose, capillary     Status: Abnormal   Collection Time: 06/30/18  4:29 PM  Result Value Ref Range   Glucose-Capillary 182 (H) 70 - 99 mg/dL  Vancomycin, random     Status: None   Collection Time: 06/30/18  5:03 PM  Result Value Ref Range   Vancomycin Rm 6     Comment:        Random Vancomycin therapeutic range is dependent on dosage and time of specimen collection. A peak range is 20.0-40.0 ug/mL A trough range is 5.0-15.0 ug/mL        Performed at [facility] [address], [place], [place] [address]   .Cooxemetry Panel (carboxy, met, total hgb, O2 sat)     Status: Abnormal   Collection Time: 06/30/18  5:55 PM  Result Value Ref Range   Total hemoglobin 9.8 (L) 12.0 - 16.0 g/dL   O2 Saturation 55.4 %   Carboxyhemoglobin 1.4 0.5 - 1.5 %   Methemoglobin 1.6 (H) 0.0 - 1.5 %  Glucose, capillary     Status: Abnormal   Collection Time: 06/30/18  7:31 PM  Result Value Ref Range   Glucose-Capillary 176 (H) 70 - 99 mg/dL  Hemoglobin and hematocrit, blood     Status: Abnormal   Collection Time: 06/30/18  7:59 PM  Result Value Ref Range   Hemoglobin 8.4 (L) 12.0 - 15.0 g/dL   HCT 30.5 (  L) 36.0 - 46.0 %    Comment: Performed at [facility], [place] [address] [place], [place] [address]  Basic metabolic panel     Status: Abnormal   Collection Time: 06/30/18  7:59 PM  Result Value Ref Range   Sodium 144 135 - 145 mmol/L   Potassium 2.5 (LL) 3.5 - 5.1 mmol/L    Comment: CRITICAL RESULT CALLED TO, READ BACK BY AND VERIFIED WITHLorelee Market RN [phone] R    Chloride 114 (H) 98 - 111 mmol/L    Comment: Please note change in reference range.   CO2 23 22 - 32 mmol/L   Glucose, Bld 168 (H) 70 - 99 mg/dL    Comment: Please note change in reference range.   BUN 28 (H) 8 - 23 mg/dL    Comment: Please note change in reference range.   Creatinine, Ser 1.07 (H) 0.44 - 1.00 mg/dL   Calcium 8.0 (L) 8.9 - 10.3 mg/dL   GFR calc non Af Amer 54 (L) >60 mL/min    GFR calc Af Amer >60 >60 mL/min    Comment: (NOTE) The eGFR has been calculated using the CKD EPI equation. This calculation has not been validated in all clinical situations. eGFR's persistently <60 mL/min signify possible Chronic Kidney Disease.    Anion gap 7 5 - 15    Comment: Performed at [facility] [address], [place], [place] [address]  Procalcitonin - Baseline     Status: None   Collection Time: 06/30/18  7:59 PM  Result Value Ref Range   Procalcitonin 0.31 ng/mL    Comment:        Interpretation: PCT (Procalcitonin) <= 0.5 ng/mL: Systemic infection (sepsis) is not likely. Local bacterial infection is possible. (NOTE)       Sepsis PCT Algorithm           Lower Respiratory Tract                                      Infection PCT Algorithm    ----------------------------     ----------------------------         PCT < 0.25 ng/mL                PCT < 0.10 ng/mL         Strongly encourage             Strongly discourage   discontinuation of antibiotics    initiation of antibiotics    ----------------------------     -----------------------------       PCT 0.25 - 0.50 ng/mL            PCT 0.10 - 0.25 ng/mL               OR       >80% decrease in PCT            Discourage initiation of                                            antibiotics      Encourage discontinuation           of antibiotics    ----------------------------     -----------------------------         PCT >=  0.50 ng/mL              PCT 0.26 - 0.50 ng/mL               AND        <80% decrease in PCT             Encourage initiation of                                             antibiotics       Encourage continuation           of antibiotics    ----------------------------     -----------------------------        PCT >= 0.50 ng/mL                  PCT > 0.50 ng/mL               AND         increase in PCT                  Strongly encourage                                      initiation of  antibiotics    Strongly encourage escalation           of antibiotics                                     -----------------------------                                           PCT <= 0.25 ng/mL                                                 OR                                        > 80% decrease in PCT                                     Discontinue / Do not initiate                                             antibiotics Performed at [facility], [address] [place], [place] [address]   Glucose, capillary     Status: Abnormal   Collection Time: 06/30/18 11:34 PM  Result Value Ref Range   Glucose-Capillary 219 (H) 70 - 99 mg/dL  Hemoglobin and hematocrit, blood     Status: Abnormal   Collection Time: 07/01/18  2:14 AM  Result Value Ref  Range   Hemoglobin 9.1 (L) 12.0 - 15.0 g/dL   HCT 33.3 (L) 36.0 - 46.0 %    Comment: Performed at [facility] [address] [place], [place] [address]  Glucose, capillary     Status: Abnormal   Collection Time: 07/01/18  3:28 AM  Result Value Ref Range   Glucose-Capillary 167 (H) 70 - 99 mg/dL  CBC with Differential/Platelet     Status: Abnormal   Collection Time: 07/01/18  3:41 AM  Result Value Ref Range   WBC 30.5 (H) 4.0 - 10.5 K/uL    Comment: REPEATED TO VERIFY WHITE COUNT CONFIRMED ON SMEAR    RBC 5.06 3.87 - 5.11 MIL/uL   Hemoglobin 9.4 (L) 12.0 - 15.0 g/dL   HCT 34.0 (L) 36.0 - 46.0 %   MCV 67.2 (L) 78.0 - 100.0 fL   MCH 18.6 (L) 26.0 - 34.0 pg   MCHC 27.6 (L) 30.0 - 36.0 g/dL   RDW 21.4 (H) 11.5 - 15.5 %   Platelets 244 150 - 400 K/uL   Neutrophils Relative % 91 %   Lymphocytes Relative 3 %   Monocytes Relative 6 %   Eosinophils Relative 0 %   Basophils Relative 0 %   Neutro Abs 27.8 (H) 1.7 - 7.7 K/uL   Lymphs Abs 0.9 0.7 - 4.0 K/uL   Monocytes Absolute 1.8 (H) 0.1 - 1.0 K/uL   Eosinophils Absolute 0.0 0.0 - 0.7 K/uL   Basophils Absolute 0.0 0.0 - 0.1 K/uL   RBC Morphology RARE NRBCs     Comment:  POLYCHROMASIA PRESENT ELLIPTOCYTES BURR CELLS    WBC Morphology INCREASED BANDS (>20% BANDS)     Comment: Performed at [facility], [place] [address], [place], [place] [address]  Protime-INR     Status: Abnormal   Collection Time: 07/01/18  3:41 AM  Result Value Ref Range   Prothrombin Time 29.9 (H) 11.4 - 15.2 seconds   INR 2.87     Comment: Performed at [facility] [address], [place], [place] [address]  Renal function panel     Status: Abnormal   Collection Time: 07/01/18  3:41 AM  Result Value Ref Range   Sodium 143 135 - 145 mmol/L   Potassium 4.0 3.5 - 5.1 mmol/L    Comment: DELTA CHECK NOTED   Chloride 116 (H) 98 - 111 mmol/L    Comment: Please note change in reference range.   CO2 23 22 - 32 mmol/L   Glucose, Bld 181 (H) 70 - 99 mg/dL    Comment: Please note change in reference range.   BUN 24 (H) 8 - 23 mg/dL    Comment: Please note change in reference range.   Creatinine, Ser 0.93 0.44 - 1.00 mg/dL   Calcium 8.1 (L) 8.9 - 10.3 mg/dL   Phosphorus 1.3 (L) 2.5 - 4.6 mg/dL   Albumin 2.4 (L) 3.5 - 5.0 g/dL   GFR calc non Af Amer >60 >60 mL/min   GFR calc Af Amer >60 >60 mL/min    Comment: (NOTE) The eGFR has been calculated using the CKD EPI equation. This calculation has not been validated in all clinical situations. eGFR's persistently <60 mL/min signify possible Chronic Kidney Disease.    Anion gap 4 (L) 5 - 15    Comment: Performed at [facility] [address] [place], [place] [address]  Magnesium     Status: None   Collection Time: 07/01/18  3:41  AM  Result Value Ref Range   Magnesium 1.7 1.7 - 2.4 mg/dL    Comment: Performed at [facility], [place] [address] [place], [place] [address]  Hepatic function panel     Status: Abnormal   Collection Time: 07/01/18  3:41 AM  Result Value Ref Range   Total Protein 4.5 (L) 6.5 - 8.1 g/dL   Albumin 2.4 (L) 3.5 - 5.0 g/dL   AST 1,281 (H) 15 - 41 U/L   ALT 1,725 (H) 0 - 44 U/L    Comment:  Please note change in reference range.   Alkaline Phosphatase 271 (H) 38 - 126 U/L   Total Bilirubin 3.3 (H) 0.3 - 1.2 mg/dL   Bilirubin, Direct 1.3 (H) 0.0 - 0.2 mg/dL    Comment: Please note change in reference range.   Indirect Bilirubin 2.0 (H) 0.3 - 0.9 mg/dL    Comment: Performed at [facility], [place] [address] [place], [place] [address]  Glucose, capillary     Status: Abnormal   Collection Time: 07/01/18  7:38 AM  Result Value Ref Range   Glucose-Capillary 173 (H) 70 - 99 mg/dL  Hemoglobin and hematocrit, blood     Status: Abnormal   Collection Time: 07/01/18  8:37 AM  Result Value Ref Range   Hemoglobin 9.6 (L) 12.0 - 15.0 g/dL   HCT 34.7 (L) 36.0 - 46.0 %    Comment: Performed at [facility], [place] [address] [place], [place] [address]  Glucose, capillary     Status: Abnormal   Collection Time: 07/01/18 11:17 AM  Result Value Ref Range   Glucose-Capillary 190 (H) 70 - 99 mg/dL  .Cooxemetry Panel (carboxy, met, total hgb, O2 sat)     Status: Abnormal   Collection Time: 07/01/18 11:50 AM  Result Value Ref Range   Total hemoglobin 10.1 (L) 12.0 - 16.0 g/dL   O2 Saturation 45.3 %   Carboxyhemoglobin 1.3 0.5 - 1.5 %   Methemoglobin 1.6 (H) 0.0 - 1.5 %  Glucose, capillary     Status: Abnormal   Collection Time: 07/01/18  3:38 PM  Result Value Ref Range   Glucose-Capillary 256 (H) 70 - 99 mg/dL  Hemoglobin and hematocrit, blood     Status: Abnormal   Collection Time: 07/01/18  3:46 PM  Result Value Ref Range   Hemoglobin 8.3 (L) 12.0 - 15.0 g/dL   HCT 31.0 (L) 36.0 - 46.0 %    Comment: Performed at [facility], [address], [place], [place] [address]  Glucose, capillary     Status: Abnormal   Collection Time: 07/01/18  7:31 PM  Result Value Ref Range   Glucose-Capillary 193 (H) 70 - 99 mg/dL  Hemoglobin and hematocrit, blood     Status: Abnormal   Collection Time: 07/01/18  8:42 PM  Result Value Ref Range   Hemoglobin 10.7 (L) 12.0 - 15.0 g/dL     Comment: DELTA CHECK NOTED REPEATED TO VERIFY    HCT 37.3 36.0 - 46.0 %    Comment: Performed at [facility] [address], [place], [place] [address]  .Cooxemetry Panel (carboxy, met, total hgb, O2 sat)     Status: Abnormal   Collection Time: 07/01/18 10:14 PM  Result Value Ref Range   Total hemoglobin 11.0 (L) 12.0 - 16.0 g/dL   O2 Saturation 57.9 %   Carboxyhemoglobin 1.3 0.5 - 1.5 %   Methemoglobin 1.4 0.0 - 1.5 %  Glucose, capillary     Status: Abnormal   Collection Time: 07/01/18 11:23 PM  Result Value Ref Range   Glucose-Capillary 173 (H) 70 - 99 mg/dL  Hemoglobin and hematocrit, blood     Status: Abnormal   Collection Time: 07/02/18  3:04 AM  Result Value Ref Range   Hemoglobin 10.4 (L) 12.0 - 15.0 g/dL   HCT 35.6 (L) 36.0 - 46.0 %    Comment: Performed at [facility], [place] [address] [place], [place] [address]  Glucose, capillary     Status: Abnormal   Collection Time: 07/02/18  3:31 AM  Result Value Ref Range   Glucose-Capillary 186 (H) 70 - 99 mg/dL  CBC with Differential/Platelet     Status: Abnormal   Collection Time: 07/02/18  4:51 AM  Result Value Ref Range   WBC 46.0 (H) 4.0 - 10.5 K/uL   RBC 5.81 (H) 3.87 - 5.11 MIL/uL   Hemoglobin 10.6 (L) 12.0 - 15.0 g/dL   HCT 37.9 36.0 - 46.0 %   MCV 65.2 (L) 78.0 - 100.0 fL   MCH 18.2 (L) 26.0 - 34.0 pg   MCHC 28.0 (L) 30.0 - 36.0 g/dL   RDW 21.6 (H) 11.5 - 15.5 %   Platelets 197 150 - 400 K/uL   Neutrophils Relative % 92 %   Lymphocytes Relative 2 %   Monocytes Relative 6 %   Eosinophils Relative 0 %   Basophils Relative 0 %   Neutro Abs 42.3 (H) 1.7 - 7.7 K/uL   Lymphs Abs 0.9 0.7 - 4.0 K/uL   Monocytes Absolute 2.8 (H) 0.1 - 1.0 K/uL   Eosinophils Absolute 0.0 0.0 - 0.7 K/uL   Basophils Absolute 0.0 0.0 - 0.1 K/uL   RBC Morphology RARE NRBCs     Comment: ELLIPTOCYTES BURR CELLS    WBC Morphology INCREASED BANDS (>20% BANDS)     Comment: Performed at [facility], [place] [address],  [place], [place] [address]  Comprehensive metabolic panel     Status: Abnormal   Collection Time: 07/02/18  4:51 AM  Result Value Ref Range   Sodium 138 135 - 145 mmol/L   Potassium 3.5 3.5 - 5.1 mmol/L   Chloride 109 98 - 111 mmol/L    Comment: Please note change in reference range.   CO2 21 (L) 22 - 32 mmol/L   Glucose, Bld 173 (H) 70 - 99 mg/dL    Comment: Please note change in reference range.   BUN 22 8 - 23 mg/dL    Comment: Please note change in reference range.   Creatinine, Ser 0.97 0.44 - 1.00 mg/dL   Calcium 7.7 (L) 8.9 - 10.3 mg/dL   Total Protein 4.0 (L) 6.5 - 8.1 g/dL   Albumin 2.1 (L) 3.5 - 5.0 g/dL   AST 493 (H) 15 - 41 U/L   ALT 1,311 (H) 0 - 44 U/L    Comment: Please note change in reference range.   Alkaline Phosphatase 256 (H) 38 - 126 U/L   Total Bilirubin 3.4 (H) 0.3 - 1.2 mg/dL   GFR calc non Af Amer >60 >60 mL/min   GFR calc Af Amer >60 >60 mL/min    Comment: (NOTE) The eGFR has been calculated using the CKD EPI equation. This calculation has not been validated in all clinical situations. eGFR's persistently <60 mL/min signify possible Chronic Kidney Disease.    Anion gap 8 5 - 15    Comment: Performed at [facility], [place] [address], [place], [place] [address]  Digoxin level     Status: None   Collection Time: 07/02/18  4:51 AM  Result Value Ref Range   Digoxin Level 1.2 0.8 - 2.0 ng/mL    Comment: Performed at [place] [address], [place], [place] [address]  Ammonia     Status: Abnormal   Collection Time: 07/02/18  5:00 AM  Result Value Ref Range   Ammonia 49 (H) 9 - 35 umol/L    Comment: Performed at [facility], [place] [address], [place], [place] [address]  Glucose, capillary     Status: Abnormal   Collection Time: 07/02/18  7:48 AM  Result Value Ref Range   Glucose-Capillary 162 (H) 70 - 99 mg/dL  Hemoglobin and hematocrit, blood     Status: Abnormal   Collection Time: 07/02/18  8:06 AM  Result Value Ref Range    Hemoglobin 11.3 (L) 12.0 - 15.0 g/dL   HCT 40.7 36.0 - 46.0 %    Comment: Performed at [facility], [place] [address], [place], [place] [address]  Glucose, capillary     Status: Abnormal   Collection Time: 07/02/18 11:43 AM  Result Value Ref Range   Glucose-Capillary 124 (H) 70 - 99 mg/dL  .Cooxemetry Panel (carboxy, met, total hgb, O2 sat)     Status: Abnormal   Collection Time: 07/02/18 11:55 AM  Result Value Ref Range   Total hemoglobin 10.8 (L) 12.0 - 16.0 g/dL   O2 Saturation 48.8 %   Carboxyhemoglobin 1.7 (H) 0.5 - 1.5 %   Methemoglobin 1.0 0.0 - 1.5 %  Hemoglobin and hematocrit, blood     Status: Abnormal   Collection Time: 07/02/18  2:09 PM  Result Value Ref Range   Hemoglobin 11.3 (L) 12.0 - 15.0 g/dL   HCT 40.0 36.0 - 46.0 %    Comment: Performed at [facility], [place] [address], [place], [place] [address]    Dg Chest Port 1 View  Result Date: 07/01/2018 CLINICAL DATA:  Followup respiratory failure.  Ventilator support. EXAM: PORTABLE CHEST 1 VIEW COMPARISON:  06/30/2018 FINDINGS: Endotracheal tube is 1 cm above the carina. Nasogastric tube enters the stomach. Left internal jugular central line tip is at the SVC RA junction. There is worsened volume loss in the left lower lobe. Mild atelectasis at the right base. IMPRESSION: Worsened volume loss in the left lower lobe. Electronically Signed   By: Nelson Chimes M.D.   On: 07/01/2018 10:31   Dg Abd Portable 1v  Result Date: 07/02/2018 CLINICAL DATA:  62 year old female with nausea vomiting. EXAM: PORTABLE ABDOMEN - 1 VIEW COMPARISON:  Radiograph dated 07/01/2018 FINDINGS: Partially visualized enteric tube with tip in the epigastric area over the gastric air. Dilated air-filled loops of small bowel as well as severe dilatation of the colon similar to prior radiograph of 07/01/2018. Large amount of fecal content noted in the colon. This may represent a mechanical obstruction or a pseudo obstruction. CT may provide better  evaluation. No definite free air identified on the provided images. IMPRESSION: Persistent dilatation of the small and large bowel with large amount of colonic stool burden. Findings may represent mechanical obstruction or colonic pseudo-obstruction. Clinical correlation is recommended. CT may provide better evaluation. Electronically Signed   By: Anner Crete M.D.   On: 07/02/2018 06:18   Dg Abd Portable 1v  Result Date: 07/01/2018 CLINICAL DATA:  Vomiting today.  Constipation and distension.  EXAM: PORTABLE ABDOMEN - 1 VIEW COMPARISON:  One-view chest x-ray 06/29/2017. FINDINGS: There is marked increase in dilated loops of proximal small bowel consistent with progressive obstruction. Extensive stool is present in the ascending colon. There is some gas in the descending colon. NG tube is in place. IMPRESSION: New prominent distal small bowel obstruction. These results will be called to the ordering clinician or representative by the Radiologist Assistant, and communication documented in the PACS or zVision Dashboard. Electronically Signed   By: San Morelle M.D.   On: 07/01/2018 17:43    Review of Systems  Unable to perform ROS: Intubated   Blood pressure (!) 65/32, pulse 90, temperature 98.2 F (36.8 C), temperature source Oral, resp. rate (!) 27, height 5' (1.524 m), weight 83.1 kg (183 lb 3.2 oz), SpO2 96 %. Physical Exam  Constitutional:  Patient is nonresponsive on the vent. She feels hyperthermic and rectal temperature is 96.7.  HENT:  Head: Normocephalic and atraumatic.  Eyes: Right eye exhibits no discharge. Left eye exhibits no discharge. Scleral icterus is present.  Pupils are pinpoint. Both sclera are icteric and edematous.  Neck:  She is intubated NG is taped to the endotracheal tube, so limited exam.  She also has a central line in her left neck.  Cardiovascular:  Tachycardic atrial flutter. She is cold and I do not feel any distal pulses.  Respiratory:  She is on  the ventilator with full support with tubular breath sounds.  GI:  Abdomen is distended, bowel sounds are hypoactive.  Patient is unresponsive to any stimulus.  There is no rebound, not even wincing with deep palpation.  I do not feel any masses.  Musculoskeletal: She exhibits edema.  Neurological:  Patient's unresponsive on the ventilator.  Skin:  Cold, pale, with mild edema both upper and lower extremities.  Psychiatric:  Patient is unresponsive and cannot be assessed.    Assessment/Plan: Abdominal distention with large and small bowel distention -ileus versus SBO Nash cirrhosis with hepatic encephalopathy New onset atrial fibrillation with rapid ventricular rate New onset cardiomyopathy with EF 30 to 35% New Right frontal operculum infarct History of seizure disorder Hypertension Type 2 diabetes well controlled History of schizophrenia   Plan: Patient has a CT ordered of the abdomen pelvis without contrast ordered.  Dr. Donne Hazel is on-call and aware.  He will follow up as soon as the CT is completed.  Brandi [name] 07/02/2018, 3:41 PM

## 2018-07-02 NOTE — Progress Notes (Signed)
Progress Note  Patient Name: Brandi [name] Date of Encounter: 07/02/2018  Primary Cardiologist: Mertie Moores, MD   Subjective   Intubated   Sedated    Inpatient Medications    Scheduled Meds: . chlorhexidine gluconate (MEDLINE KIT)  15 mL Mouth Rinse BID  . Chlorhexidine Gluconate Cloth  6 each Topical Daily  . digoxin  0.25 mg Intravenous Daily  . feeding supplement (VITAL HIGH PROTEIN)  1,000 mL Per Tube Q24H  . fentaNYL (SUBLIMAZE) injection  100 mcg Intravenous Once  . free water  200 mL Per Tube Q8H  . insulin aspart  0-9 Units Subcutaneous Q4H  . iopamidol      . lactulose  300 mL Rectal BID  . mouth rinse  15 mL Mouth Rinse 10 times per day  . metoprolol tartrate  5 mg Intravenous Q3H  . vancomycin variable dose per unstable renal function (pharmacist dosing)   Does not apply See admin instructions   Continuous Infusions: . dextrose 100 mL/hr at 07/02/18 1800  . lacosamide (VIMPAT) IV Stopped (07/02/18 1435)  . levETIRAcetam Stopped (07/02/18 1422)  . norepinephrine (LEVOPHED) Adult infusion Stopped (06/29/18 1618)  . piperacillin-tazobactam (ZOSYN)  IV 12.5 mL/hr at 07/02/18 1800  . vancomycin Stopped (07/01/18 2311)   PRN Meds: Vital Signs    Vitals:   07/02/18 1545 07/02/18 1600 07/02/18 1700 07/02/18 1800  BP:   104/77 (!) 117/98  Pulse:  (!) 29    Resp: (!) 26  (!) 30 (!) 30  Temp:  (!) 96.7 F (35.9 C)    TempSrc:  Rectal    SpO2: 97% 97%    Weight:      Height:        Intake/Output Summary (Last 24 hours) at 07/02/2018 1904 Last data filed at 07/02/2018 1800 Gross per 24 hour  Intake 3304.03 ml  Output 1090 ml  Net 2214.03 ml   Filed Weights   06/30/18 0432 07/01/18 0200 07/02/18 0427  Weight: 77.9 kg (171 lb 11.8 oz) 80.1 kg (176 lb 9.4 oz) 83.1 kg (183 lb 3.2 oz)    Telemetry    Atrial fib /probable atrial flutter  110s to 130   Personally reviewed    ECG      Physical Exam   GEN: Patient intubated , sedated   Neck: Neck  full    Cardiac: RRR Tachycardic   Respiratory: Coarse BS    KZ:[id number]  Decreased BS MS: 1+ edema; 1+ PT   Pulses   Feet cool   Neuro:  INtubated   Sedated    Labs    Chemistry Recent Labs  Lab 06/30/18 0329 06/30/18 1959 07/01/18 0341 07/02/18 0451  NA 151* 144 143 138  K 2.9* 2.5* 4.0 3.5  CL 119* 114* 116* 109  CO2 _0 21*  GLUCOSE 154* 168* 181* 173*  BUN 39* 28* 24* 22  CREATININE 1.71* 1.07* 0.93 0.97  CALCIUM 8.6* 8.0* 8.1* 7.7*  PROT 4.5*  --  4.5* 4.0*  ALBUMIN 2.7*  --  2.4*  2.4* 2.1*  AST 3,278*  --  1,281* 493*  ALT 2,361*  --  1,725* 1,311*  ALKPHOS 318*  --  271* 256*  BILITOT 2.8*  --  3.3* 3.4*  GFRNONAA 31* 54* >60 >60  GFRAA 36* >60 >60 >60  ANIONGAP 9 7 4* 8     Hematology Recent Labs  Lab 06/30/18 0329  07/01/18 0341  07/02/18 0451 07/02/18 0806 07/02/18 1409  WBC 23.6*  --  30.5*  --  46.0*  --   --   RBC 4.18  --  5.06  --  5.81*  --   --   HGB 7.8*   < > 9.4*   < > 10.6* 11.3* 11.3*  HCT 28.3*   < > 34.0*   < > 37.9 40.7 40.0  MCV 67.7*  --  67.2*  --  65.2*  --   --   MCH 18.7*  --  18.6*  --  18.2*  --   --   MCHC 27.6*  --  27.6*  --  28.0*  --   --   RDW 20.6*  --  21.4*  --  21.6*  --   --   PLT 271  --  244  --  197  --   --    < > = values in this interval not displayed.    Cardiac Enzymes Recent Labs  Lab 06/29/18 1605 06/29/18 2035 06/30/18 0211  TROPONINI 0.20* 0.28* 0.26*   No results for input(s): TROPIPOC in the last 168 hours.   BNP Recent Labs  Lab 06/29/18 1605  BNP 2,383.4*     DDimer No results for input(s): DDIMER in the last 168 hours.   Radiology    Dg Chest Port 1 View  Result Date: 07/01/2018 CLINICAL DATA:  Followup respiratory failure.  Ventilator support. EXAM: PORTABLE CHEST 1 VIEW COMPARISON:  06/30/2018 FINDINGS: Endotracheal tube is 1 cm above the carina. Nasogastric tube enters the stomach. Left internal jugular central line tip is at the SVC RA junction. There is worsened  volume loss in the left lower lobe. Mild atelectasis at the right base. IMPRESSION: Worsened volume loss in the left lower lobe. Electronically Signed   By: Nelson Chimes M.D.   On: 07/01/2018 10:31   Dg Abd Portable 1v  Result Date: 07/02/2018 CLINICAL DATA:  62 year old female with nausea vomiting. EXAM: PORTABLE ABDOMEN - 1 VIEW COMPARISON:  Radiograph dated 07/01/2018 FINDINGS: Partially visualized enteric tube with tip in the epigastric area over the gastric air. Dilated air-filled loops of small bowel as well as severe dilatation of the colon similar to prior radiograph of 07/01/2018. Large amount of fecal content noted in the colon. This may represent a mechanical obstruction or a pseudo obstruction. CT may provide better evaluation. No definite free air identified on the provided images. IMPRESSION: Persistent dilatation of the small and large bowel with large amount of colonic stool burden. Findings may represent mechanical obstruction or colonic pseudo-obstruction. Clinical correlation is recommended. CT may provide better evaluation. Electronically Signed   By: Anner Crete M.D.   On: 07/02/2018 06:18   Dg Abd Portable 1v  Result Date: 07/01/2018 CLINICAL DATA:  Vomiting today.  Constipation and distension. EXAM: PORTABLE ABDOMEN - 1 VIEW COMPARISON:  One-view chest x-ray 06/29/2017. FINDINGS: There is marked increase in dilated loops of proximal small bowel consistent with progressive obstruction. Extensive stool is present in the ascending colon. There is some gas in the descending colon. NG tube is in place. IMPRESSION: New prominent distal small bowel obstruction. These results will be called to the ordering clinician or representative by the Radiologist Assistant, and communication documented in the PACS or zVision Dashboard. Electronically Signed   By: San Morelle M.D.   On: 07/01/2018 17:43      Assessment & Plan    1   CHF  Pt remains tachycardic  Receiving IV  fluids I/O 16 L positive  2    Rhythm    Afib /probable atrial flutter   Rates now 100s to 110s   WIll increase frequency of lopressor with hopefully better rate contro And better forward outpt  3  GI  Events of today noted    CT of abdomen ordered    Pt remains critically ill  Dorris Carnes         For questions or updates, please contact [place] Please consult [email or web address] for contact info under Cardiology/STEMI.      Signed, Dorris Carnes, MD  07/02/2018, 7:04 PM

## 2018-07-02 NOTE — Progress Notes (Signed)
eLink Physician-Brief Progress Note Patient Name: Brandi [name] DOB: 06-Mar-1956 MRN: [id number]   Date of Service  07/02/2018  HPI/Events of Note  hgb stable  eICU Interventions  D/c q6h labs     Intervention Category Minor Interventions: Routine modifications to care plan (e.g. PRN medications for pain, fever)  Simonne Maffucci 07/02/2018, 9:51 PM

## 2018-07-02 NOTE — Progress Notes (Signed)
Transported pt from 3M07 to CT and back with not complications.

## 2018-07-02 NOTE — Progress Notes (Signed)
Placed patient back on PRVC due to periods of apnea during weaning.

## 2018-07-02 NOTE — Progress Notes (Signed)
PULMONARY / CRITICAL CARE MEDICINE   Name: Brandi [name] MRN: [id number] DOB: 1956/11/01    ADMISSION DATE:  06/23/2018 CONSULTATION DATE: 06/29/2018  REFERRING MD: Dr. Bonner Puna  CHIEF COMPLAINT: Altered mental status  HISTORY OF PRESENT ILLNESS:  62 y/o F who presented to [facility] on 7/[facility] with abdominal pain, nausea, vomiting, decreased oral intake and weakness.    She was reportedly evaluated by her PCP prior to admit with elevated LFT's (AST 302, ALT 611, AP 380).  Follow up CT of the abdomen demonstrated prominent caudate lobe and surface irregularity of the liver.   In the ER, US of the abdomen confirmed the CT findings of the liver.  She was noted to be lethargic and have asterixis on exam.  Ammonia 22 (7/10) > 38 (7/12).  The patient had AFwRVR and was treated with cardizem.  She was admitted for further evaluation.  The patient was seen by Cardiology and transitioned to metoprolol for rate control due to decreased LVEF (30-35%). Labs showed a microcytic anemia with ferritin of 9.  GI was consulted for evaluation with recommendations for lactulose and eventual endoscopic evaluation.  She had ongoing encephalopathy and a CT of the head was obtained which showed a right frontal operculum CVA.  Neurology evaluated the patient and felt the stroke was likely related to AF but not the cause of her mental status.  She was recommended anticoagulation and passed a swallow evaluation converting from heparin to eliquis.  On 7/15, her confusion, tardive dyskinesia symptoms and lethargy worsened as well as her LFT's (AST 3942, ALT 1976, ALK Phos 404), renal function and WBC.  PCCM consulted for evaluation.    SUBJECTIVE:  Weaning well on vent but MS not where it needs to be to consider extubation. New SBO on KUB yesterday. Did not tolerate tube feeding. Vomiting throughout the night. NGT changed to continuous suction.   VITAL SIGNS: BP 94/81   Pulse (!) 25   Temp (!) 97.2 F (36.2 C) (Oral)   Resp (!) 27    Ht 5' (1.524 m)   Wt 83.1 kg (183 lb 3.2 oz)   SpO2 97%   BMI 35.78 kg/m   HEMODYNAMICS: CVP:  [3 mmHg-5 mmHg] 5 mmHg  VENTILATOR SETTINGS: Vent Mode: PSV;CPAP FiO2 (%):  [40 %] 40 % Set Rate:  [18 bmp] 18 bmp Vt Set:  [400 mL] 400 mL PEEP:  [5 cmH20-8 cmH20] 5 cmH20 Pressure Support:  [5 cmH20] 5 cmH20 Plateau Pressure:  [12 cmH20-25 cmH20] 17 cmH20  INTAKE / OUTPUT: I/O last 3 completed shifts: In: 6695.6 [I.V.:4570.1; Other:120; NG/GT:581.7; IV Piggyback:1423.8] Out: 1745 [GTXMI:6803; Emesis/NG output:400]  PHYSICAL EXAMINATION: General:  Critically ill female lying in bed initially severely agitated, resolved after sedation and return to full MV support  HEENT: MM pink/moist/ ETT/ OGT,  Neuro: does not open eyes, MAE- non purposeful, not f/c CV: IRIR, +1 pulses PULM: tachypneic/agitation on PSV, placed back on full MV, improved tachypnea, lungs bilaterally coarse GI: obese, NT, hyperBS Extremities: cool/moist, +1 generalized edema Skin: no rashes, no sites of bleeding noted  LABS:  BMET Recent Labs  Lab 06/30/18 1959 07/01/18 0341 07/02/18 0451  NA 144 143 138  K 2.5* 4.0 3.5  CL 114* 116* 109  CO2 23 23 21*  BUN 28* 24* 22  CREATININE 1.07* 0.93 0.97  GLUCOSE 168* 181* 173*    Electrolytes Recent Labs  Lab 06/29/18 1605  06/30/18 1959 07/01/18 0341 07/02/18 0451  CALCIUM 9.3   < >  8.0* 8.1* 7.7*  MG 2.1  --   --  1.7  --   PHOS  --   --   --  1.3*  --    < > = values in this interval not displayed.    CBC Recent Labs  Lab 06/30/18 0329  07/01/18 0341  07/02/18 0304 07/02/18 0451 07/02/18 0806  WBC 23.6*  --  30.5*  --   --  46.0*  --   HGB 7.8*   < > 9.4*   < > 10.4* 10.6* 11.3*  HCT 28.3*   < > 34.0*   < > 35.6* 37.9 40.7  PLT 271  --  244  --   --  197  --    < > = values in this interval not displayed.    Coag's Recent Labs  Lab 06/29/18 2302 06/30/18 1331 07/01/18 0341  INR 7.88* 3.37 2.87    Sepsis Markers Recent  Labs  Lab 06/29/18 1244 06/29/18 1605 06/29/18 2035 06/30/18 1959  LATICACIDVEN 10.8* 8.3* 4.6*  --   PROCALCITON  --   --   --  0.31    ABG Recent Labs  Lab 06/29/18 1606 06/30/18 0510  PHART 7.263* 7.471*  PCO2ART 35.2 32.2  PO2ART 333.0* 98.1    Liver Enzymes Recent Labs  Lab 06/30/18 0329 07/01/18 0341 07/02/18 0451  AST 3,278* 1,281* 493*  ALT 2,361* 1,725* 1,311*  ALKPHOS 318* 271* 256*  BILITOT 2.8* 3.3* 3.4*  ALBUMIN 2.7* 2.4*  2.4* 2.1*    Cardiac Enzymes Recent Labs  Lab 06/29/18 1605 06/29/18 2035 06/30/18 0211  TROPONINI 0.20* 0.28* 0.26*    Glucose Recent Labs  Lab 07/01/18 1538 07/01/18 1931 07/01/18 2323 07/02/18 0331 07/02/18 0748 07/02/18 1143  GLUCAP 256* 193* 173* 186* 162* 124*    Imaging  STUDIES:  CT Head 7/9 >> atrophy with small vessel chronic ischemic changes of deep cerebral white matter CT Renal Study 7/9 >> no acute findings, bilateral non-obstructing renal stones ECHO 7/9 >> LV mildly dilated, wall thickness was increased in a pattern of moderate LVH, LVEF 30-35%, severe hypokinesis of the anteroseptal, anterior, inferoseptal & apical myocardium, mild to moderate MV regurgitation, LA severely dilated, RV moderately dilated, RA mod-severely dilated, PA pressure 26mHg, trivial pericardial effusion. CTA Head, Neck 7/12 >> minimal calcified plaque at the carotid bifurcation regions but no stenosis or irregularity, no large vessel occlusions, unable to specifically identify the missing branch vessel responsible for the right posterior frontal infarction but this is presumably an occluded M3 or distal branch CT Head w/o 7/12 >> the small right middle cerebral artery distribution, posterolateral frontal lobe infarct has evolved since the prior CT, it is now seen as a well defined area of hypoattenuation, no new areas of infarct, no ICH CT head 7/15 >> Redemonstration of right posterior frontal lobe infarct. No acute intracranial  hemorrhage or new infarct identified.  Atrophy with chronic small vessel ischemia.  CULTURES: BCx2 7/10 >> negative   ANTIBIOTICS: Vanco 7/15 >>  Zoysn 7/15 >>   SIGNIFICANT EVENTS: 7/09  Admit  7/15  PCCM consulted with AMS, acute liver failure  LINES/TUBES: ETT 7/15 >>  DISCUSSION: 62y/o F admitted with weakness, decreased intake and lethargy.  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  Recommended for anticoagulation by Cardiology/Neurology, was transitioned from heparin to eliquis.  The patient deteriorated on 7/15 with a rise in LFT's, AKI, confusion/AMS, worsening  of tardive dyskinesia type symptoms.  Transferred to ICU.    ASSESSMENT / PLAN:  PULMONARY A: Acute Respiratory Insufficiency - in setting of liver failure  Bilateral Pleural Effusions - suspect in setting of liver disease, HF, AKI P:   Weaning 5/5 and tol well.  VAP bundle Mental status is barrier to further weaning.   CARDIOVASCULAR A:  HFrEF A. fib with RVR HTN - mildly elevated troponin's; EKG unchanged  P:  Continue tele monitoring Cardiology following, appreciate assistance Lopressor IV and digoxin started by cardiology  Hold further anticoagulation in setting of hepatic failure coagulopathy.   RENAL A:   AKI - in setting of liver injury, NASH Hypernatremia Hypokalemia  P:   Appreciate Renal assistance Free water 229m q 8 hr Replace mag, Phos today.  Trend BMP / mag/ phos/ daily/ urinary output  GASTROINTESTINAL A:   [place] Cirrhosis with Hepatic Encephalopathy Hepatic dysfunction/ shock  Suspected UGIB GERD - unclear in what precipitated change in events LFTs/ decompensation 7/15 - 7/15 UKorealiver w/doppler with no evidence of portal venous thrombosis   P:   Appreciate GI input Restart TF today.  Continue lactulose BID Trend ammonia Trend LFT and coag Soap sud enema failed CT abdomen Consult general surgery    HEMATOLOGIC A:   Iron Deficiency Anemia  Bleeding risk  Hx Breast Cancer - s/p 3 units FFP 7/16 am P:  Follow INR  Follow CBC/coags daily Transfuse for hgb < 7 Monitor for bleeding SCDs only    INFECTIOUS A:   Leukocytosis Rule Out Sepsis - SBP?, other etiology  - remains afebrile P:   Follow cultures Continue empiric vanc / zosyn for now Ideally would dc vanco, but WBC remain very high Trend WBC/ fever curve   ENDOCRINE A:   DM 2 Hypoglycemia  P:   CBG q 4 SSI  NEUROLOGIC A:   Acute metabolic encephalopathy Acute delirium Right Frontal Operculum infarct - felt related to AF/embolic but not all cause of mental status Right-sided weakness Seizure disorder  Schizophrenia  P: RASS goal: 0 to -1  Continue PRN fentanyl and versed given hepatic failure Ongoing neuro assessments Correct metabolic derangements as above  Hold Buspar, Effexor > restart once acute phase illness resolved as can withdrawal Restart Keppra now that Na improved.   FAMILY  - Updates: Family updated yesterday. Not here today  PGeorgann Housekeeper AGACNP-BC LCompass Behavioral Center Of AlexandriaPulmonology/Critical Care Pager [phone] 07/02/2018 12:06 PM

## 2018-07-02 NOTE — Progress Notes (Signed)
CSW continuing to follow and assist with disposition when stable  Jorge Ny, [place] Social Worker [phone]

## 2018-07-03 LAB — CBC WITH DIFFERENTIAL/PLATELET
BAND NEUTROPHILS: 0 %
BASOS PCT: 0 %
BLASTS: 0 %
Basophils Absolute: 0 10*3/uL (ref 0.0–0.1)
EOS ABS: 0 10*3/uL (ref 0.0–0.7)
Eosinophils Relative: 0 %
HEMATOCRIT: 33.3 % — AB (ref 36.0–46.0)
Hemoglobin: 9.9 g/dL — ABNORMAL LOW (ref 12.0–15.0)
LYMPHS PCT: 3 %
Lymphs Abs: 1.4 10*3/uL (ref 0.7–4.0)
MCH: 19.1 pg — ABNORMAL LOW (ref 26.0–34.0)
MCHC: 29.7 g/dL — ABNORMAL LOW (ref 30.0–36.0)
MCV: 64.4 fL — ABNORMAL LOW (ref 78.0–100.0)
Metamyelocytes Relative: 0 %
Monocytes Absolute: 3.3 10*3/uL — ABNORMAL HIGH (ref 0.1–1.0)
Monocytes Relative: 7 %
Myelocytes: 0 %
Neutro Abs: 42 10*3/uL — ABNORMAL HIGH (ref 1.7–7.7)
Neutrophils Relative %: 90 %
Other: 0 %
Platelets: 135 10*3/uL — ABNORMAL LOW (ref 150–400)
Promyelocytes Relative: 0 %
RBC: 5.17 MIL/uL — ABNORMAL HIGH (ref 3.87–5.11)
RDW: 21.4 % — ABNORMAL HIGH (ref 11.5–15.5)
WBC: 46.7 10*3/uL — ABNORMAL HIGH (ref 4.0–10.5)
nRBC: 0 /100 WBC

## 2018-07-03 LAB — COOXEMETRY PANEL
Carboxyhemoglobin: 1.7 % — ABNORMAL HIGH (ref 0.5–1.5)
Methemoglobin: 1.3 % (ref 0.0–1.5)
O2 Saturation: 98.3 %
TOTAL HEMOGLOBIN: 9.8 g/dL — AB (ref 12.0–16.0)

## 2018-07-03 LAB — COMPREHENSIVE METABOLIC PANEL
ALK PHOS: 270 U/L — AB (ref 38–126)
ALT: 745 U/L — AB (ref 0–44)
AST: 127 U/L — ABNORMAL HIGH (ref 15–41)
Albumin: 2.1 g/dL — ABNORMAL LOW (ref 3.5–5.0)
Anion gap: 11 (ref 5–15)
BUN: 34 mg/dL — ABNORMAL HIGH (ref 8–23)
CALCIUM: 8 mg/dL — AB (ref 8.9–10.3)
CHLORIDE: 110 mmol/L (ref 98–111)
CO2: 17 mmol/L — ABNORMAL LOW (ref 22–32)
CREATININE: 1.45 mg/dL — AB (ref 0.44–1.00)
GFR, EST AFRICAN AMERICAN: 44 mL/min — AB (ref >60)
GFR, EST NON AFRICAN AMERICAN: 38 mL/min — AB (ref >60)
Glucose, Bld: 123 mg/dL — ABNORMAL HIGH (ref 70–99)
Potassium: 4.2 mmol/L (ref 3.5–5.1)
Sodium: 138 mmol/L (ref 135–145)
TOTAL PROTEIN: 4.3 g/dL — AB (ref 6.5–8.1)
Total Bilirubin: 4.3 mg/dL — ABNORMAL HIGH (ref 0.3–1.2)

## 2018-07-03 LAB — GLUCOSE, CAPILLARY
GLUCOSE-CAPILLARY: 103 mg/dL — AB (ref 70–99)
GLUCOSE-CAPILLARY: 129 mg/dL — AB (ref 70–99)
GLUCOSE-CAPILLARY: 176 mg/dL — AB (ref 70–99)
Glucose-Capillary: 110 mg/dL — ABNORMAL HIGH (ref 70–99)
Glucose-Capillary: 122 mg/dL — ABNORMAL HIGH (ref 70–99)
Glucose-Capillary: 130 mg/dL — ABNORMAL HIGH (ref 70–99)

## 2018-07-03 LAB — VANCOMYCIN, TROUGH: VANCOMYCIN TR: 15 ug/mL (ref 15–20)

## 2018-07-03 LAB — PHOSPHORUS: Phosphorus: 3.7 mg/dL (ref 2.5–4.6)

## 2018-07-03 LAB — MAGNESIUM: MAGNESIUM: 1.7 mg/dL (ref 1.7–2.4)

## 2018-07-03 LAB — AMMONIA: AMMONIA: 54 umol/L — AB (ref 9–35)

## 2018-07-03 NOTE — Progress Notes (Signed)
Patient ID: Brandi [name], female   DOB: [birth year], 62 y.o.   MRN: [id number]       Subjective: Pt not sedated, but not responsive to pain, voice etc, but will try to move her right left off the bed.  Objective: Vital signs in last 24 hours: Temp:  [96.7 F (35.9 C)-98.7 F (37.1 C)] 98.2 F (36.8 C) (07/19 0800) Pulse Rate:  [29-118] 118 (07/19 0800) Resp:  [19-32] 25 (07/19 0800) BP: (42-147)/(19-121) 119/104 (07/19 0800) SpO2:  [96 %-100 %] 100 % (07/19 0800) Arterial Line BP: (83-183)/(48-88) 169/77 (07/19 0800) FiO2 (%):  [40 %] 40 % (07/19 0737) Weight:  [84 kg (185 lb 3 oz)] 84 kg (185 lb 3 oz) (07/19 0500) Last BM Date: 06/28/18  Intake/Output from previous day: 07/18 0701 - 07/19 0700 In: 2403.6 [I.V.:1506.9; IV Piggyback:896.8] Out: 836 [Urine:735; Emesis/NG output:100; Stool:1] Intake/Output this shift: No intake/output data recorded.  PE: Abd: soft, obese, some distention, unable to tell if she is tender or not.  Lab Results:  Recent Labs    07/02/18 0451  07/02/18 2037 07/03/18 0424  WBC 46.0*  --   --  46.7*  HGB 10.6*   < > 10.7* 9.9*  HCT 37.9   < > 36.6 33.3*  PLT 197  --   --  135*   < > = values in this interval not displayed.   BMET Recent Labs    07/01/18 0341 07/02/18 0451  NA 143 138  K 4.0 3.5  CL 116* 109  CO2 23 21*  GLUCOSE 181* 173*  BUN 24* 22  CREATININE 0.93 0.97  CALCIUM 8.1* 7.7*   PT/INR Recent Labs    06/30/18 1331 07/01/18 0341  LABPROT 33.9* 29.9*  INR 3.37 2.87   CMP     Component Value Date/Time   NA 138 07/02/2018 0451   NA 138 06/22/2018 1350   NA 144 10/24/2014 1336   K 3.5 07/02/2018 0451   K 3.5 10/24/2014 1336   CL 109 07/02/2018 0451   CL 108 (H) 06/24/2013 0610   CL 103 10/22/2012 1343   CO2 21 (L) 07/02/2018 0451   CO2 31 (H) 10/24/2014 1336   GLUCOSE 173 (H) 07/02/2018 0451   GLUCOSE 116 10/24/2014 1336   GLUCOSE 153 (H) 10/22/2012 1343   BUN 22 07/02/2018 0451   BUN 19 06/22/2018  1350   BUN 14.9 10/24/2014 1336   CREATININE 0.97 07/02/2018 0451   CREATININE 0.77 10/22/2017 1007   CREATININE 0.8 10/24/2014 1336   CALCIUM 7.7 (L) 07/02/2018 0451   CALCIUM 10.7 (H) 10/24/2014 1336   PROT 4.0 (L) 07/02/2018 0451   PROT 5.9 (L) 06/22/2018 1350   PROT 7.6 10/24/2014 1336   ALBUMIN 2.1 (L) 07/02/2018 0451   ALBUMIN 3.8 06/22/2018 1350   ALBUMIN 4.1 10/24/2014 1336   AST 493 (H) 07/02/2018 0451   AST 21 10/24/2014 1336   ALT 1,311 (H) 07/02/2018 0451   ALT 31 10/24/2014 1336   ALKPHOS 256 (H) 07/02/2018 0451   ALKPHOS 138 10/24/2014 1336   BILITOT 3.4 (H) 07/02/2018 0451   BILITOT 1.1 06/22/2018 1350   BILITOT 0.44 10/24/2014 1336   GFRNONAA >60 07/02/2018 0451   GFRNONAA >60 06/24/2013 0610   GFRAA >60 07/02/2018 0451   GFRAA >60 06/24/2013 0610   Lipase     Component Value Date/Time   LIPASE 37 06/23/2018 0119       Studies/Results: Ct Abdomen Pelvis Wo Contrast  Result Date: 07/02/2018  CLINICAL DATA:  Possible small bowel obstruction. Abdominal stent chin. EXAM: CT ABDOMEN AND PELVIS WITHOUT CONTRAST TECHNIQUE: Multidetector CT imaging of the abdomen and pelvis was performed following the standard protocol without IV contrast. COMPARISON:  CT abdomen pelvis 06/23/2018 Abdominal radiograph 07/02/2018 FINDINGS: LOWER CHEST: Bilateral pleural effusions with associated atelectasis. HEPATOBILIARY: Normal hepatic contours and density. No intra- or extrahepatic biliary dilatation. Small amount of perihepatic ascites. Surgically absent gallbladder. PANCREAS: Normal parenchymal contours without ductal dilatation. No peripancreatic fluid collection. SPLEEN: Normal. ADRENALS/URINARY TRACT: --Adrenal glands: Normal. --Right kidney/ureter: Multiple nonobstructing renal calculi measuring up to 5 mm. --Left kidney/ureter: Multiple nonobstructing calculi, measuring up to 4 mm. --Urinary bladder: Decompressed by Foley catheter. STOMACH/BOWEL: --Stomach/Duodenum: Nasogastric  tube tip at the gastroduodenal junction. --Small bowel: No dilatation or inflammation. --Colon: Diffuse dilatation of the colon with large amount of stool in the ascending colon. The rectum is relatively decompressed. --Appendix: Not visualized. VASCULAR/LYMPHATIC: Normal course and caliber of the major abdominal vessels. No abdominal or pelvic lymphadenopathy. REPRODUCTIVE: Status post hysterectomy. No adnexal mass. MUSCULOSKELETAL. Multilevel degenerative disc disease and facet arthrosis. No bony spinal canal stenosis. OTHER: Anasarca IMPRESSION: 1. Diffuse dilatation of the colon with large amount of ascending colonic stool. 2. No evidence of small-bowel obstruction. 3. Small bilateral pleural effusions, small volume abdominal ascites and anasarca. 4. Bilateral nonobstructing nephrolithiasis. Electronically Signed   By: Ulyses Jarred M.D.   On: 07/02/2018 22:52   Dg Abd Portable 1v  Result Date: 07/02/2018 CLINICAL DATA:  62 year old female with nausea vomiting. EXAM: PORTABLE ABDOMEN - 1 VIEW COMPARISON:  Radiograph dated 07/01/2018 FINDINGS: Partially visualized enteric tube with tip in the epigastric area over the gastric air. Dilated air-filled loops of small bowel as well as severe dilatation of the colon similar to prior radiograph of 07/01/2018. Large amount of fecal content noted in the colon. This may represent a mechanical obstruction or a pseudo obstruction. CT may provide better evaluation. No definite free air identified on the provided images. IMPRESSION: Persistent dilatation of the small and large bowel with large amount of colonic stool burden. Findings may represent mechanical obstruction or colonic pseudo-obstruction. Clinical correlation is recommended. CT may provide better evaluation. Electronically Signed   By: Anner Crete M.D.   On: 07/02/2018 06:18   Dg Abd Portable 1v  Result Date: 07/01/2018 CLINICAL DATA:  Vomiting today.  Constipation and distension. EXAM: PORTABLE  ABDOMEN - 1 VIEW COMPARISON:  One-view chest x-ray 06/29/2017. FINDINGS: There is marked increase in dilated loops of proximal small bowel consistent with progressive obstruction. Extensive stool is present in the ascending colon. There is some gas in the descending colon. NG tube is in place. IMPRESSION: New prominent distal small bowel obstruction. These results will be called to the ordering clinician or representative by the Radiologist Assistant, and communication documented in the PACS or zVision Dashboard. Electronically Signed   By: San Morelle M.D.   On: 07/01/2018 17:43    Anti-infectives: Anti-infectives (From admission, onward)   Start     Dose/Rate Route Frequency Ordered Stop   06/30/18 2200  vancomycin (VANCOCIN) IVPB 1000 mg/200 mL premix     1,000 mg 200 mL/hr over 60 Minutes Intravenous Every 24 hours 06/30/18 2008     06/30/18 2200  piperacillin-tazobactam (ZOSYN) IVPB 3.375 g     3.375 g 12.5 mL/hr over 240 Minutes Intravenous Every 8 hours 06/30/18 2011     06/30/18 0000  vancomycin variable dose per unstable renal function (pharmacist dosing)  Status:  Discontinued  Does not apply See admin instructions 06/29/18 1229 07/03/18 0747   06/29/18 1300  piperacillin-tazobactam (ZOSYN) IVPB 2.25 g  Status:  Discontinued     2.25 g 100 mL/hr over 30 Minutes Intravenous Every 8 hours 06/29/18 1229 06/30/18 2011   06/29/18 1300  vancomycin (VANCOCIN) 1,250 mg in sodium chloride 0.9 % 250 mL IVPB     1,250 mg 166.7 mL/hr over 90 Minutes Intravenous  Once 06/29/18 1229 06/29/18 2234       Assessment/Plan Abdominal distention with large and small bowel distention  -CT shows ileus.  No findings in abdomen to explain WBC of 46K or medical decline. -colon appears at max 10cm dilated.  If continues to increase in size could need decompression, but doubt that is needed at this time.   -no surgical indications at this point -cont conservative management  Karlene Lineman cirrhosis  with hepatic encephalopathy New onset atrial fibrillation with rapid ventricular rate New onset cardiomyopathy with EF 30 to 35% New Right frontal operculum infarct History of seizure disorder Hypertension Type 2 diabetes well controlled History of schizophrenia  FEN: NPO VTE: SCDs ID: [email or web address]   LOS: 10 days    Henreitta Cea , [facility] Surgery 07/03/2018, 10:06 AM Pager: [phone]

## 2018-07-03 NOTE — Care Management Note (Addendum)
Case Management Note  Patient Details  Name: Brandi [name] MRN: [id number] Date of Birth: Feb 15, 1956  Subjective/Objective: Presents with weakness, decreased intake and lethargy. 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 She developed AMS >small infarct thought not related to AMS. The patient deteriorated on 7/15 with a rise in LFT's, AKI, confusion/AMS, Transferred to ICU  7/30 Tomi Bamberger RN, BSN - extubated, reintubated- has perforation, had cardiac arrest, emergently taken to OR on 7/28, septic shock and metabolic acidoss with open abd wound with vac. Discussed in LOS, will be a good ltach candidate.    7/31 Tomi Bamberger RN, BSN - NCM spoke with [facility] and he states they also think when patient stablizes would be good ltach candidate.  Reps with [facility] and [facility] both can offer beds .   8/1 Tomi Bamberger RN, BSN - Patient spouse chose [facility], referral given to [facility] with [facility] to call and speak with him tomorrow.  Patient will probably need trach before going to ltach.  8/7 Tomi Bamberger RN, BSN- patient received trach today, plan for ltach tomorrow.                      Action/Plan: Patient will be Ltach candidate once stable enough.  Expected Discharge Date:  (unknown)               Expected Discharge Plan:  Skilled Nursing Facility  In-House Referral:  Clinical Social Work  Discharge planning Services  CM Consult  Post Acute Care Choice:    Choice offered to:     DME Arranged:    DME Agency:     HH Arranged:    [place] Agency:     Status of Service:  In process, will continue to follow  If discussed at Long Length of Stay Meetings, dates discussed:    Additional Comments:  [name], RN 07/03/2018, 12:15 PM

## 2018-07-03 NOTE — Progress Notes (Signed)
Pharmacy Antibiotic Note  Brandi [name] is a 62 y.o. female admitted on 06/23/2018 with sepsis.  Pharmacy has been consulted for vancomycin and Zosyn dosing.  Vancomycin trough is 15 on 1g IV q24h - therapeutic for goal range.   Plan: Continue Zosyn to 3.375 grams iv Q 8 hours Continue Vancomycin 1 gram iv Q 24 hours   Height: 5' (152.4 cm) Weight: 185 lb 3 oz (84 kg) IBW/kg (Calculated) : 45.5  Temp (24hrs), Avg:98.5 F (36.9 C), Min:97.9 F (36.6 C), Max:99.5 F (37.5 C)  Recent Labs  Lab 06/29/18 0755  06/29/18 1244 06/29/18 1605 06/29/18 2035 06/30/18 0329 06/30/18 1703 06/30/18 1959 07/01/18 0341 07/02/18 0451 07/03/18 0424 07/03/18 0911 07/03/18 2130  WBC 31.9*  --   --   --   --  23.6*  --   --  30.5* 46.0* 46.7*  --   --   CREATININE 1.90*   < >  --  2.31*  --  1.71*  --  1.07* 0.93 0.97  --  1.45*  --   LATICACIDVEN  --   --  10.8* 8.3* 4.6*  --   --   --   --   --   --   --   --   VANCOTROUGH  --   --   --   --   --   --   --   --   --   --   --   --  15  VANCORANDOM  --   --   --   --   --   --  6  --   --   --   --   --   --    < > = values in this interval not displayed.    Estimated Creatinine Clearance: 38.7 mL/min (A) (by C-G formula based on SCr of 1.45 mg/dL (H)).    Allergies  Allergen Reactions  . Penicillins Rash    Has patient had a PCN reaction causing immediate rash, facial/tongue/throat swelling, SOB or lightheadedness with hypotension: No Has patient had a PCN reaction causing severe rash involving mucus membranes or skin necrosis: No Has patient had a PCN reaction that required hospitalization No Has patient had a PCN reaction occurring within the last 10 years: No If all of the above answers are "NO", then may proceed with Cephalosporin use.     Antimicrobials this admission: Vacnomycin 7/15 >>  Zosyn 7/15 >>   Dose adjustments this admission: n/a  Microbiology results: 7/10 BCx: neg 7/19 MRSA PCR: neg  Thank you for  allowing pharmacy to be a part of this patient's care.  Sloan Leiter, PharmD, BCPS, BCCCP Clinical Pharmacist Clinical phone 07/03/2018 until 10:30PM- #[phone] After hours, please call [phone] 07/03/2018 10:27 PM

## 2018-07-03 NOTE — Progress Notes (Signed)
PULMONARY / CRITICAL CARE MEDICINE   Name: Brandi [name] MRN: [id number] DOB: November 08, 1956    ADMISSION DATE:  06/23/2018 CONSULTATION DATE: 06/29/2018  REFERRING MD: Dr. Bonner Puna  CHIEF COMPLAINT: Altered mental status  HISTORY OF PRESENT ILLNESS:  62 y/o F who presented to [facility] on 7/9 with abdominal pain, nausea, vomiting, decreased oral intake and weakness.    She was reportedly evaluated by her PCP prior to admit with elevated LFT's (AST 302, ALT 611, AP 380).  Follow up CT of the abdomen demonstrated prominent caudate lobe and surface irregularity of the liver.   In the ER, US of the abdomen confirmed the CT findings of the liver.  She was noted to be lethargic and have asterixis on exam.  Ammonia 22 (7/10) > 38 (7/12).  The patient had AFwRVR and was treated with cardizem.  She was admitted for further evaluation.  The patient was seen by Cardiology and transitioned to metoprolol for rate control due to decreased LVEF (30-35%). Labs showed a microcytic anemia with ferritin of 9.  GI was consulted for evaluation with recommendations for lactulose and eventual endoscopic evaluation.  She had ongoing encephalopathy and a CT of the head was obtained which showed a right frontal operculum CVA.  Neurology evaluated the patient and felt the stroke was likely related to AF but not the cause of her mental status.  She was recommended anticoagulation and passed a swallow evaluation converting from heparin to eliquis.  On 7/15, her confusion, tardive dyskinesia symptoms and lethargy worsened as well as her LFT's (AST 3942, ALT 1976, ALK Phos 404), renal function and WBC.  PCCM consulted for evaluation.    SUBJECTIVE:  CT scan demonstrated ileus. Remains unresponsive on vent.   VITAL SIGNS: BP (!) 119/104   Pulse (!) 118   Temp 98.2 F (36.8 C) (Oral)   Resp (!) 25   Ht 5' (1.524 m)   Wt 84 kg (185 lb 3 oz)   SpO2 100%   BMI 36.17 kg/m   HEMODYNAMICS: CVP:  [9 mmHg] 9 mmHg  VENTILATOR  SETTINGS: Vent Mode: PRVC FiO2 (%):  [40 %] 40 % Set Rate:  [18 bmp] 18 bmp Vt Set:  [400 mL] 400 mL PEEP:  [5 cmH20] 5 cmH20 Pressure Support:  [5 cmH20] 5 cmH20 Plateau Pressure:  [15 cmH20-22 cmH20] 17 cmH20  INTAKE / OUTPUT: I/O last 3 completed shifts: In: 4043.1 [I.V.:2888.7; IV Piggyback:1154.3] Out: 1540 [GQQPY:1950; Emesis/NG output:300; Stool:1]  PHYSICAL EXAMINATION: General:   Critically ill female resting in bed HEENT: [place]/AT, PERRL, no appreciable JVD. Jaundiced sclera  Neuro: agitated intermittently. Does not open eyes. No meaningful response.  CV: IRIR, +1 pulses. 2+ peripheral edema.  PULM: Clear bilateral breath sounds GI: obese, NT Extremities: Lower extremities cool. Dopplerable pulses.  Skin: no rashes, no sites of bleeding noted  LABS:  BMET Recent Labs  Lab 06/30/18 1959 07/01/18 0341 07/02/18 0451  NA 144 143 138  K 2.5* 4.0 3.5  CL 114* 116* 109  CO2 23 23 21*  BUN 28* 24* 22  CREATININE 1.07* 0.93 0.97  GLUCOSE 168* 181* 173*    Electrolytes Recent Labs  Lab 06/29/18 1605  06/30/18 1959 07/01/18 0341 07/02/18 0451  CALCIUM 9.3   < > 8.0* 8.1* 7.7*  MG 2.1  --   --  1.7  --   PHOS  --   --   --  1.3*  --    < > = values in this interval not  displayed.    CBC Recent Labs  Lab 07/01/18 0341  07/02/18 0451  07/02/18 1409 07/02/18 2037 07/03/18 0424  WBC 30.5*  --  46.0*  --   --   --  46.7*  HGB 9.4*   < > 10.6*   < > 11.3* 10.7* 9.9*  HCT 34.0*   < > 37.9   < > 40.0 36.6 33.3*  PLT 244  --  197  --   --   --  135*   < > = values in this interval not displayed.    Coag's Recent Labs  Lab 06/29/18 2302 06/30/18 1331 07/01/18 0341  INR 7.88* 3.37 2.87    Sepsis Markers Recent Labs  Lab 06/29/18 1244 06/29/18 1605 06/29/18 2035 06/30/18 1959  LATICACIDVEN 10.8* 8.3* 4.6*  --   PROCALCITON  --   --   --  0.31    ABG Recent Labs  Lab 06/29/18 1606 06/30/18 0510  PHART 7.263* 7.471*  PCO2ART 35.2 32.2   PO2ART 333.0* 98.1    Liver Enzymes Recent Labs  Lab 06/30/18 0329 07/01/18 0341 07/02/18 0451  AST 3,278* 1,281* 493*  ALT 2,361* 1,725* 1,311*  ALKPHOS 318* 271* 256*  BILITOT 2.8* 3.3* 3.4*  ALBUMIN 2.7* 2.4*  2.4* 2.1*    Cardiac Enzymes Recent Labs  Lab 06/29/18 1605 06/29/18 2035 06/30/18 0211  TROPONINI 0.20* 0.28* 0.26*    Glucose Recent Labs  Lab 07/02/18 1143 07/02/18 1636 07/02/18 2013 07/02/18 2332 07/03/18 0353 07/03/18 0805  GLUCAP 124* 157* 143* 130* 110* 103*    Imaging  STUDIES:  CT Head 7/9 >> atrophy with small vessel chronic ischemic changes of deep cerebral white matter CT Renal Study 7/9 >> no acute findings, bilateral non-obstructing renal stones ECHO 7/9 >> LV mildly dilated, wall thickness was increased in a pattern of moderate LVH, LVEF 30-35%, severe hypokinesis of the anteroseptal, anterior, inferoseptal & apical myocardium, mild to moderate MV regurgitation, LA severely dilated, RV moderately dilated, RA mod-severely dilated, PA pressure 7mHg, trivial pericardial effusion. CTA Head, Neck 7/12 >> minimal calcified plaque at the carotid bifurcation regions but no stenosis or irregularity, no large vessel occlusions, unable to specifically identify the missing branch vessel responsible for the right posterior frontal infarction but this is presumably an occluded M3 or distal branch CT Head w/o 7/12 >> the small right middle cerebral artery distribution, posterolateral frontal lobe infarct has evolved since the prior CT, it is now seen as a well defined area of hypoattenuation, no new areas of infarct, no ICH CT head 7/15 >> Redemonstration of right posterior frontal lobe infarct. No acute intracranial hemorrhage or new infarct identified.  Atrophy with chronic small vessel ischemia. CT abd/pelvis 7/18 > Diffuse dilation of the colon with large amount of ascending colonic stool.   CULTURES: BCx2 7/10 >> negative   ANTIBIOTICS: Vanco  7/15 >>  Zoysn 7/15 >>   SIGNIFICANT EVENTS: 7/09  Admit  7/15  PCCM consulted with AMS, acute liver failure  LINES/TUBES: ETT 7/15 >>  DISCUSSION: 62y/o F admitted with weakness, decreased intake and lethargy.  Work [place] concerning for [place]H, [place]  She developed AMS > small infarct thought not related to AMS.  Recommended for anticoagulation by Cardiology/Neurology, was transitioned from heparin to eliquis.  The patient deteriorated on 7/15 with a rise in LFT's, AKI, confusion/AMS, worsening of tardive dyskinesia type symptoms.  Transferred to ICU.    ASSESSMENT / PLAN:  PULMONARY A: Acute Respiratory Insufficiency - in setting  of liver failure  Bilateral Pleural Effusions - suspect in setting of liver disease, HF, AKI P:   Weaning 5/5 and tol well.  VAP bundle. Mental status is barrier to further weaning.   CARDIOVASCULAR A:  HFrEF A. fib with RVR HTN - mildly elevated troponin's; EKG unchanged  P:  Continue tele monitoring Cardiology following, appreciate assistance Lopressor IV and digoxin started by cardiology  Hold further anticoagulation in setting of hepatic failure coagulopathy.   RENAL A:   AKI - in setting of liver injury, NASH Hypernatremia Hypokalemia  Hypervolemia 16L pos P:   Appreciate Renal assistance Holding free water Replace mag, Phos today.  Consider diuresis/albumin Trend BMP / mag/ phos/ daily/ urinary output  GASTROINTESTINAL A:   [place] Cirrhosis with Hepatic Encephalopathy Hepatic dysfunction/ shock  Suspected UGIB GERD Ileus - unclear in what precipitated change in events LFTs/ decompensation 7/15 - 7/15 US liver w/doppler with no evidence of portal venous thrombosis   P:   Holding TF Will hold lactulose today.  Trend ammonia. Trend LFT and coag. NGT to LIS. NPO. If ammonia worsening with having to hold lactulose, may have to call GI back or give neostigmine to try to kick start bowels.  Surgery following. No indication for  intervention.  HEMATOLOGIC A:   Iron Deficiency Anemia  Bleeding risk  Hx Breast Cancer - s/p 3 units FFP 7/16 am P:  INR in morning Transfuse for hgb < 7 Monitor for bleeding SCDs only    INFECTIOUS A:   Leukocytosis Rule Out Sepsis - SBP?, other etiology  - remains afebrile P:   Follow cultures Continue empiric vanc / zosyn for now Ideally would dc vanco, but WBC remain very high and still climbing. Etiology uncertain.  Trend WBC/ fever curve   ENDOCRINE A:   DM 2 Hypoglycemia  P:   CBG q 4 SSI  NEUROLOGIC A:   Acute metabolic encephalopathy Acute delirium Right Frontal Operculum infarct - felt related to AF/embolic but not all cause of mental status Right-sided weakness Seizure disorder Schizophrenia P: RASS goal: 0 to -1 Continue PRN sedation Hold Buspar, Effexor > restart once acute phase illness resolved as can withdrawal. Restart Keppra now that Na improved.   FAMILY  - Updates:   Georgann Housekeeper, AGACNP-BC [facility] Pulmonology/Critical Care Pager [phone] or [phone]  07/03/2018 9:09 AM

## 2018-07-03 NOTE — Progress Notes (Signed)
Nutrition Follow-up  DOCUMENTATION CODES:   Obesity unspecified  INTERVENTION:   Recommend initiation of TPN vs attempting re-initiation of trickle TF via post-pyloric Cortrak Tube. Pt with inadequate nutrition x 10 days.   Consider addition of reglan to promote gut motility  NUTRITION DIAGNOSIS:   Inadequate oral intake related to acute illness as evidenced by NPO status   GOAL:   Patient will meet greater than or equal to 90% of their needs  Not Met  MONITOR:   Vent status, TF tolerance, Labs, Weight trends  REASON FOR ASSESSMENT:   Consult, Ventilator Enteral/tube feeding initiation and management  ASSESSMENT:   62 yo female admitted 7/9 with weakness, anorexia and lethargy with NASH cirrhosis with hepatic encephalopathy, new onset Afib with RVR, new onset HFrEF 30-35%, anemia. On 7/15,  pt's condition worsened with increased confusion/lethargy, tardive dyskinesia symptoms, AKI, shock liver and respiratory failure requiring intubation.  Pt with hx of HTN, HLD, seizure disorder, left breast cancer s/p lumpectomy 2008, GERD, depression, DM   Inadequate nutrition x 10 days Trickle TF started on 7/17 but did not tolerate with vomiting 7/18 CT abdomen: colonic ileus with stool in ascending colon OG tube to LIS with 100 mL dark output Abdomen obese/distneded in appearance but soft, BS present, +flatus Per Chart pt with medium BM yesterday and a BM (unknown size) during night shift. Per RN today, stool yesterday was minimal (smear) Evaluated by surgery with no plans for intervention  Discussed nutrition concerns for [facility] NP/CCM, plan to discuss with MD  Pt remains on vent support, not sedated, minimally responsive  Labs: BUN 34, Creatinine 1.45, phosphorus wdl, potassium wdl Meds: lactulose (being held)  Diet Order:   Diet Order           Diet NPO time specified  Diet effective now          EDUCATION NEEDS:   Education needs have been addressed  Skin:  Skin  Assessment: Reviewed RN Assessment  Last BM:  7/18  Height:   Ht Readings from Last 1 Encounters:  07/01/18 5' (1.524 m)    Weight:   Wt Readings from Last 1 Encounters:  07/03/18 185 lb 3 oz (84 kg)    Ideal Body Weight:  50 kg  BMI:  Body mass index is 36.17 kg/m.  Estimated Nutritional Needs:   Kcal:  1000-1200 kcals; 1151 kcals (70% [employer])  Protein:  100-115 g  Fluid:  >/= 1.4 L   Kerman Passey MS, RD, LDN, CNSC [phone] Pager  [phone] Weekend/On-Call Pager

## 2018-07-03 NOTE — Progress Notes (Signed)
Progress Note  Patient Name: Brandi [name] Date of Encounter: 07/03/2018  Primary Cardiologist: Mertie Moores, MD   Subjective   Intubated   Sedated    Inpatient Medications    Scheduled Meds: . chlorhexidine gluconate (MEDLINE KIT)  15 mL Mouth Rinse BID  . Chlorhexidine Gluconate Cloth  6 each Topical Daily  . digoxin  0.25 mg Intravenous Daily  . feeding supplement (VITAL HIGH PROTEIN)  1,000 mL Per Tube Q24H  . fentaNYL (SUBLIMAZE) injection  100 mcg Intravenous Once  . free water  200 mL Per Tube Q8H  . insulin aspart  0-9 Units Subcutaneous Q4H  . lactulose  300 mL Rectal BID  . mouth rinse  15 mL Mouth Rinse 10 times per day  . metoprolol tartrate  5 mg Intravenous Q3H   Continuous Infusions: . lacosamide (VIMPAT) IV 70 mL/hr at 07/03/18 1100  . levETIRAcetam 1,000 mg (07/03/18 1224)  . norepinephrine (LEVOPHED) Adult infusion Stopped (06/29/18 1618)  . piperacillin-tazobactam (ZOSYN)  IV Stopped (07/03/18 7619)  . vancomycin 200 mL/hr at 07/02/18 2200   PRN Meds: Vital Signs    Vitals:   07/03/18 1000 07/03/18 1100 07/03/18 1142 07/03/18 1203  BP: (!) 116/100 (!) 112/96    Pulse: (!) 106 (!) 104  (!) 108  Resp: (!) 23 20  (!) 28  Temp:   98.4 F (36.9 C)   TempSrc:   Oral   SpO2: 92% 96%  98%  Weight:      Height:        Intake/Output Summary (Last 24 hours) at 07/03/2018 1419 Last data filed at 07/03/2018 1100 Gross per 24 hour  Intake 1425.28 ml  Output 741 ml  Net 684.28 ml   Filed Weights   07/01/18 0200 07/02/18 0427 07/03/18 0500  Weight: 176 lb 9.4 oz (80.1 kg) 183 lb 3.2 oz (83.1 kg) 185 lb 3 oz (84 kg)    Telemetry    Atrial fib /probable atrial flutter  110s to 130   Personally reviewed    ECG      Physical Exam   GEN: Patient intubated  Neck: Neck full    Cardiac: RRR Tachycardic   Respiratory: Coarse BS    JK:[id number]  Decreased BS MS: 1+ edema;  Feet cool   Neuro:  INtubated   Sedated    Labs     Chemistry Recent Labs  Lab 07/01/18 0341 07/02/18 0451 07/03/18 0911  NA 143 138 138  K 4.0 3.5 4.2  CL 116* 109 110  CO2 23 21* 17*  GLUCOSE 181* 173* 123*  BUN 24* 22 34*  CREATININE 0.93 0.97 1.45*  CALCIUM 8.1* 7.7* 8.0*  PROT 4.5* 4.0* 4.3*  ALBUMIN 2.4*  2.4* 2.1* 2.1*  AST 1,281* 493* 127*  ALT 1,725* 1,311* 745*  ALKPHOS 271* 256* 270*  BILITOT 3.3* 3.4* 4.3*  GFRNONAA >60 >60 38*  GFRAA >60 >60 44*  ANIONGAP 4* 8 11     Hematology Recent Labs  Lab 07/01/18 0341  07/02/18 0451  07/02/18 1409 07/02/18 2037 07/03/18 0424  WBC 30.5*  --  46.0*  --   --   --  46.7*  RBC 5.06  --  5.81*  --   --   --  5.17*  HGB 9.4*   < > 10.6*   < > 11.3* 10.7* 9.9*  HCT 34.0*   < > 37.9   < > 40.0 36.6 33.3*  MCV 67.2*  --  65.2*  --   --   --  64.4*  MCH 18.6*  --  18.2*  --   --   --  19.1*  MCHC 27.6*  --  28.0*  --   --   --  29.7*  RDW 21.4*  --  21.6*  --   --   --  21.4*  PLT 244  --  197  --   --   --  135*   < > = values in this interval not displayed.    Cardiac Enzymes Recent Labs  Lab 06/29/18 1605 06/29/18 2035 06/30/18 0211  TROPONINI 0.20* 0.28* 0.26*   No results for input(s): TROPIPOC in the last 168 hours.   BNP Recent Labs  Lab 06/29/18 1605  BNP 2,383.4*     DDimer No results for input(s): DDIMER in the last 168 hours.   Radiology    Ct Abdomen Pelvis Wo Contrast  Result Date: 07/02/2018 CLINICAL DATA:  Possible small bowel obstruction. Abdominal stent chin. EXAM: CT ABDOMEN AND PELVIS WITHOUT CONTRAST TECHNIQUE: Multidetector CT imaging of the abdomen and pelvis was performed following the standard protocol without IV contrast. COMPARISON:  CT abdomen pelvis 06/23/2018 Abdominal radiograph 07/02/2018 FINDINGS: LOWER CHEST: Bilateral pleural effusions with associated atelectasis. HEPATOBILIARY: Normal hepatic contours and density. No intra- or extrahepatic biliary dilatation. Small amount of perihepatic ascites. Surgically absent  gallbladder. PANCREAS: Normal parenchymal contours without ductal dilatation. No peripancreatic fluid collection. SPLEEN: Normal. ADRENALS/URINARY TRACT: --Adrenal glands: Normal. --Right kidney/ureter: Multiple nonobstructing renal calculi measuring up to 5 mm. --Left kidney/ureter: Multiple nonobstructing calculi, measuring up to 4 mm. --Urinary bladder: Decompressed by Foley catheter. STOMACH/BOWEL: --Stomach/Duodenum: Nasogastric tube tip at the gastroduodenal junction. --Small bowel: No dilatation or inflammation. --Colon: Diffuse dilatation of the colon with large amount of stool in the ascending colon. The rectum is relatively decompressed. --Appendix: Not visualized. VASCULAR/LYMPHATIC: Normal course and caliber of the major abdominal vessels. No abdominal or pelvic lymphadenopathy. REPRODUCTIVE: Status post hysterectomy. No adnexal mass. MUSCULOSKELETAL. Multilevel degenerative disc disease and facet arthrosis. No bony spinal canal stenosis. OTHER: Anasarca IMPRESSION: 1. Diffuse dilatation of the colon with large amount of ascending colonic stool. 2. No evidence of small-bowel obstruction. 3. Small bilateral pleural effusions, small volume abdominal ascites and anasarca. 4. Bilateral nonobstructing nephrolithiasis. Electronically Signed   By: Ulyses Jarred M.D.   On: 07/02/2018 22:52   Dg Abd Portable 1v  Result Date: 07/02/2018 CLINICAL DATA:  62 year old female with nausea vomiting. EXAM: PORTABLE ABDOMEN - 1 VIEW COMPARISON:  Radiograph dated 07/01/2018 FINDINGS: Partially visualized enteric tube with tip in the epigastric area over the gastric air. Dilated air-filled loops of small bowel as well as severe dilatation of the colon similar to prior radiograph of 07/01/2018. Large amount of fecal content noted in the colon. This may represent a mechanical obstruction or a pseudo obstruction. CT may provide better evaluation. No definite free air identified on the provided images. IMPRESSION: Persistent  dilatation of the small and large bowel with large amount of colonic stool burden. Findings may represent mechanical obstruction or colonic pseudo-obstruction. Clinical correlation is recommended. CT may provide better evaluation. Electronically Signed   By: Anner Crete M.D.   On: 07/02/2018 06:18   Dg Abd Portable 1v  Result Date: 07/01/2018 CLINICAL DATA:  Vomiting today.  Constipation and distension. EXAM: PORTABLE ABDOMEN - 1 VIEW COMPARISON:  One-view chest x-ray 06/29/2017. FINDINGS: There is marked increase in dilated loops of proximal small bowel consistent with progressive obstruction. Extensive stool is present in the ascending colon. There is some  gas in the descending colon. NG tube is in place. IMPRESSION: New prominent distal small bowel obstruction. These results will be called to the ordering clinician or representative by the Radiologist Assistant, and communication documented in the PACS or zVision Dashboard. Electronically Signed   By: San Morelle M.D.   On: 07/01/2018 17:43      Assessment & Plan    1   CHF   CVP is 5  Getting IV fluids (16L positive)  Remains tachycardic   BP is a little better   Urine output is down  Ext are sl cool [place] if  would benefit from [facility] catheter placement  2    Rhythm    Afib / flutter   Rates 100s to 130    Continue on IV b blocker and digoxin  3  GI  CT without mass   Continues on NG suction    Pt remains critically ill  Dorris Carnes MD         For questions or updates, please contact [place] Please consult [email or web address] for contact info under Cardiology/STEMI.      Signed, Dorris Carnes, MD  07/03/2018, 2:19 PM

## 2018-07-03 NOTE — Progress Notes (Addendum)
PULMONARY / CRITICAL CARE MEDICINE   Name: BLANKA ROCKHOLT MRN: [id number] DOB: January 02, 1956    ADMISSION DATE:  06/23/2018 CONSULTATION DATE: 06/29/2018  REFERRING MD: Dr. Bonner Puna  CHIEF COMPLAINT: Altered mental status  HISTORY OF PRESENT ILLNESS:  62 y/o F who presented to [facility] on 7/9 with abdominal pain, nausea, vomiting, decreased oral intake and weakness.    She was reportedly evaluated by her PCP prior to admit with elevated LFT's (AST 302, ALT 611, AP 380).  Follow up CT of the abdomen demonstrated prominent caudate lobe and surface irregularity of the liver.   In the ER, US of the abdomen confirmed the CT findings of the liver.  She was noted to be lethargic and have asterixis on exam.  Ammonia 22 (7/10) > 38 (7/12).  The patient had AFwRVR and was treated with cardizem.  She was admitted for further evaluation.  The patient was seen by Cardiology and transitioned to metoprolol for rate control due to decreased LVEF (30-35%). Labs showed a microcytic anemia with ferritin of 9.  GI was consulted for evaluation with recommendations for lactulose and eventual endoscopic evaluation.  She had ongoing encephalopathy and a CT of the head was obtained which showed a right frontal operculum CVA.  Neurology evaluated the patient and felt the stroke was likely related to AF but not the cause of her mental status.  She was recommended anticoagulation and passed a swallow evaluation converting from heparin to eliquis.  On 7/15, her confusion, tardive dyskinesia symptoms and lethargy worsened as well as her LFT's (AST 3942, ALT 1976, ALK Phos 404), renal function and WBC.  PCCM consulted for evaluation.    SUBJECTIVE/INTERVAL  -7/19: Had been receiving fluid challenges for hypotension. 7/20: Early in the a.m. hours drop blood pressure.  Placed on pressors.  Non-anion gap acidosis noted.  Cortisol 29.2 CBC remains elevated blood cell count remains elevated.  VITAL SIGNS: Blood Pressure (Abnormal) 112/96    Pulse (Abnormal) 108   Temperature 98.4 F (36.9 C) (Oral)   Respiration (Abnormal) 28   Height 5' (1.524 m)   Weight 185 lb 3 oz (84 kg)   Oxygen Saturation 98%   Body Mass Index 36.17 kg/m    HEMODYNAMICS: CVP:  [5 mmHg] 5 mmHg  VENTILATOR SETTINGS: Vent Mode: CPAP FiO2 (%):  [40 %] 40 % Set Rate:  [18 bmp] 18 bmp Vt Set:  [400 mL] 400 mL PEEP:  [5 cmH20] 5 cmH20 Pressure Support:  [5 cmH20] 5 cmH20 Plateau Pressure:  [15 cmH20-22 cmH20] 16 cmH20  INTAKE / OUTPUT:  Intake/Output Summary (Last 24 hours) at 07/04/2018 0915 Last data filed at 07/04/2018 0600 Gross per 24 hour  Intake 864.95 ml  Output 375 ml  Net 489.95 ml    PHYSICAL EXAMINATION:  General: This is a 62 year old white female she is currently ventilated on the ventilator.  She is minimally responsive HEENT normocephalic atraumatic no jugular venous distention orally intubated Pulmonary: Coarse scattered rhonchi no accessory muscle use equal chest rise now that ventilator has been readjusted Cardiac: Regular rate and rhythm without murmur rub or gallop Abdomen: Soft currently.  Positive bowel sounds. GU: Clear yellow Neuro: Unresponsive   LABS:  BMET Recent Labs  Lab 07/01/18 0341 07/02/18 0451 07/03/18 0911  NA 143 138 138  K 4.0 3.5 4.2  CL 116* 109 110  CO2 23 21* 17*  BUN 24* 22 34*  CREATININE 0.93 0.97 1.45*  GLUCOSE 181* 173* 123*    Electrolytes Recent Labs  Lab 06/29/18 1605  07/01/18 0341 07/02/18 0451 07/03/18 0911  CALCIUM 9.3   < > 8.1* 7.7* 8.0*  MG 2.1  --  1.7  --  1.7  PHOS  --   --  1.3*  --  3.7   < > = values in this interval not displayed.    CBC Recent Labs  Lab 07/01/18 0341  07/02/18 0451  07/02/18 1409 07/02/18 2037 07/03/18 0424  WBC 30.5*  --  46.0*  --   --   --  46.7*  HGB 9.4*   < > 10.6*   < > 11.3* 10.7* 9.9*  HCT 34.0*   < > 37.9   < > 40.0 36.6 33.3*  PLT 244  --  197  --   --   --  135*   < > = values in this interval not displayed.     Coag's Recent Labs  Lab 06/29/18 2302 06/30/18 1331 07/01/18 0341  INR 7.88* 3.37 2.87    Sepsis Markers Recent Labs  Lab 06/29/18 1244 06/29/18 1605 06/29/18 2035 06/30/18 1959  LATICACIDVEN 10.8* 8.3* 4.6*  --   PROCALCITON  --   --   --  0.31    ABG Recent Labs  Lab 06/29/18 1606 06/30/18 0510  PHART 7.263* 7.471*  PCO2ART 35.2 32.2  PO2ART 333.0* 98.1    Liver Enzymes Recent Labs  Lab 07/01/18 0341 07/02/18 0451 07/03/18 0911  AST 1,281* 493* 127*  ALT 1,725* 1,311* 745*  ALKPHOS 271* 256* 270*  BILITOT 3.3* 3.4* 4.3*  ALBUMIN 2.4*  2.4* 2.1* 2.1*    Cardiac Enzymes Recent Labs  Lab 06/29/18 1605 06/29/18 2035 06/30/18 0211  TROPONINI 0.20* 0.28* 0.26*    Glucose Recent Labs  Lab 07/02/18 1636 07/02/18 2013 07/02/18 2332 07/03/18 0353 07/03/18 0805 07/03/18 1127  GLUCAP 157* 143* 130* 110* 103* 122*    Imaging  STUDIES:  CT Head 7/9 >> atrophy with small vessel chronic ischemic changes of deep cerebral white matter CT Renal Study 7/9 >> no acute findings, bilateral non-obstructing renal stones ECHO 7/9 >> LV mildly dilated, wall thickness was increased in a pattern of moderate LVH, LVEF 30-35%, severe hypokinesis of the anteroseptal, anterior, inferoseptal & apical myocardium, mild to moderate MV regurgitation, LA severely dilated, RV moderately dilated, RA mod-severely dilated, PA pressure 2mHg, trivial pericardial effusion. CTA Head, Neck 7/12 >> minimal calcified plaque at the carotid bifurcation regions but no stenosis or irregularity, no large vessel occlusions, unable to specifically identify the missing branch vessel responsible for the right posterior frontal infarction but this is presumably an occluded M3 or distal branch CT Head w/o 7/12 >> the small right middle cerebral artery distribution, posterolateral frontal lobe infarct has evolved since the prior CT, it is now seen as a well defined area of hypoattenuation, no new  areas of infarct, no ICH CT head 7/15 >> Redemonstration of right posterior frontal lobe infarct. No acute intracranial hemorrhage or new infarct identified.  Atrophy with chronic small vessel ischemia. CT abd/pelvis 7/18 > Diffuse dilation of the colon with large amount of ascending colonic stool.   CULTURES: BCx2 7/10 >> negative  cdiff 7/20>>> Sputum 7/20>>>  ANTIBIOTICS: Vanco 7/15 >>  Zoysn 7/15 >>   SIGNIFICANT EVENTS: 7/09  Admit  7/15  PCCM consulted with AMS, acute liver failure  LINES/TUBES: ETT 7/15 >>  DISCUSSION: 62y/o F admitted with weakness, decreased intake and lethargy.  Work up concerning for NASH, ABonsall  She developed AMS >  small infarct thought not related to AMS.  Recommended for anticoagulation by Cardiology/Neurology, was transitioned from heparin to eliquis.  The patient deteriorated on 7/15 with a rise in LFT's, AKI, confusion/AMS, worsening of tardive dyskinesia type symptoms.  Transferred to ICU.   -Mental status remains major barrier.  We have been unable to treat her hepatic encephalopathy due to ileus.  It seems as of the ileus since improved.  For today the plan will be to resume supportive care in regards to her panic dysfunction.  Initiate spironolactone.  Discontinue vancomycin.  And reattempt's pressure support evaluation now the endotracheal tubes and repositioned  ASSESSMENT / PLAN:   Acute Respiratory Insufficiency - in setting of liver failure  Bilateral Pleural Effusions - suspect in setting of liver disease, HF, AKI Portable chest x-ray reviewed:This demonstrates slightly rotated film.  The tip of the endotracheal tube is in the right mainstem.  She does have right basilar atelectasis as well as left-sided atelectasis comparing this film to the film obtained on 7/17 the right basilar airspace disease looks worse. -The endotracheal tube was pulled back 4 cm following this evaluation, she had failed weaning attempt early this a.m., this may  have contributed to that -Her mental status remains the major barrier to extubation Plan Continue full ventilator support, with daily attempts at spontaneous breathing trial and pressure support ventilation Continue to treat her hepatic encephalopathy, she is unable to protect her airway currently We will obtain sputum culture today given worsening right lower lobe airspace disease Repeat chest x-ray in a.m. 7/21 Limit sedation, RASS goal 0 Continue ventilator associated pneumonia interventions  Acute metabolic & hepatic  encephalopathy/ Acute delirium CVA: Right Frontal Operculum infarct - felt related to AF/embolic but not all cause of mental status Right-sided weakness Seizure disorder Schizophrenia Plan RASS goal 0 Holding BuSpar and Effexor for now, may need to resume this given concern for withdrawal Continue Keppra Resuming lactulose, Adding rifaximin Close observation of blood chemistries avoiding hypernatremia   Ileus seems to have improved as of 7/20 although Now moving bowels as of 7/19 still has significant gas pattern on one view Plan Resume medications via tube Resume trickle tube feeds on 721 if no issues Avoid narcotics Avoid hypokalemia Getting C. difficile eval.  Could this represent toxic megacolon?  HFrEF A. fib with RVR HTN -Had transient hypotension the a.m. of 7/20.  Etiology not entirely clear if this required pressors transiently now hypertensive without pressors - mildly elevated troponin's; EKG unchanged  -cardiology following AC held d/t liver dysfxn Plan Resume diuresis with spironolactone (checking flotrak to assess volume responsiveness-->may end-up holding off on this) Transition Lopressor and digoxin to via tube Holding anticoagulation given hepatic dysfunction and bleeding risk  AKI - in setting of liver injury, NASH Renal function is improving, continues to have non-anion gap metabolic acidosis, she is hemodynamically stable Plan Resume  diuresis as mentioned above, she is over 16 L positive (see above) Renal dose meds F/u chem in am   Fluid and Electrolyte imbalance: hypernatremia, Hypokalemia & volume overload Plan Resume low-dose water replacement Daily chemistries Strict intake and output   [place] Cirrhosis with Hepatic Encephalopathy  - unclear in what precipitated change in events LFTs/ decompensation 7/15 - 7/15 US liver w/doppler with no evidence of portal venous thrombosis  -LFTs improved Plan Trend LFTs every other day Will need GI follow-up  Possible GIB Hemoglobin stable Plan Continue PPI   Leukocytosis, questionable sepsis however etiology unclear.  Question of spontaneous bacterial peritonitis -White  blood cell count continues to climb -Culture data negative, no significant fever spike Plan Day #6 vancomycin and Zosyn.  Repeat sputum culture as mentioned above Will check cdiff   Iron Deficiency Anemia  Plan Follow-up serial CBCs  Coagulopathy  - s/p 3 units FFP 7/16 am Plan Every other day INR  Hx Breast Cancer Plan Follow-up outpatient    DM 2 Plan Sliding scale insulin   DVT prophylaxis: scd SUP: ppi Diet: NPO Activity: BR Disposition : ICU  FAMILY  - Updates: Pending  Erick Colace ACNP-BC [place] Pager # [phone] OR # [phone] if no answer

## 2018-07-03 NOTE — Progress Notes (Signed)
SLP Cancellation Note  Patient Details Name: Brandi [name] MRN: [id number] DOB: 1955-12-28   Cancelled treatment:       Reason Eval/Treat Not Completed: Medical issues which prohibited therapy, sign off for now, please reorder when appropriate.    Rosezella Kronick, Katherene Ponto 07/03/2018, 7:25 AM

## 2018-07-04 ENCOUNTER — Inpatient Hospital Stay (HOSPITAL_COMMUNITY): Payer: Medicare Other

## 2018-07-04 LAB — CULTURE, BLOOD (ROUTINE X 2)
CULTURE: NO GROWTH
Culture: NO GROWTH
SPECIAL REQUESTS: ADEQUATE

## 2018-07-04 LAB — COMPREHENSIVE METABOLIC PANEL
ALK PHOS: 203 U/L — AB (ref 38–126)
ALT: 452 U/L — ABNORMAL HIGH (ref 0–44)
ANION GAP: 8 (ref 5–15)
AST: 73 U/L — AB (ref 15–41)
Albumin: 2.1 g/dL — ABNORMAL LOW (ref 3.5–5.0)
BILIRUBIN TOTAL: 3.4 mg/dL — AB (ref 0.3–1.2)
BUN: 40 mg/dL — AB (ref 8–23)
CALCIUM: 7.9 mg/dL — AB (ref 8.9–10.3)
CO2: 24 mmol/L (ref 22–32)
Chloride: 110 mmol/L (ref 98–111)
Creatinine, Ser: 1.41 mg/dL — ABNORMAL HIGH (ref 0.44–1.00)
GFR calc Af Amer: 45 mL/min — ABNORMAL LOW (ref >60)
GFR calc non Af Amer: 39 mL/min — ABNORMAL LOW (ref >60)
GLUCOSE: 152 mg/dL — AB (ref 70–99)
POTASSIUM: 4 mmol/L (ref 3.5–5.1)
SODIUM: 142 mmol/L (ref 135–145)
Total Protein: 3.8 g/dL — ABNORMAL LOW (ref 6.5–8.1)

## 2018-07-04 LAB — CBC WITH DIFFERENTIAL/PLATELET
BASOS ABS: 0 10*3/uL (ref 0.0–0.1)
Basophils Relative: 0 %
EOS ABS: 0 10*3/uL (ref 0.0–0.7)
Eosinophils Relative: 0 %
HEMATOCRIT: 27.2 % — AB (ref 36.0–46.0)
HEMOGLOBIN: 7.8 g/dL — AB (ref 12.0–15.0)
LYMPHS PCT: 4 %
Lymphs Abs: 1.2 10*3/uL (ref 0.7–4.0)
MCH: 18.5 pg — ABNORMAL LOW (ref 26.0–34.0)
MCHC: 28.7 g/dL — AB (ref 30.0–36.0)
MCV: 64.5 fL — ABNORMAL LOW (ref 78.0–100.0)
MONOS PCT: 10 %
Monocytes Absolute: 3 10*3/uL — ABNORMAL HIGH (ref 0.1–1.0)
NEUTROS ABS: 25.6 10*3/uL — AB (ref 1.7–7.7)
Neutrophils Relative %: 86 %
Platelets: DECREASED 10*3/uL (ref 150–400)
RBC: 4.22 MIL/uL (ref 3.87–5.11)
RDW: 21.5 % — ABNORMAL HIGH (ref 11.5–15.5)
WBC: 29.8 10*3/uL — AB (ref 4.0–10.5)

## 2018-07-04 LAB — CBC
HCT: 27.6 % — ABNORMAL LOW (ref 36.0–46.0)
Hemoglobin: 8.2 g/dL — ABNORMAL LOW (ref 12.0–15.0)
MCH: 18.9 pg — AB (ref 26.0–34.0)
MCHC: 29.7 g/dL — ABNORMAL LOW (ref 30.0–36.0)
MCV: 63.6 fL — AB (ref 78.0–100.0)
Platelets: 132 10*3/uL — ABNORMAL LOW (ref 150–400)
RBC: 4.34 MIL/uL (ref 3.87–5.11)
RDW: 22 % — ABNORMAL HIGH (ref 11.5–15.5)
WBC: 31.6 10*3/uL — AB (ref 4.0–10.5)

## 2018-07-04 LAB — POCT I-STAT 3, ART BLOOD GAS (G3+)
Acid-Base Excess: 2 mmol/L (ref 0.0–2.0)
BICARBONATE: 24.8 mmol/L (ref 20.0–28.0)
O2 Saturation: 96 %
PH ART: 7.531 — AB (ref 7.350–7.450)
PO2 ART: 73 mmHg — AB (ref 83.0–108.0)
Patient temperature: 99.5
TCO2: 26 mmol/L (ref 22–32)
pCO2 arterial: 29.8 mmHg — ABNORMAL LOW (ref 32.0–48.0)

## 2018-07-04 LAB — CORTISOL: Cortisol, Plasma: 29.2 ug/dL

## 2018-07-04 LAB — PROTIME-INR
INR: 1.82
Prothrombin Time: 20.9 seconds — ABNORMAL HIGH (ref 11.4–15.2)

## 2018-07-04 LAB — DIC (DISSEMINATED INTRAVASCULAR COAGULATION)PANEL
D-Dimer, Quant: 7.39 ug/mL-FEU — ABNORMAL HIGH (ref 0.00–0.50)
Fibrinogen: 392 mg/dL (ref 210–475)
INR: 1.86
Platelets: 136 10*3/uL — ABNORMAL LOW (ref 150–400)
Prothrombin Time: 21.3 seconds — ABNORMAL HIGH (ref 11.4–15.2)

## 2018-07-04 LAB — LACTIC ACID, PLASMA: Lactic Acid, Venous: 2 mmol/L (ref 0.5–1.9)

## 2018-07-04 LAB — GLUCOSE, CAPILLARY
GLUCOSE-CAPILLARY: 149 mg/dL — AB (ref 70–99)
GLUCOSE-CAPILLARY: 148 mg/dL — AB (ref 70–99)
Glucose-Capillary: 147 mg/dL — ABNORMAL HIGH (ref 70–99)
Glucose-Capillary: 134 mg/dL — ABNORMAL HIGH (ref 70–99)

## 2018-07-04 LAB — DIC (DISSEMINATED INTRAVASCULAR COAGULATION) PANEL
APTT: 22 s — AB (ref 24–36)
SMEAR REVIEW: NONE SEEN

## 2018-07-04 MED ORDER — LACTULOSE 10 GM/15ML PO SOLN
30.0000 g | Freq: Three times a day (TID) | ORAL | Status: DC
  Administered 2018-07-04 – 2018-07-10 (×21): 30 g
  Filled 2018-07-04 (×22): qty 45

## 2018-07-04 MED ORDER — PANTOPRAZOLE SODIUM 40 MG PO PACK
40.0000 mg | PACK | Freq: Every day | ORAL | Status: DC
  Administered 2018-07-04 – 2018-07-11 (×8): 40 mg
  Filled 2018-07-04 (×8): qty 20

## 2018-07-04 MED ORDER — SPIRONOLACTONE 50 MG PO TABS
50.0000 mg | ORAL_TABLET | Freq: Every day | ORAL | Status: DC
  Administered 2018-07-04 – 2018-07-10 (×7): 50 mg
  Filled 2018-07-04 (×7): qty 1

## 2018-07-04 MED ORDER — SODIUM BICARBONATE 8.4 % IV SOLN
INTRAVENOUS | Status: AC
  Administered 2018-07-04: 100 meq
  Filled 2018-07-04: qty 100

## 2018-07-04 MED ORDER — METOPROLOL TARTRATE 25 MG/10 ML ORAL SUSPENSION
50.0000 mg | Freq: Two times a day (BID) | ORAL | Status: DC
  Administered 2018-07-04 – 2018-07-06 (×6): 50 mg
  Filled 2018-07-04 (×5): qty 20

## 2018-07-04 MED ORDER — RIFAXIMIN 550 MG PO TABS
550.0000 mg | ORAL_TABLET | Freq: Three times a day (TID) | ORAL | Status: DC
  Administered 2018-07-04 – 2018-07-12 (×26): 550 mg
  Filled 2018-07-04 (×26): qty 1

## 2018-07-04 MED ORDER — ALBUMIN HUMAN 25 % IV SOLN
12.5000 g | Freq: Once | INTRAVENOUS | Status: AC
  Administered 2018-07-04: 12.5 g via INTRAVENOUS
  Filled 2018-07-04: qty 50

## 2018-07-04 NOTE — Progress Notes (Signed)
At 01:30 patients blood pressure noted to me in the 80S systolic. NP made aware and at bed side. A-line not pulling back blood and waveform dampened. 2 amps of bicarb given 12.5g of albumin given and levophed started. ABG drawn. Multiple attempts for new aline failed. Will continue to monitor.

## 2018-07-04 NOTE — Progress Notes (Signed)
Pt weaned for 20 minutes on PS 5/ PEEP +5. RT placed pt back on full support d/t sustained tachypnea, increased BP and agitation. RN aware and VS stable. RT will continue to monitor.

## 2018-07-04 NOTE — Progress Notes (Signed)
Progress Note  Patient Name: Brandi [name] Date of Encounter: 07/04/2018  Primary Cardiologist: Mertie Moores, MD   Subjective   Intubated and sedated.  Inpatient Medications    Scheduled Meds: . chlorhexidine gluconate (MEDLINE KIT)  15 mL Mouth Rinse BID  . Chlorhexidine Gluconate Cloth  6 each Topical Daily  . digoxin  0.25 mg Intravenous Daily  . feeding supplement (VITAL HIGH PROTEIN)  1,000 mL Per Tube Q24H  . free water  200 mL Per Tube Q8H  . insulin aspart  0-9 Units Subcutaneous Q4H  . lactulose  30 g Per Tube TID  . mouth rinse  15 mL Mouth Rinse 10 times per day  . metoprolol tartrate  50 mg Per Tube BID  . rifaximin  550 mg Per Tube TID  . spironolactone  50 mg Per Tube Daily   Continuous Infusions: . lacosamide (VIMPAT) IV 100 mg (07/04/18 0940)  . levETIRAcetam Stopped (07/04/18 0356)  . piperacillin-tazobactam (ZOSYN)  IV 12.5 mL/hr at 07/04/18 0600   PRN Meds: Vital Signs    Vitals:   07/04/18 0732 07/04/18 0734 07/04/18 0800 07/04/18 0839  BP: (!) 145/68 (!) 151/67 (!) 116/53   Pulse: (!) 106 (!) 108 (!) 119 (!) 103  Resp: (!) 28 (!) 30 (!) 26 (!) 29  Temp:   97.8 F (36.6 C)   TempSrc:   Oral   SpO2: 100% 100% 99% 100%  Weight:      Height:  '5\' 2"'  (1.575 m)      Intake/Output Summary (Last 24 hours) at 07/04/2018 1035 Last data filed at 07/04/2018 0600 Gross per 24 hour  Intake 848.36 ml  Output 375 ml  Net 473.36 ml   Filed Weights   07/02/18 0427 07/03/18 0500 07/04/18 0426  Weight: 183 lb 3.2 oz (83.1 kg) 185 lb 3 oz (84 kg) 188 lb 15 oz (85.7 kg)    Telemetry    Atrial fib in the low 100s  ECG   no new ECGs  Physical Exam   GEN: Patient intubated  Neck: Neck with central line in L IJ. Difficult to assess JVD 2/2 boday habitus Cardiac: slightly irregular, tachycardic in low 100s Respiratory: Coarse BS    GI: Distended  Decreased BS MS: 1+ edema;  Feet warm, hands warm though slightly cooler than feet. No  mottling. Neuro:  Intubated   Sedated. Moving all limbs independently in bed.  Labs    Chemistry Recent Labs  Lab 07/02/18 0451 07/03/18 0911 07/04/18 0234  NA 138 138 142  K 3.5 4.2 4.0  CL 109 110 110  CO2 21* 17* 24  GLUCOSE 173* 123* 152*  BUN 22 34* 40*  CREATININE 0.97 1.45* 1.41*  CALCIUM 7.7* 8.0* 7.9*  PROT 4.0* 4.3* 3.8*  ALBUMIN 2.1* 2.1* 2.1*  AST 493* 127* 73*  ALT 1,311* 745* 452*  ALKPHOS 256* 270* 203*  BILITOT 3.4* 4.3* 3.4*  GFRNONAA >60 38* 39*  GFRAA >60 44* 45*  ANIONGAP '8 11 8     ' Hematology Recent Labs  Lab 07/03/18 0424 07/04/18 0234 07/04/18 0345 07/04/18 0352  WBC 46.7* 29.8* 31.6*  --   RBC 5.17* 4.22 4.34  --   HGB 9.9* 7.8* 8.2*  --   HCT 33.3* 27.2* 27.6*  --   MCV 64.4* 64.5* 63.6*  --   MCH 19.1* 18.5* 18.9*  --   MCHC 29.7* 28.7* 29.7*  --   RDW 21.4* 21.5* 22.0*  --   PLT 135*  PLATELET CLUMPS NOTED ON SMEAR, COUNT APPEARS DECREASED 132* 136*    Cardiac Enzymes Recent Labs  Lab 06/29/18 1605 06/29/18 2035 06/30/18 0211  TROPONINI 0.20* 0.28* 0.26*   No results for input(s): TROPIPOC in the last 168 hours.   BNP Recent Labs  Lab 06/29/18 1605  BNP 2,383.4*     DDimer  Recent Labs  Lab 07/04/18 0352  DDIMER 7.39*     Radiology    Ct Abdomen Pelvis Wo Contrast  Result Date: 07/02/2018 CLINICAL DATA:  Possible small bowel obstruction. Abdominal stent chin. EXAM: CT ABDOMEN AND PELVIS WITHOUT CONTRAST TECHNIQUE: Multidetector CT imaging of the abdomen and pelvis was performed following the standard protocol without IV contrast. COMPARISON:  CT abdomen pelvis 06/23/2018 Abdominal radiograph 07/02/2018 FINDINGS: LOWER CHEST: Bilateral pleural effusions with associated atelectasis. HEPATOBILIARY: Normal hepatic contours and density. No intra- or extrahepatic biliary dilatation. Small amount of perihepatic ascites. Surgically absent gallbladder. PANCREAS: Normal parenchymal contours without ductal dilatation. No  peripancreatic fluid collection. SPLEEN: Normal. ADRENALS/URINARY TRACT: --Adrenal glands: Normal. --Right kidney/ureter: Multiple nonobstructing renal calculi measuring up to 5 mm. --Left kidney/ureter: Multiple nonobstructing calculi, measuring up to 4 mm. --Urinary bladder: Decompressed by Foley catheter. STOMACH/BOWEL: --Stomach/Duodenum: Nasogastric tube tip at the gastroduodenal junction. --Small bowel: No dilatation or inflammation. --Colon: Diffuse dilatation of the colon with large amount of stool in the ascending colon. The rectum is relatively decompressed. --Appendix: Not visualized. VASCULAR/LYMPHATIC: Normal course and caliber of the major abdominal vessels. No abdominal or pelvic lymphadenopathy. REPRODUCTIVE: Status post hysterectomy. No adnexal mass. MUSCULOSKELETAL. Multilevel degenerative disc disease and facet arthrosis. No bony spinal canal stenosis. OTHER: Anasarca IMPRESSION: 1. Diffuse dilatation of the colon with large amount of ascending colonic stool. 2. No evidence of small-bowel obstruction. 3. Small bilateral pleural effusions, small volume abdominal ascites and anasarca. 4. Bilateral nonobstructing nephrolithiasis. Electronically Signed   By: Ulyses Jarred M.D.   On: 07/02/2018 22:52   Dg Chest Port 1 View  Result Date: 07/04/2018 CLINICAL DATA:  Intubated. EXAM: PORTABLE CHEST 1 VIEW COMPARISON:  07/01/2018. FINDINGS: The endotracheal tube tip is in the right mainstem bronchus. Nasogastric tube extending into the stomach. Left jugular catheter tip at the superior cavoatrial junction. Enlarged cardiac silhouette with improvement. Stable prominent interstitial markings without pleural fluid. Cholecystectomy clips. Unremarkable bones. IMPRESSION: 1. The endotracheal tube tip is in the right mainstem bronchus. It is recommended that this be retracted 6 cm. 2. Mildly improved cardiomegaly. 3. Stable mild chronic interstitial lung disease. These results will be called to the ordering  clinician or representative by the Radiologist Assistant, and communication documented in the PACS or zVision Dashboard. Electronically Signed   By: Claudie Revering M.D.   On: 07/04/2018 09:24   Dg Abd Portable 1v  Result Date: 07/04/2018 CLINICAL DATA:  Followup colonic ileus. EXAM: PORTABLE ABDOMEN - 1 VIEW COMPARISON:  Abdomen and pelvis CT dated 07/02/2018. FINDINGS: There has been mild improvement in previously demonstrated dilated colon. Previously seen prominent small bowel loops are no longer demonstrated. Nasogastric tube tip in the region of the gastric pylorus and side hole in the mid stomach. Cholecystectomy clips. Lower lumbar spine degenerative changes. IMPRESSION: Mildly improved colonic ileus. Electronically Signed   By: Claudie Revering M.D.   On: 07/04/2018 09:28   Assessment & Plan    1  Acute systolic and diastolic heart failure -CVP is discordant; has been very low (<5) despite many liters of IV fluids. She is third spacing, but difficult to accept that she doesn't  have some intravascular volume. BP normalized today, was briefly hypotensive. Her tachycardia is somewhat better today, and her extremities are warm.  -will try local tPA to catheter to see if there is an issue with a clot on the tubing damping the CVP. If it is still unclear as to what her volume status is, can consider right heart cath on Monday or Swan-Ganz at the bedside.  2    Rhythm: Afib / flutter, >100 -improved slightly today, continue on IV b blocker and digoxin -hold anticoagulation given liver dysfunction and bleeding risk  Pt remains critically ill. Had large BM yesterday, ileus improving. Unclear how much of her current status is due to her heart  For questions or updates, please contact [place] Please consult [email or web address] for contact info under Cardiology/STEMI.   Signed, Buford Dresser, MD  07/04/2018, 10:35 AM

## 2018-07-04 NOTE — Progress Notes (Signed)
OT Cancellation Note  Patient Details Name: Brandi [name] MRN: [id number] DOB: Oct 03, 1956   Cancelled Treatment:    Reason Eval/Treat Not Completed: Medical issues which prohibited therapy. Pt remains intubated and not medically stable to participate in OT. Will sign off and await new order when approrpiate.   Norman Herrlich, MS OTR/L  Pager: [phone]   Norman Herrlich 07/04/2018, 2:57 PM

## 2018-07-04 NOTE — Procedures (Signed)
Arterial Catheter Insertion Procedure Note Brandi [name] [id number] 08/13/1956  Procedure: Insertion of Arterial Catheter  Indications: Blood pressure monitoring and Frequent blood sampling Pt has a right femoral catheter that has a waveform but is positional and has no drawback NP Eubanks attempted a left femoral catheter with US guidance however unable to advance the gudewire due to resistance.   Procedure Details Consent: Unable to obtain consent because of emergent medical necessity. Time Out: Verified patient identification, verified procedure, site/side was marked, verified correct patient position, special equipment/implants available, medications/allergies/relevent history reviewed, required imaging and test results available.  Performed  Maximum sterile technique was used including antiseptics, cap, gloves, gown, hand hygiene, mask and sheet. Skin prep: Chlorhexidine; local anesthetic administered 20 gauge catheter was inserted into left axillary  using the Seldinger technique. ULTRASOUND GUIDANCE USED: YES  Evaluation Unsuccessful attempt Blood flow good; BP tracing poor. Unable to drawback Removed immediately  Complications: No apparent complications.   Rise Paganini Scatliffe 07/04/2018

## 2018-07-04 NOTE — Progress Notes (Signed)
Patient ID: Brandi [name], female   DOB: 23-Sep-1956, 62 y.o.   MRN: [id number]       Subjective: Pt still on vent.  Had a large BM yesterday.  NGT with essentially minimal to no output.  Objective: Vital signs in last 24 hours: Temp:  [97.4 F (36.3 C)-99.5 F (37.5 C)] 97.8 F (36.6 C) (07/20 0800) Pulse Rate:  [76-119] 103 (07/20 0839) Resp:  [17-30] 29 (07/20 0839) BP: (58-151)/(29-96) 116/53 (07/20 0800) SpO2:  [91 %-100 %] 100 % (07/20 0839) Arterial Line BP: (48-167)/(42-84) 116/53 (07/20 0800) FiO2 (%):  [40 %-50 %] 40 % (07/20 0839) Weight:  [85.7 kg (188 lb 15 oz)] 85.7 kg (188 lb 15 oz) (07/20 0426) Last BM Date: 07/03/18  Intake/Output from previous day: 07/19 0701 - 07/20 0700 In: 889.3 [I.V.:245.2; IV Piggyback:614.1] Out: 375 [Urine:375] Intake/Output this shift: No intake/output data recorded.  PE: Abd: soft, obese, some BS, NGT with essentially no output.    Lab Results:  Recent Labs    07/04/18 0234 07/04/18 0345 07/04/18 0352  WBC 29.8* 31.6*  --   HGB 7.8* 8.2*  --   HCT 27.2* 27.6*  --   PLT PLATELET CLUMPS NOTED ON SMEAR, COUNT APPEARS DECREASED 132* 136*   BMET Recent Labs    07/03/18 0911 07/04/18 0234  NA 138 142  K 4.2 4.0  CL 110 110  CO2 17* 24  GLUCOSE 123* 152*  BUN 34* 40*  CREATININE 1.45* 1.41*  CALCIUM 8.0* 7.9*   PT/INR Recent Labs    07/04/18 0234 07/04/18 0352  LABPROT 20.9* 21.3*  INR 1.82 1.86   CMP     Component Value Date/Time   NA 142 07/04/2018 0234   NA 138 06/22/2018 1350   NA 144 10/24/2014 1336   K 4.0 07/04/2018 0234   K 3.5 10/24/2014 1336   CL 110 07/04/2018 0234   CL 108 (H) 06/24/2013 0610   CL 103 10/22/2012 1343   CO2 24 07/04/2018 0234   CO2 31 (H) 10/24/2014 1336   GLUCOSE 152 (H) 07/04/2018 0234   GLUCOSE 116 10/24/2014 1336   GLUCOSE 153 (H) 10/22/2012 1343   BUN 40 (H) 07/04/2018 0234   BUN 19 06/22/2018 1350   BUN 14.9 10/24/2014 1336   CREATININE 1.41 (H) 07/04/2018 0234     CREATININE 0.77 10/22/2017 1007   CREATININE 0.8 10/24/2014 1336   CALCIUM 7.9 (L) 07/04/2018 0234   CALCIUM 10.7 (H) 10/24/2014 1336   PROT 3.8 (L) 07/04/2018 0234   PROT 5.9 (L) 06/22/2018 1350   PROT 7.6 10/24/2014 1336   ALBUMIN 2.1 (L) 07/04/2018 0234   ALBUMIN 3.8 06/22/2018 1350   ALBUMIN 4.1 10/24/2014 1336   AST 73 (H) 07/04/2018 0234   AST 21 10/24/2014 1336   ALT 452 (H) 07/04/2018 0234   ALT 31 10/24/2014 1336   ALKPHOS 203 (H) 07/04/2018 0234   ALKPHOS 138 10/24/2014 1336   BILITOT 3.4 (H) 07/04/2018 0234   BILITOT 1.1 06/22/2018 1350   BILITOT 0.44 10/24/2014 1336   GFRNONAA 39 (L) 07/04/2018 0234   GFRNONAA >60 06/24/2013 0610   GFRAA 45 (L) 07/04/2018 0234   GFRAA >60 06/24/2013 0610   Lipase     Component Value Date/Time   LIPASE 37 06/23/2018 0119       Studies/Results: Ct Abdomen Pelvis Wo Contrast  Result Date: 07/02/2018 CLINICAL DATA:  Possible small bowel obstruction. Abdominal stent chin. EXAM: CT ABDOMEN AND PELVIS WITHOUT CONTRAST TECHNIQUE: Multidetector CT  imaging of the abdomen and pelvis was performed following the standard protocol without IV contrast. COMPARISON:  CT abdomen pelvis 06/23/2018 Abdominal radiograph 07/02/2018 FINDINGS: LOWER CHEST: Bilateral pleural effusions with associated atelectasis. HEPATOBILIARY: Normal hepatic contours and density. No intra- or extrahepatic biliary dilatation. Small amount of perihepatic ascites. Surgically absent gallbladder. PANCREAS: Normal parenchymal contours without ductal dilatation. No peripancreatic fluid collection. SPLEEN: Normal. ADRENALS/URINARY TRACT: --Adrenal glands: Normal. --Right kidney/ureter: Multiple nonobstructing renal calculi measuring up to 5 mm. --Left kidney/ureter: Multiple nonobstructing calculi, measuring up to 4 mm. --Urinary bladder: Decompressed by Foley catheter. STOMACH/BOWEL: --Stomach/Duodenum: Nasogastric tube tip at the gastroduodenal junction. --Small bowel: No  dilatation or inflammation. --Colon: Diffuse dilatation of the colon with large amount of stool in the ascending colon. The rectum is relatively decompressed. --Appendix: Not visualized. VASCULAR/LYMPHATIC: Normal course and caliber of the major abdominal vessels. No abdominal or pelvic lymphadenopathy. REPRODUCTIVE: Status post hysterectomy. No adnexal mass. MUSCULOSKELETAL. Multilevel degenerative disc disease and facet arthrosis. No bony spinal canal stenosis. OTHER: Anasarca IMPRESSION: 1. Diffuse dilatation of the colon with large amount of ascending colonic stool. 2. No evidence of small-bowel obstruction. 3. Small bilateral pleural effusions, small volume abdominal ascites and anasarca. 4. Bilateral nonobstructing nephrolithiasis. Electronically Signed   By: Ulyses Jarred M.D.   On: 07/02/2018 22:52   Dg Chest Port 1 View  Result Date: 07/04/2018 CLINICAL DATA:  Intubated. EXAM: PORTABLE CHEST 1 VIEW COMPARISON:  07/01/2018. FINDINGS: The endotracheal tube tip is in the right mainstem bronchus. Nasogastric tube extending into the stomach. Left jugular catheter tip at the superior cavoatrial junction. Enlarged cardiac silhouette with improvement. Stable prominent interstitial markings without pleural fluid. Cholecystectomy clips. Unremarkable bones. IMPRESSION: 1. The endotracheal tube tip is in the right mainstem bronchus. It is recommended that this be retracted 6 cm. 2. Mildly improved cardiomegaly. 3. Stable mild chronic interstitial lung disease. These results will be called to the ordering clinician or representative by the Radiologist Assistant, and communication documented in the PACS or zVision Dashboard. Electronically Signed   By: Claudie Revering M.D.   On: 07/04/2018 09:24   Dg Abd Portable 1v  Result Date: 07/04/2018 CLINICAL DATA:  Followup colonic ileus. EXAM: PORTABLE ABDOMEN - 1 VIEW COMPARISON:  Abdomen and pelvis CT dated 07/02/2018. FINDINGS: There has been mild improvement in  previously demonstrated dilated colon. Previously seen prominent small bowel loops are no longer demonstrated. Nasogastric tube tip in the region of the gastric pylorus and side hole in the mid stomach. Cholecystectomy clips. Lower lumbar spine degenerative changes. IMPRESSION: Mildly improved colonic ileus. Electronically Signed   By: Claudie Revering M.D.   On: 07/04/2018 09:28    Anti-infectives: Anti-infectives (From admission, onward)   Start     Dose/Rate Route Frequency Ordered Stop   07/04/18 1000  rifaximin (XIFAXAN) tablet 550 mg     550 mg Per Tube 3 times daily 07/04/18 0925     06/30/18 2200  vancomycin (VANCOCIN) IVPB 1000 mg/200 mL premix  Status:  Discontinued     1,000 mg 200 mL/hr over 60 Minutes Intravenous Every 24 hours 06/30/18 2008 07/04/18 0925   06/30/18 2200  piperacillin-tazobactam (ZOSYN) IVPB 3.375 g     3.375 g 12.5 mL/hr over 240 Minutes Intravenous Every 8 hours 06/30/18 2011     06/30/18 0000  vancomycin variable dose per unstable renal function (pharmacist dosing)  Status:  Discontinued      Does not apply See admin instructions 06/29/18 1229 07/03/18 0747   06/29/18 1300  piperacillin-tazobactam (ZOSYN) IVPB 2.25 g  Status:  Discontinued     2.25 g 100 mL/hr over 30 Minutes Intravenous Every 8 hours 06/29/18 1229 06/30/18 2011   06/29/18 1300  vancomycin (VANCOCIN) 1,250 mg in sodium chloride 0.9 % 250 mL IVPB     1,250 mg 166.7 mL/hr over 90 Minutes Intravenous  Once 06/29/18 1229 06/29/18 2234       Assessment/Plan Nash cirrhosis with hepatic encephalopathy New onset atrial fibrillation with rapid ventricular rate New onset cardiomyopathy with EF 30 to 35% NewRight frontal operculum infarct History of seizure disorder Hypertension Type 2 diabetes well controlled History of schizophrenia  Abdominal distention with large and small bowel distention, ileus -CT shows ileus.  No findings in abdomen to explain WBC  -x-ray this morning shows mild  improvement in bowel/gas pattern.  Patient had large BM yesterday.  Agree with CCM to initiate TFs. -no surgical indications at this point -cont conservative management, we will sign off.     LOS: 11 days    Henreitta Cea , [facility] 07/04/2018, 10:12 AM Pager: [phone]

## 2018-07-05 ENCOUNTER — Inpatient Hospital Stay (HOSPITAL_COMMUNITY): Payer: Medicare Other

## 2018-07-05 LAB — CBC WITH DIFFERENTIAL/PLATELET
BASOS PCT: 0 %
Basophils Absolute: 0 10*3/uL (ref 0.0–0.1)
Eosinophils Absolute: 0.1 10*3/uL (ref 0.0–0.7)
Eosinophils Relative: 1 %
HCT: 26.8 % — ABNORMAL LOW (ref 36.0–46.0)
HEMOGLOBIN: 7.7 g/dL — AB (ref 12.0–15.0)
LYMPHS PCT: 7 %
Lymphs Abs: 1 10*3/uL (ref 0.7–4.0)
MCH: 18.6 pg — AB (ref 26.0–34.0)
MCHC: 28.7 g/dL — ABNORMAL LOW (ref 30.0–36.0)
MCV: 64.9 fL — ABNORMAL LOW (ref 78.0–100.0)
MONO ABS: 2.2 10*3/uL — AB (ref 0.1–1.0)
Monocytes Relative: 16 %
NEUTROS PCT: 76 %
Neutro Abs: 10.7 10*3/uL — ABNORMAL HIGH (ref 1.7–7.7)
PLATELETS: 105 10*3/uL — AB (ref 150–400)
RBC: 4.13 MIL/uL (ref 3.87–5.11)
RDW: 22.5 % — ABNORMAL HIGH (ref 11.5–15.5)
WBC: 14 10*3/uL — AB (ref 4.0–10.5)

## 2018-07-05 LAB — BASIC METABOLIC PANEL
Anion gap: 8 (ref 5–15)
BUN: 28 mg/dL — AB (ref 8–23)
CO2: 24 mmol/L (ref 22–32)
CREATININE: 0.96 mg/dL (ref 0.44–1.00)
Calcium: 8.4 mg/dL — ABNORMAL LOW (ref 8.9–10.3)
Chloride: 118 mmol/L — ABNORMAL HIGH (ref 98–111)
GFR calc Af Amer: >60 mL/min (ref >60)
GLUCOSE: 176 mg/dL — AB (ref 70–99)
Potassium: 4.2 mmol/L (ref 3.5–5.1)
SODIUM: 150 mmol/L — AB (ref 135–145)

## 2018-07-05 LAB — GLUCOSE, CAPILLARY
GLUCOSE-CAPILLARY: 120 mg/dL — AB (ref 70–99)
GLUCOSE-CAPILLARY: 144 mg/dL — AB (ref 70–99)
GLUCOSE-CAPILLARY: 168 mg/dL — AB (ref 70–99)
Glucose-Capillary: 117 mg/dL — ABNORMAL HIGH (ref 70–99)
Glucose-Capillary: 110 mg/dL — ABNORMAL HIGH (ref 70–99)
Glucose-Capillary: 178 mg/dL — ABNORMAL HIGH (ref 70–99)
Glucose-Capillary: 143 mg/dL — ABNORMAL HIGH (ref 70–99)

## 2018-07-05 LAB — COMPREHENSIVE METABOLIC PANEL
ALBUMIN: 2 g/dL — AB (ref 3.5–5.0)
ALT: 335 U/L — AB (ref 0–44)
AST: 83 U/L — ABNORMAL HIGH (ref 15–41)
Alkaline Phosphatase: 259 U/L — ABNORMAL HIGH (ref 38–126)
Anion gap: 7 (ref 5–15)
BUN: 31 mg/dL — ABNORMAL HIGH (ref 8–23)
CALCIUM: 8 mg/dL — AB (ref 8.9–10.3)
CHLORIDE: 116 mmol/L — AB (ref 98–111)
CO2: 24 mmol/L (ref 22–32)
CREATININE: 0.97 mg/dL (ref 0.44–1.00)
GFR calc non Af Amer: >60 mL/min (ref >60)
GLUCOSE: 121 mg/dL — AB (ref 70–99)
Potassium: 3.4 mmol/L — ABNORMAL LOW (ref 3.5–5.1)
SODIUM: 147 mmol/L — AB (ref 135–145)
Total Bilirubin: 3.9 mg/dL — ABNORMAL HIGH (ref 0.3–1.2)
Total Protein: 3.9 g/dL — ABNORMAL LOW (ref 6.5–8.1)

## 2018-07-05 LAB — C DIFFICILE QUICK SCREEN W PCR REFLEX
C DIFFICLE (CDIFF) ANTIGEN: NEGATIVE
C Diff interpretation: NOT DETECTED
C Diff toxin: NEGATIVE

## 2018-07-05 LAB — PROCALCITONIN: PROCALCITONIN: 2.03 ng/mL

## 2018-07-05 MED ORDER — FREE WATER
200.0000 mL | Status: DC
  Administered 2018-07-05 – 2018-07-06 (×8): 200 mL

## 2018-07-05 MED ORDER — POTASSIUM CHLORIDE 20 MEQ/15ML (10%) PO SOLN
40.0000 meq | ORAL | Status: AC
  Administered 2018-07-05 (×2): 40 meq
  Filled 2018-07-05 (×2): qty 30

## 2018-07-05 MED ORDER — VITAL HIGH PROTEIN PO LIQD
1000.0000 mL | ORAL | Status: DC
  Administered 2018-07-05: 1000 mL

## 2018-07-05 MED ORDER — ALBUMIN HUMAN 25 % IV SOLN
25.0000 g | Freq: Three times a day (TID) | INTRAVENOUS | Status: AC
  Administered 2018-07-05 (×3): 25 g via INTRAVENOUS
  Filled 2018-07-05: qty 50
  Filled 2018-07-05: qty 100
  Filled 2018-07-05: qty 50

## 2018-07-05 MED ORDER — FUROSEMIDE 10 MG/ML IJ SOLN
40.0000 mg | Freq: Three times a day (TID) | INTRAMUSCULAR | Status: AC
  Administered 2018-07-05 (×3): 40 mg via INTRAVENOUS
  Filled 2018-07-05 (×3): qty 4

## 2018-07-05 NOTE — Progress Notes (Signed)
PULMONARY / CRITICAL CARE MEDICINE   Name: Brandi [name] MRN: [id number] DOB: April 14, 1956    ADMISSION DATE:  06/23/2018 CONSULTATION DATE: 06/29/2018  REFERRING MD: Dr. Bonner Puna  CHIEF COMPLAINT: Altered mental status  HISTORY OF PRESENT ILLNESS:  62 y/o F who presented to [facility] on 7/9 with abdominal pain, nausea, vomiting, decreased oral intake and weakness.    She was reportedly evaluated by her PCP prior to admit with elevated LFT's (AST 302, ALT 611, AP 380).  Follow up CT of the abdomen demonstrated prominent caudate lobe and surface irregularity of the liver.   In the ER, US of the abdomen confirmed the CT findings of the liver.  She was noted to be lethargic and have asterixis on exam.  Ammonia 22 (7/10) > 38 (7/12).  The patient had AFwRVR and was treated with cardizem.  She was admitted for further evaluation.  The patient was seen by Cardiology and transitioned to metoprolol for rate control due to decreased LVEF (30-35%). Labs showed a microcytic anemia with ferritin of 9.  GI was consulted for evaluation with recommendations for lactulose and eventual endoscopic evaluation.  She had ongoing encephalopathy and a CT of the head was obtained which showed a right frontal operculum CVA.  Neurology evaluated the patient and felt the stroke was likely related to AF but not the cause of her mental status.  She was recommended anticoagulation and passed a swallow evaluation converting from heparin to eliquis.  On 7/15, her confusion, tardive dyskinesia symptoms and lethargy worsened as well as her LFT's (AST 3942, ALT 1976, ALK Phos 404), renal function and WBC.  PCCM consulted for evaluation.    SUBJECTIVE/INTERVAL  -7/19: Had been receiving fluid challenges for hypotension. 7/20: Early in the a.m. hours drop blood pressure.  Placed on pressors.  Non-anion gap acidosis noted.  Cortisol 29.2 CBC remains elevated blood cell count remains elevated.  7/21 no significant fever.  White cell count cut  in half.  Chest x-ray looks improved.  We resumed lactulose and rifaximin yesterday, also resumed spironolactone.  No significant events overnight. Still obtunded   VITAL SIGNS: Blood Pressure (Abnormal) 99/49   Pulse 82   Temperature 98.2 F (36.8 C) (Oral)   Respiration (Abnormal) 22   Height '5\' 2"'  (1.575 m)   Weight 187 lb 6.3 oz (85 kg)   Oxygen Saturation 100%   Body Mass Index 34.27 kg/m   HEMODYNAMICS:    VENTILATOR SETTINGS: Vent Mode: PSV;CPAP FiO2 (%):  [40 %] 40 % Set Rate:  [18 bmp] 18 bmp Vt Set:  [400 mL] 400 mL PEEP:  [5 cmH20] 5 cmH20 Pressure Support:  [5 cmH20] 5 cmH20 Plateau Pressure:  [11 cmH20-14 cmH20] 14 cmH20  INTAKE / OUTPUT:  Intake/Output Summary (Last 24 hours) at 07/05/2018 3762 Last data filed at 07/05/2018 8315 Gross per 24 hour  Intake 442.49 ml  Output 1210 ml  Net -767.51 ml    PHYSICAL EXAMINATION:  General: This is a 62 year old white female she is currently unresponsive on the ventilator HEENT normocephalic atraumatic orally intubated no jugular venous distention Pulmonary: Scattered rhonchi mechanically assisted ventilator breaths without accessory muscle use Cardiac: Controlled atrial fibrillation no murmur rub or gallop Abdomen: Soft, positive bowel sounds today, loose stools Extremities: Diffuse anasarca, weeping extremities, warm, strong pulses. Neuro: Responds only to noxious stimulus GU: Concentrated yellow urine   LABS:  BMET Recent Labs  Lab 07/03/18 0911 07/04/18 0234 07/05/18 0333  NA 138 142 147*  K 4.2 4.0  3.4*  CL 110 110 116*  CO2 17* 24 24  BUN 34* 40* 31*  CREATININE 1.45* 1.41* 0.97  GLUCOSE 123* 152* 121*    Electrolytes Recent Labs  Lab 06/29/18 1605  07/01/18 0341  07/03/18 0911 07/04/18 0234 07/05/18 0333  CALCIUM 9.3   < > 8.1*   < > 8.0* 7.9* 8.0*  MG 2.1  --  1.7  --  1.7  --   --   PHOS  --   --  1.3*  --  3.7  --   --    < > = values in this interval not displayed.     CBC Recent Labs  Lab 07/04/18 0234 07/04/18 0345 07/04/18 0352 07/05/18 0333  WBC 29.8* 31.6*  --  14.0*  HGB 7.8* 8.2*  --  7.7*  HCT 27.2* 27.6*  --  26.8*  PLT PLATELET CLUMPS NOTED ON SMEAR, COUNT APPEARS DECREASED 132* 136* 105*    Coag's Recent Labs  Lab 07/01/18 0341 07/04/18 0234 07/04/18 0352  APTT  --   --  22*  INR 2.87 1.82 1.86    Sepsis Markers Recent Labs  Lab 06/29/18 1605 06/29/18 2035 06/30/18 1959 07/04/18 0345  LATICACIDVEN 8.3* 4.6*  --  2.0*  PROCALCITON  --   --  0.31  --     ABG Recent Labs  Lab 06/29/18 1606 06/30/18 0510 07/04/18 0218  PHART 7.263* 7.471* 7.531*  PCO2ART 35.2 32.2 29.8*  PO2ART 333.0* 98.1 73.0*    Liver Enzymes Recent Labs  Lab 07/03/18 0911 07/04/18 0234 07/05/18 0333  AST 127* 73* 83*  ALT 745* 452* 335*  ALKPHOS 270* 203* 259*  BILITOT 4.3* 3.4* 3.9*  ALBUMIN 2.1* 2.1* 2.0*    Cardiac Enzymes Recent Labs  Lab 06/29/18 1605 06/29/18 2035 06/30/18 0211  TROPONINI 0.20* 0.28* 0.26*    Glucose Recent Labs  Lab 07/04/18 0801 07/04/18 1150 07/04/18 1540 07/04/18 2059 07/05/18 0038 07/05/18 0326  GLUCAP 147* 148* 134* 117* 144* 120*    Imaging  STUDIES:  CT Head 7/9 >> atrophy with small vessel chronic ischemic changes of deep cerebral white matter CT Renal Study 7/9 >> no acute findings, bilateral non-obstructing renal stones ECHO 7/9 >> LV mildly dilated, wall thickness was increased in a pattern of moderate LVH, LVEF 30-35%, severe hypokinesis of the anteroseptal, anterior, inferoseptal & apical myocardium, mild to moderate MV regurgitation, LA severely dilated, RV moderately dilated, RA mod-severely dilated, PA pressure 82mHg, trivial pericardial effusion. CTA Head, Neck 7/12 >> minimal calcified plaque at the carotid bifurcation regions but no stenosis or irregularity, no large vessel occlusions, unable to specifically identify the missing branch vessel responsible for the right  posterior frontal infarction but this is presumably an occluded M3 or distal branch CT Head w/o 7/12 >> the small right middle cerebral artery distribution, posterolateral frontal lobe infarct has evolved since the prior CT, it is now seen as a well defined area of hypoattenuation, no new areas of infarct, no ICH CT head 7/15 >> Redemonstration of right posterior frontal lobe infarct. No acute intracranial hemorrhage or new infarct identified.  Atrophy with chronic small vessel ischemia. CT abd/pelvis 7/18 > Diffuse dilation of the colon with large amount of ascending colonic stool.   CULTURES: BCx2 7/10 >> negative  cdiff 7/20>>> Sputum 7/20>>>  ANTIBIOTICS: Vanco 7/15 >> 7/21 Zoysn 7/15 >>   SIGNIFICANT EVENTS: 7/09  Admit  7/15  PCCM consulted with AMS, acute liver failure  LINES/TUBES: ETT 7/15 >>  DISCUSSION: 62 y/o F admitted with weakness, decreased intake and lethargy.  Work up concerning for NASH, [place].  She developed AMS > small infarct thought not related to AMS.  Recommended for anticoagulation by Cardiology/Neurology, was transitioned from heparin to eliquis.  The patient deteriorated on 7/15 with a rise in LFT's, AKI, confusion/AMS, worsening of tardive dyskinesia type symptoms.  Transferred to ICU.   -Mental status remains major barrier.   -Continue medical supportive care with lactulose and rifaximin mean -Trying to mobilize fluid with albumin and Lasix, added back spironolactone on 7/20 -DC A-line -DC digoxin -Start trickle feeds  ASSESSMENT / PLAN:   Acute Respiratory Insufficiency - in setting of liver failure  Bilateral Pleural Effusions - suspect in setting of liver disease, HF, AKI -Portable chest x-ray personally reviewed: Her endotracheal tube is in satisfactory position.  Could retract approximately 1 cm.  She has elevated right hemidiaphragm with a somewhat rotated chest x-ray this morning.  The right sided infiltrate appears a little improved -Sputum  culture sent on 7/20, no organisms identified thus far -Her mental status is still quite obtunded.  She is not ready for extubation Plan Continue mechanically assisted ventilation with daily spontaneous breathing trials PRN chest x-ray Resting ventilator rate changed to 12 given iatrogenic hyperventilation VAP interventions RASS goal 0  Acute metabolic & hepatic  encephalopathy/ Acute delirium CVA: Right Frontal Operculum infarct - felt related to AF/embolic but not all cause of mental status Right-sided weakness Seizure disorder Schizophrenia Plan RASS goal 0 Holding BuSpar and Effexor for now, may need to consider resuming this if concern for withdrawal arises Continue Keppra Continue lactulose and rifaximin Close observation of chemistry, need to avoid hypernatremia    Ileus seems to have improved as of 7/20 although Now moving bowels as of 7/19 still has significant gas pattern on one view Plan Resume trickle feeds Avoid narcotics Avoid hypokalemia  Follow-up C. difficile    HFrEF A. fib with RVR HTN -Had transient hypotension the a.m. of 7/20.  Etiology not entirely clear if this required pressors transiently now hypertensive without pressors - mildly elevated troponin's; EKG unchanged  -cardiology following AC held d/t liver dysfxn Plan Diuresis resumed Except mean arterial pressure greater than 60 Discontinue digoxin with concern for risk of toxicity Lopressor via tube Holding anticoagulation due to bleeding risk with hepatic dysfunction   AKI - in setting of liver injury, NASH Renal function is improving, continues to have non-anion gap metabolic acidosis, she is hemodynamically stable -Fluid balance a little better, but remains over 16 L positive Plan Continuing spironolactone Strict intake output Renal dose medications Follow-up chemistry  Fluid and Electrolyte imbalance: hypernatremia, Hypokalemia & volume overload, and hypernatremia with  hyperchloremia Plan free water replacement Lasix and albumin today to try to mobilize third spacing Continue spironolactone Replace potassium Daily chemistries for now  Karlene Lineman Cirrhosis with Hepatic Encephalopathy  - unclear in what precipitated change in events LFTs/ decompensation 7/15 - 7/15 US liver w/doppler with no evidence of portal venous thrombosis  -LFTs without significant change Plan Trend LFTs every other day Will need GI follow-up  Possible GIB Hemoglobin stable, down to 7.7 but no significant drop Plan To new twice daily PPI   Leukocytosis, questionable sepsis however etiology unclear.  Question of spontaneous bacterial peritonitis -Her white blood cell count has cut in half.  We have not change antibiotics.  I am not sure I have a good explanation about this.  I was concerned initially about C. difficile, although I  am less likely to favor this now Plan Day #7 vancomycin and Zosyn, will await respiratory culture if this is negative we can at least discontinue the vancomycin, we can also consider Stopping Zosyn in the next 24 hours.  Procalcitonin may be helpful here, will send Follow-up pending culture data   Iron Deficiency Anemia  Plan Follow-up serial CBCs  Coagulopathy, and thrombocytopenia - s/p 3 units FFP 7/16 am Plan Every other day INR Holding heparin  Hx Breast Cancer Plan Follow-up as an outpatient    DM 2 Glycemic control is acceptable Plan Continue sliding-scale insulin   DVT prophylaxis: scd SUP: ppi Diet: NPO Activity: BR Disposition : ICU  FAMILY  - Updates: Pending  Erick Colace ACNP-BC [place] Pager # [phone] OR # [phone] if no answer

## 2018-07-05 NOTE — Progress Notes (Signed)
IJ does not draw back. Unable to draw labs, have contacted phlebotomy to obtain ordered labs.

## 2018-07-05 NOTE — Progress Notes (Signed)
PT weaned for 7.5 hours on PS 5/ PEEP +5. Placed pt back on PRVC d/t increased RR and agitation. VS stable and RN aware.

## 2018-07-05 NOTE — Progress Notes (Signed)
Progress Note  Patient Name: Brandi [name] Date of Encounter: 07/05/2018  Primary Cardiologist: Mertie Moores, MD   Subjective   Intubated and sedated. Discussed w/team at bedside.  Inpatient Medications    Scheduled Meds: . chlorhexidine gluconate (MEDLINE KIT)  15 mL Mouth Rinse BID  . Chlorhexidine Gluconate Cloth  6 each Topical Daily  . digoxin  0.25 mg Intravenous Daily  . insulin aspart  0-9 Units Subcutaneous Q4H  . lactulose  30 g Per Tube TID  . mouth rinse  15 mL Mouth Rinse 10 times per day  . metoprolol tartrate  50 mg Per Tube BID  . pantoprazole sodium  40 mg Per Tube Q1200  . rifaximin  550 mg Per Tube TID  . spironolactone  50 mg Per Tube Daily   Continuous Infusions: . lacosamide (VIMPAT) IV 100 mg (07/04/18 2104)  . levETIRAcetam 1,000 mg (07/05/18 0145)  . piperacillin-tazobactam (ZOSYN)  IV 12.5 mL/hr at 07/05/18 0700   PRN Meds: Vital Signs    Vitals:   07/05/18 0700 07/05/18 0741 07/05/18 0743 07/05/18 0826  BP: (!) 87/55 (!) 108/54 (!) 99/49   Pulse: 87 76 82   Resp: 18 18 (!) 22   Temp:    (!) 97.4 F (36.3 C)  TempSrc:    Oral  SpO2: 99% 100% 100%   Weight:      Height:        Intake/Output Summary (Last 24 hours) at 07/05/2018 0857 Last data filed at 07/05/2018 0726 Gross per 24 hour  Intake 442.49 ml  Output 1210 ml  Net -767.51 ml   Filed Weights   07/03/18 0500 07/04/18 0426 07/05/18 0318  Weight: 185 lb 3 oz (84 kg) 188 lb 15 oz (85.7 kg) 187 lb 6.3 oz (85 kg)    Telemetry    Atrial fib in the low 80s today. Regularized.  ECG   no new ECGs  Physical Exam   GEN: Patient intubated  Neck: Neck with central line in L IJ. Difficult to assess JVD 2/2 boday habitus Cardiac: regular, normal S1 and S2, no appreciable murmurs. Respiratory: Coarse BS    GI: Distended  Decreased BS MS: 2+ edema;  Feet warm, hands warm though slightly cooler than feet. No mottling. Neuro:  Intubated   Sedated. Moving all limbs  independently in bed.  Labs    Chemistry Recent Labs  Lab 07/03/18 0911 07/04/18 0234 07/05/18 0333  NA 138 142 147*  K 4.2 4.0 3.4*  CL 110 110 116*  CO2 17* 24 24  GLUCOSE 123* 152* 121*  BUN 34* 40* 31*  CREATININE 1.45* 1.41* 0.97  CALCIUM 8.0* 7.9* 8.0*  PROT 4.3* 3.8* 3.9*  ALBUMIN 2.1* 2.1* 2.0*  AST 127* 73* 83*  ALT 745* 452* 335*  ALKPHOS 270* 203* 259*  BILITOT 4.3* 3.4* 3.9*  GFRNONAA 38* 39* >60  GFRAA 44* 45* >60  ANIONGAP _0 Hematology Recent Labs  Lab 07/04/18 0234 07/04/18 0345 07/04/18 0352 07/05/18 0333  WBC 29.8* 31.6*  --  14.0*  RBC 4.22 4.34  --  4.13  HGB 7.8* 8.2*  --  7.7*  HCT 27.2* 27.6*  --  26.8*  MCV 64.5* 63.6*  --  64.9*  MCH 18.5* 18.9*  --  18.6*  MCHC 28.7* 29.7*  --  28.7*  RDW 21.5* 22.0*  --  22.5*  PLT PLATELET CLUMPS NOTED ON SMEAR, COUNT APPEARS DECREASED 132* 136* 105*  Cardiac Enzymes Recent Labs  Lab 06/29/18 1605 06/29/18 2035 06/30/18 0211  TROPONINI 0.20* 0.28* 0.26*   No results for input(s): TROPIPOC in the last 168 hours.   BNP Recent Labs  Lab 06/29/18 1605  BNP 2,383.4*     DDimer  Recent Labs  Lab 07/04/18 0352  DDIMER 7.39*     Radiology    Dg Chest Port 1 View  Result Date: 07/04/2018 CLINICAL DATA:  Intubated. EXAM: PORTABLE CHEST 1 VIEW COMPARISON:  07/01/2018. FINDINGS: The endotracheal tube tip is in the right mainstem bronchus. Nasogastric tube extending into the stomach. Left jugular catheter tip at the superior cavoatrial junction. Enlarged cardiac silhouette with improvement. Stable prominent interstitial markings without pleural fluid. Cholecystectomy clips. Unremarkable bones. IMPRESSION: 1. The endotracheal tube tip is in the right mainstem bronchus. It is recommended that this be retracted 6 cm. 2. Mildly improved cardiomegaly. 3. Stable mild chronic interstitial lung disease. These results will be called to the ordering clinician or representative by the Radiologist  Assistant, and communication documented in the PACS or zVision Dashboard. Electronically Signed   By: Claudie Revering M.D.   On: 07/04/2018 09:24   Dg Abd Portable 1v  Result Date: 07/04/2018 CLINICAL DATA:  Followup colonic ileus. EXAM: PORTABLE ABDOMEN - 1 VIEW COMPARISON:  Abdomen and pelvis CT dated 07/02/2018. FINDINGS: There has been mild improvement in previously demonstrated dilated colon. Previously seen prominent small bowel loops are no longer demonstrated. Nasogastric tube tip in the region of the gastric pylorus and side hole in the mid stomach. Cholecystectomy clips. Lower lumbar spine degenerative changes. IMPRESSION: Mildly improved colonic ileus. Electronically Signed   By: Claudie Revering M.D.   On: 07/04/2018 09:28   Assessment & Plan    1  Acute systolic and diastolic heart failure -CVP not measuring anymore. Tolerated being net negative yesterday, but did have some hypotension this AM.  -Echo from 06/23/18 with EF of 30-35%. On metoprolol, lower heart rate today. BP too variable for additional meds at this time.  -Discussed at length with primary team. Given that CVPs appear inaccurate and now not reliable at all, then may try gentle diuresis and monitor for response. If questions about her cardiac status remain, recommend Swan placement or RHC for further evaluation of her cardiac index and volume status.  2    Rhythm: Afib / flutter, regularized but no clear P waves. HR 80s now. -improved slightly today, continue on beta blocker -afib looks regularized. Would stop digoxin and continue rate control with beta blocker.  -hold anticoagulation given liver dysfunction and bleeding risk  Pt remains critically ill. Unclear how much of her current status is due to her heart. Primary team now concerned for infection/c diff.  We will sign off for now, given that the majority of her issues are non-cardiac. If plans to pursue invasive hemodynamics, or if she stabilizes enough to increase heart  failure medications, please call us and we would be happy to be of additional assistance. Thank you for allowing [place] to participate in her care.  For questions or updates, please contact [facility] Please consult [email or web address] for contact info under Cardiology/STEMI.   Signed, Buford Dresser, MD  07/05/2018, 8:57 AM    [facility] will sign off.   Medication Recommendations:  As above Other recommendations (labs, testing, etc):  Would consider invasive hemodynamics if questions remain Follow up as an outpatient:  Dr. Acie Fredrickson

## 2018-07-06 ENCOUNTER — Inpatient Hospital Stay (HOSPITAL_COMMUNITY): Payer: Medicare Other

## 2018-07-06 LAB — COMPREHENSIVE METABOLIC PANEL
ALK PHOS: 220 U/L — AB (ref 38–126)
ALT: 216 U/L — ABNORMAL HIGH (ref 0–44)
ANION GAP: 12 (ref 5–15)
AST: 48 U/L — ABNORMAL HIGH (ref 15–41)
Albumin: 3.1 g/dL — ABNORMAL LOW (ref 3.5–5.0)
BILIRUBIN TOTAL: 4.2 mg/dL — AB (ref 0.3–1.2)
BUN: 24 mg/dL — AB (ref 8–23)
CO2: 29 mmol/L (ref 22–32)
Calcium: 8.6 mg/dL — ABNORMAL LOW (ref 8.9–10.3)
Chloride: 114 mmol/L — ABNORMAL HIGH (ref 98–111)
Creatinine, Ser: 1.06 mg/dL — ABNORMAL HIGH (ref 0.44–1.00)
GFR calc Af Amer: >60 mL/min (ref >60)
GFR calc non Af Amer: 55 mL/min — ABNORMAL LOW (ref >60)
Glucose, Bld: 150 mg/dL — ABNORMAL HIGH (ref 70–99)
POTASSIUM: 2.4 mmol/L — AB (ref 3.5–5.1)
Sodium: 155 mmol/L — ABNORMAL HIGH (ref 135–145)
TOTAL PROTEIN: 4.8 g/dL — AB (ref 6.5–8.1)

## 2018-07-06 LAB — CBC WITH DIFFERENTIAL/PLATELET
Basophils Absolute: 0 10*3/uL (ref 0.0–0.1)
Basophils Relative: 0 %
EOS PCT: 1 %
Eosinophils Absolute: 0.1 10*3/uL (ref 0.0–0.7)
HEMATOCRIT: 24 % — AB (ref 36.0–46.0)
Hemoglobin: 6.9 g/dL — CL (ref 12.0–15.0)
LYMPHS ABS: 0.9 10*3/uL (ref 0.7–4.0)
Lymphocytes Relative: 11 %
MCH: 19.1 pg — AB (ref 26.0–34.0)
MCHC: 28.8 g/dL — AB (ref 30.0–36.0)
MCV: 66.3 fL — AB (ref 78.0–100.0)
MONO ABS: 1 10*3/uL (ref 0.1–1.0)
Monocytes Relative: 13 %
NEUTROS ABS: 6 10*3/uL (ref 1.7–7.7)
Neutrophils Relative %: 75 %
PLATELETS: 100 10*3/uL — AB (ref 150–400)
RBC: 3.62 MIL/uL — AB (ref 3.87–5.11)
RDW: 23.4 % — AB (ref 11.5–15.5)
WBC: 8 10*3/uL (ref 4.0–10.5)

## 2018-07-06 LAB — PROTIME-INR
INR: 1.65
Prothrombin Time: 19.3 seconds — ABNORMAL HIGH (ref 11.4–15.2)

## 2018-07-06 LAB — BASIC METABOLIC PANEL
ANION GAP: 13 (ref 5–15)
Anion gap: 10 (ref 5–15)
BUN: 25 mg/dL — ABNORMAL HIGH (ref 8–23)
BUN: 27 mg/dL — ABNORMAL HIGH (ref 8–23)
CALCIUM: 8.6 mg/dL — AB (ref 8.9–10.3)
CO2: 27 mmol/L (ref 22–32)
CO2: 25 mmol/L (ref 22–32)
Calcium: 8.5 mg/dL — ABNORMAL LOW (ref 8.9–10.3)
Chloride: 118 mmol/L — ABNORMAL HIGH (ref 98–111)
Chloride: 115 mmol/L — ABNORMAL HIGH (ref 98–111)
Creatinine, Ser: 1.06 mg/dL — ABNORMAL HIGH (ref 0.44–1.00)
Creatinine, Ser: 1.09 mg/dL — ABNORMAL HIGH (ref 0.44–1.00)
GFR calc Af Amer: >60 mL/min (ref >60)
GFR calc non Af Amer: 55 mL/min — ABNORMAL LOW (ref >60)
GFR calc non Af Amer: 53 mL/min — ABNORMAL LOW (ref >60)
Glucose, Bld: 155 mg/dL — ABNORMAL HIGH (ref 70–99)
Glucose, Bld: 182 mg/dL — ABNORMAL HIGH (ref 70–99)
Potassium: 4.3 mmol/L (ref 3.5–5.1)
Potassium: 3.7 mmol/L (ref 3.5–5.1)
SODIUM: 153 mmol/L — AB (ref 135–145)
Sodium: 155 mmol/L — ABNORMAL HIGH (ref 135–145)

## 2018-07-06 LAB — CULTURE, RESPIRATORY: CULTURE: NORMAL

## 2018-07-06 LAB — PROCALCITONIN: Procalcitonin: 1.85 ng/mL

## 2018-07-06 LAB — GLUCOSE, CAPILLARY
GLUCOSE-CAPILLARY: 140 mg/dL — AB (ref 70–99)
GLUCOSE-CAPILLARY: 171 mg/dL — AB (ref 70–99)
Glucose-Capillary: 132 mg/dL — ABNORMAL HIGH (ref 70–99)
Glucose-Capillary: 152 mg/dL — ABNORMAL HIGH (ref 70–99)
Glucose-Capillary: 152 mg/dL — ABNORMAL HIGH (ref 70–99)
Glucose-Capillary: 172 mg/dL — ABNORMAL HIGH (ref 70–99)
Glucose-Capillary: 158 mg/dL — ABNORMAL HIGH (ref 70–99)

## 2018-07-06 LAB — CULTURE, RESPIRATORY W GRAM STAIN: Special Requests: NORMAL

## 2018-07-06 LAB — AMMONIA: Ammonia: 36 umol/L — ABNORMAL HIGH (ref 9–35)

## 2018-07-06 LAB — PREPARE RBC (CROSSMATCH)

## 2018-07-06 MED ORDER — FREE WATER
350.0000 mL | Status: DC
  Administered 2018-07-06 – 2018-07-07 (×5): 350 mL

## 2018-07-06 MED ORDER — POTASSIUM CHLORIDE 10 MEQ/100ML IV SOLN
10.0000 meq | INTRAVENOUS | Status: AC
Start: 2018-07-06 — End: 2018-07-06
  Administered 2018-07-06 (×4): 10 meq via INTRAVENOUS
  Filled 2018-07-06 (×4): qty 100

## 2018-07-06 MED ORDER — IBUPROFEN 100 MG/5ML PO SUSP
400.0000 mg | Freq: Four times a day (QID) | ORAL | Status: DC | PRN
  Administered 2018-07-06 – 2018-07-11 (×4): 400 mg via ORAL
  Filled 2018-07-06 (×4): qty 20

## 2018-07-06 MED ORDER — ADULT MULTIVITAMIN LIQUID CH
15.0000 mL | Freq: Every day | ORAL | Status: DC
  Administered 2018-07-06 – 2018-07-12 (×7): 15 mL via ORAL
  Filled 2018-07-06 (×6): qty 15

## 2018-07-06 MED ORDER — ATROPINE SULFATE 1 MG/10ML IJ SOSY
PREFILLED_SYRINGE | INTRAMUSCULAR | Status: AC
  Filled 2018-07-06: qty 10

## 2018-07-06 MED ORDER — PRO-STAT SUGAR FREE PO LIQD
30.0000 mL | Freq: Two times a day (BID) | ORAL | Status: DC
  Administered 2018-07-06 – 2018-07-09 (×6): 30 mL via ORAL
  Filled 2018-07-06 (×6): qty 30

## 2018-07-06 MED ORDER — SODIUM CHLORIDE 0.9% IV SOLUTION
Freq: Once | INTRAVENOUS | Status: AC
  Administered 2018-07-06: 06:00:00 via INTRAVENOUS

## 2018-07-06 MED ORDER — VITAL HIGH PROTEIN PO LIQD
1000.0000 mL | ORAL | Status: DC
  Administered 2018-07-06 – 2018-07-09 (×3): 1000 mL

## 2018-07-06 MED ORDER — POTASSIUM CHLORIDE 20 MEQ/15ML (10%) PO SOLN
40.0000 meq | ORAL | Status: AC
  Administered 2018-07-06 (×3): 40 meq
  Filled 2018-07-06 (×3): qty 30

## 2018-07-06 NOTE — Progress Notes (Signed)
CRITICAL VALUE ALERT  Critical Value:  Hemoglobin 6.9   Potassium 2.4  Date & Time Notied:  07/06/18  0530  Provider Notified: Warren Lacy  Orders Received/Actions taken:

## 2018-07-06 NOTE — Progress Notes (Signed)
Placed pt back on PRVC at charted setting due to increased RR and agitation. No current respiratory issues at this time.

## 2018-07-06 NOTE — Progress Notes (Signed)
[facility] ADULT ICU REPLACEMENT PROTOCOL FOR AM LAB REPLACEMENT ONLY  The patient does apply for the [facility] Adult ICU Electrolyte Replacment Protocol based on the criteria listed below:   1. Is GFR >/= 40 ml/min? Yes.    Patient's GFR today is >60 2. Is urine output >/= 0.5 ml/kg/hr for the last 6 hours? Yes.   Patient's UOP is 2.54 ml/kg/hr 3. Is BUN < 60 mg/dL? Yes.    Patient's BUN today is 24 4. Abnormal electrolyte  K 2.4 5. Ordered repletion with: per protocol 6. If a panic level lab has been reported, has the CCM MD in charge been notified? Yes.  .   Physician:  Bethann Humble  Christeen Douglas 07/06/2018 5:39 AM

## 2018-07-06 NOTE — Progress Notes (Signed)
Pharmacy Antibiotic Note  Brandi [name] is a 62 y.o. female admitted on 06/23/2018 with sepsis.  Pharmacy has been consulted for Zosyn dosing and today is day #8 of therapy.  Renal function relatively stable.  Afebrile, WBC normalized, PCT improving.   Plan: Continue Zosyn EID 3.375gm IV Q8H Monitor renal fxn, clinical progress, abx LOT   Height: 5\' 2"  (157.5 cm) Weight: 187 lb 6.3 oz (85 kg) IBW/kg (Calculated) : 50.1  Temp (24hrs), Avg:99.5 F (37.5 C), Min:97.6 F (36.4 C), Max:101 F (38.3 C)  Recent Labs  Lab 06/29/18 1244 06/29/18 1605 06/29/18 2035  06/30/18 1703  07/03/18 0424 07/03/18 0911 07/03/18 2130 07/04/18 0234 07/04/18 0345 07/05/18 0333 07/05/18 1151 07/06/18 0424  WBC  --   --   --    < >  --    < > 46.7*  --   --  29.8* 31.6* 14.0*  --  8.0  CREATININE  --  2.31*  --    < >  --    < >  --  1.45*  --  1.41*  --  0.97 0.96 1.06*  LATICACIDVEN 10.8* 8.3* 4.6*  --   --   --   --   --   --   --  2.0*  --   --   --   VANCOTROUGH  --   --   --   --   --   --   --   --  15  --   --   --   --   --   VANCORANDOM  --   --   --   --  6  --   --   --   --   --   --   --   --   --    < > = values in this interval not displayed.    Estimated Creatinine Clearance: 55.7 mL/min (A) (by C-G formula based on SCr of 1.06 mg/dL (H)).    No Known Allergies   Zosyn 7/15>> Vanc 7/15>>7/21  7/16 VR = 10 (~ 24 hrs post 1 g) 7/19 VT = 15 on 1g q24  7/9 MRSA - negative 7/10 BCx - negative 7/15 BCx - negative 7/20 C.diff - negative 7/20 TA - negative   [name] D. Mina Marble, PharmD, BCPS, [place] 07/06/2018, 11:06 AM

## 2018-07-06 NOTE — Progress Notes (Signed)
PULMONARY / CRITICAL CARE MEDICINE   Name: BRELYNN WHELLER MRN: [id number] DOB: 1956-03-14    ADMISSION DATE:  06/23/2018 CONSULTATION DATE: 06/29/2018  REFERRING MD: Dr. Bonner Puna  CHIEF COMPLAINT: Altered mental status  HISTORY OF PRESENT ILLNESS:  62 y/o F who presented to [facility] on 7/9 with abdominal pain, nausea, vomiting, decreased oral intake and weakness.    She was reportedly evaluated by her PCP prior to admit with elevated LFT's (AST 302, ALT 611, AP 380).  Follow up CT of the abdomen demonstrated prominent caudate lobe and surface irregularity of the liver.   In the ER, US of the abdomen confirmed the CT findings of the liver.  She was noted to be lethargic and have asterixis on exam.  Ammonia 22 (7/10) > 38 (7/12).  The patient had AFwRVR and was treated with cardizem.  She was admitted for further evaluation.  The patient was seen by Cardiology and transitioned to metoprolol for rate control due to decreased LVEF (30-35%). Labs showed a microcytic anemia with ferritin of 9.  GI was consulted for evaluation with recommendations for lactulose and eventual endoscopic evaluation.  She had ongoing encephalopathy and a CT of the head was obtained which showed a right frontal operculum CVA.  Neurology evaluated the patient and felt the stroke was likely related to AF but not the cause of her mental status.  She was recommended anticoagulation and passed a swallow evaluation converting from heparin to eliquis.  On 7/15, her confusion, tardive dyskinesia symptoms and lethargy worsened as well as her LFT's (AST 3942, ALT 1976, ALK Phos 404), renal function and WBC.  PCCM consulted for evaluation.    -7/19: Had been receiving fluid challenges for hypotension. 7/20: Early in the a.m. hours drop blood pressure.  Placed on pressors.  Non-anion gap acidosis noted.  Cortisol 29.2 CBC remains elevated blood cell count remains elevated.  7/21 no significant fever.  White cell count cut in half.  Chest x-ray  looks improved.  We resumed lactulose and rifaximin yesterday, also resumed spironolactone.  No significant events overnight. Still obtunded   SUBJECTIVE/INTERVAL  No acute events.  Continues on ventilator Did not tolerate weans.  Mental status continues to be poor.  VITAL SIGNS: BP 125/71   Pulse 89   Temp (!) 100.9 F (38.3 C) (Oral)   Resp (!) 23   Ht 5' 2" (1.575 m)   Wt 187 lb 6.3 oz (85 kg)   SpO2 99%   BMI 34.27 kg/m   HEMODYNAMICS:    VENTILATOR SETTINGS: Vent Mode: PRVC FiO2 (%):  [40 %] 40 % Set Rate:  [12 bmp] 12 bmp Vt Set:  [400 mL] 400 mL PEEP:  [5 cmH20] 5 cmH20 Pressure Support:  [5 cmH20] 5 cmH20 Plateau Pressure:  [11 cmH20-15 cmH20] 14 cmH20  INTAKE / OUTPUT:  Intake/Output Summary (Last 24 hours) at 07/06/2018 1300 Last data filed at 07/06/2018 1219 Gross per 24 hour  Intake 1942.76 ml  Output 4356 ml  Net -2413.24 ml    PHYSICAL EXAMINATION: Gen:      No acute distress HEENT:  EOMI, sclera anicteric Neck:     No masses; no thyromegaly, ETT tube Lungs:    Clear to auscultation bilaterally; normal respiratory effort CV:         Irregular; no murmurs Abd:      + bowel sounds; soft, non-tender; no palpable masses, no distension Ext:    Diffuse anasarca; adequate peripheral perfusion Skin:  Warm and dry; no rash Neuro: Sedated, unresponsive.  LABS:  BMET Recent Labs  Lab 07/05/18 0333 07/05/18 1151 07/06/18 0424  NA 147* 150* 155*  K 3.4* 4.2 2.4*  CL 116* 118* 114*  CO2 _0 BUN 31* 28* 24*  CREATININE 0.97 0.96 1.06*  GLUCOSE 121* 176* 150*    Electrolytes Recent Labs  Lab 06/29/18 1605  07/01/18 0341  07/03/18 0911  07/05/18 0333 07/05/18 1151 07/06/18 0424  CALCIUM 9.3   < > 8.1*   < > 8.0*   < > 8.0* 8.4* 8.6*  MG 2.1  --  1.7  --  1.7  --   --   --   --   PHOS  --   --  1.3*  --  3.7  --   --   --   --    < > = values in this interval not displayed.    CBC Recent Labs  Lab 07/04/18 0345 07/04/18 0352  07/05/18 0333 07/06/18 0424  WBC 31.6*  --  14.0* 8.0  HGB 8.2*  --  7.7* 6.9*  HCT 27.6*  --  26.8* 24.0*  PLT 132* 136* 105* 100*    Coag's Recent Labs  Lab 07/04/18 0234 07/04/18 0352 07/06/18 0424  APTT  --  22*  --   INR 1.82 1.86 1.65    Sepsis Markers Recent Labs  Lab 06/29/18 1605 06/29/18 2035 06/30/18 1959 07/04/18 0345 07/05/18 1151 07/06/18 0424  LATICACIDVEN 8.3* 4.6*  --  2.0*  --   --   PROCALCITON  --   --  0.31  --  2.03 1.85    ABG Recent Labs  Lab 06/29/18 1606 06/30/18 0510 07/04/18 0218  PHART 7.263* 7.471* 7.531*  PCO2ART 35.2 32.2 29.8*  PO2ART 333.0* 98.1 73.0*    Liver Enzymes Recent Labs  Lab 07/04/18 0234 07/05/18 0333 07/06/18 0424  AST 73* 83* 48*  ALT 452* 335* 216*  ALKPHOS 203* 259* 220*  BILITOT 3.4* 3.9* 4.2*  ALBUMIN 2.1* 2.0* 3.1*    Cardiac Enzymes Recent Labs  Lab 06/29/18 1605 06/29/18 2035 06/30/18 0211  TROPONINI 0.20* 0.28* 0.26*    Glucose Recent Labs  Lab 07/05/18 1556 07/05/18 2006 07/06/18 0055 07/06/18 0410 07/06/18 0736 07/06/18 1128  GLUCAP 178* 143* 171* 140* 132* 152*    Imaging  STUDIES:  CT Head 7/9 >> atrophy with small vessel chronic ischemic changes of deep cerebral white matter CT Renal Study 7/9 >> no acute findings, bilateral non-obstructing renal stones ECHO 7/9 >> LV mildly dilated, wall thickness was increased in a pattern of moderate LVH, LVEF 30-35%, severe hypokinesis of the anteroseptal, anterior, inferoseptal & apical myocardium, mild to moderate MV regurgitation, LA severely dilated, RV moderately dilated, RA mod-severely dilated, PA pressure 99mHg, trivial pericardial effusion. CTA Head, Neck 7/12 >> minimal calcified plaque at the carotid bifurcation regions but no stenosis or irregularity, no large vessel occlusions, unable to specifically identify the missing branch vessel responsible for the right posterior frontal infarction but this is presumably an occluded  M3 or distal branch CT Head w/o 7/12 >> the small right middle cerebral artery distribution, posterolateral frontal lobe infarct has evolved since the prior CT, it is now seen as a well defined area of hypoattenuation, no new areas of infarct, no ICH CT head 7/15 >> Redemonstration of right posterior frontal lobe infarct. No acute intracranial hemorrhage or new infarct identified.  Atrophy with chronic small vessel ischemia. CT abd/pelvis 7/18 >  Diffuse dilation of the colon with large amount of ascending colonic stool.   CULTURES: BCx2 7/10 >> negative  cdiff 7/20>>> Sputum 7/20>>>  ANTIBIOTICS: Vanco 7/15 >> 7/21 Zoysn 7/15 >>   SIGNIFICANT EVENTS: 7/09  Admit  7/15  PCCM consulted with AMS, acute liver failure  LINES/TUBES: ETT 7/15 >>  DISCUSSION: 62 y/o F admitted with weakness, decreased intake and lethargy.  Work up concerning for NASH, [place].  She developed AMS > small infarct thought not related to AMS.  Recommended for anticoagulation by Cardiology/Neurology, was transitioned from heparin to eliquis.  The patient deteriorated on 7/15 with a rise in LFT's, AKI, confusion/AMS, worsening of tardive dyskinesia type symptoms.  Transferred to ICU.    ICU course significant for ongoing issues with mental status which is a major barrier to extubation Continues on lactulose, rifaximin.  ASSESSMENT / PLAN: Acute Respiratory Insufficiency - in setting of liver failure  Bilateral Pleural Effusions - suspect in setting of liver disease, HF, AKI  Plan Continue ventilator, daily spontaneous breathing trials as tolerated Follow chest x-ray  Acute metabolic & hepatic  encephalopathy/ Acute delirium CVA: Right Frontal Operculum infarct - felt related to AF/embolic but not all cause of mental status Right-sided weakness Seizure disorder Schizophrenia  Plan Holding BuSpar, Effexor. Continue Keppra, vimpat, lactulose, rifaximin. Follow labs.  Ileus > improving Plan Continue  trickle feeds Cortrak placement  HFrEF A. fib with RVR HTN -Had transient hypotension the a.m. of 7/20.  Etiology not entirely clear if this required pressors transiently now hypertensive without pressors - mildly elevated troponin's; EKG unchanged  -cardiology following AC held d/t liver dysfxn  Plan Back on diuretics. Continue spirinolactone Continue lopressor Holding anticoagulation due to bleeding risk with hepatic dysfunction  AKI - in setting of liver injury, NASH Plan Continuing spironolactone Strict intake output Renal dose medications Follow-up chemistry   Fluid and Electrolyte imbalance: hypernatremia, Hypokalemia & volume overload, and hypernatremia with hyperchloremia Plan Increase free water Replete potassium  [place] Cirrhosis with Hepatic Encephalopathy  - unclear in what precipitated change in events LFTs/ decompensation 7/15 - 7/15 US liver w/doppler with no evidence of portal venous thrombosis  -LFTs without significant change Plan Follow LFTs  Low Hb Plan PPI twice daily Transfuse 1 unit for hemoglobin of 6.9.  No evidence of active bleed.   Leukocytosis, questionable sepsis however etiology unclear.  Question of spontaneous bacterial peritonitis Plan Off vancomycin since yesterday Stop Zosyn after 7 days of therapy.  Coagulopathy, and thrombocytopenia - s/p 3 units FFP 7/16 am Plan Follow INR, holding heparin.  Hx Breast Cancer Plan Follow up as outpatient    DM 2 Plan SSI coverage.  DVT prophylaxis: scd SUP: ppi Diet: NPO Activity: BR Disposition : ICU  FAMILY  - Updates: Pending  The patient is critically ill with multiple organ system failure and requires high complexity decision making for assessment and support, frequent evaluation and titration of therapies, advanced monitoring, review of radiographic studies and interpretation of complex data.   Critical Care Time devoted to patient care services, exclusive of separately  billable procedures, described in this note is 35 minutes.   Marshell Garfinkel MD [place] [facility] Pager [phone] If no answer or after 3pm call: [phone] 07/06/2018, 1:20 PM

## 2018-07-06 NOTE — Progress Notes (Signed)
CSW continuing to follow and assist with disposition when pt is medically stable  Jorge Ny, [place] Social Worker [phone]

## 2018-07-06 NOTE — Progress Notes (Signed)
Nutrition Follow-up  DOCUMENTATION CODES:   Obesity unspecified  INTERVENTION:   Plan to place post-pyloric Cortrak tube today  Post -Cortrak placement:  Recommend advancing Vital High Protein TF by 5 mL q 4 hours until goal rate of 45 ml/hr Add Pro-Stat 30 mL daily Add MVI daily Provides 1180 kcals, 110 g of protein   NUTRITION DIAGNOSIS:   Inadequate oral intake related to acute illness as evidenced by NPO status.  Being addressed via TF   GOAL:   Patient will meet greater than or equal to 90% of their needs  Progressing  MONITOR:   Vent status, TF tolerance, Labs, Weight trends  REASON FOR ASSESSMENT:   Consult, Ventilator Enteral/tube feeding initiation and management  ASSESSMENT:   62 yo female admitted 7/9 with weakness, anorexia and lethargy with NASH cirrhosis with hepatic encephalopathy, new onset Afib with RVR, new onset HFrEF 30-35%, anemia. On 7/15,  pt's condition worsened with increased confusion/lethargy, tardive dyskinesia symptoms, AKI, shock liver and respiratory failure requiring intubation.  Pt with hx of HTN, HLD, seizure disorder, left breast cancer s/p lumpectomy 2008, GERD, depression, DM  Vital High Protein started at 20 ml/hr yesterday via OG tube, free water 200 mL q 4 hours, increased to 350 mL q 4 hours per MD today  Weight continues to trend up; utilizing EDW of 73 kg. Current wt 85 kg. Net positive 12 L per I/O flow sheet; weight gain all related to fluid  Noted pt with 1080 mL stool documented yesterday, 200 mL thus far today  Noted per Lilia Pro RN, pt keeps sliding down in bed, hard to keep HOB >30 degrees. Possible spitting up during bath today, TF on hold during this time. Pt would likely benefit from post-pyloric feeding tube placement.    Labs: sodium 155 (H), potassium 2.4 (L), BUN 24, Creatinine 1.06 Meds: lactulose, xifaxan, KCl   Diet Order:   Diet Order           Diet NPO time specified  Diet effective now           EDUCATION NEEDS:   Education needs have been addressed  Skin:  Skin Assessment: Reviewed RN Assessment(no pressure ulcers documented)  Last BM:  7/22 diarrhea, rectal tube  Height:   Ht Readings from Last 1 Encounters:  07/04/18 5\' 2"  (1.575 m)    Weight:   Wt Readings from Last 1 Encounters:  07/06/18 187 lb 6.3 oz (85 kg)    Ideal Body Weight:  50 kg  BMI:  Body mass index is 34.27 kg/m.  Estimated Nutritional Needs:   Kcal:  1000-1200 kcals; 1151 kcals (70% [employer])  Protein:  100-115 g  Fluid:  >/= 1.4 L   Kerman Passey MS, RD, LDN, CNSC [phone] Pager  [phone] Weekend/On-Call Pager

## 2018-07-06 NOTE — Progress Notes (Signed)
Cortrak Tube Team Note:  Consult received to place a Cortrak feeding tube.   A 10 F Cortrak tube was placed in the R nare and secured with a nasal bridle at 78 cm. Per the Cortrak monitor reading the tube tip is post-pyloric.   X-ray is required, abdominal x-ray has been ordered by the Cortrak team. Please confirm tube placement before using the Cortrak tube.   If the tube becomes dislodged please keep the tube and contact the Cortrak team at [email or web address] ([email or web address]) for replacement.  If after hours and replacement cannot be delayed, place a NG tube and confirm placement with an abdominal x-ray.      Jarome Matin, MS, RD, LDN, [facility] In[facility] Clinical Dietitian Pager # [phone] After hours/weekend pager # [phone]

## 2018-07-06 NOTE — Progress Notes (Signed)
Pt. receiving 1 unit PRBCs. Starting temp 98.59F with current temp 101.34F. CCM notified and ibuprofen to be ordered. Order for PRBCs to continue infusing.

## 2018-07-07 ENCOUNTER — Inpatient Hospital Stay (HOSPITAL_COMMUNITY): Payer: Medicare Other

## 2018-07-07 LAB — CBC WITH DIFFERENTIAL/PLATELET
BASOS ABS: 0 10*3/uL (ref 0.0–0.1)
Basophils Relative: 0 %
EOS ABS: 0 10*3/uL (ref 0.0–0.7)
Eosinophils Relative: 0 %
HEMATOCRIT: 30 % — AB (ref 36.0–46.0)
HEMOGLOBIN: 8.8 g/dL — AB (ref 12.0–15.0)
LYMPHS PCT: 10 %
Lymphs Abs: 1.3 10*3/uL (ref 0.7–4.0)
MCH: 20.7 pg — ABNORMAL LOW (ref 26.0–34.0)
MCHC: 29.3 g/dL — AB (ref 30.0–36.0)
MCV: 70.6 fL — ABNORMAL LOW (ref 78.0–100.0)
MONOS PCT: 7 %
Monocytes Absolute: 0.9 10*3/uL (ref 0.1–1.0)
NEUTROS PCT: 83 %
Neutro Abs: 11.1 10*3/uL — ABNORMAL HIGH (ref 1.7–7.7)
Platelets: 108 10*3/uL — ABNORMAL LOW (ref 150–400)
RBC: 4.25 MIL/uL (ref 3.87–5.11)
RDW: 24.7 % — ABNORMAL HIGH (ref 11.5–15.5)
WBC: 13.3 10*3/uL — AB (ref 4.0–10.5)

## 2018-07-07 LAB — POCT I-STAT 3, ART BLOOD GAS (G3+)
Bicarbonate: 23.2 mmol/L (ref 20.0–28.0)
O2 SAT: 99 %
TCO2: 24 mmol/L (ref 22–32)
pCO2 arterial: 31.4 mmHg — ABNORMAL LOW (ref 32.0–48.0)
pH, Arterial: 7.479 — ABNORMAL HIGH (ref 7.350–7.450)
pO2, Arterial: 121 mmHg — ABNORMAL HIGH (ref 83.0–108.0)

## 2018-07-07 LAB — GLUCOSE, CAPILLARY
GLUCOSE-CAPILLARY: 150 mg/dL — AB (ref 70–99)
GLUCOSE-CAPILLARY: 154 mg/dL — AB (ref 70–99)
GLUCOSE-CAPILLARY: 157 mg/dL — AB (ref 70–99)
GLUCOSE-CAPILLARY: 210 mg/dL — AB (ref 70–99)
Glucose-Capillary: 129 mg/dL — ABNORMAL HIGH (ref 70–99)
Glucose-Capillary: 159 mg/dL — ABNORMAL HIGH (ref 70–99)
Glucose-Capillary: 155 mg/dL — ABNORMAL HIGH (ref 70–99)

## 2018-07-07 LAB — BLOOD GAS, ARTERIAL
ACID-BASE EXCESS: 2.4 mmol/L — AB (ref 0.0–2.0)
BICARBONATE: 25.2 mmol/L (ref 20.0–28.0)
Drawn by: 511911
FIO2: 40
LHR: 12 {breaths}/min
O2 Saturation: 99 %
PEEP: 5 cmH2O
Patient temperature: 98.6
VT: 400 mL
pCO2 arterial: 31.4 mmHg — ABNORMAL LOW (ref 32.0–48.0)
pH, Arterial: 7.516 — ABNORMAL HIGH (ref 7.350–7.450)
pO2, Arterial: 126 mmHg — ABNORMAL HIGH (ref 83.0–108.0)

## 2018-07-07 LAB — COMPREHENSIVE METABOLIC PANEL
ALK PHOS: 220 U/L — AB (ref 38–126)
ALT: 164 U/L — AB (ref 0–44)
AST: 41 U/L (ref 15–41)
Albumin: 2.8 g/dL — ABNORMAL LOW (ref 3.5–5.0)
Anion gap: 8 (ref 5–15)
BILIRUBIN TOTAL: 4.7 mg/dL — AB (ref 0.3–1.2)
BUN: 31 mg/dL — ABNORMAL HIGH (ref 8–23)
CALCIUM: 8.2 mg/dL — AB (ref 8.9–10.3)
CHLORIDE: 119 mmol/L — AB (ref 98–111)
CO2: 26 mmol/L (ref 22–32)
CREATININE: 1.04 mg/dL — AB (ref 0.44–1.00)
GFR, EST NON AFRICAN AMERICAN: 56 mL/min — AB (ref >60)
Glucose, Bld: 162 mg/dL — ABNORMAL HIGH (ref 70–99)
Potassium: 3.2 mmol/L — ABNORMAL LOW (ref 3.5–5.1)
Sodium: 153 mmol/L — ABNORMAL HIGH (ref 135–145)
TOTAL PROTEIN: 4.8 g/dL — AB (ref 6.5–8.1)

## 2018-07-07 LAB — TYPE AND SCREEN
ABO/RH(D): O POS
Antibody Screen: NEGATIVE
Unit division: 0

## 2018-07-07 LAB — BPAM RBC
Blood Product Expiration Date: 201907292359
ISSUE DATE / TIME: 201907220819
Unit Type and Rh: 5100

## 2018-07-07 LAB — PHOSPHORUS: PHOSPHORUS: 2.7 mg/dL (ref 2.5–4.6)

## 2018-07-07 LAB — PROCALCITONIN: PROCALCITONIN: 1.17 ng/mL

## 2018-07-07 LAB — MAGNESIUM: Magnesium: 1.5 mg/dL — ABNORMAL LOW (ref 1.7–2.4)

## 2018-07-07 MED ORDER — MIDAZOLAM HCL 2 MG/2ML IJ SOLN
2.0000 mg | INTRAMUSCULAR | Status: DC | PRN
  Administered 2018-07-07 – 2018-07-08 (×3): 2 mg via INTRAVENOUS
  Filled 2018-07-07 (×3): qty 2

## 2018-07-07 MED ORDER — FREE WATER
400.0000 mL | Status: DC
  Administered 2018-07-07 – 2018-07-08 (×6): 400 mL

## 2018-07-07 MED ORDER — POTASSIUM CHLORIDE 20 MEQ/15ML (10%) PO SOLN
40.0000 meq | Freq: Two times a day (BID) | ORAL | Status: AC
  Administered 2018-07-07 (×2): 40 meq
  Filled 2018-07-07 (×2): qty 30

## 2018-07-07 MED ORDER — ATROPINE SULFATE 1 MG/10ML IJ SOSY
PREFILLED_SYRINGE | INTRAMUSCULAR | Status: AC
  Filled 2018-07-07: qty 10

## 2018-07-07 MED ORDER — FENTANYL CITRATE (PF) 100 MCG/2ML IJ SOLN
25.0000 ug | INTRAMUSCULAR | Status: DC | PRN
  Administered 2018-07-07 – 2018-07-08 (×2): 25 ug via INTRAVENOUS
  Filled 2018-07-07 (×2): qty 2

## 2018-07-07 MED ORDER — METOPROLOL TARTRATE 25 MG/10 ML ORAL SUSPENSION
25.0000 mg | Freq: Two times a day (BID) | ORAL | Status: DC
  Administered 2018-07-07 – 2018-07-10 (×8): 25 mg
  Filled 2018-07-07 (×7): qty 10

## 2018-07-07 MED ORDER — MAGNESIUM SULFATE 2 GM/50ML IV SOLN
2.0000 g | Freq: Once | INTRAVENOUS | Status: AC
  Administered 2018-07-07: 2 g via INTRAVENOUS
  Filled 2018-07-07: qty 50

## 2018-07-07 MED ORDER — BUSPIRONE HCL 5 MG PO TABS
10.0000 mg | ORAL_TABLET | Freq: Every day | ORAL | Status: DC
  Administered 2018-07-07 – 2018-07-11 (×5): 10 mg via ORAL
  Filled 2018-07-07 (×5): qty 2

## 2018-07-07 NOTE — Progress Notes (Addendum)
Pt breathing close to 40/min for the past 2 hours. HR and BP have gone up over shift and pt has been in ST last 4 hours.  On full vent support, have repositioned, given ice packs and ibuprofen w/ no relief. RT at bedside. Spoke w/ Dr. Vaughan Browner and got verbal orders for fentanyl and versed PRN

## 2018-07-07 NOTE — Progress Notes (Signed)
Pt w/ episode bradycardia (30s-40s) this AM (sustained from~0822-0824, then on and off until ~0830) Attempted to get 12 lead EKG, however poor quality due to patient movement. Was able to obtain EKG once pt back in NSR which showed 1st degree AV block. Pt switched back from CPAP to PRVC. Currently in NSR w/ BBB and 1st degree AVB. Cardiology has signed off. Will continue to monitor

## 2018-07-07 NOTE — Progress Notes (Signed)
PULMONARY / CRITICAL CARE MEDICINE   Name: Brandi [name] MRN: [id number] DOB: 11/07/1956    ADMISSION DATE:  06/23/2018 CONSULTATION DATE: 06/29/2018  REFERRING MD: Dr. Bonner Puna  CHIEF COMPLAINT: Altered mental status  HISTORY OF PRESENT ILLNESS:  62 y/o F who presented to [facility] on 7/9 with abdominal pain, nausea, vomiting, decreased oral intake and weakness.    She was reportedly evaluated by her PCP prior to admit with elevated LFT's (AST 302, ALT 611, AP 380).  Follow up CT of the abdomen demonstrated prominent caudate lobe and surface irregularity of the liver.   In the ER, US of the abdomen confirmed the CT findings of the liver.  She was noted to be lethargic and have asterixis on exam.  Ammonia 22 (7/10) > 38 (7/12).  The patient had AFwRVR and was treated with cardizem.  She was admitted for further evaluation.  The patient was seen by Cardiology and transitioned to metoprolol for rate control due to decreased LVEF (30-35%). Labs showed a microcytic anemia with ferritin of 9.  GI was consulted for evaluation with recommendations for lactulose and eventual endoscopic evaluation.  She had ongoing encephalopathy and a CT of the head was obtained which showed a right frontal operculum CVA.  Neurology evaluated the patient and felt the stroke was likely related to AF but not the cause of her mental status.  She was recommended anticoagulation and passed a swallow evaluation converting from heparin to eliquis.  On 7/15, her confusion, tardive dyskinesia symptoms and lethargy worsened as well as her LFT's (AST 3942, ALT 1976, ALK Phos 404), renal function and WBC.  PCCM consulted for evaluation.     SUBJECTIVE:  RN reports pt had 800 ml in flexiseal overnight.  Off home PSY meds. Mg/K low.  Na 153 on free water. Continues to have tardive dyskinesia.     VITAL SIGNS: BP 95/72   Pulse 100   Temp 98.3 F (36.8 C) (Oral)   Resp (!) 28   Ht _0  (1.575 m)   Wt 170 lb 13.7 oz (77.5 kg)   SpO2  100%   BMI 31.25 kg/m   HEMODYNAMICS:    VENTILATOR SETTINGS: Vent Mode: PRVC FiO2 (%):  [40 %] 40 % Set Rate:  [12 bmp] 12 bmp Vt Set:  [400 mL] 400 mL PEEP:  [5 cmH20] 5 cmH20 Pressure Support:  [5 cmH20] 5 cmH20 Plateau Pressure:  [14 cmH20-20 cmH20] 20 cmH20  INTAKE / OUTPUT:  Intake/Output Summary (Last 24 hours) at 07/07/2018 0918 Last data filed at 07/07/2018 0900 Gross per 24 hour  Intake 2213.69 ml  Output 3001 ml  Net -787.31 ml    PHYSICAL EXAMINATION: General:  Ill appearing female in NAD on vent HEENT: MM pink/moist, ETT, L IJ TLC c/d/i Neuro: eyes open, no follow commands,  CV: s1s2 rrr, no m/r/g, tachy  PULM: even/non-labored, lungs bilaterally coarse, diminished bases  AU:QJFH, non-tender, bsx4 active  Extremities: warm/dry, generalized 1-2+ edema  Skin: no rashes or lesions  LABS:  BMET Recent Labs  Lab 07/06/18 1533 07/06/18 1956 07/07/18 0420  NA 155* 153* 153*  K 4.3 3.7 3.2*  CL 118* 115* 119*  CO2 _1 BUN 25* 27* 31*  CREATININE 1.06* 1.09* 1.04*  GLUCOSE 155* 182* 162*    Electrolytes Recent Labs  Lab 07/01/18 0341  07/03/18 0911  07/06/18 1533 07/06/18 1956 07/07/18 0420  CALCIUM 8.1*   < > 8.0*   < > 8.6* 8.5* 8.2*  MG 1.7  --  1.7  --   --   --  1.5*  PHOS 1.3*  --  3.7  --   --   --  2.7   < > = values in this interval not displayed.    CBC Recent Labs  Lab 07/05/18 0333 07/06/18 0424 07/07/18 0420  WBC 14.0* 8.0 13.3*  HGB 7.7* 6.9* 8.8*  HCT 26.8* 24.0* 30.0*  PLT 105* 100* 108*    Coag's Recent Labs  Lab 07/04/18 0234 07/04/18 0352 07/06/18 0424  APTT  --  22*  --   INR 1.82 1.86 1.65    Sepsis Markers Recent Labs  Lab 07/04/18 0345 07/05/18 1151 07/06/18 0424 07/07/18 0420  LATICACIDVEN 2.0*  --   --   --   PROCALCITON  --  2.03 1.85 1.17    ABG Recent Labs  Lab 07/04/18 0218 07/07/18 0400  PHART 7.531* 7.516*  PCO2ART 29.8* 31.4*  PO2ART 73.0* 126*    Liver Enzymes Recent  Labs  Lab 07/05/18 0333 07/06/18 0424 07/07/18 0420  AST 83* 48* 41  ALT 335* 216* 164*  ALKPHOS 259* 220* 220*  BILITOT 3.9* 4.2* 4.7*  ALBUMIN 2.0* 3.1* 2.8*    Cardiac Enzymes No results for input(s): TROPONINI, PROBNP in the last 168 hours.  Glucose Recent Labs  Lab 07/06/18 1543 07/06/18 1811 07/06/18 1958 07/07/18 0041 07/07/18 0404 07/07/18 0746  GLUCAP 152* 172* 158* 150* 157* 129*    Imaging  STUDIES:  [place] ABD 7/6 >> possible cirrhosis, gallbladder surgically absent CT Head 7/9 >> atrophy with small vessel chronic ischemic changes of deep cerebral white matter CT Renal Study 7/9 >> no acute findings, bilateral non-obstructing renal stones ECHO 7/9 >> LV mildly dilated, wall thickness was increased in a pattern of moderate LVH, LVEF 30-35%, severe hypokinesis of the anteroseptal, anterior, inferoseptal & apical myocardium, mild to moderate MV regurgitation, LA severely dilated, RV moderately dilated, RA mod-severely dilated, PA pressure 35mHg, trivial pericardial effusion. CTA Head, Neck 7/12 >> minimal calcified plaque at the carotid bifurcation regions but no stenosis or irregularity, no large vessel occlusions, unable to specifically identify the missing branch vessel responsible for the right posterior frontal infarction but this is presumably an occluded M3 or distal branch CT Head w/o 7/12 >> the small right middle cerebral artery distribution, posterolateral frontal lobe infarct has evolved since the prior CT, it is now seen as a well defined area of hypoattenuation, no new areas of infarct, no ICH CT head 7/15 >> Redemonstration of right posterior frontal lobe infarct. No acute intracranial hemorrhage or new infarct identified.  Atrophy with chronic small vessel ischemia. CT abd/pelvis 7/18 > Diffuse dilation of the colon with large amount of ascending colonic stool.   CULTURES: BCx2 7/10 >> negative  Cdiff 7/20 >> negative  Sputum 7/20 >>  negative  ANTIBIOTICS: Vanco 7/15 >> 7/21 Zoysn 7/15 >> 7/22  SIGNIFICANT EVENTS: 7/09  Admit  7/15  PCCM consulted with AMS, acute liver failure 7/19  Had been receiving fluid challenges for hypotension. 7/20   Early in the a.m. hours drop blood pressure.  Placed on pressors.  Non-anion gap acidosis noted.  Cortisol 29.2 CBC remains elevated blood cell count remains elevated.  7/21  no significant fever.  White cell count cut in half.  Chest x-ray looks improved.  We resumed lactulose and rifaximin yesterday, also resumed spironolactone.  Still obtunded  7/22  Free water increased  LINES/TUBES: ETT 7/15 >>   DISCUSSION:  62 y/o F admitted with weakness, decreased intake and lethargy.  Work up concerning for NASH, [place].  She developed AMS > small infarct thought not related to AMS.  Recommended for anticoagulation by Cardiology/Neurology, was transitioned from heparin to eliquis.  The patient deteriorated on 7/15 with a rise in LFT's, AKI, confusion/AMS, worsening of tardive dyskinesia type symptoms.  Transferred to ICU.    ICU course significant for ongoing issues with mental status which is a major barrier to extubation.  Continues on lactulose, rifaximin.  ASSESSMENT / PLAN:  Acute Respiratory Insufficiency - in setting of liver failure  Bilateral Pleural Effusions - suspect in setting of liver disease, HF, AKI P: PRVC 8 cc/kg > ask RT to re-measure the patient to confirm 8 cc Follow intermittent CXR  Daily SBT as tolerated   Acute metabolic & hepatic  encephalopathy/ Acute delirium CVA - Right Frontal Operculum infarct - felt related to AF/embolic but not all cause of mental status Right-sided weakness Seizure disorder Schizophrenia Tardive Dyskinesia  P: Resume reduced dose Buspar Hold home effexor  Continue Keppra, Vimpat, Lactulose, Rifaximin  Frequent neuro exams  Limit sedating medications  Hold treatment for tardive dyskinesia (benzo's or hepatic  clearance)  Ileus - improving P: TF per Nutrition  Increase rate back to 56m/hr as tolerated   HFrEF A. fib with RVR HTN Bradycardia - into the 20's 7/23 am -Had transient hypotension the a.m. of 7/20.  Etiology not entirely clear if this required pressors transiently now hypertensive without pressors - mildly elevated troponin's; EKG unchanged  -cardiology following AC held d/t liver dysfxn P: Reduce dose of lopressor to 25 mg BID with bradycardia  Hold anticoagulation due to bleeding risk with hepatic dysfunction  Continue spironolactone   AKI - in setting of liver injury, NASH P: Trend BMP / urinary output Replace electrolytes as indicated > aggressively replace Avoid nephrotoxic agents, ensure adequate renal perfusion Increase free water to 400 ml Q4   Fluid and Electrolyte imbalance: hypernatremia, Hypokalemia & volume overload, and hypernatremia with hyperchloremia P: Free water as above  Electrolyte replacement as above   NKarlene LinemanCirrhosis with Hepatic Encephalopathy  - unclear in what precipitated change in events LFTs/ decompensation 7/15 - 7/15 [place]iver w/doppler with no evidence of portal venous thrombosis  -LFTs without significant change P: Follow intermittent LFT's  Lactulose as above  Anemia  P: PPI BID Transfuse per ICU guidelines   Leukocytosis, questionable sepsis however etiology unclear.  Question of spontaneous bacterial peritonitis P: Monitor off abx  Coagulopathy, and thrombocytopenia - s/p 3 units FFP 7/16 am P: Follow INR No anticoagulation with bleeding risk   Hx Breast Cancer P: Outpatient follow up     DM 2 P: SSI   DVT prophylaxis: SCD's SUP: PPI Diet: NPO Activity: BR Disposition : ICU  FAMILY  - Updates: No family available am 7/23.  Will update on arrival.   BNoe Gens NP-C [place] [facility] Pgr: [phone] or if no answer [phone]/23/2019, 9:18 AM

## 2018-07-08 ENCOUNTER — Inpatient Hospital Stay (HOSPITAL_COMMUNITY): Payer: Medicare Other

## 2018-07-08 LAB — BASIC METABOLIC PANEL
Anion gap: 7 (ref 5–15)
Anion gap: 9 (ref 5–15)
BUN: 32 mg/dL — ABNORMAL HIGH (ref 8–23)
BUN: 31 mg/dL — AB (ref 8–23)
CALCIUM: 8.4 mg/dL — AB (ref 8.9–10.3)
CHLORIDE: 123 mmol/L — AB (ref 98–111)
CO2: 26 mmol/L (ref 22–32)
CO2: 19 mmol/L — ABNORMAL LOW (ref 22–32)
Calcium: 8.1 mg/dL — ABNORMAL LOW (ref 8.9–10.3)
Chloride: 129 mmol/L — ABNORMAL HIGH (ref 98–111)
Creatinine, Ser: 0.85 mg/dL (ref 0.44–1.00)
Creatinine, Ser: 0.72 mg/dL (ref 0.44–1.00)
GFR calc Af Amer: >60 mL/min (ref >60)
GLUCOSE: 148 mg/dL — AB (ref 70–99)
Glucose, Bld: 175 mg/dL — ABNORMAL HIGH (ref 70–99)
POTASSIUM: 2.6 mmol/L — AB (ref 3.5–5.1)
Potassium: 6.2 mmol/L — ABNORMAL HIGH (ref 3.5–5.1)
SODIUM: 156 mmol/L — AB (ref 135–145)
Sodium: 157 mmol/L — ABNORMAL HIGH (ref 135–145)

## 2018-07-08 LAB — CBC
HCT: 30.9 % — ABNORMAL LOW (ref 36.0–46.0)
HCT: 31.2 % — ABNORMAL LOW (ref 36.0–46.0)
HEMOGLOBIN: 8.9 g/dL — AB (ref 12.0–15.0)
HEMOGLOBIN: 9.1 g/dL — AB (ref 12.0–15.0)
MCH: 21 pg — ABNORMAL LOW (ref 26.0–34.0)
MCH: 21.2 pg — AB (ref 26.0–34.0)
MCHC: 28.8 g/dL — AB (ref 30.0–36.0)
MCHC: 29.2 g/dL — AB (ref 30.0–36.0)
MCV: 72.9 fL — ABNORMAL LOW (ref 78.0–100.0)
MCV: 72.7 fL — ABNORMAL LOW (ref 78.0–100.0)
PLATELETS: 127 10*3/uL — AB (ref 150–400)
Platelets: 110 10*3/uL — ABNORMAL LOW (ref 150–400)
RBC: 4.24 MIL/uL (ref 3.87–5.11)
RBC: 4.29 MIL/uL (ref 3.87–5.11)
RDW: 26.4 % — ABNORMAL HIGH (ref 11.5–15.5)
RDW: 26.4 % — ABNORMAL HIGH (ref 11.5–15.5)
WBC: 15.4 10*3/uL — ABNORMAL HIGH (ref 4.0–10.5)
WBC: 17.3 10*3/uL — ABNORMAL HIGH (ref 4.0–10.5)

## 2018-07-08 LAB — MAGNESIUM: MAGNESIUM: 2.2 mg/dL (ref 1.7–2.4)

## 2018-07-08 LAB — GLUCOSE, CAPILLARY
GLUCOSE-CAPILLARY: 173 mg/dL — AB (ref 70–99)
GLUCOSE-CAPILLARY: 157 mg/dL — AB (ref 70–99)
Glucose-Capillary: 127 mg/dL — ABNORMAL HIGH (ref 70–99)
Glucose-Capillary: 145 mg/dL — ABNORMAL HIGH (ref 70–99)
Glucose-Capillary: 152 mg/dL — ABNORMAL HIGH (ref 70–99)
Glucose-Capillary: 156 mg/dL — ABNORMAL HIGH (ref 70–99)

## 2018-07-08 MED ORDER — FREE WATER
450.0000 mL | Status: DC
  Administered 2018-07-08 – 2018-07-12 (×22): 450 mL

## 2018-07-08 MED ORDER — POTASSIUM CHLORIDE 20 MEQ/15ML (10%) PO SOLN
40.0000 meq | ORAL | Status: AC
  Administered 2018-07-08 (×3): 40 meq
  Filled 2018-07-08 (×3): qty 30

## 2018-07-08 MED ORDER — ALTEPLASE 2 MG IJ SOLR
2.0000 mg | Freq: Once | INTRAMUSCULAR | Status: AC
  Administered 2018-07-08: 2 mg
  Filled 2018-07-08: qty 2

## 2018-07-08 NOTE — Progress Notes (Signed)
CRITICAL VALUE ALERT  Critical Value: Potassium 2.6  Date & Time Notied:  07/08/18 0502  Provider Notified: Warren Lacy nurse  Orders Received/Actions taken: 40Kmeq per tube  given

## 2018-07-08 NOTE — Progress Notes (Signed)
[facility] ADULT ICU REPLACEMENT PROTOCOL FOR AM LAB REPLACEMENT ONLY  The patient does apply for the [facility] Adult ICU Electrolyte Replacment Protocol based on the criteria listed below:   1. Is GFR >/= 40 ml/min? Yes.    Patient's GFR today is >60 2. Is urine output >/= 0.5 ml/kg/hr for the last 6 hours? Yes.   Patient's UOP is 0.82 ml/kg/hr 3. Is BUN < 60 mg/dL? Yes.    Patient's BUN today is 32 4. Abnormal electrolyte  K 2.6 5. Ordered repletion with: per protocol 6. If a panic level lab has been reported, has the CCM MD in charge been notified? Yes.  .   Physician:  E Deterding  Christeen Douglas 07/08/2018 5:14 AM

## 2018-07-08 NOTE — Progress Notes (Signed)
RT note: patient placed on CPAP/PSV of 5/5 at 0800.  Currently tolerating well.  Will continue to monitor.  

## 2018-07-08 NOTE — Progress Notes (Signed)
PULMONARY / CRITICAL CARE MEDICINE   Name: Brandi [name] MRN: [id number] DOB: 1956/01/18    ADMISSION DATE:  06/23/2018 CONSULTATION DATE: 06/29/2018  REFERRING MD: Dr. Bonner Puna  CHIEF COMPLAINT: Altered mental status  HISTORY OF PRESENT ILLNESS:  62 y/o F who presented to [facility] on 7/9 with abdominal pain, nausea, vomiting, decreased oral intake and weakness.    She was reportedly evaluated by her PCP prior to admit with elevated LFT's (AST 302, ALT 611, AP 380).  Follow up CT of the abdomen demonstrated prominent caudate lobe and surface irregularity of the liver.   In the ER, US of the abdomen confirmed the CT findings of the liver.  She was noted to be lethargic and have asterixis on exam.  Ammonia 22 (7/10) > 38 (7/12).  The patient had AFwRVR and was treated with cardizem.  She was admitted for further evaluation.  The patient was seen by Cardiology and transitioned to metoprolol for rate control due to decreased LVEF (30-35%). Labs showed a microcytic anemia with ferritin of 9.  GI was consulted for evaluation with recommendations for lactulose and eventual endoscopic evaluation.  She had ongoing encephalopathy and a CT of the head was obtained which showed a right frontal operculum CVA.  Neurology evaluated the patient and felt the stroke was likely related to AF but not the cause of her mental status.  She was recommended anticoagulation and passed a swallow evaluation converting from heparin to eliquis.  On 7/15, her confusion, tardive dyskinesia symptoms and lethargy worsened as well as her LFT's (AST 3942, ALT 1976, ALK Phos 404), renal function and WBC.  PCCM consulted for evaluation.     SUBJECTIVE:  Tmax 100.2.  I/O- positive 8.6L for admit / 2125 of stool out 7/23.    VITAL SIGNS: BP (!) 148/94   Pulse (!) 102   Temp 98.6 F (37 C) (Oral)   Resp (!) 26   Ht '5\' 2"'  (1.575 m)   Wt 167 lb 12.3 oz (76.1 kg)   SpO2 100%   BMI 30.69 kg/m   HEMODYNAMICS:    VENTILATOR  SETTINGS: Vent Mode: PSV;CPAP FiO2 (%):  [40 %] 40 % Set Rate:  [12 bmp] 12 bmp Vt Set:  [350 mL] 350 mL PEEP:  [5 cmH20] 5 cmH20 Pressure Support:  [5 cmH20] 5 cmH20 Plateau Pressure:  [13 cmH20-15 cmH20] 15 cmH20  INTAKE / OUTPUT:  Intake/Output Summary (Last 24 hours) at 07/08/2018 0829 Last data filed at 07/08/2018 0800 Gross per 24 hour  Intake 3152.95 ml  Output 3045 ml  Net 107.95 ml    PHYSICAL EXAMINATION: General: chronically ill appearing female lying in bed on vent, disheveled  HEENT: MM pink/moist, ETT, tardive dyskinesia / chronic movement of tongue Neuro: much more alert, makes eye contact, follows simple commands, nods  CV: s1s2 rrr, no m/r/g PULM: even/non-labored, lungs bilaterally coarse  PF:XTKW, non-tender, bsx4 active  Extremities: warm/dry, generalized 1+ edema  Skin: no rashes or lesions  LABS:  BMET Recent Labs  Lab 07/06/18 1956 07/07/18 0420 07/08/18 0346  NA 153* 153* 156*  K 3.7 3.2* 2.6*  CL 115* 119* 123*  CO2 '25 26 26  ' BUN 27* 31* 32*  CREATININE 1.09* 1.04* 0.85  GLUCOSE 182* 162* 175*    Electrolytes Recent Labs  Lab 07/03/18 0911  07/06/18 1956 07/07/18 0420 07/08/18 0346  CALCIUM 8.0*   < > 8.5* 8.2* 8.1*  MG 1.7  --   --  1.5* 2.2  PHOS 3.7  --   --  2.7  --    < > = values in this interval not displayed.    CBC Recent Labs  Lab 07/06/18 0424 07/07/18 0420 07/08/18 0346  WBC 8.0 13.3* 15.4*  HGB 6.9* 8.8* 8.9*  HCT 24.0* 30.0* 30.9*  PLT 100* 108* 110*    Coag's Recent Labs  Lab 07/04/18 0234 07/04/18 0352 07/06/18 0424  APTT  --  22*  --   INR 1.82 1.86 1.65    Sepsis Markers Recent Labs  Lab 07/04/18 0345 07/05/18 1151 07/06/18 0424 07/07/18 0420  LATICACIDVEN 2.0*  --   --   --   PROCALCITON  --  2.03 1.85 1.17    ABG Recent Labs  Lab 07/04/18 0218 07/07/18 0400 07/07/18 1434  PHART 7.531* 7.516* 7.479*  PCO2ART 29.8* 31.4* 31.4*  PO2ART 73.0* 126* 121.0*    Liver  Enzymes Recent Labs  Lab 07/05/18 0333 07/06/18 0424 07/07/18 0420  AST 83* 48* 41  ALT 335* 216* 164*  ALKPHOS 259* 220* 220*  BILITOT 3.9* 4.2* 4.7*  ALBUMIN 2.0* 3.1* 2.8*    Cardiac Enzymes No results for input(s): TROPONINI, PROBNP in the last 168 hours.  Glucose Recent Labs  Lab 07/07/18 1128 07/07/18 1529 07/07/18 1945 07/07/18 2313 07/08/18 0411 07/08/18 0726  GLUCAP 159* 155* 154* 210* 173* 145*    Imaging  STUDIES:  [place] ABD 7/6 >> possible cirrhosis, gallbladder surgically absent CT Head 7/9 >> atrophy with small vessel chronic ischemic changes of deep cerebral white matter CT Renal Study 7/9 >> no acute findings, bilateral non-obstructing renal stones ECHO 7/9 >> LV mildly dilated, wall thickness was increased in a pattern of moderate LVH, LVEF 30-35%, severe hypokinesis of the anteroseptal, anterior, inferoseptal & apical myocardium, mild to moderate MV regurgitation, LA severely dilated, RV moderately dilated, RA mod-severely dilated, PA pressure 24mHg, trivial pericardial effusion. CTA Head, Neck 7/12 >> minimal calcified plaque at the carotid bifurcation regions but no stenosis or irregularity, no large vessel occlusions, unable to specifically identify the missing branch vessel responsible for the right posterior frontal infarction but this is presumably an occluded M3 or distal branch CT Head w/o 7/12 >> the small right middle cerebral artery distribution, posterolateral frontal lobe infarct has evolved since the prior CT, it is now seen as a well defined area of hypoattenuation, no new areas of infarct, no ICH CT head 7/15 >> Redemonstration of right posterior frontal lobe infarct. No acute intracranial hemorrhage or new infarct identified.  Atrophy with chronic small vessel ischemia. CT abd/pelvis 7/18 > Diffuse dilation of the colon with large amount of ascending colonic stool.   CULTURES: BCx2 7/10 >> negative  Cdiff 7/20 >> negative  Sputum 7/20 >>  negative  ANTIBIOTICS: Vanco 7/15 >> 7/21 Zoysn 7/15 >> 7/22  SIGNIFICANT EVENTS: 7/09  Admit  7/15  PCCM consulted with AMS, acute liver failure 7/19  Had been receiving fluid challenges for hypotension. 7/20   Early in the a.m. hours drop blood pressure.  Placed on pressors.  Non-anion gap acidosis noted.  Cortisol 29.2 CBC remains elevated blood cell count remains elevated.  7/21  no significant fever.  White cell count cut in half.  Chest x-ray looks improved.  We resumed lactulose and rifaximin yesterday, also resumed spironolactone.  Still obtunded  7/22  Free water increased 7/23  800 ml in flexiseal overnight.  Off home PSY meds. Mg/K low.  Na 153 on free water. Continues to have tardive dyskinesia.   LINES/TUBES: ETT 7/15 >>  DISCUSSION: 62 y/o F admitted with weakness, decreased intake and lethargy.  Work up concerning for NASH, [place].  She developed AMS > small infarct thought not related to AMS.  Recommended for anticoagulation by Cardiology/Neurology, was transitioned from heparin to eliquis.  The patient deteriorated on 7/15 with a rise in LFT's, AKI, confusion/AMS, worsening of tardive dyskinesia type symptoms.  Transferred to ICU.    ICU course significant for ongoing issues with mental status which is a major barrier to extubation.  Continues on lactulose, rifaximin.  ASSESSMENT / PLAN:  Acute Respiratory Insufficiency - in setting of liver failure  Bilateral Pleural Effusions - suspect in setting of liver disease, HF, AKI P: PRVC 8 cc/kg  Daily SBT  Follow intermittent CXR  Mental status improved 7/24 but not quite ready for extubation   Acute metabolic & hepatic  encephalopathy/ Acute delirium CVA - Right Frontal Operculum infarct - felt related to AF/embolic but not all cause of mental status Right-sided weakness Seizure disorder Schizophrenia Tardive Dyskinesia  P: Continue reduced dose Buspar  Hold home effexor  Continue Keppra, Vimpat, Lactulose,  Rifaximin  Frequent neuro exams  Limit sedation  Monitor tardive dyskinesia  Ileus - improving P: TF per Nutrition  PPI for SUP   HFrEF A. fib with RVR HTN Bradycardia - into the 20's 7/23 am -Had transient hypotension the a.m. of 7/20.  Etiology not entirely clear if this required pressors transiently now hypertensive without pressors - mildly elevated troponin's; EKG unchanged  -cardiology following AC held d/t liver dysfxn P: Continue reduced dose lopressor, 73m BID (had brady to 30's on 50 mg) Continue spironolactone  Hold anticoagulation with hepatic dysfunction   AKI - in setting of liver injury, NASH Hypernatremia / Hyperchloremia  Hypokalemia Volume Overload P: Increase free water to 450 ml Q4 Trend BMP / urinary output Replace electrolytes as indicated Avoid nephrotoxic agents, ensure adequate renal perfusion  NKarlene LinemanCirrhosis with Hepatic Encephalopathy  - unclear in what precipitated change in events LFTs/ decompensation 7/15 - 7/15 UKorealiver w/doppler with no evidence of portal venous thrombosis  -LFTs without significant change P: Follow intermittent LFT's  Lactulose as above  Anemia  P: Trend CBC  Transfuse per ICU guidelines   Leukocytosis, questionable sepsis however etiology unclear.  Question of spontaneous bacterial peritonitis P: Monitor off abx  Coagulopathy, and thrombocytopenia - s/p 3 units FFP 7/16 am P: Follow INR  No anticoagulation with bleeding risk / hepatic dysfunction  Hx Breast Cancer P: Follow up as outpatient     DM 2 P: SSI  DVT prophylaxis: SCD's SUP: PPI Diet: NPO Activity: BR Disposition : ICU  FAMILY  - Updates: No family available am 7/24.  Updated via phone per RN.  [place] after [place].    BNoe Gens NP-C [place] [facility] Pgr: [phone] or if no answer [phone]/24/2019, 8:29 AM

## 2018-07-09 ENCOUNTER — Inpatient Hospital Stay (HOSPITAL_COMMUNITY): Payer: Medicare Other

## 2018-07-09 LAB — COMPREHENSIVE METABOLIC PANEL
ALT: 119 U/L — ABNORMAL HIGH (ref 0–44)
AST: 56 U/L — AB (ref 15–41)
Albumin: 2.4 g/dL — ABNORMAL LOW (ref 3.5–5.0)
Alkaline Phosphatase: 199 U/L — ABNORMAL HIGH (ref 38–126)
Anion gap: 5 (ref 5–15)
BILIRUBIN TOTAL: 3.5 mg/dL — AB (ref 0.3–1.2)
BUN: 28 mg/dL — AB (ref 8–23)
CO2: 25 mmol/L (ref 22–32)
Calcium: 8.1 mg/dL — ABNORMAL LOW (ref 8.9–10.3)
Chloride: 126 mmol/L — ABNORMAL HIGH (ref 98–111)
Creatinine, Ser: 0.65 mg/dL (ref 0.44–1.00)
GFR calc Af Amer: >60 mL/min (ref >60)
Glucose, Bld: 172 mg/dL — ABNORMAL HIGH (ref 70–99)
Potassium: 2.9 mmol/L — ABNORMAL LOW (ref 3.5–5.1)
Sodium: 156 mmol/L — ABNORMAL HIGH (ref 135–145)
Total Protein: 4.6 g/dL — ABNORMAL LOW (ref 6.5–8.1)

## 2018-07-09 LAB — URINALYSIS, ROUTINE W REFLEX MICROSCOPIC
Bilirubin Urine: NEGATIVE
Glucose, UA: NEGATIVE mg/dL
Hgb urine dipstick: NEGATIVE
Ketones, ur: NEGATIVE mg/dL
LEUKOCYTES UA: NEGATIVE
NITRITE: NEGATIVE
PH: 5 (ref 5.0–8.0)
Protein, ur: NEGATIVE mg/dL
SPECIFIC GRAVITY, URINE: 1.021 (ref 1.005–1.030)

## 2018-07-09 LAB — GLUCOSE, CAPILLARY
GLUCOSE-CAPILLARY: 131 mg/dL — AB (ref 70–99)
GLUCOSE-CAPILLARY: 166 mg/dL — AB (ref 70–99)
GLUCOSE-CAPILLARY: 88 mg/dL (ref 70–99)
GLUCOSE-CAPILLARY: 148 mg/dL — AB (ref 70–99)
Glucose-Capillary: 96 mg/dL (ref 70–99)

## 2018-07-09 LAB — SODIUM, URINE, RANDOM: Sodium, Ur: 22 mmol/L

## 2018-07-09 LAB — OSMOLALITY, URINE: Osmolality, Ur: 690 mOsm/kg (ref 300–900)

## 2018-07-09 MED ORDER — METOPROLOL TARTRATE 5 MG/5ML IV SOLN: 2.5000 mg | INTRAVENOUS | Status: DC | PRN

## 2018-07-09 MED ORDER — POTASSIUM CHLORIDE 20 MEQ/15ML (10%) PO SOLN
40.0000 meq | ORAL | Status: AC
  Administered 2018-07-09 (×2): 40 meq
  Filled 2018-07-09 (×2): qty 30

## 2018-07-09 MED ORDER — POTASSIUM CHLORIDE 20 MEQ/15ML (10%) PO SOLN
40.0000 meq | Freq: Every day | ORAL | Status: DC
  Administered 2018-07-10 – 2018-07-12 (×3): 40 meq
  Filled 2018-07-09 (×3): qty 30

## 2018-07-09 MED ORDER — VITAL AF 1.2 CAL PO LIQD
1000.0000 mL | ORAL | Status: DC
  Administered 2018-07-09 – 2018-07-11 (×2): 1000 mL

## 2018-07-09 NOTE — Progress Notes (Signed)
Pt's HR 140's sustaining. EKG done ST with short PR, nonspecific intraventricular block. Informed Dr. Johnette Abraham. Deterding, instructed to give 1000 dose of lopressor now. Order carried out.

## 2018-07-09 NOTE — Progress Notes (Signed)
Nutrition Follow-up  DOCUMENTATION CODES:   Obesity unspecified  INTERVENTION:   Recommend continuing Tube Feeding until diet advaced and pt taking adequate po (meeting ~75% of needs)   Tube Feeding:  Change to Vital AF 1.2 @ 60 ml/hr Provides 1728 kcals, 108 g of protein and 1166 mL of free water flushes Meets 100% estimated calorie and protein needs   NUTRITION DIAGNOSIS:   Inadequate oral intake related to acute illness as evidenced by NPO status.  Being addressed via TF   GOAL:   Patient will meet greater than or equal to 90% of their needs  Met  MONITOR:   Vent status, TF tolerance, Labs, Weight trends  REASON FOR ASSESSMENT:   Consult, Ventilator Enteral/tube feeding initiation and management  ASSESSMENT:   62 yo female admitted 7/9 with weakness, anorexia and lethargy with NASH cirrhosis with hepatic encephalopathy, new onset Afib with RVR, new onset HFrEF 30-35%, anemia. On 7/15,  pt's condition worsened with increased confusion/lethargy, tardive dyskinesia symptoms, AKI, shock liver and respiratory failure requiring intubation.  Pt with hx of HTN, HLD, seizure disorder, left breast cancer s/p lumpectomy 2008, GERD, depression, DM  7/25 Extubated  Mental status starting to improve, remains NPO Tolerating Vital High Protein @ 45 ml/hr via Cortrak tube Free water flush of 450 mL q 4 hours, hypernatremia persists  Fleixseal with 2750 mL in 24 hours, 400 mL documented thus far today  Labs: sodium 156 (H), potassium 2.9 (L) Meds: liquid MVI, lactulose, xifaxan, KCl   Diet Order:   Diet Order           Diet NPO time specified  Diet effective now          EDUCATION NEEDS:   Education needs have been addressed  Skin:  Skin Assessment: Skin Integrity Issues: Skin Integrity Issues:: Other (Comment)(no pressure ulcers documented) Other: MASD:buttocks, groin, perineum  Last BM:  7/25 diarrhea, rectal tube  Height:   Ht Readings from Last 1  Encounters:  07/04/18 '5\' 2"'  (1.575 m)    Weight:   Wt Readings from Last 1 Encounters:  07/09/18 167 lb 1.7 oz (75.8 kg)    Ideal Body Weight:  50 kg  BMI:  Body mass index is 30.56 kg/m.  Estimated Nutritional Needs:   Kcal:  1700-1900 kcals  Protein:  90-110 g  Fluid:  >/= 1.7 L   Kerman Passey MS, RD, LDN, CNSC [phone] Pager  [phone] Weekend/On-Call Pager

## 2018-07-09 NOTE — Procedures (Signed)
Extubation Procedure Note  Patient Details:   Name: Brandi [name] DOB: 1955/12/25 MRN: [id number]   Airway Documentation:    Vent end date: 07/09/18 Vent end time: 1044   Evaluation  O2 sats: stable throughout Complications: No apparent complications Patient did tolerate procedure well. Bilateral Breath Sounds: Rhonchi, Diminished   No   PT seems to be non verbal  Extubated to a 2L [place] Strong cough Stats are stable  RT to monitor  Brandi [name], Brandi [name] 07/09/2018, 10:46 AM

## 2018-07-09 NOTE — Progress Notes (Addendum)
PULMONARY / CRITICAL CARE MEDICINE   Name: Brandi [name] MRN: [id number] DOB: [birth year]    ADMISSION DATE:  06/23/2018 CONSULTATION DATE: 06/29/2018  REFERRING MD: Dr. Bonner Puna  CHIEF COMPLAINT: Altered mental status  HISTORY OF PRESENT ILLNESS:  62 y/o F who presented to [facility] on 7/9 with abdominal pain, nausea, vomiting, decreased oral intake and weakness.    She was reportedly evaluated by her PCP prior to admit with elevated LFT's (AST 302, ALT 611, AP 380).  Follow up CT of the abdomen demonstrated prominent caudate lobe and surface irregularity of the liver.   In the ER, US of the abdomen confirmed the CT findings of the liver.  She was noted to be lethargic and have asterixis on exam.  Ammonia 22 (7/10) > 38 (7/12).  The patient had AFwRVR and was treated with cardizem.  She was admitted for further evaluation.  The patient was seen by Cardiology and transitioned to metoprolol for rate control due to decreased LVEF (30-35%). Labs showed a microcytic anemia with ferritin of 9.  GI was consulted for evaluation with recommendations for lactulose and eventual endoscopic evaluation.  She had ongoing encephalopathy and a CT of the head was obtained which showed a right frontal operculum CVA.  Neurology evaluated the patient and felt the stroke was likely related to AF but not the cause of her mental status.  She was recommended anticoagulation and passed a swallow evaluation converting from heparin to eliquis.  On 7/15, her confusion, tardive dyskinesia symptoms and lethargy worsened as well as her LFT's (AST 3942, ALT 1976, ALK Phos 404), renal function and WBC.  PCCM consulted for evaluation.     SUBJECTIVE:  RN reports no acute events.  Stool output-2.7L in last 24 hours.  Pt remains 7.8L positive. Weaning on PSV 5/5, Vt 350-400.      VITAL SIGNS: BP (!) 144/84   Pulse 95   Temp 98.8 F (37.1 C) (Oral)   Resp (!) 27   Ht '5\' 2"'  (1.575 m)   Wt 167 lb 1.7 oz (75.8 kg)   SpO2 99%   BMI  30.56 kg/m   HEMODYNAMICS:    VENTILATOR SETTINGS: Vent Mode: CPAP;PSV FiO2 (%):  [30 %] 30 % Set Rate:  [12 bmp] 12 bmp Vt Set:  [350 mL] 350 mL PEEP:  [5 cmH20] 5 cmH20 Pressure Support:  [5 cmH20] 5 cmH20 Plateau Pressure:  [8 cmH20-12 cmH20] 10 cmH20  INTAKE / OUTPUT:  Intake/Output Summary (Last 24 hours) at 07/09/2018 0962 Last data filed at 07/09/2018 0600 Gross per 24 hour  Intake 2430 ml  Output 3710 ml  Net -1280 ml    PHYSICAL EXAMINATION: General: adult female in NAD on vent HEENT: MM pink/moist, ETT, Cortrak Neuro: Awake, alert, attempts to follow commands / generalized weakness, nods yes/no to questions.  Tardive dyskinesia / repetitive tongue movemement.   CV: s1s2 rrr, no m/r/g PULM: even/non-labored, lungs bilaterally coarse EZ:MOQH, non-tender, bsx4 active  Extremities: warm/dry, generalized 1-2+ edema  Skin: no rashes or lesions   LABS:  BMET Recent Labs  Lab 07/08/18 0346 07/08/18 2158 07/09/18 0413  NA 156* 157* 156*  K 2.6* 6.2* 2.9*  CL 123* 129* 126*  CO2 26 19* 25  BUN 32* 31* 28*  CREATININE 0.85 0.72 0.65  GLUCOSE 175* 148* 172*    Electrolytes Recent Labs  Lab 07/03/18 0911  07/07/18 0420 07/08/18 0346 07/08/18 2158 07/09/18 0413  CALCIUM 8.0*   < > 8.2* 8.1* 8.4* 8.1*  MG 1.7  --  1.5* 2.2  --   --   PHOS 3.7  --  2.7  --   --   --    < > = values in this interval not displayed.    CBC Recent Labs  Lab 07/07/18 0420 07/08/18 0346 07/08/18 0920  WBC 13.3* 15.4* 17.3*  HGB 8.8* 8.9* 9.1*  HCT 30.0* 30.9* 31.2*  PLT 108* 110* 127*    Coag's Recent Labs  Lab 07/04/18 0234 07/04/18 0352 07/06/18 0424  APTT  --  22*  --   INR 1.82 1.86 1.65    Sepsis Markers Recent Labs  Lab 07/04/18 0345 07/05/18 1151 07/06/18 0424 07/07/18 0420  LATICACIDVEN 2.0*  --   --   --   PROCALCITON  --  2.03 1.85 1.17    ABG Recent Labs  Lab 07/04/18 0218 07/07/18 0400 07/07/18 1434  PHART 7.531* 7.516* 7.479*   PCO2ART 29.8* 31.4* 31.4*  PO2ART 73.0* 126* 121.0*    Liver Enzymes Recent Labs  Lab 07/06/18 0424 07/07/18 0420 07/09/18 0413  AST 48* 41 56*  ALT 216* 164* 119*  ALKPHOS 220* 220* 199*  BILITOT 4.2* 4.7* 3.5*  ALBUMIN 3.1* 2.8* 2.4*    Cardiac Enzymes No results for input(s): TROPONINI, PROBNP in the last 168 hours.  Glucose Recent Labs  Lab 07/08/18 1204 07/08/18 1600 07/08/18 2014 07/08/18 2344 07/09/18 0320 07/09/18 0747  GLUCAP 157* 156* 152* 127* 131* 166*    Imaging  STUDIES:  [place] ABD 7/6 >> possible cirrhosis, gallbladder surgically absent CT Head 7/9 >> atrophy with small vessel chronic ischemic changes of deep cerebral white matter CT Renal Study 7/9 >> no acute findings, bilateral non-obstructing renal stones ECHO 7/9 >> LV mildly dilated, wall thickness was increased in a pattern of moderate LVH, LVEF 30-35%, severe hypokinesis of the anteroseptal, anterior, inferoseptal & apical myocardium, mild to moderate MV regurgitation, LA severely dilated, RV moderately dilated, RA mod-severely dilated, PA pressure 63mHg, trivial pericardial effusion. CTA Head, Neck 7/12 >> minimal calcified plaque at the carotid bifurcation regions but no stenosis or irregularity, no large vessel occlusions, unable to specifically identify the missing branch vessel responsible for the right posterior frontal infarction but this is presumably an occluded M3 or distal branch CT Head w/o 7/12 >> the small right middle cerebral artery distribution, posterolateral frontal lobe infarct has evolved since the prior CT, it is now seen as a well defined area of hypoattenuation, no new areas of infarct, no ICH CT head 7/15 >> Redemonstration of right posterior frontal lobe infarct. No acute intracranial hemorrhage or new infarct identified.  Atrophy with chronic small vessel ischemia. CT abd/pelvis 7/18 > Diffuse dilation of the colon with large amount of ascending colonic stool.    CULTURES: BCx2 7/10 >> negative  Cdiff 7/20 >> negative  Sputum 7/20 >> negative  ANTIBIOTICS: Vanco 7/15 >> 7/21 Zoysn 7/15 >> 7/22  SIGNIFICANT EVENTS: 7/09  Admit  7/15  PCCM consulted with AMS, acute liver failure 7/19  Had been receiving fluid challenges for hypotension. 7/20   Early in the a.m. hours drop blood pressure.  Placed on pressors.  Non-anion gap acidosis noted.  Cortisol 29.2 CBC remains elevated blood cell count remains elevated.  7/21  no significant fever.  White cell count cut in half.  Chest x-ray looks improved.  We resumed lactulose and rifaximin yesterday, also resumed spironolactone.  Still obtunded  7/22  Free water increased 7/23  800 ml in flexiseal overnight.  Off home PSY meds. Mg/K low.  Na 153 on free water. Continues to have tardive dyskinesia.  7/24 Tmax 100.2.  I/O- positive 8.6L for admit / 2125 of stool out 7/23.  LINES/TUBES: ETT 7/15 >>   DISCUSSION: 62 y/o F admitted with weakness, decreased intake and lethargy.  Work up concerning for NASH, [place].  She developed AMS > small infarct thought not related to AMS.  Recommended for anticoagulation by Cardiology/Neurology, was transitioned from heparin to eliquis.  The patient deteriorated on 7/15 with a rise in LFT's, AKI, confusion/AMS, worsening of tardive dyskinesia type symptoms.  Transferred to ICU.    ICU course significant for ongoing issues with mental status which is a major barrier to extubation.  Continues on lactulose, rifaximin.  ASSESSMENT / PLAN:  Acute Respiratory Insufficiency - in setting of liver failure  Bilateral Pleural Effusions - suspect in setting of liver disease, HF, AKI P: PRVC 8 cc/kg  Wean PEEP / FiO2 for sats >90% Daily SBT  Follow CXR   Acute metabolic & hepatic  encephalopathy/ Acute delirium CVA - Right Frontal Operculum infarct - felt related to AF/embolic but not all cause of mental status Right-sided weakness Seizure disorder Schizophrenia Tardive  Dyskinesia  P: Continue reduced dose Buspar  Hold home effexor  Continue vimpat, keppra, lactulose, rifaximin Limit sedation   Ileus - improving P: TF per Nutrition  PPI for SUP   HFrEF A. fib with RVR HTN Bradycardia - into the 20's 7/23 am -Had transient hypotension the a.m. of 7/20.  Etiology not entirely clear if this required pressors transiently now hypertensive without pressors - mildly elevated troponin's; EKG unchanged  -cardiology following AC held d/t liver dysfxn P: Continue reduced dose lopressor, 25 mg BID  PRN lopressor for HR >120 sustained Spironolactone  Hold anticoagulation with hepatic dysfunction  Would like to diurese but concern with hypernatremia, will discuss with MD  AKI - in setting of liver injury, NASH Hypernatremia / Hyperchloremia  Hypokalemia Volume Overload P: Free water 450 ml/Q4 Trend BMP / urinary output Replace electrolytes as indicated, K 7/25  Avoid nephrotoxic agents, ensure adequate renal perfusion  Karlene Lineman Cirrhosis with Hepatic Encephalopathy  - unclear in what precipitated change in events LFTs/ decompensation 7/15 - 7/15 US liver w/doppler with no evidence of portal venous thrombosis  -LFTs without significant change P: Follow LFT's, INR (improved) Lactulose 30 gm TID, consider reduction soon pending LFT's / ammonia  Anemia  P: Trend CBC  Monitor for bleeding   Leukocytosis, questionable sepsis however etiology unclear.  Question of spontaneous bacterial peritonitis P: Monitor off abx  Coagulopathy, and thrombocytopenia - s/p 3 units FFP 7/16 am P: Follow INR  No anticoagulation with increased bleeding risk / hepatic dysfunction  Hx Breast Cancer P: Follow up as outpatient     DM 2 P: SSI  DVT prophylaxis: SCD's SUP: PPI Diet: NPO Activity: BR Disposition : ICU  FAMILY  - Updates: Husband lives in [place] and typically visits late in the evening after work.  No family available am 7/25.   Brandi Gens, NP-C [place] [facility] Pgr: [phone] or if no answer [phone] 07/09/2018, 9:29 AM

## 2018-07-09 NOTE — Progress Notes (Signed)
[facility] ADULT ICU REPLACEMENT PROTOCOL FOR AM LAB REPLACEMENT ONLY  The patient does apply for the [facility] Adult ICU Electrolyte Replacment Protocol based on the criteria listed below:   1. Is GFR >/= 40 ml/min? Yes.    Patient's GFR today is >60 2. Is urine output >/= 0.5 ml/kg/hr for the last 6 hours? Yes.   Patient's UOP is 0.72 ml/kg/hr 3. Is BUN < 60 mg/dL? Yes.    Patient's BUN today is 28 4. Abnormal electrolyte(s):Potassium 2.9 5. Ordered repletion with: Potassium per protocol 6. If a panic level lab has been reported, has the CCM MD in charge been notified? Yes.  .   Physician:  Colleen Can, Trenden Hazelrigg P 07/09/2018 6:15 AM

## 2018-07-10 ENCOUNTER — Inpatient Hospital Stay: Payer: Self-pay

## 2018-07-10 ENCOUNTER — Inpatient Hospital Stay (HOSPITAL_COMMUNITY): Payer: Medicare Other

## 2018-07-10 LAB — CBC
HCT: 33 % — ABNORMAL LOW (ref 36.0–46.0)
Hemoglobin: 9.2 g/dL — ABNORMAL LOW (ref 12.0–15.0)
MCH: 21.2 pg — AB (ref 26.0–34.0)
MCHC: 27.9 g/dL — AB (ref 30.0–36.0)
MCV: 76 fL — ABNORMAL LOW (ref 78.0–100.0)
Platelets: UNDETERMINED 10*3/uL (ref 150–400)
RBC: 4.34 MIL/uL (ref 3.87–5.11)
RDW: 28.3 % — ABNORMAL HIGH (ref 11.5–15.5)
WBC: 13.4 10*3/uL — ABNORMAL HIGH (ref 4.0–10.5)

## 2018-07-10 LAB — GLUCOSE, CAPILLARY
GLUCOSE-CAPILLARY: 132 mg/dL — AB (ref 70–99)
GLUCOSE-CAPILLARY: 167 mg/dL — AB (ref 70–99)
Glucose-Capillary: 171 mg/dL — ABNORMAL HIGH (ref 70–99)
Glucose-Capillary: 187 mg/dL — ABNORMAL HIGH (ref 70–99)
Glucose-Capillary: 130 mg/dL — ABNORMAL HIGH (ref 70–99)
Glucose-Capillary: 166 mg/dL — ABNORMAL HIGH (ref 70–99)

## 2018-07-10 LAB — OSMOLALITY: OSMOLALITY: 328 mosm/kg — AB (ref 275–295)

## 2018-07-10 LAB — BASIC METABOLIC PANEL
Anion gap: 6 (ref 5–15)
BUN: 26 mg/dL — AB (ref 8–23)
CALCIUM: 8.2 mg/dL — AB (ref 8.9–10.3)
CO2: 22 mmol/L (ref 22–32)
CREATININE: 0.65 mg/dL (ref 0.44–1.00)
Chloride: 128 mmol/L — ABNORMAL HIGH (ref 98–111)
Glucose, Bld: 158 mg/dL — ABNORMAL HIGH (ref 70–99)
POTASSIUM: 3 mmol/L — AB (ref 3.5–5.1)
SODIUM: 156 mmol/L — AB (ref 135–145)

## 2018-07-10 LAB — MAGNESIUM: MAGNESIUM: 1.8 mg/dL (ref 1.7–2.4)

## 2018-07-10 MED ORDER — CHLORHEXIDINE GLUCONATE CLOTH 2 % EX PADS
6.0000 | MEDICATED_PAD | Freq: Every day | CUTANEOUS | Status: DC
  Administered 2018-07-10 – 2018-07-23 (×14): 6 via TOPICAL

## 2018-07-10 MED ORDER — SODIUM CHLORIDE 0.9% FLUSH: 10.0000 mL | INTRAVENOUS | Status: DC | PRN

## 2018-07-10 MED ORDER — DEXMEDETOMIDINE HCL IN NACL 400 MCG/100ML IV SOLN
0.2000 ug/kg/h | INTRAVENOUS | Status: DC
  Administered 2018-07-10: 0.2 ug/kg/h via INTRAVENOUS
  Administered 2018-07-11: 0.6 ug/kg/h via INTRAVENOUS
  Administered 2018-07-11: 0.7 ug/kg/h via INTRAVENOUS
  Administered 2018-07-11: 0.6 ug/kg/h via INTRAVENOUS
  Filled 2018-07-10 (×3): qty 100

## 2018-07-10 MED ORDER — SODIUM CHLORIDE 0.9% FLUSH
10.0000 mL | Freq: Two times a day (BID) | INTRAVENOUS | Status: DC
  Administered 2018-07-10 – 2018-07-23 (×17): 10 mL

## 2018-07-10 MED ORDER — DEXTROSE 5 % IV SOLN
INTRAVENOUS | Status: DC
  Administered 2018-07-10 – 2018-07-12 (×5): via INTRAVENOUS

## 2018-07-10 NOTE — Progress Notes (Signed)
eLink Physician-Brief Progress Note Patient Name: Brandi [name] DOB: [birth year] MRN: [id number]   Date of Service  07/10/2018  HPI/Events of Note  Agitation potentially impacting safety  eICU Interventions  Precedex infusion for mild sedation        Frederik Pear 07/10/2018, 11:15 PM

## 2018-07-10 NOTE — Evaluation (Signed)
Physical Therapy Evaluation Patient Details Name: Brandi [name] MRN: [id number] DOB: 13-Aug-1956 Today's Date: 07/10/2018   History of Present Illness  This 62 year old female was admitted for nausea and palpitations.  Admitted with AFib with RVR/acute encephalopathy. On 7/15,  pt's condition worsened with increased confusion/lethargy, tardive dyskinesia symptoms, AKI, shock liver and respiratory failure requiring intubation.  Extubated 7/25.   PMH: seizures, depression, schizophrenia, h/o breast CA and HTN  Clinical Impression  Pt admitted with above diagnosis. Pt currently with functional limitations due to the deficits listed below (see PT Problem List). PT limited in her participation by decreased command following, delayed sequencing, decreased strength, decreased balance, and decreased coordination. Family not present during evaluation. Unable to assess PLOF and Home set up as pt with agreement to most yes/no questioning.  Pt requires total Ax2 to come to EoB. Able to sit EoB for 5 minutes and follow commands inconsistently with max-minA for maintaining balance.   Pt will benefit from skilled PT to increase their independence and safety with mobility to allow discharge to the venue listed below.       Follow Up Recommendations SNF    Equipment Recommendations  None recommended by PT    Recommendations for Other Services OT consult     Precautions / Restrictions Precautions Precautions: Fall Required Braces or Orthoses: (flexi seal, cortrak, PICC) Restrictions Weight Bearing Restrictions: No      Mobility  Bed Mobility Overal bed mobility: Needs Assistance Bed Mobility: Sidelying to Sit;Supine to Sit   Sidelying to sit: Total assist;+2 for physical assistance Supine to sit: Total assist;+2 for physical assistance     General bed mobility comments: total A for pad helicopter transfer to keep [place] in place, in coming to EoB and returning to supine.                          Balance Overall balance assessment: Needs assistance Sitting-balance support: Feet supported;Bilateral upper extremity supported Sitting balance-Leahy Scale: Zero Sitting balance - Comments: requires min-max assist to maintain balance for 5 minutes Postural control: Right lateral lean                                   Pertinent Vitals/Pain Pain Assessment: Faces Faces Pain Scale: Hurts little more Pain Location: generalized with sitting EoB Pain Descriptors / Indicators: Grimacing Pain Intervention(s): Limited activity within patient's tolerance;Monitored during session;Repositioned    Home Living Family/patient expects to be discharged to:: Private residence Living Arrangements: Spouse/significant other               Additional Comments: (unable to get an idea of home setup)    Prior Function           Comments: (unsure)        Extremity/Trunk Assessment   Upper Extremity Assessment Upper Extremity Assessment: RUE deficits/detail;LUE deficits/detail;Difficult to assess due to impaired cognition RUE Deficits / Details: moves UE spontaneously, decreased PROM of shoulders, and elbows, strength grossly assessed from movement at 2+/5 RUE Coordination: decreased fine motor;decreased gross motor LUE Deficits / Details: Less spontaneous movement, PROM limited, strength grossly assessed from movement 2+/5 LUE Coordination: decreased fine motor;decreased gross motor    Lower Extremity Assessment Lower Extremity Assessment: Difficult to assess due to impaired cognition;LLE deficits/detail;RLE deficits/detail RLE Deficits / Details: moves spontaneously, near full ROM, strength grossly assessed at 2+/5 RLE Coordination: decreased fine motor;decreased  gross motor LLE Deficits / Details: moves spontanesously, near full ROM , strength grossly 2+/5    Cervical / Trunk Assessment Cervical / Trunk Assessment: Other exceptions Cervical / Trunk  Exceptions: difficulty coming to midline in sitting R lateral lean   Communication   Communication: Expressive difficulties(tardive dyskinesia )  Cognition Arousal/Alertness: Awake/alert Behavior During Therapy: Restless;Flat affect;Impulsive Overall Cognitive Status: History of cognitive impairments - at baseline                                 General Comments: responds to her name and delayed processing of other commands which she follows inconsistently      General Comments General comments (skin integrity, edema, etc.): VSS, pt with constant movement when supine in bed, bridging up and moving LE         Assessment/Plan    PT Assessment Patient needs continued PT services  PT Problem List Decreased strength;Decreased range of motion;Decreased activity tolerance;Decreased balance;Decreased mobility;Decreased coordination;Decreased cognition;Decreased safety awareness;Decreased knowledge of use of DME       PT Treatment Interventions DME instruction;Gait training;Functional mobility training;Therapeutic activities;Therapeutic exercise;Balance training;Cognitive remediation;Patient/family education    PT Goals (Current goals can be found in the Care Plan section)  Acute Rehab PT Goals Patient Stated Goal: none stated PT Goal Formulation: Patient unable to participate in goal setting Time For Goal Achievement: 12-Aug-2018 Potential to Achieve Goals: Poor    Frequency Min 2X/week    AM-PAC PT "6 Clicks" Daily Activity  Outcome Measure Difficulty turning over in bed (including adjusting bedclothes, sheets and blankets)?: Unable Difficulty moving from lying on back to sitting on the side of the bed? : Unable Difficulty sitting down on and standing up from a chair with arms (e.g., wheelchair, bedside commode, etc,.)?: Unable Help needed moving to and from a bed to chair (including a wheelchair)?: Total Help needed walking in hospital room?: Total Help needed climbing  3-5 steps with a railing? : Total 6 Click Score: 6    End of Session Equipment Utilized During Treatment: Oxygen Activity Tolerance: Treatment limited secondary to medical complications (Comment) Patient left: in bed;with call bell/phone within reach;with bed alarm set;with nursing/sitter in room Nurse Communication: Mobility status PT Visit Diagnosis: Unsteadiness on feet (R26.81);Other abnormalities of gait and mobility (R26.89);Muscle weakness (generalized) (M62.81);Difficulty in walking, not elsewhere classified (R26.2);Other symptoms and signs involving the nervous system (R29.898)    Time: 6962-9528 PT Time Calculation (min) (ACUTE ONLY): 20 min   Charges:   PT Evaluation $PT Eval High Complexity: 1 High          Malaya Cagley B. Migdalia Dk PT, DPT Acute Rehabilitation  [phone] Pager [phone]    [place] 07/10/2018, 5:00 PM

## 2018-07-10 NOTE — Progress Notes (Signed)
Patient was assessed by two IV nurses for PIV placement in right arm - left arm restricted.  Patient is swollen with limited access.  Primary RN, Saralyn Pilar, was notified and MD will be consulted for alternate/central line placement.

## 2018-07-10 NOTE — Progress Notes (Signed)
Peripherally Inserted Central Catheter/Midline Placement  The IV Nurse has discussed with the patient and/or persons authorized to consent for the patient, the purpose of this procedure and the potential benefits and risks involved with this procedure.  The benefits include less needle sticks, lab draws from the catheter, and the patient may be discharged home with the catheter. Risks include, but not limited to, infection, bleeding, blood clot (thrombus formation), and puncture of an artery; nerve damage and irregular heartbeat and possibility to perform a PICC exchange if needed/ordered by physician.  Alternatives to this procedure were also discussed.  Bard Power PICC patient education guide, fact sheet on infection prevention and patient information card has been provided to patient /or left at bedside.    PICC/Midline Placement Documentation  PICC Double Lumen 07/10/18 PICC Right Brachial 34 cm 1 cm (Active)  Indication for Insertion or Continuance of Line Poor Vasculature-patient has had multiple peripheral attempts or PIVs lasting less than 24 hours 07/10/2018 12:41 PM  Exposed Catheter (cm) 1 cm 07/10/2018 12:41 PM  Site Assessment Clean;Dry;Intact 07/10/2018 12:41 PM  Lumen #1 Status Flushed;Blood return noted 07/10/2018 12:41 PM  Lumen #2 Status Flushed;Blood return noted 07/10/2018 12:41 PM  Dressing Type Transparent 07/10/2018 12:41 PM  Dressing Status Clean;Dry;Intact;Antimicrobial disc in place 07/10/2018 12:41 PM  Dressing Intervention New dressing 07/10/2018 12:41 PM  Dressing Change Due 07/17/18 07/10/2018 12:41 PM   Telephone consent signed by Royanne Foots 07/10/2018, 12:42 PM

## 2018-07-10 NOTE — Progress Notes (Signed)
CSW continuing to follow and assist with disposition when stable  Jorge Ny, [place] Social Worker [phone]

## 2018-07-10 NOTE — Progress Notes (Signed)
PULMONARY / CRITICAL CARE MEDICINE   Name: Brandi [name] MRN: [id number] DOB: 1956-12-02    ADMISSION DATE:  06/23/2018 CONSULTATION DATE: 06/29/2018  REFERRING MD: Dr. Bonner Puna  CHIEF COMPLAINT: Altered mental status  HISTORY OF PRESENT ILLNESS:  62 y/o F who presented to [facility] on 7/9 with abdominal pain, nausea, vomiting, decreased oral intake and weakness.    She was reportedly evaluated by her PCP prior to admit with elevated LFT's (AST 302, ALT 611, AP 380).  Follow up CT of the abdomen demonstrated prominent caudate lobe and surface irregularity of the liver.   In the ER, US of the abdomen confirmed the CT findings of the liver.  She was noted to be lethargic and have asterixis on exam.  Ammonia 22 (7/10) > 38 (7/12).  The patient had AFwRVR and was treated with cardizem.  She was admitted for further evaluation.  The patient was seen by Cardiology and transitioned to metoprolol for rate control due to decreased LVEF (30-35%). Labs showed a microcytic anemia with ferritin of 9.  GI was consulted for evaluation with recommendations for lactulose and eventual endoscopic evaluation.  She had ongoing encephalopathy and a CT of the head was obtained which showed a right frontal operculum CVA.  Neurology evaluated the patient and felt the stroke was likely related to AF but not the cause of her mental status.  She was recommended anticoagulation and passed a swallow evaluation converting from heparin to eliquis.  On 7/15, her confusion, tardive dyskinesia symptoms and lethargy worsened as well as her LFT's (AST 3942, ALT 1976, ALK Phos 404), renal function and WBC.  PCCM consulted for evaluation.     SUBJECTIVE/INTEVAL  7/26: extubated on 7/25. Remains encephalopathic. NA still elevated     VITAL SIGNS: Blood Pressure 135/73 (BP Location: Right Arm)   Pulse (Abnormal) 114   Temperature 98.6 F (37 C) (Oral)   Respiration (Abnormal) 30   Height '5\' 2"'  (1.575 m)   Weight 165 lb 12.6 oz (75.2  kg)   Oxygen Saturation 97%   Body Mass Index 30.32 kg/m    HEMODYNAMICS:    VENTILATOR SETTINGS:    INTAKE / OUTPUT:  Intake/Output Summary (Last 24 hours) at 07/10/2018 1093 Last data filed at 07/10/2018 0700 Gross per 24 hour  Intake 2409.5 ml  Output 2086 ml  Net 323.5 ml    PHYSICAL EXAMINATION:  General: This is a chronically ill-appearing 62 year old female she is still quite disheveled and confused.  She is unable to phonate  Words  HEENT normocephalic atraumatic she has a Dobbhoff tube in place no jugular venous distention mucous membranes moist Pulmonary: Some scattered rhonchi no accessory use Cardiac: Regular rate and rhythm is having occasional PVCs on telemetry Abdomen: Soft nontender no organomegaly Extremities: Dependent edema warm dry GU: Clear yellow Neuro: Awake, speech garbled at best.  Moves extremities, follows commands.  LABS:  BMET Recent Labs  Lab 07/08/18 2158 07/09/18 0413 07/10/18 0418  NA 157* 156* 156*  K 6.2* 2.9* 3.0*  CL 129* 126* 128*  CO2 19* 25 22  BUN 31* 28* 26*  CREATININE 0.72 0.65 0.65  GLUCOSE 148* 172* 158*    Electrolytes Recent Labs  Lab 07/07/18 0420 07/08/18 0346 07/08/18 2158 07/09/18 0413 07/10/18 0418  CALCIUM 8.2* 8.1* 8.4* 8.1* 8.2*  MG 1.5* 2.2  --   --  1.8  PHOS 2.7  --   --   --   --     CBC Recent Labs  Lab 07/08/18 0346 07/08/18 0920 07/10/18 0418  WBC 15.4* 17.3* 13.4*  HGB 8.9* 9.1* 9.2*  HCT 30.9* 31.2* 33.0*  PLT 110* 127* PLATELET CLUMPS NOTED ON SMEAR, UNABLE TO ESTIMATE    Coag's Recent Labs  Lab 07/04/18 0234 07/04/18 0352 07/06/18 0424  APTT  --  22*  --   INR 1.82 1.86 1.65    Sepsis Markers Recent Labs  Lab 07/04/18 0345 07/05/18 1151 07/06/18 0424 07/07/18 0420  LATICACIDVEN 2.0*  --   --   --   PROCALCITON  --  2.03 1.85 1.17    ABG Recent Labs  Lab 07/04/18 0218 07/07/18 0400 07/07/18 1434  PHART 7.531* 7.516* 7.479*  PCO2ART 29.8* 31.4* 31.4*   PO2ART 73.0* 126* 121.0*    Liver Enzymes Recent Labs  Lab 07/06/18 0424 07/07/18 0420 07/09/18 0413  AST 48* 41 56*  ALT 216* 164* 119*  ALKPHOS 220* 220* 199*  BILITOT 4.2* 4.7* 3.5*  ALBUMIN 3.1* 2.8* 2.4*    Cardiac Enzymes No results for input(s): TROPONINI, PROBNP in the last 168 hours.  Glucose Recent Labs  Lab 07/09/18 1145 07/09/18 1527 07/09/18 1931 07/10/18 0005 07/10/18 0407 07/10/18 0753  GLUCAP 88 96 148* 187* 171* 132*    Imaging  STUDIES:  [place] ABD 7/6 >> possible cirrhosis, gallbladder surgically absent CT Head 7/9 >> atrophy with small vessel chronic ischemic changes of deep cerebral white matter CT Renal Study 7/9 >> no acute findings, bilateral non-obstructing renal stones ECHO 7/9 >> LV mildly dilated, wall thickness was increased in a pattern of moderate LVH, LVEF 30-35%, severe hypokinesis of the anteroseptal, anterior, inferoseptal & apical myocardium, mild to moderate MV regurgitation, LA severely dilated, RV moderately dilated, RA mod-severely dilated, PA pressure 55mHg, trivial pericardial effusion. CTA Head, Neck 7/12 >> minimal calcified plaque at the carotid bifurcation regions but no stenosis or irregularity, no large vessel occlusions, unable to specifically identify the missing branch vessel responsible for the right posterior frontal infarction but this is presumably an occluded M3 or distal branch CT Head w/o 7/12 >> the small right middle cerebral artery distribution, posterolateral frontal lobe infarct has evolved since the prior CT, it is now seen as a well defined area of hypoattenuation, no new areas of infarct, no ICH CT head 7/15 >> Redemonstration of right posterior frontal lobe infarct. No acute intracranial hemorrhage or new infarct identified.  Atrophy with chronic small vessel ischemia. CT abd/pelvis 7/18 > Diffuse dilation of the colon with large amount of ascending colonic stool.   CULTURES: BCx2 7/10 >> negative  Cdiff  7/20 >> negative  Sputum 7/20 >> negative  ANTIBIOTICS: Vanco 7/15 >> 7/21 Zoysn 7/15 >> 7/22  SIGNIFICANT EVENTS: 7/09  Admit  7/15  PCCM consulted with AMS, acute liver failure 7/19  Had been receiving fluid challenges for hypotension. 7/20   Early in the a.m. hours drop blood pressure.  Placed on pressors.  Non-anion gap acidosis noted.  Cortisol 29.2 CBC remains elevated blood cell count remains elevated.  7/21  no significant fever.  White cell count cut in half.  Chest x-ray looks improved.  We resumed lactulose and rifaximin yesterday, also resumed spironolactone.  Still obtunded  7/22  Free water increased 7/23  800 ml in flexiseal overnight.  Off home PSY meds. Mg/K low.  Na 153 on free water. Continues to have tardive dyskinesia.  7/24 Tmax 100.2.  I/O- positive 8.6L for admit / 2125 of stool out 7/23.  LINES/TUBES: ETT 7/15 >> 7/25  DISCUSSION: 62 y/o F admitted with weakness, decreased intake and lethargy.  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  She developed AMS > small infarct thought not related to AMS.  Recommended for anticoagulation by Cardiology/Neurology, was transitioned from heparin to eliquis.  The patient deteriorated on 7/15 with a rise in LFT's, AKI, confusion/AMS, worsening of tardive dyskinesia type symptoms.  Transferred to ICU.    ICU course significant for ongoing issues with mental status which is a major barrier to extubation.  Continues on lactulose, rifaximin.  At this point she has made slow progress.  She is now extubated, on room air, and not appear to be in any distress.  She remains severely encephalopathic, at this point its likely more metabolic than hepatic given her significant water imbalance.  She no longer needs ICU level medical care however does need close nursing monitoring.  I am concerned about her cough mechanics and ability to adequately protect her airway.  I am hopeful as we continue to correct her hypernatremia this will  continue to improve.  For today the plan will be to continue aggressive free water replacement, I am adding D5 water at 50 mL an hour in addition to her current rate of tube replacement as she has had no change over the last 24 hours.  We are discontinuing her central venous line.  We will mobilize her today, getting her out of bed.  We will continue tube feeds.  And continue rehab focused measures.  We will leave her in the intensive care bed with stepdown level monitoring.  She is not at a point where I think she is safe to move to the medical wards given her poor neurological status and concern about airway clearance.  I think she needs more intensive nursing care.  ASSESSMENT / PLAN: Resolved issues: AKI Acute respiratory failure Ileus Volume overload  Bradycardia - into the 20's 7/23 am   Active issues  Acute Respiratory Insufficiency - in setting of liver failure  Bilateral Pleural Effusions - suspect in setting of liver disease, HF, AKI Plan N.p.o. Pulse oximetry Wean oxygen Dewain Penning  Acute metabolic & hepatic  encephalopathy/ Acute delirium CVA - Right Frontal Operculum infarct - felt related to AF/embolic but not all cause of mental status Right-sided weakness Seizure disorder Schizophrenia Tardive Dyskinesia  Plan 10 you reduce dose BuSpar Holding Effexor Continuing Vimpat, Keppra, lactulose and rifaximin   HFrEF A. fib with RVR HTN -Had transient hypotension the a.m. of 7/20.  Etiology not entirely clear if this required pressors transiently now hypertensive without pressors - mildly elevated troponin's; EKG unchanged  -cardiology following AC held d/t liver dysfxn Plan Continuing with reduced dose Lopressor 25 mg via tube twice daily PRN Lopressor for heart rate greater than 120 sustained No anticoagulation given hepatic dysfunction Holding diuresis for now given water imbalance.  This will need to be reassessed daily we did have her on both spironolactone and Lasix    Fluid and Electrolyte imbalance: Hypernatremia / Hyperchloremia, Hypokalemia  Plan Cont free water at 451m q 4 Add D5W @ 50 ml/hr  Replace potassium Repeat am chemistry  Hold diuretics for now; re-assess daily re: timing of resuming   NKarlene LinemanCirrhosis with Hepatic Encephalopathy  - unclear in what precipitated change in events LFTs/ decompensation 7/15 - 7/15 UKorealiver w/doppler with no evidence of portal venous thrombosis  -LFTs without significant change Plan F/u  Intermittent LFTs Cont lactulose tid   Anemia  Plan Trend CBC  Leukocytosis,  questionable sepsis however etiology unclear.  Question of spontaneous bacterial peritonitis -Antibiotics were discontinued on 7/21.  No fever spikes.  White blood cell count continues to trend down Plan Continue to trend CBC and fever care  Coagulopathy, and thrombocytopenia - s/p 3 units FFP 7/16 am -Most recent INR was on 7/22: 1.65 - Plan Holding anticoagulation given risk of bleeding and hepatic dysfunction     DM 2 Glycemic control acceptable Plan Continue sliding scale insulin   Hx Breast Cancer Plan Follow-up as outpatient  DVT prophylaxis: SCD's SUP: PPI Diet: NPO Activity: BR Disposition : ICU  FAMILY  - Updates: Husband lives in [place] and typically visits late in the evening after work.  No family available am 7/25.     Erick Colace ACNP-BC [place] Pager # [phone] OR # [phone] if no answer

## 2018-07-11 ENCOUNTER — Inpatient Hospital Stay (HOSPITAL_COMMUNITY): Payer: Medicare Other

## 2018-07-11 DIAGNOSIS — I480 Paroxysmal atrial fibrillation: Secondary | ICD-10-CM

## 2018-07-11 DIAGNOSIS — F259 Schizoaffective disorder, unspecified: Secondary | ICD-10-CM

## 2018-07-11 DIAGNOSIS — E876 Hypokalemia: Secondary | ICD-10-CM

## 2018-07-11 LAB — CBC
HEMATOCRIT: 26.6 % — AB (ref 36.0–46.0)
HEMOGLOBIN: 7.7 g/dL — AB (ref 12.0–15.0)
MCH: 22.1 pg — AB (ref 26.0–34.0)
MCHC: 28.9 g/dL — AB (ref 30.0–36.0)
MCV: 76.2 fL — AB (ref 78.0–100.0)
Platelets: 88 10*3/uL — ABNORMAL LOW (ref 150–400)
RBC: 3.49 MIL/uL — ABNORMAL LOW (ref 3.87–5.11)
RDW: 28.2 % — ABNORMAL HIGH (ref 11.5–15.5)
WBC: 10.3 10*3/uL (ref 4.0–10.5)

## 2018-07-11 LAB — BASIC METABOLIC PANEL
ANION GAP: 5 (ref 5–15)
BUN: 22 mg/dL (ref 8–23)
CALCIUM: 7.6 mg/dL — AB (ref 8.9–10.3)
CHLORIDE: 121 mmol/L — AB (ref 98–111)
CO2: 21 mmol/L — AB (ref 22–32)
Creatinine, Ser: 0.67 mg/dL (ref 0.44–1.00)
GFR calc Af Amer: >60 mL/min (ref >60)
GFR calc non Af Amer: >60 mL/min (ref >60)
GLUCOSE: 201 mg/dL — AB (ref 70–99)
POTASSIUM: 3 mmol/L — AB (ref 3.5–5.1)
Sodium: 147 mmol/L — ABNORMAL HIGH (ref 135–145)

## 2018-07-11 LAB — GLUCOSE, CAPILLARY
GLUCOSE-CAPILLARY: 142 mg/dL — AB (ref 70–99)
GLUCOSE-CAPILLARY: 90 mg/dL (ref 70–99)
Glucose-Capillary: 170 mg/dL — ABNORMAL HIGH (ref 70–99)
Glucose-Capillary: 184 mg/dL — ABNORMAL HIGH (ref 70–99)
Glucose-Capillary: 90 mg/dL (ref 70–99)
Glucose-Capillary: 122 mg/dL — ABNORMAL HIGH (ref 70–99)

## 2018-07-11 LAB — MAGNESIUM: MAGNESIUM: 1.5 mg/dL — AB (ref 1.7–2.4)

## 2018-07-11 LAB — AMMONIA: AMMONIA: 33 umol/L (ref 9–35)

## 2018-07-11 MED ORDER — ARIPIPRAZOLE 2 MG PO TABS
2.0000 mg | ORAL_TABLET | Freq: Every day | ORAL | Status: DC
  Administered 2018-07-11 – 2018-07-12 (×2): 2 mg via ORAL
  Filled 2018-07-11 (×2): qty 1

## 2018-07-11 MED ORDER — SODIUM CHLORIDE 0.45 % IV BOLUS
500.0000 mL | Freq: Once | INTRAVENOUS | Status: AC
  Administered 2018-07-11: 500 mL via INTRAVENOUS

## 2018-07-11 MED ORDER — POTASSIUM CHLORIDE 20 MEQ PO PACK
40.0000 meq | PACK | Freq: Once | ORAL | Status: AC
  Administered 2018-07-11: 40 meq via ORAL
  Filled 2018-07-11: qty 2

## 2018-07-11 MED ORDER — POTASSIUM CHLORIDE 10 MEQ/100ML IV SOLN
10.0000 meq | INTRAVENOUS | Status: AC
  Administered 2018-07-11 (×2): 10 meq via INTRAVENOUS
  Filled 2018-07-11 (×2): qty 100

## 2018-07-11 MED ORDER — BUSPIRONE HCL 5 MG PO TABS
10.0000 mg | ORAL_TABLET | Freq: Two times a day (BID) | ORAL | Status: DC
  Administered 2018-07-11: 10 mg via ORAL
  Filled 2018-07-11: qty 2

## 2018-07-11 MED ORDER — MAGNESIUM SULFATE 50 % IJ SOLN
3.0000 g | Freq: Once | INTRAVENOUS | Status: AC
  Administered 2018-07-11: 3 g via INTRAVENOUS
  Filled 2018-07-11: qty 6

## 2018-07-11 MED ORDER — SODIUM CHLORIDE 0.9 % IV BOLUS
500.0000 mL | Freq: Once | INTRAVENOUS | Status: AC
  Administered 2018-07-11: 500 mL via INTRAVENOUS

## 2018-07-11 NOTE — Progress Notes (Signed)
Pt with increased agitation, attempting to get OOB, RR increased. Attempts to reorient pt unsuccessful and patient repeatedly trying to get OOB.  Precedex gtt started per MD order.  Will continue to monitor pt.

## 2018-07-11 NOTE — Progress Notes (Signed)
PROGRESS NOTE    ALAJIA SCHMELZER  TMH:[id number] DOB: [birth year] DOA: 06/23/2018 PCP: Denita Lung, MD    Brief Narrative:  62 y/o F who presented to [facility] on 7/9 with abdominal pain, nausea, vomiting, decreased oral intake and weakness.She was found to be in  A fib with RVR, cardiology consulted and was on IV cardizem and transitioned to oral lopressor.  She developed  altered mental status  possibly  From both metabolic and hepatic encephalopathy and from new right frontal operculum infarct, had to be intubated and transferred to ICU. She was extubated on 7/25 , but had to be on precedex gtt briefly for acute delirium on 7/26.   She was transferred to [facility] service on 7/27  Assessment & Plan:   Principal Problem:   Atrial fibrillation with RVR (HCC) Active Problems:   Major depression   Migraine headache   GERD (gastroesophageal reflux disease)   History of breast cancer in female   Hyperlipidemia associated with type 2 diabetes mellitus (HCC)   Leukocytosis   Type 2 diabetes mellitus without complication, without long-term current use of insulin ([place])   Hypertension associated with diabetes ([place])   Anemia   Elevated LFTs   Malaise   Schizophrenia ([place])   History of seizures   Nausea and vomiting   Cerebral embolism with cerebral infarction   Paroxysmal atrial fibrillation (HCC)   Acute respiratory failure with hypoxemia (HCC)   Shock circulatory (HCC)   Acute encephalopathy   Liver failure ([place])   Acute respiratory failure secondary to AMS from metabolic, and hepatic encephalopathy: S/p intubation on 7/15 and extubated on 7/25.     Acute encephalopathy   Metabolic encephalopathy from hypernatremia.  On dextrose fluids, with improvement in serum sodium level.  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  Check ammonia level today.   Right Frontal Operculum Infarct/ MCA infarct  with some residual right sided weakness Possibly from Atrial fibrillation/ embolic.  Consider anti coagulation once her liver issues stabilize.   Tardive Dyskinesia., h/o seizures:  On Vimpat and keppra.   Cardiomyopathy/ Acute systolic and diastolic heart failure: Improved. Off Diuretics as she is hypernatremic and she appears to be compensated.  Echo from 06/23/18 showed LVEF OF 30% to 35%.    Paroxysmal Atrial fibrillation;  Rate control with BB/ lopressor.  Holding anti coagulation given liver dysfunction and bleeding risk.  Please follow up with cardiology to see if anti coagulation need to be started prior to discharge if her liver issues improve.    Leukocytosis:  No signs of infection. Off antibiotics at this time.    Coagulopathy and Thrombocytopenia secondary to NASH cirrhosis  S/p 3 units of FFP on 7/16. No signs of bleeding.   AKI on admission:  Resolved with hydration.   Type 2 diabetes mellitis: CBG (last 3)  Recent Labs    07/11/18 0358 07/11/18 0732 07/11/18 1134  GLUCAP 170* 184* 142*   Resume SSI.  Her last A1c is 6.6.   Hypernatremia: free water losses. On dextrose fluids and free water boluses.  Improving. Repeat CMP in am.    Hypokalemia: replaced via both IV and oral.  Repeat in am.    Schizophrenia:  Requested psychiatry consult for medications.    GERD: on PPI.    Hyperlipidemia:  Stable.   Anemia of chronic disease/  anemia of iron deficiency:  Hemoglobin at 7.7 today.  Transfuse to keep  hemoglobin greater than 7.  Start oral iron supplementation when able.    Nutrition:  Currently on tube feeds. SLP evaluation.     DVT prophylaxis:  scd's Code Status:  FULL CODE Family Communication:  Family at bedside.  Disposition Plan: pending clinical improvement.    Consultants:  [place]  Gastroenterology Dr Benson [place] Dr Collene Mares signed off Neurology Dr Aroor signed off  Surgery: signed off.  Nephrology Dr Justin Mend signed off.     CULTURES: BCx2 7/10 >> negative  Cdiff 7/20 >> negative  Sputum 7/20 >> negative   SIGNIFICANT EVENTS: 7/09  Admit  7/15  PCCM consulted with AMS, acute liver failure 7/19  Had been receiving fluid challenges for hypotension. 7/20   hypotension on pressors.  Non-anion gap acidosis noted. 7/22  Free water increased 7/23  800 ml in flexiseal overnight.  . Continues to have tardive dyskinesia.  7/26 required Precedex drip for agitation.  7/ 27 transfer to [place] .   LINES/TUBES: ETT 7/15 >> 7/25 Rectal tube  Foley catheter  Panda tube for tube feeds.  [place] oxygen at 4 lit/min.    Antimicrobials:  Rifaximin.  Vanco 7/15 >> 7/21 Zoysn 7/15 >> 7/22   Subjective: Answering simple questions. No chest pain , has nausea, some belly pain.    Objective: Vitals:   07/11/18 1000 07/11/18 1100 07/11/18 1137 07/11/18 1200  BP: (!) 66/41 (!) 81/53  99/63  Pulse: (!) 50 71  71  Resp: 19 (!) 23  20  Temp:   (!) 97.5 F (36.4 C)   TempSrc:   Axillary   SpO2: 98% 100%  99%  Weight:      Height:        Intake/Output Summary (Last 24 hours) at 07/11/2018 1309 Last data filed at 07/11/2018 1200 Gross per 24 hour  Intake 5944.28 ml  Output 1740 ml  Net 4204.28 ml   Filed Weights   07/09/18 0411 07/10/18 0400 07/11/18 0500  Weight: 75.8 kg (167 lb 1.7 oz) 75.2 kg (165 lb 12.6 oz) 78 kg (171 lb 15.3 oz)    Examination:  General exam: Appears  Calm not in distress on 4 lit of  oxygen.  Respiratory system: diminished at bases, no wheezing heard.  Cardiovascular system: S1 & S2 heard, RRR. No JVD,  Gastrointestinal system: Abdomen is soft , distended, mild tenderness generalized. Bowel sounds good.  Central nervous system: Alert and able to answer simple questions Extremities: 2+ leg edema.  Skin: No rashes, lesions or ulcers Psychiatry: not agitated. Calm.     Data Reviewed: I have personally reviewed following labs and imaging studies  CBC: Recent Labs  Lab  07/05/18 0333 07/06/18 0424 07/07/18 0420 07/08/18 0346 07/08/18 0920 07/10/18 0418 07/11/18 0533  WBC 14.0* 8.0 13.3* 15.4* 17.3* 13.4* 10.3  NEUTROABS 10.7* 6.0 11.1*  --   --   --   --   HGB 7.7* 6.9* 8.8* 8.9* 9.1* 9.2* 7.7*  HCT 26.8* 24.0* 30.0* 30.9* 31.2* 33.0* 26.6*  MCV 64.9* 66.3* 70.6* 72.9* 72.7* 76.0* 76.2*  PLT 105* 100* 108* 110* 127* PLATELET CLUMPS NOTED ON SMEAR, UNABLE TO ESTIMATE 88*   Basic Metabolic Panel: Recent Labs  Lab 07/07/18 0420 07/08/18 0346 07/08/18 2158 07/09/18 0413 07/10/18 0418 07/11/18 0533  NA 153* 156* 157* 156* 156* 147*  K 3.2* 2.6* 6.2* 2.9* 3.0* 3.0*  CL 119* 123* 129* 126* 128* 121*  CO2 26 26 19* 25 22 21*  GLUCOSE 162* 175* 148* 172* 158* 201*  BUN 31* 32* 31* 28* 26* 22  CREATININE 1.04* 0.85 0.72 0.65 0.65 0.67  CALCIUM 8.2* 8.1* 8.4* 8.1* 8.2* 7.6*  MG 1.5* 2.2  --   --  1.8  --   PHOS 2.7  --   --   --   --   --    GFR: Estimated Creatinine Clearance: 70.6 mL/min (by C-G formula based on SCr of 0.67 mg/dL). Liver Function Tests: Recent Labs  Lab 07/05/18 0333 07/06/18 0424 07/07/18 0420 07/09/18 0413  AST 83* 48* 41 56*  ALT 335* 216* 164* 119*  ALKPHOS 259* 220* 220* 199*  BILITOT 3.9* 4.2* 4.7* 3.5*  PROT 3.9* 4.8* 4.8* 4.6*  ALBUMIN 2.0* 3.1* 2.8* 2.4*   No results for input(s): LIPASE, AMYLASE in the last 168 hours. Recent Labs  Lab 07/06/18 1533  AMMONIA 36*   Coagulation Profile: Recent Labs  Lab 07/06/18 0424  INR 1.65   Cardiac Enzymes: No results for input(s): CKTOTAL, CKMB, CKMBINDEX, TROPONINI in the last 168 hours. BNP (last 3 results) No results for input(s): PROBNP in the last 8760 hours. HbA1C: No results for input(s): HGBA1C in the last 72 hours. CBG: Recent Labs  Lab 07/10/18 1940 07/10/18 2302 07/11/18 0358 07/11/18 0732 07/11/18 1134  GLUCAP 166* 167* 170* 184* 142*   Lipid Profile: No results for input(s): CHOL, HDL, LDLCALC, TRIG, CHOLHDL, LDLDIRECT in the last 72  hours. Thyroid Function Tests: No results for input(s): TSH, T4TOTAL, FREET4, T3FREE, THYROIDAB in the last 72 hours. Anemia Panel: No results for input(s): VITAMINB12, FOLATE, FERRITIN, TIBC, IRON, RETICCTPCT in the last 72 hours. Sepsis Labs: Recent Labs  Lab 07/05/18 1151 07/06/18 0424 07/07/18 0420  PROCALCITON 2.03 1.85 1.17    Recent Results (from the past 240 hour(s))  Culture, respiratory (NON-Expectorated)     Status: None   Collection Time: 07/04/18  9:25 AM  Result Value Ref Range Status   Specimen Description TRACHEAL ASPIRATE  Final   Special Requests Normal  Final   Gram Stain   Final    FEW WBC PRESENT,BOTH PMN AND MONONUCLEAR NO ORGANISMS SEEN    Culture   Final    Consistent with normal respiratory flora. Performed at [facility], [place] [address], [place], [place] [address]    Report Status 07/06/2018 FINAL  Final  C difficile quick scan w PCR reflex     Status: None   Collection Time: 07/04/18  8:53 PM  Result Value Ref Range Status   C Diff antigen NEGATIVE NEGATIVE Final   C Diff toxin NEGATIVE NEGATIVE Final   C Diff interpretation No C. difficile detected.  Final    Comment: Performed at [facility], [place] [address] [place], [place] [address]         Radiology Studies: Dg Chest Port 1 View  Result Date: 07/10/2018 CLINICAL DATA:  Acute respiratory failure EXAM: PORTABLE CHEST 1 VIEW COMPARISON:  07/09/2018 FINDINGS: Jugular central line and feeding catheter are again seen and stable. The endotracheal tube has been removed in the interval. Cardiac shadow is enlarged but stable. The lungs are well aerated bilaterally. No focal infiltrate or sizable effusion is seen. No bony abnormality is noted. IMPRESSION: Tubes and lines as described above.  No acute abnormality seen. Electronically Signed   By: Inez Catalina M.D.   On: 07/10/2018 07:02   [place] Ekg Site Rite  Result Date: 07/10/2018 If Site Rite image not attached, placement could not be  confirmed due to current  cardiac rhythm.       Scheduled Meds: . busPIRone  10 mg Oral Daily  . chlorhexidine gluconate (MEDLINE KIT)  15 mL Mouth Rinse BID  . Chlorhexidine Gluconate Cloth  6 each Topical Daily  . Chlorhexidine Gluconate Cloth  6 each Topical Daily  . free water  450 mL Per Tube Q4H  . insulin aspart  0-9 Units Subcutaneous Q4H  . lactulose  30 g Per Tube TID  . mouth rinse  15 mL Mouth Rinse 10 times per day  . metoprolol tartrate  25 mg Per Tube BID  . multivitamin  15 mL Oral Daily  . pantoprazole sodium  40 mg Per Tube Q1200  . potassium chloride  40 mEq Per Tube Daily  . rifaximin  550 mg Per Tube TID  . sodium chloride flush  10-40 mL Intracatheter Q12H   Continuous Infusions: . dexmedetomidine (PRECEDEX) IV infusion Stopped (07/11/18 1001)  . dextrose 50 mL/hr at 07/11/18 1200  . feeding supplement (VITAL AF 1.2 CAL) 1,000 mL (07/11/18 0943)  . lacosamide (VIMPAT) IV Stopped (07/11/18 1033)  . levETIRAcetam 1,000 mg (07/11/18 1304)     LOS: 18 days    Time spent: 35 minutes.     Hosie Poisson, MD [facility] Pager [phone]  If 7PM-7AM, please contact night-coverage [email or web address] Password [email or web address] 07/11/2018, 1:09 PM

## 2018-07-11 NOTE — Consult Note (Signed)
[facility] Face-to-Face Psychiatry Consult   Reason for Consult: ''Schizophrenia, needs medication.'' Referring Physician:  Dr. Karleen Hampshire Patient Identification: Brandi [name] MRN:  [id number] Principal Diagnosis: Atrial fibrillation with RVR [name]) Diagnosis:   Patient Active Problem List   Diagnosis Date Noted  . Liver failure ([place]) [K72.90]   . Acute respiratory failure with hypoxemia (Brandi [name]) [J96.01]   . Shock circulatory ([place]) [R57.9]   . Acute encephalopathy [G93.40]   . Upper GI bleed [K92.2]   . Paroxysmal atrial fibrillation (HCC) [I48.0]   . Cerebral embolism with cerebral infarction [I63.40] 06/26/2018  . Atrial fibrillation with RVR ([place]) [I48.91] 06/23/2018  . Schizophrenia ([place]) [F20.9] 06/23/2018  . History of seizures [Z87.898] 06/23/2018  . Nausea and vomiting [R11.2] 06/23/2018  . SOB (shortness of breath) [R06.02] 06/12/2018  . Abnormal EKG [R94.31] 06/12/2018  . Elevated LFTs [R94.5] 06/12/2018  . Malaise [R53.81] 06/12/2018  . Hypertension associated with diabetes ([place]) [E11.59, I10] 12/05/2016  . Anemia [D64.9] 12/05/2016  . Convulsions/seizures (Brandi [name]) [R56.9] 11/02/2014  . Type 2 diabetes mellitus without complication, without long-term current use of insulin (Brandi [name]) [E11.9] 03/16/2014  . Leukocytosis [D72.829] 06/29/2013  . Thrombocytosis ([place]) [D47.3] 06/29/2013  . Major depression [F32.9] 09/10/2011  . Migraine headache [G43.909] 09/10/2011  . Obesity (BMI 30-39.9) [E66.9] 09/10/2011  . GERD (gastroesophageal reflux disease) [K21.9] 09/10/2011  . Allergic rhinitis due to pollen [J30.1] 09/10/2011  . History of breast cancer in female [Z85.3] 09/10/2011  . Hyperlipidemia associated with type 2 diabetes mellitus ([place]) [E11.69, E78.5] 09/10/2011    Total Time spent with patient: 45 minutes  Subjective:   Brandi [name] is a 62 y.o. female patient admitted with nausea, generalized weakness  HPI:  Patient with history of Schizoaffective disorder-depressed type,  severe anxiety and multiple medical problem. Patient is too weak to participate in psychiatric evaluation but information were obtained from her husband who is at her bedside. Husband reports that patient receives her psychiatric medications from [facility] in [place]. She was stabilized on Buspar 15 qam/30 qhs, Latuda 120 mg daily and Effexor ER 300 mg daily but patient has not taking Latuda/Effexor ER in more than 2 weeks. Patient appears restless, anxious, apprehensive but she is not able to verbalize her feelings. As a result, it is difficult to determine if she is psychotic, depressed, delusional, suicidal or homicidal.   Past Psychiatric History: as above  Risk to Self: Is patient at risk for suicide?: No Risk to Others:   Prior Inpatient Therapy:   Prior Outpatient Therapy:    Past Medical History:  Past Medical History:  Diagnosis Date  . Allergic rhinitis   . Anemia   . Arthritis HX RIGHT ANKLE FX  . Cancer [facility])    breast, lumpectomy  . Depression    MAJOR  . Dyslipidemia   . GERD (gastroesophageal reflux disease)   . History of breast cancer DX 2010--  S/P LUMPECTOMY AND RADIATION -- NO RECURRENCE   ONCOLOGIST- DR Brandi [name]  . Hypertension   . Left flank pain   . Left ureteral calculus   . Migraines   . Schizophrenic disorder (Brandi [name])   . Tonic-clonic seizure disorder (Brandi [name]) X1  2010--  NO SEIZURE SINCE  . Type II or unspecified type diabetes mellitus without mention of complication, not stated as uncontrolled 03/16/2014    Past Surgical History:  Procedure Laterality Date  . BREAST SURGERY  01-04-2009  DR Brandi [name]   LEFT BREAST LUMPECTOMY W/ SLN DISSECTION  FOR CANCER  . CATARACT  EXTRACTION, BILATERAL Bilateral   . CESAREAN SECTION  1998  . CHILD BIRTH     C-SECTION  . CYSTOSCOPY WITH RETROGRADE PYELOGRAM, URETEROSCOPY AND STENT PLACEMENT  11/10/2012   Procedure: CYSTOSCOPY WITH RETROGRADE PYELOGRAM, URETEROSCOPY AND STENT PLACEMENT;  Surgeon: Brandi Ben, MD;  Location: [place];  Service: Urology;  Laterality: Left;  WITH DOUBLE J STENT  . HOLMIUM LASER APPLICATION  53/00/5110   Procedure: HOLMIUM LASER APPLICATION;  Surgeon: Brandi Ben, MD;  Location: [place];  Service: Urology;  Laterality: Left;  . LAPAROSCOPIC CHOLECYSTECTOMY  1990'S   Family History:  Family History  Problem Relation Age of Onset  . Cancer Sister        breast  . Cancer Mother        breast  . Cancer Father        lung  . Mental illness Maternal Uncle   . Cancer Maternal Grandmother   . Stroke Maternal Grandmother   . Tuberculosis Maternal Grandfather   . Cancer Paternal Grandmother   . Seizures Neg Hx    Family Psychiatric  History:  Social History:  Social History   Substance and Sexual Activity  Alcohol Use No     Social History   Substance and Sexual Activity  Drug Use No    Social History   Socioeconomic History  . Marital status: Married    Spouse name: Not on file  . Number of children: Not on file  . Years of education: Not on file  . Highest education level: Not on file  Occupational History  . Not on file  Social Needs  . Financial resource strain: Not on file  . Food insecurity:    Worry: Not on file    Inability: Not on file  . Transportation needs:    Medical: Not on file    Non-medical: Not on file  Tobacco Use  . Smoking status: Never Smoker  . Smokeless tobacco: Never Used  Substance and Sexual Activity  . Alcohol use: No  . Drug use: No  . Sexual activity: Not Currently  Lifestyle  . Physical activity:    Days per week: Not on file    Minutes per session: Not on file  . Stress: Not on file  Relationships  . Social connections:    Talks on phone: Not on file    Gets together: Not on file    Attends religious service: Not on file    Active member of club or organization: Not on file    Attends meetings of clubs or organizations: Not on file    Relationship status:  Not on file  Other Topics Concern  . Not on file  Social History Narrative  . Not on file   Additional Social History:    Allergies:  No Known Allergies  Labs:  Results for orders placed or performed during the hospital encounter of 06/23/18 (from the past 48 hour(s))  Osmolality, urine     Status: None   Collection Time: 07/09/18  6:08 PM  Result Value Ref Range   Osmolality, Ur 690 300 - 900 mOsm/kg    Comment: Performed at [facility], [address] [place], [place] [address]  Urinalysis, Routine w reflex microscopic     Status: Abnormal   Collection Time: 07/09/18  6:08 PM  Result Value Ref Range   Color, Urine AMBER (A) YELLOW    Comment: BIOCHEMICALS MAY BE AFFECTED BY COLOR  APPearance CLEAR CLEAR   Specific Gravity, Urine 1.021 1.005 - 1.030   pH 5.0 5.0 - 8.0   Glucose, UA NEGATIVE NEGATIVE mg/dL   Hgb urine dipstick NEGATIVE NEGATIVE   Bilirubin Urine NEGATIVE NEGATIVE   Ketones, ur NEGATIVE NEGATIVE mg/dL   Protein, ur NEGATIVE NEGATIVE mg/dL   Nitrite NEGATIVE NEGATIVE   Leukocytes, UA NEGATIVE NEGATIVE    Comment: Performed at [facility] [address], [place], [place] [address]  Sodium, urine, random     Status: None   Collection Time: 07/09/18  6:09 PM  Result Value Ref Range   Sodium, Ur 22 mmol/L    Comment: Performed at [facility] [address], [place], [place] [address]  Glucose, capillary     Status: Abnormal   Collection Time: 07/09/18  7:31 PM  Result Value Ref Range   Glucose-Capillary 148 (H) 70 - 99 mg/dL  Glucose, capillary     Status: Abnormal   Collection Time: 07/10/18 12:05 AM  Result Value Ref Range   Glucose-Capillary 187 (H) 70 - 99 mg/dL  Glucose, capillary     Status: Abnormal   Collection Time: 07/10/18  4:07 AM  Result Value Ref Range   Glucose-Capillary 171 (H) 70 - 99 mg/dL  Osmolality     Status: Abnormal   Collection Time: 07/10/18  4:18 AM  Result Value Ref Range   Osmolality 328 (HH) 275 -  295 mOsm/kg    Comment: REPEATED TO VERIFY CRITICAL RESULT CALLED TO, READ BACK BY AND VERIFIED WITH: [facility] RN 07/10/18 AT 0459 SKEEN,P Performed at [facility], [place] [address] [place], [place] [address]   Magnesium     Status: None   Collection Time: 07/10/18  4:18 AM  Result Value Ref Range   Magnesium 1.8 1.7 - 2.4 mg/dL    Comment: Performed at [facility] [address], [place], [place] [address]  CBC     Status: Abnormal   Collection Time: 07/10/18  4:18 AM  Result Value Ref Range   WBC 13.4 (H) 4.0 - 10.5 K/uL   RBC 4.34 3.87 - 5.11 MIL/uL   Hemoglobin 9.2 (L) 12.0 - 15.0 g/dL   HCT 33.0 (L) 36.0 - 46.0 %   MCV 76.0 (L) 78.0 - 100.0 fL   MCH 21.2 (L) 26.0 - 34.0 pg   MCHC 27.9 (L) 30.0 - 36.0 g/dL   RDW 28.3 (H) 11.5 - 15.5 %   Platelets PLATELET CLUMPS NOTED ON SMEAR, UNABLE TO ESTIMATE 150 - 400 K/uL    Comment: Performed at [facility] [address], [place], [place] [address]  Basic metabolic panel     Status: Abnormal   Collection Time: 07/10/18  4:18 AM  Result Value Ref Range   Sodium 156 (H) 135 - 145 mmol/L   Potassium 3.0 (L) 3.5 - 5.1 mmol/L   Chloride 128 (H) 98 - 111 mmol/L   CO2 22 22 - 32 mmol/L   Glucose, Bld 158 (H) 70 - 99 mg/dL   BUN 26 (H) 8 - 23 mg/dL   Creatinine, Ser 0.65 0.44 - 1.00 mg/dL   Calcium 8.2 (L) 8.9 - 10.3 mg/dL   GFR calc non Af Amer >60 >60 mL/min   GFR calc Af Amer >60 >60 mL/min    Comment: (NOTE) The eGFR has been calculated using the CKD EPI equation. This calculation has not been validated in all clinical situations. eGFR's persistently <60 mL/min  signify possible Chronic Kidney Disease.    Anion gap 6 5 - 15    Comment: Performed at [facility] [address] [place], [place] [address]  Glucose, capillary     Status: Abnormal   Collection Time: 07/10/18  7:53 AM  Result Value Ref Range   Glucose-Capillary 132 (H) 70 - 99 mg/dL  Glucose, capillary     Status: Abnormal   Collection  Time: 07/10/18  2:16 PM  Result Value Ref Range   Glucose-Capillary 130 (H) 70 - 99 mg/dL  Glucose, capillary     Status: Abnormal   Collection Time: 07/10/18  7:40 PM  Result Value Ref Range   Glucose-Capillary 166 (H) 70 - 99 mg/dL  Glucose, capillary     Status: Abnormal   Collection Time: 07/10/18 11:02 PM  Result Value Ref Range   Glucose-Capillary 167 (H) 70 - 99 mg/dL  Glucose, capillary     Status: Abnormal   Collection Time: 07/11/18  3:58 AM  Result Value Ref Range   Glucose-Capillary 170 (H) 70 - 99 mg/dL  Basic metabolic panel     Status: Abnormal   Collection Time: 07/11/18  5:33 AM  Result Value Ref Range   Sodium 147 (H) 135 - 145 mmol/L    Comment: DELTA CHECK NOTED   Potassium 3.0 (L) 3.5 - 5.1 mmol/L   Chloride 121 (H) 98 - 111 mmol/L   CO2 21 (L) 22 - 32 mmol/L   Glucose, Bld 201 (H) 70 - 99 mg/dL   BUN 22 8 - 23 mg/dL   Creatinine, Ser 0.67 0.44 - 1.00 mg/dL   Calcium 7.6 (L) 8.9 - 10.3 mg/dL   GFR calc non Af Amer >60 >60 mL/min   GFR calc Af Amer >60 >60 mL/min    Comment: (NOTE) The eGFR has been calculated using the CKD EPI equation. This calculation has not been validated in all clinical situations. eGFR's persistently <60 mL/min signify possible Chronic Kidney Disease.    Anion gap 5 5 - 15    Comment: Performed at [facility] [address], [place], [place] [address]  CBC     Status: Abnormal   Collection Time: 07/11/18  5:33 AM  Result Value Ref Range   WBC 10.3 4.0 - 10.5 K/uL   RBC 3.49 (L) 3.87 - 5.11 MIL/uL   Hemoglobin 7.7 (L) 12.0 - 15.0 g/dL   HCT 26.6 (L) 36.0 - 46.0 %   MCV 76.2 (L) 78.0 - 100.0 fL   MCH 22.1 (L) 26.0 - 34.0 pg   MCHC 28.9 (L) 30.0 - 36.0 g/dL   RDW 28.2 (H) 11.5 - 15.5 %   Platelets 88 (L) 150 - 400 K/uL    Comment: SPECIMEN CHECKED FOR CLOTS REPEATED TO VERIFY PLATELET COUNT CONFIRMED BY SMEAR Performed at [facility], [address] [place], [place] [address]   Glucose, capillary      Status: Abnormal   Collection Time: 07/11/18  7:32 AM  Result Value Ref Range   Glucose-Capillary 184 (H) 70 - 99 mg/dL  Glucose, capillary     Status: Abnormal   Collection Time: 07/11/18 11:34 AM  Result Value Ref Range   Glucose-Capillary 142 (H) 70 - 99 mg/dL  Magnesium     Status: Abnormal   Collection Time: 07/11/18  1:30 PM  Result Value Ref Range   Magnesium 1.5 (L) 1.7 - 2.4 mg/dL    Comment: Performed at [facility],  [address], [place], [place] [address]  Ammonia     Status: None   Collection Time: 07/11/18  2:13 PM  Result Value Ref Range   Ammonia 33 9 - 35 umol/L    Comment: Performed at [facility], [place] [address] [place], [place] [address]  Glucose, capillary     Status: None   Collection Time: 07/11/18  3:41 PM  Result Value Ref Range   Glucose-Capillary 90 70 - 99 mg/dL    Current Facility-Administered Medications  Medication Dose Route Frequency Provider Last Rate Last Dose  . busPIRone (BUSPAR) tablet 10 mg  10 mg Oral Daily Ollis, Brandi L, NP   10 mg at 07/11/18 1002  . chlorhexidine gluconate (MEDLINE KIT) (PERIDEX) 0.12 % solution 15 mL  15 mL Mouth Rinse BID Rush Farmer, MD   15 mL at 07/11/18 0750  . Chlorhexidine Gluconate Cloth 2 % PADS 6 each  6 each Topical Daily Rush Farmer, MD   6 each at 07/11/18 [phone]  . Chlorhexidine Gluconate Cloth 2 % PADS 6 each  6 each Topical Daily Marshell Garfinkel, MD   6 each at 07/11/18 0943  . dexmedetomidine (PRECEDEX) 400 MCG/100ML (4 mcg/mL) infusion  0.2-0.8 mcg/kg/hr Intravenous Titrated Trevor Mace U, MD 11.3 mL/hr at 07/11/18 1550 0.6 mcg/kg/hr at 07/11/18 1550  . dextrose 5 % solution   Intravenous Continuous Erick Colace, NP 50 mL/hr at 07/11/18 1600    . feeding supplement (VITAL AF 1.2 CAL) liquid 1,000 mL  1,000 mL Per Tube Continuous Marshell Garfinkel, MD   Stopped at 07/11/18 1400  . free water 450 mL  450 mL Per Tube Q4H Ollis, Brandi L, NP   450 mL at 07/11/18 1506  .  ibuprofen (ADVIL,MOTRIN) 100 MG/5ML suspension 400 mg  400 mg Oral Q6H PRN Mannam, Praveen, MD   400 mg at 07/11/18 1550  . insulin aspart (novoLOG) injection 0-9 Units  0-9 Units Subcutaneous Q4H Ollis, Brandi L, NP   1 Units at 07/11/18 1225  . lacosamide (VIMPAT) 100 mg in sodium chloride 0.9 % 25 mL IVPB  100 mg Intravenous Q12H Corey Harold, NP   Stopped at 07/11/18 1033  . lactulose (CHRONULAC) 10 GM/15ML solution 30 g  30 g Per Tube TID Erick Colace, NP   30 g at 07/10/18 2225  . levETIRAcetam (KEPPRA) IVPB 1000 mg/100 mL premix  1,000 mg Intravenous Q12H Corey Harold, NP   Stopped at 07/11/18 1319  . MEDLINE mouth rinse  15 mL Mouth Rinse 10 times per day Rush Farmer, MD   15 mL at 07/11/18 1507  . metoprolol tartrate (LOPRESSOR) 25 mg/10 mL oral suspension 25 mg  25 mg Per Tube BID Alfredo Martinez, Brandi L, NP   25 mg at 07/10/18 2225  . metoprolol tartrate (LOPRESSOR) injection 2.5-5 mg  2.5-5 mg Intravenous Q3H PRN Ollis, Brandi L, NP      . multivitamin liquid 15 mL  15 mL Oral Daily Mannam, Praveen, MD   15 mL at 07/11/18 1003  . ondansetron (ZOFRAN) tablet 4 mg  4 mg Oral Q6H PRN Patrecia Pour, MD       Or  . ondansetron [facility]) injection 4 mg  4 mg Intravenous Q6H PRN Patrecia Pour, MD   4 mg at 07/11/18 1332  . pantoprazole sodium (PROTONIX) 40 mg/20 mL oral suspension 40 mg  40 mg Per Tube Q1200 Erick Colace, NP   40 mg  at 07/11/18 1224  . potassium chloride (KLOR-CON) packet 40 mEq  40 mEq Oral Once Hosie Poisson, MD      . potassium chloride 20 MEQ/15ML (10%) solution 40 mEq  40 mEq Per Tube Daily Ollis, Brandi L, NP   40 mEq at 07/11/18 1002  . rifaximin (XIFAXAN) tablet 550 mg  550 mg Per Tube TID Erick Colace, NP   550 mg at 07/11/18 1551  . sodium chloride flush (NS) 0.9 % injection 10-40 mL  10-40 mL Intracatheter PRN Rush Farmer, MD   10 mL at 07/09/18 2147  . sodium chloride flush (NS) 0.9 % injection 10-40 mL  10-40 mL Intracatheter Q12H Mannam, Praveen,  MD   10 mL at 07/11/18 1004  . sodium chloride flush (NS) 0.9 % injection 10-40 mL  10-40 mL Intracatheter PRN Mannam, Hart Robinsons, MD        Musculoskeletal: Strength & Muscle Tone: not tested Gait & Station: unable to stand Patient leans: N/A  Psychiatric Specialty Exam: Physical Exam  Psychiatric: Judgment and thought content normal. Her mood appears anxious. Her affect is blunt. Her speech is delayed. She is slowed.    Review of Systems  Unable to perform ROS: Medical condition    Blood pressure (!) 86/49, pulse 80, temperature 98.8 F (37.1 C), temperature source Axillary, resp. rate (!) 31, height '5\' 2"'  (1.575 m), weight 78 kg (171 lb 15.3 oz), SpO2 (!) 73 %.Body mass index is 31.45 kg/m.  General Appearance: Casual  Eye Contact:  Poor  Speech:  Slow  Volume:  Decreased  Mood:  Anxious and Irritable  Affect:  Congruent  Thought Process:  Disorganized  Orientation:  Other:  unable to assess  Thought Content:  Illogical  Suicidal Thoughts:  unable to assess  Homicidal Thoughts:  unable to assess  Memory:  unable to assess  Judgement:  Other:  unable to assess  Insight:  unable to assess  Psychomotor Activity:  Restlessness  Concentration:  Concentration: Poor and Attention Span: Poor  Recall:  unable to assess  Fund of Knowledge:  unable to assess  Language:  Fair  Akathisia:  No  Handed:  Right  AIMS (if indicated):     Assets:  Housing Social Support  ADL's:  Impaired  Cognition:  WNL  Sleep:   fair     Treatment Plan Summary: Patient is a 62 year old female with a history of Schizoaffective disorder-depressed type, Anxiety, hypertension, hyperlipidemia,  Seizures disorder,  left breast cancer with prior lumpectomy who was admitted due to intractable nausea and generalized body weakness. Patient has been able to take her psychiatric medications for the past 2 weeks per her husband. Except for anxiety, restlessness and confusion, patient is not overtly psychotic or  paranoid.  Plan/Recommendations: -Lab reviewed revealed elevated serum Creatinine and low platelet-Do not resume Latuda-can induce elevation of serum creatinine. -Do not resume Effexor XR may worsen dizziness, nausea and Anorexia -May consider Abilify 2 mg daily for Mood/schizoaffective disorder. -May increase Buspar 10 mg to twice daily for anxiety. -Psych service signing out at this time, no acute psychiatric intervention warranted.  Disposition:  Re-consult when patient is more alert.   Corena Pilgrim, MD 07/11/2018 4:17 PM

## 2018-07-11 NOTE — Evaluation (Signed)
Clinical/Bedside Swallow Evaluation Patient Details  Name: Brandi [name] MRN: [id number] Date of Birth: [birth year]  Today's Date: 07/11/2018 Time: SLP Start Time (ACUTE ONLY): 1457 SLP Stop Time (ACUTE ONLY): 1520 SLP Time Calculation (min) (ACUTE ONLY): 23 min  Past Medical History:  Past Medical History:  Diagnosis Date  . Allergic rhinitis   . Anemia   . Arthritis HX RIGHT ANKLE FX  . Cancer [facility])    breast, lumpectomy  . Depression    MAJOR  . Dyslipidemia   . GERD (gastroesophageal reflux disease)   . History of breast cancer DX 2010--  S/P LUMPECTOMY AND RADIATION -- NO RECURRENCE   ONCOLOGIST- DR Truddie Coco  . Hypertension   . Left flank pain   . Left ureteral calculus   . Migraines   . Schizophrenic disorder ([place])   . Tonic-clonic seizure disorder ([place]) X1  2010--  NO SEIZURE SINCE  . Type II or unspecified type diabetes mellitus without mention of complication, not stated as uncontrolled 03/16/2014   Past Surgical History:  Past Surgical History:  Procedure Laterality Date  . BREAST SURGERY  01-04-2009  DR Margot Chimes   LEFT BREAST LUMPECTOMY W/ SLN DISSECTION  FOR CANCER  . CATARACT EXTRACTION, BILATERAL Bilateral   . CESAREAN SECTION  1998  . CHILD BIRTH     C-SECTION  . CYSTOSCOPY WITH RETROGRADE PYELOGRAM, URETEROSCOPY AND STENT PLACEMENT  11/10/2012   Procedure: CYSTOSCOPY WITH RETROGRADE PYELOGRAM, URETEROSCOPY AND STENT PLACEMENT;  Surgeon: Hanley Ben, MD;  Location: [facility];  Service: Urology;  Laterality: Left;  WITH DOUBLE J STENT  . HOLMIUM LASER APPLICATION  63/87/5643   Procedure: HOLMIUM LASER APPLICATION;  Surgeon: Hanley Ben, MD;  Location: [facility];  Service: Urology;  Laterality: Left;  . LAPAROSCOPIC CHOLECYSTECTOMY  1990'S   HPI:  62 year old female admitted 06/23/18 with nausea, decreased po intake. MRI = acute infarct right frontal operculum. BSE 7/12 recommended NPO due to mentation. She was briefly  started on Dys 1 diet and thin liquids but on 7/15,  pt's condition worsened with increased confusion/lethargy, tardive dyskinesia symptoms, AKI, shock liver and respiratory failure requiring intubation.  Extubated 7/25. PMH: HTN, HLD, depression, schizophrenia, Left breast CA, GERD, DM.    Assessment / Plan / Recommendation Clinical Impression  Pt apepars to have poor oral control with mastication-like movements at baseline. She has limited labial seal around the cup with anterior spillage, which improves with use of a straw or spoon. Hyolaryngeal movement is difficult to palpate. No overt signs of aspiration are noted with small amounts of water, but as volume increases she begins to have throat clearing and then ultimately coughing. Her reflexive cough seems strong, but her volitional cough is subjectively weak. Pt's husband believes that her voice is still hoarse, but that is has improved signifcantly since extubation two days ago. He is eager for her to start eating and drinking something. Recommend keeping her NPO except for limited trials of ice with RN supervision after oral care, in order to moisten her oral mucosa and facilitate use of the swallowing musculature. Will f/u for readiness to participate in MBS from a cognitive and respiratory standpoint. SLP Visit Diagnosis: Dysphagia, unspecified (R13.10)    Aspiration Risk  Moderate aspiration risk    Diet Recommendation NPO(few ice chips with RN only after oral care)   Medication Administration: Via alternative means(pt has cortrak)    Other  Recommendations Oral Care Recommendations: Oral care QID Other Recommendations: Have oral  suction available   Follow up Recommendations (tba)      Frequency and Duration            Prognosis Prognosis for Safe Diet Advancement: Good      Swallow Study   General HPI: 62 year old female admitted 06/23/18 with nausea, decreased po intake. MRI = acute infarct right frontal operculum. BSE 7/12  recommended NPO due to mentation. She was briefly started on Dys 1 diet and thin liquids but on 7/15,  pt's condition worsened with increased confusion/lethargy, tardive dyskinesia symptoms, AKI, shock liver and respiratory failure requiring intubation.  Extubated 7/25. PMH: HTN, HLD, depression, schizophrenia, Left breast CA, GERD, DM.  Type of Study: Bedside Swallow Evaluation Previous Swallow Assessment: see HPI Diet Prior to this Study: NPO;NG Tube Temperature Spikes Noted: No Respiratory Status: Nasal cannula History of Recent Intubation: Yes Length of Intubations (days): 11 days Date extubated: 07/09/18 Behavior/Cognition: Alert;Cooperative;Requires cueing Oral Cavity Assessment: Other (comment)(a lot of dried skin around lips) Oral Care Completed by SLP: Recent completion by staff Oral Cavity - Dentition: Adequate natural dentition Self-Feeding Abilities: Total assist Patient Positioning: Upright in bed Baseline Vocal Quality: Hoarse Volitional Cough: Weak Volitional Swallow: Unable to elicit    Oral/Motor/Sensory Function Overall Oral Motor/Sensory Function: Other (comment)(not following commands for assessment)   Ice Chips Ice chips: Not tested   Thin Liquid Thin Liquid: Impaired Presentation: Cup;Spoon;Straw Oral Phase Impairments: Reduced labial seal;Poor awareness of bolus Oral Phase Functional Implications: Left anterior spillage;Right anterior spillage Pharyngeal  Phase Impairments: Suspected delayed Swallow;Throat Clearing - Immediate;Cough - Immediate    Nectar Thick Nectar Thick Liquid: Not tested   Honey Thick Honey Thick Liquid: Not tested   Puree Puree: Not tested   Solid     Solid: Not tested      Germain Osgood 07/11/2018,3:48 PM  Germain Osgood, M.A. CCC-SLP [phone]

## 2018-07-12 ENCOUNTER — Inpatient Hospital Stay (HOSPITAL_COMMUNITY): Payer: Medicare Other

## 2018-07-12 ENCOUNTER — Other Ambulatory Visit: Payer: Self-pay

## 2018-07-12 ENCOUNTER — Inpatient Hospital Stay (HOSPITAL_COMMUNITY): Payer: Medicare Other | Admitting: Certified Registered Nurse Anesthetist

## 2018-07-12 ENCOUNTER — Encounter (HOSPITAL_COMMUNITY)
Admission: EM | Disposition: A | Discharge status: Nursing Home (Includes ICF) | Payer: Self-pay | Source: Home / Self Care | Attending: Pulmonary Disease

## 2018-07-12 DIAGNOSIS — D696 Thrombocytopenia, unspecified: Secondary | ICD-10-CM

## 2018-07-12 DIAGNOSIS — K7469 Other cirrhosis of liver: Secondary | ICD-10-CM

## 2018-07-12 DIAGNOSIS — D62 Acute posthemorrhagic anemia: Secondary | ICD-10-CM

## 2018-07-12 DIAGNOSIS — K920 Hematemesis: Secondary | ICD-10-CM

## 2018-07-12 DIAGNOSIS — D689 Coagulation defect, unspecified: Secondary | ICD-10-CM

## 2018-07-12 HISTORY — PX: LAPAROTOMY: SHX154

## 2018-07-12 LAB — BASIC METABOLIC PANEL
Anion gap: 11 (ref 5–15)
BUN: 37 mg/dL — AB (ref 8–23)
CHLORIDE: 117 mmol/L — AB (ref 98–111)
CO2: 14 mmol/L — AB (ref 22–32)
Calcium: 7.7 mg/dL — ABNORMAL LOW (ref 8.9–10.3)
Creatinine, Ser: 1.04 mg/dL — ABNORMAL HIGH (ref 0.44–1.00)
GFR calc Af Amer: >60 mL/min (ref >60)
GFR calc non Af Amer: 56 mL/min — ABNORMAL LOW (ref >60)
GLUCOSE: 223 mg/dL — AB (ref 70–99)
POTASSIUM: 5.5 mmol/L — AB (ref 3.5–5.1)
Sodium: 142 mmol/L (ref 135–145)

## 2018-07-12 LAB — CBC
HCT: 27.1 % — ABNORMAL LOW (ref 36.0–46.0)
HEMATOCRIT: 32.9 % — AB (ref 36.0–46.0)
HEMATOCRIT: 26.1 % — AB (ref 36.0–46.0)
HEMOGLOBIN: 9.5 g/dL — AB (ref 12.0–15.0)
Hemoglobin: 7.7 g/dL — ABNORMAL LOW (ref 12.0–15.0)
Hemoglobin: 7.7 g/dL — ABNORMAL LOW (ref 12.0–15.0)
MCH: 22 pg — AB (ref 26.0–34.0)
MCH: 22.2 pg — AB (ref 26.0–34.0)
MCH: 22.2 pg — ABNORMAL LOW (ref 26.0–34.0)
MCHC: 28.9 g/dL — AB (ref 30.0–36.0)
MCHC: 29.5 g/dL — AB (ref 30.0–36.0)
MCHC: 28.4 g/dL — ABNORMAL LOW (ref 30.0–36.0)
MCV: 76.2 fL — ABNORMAL LOW (ref 78.0–100.0)
MCV: 75.2 fL — AB (ref 78.0–100.0)
MCV: 78.1 fL (ref 78.0–100.0)
PLATELETS: 112 10*3/uL — AB (ref 150–400)
Platelets: 140 10*3/uL — ABNORMAL LOW (ref 150–400)
Platelets: 82 10*3/uL — ABNORMAL LOW (ref 150–400)
RBC: 4.32 MIL/uL (ref 3.87–5.11)
RBC: 3.47 MIL/uL — ABNORMAL LOW (ref 3.87–5.11)
RBC: 3.47 MIL/uL — ABNORMAL LOW (ref 3.87–5.11)
RDW: 30 % — AB (ref 11.5–15.5)
RDW: 29.5 % — AB (ref 11.5–15.5)
RDW: 30 % — ABNORMAL HIGH (ref 11.5–15.5)
WBC: 18.6 10*3/uL — ABNORMAL HIGH (ref 4.0–10.5)
WBC: 15.6 10*3/uL — ABNORMAL HIGH (ref 4.0–10.5)
WBC: 4.4 10*3/uL (ref 4.0–10.5)

## 2018-07-12 LAB — CBC WITH DIFFERENTIAL/PLATELET
BASOS PCT: 0 %
Basophils Absolute: 0 10*3/uL (ref 0.0–0.1)
EOS PCT: 0 %
Eosinophils Absolute: 0 10*3/uL (ref 0.0–0.7)
HEMATOCRIT: 16.5 % — AB (ref 36.0–46.0)
HEMOGLOBIN: 4.8 g/dL — AB (ref 12.0–15.0)
LYMPHS PCT: 24 %
Lymphs Abs: 0.4 10*3/uL — ABNORMAL LOW (ref 0.7–4.0)
MCH: 21.7 pg — ABNORMAL LOW (ref 26.0–34.0)
MCHC: 29.1 g/dL — ABNORMAL LOW (ref 30.0–36.0)
MCV: 74.7 fL — ABNORMAL LOW (ref 78.0–100.0)
MONOS PCT: 5 %
Monocytes Absolute: 0.1 10*3/uL (ref 0.1–1.0)
NEUTROS PCT: 71 %
Neutro Abs: 1 10*3/uL — ABNORMAL LOW (ref 1.7–7.7)
Platelets: 67 10*3/uL — ABNORMAL LOW (ref 150–400)
RBC: 2.21 MIL/uL — ABNORMAL LOW (ref 3.87–5.11)
RDW: 29.5 % — AB (ref 11.5–15.5)
WBC MORPHOLOGY: INCREASED
WBC: 1.5 10*3/uL — AB (ref 4.0–10.5)

## 2018-07-12 LAB — POCT I-STAT, CHEM 8
BUN: 47 mg/dL — ABNORMAL HIGH (ref 8–23)
Calcium, Ion: 1.24 mmol/L (ref 1.15–1.40)
Chloride: 109 mmol/L (ref 98–111)
Creatinine, Ser: 0.9 mg/dL (ref 0.44–1.00)
Glucose, Bld: 186 mg/dL — ABNORMAL HIGH (ref 70–99)
HEMATOCRIT: 16 % — AB (ref 36.0–46.0)
HEMOGLOBIN: 5.4 g/dL — AB (ref 12.0–15.0)
Potassium: 3.7 mmol/L (ref 3.5–5.1)
Sodium: 145 mmol/L (ref 135–145)
TCO2: 18 mmol/L — AB (ref 22–32)

## 2018-07-12 LAB — POCT I-STAT 3, ART BLOOD GAS (G3+)
Acid-base deficit: 12 mmol/L — ABNORMAL HIGH (ref 0.0–2.0)
Acid-base deficit: 9 mmol/L — ABNORMAL HIGH (ref 0.0–2.0)
BICARBONATE: 14.6 mmol/L — AB (ref 20.0–28.0)
Bicarbonate: 17.2 mmol/L — ABNORMAL LOW (ref 20.0–28.0)
O2 Saturation: 100 %
O2 Saturation: 90 %
PCO2 ART: 35 mmHg (ref 32.0–48.0)
PH ART: 7.255 — AB (ref 7.350–7.450)
PO2 ART: 329 mmHg — AB (ref 83.0–108.0)
Patient temperature: 98.3
TCO2: 16 mmol/L — AB (ref 22–32)
TCO2: 18 mmol/L — ABNORMAL LOW (ref 22–32)
pCO2 arterial: 38.8 mmHg (ref 32.0–48.0)
pH, Arterial: 7.23 — ABNORMAL LOW (ref 7.350–7.450)
pO2, Arterial: 66 mmHg — ABNORMAL LOW (ref 83.0–108.0)

## 2018-07-12 LAB — COMPREHENSIVE METABOLIC PANEL
ALBUMIN: 2.2 g/dL — AB (ref 3.5–5.0)
ALK PHOS: 157 U/L — AB (ref 38–126)
ALT: 89 U/L — ABNORMAL HIGH (ref 0–44)
ALT: 78 U/L — AB (ref 0–44)
ALT: 44 U/L (ref 0–44)
ANION GAP: 10 (ref 5–15)
AST: 44 U/L — AB (ref 15–41)
AST: 68 U/L — ABNORMAL HIGH (ref 15–41)
AST: 74 U/L — ABNORMAL HIGH (ref 15–41)
Albumin: 1.5 g/dL — ABNORMAL LOW (ref 3.5–5.0)
Albumin: 2.3 g/dL — ABNORMAL LOW (ref 3.5–5.0)
Alkaline Phosphatase: 117 U/L (ref 38–126)
Alkaline Phosphatase: 47 U/L (ref 38–126)
Anion gap: 18 — ABNORMAL HIGH (ref 5–15)
Anion gap: 12 (ref 5–15)
BILIRUBIN TOTAL: 3.8 mg/dL — AB (ref 0.3–1.2)
BUN: 30 mg/dL — AB (ref 8–23)
BUN: 38 mg/dL — ABNORMAL HIGH (ref 8–23)
BUN: 34 mg/dL — ABNORMAL HIGH (ref 8–23)
CHLORIDE: 112 mmol/L — AB (ref 98–111)
CO2: 16 mmol/L — AB (ref 22–32)
CO2: 16 mmol/L — ABNORMAL LOW (ref 22–32)
CO2: 27 mmol/L (ref 22–32)
CREATININE: 1.26 mg/dL — AB (ref 0.44–1.00)
Calcium: 7.8 mg/dL — ABNORMAL LOW (ref 8.9–10.3)
Calcium: 7.6 mg/dL — ABNORMAL LOW (ref 8.9–10.3)
Calcium: 7.6 mg/dL — ABNORMAL LOW (ref 8.9–10.3)
Chloride: 114 mmol/L — ABNORMAL HIGH (ref 98–111)
Chloride: 116 mmol/L — ABNORMAL HIGH (ref 98–111)
Creatinine, Ser: 0.94 mg/dL (ref 0.44–1.00)
Creatinine, Ser: 1.1 mg/dL — ABNORMAL HIGH (ref 0.44–1.00)
GFR calc Af Amer: >60 mL/min (ref >60)
GFR calc Af Amer: 52 mL/min — ABNORMAL LOW (ref >60)
GFR calc Af Amer: >60 mL/min (ref >60)
GFR calc non Af Amer: >60 mL/min (ref >60)
GFR calc non Af Amer: 45 mL/min — ABNORMAL LOW (ref >60)
GFR calc non Af Amer: 53 mL/min — ABNORMAL LOW (ref >60)
Glucose, Bld: 208 mg/dL — ABNORMAL HIGH (ref 70–99)
Glucose, Bld: 206 mg/dL — ABNORMAL HIGH (ref 70–99)
Glucose, Bld: 195 mg/dL — ABNORMAL HIGH (ref 70–99)
POTASSIUM: 3.6 mmol/L (ref 3.5–5.1)
POTASSIUM: 3.4 mmol/L — AB (ref 3.5–5.1)
Potassium: 4.9 mmol/L (ref 3.5–5.1)
SODIUM: 140 mmol/L (ref 135–145)
SODIUM: 151 mmol/L — AB (ref 135–145)
Sodium: 150 mmol/L — ABNORMAL HIGH (ref 135–145)
TOTAL PROTEIN: 4.3 g/dL — AB (ref 6.5–8.1)
Total Bilirubin: 2.5 mg/dL — ABNORMAL HIGH (ref 0.3–1.2)
Total Bilirubin: 2.7 mg/dL — ABNORMAL HIGH (ref 0.3–1.2)
Total Protein: 3.2 g/dL — ABNORMAL LOW (ref 6.5–8.1)
Total Protein: 3.4 g/dL — ABNORMAL LOW (ref 6.5–8.1)

## 2018-07-12 LAB — TROPONIN I
TROPONIN I: 0.07 ng/mL — AB (ref <0.03)
Troponin I: 0.03 ng/mL (ref <0.03)
Troponin I: 0.04 ng/mL (ref <0.03)
Troponin I: 0.07 ng/mL (ref <0.03)

## 2018-07-12 LAB — LACTIC ACID, PLASMA
LACTIC ACID, VENOUS: 5.8 mmol/L — AB (ref 0.5–1.9)
LACTIC ACID, VENOUS: 8.4 mmol/L — AB (ref 0.5–1.9)
Lactic Acid, Venous: 12.3 mmol/L (ref 0.5–1.9)

## 2018-07-12 LAB — PROTIME-INR
INR: 1.64
INR: 2.16
PROTHROMBIN TIME: 23.9 s — AB (ref 11.4–15.2)
Prothrombin Time: 19.3 seconds — ABNORMAL HIGH (ref 11.4–15.2)

## 2018-07-12 LAB — HEMOGLOBIN AND HEMATOCRIT, BLOOD
HCT: 32.3 % — ABNORMAL LOW (ref 36.0–46.0)
Hemoglobin: 9.1 g/dL — ABNORMAL LOW (ref 12.0–15.0)

## 2018-07-12 LAB — POCT I-STAT 7, (LYTES, BLD GAS, ICA,H+H)
ACID-BASE DEFICIT: 10 mmol/L — AB (ref 0.0–2.0)
Bicarbonate: 16.7 mmol/L — ABNORMAL LOW (ref 20.0–28.0)
Calcium, Ion: 1.2 mmol/L (ref 1.15–1.40)
HCT: 27 % — ABNORMAL LOW (ref 36.0–46.0)
HEMOGLOBIN: 9.2 g/dL — AB (ref 12.0–15.0)
O2 Saturation: 99 %
PH ART: 7.226 — AB (ref 7.350–7.450)
PO2 ART: 139 mmHg — AB (ref 83.0–108.0)
Potassium: 4.3 mmol/L (ref 3.5–5.1)
Sodium: 145 mmol/L (ref 135–145)
TCO2: 18 mmol/L — ABNORMAL LOW (ref 22–32)
pCO2 arterial: 40.2 mmHg (ref 32.0–48.0)

## 2018-07-12 LAB — GLUCOSE, CAPILLARY
GLUCOSE-CAPILLARY: 167 mg/dL — AB (ref 70–99)
Glucose-Capillary: 151 mg/dL — ABNORMAL HIGH (ref 70–99)
Glucose-Capillary: 149 mg/dL — ABNORMAL HIGH (ref 70–99)
Glucose-Capillary: 189 mg/dL — ABNORMAL HIGH (ref 70–99)
Glucose-Capillary: 179 mg/dL — ABNORMAL HIGH (ref 70–99)

## 2018-07-12 LAB — CORTISOL: Cortisol, Plasma: 37.9 ug/dL

## 2018-07-12 LAB — MAGNESIUM: Magnesium: 1.7 mg/dL (ref 1.7–2.4)

## 2018-07-12 LAB — PHOSPHORUS: PHOSPHORUS: 3.4 mg/dL (ref 2.5–4.6)

## 2018-07-12 LAB — PROCALCITONIN: Procalcitonin: 21.93 ng/mL

## 2018-07-12 LAB — FIBRINOGEN: Fibrinogen: 168 mg/dL — ABNORMAL LOW (ref 210–475)

## 2018-07-12 LAB — PREPARE RBC (CROSSMATCH)

## 2018-07-12 SURGERY — LAPAROTOMY, EXPLORATORY
Anesthesia: General | Site: Abdomen

## 2018-07-12 MED ORDER — FENTANYL CITRATE (PF) 100 MCG/2ML IJ SOLN
INTRAMUSCULAR | Status: AC
  Administered 2018-07-12: 15:00:00
  Filled 2018-07-12: qty 2

## 2018-07-12 MED ORDER — SODIUM CHLORIDE 0.9% IV SOLUTION: Freq: Once | INTRAVENOUS | Status: DC

## 2018-07-12 MED ORDER — FLUCONAZOLE IN SODIUM CHLORIDE 200-0.9 MG/100ML-% IV SOLN
200.0000 mg | INTRAVENOUS | Status: DC
  Administered 2018-07-13 – 2018-07-14 (×2): 200 mg via INTRAVENOUS
  Filled 2018-07-12 (×2): qty 100

## 2018-07-12 MED ORDER — SODIUM CHLORIDE 0.9% IV SOLUTION
Freq: Once | INTRAVENOUS | Status: AC
  Administered 2018-07-13: via INTRAVENOUS

## 2018-07-12 MED ORDER — FENTANYL 2500MCG IN NS 250ML (10MCG/ML) PREMIX INFUSION: 25.0000 ug/h | INTRAVENOUS | Status: DC

## 2018-07-12 MED ORDER — ETOMIDATE 2 MG/ML IV SOLN
0.3000 mg/kg | Freq: Once | INTRAVENOUS | Status: AC
  Administered 2018-07-12: 20 mg via INTRAVENOUS

## 2018-07-12 MED ORDER — VANCOMYCIN HCL IN DEXTROSE 1-5 GM/200ML-% IV SOLN
1000.0000 mg | INTRAVENOUS | Status: DC
  Administered 2018-07-13 – 2018-07-14 (×2): 1000 mg via INTRAVENOUS
  Filled 2018-07-12 (×3): qty 200

## 2018-07-12 MED ORDER — SODIUM BICARBONATE 8.4 % IV SOLN
INTRAVENOUS | Status: DC | PRN
  Administered 2018-07-12 (×2): 50 meq via INTRAVENOUS

## 2018-07-12 MED ORDER — ALBUMIN HUMAN 5 % IV SOLN
INTRAVENOUS | Status: DC | PRN
  Administered 2018-07-12 (×3): via INTRAVENOUS

## 2018-07-12 MED ORDER — ACETAMINOPHEN 10 MG/ML IV SOLN
1000.0000 mg | Freq: Four times a day (QID) | INTRAVENOUS | Status: AC | PRN
  Filled 2018-07-12: qty 100

## 2018-07-12 MED ORDER — PROPOFOL 10 MG/ML IV BOLUS
INTRAVENOUS | Status: AC
  Filled 2018-07-12: qty 20

## 2018-07-12 MED ORDER — POTASSIUM CHLORIDE 10 MEQ/50ML IV SOLN
10.0000 meq | INTRAVENOUS | Status: AC
  Administered 2018-07-12 – 2018-07-13 (×4): 10 meq via INTRAVENOUS
  Filled 2018-07-12 (×4): qty 50

## 2018-07-12 MED ORDER — FAMOTIDINE IN NACL 20-0.9 MG/50ML-% IV SOLN
20.0000 mg | INTRAVENOUS | Status: DC
  Administered 2018-07-12 – 2018-07-15 (×4): 20 mg via INTRAVENOUS
  Filled 2018-07-12 (×4): qty 50

## 2018-07-12 MED ORDER — EPINEPHRINE PF 1 MG/10ML IJ SOSY
PREFILLED_SYRINGE | INTRAMUSCULAR | Status: DC | PRN
  Administered 2018-07-12 (×11): 100 ug via INTRAVENOUS

## 2018-07-12 MED ORDER — MIDAZOLAM HCL 2 MG/2ML IJ SOLN
INTRAMUSCULAR | Status: AC
  Filled 2018-07-12: qty 2

## 2018-07-12 MED ORDER — FREE WATER
350.0000 mL | Status: DC
  Administered 2018-07-12 (×2): 350 mL

## 2018-07-12 MED ORDER — PRO-STAT SUGAR FREE PO LIQD
30.0000 mL | Freq: Two times a day (BID) | ORAL | Status: DC
  Administered 2018-07-12: 30 mL via ORAL
  Filled 2018-07-12: qty 30

## 2018-07-12 MED ORDER — FENTANYL CITRATE (PF) 100 MCG/2ML IJ SOLN
25.0000 ug | Freq: Once | INTRAMUSCULAR | Status: AC
  Administered 2018-07-12: 25 ug via INTRAVENOUS
  Filled 2018-07-12: qty 2

## 2018-07-12 MED ORDER — SODIUM BICARBONATE 8.4 % IV SOLN
100.0000 meq | Freq: Once | INTRAVENOUS | Status: AC
  Administered 2018-07-12: 100 meq via INTRAVENOUS
  Filled 2018-07-12: qty 100

## 2018-07-12 MED ORDER — LACTATED RINGERS IV BOLUS
1000.0000 mL | Freq: Once | INTRAVENOUS | Status: AC
  Administered 2018-07-12: 1000 mL via INTRAVENOUS

## 2018-07-12 MED ORDER — SODIUM CHLORIDE 0.9 % IV SOLN
1.5000 g | Freq: Four times a day (QID) | INTRAVENOUS | Status: DC
  Administered 2018-07-12 (×2): 1.5 g via INTRAVENOUS
  Filled 2018-07-12 (×3): qty 1.5

## 2018-07-12 MED ORDER — ROCURONIUM BROMIDE 50 MG/5ML IV SOLN
1.0000 mg/kg | Freq: Once | INTRAVENOUS | Status: AC
  Administered 2018-07-12: 60 mg via INTRAVENOUS
  Filled 2018-07-12: qty 8.08

## 2018-07-12 MED ORDER — PIPERACILLIN-TAZOBACTAM 3.375 G IVPB 30 MIN
3.3750 g | INTRAVENOUS | Status: AC
  Administered 2018-07-12: 3.375 g via INTRAVENOUS
  Filled 2018-07-12: qty 50

## 2018-07-12 MED ORDER — LACTULOSE 10 GM/15ML PO SOLN
30.0000 g | Freq: Two times a day (BID) | ORAL | Status: DC
  Administered 2018-07-12: 30 g
  Filled 2018-07-12: qty 45

## 2018-07-12 MED ORDER — PANTOPRAZOLE SODIUM 40 MG PO PACK
40.0000 mg | PACK | Freq: Two times a day (BID) | ORAL | Status: DC
  Administered 2018-07-12: 40 mg
  Filled 2018-07-12: qty 20

## 2018-07-12 MED ORDER — FENTANYL CITRATE (PF) 250 MCG/5ML IJ SOLN
INTRAMUSCULAR | Status: AC
  Filled 2018-07-12: qty 5

## 2018-07-12 MED ORDER — VITAMIN K1 10 MG/ML IJ SOLN
10.0000 mg | Freq: Once | INTRAVENOUS | Status: AC
  Administered 2018-07-12: 10 mg via INTRAVENOUS
  Filled 2018-07-12: qty 1

## 2018-07-12 MED ORDER — EPINEPHRINE PF 1 MG/ML IJ SOLN
INTRAVENOUS | Status: DC | PRN
  Administered 2018-07-12: 5 ug/min via INTRAVENOUS

## 2018-07-12 MED ORDER — SODIUM BICARBONATE 8.4 % IV SOLN
INTRAVENOUS | Status: AC
  Filled 2018-07-12: qty 50

## 2018-07-12 MED ORDER — HYDROCORTISONE NA SUCCINATE PF 100 MG IJ SOLR
50.0000 mg | Freq: Four times a day (QID) | INTRAMUSCULAR | Status: DC
  Administered 2018-07-12 – 2018-07-16 (×15): 50 mg via INTRAVENOUS
  Filled 2018-07-12 (×15): qty 2

## 2018-07-12 MED ORDER — MAGNESIUM SULFATE 2 GM/50ML IV SOLN
2.0000 g | Freq: Once | INTRAVENOUS | Status: AC
  Administered 2018-07-12: 2 g via INTRAVENOUS
  Filled 2018-07-12: qty 50

## 2018-07-12 MED ORDER — FENTANYL CITRATE (PF) 100 MCG/2ML IJ SOLN: 50.0000 ug | Freq: Once | INTRAMUSCULAR | Status: DC

## 2018-07-12 MED ORDER — EPINEPHRINE PF 1 MG/ML IJ SOLN
0.5000 ug/min | INTRAMUSCULAR | Status: DC
  Administered 2018-07-12: 20 ug/min via INTRAVENOUS
  Filled 2018-07-12 (×2): qty 4

## 2018-07-12 MED ORDER — LACTATED RINGERS IV SOLN
INTRAVENOUS | Status: DC | PRN
  Administered 2018-07-12: 18:00:00 via INTRAVENOUS

## 2018-07-12 MED ORDER — VASOPRESSIN 20 UNIT/ML IV SOLN
0.0300 [IU]/min | INTRAVENOUS | Status: DC
  Administered 2018-07-12 – 2018-07-14 (×3): 0.03 [IU]/min via INTRAVENOUS
  Filled 2018-07-12 (×3): qty 2

## 2018-07-12 MED ORDER — BUSPIRONE HCL 15 MG PO TABS
15.0000 mg | ORAL_TABLET | Freq: Two times a day (BID) | ORAL | Status: DC
  Administered 2018-07-12: 15 mg via ORAL
  Filled 2018-07-12: qty 1

## 2018-07-12 MED ORDER — MIDAZOLAM HCL 2 MG/2ML IJ SOLN
INTRAMUSCULAR | Status: DC | PRN
  Administered 2018-07-12: 2 mg via INTRAVENOUS

## 2018-07-12 MED ORDER — STERILE WATER FOR INJECTION IV SOLN
INTRAVENOUS | Status: AC
  Administered 2018-07-12 – 2018-07-13 (×3): via INTRAVENOUS
  Filled 2018-07-12 (×6): qty 850

## 2018-07-12 MED ORDER — SODIUM CHLORIDE 0.9 % IV SOLN
1500.0000 mg | INTRAVENOUS | Status: AC
  Administered 2018-07-12: 1500 mg via INTRAVENOUS
  Filled 2018-07-12: qty 1500

## 2018-07-12 MED ORDER — POTASSIUM CHLORIDE 20 MEQ PO PACK
20.0000 meq | PACK | Freq: Once | ORAL | Status: AC
  Administered 2018-07-12: 20 meq via ORAL
  Filled 2018-07-12: qty 1

## 2018-07-12 MED ORDER — FENTANYL BOLUS VIA INFUSION
50.0000 ug | INTRAVENOUS | Status: DC | PRN
  Filled 2018-07-12: qty 50

## 2018-07-12 MED ORDER — ROCURONIUM BROMIDE 10 MG/ML (PF) SYRINGE
PREFILLED_SYRINGE | INTRAVENOUS | Status: DC | PRN
  Administered 2018-07-12: 50 mg via INTRAVENOUS

## 2018-07-12 MED ORDER — NOREPINEPHRINE 4 MG/250ML-% IV SOLN
0.0000 ug/min | INTRAVENOUS | Status: DC
  Administered 2018-07-12: 20 ug/min via INTRAVENOUS
  Administered 2018-07-12: 40 ug/min via INTRAVENOUS
  Filled 2018-07-12 (×2): qty 250

## 2018-07-12 MED ORDER — MIDAZOLAM HCL 2 MG/2ML IJ SOLN
INTRAMUSCULAR | Status: AC
  Administered 2018-07-12: 2 mg
  Filled 2018-07-12: qty 2

## 2018-07-12 MED ORDER — PHENYLEPHRINE HCL-NACL 10-0.9 MG/250ML-% IV SOLN
0.0000 ug/min | INTRAVENOUS | Status: DC
  Administered 2018-07-13: 20 ug/min via INTRAVENOUS
  Filled 2018-07-12 (×2): qty 250

## 2018-07-12 MED ORDER — MIDAZOLAM HCL 2 MG/2ML IJ SOLN: 2.0000 mg | INTRAMUSCULAR | Status: DC | PRN

## 2018-07-12 MED ORDER — DEXMEDETOMIDINE HCL IN NACL 400 MCG/100ML IV SOLN: 0.4000 ug/kg/h | INTRAVENOUS | Status: DC

## 2018-07-12 MED ORDER — LACTATED RINGERS IV BOLUS (SEPSIS)
1500.0000 mL | Freq: Once | INTRAVENOUS | Status: AC
  Administered 2018-07-12: 1500 mL via INTRAVENOUS

## 2018-07-12 MED ORDER — 0.9 % SODIUM CHLORIDE (POUR BTL) OPTIME
TOPICAL | Status: DC | PRN
  Administered 2018-07-12: 1000 mL
  Administered 2018-07-12 (×2): 2000 mL

## 2018-07-12 MED ORDER — FLUCONAZOLE IN SODIUM CHLORIDE 400-0.9 MG/200ML-% IV SOLN
400.0000 mg | Freq: Once | INTRAVENOUS | Status: AC
  Administered 2018-07-12: 400 mg via INTRAVENOUS
  Filled 2018-07-12: qty 200

## 2018-07-12 MED ORDER — NOREPINEPHRINE 16 MG/250ML-% IV SOLN
0.0000 ug/min | INTRAVENOUS | Status: DC
  Administered 2018-07-12: 10 ug/min via INTRAVENOUS
  Administered 2018-07-13: 40 ug/min via INTRAVENOUS
  Administered 2018-07-13: 35 ug/min via INTRAVENOUS
  Administered 2018-07-13 – 2018-07-14 (×2): 40 ug/min via INTRAVENOUS
  Administered 2018-07-15: 10 ug/min via INTRAVENOUS
  Filled 2018-07-12 (×6): qty 250

## 2018-07-12 MED ORDER — CALCIUM CHLORIDE 10 % IV SOLN
INTRAVENOUS | Status: DC | PRN
  Administered 2018-07-12: 1 g via INTRAVENOUS

## 2018-07-12 MED ORDER — LACTATED RINGERS IV SOLN
INTRAVENOUS | Status: DC | PRN
  Administered 2018-07-12: 19:00:00 via INTRAVENOUS

## 2018-07-12 MED ORDER — MIDAZOLAM HCL 2 MG/2ML IJ SOLN
2.0000 mg | INTRAMUSCULAR | Status: DC | PRN
  Administered 2018-07-13: 2 mg via INTRAVENOUS
  Filled 2018-07-12: qty 2

## 2018-07-12 MED ORDER — PIPERACILLIN-TAZOBACTAM 3.375 G IVPB
3.3750 g | Freq: Three times a day (TID) | INTRAVENOUS | Status: DC
  Administered 2018-07-13 – 2018-07-23 (×32): 3.375 g via INTRAVENOUS
  Filled 2018-07-12 (×33): qty 50

## 2018-07-12 MED ORDER — CALCIUM CHLORIDE 10 % IV SOLN
INTRAVENOUS | Status: AC
  Filled 2018-07-12: qty 10

## 2018-07-12 MED ORDER — FENTANYL CITRATE (PF) 100 MCG/2ML IJ SOLN
12.5000 ug | INTRAMUSCULAR | Status: DC | PRN
  Administered 2018-07-12: 12.5 ug via INTRAVENOUS
  Filled 2018-07-12: qty 2

## 2018-07-12 MED ORDER — SODIUM CHLORIDE 0.9% IV SOLUTION
Freq: Once | INTRAVENOUS | Status: AC
  Administered 2018-07-12: 21:00:00 via INTRAVENOUS

## 2018-07-12 MED ORDER — EPINEPHRINE PF 1 MG/10ML IJ SOSY
PREFILLED_SYRINGE | INTRAMUSCULAR | Status: AC
  Filled 2018-07-12: qty 10

## 2018-07-12 MED ORDER — SODIUM CHLORIDE 0.9 % IV BOLUS
500.0000 mL | Freq: Once | INTRAVENOUS | Status: AC
  Administered 2018-07-12: 500 mL via INTRAVENOUS

## 2018-07-12 MED ORDER — SODIUM CHLORIDE 0.9 % IV SOLN
INTRAVENOUS | Status: DC
Start: 2018-07-12 — End: 2018-07-12
  Administered 2018-07-12: 16:00:00 via INTRAVENOUS

## 2018-07-12 SURGICAL SUPPLY — 38 items
CANISTER SUCT 3000ML PPV (MISCELLANEOUS) ×2 IMPLANT
CANISTER WOUND CARE 500ML ATS (WOUND CARE) ×1 IMPLANT
CHLORAPREP W/TINT 26ML (MISCELLANEOUS) ×2 IMPLANT
COVER SURGICAL LIGHT HANDLE (MISCELLANEOUS) ×2 IMPLANT
DRAPE LAPAROSCOPIC ABDOMINAL (DRAPES) ×2 IMPLANT
DRAPE WARM FLUID 44X44 (DRAPE) ×2 IMPLANT
ELECT BLADE 6.5 EXT (BLADE) ×1 IMPLANT
ELECT CAUTERY BLADE 6.4 (BLADE) ×2 IMPLANT
ELECT REM PT RETURN 9FT ADLT (ELECTROSURGICAL) ×2
ELECTRODE REM PT RTRN 9FT ADLT (ELECTROSURGICAL) ×1 IMPLANT
GLOVE BIO SURGEON STRL SZ 6 (GLOVE) ×2 IMPLANT
GLOVE INDICATOR 6.5 STRL GRN (GLOVE) ×2 IMPLANT
GOWN STRL REUS W/ TWL LRG LVL3 (GOWN DISPOSABLE) ×1 IMPLANT
GOWN STRL REUS W/TWL 2XL LVL3 (GOWN DISPOSABLE) ×2 IMPLANT
GOWN STRL REUS W/TWL LRG LVL3 (GOWN DISPOSABLE) ×2
KIT BASIN OR (CUSTOM PROCEDURE TRAY) ×2 IMPLANT
KIT TURNOVER KIT B (KITS) ×2 IMPLANT
LIGASURE IMPACT 36 18CM CVD LR (INSTRUMENTS) ×1 IMPLANT
NS IRRIG 1000ML POUR BTL (IV SOLUTION) ×4 IMPLANT
PACK GENERAL/GYN (CUSTOM PROCEDURE TRAY) ×2 IMPLANT
PAD ARMBOARD 7.5X6 YLW CONV (MISCELLANEOUS) ×2 IMPLANT
RELOAD PROXIMATE 75MM BLUE (ENDOMECHANICALS) ×8 IMPLANT
RELOAD STAPLE 75 3.8 BLU REG (ENDOMECHANICALS) IMPLANT
SPECIMEN JAR LARGE (MISCELLANEOUS) IMPLANT
SPONGE ABD ABTHERA ADVANCE (MISCELLANEOUS) ×1 IMPLANT
SPONGE LAP 18X18 X RAY DECT (DISPOSABLE) ×2 IMPLANT
STAPLER PROXIMATE 75MM BLUE (STAPLE) ×1 IMPLANT
STAPLER VISISTAT 35W (STAPLE) ×2 IMPLANT
SUCTION POOLE TIP (SUCTIONS) ×2 IMPLANT
SUT SILK 2 0 SH CR/8 (SUTURE) ×1 IMPLANT
SUT SILK 3 0 SH CR/8 (SUTURE) ×1 IMPLANT
SUT VIC AB 3-0 SH 18 (SUTURE) ×1 IMPLANT
SUT VICRYL AB 2 0 TIES (SUTURE) ×2 IMPLANT
SUT VICRYL AB 3 0 TIES (SUTURE) ×2 IMPLANT
TOWEL OR 17X24 6PK STRL BLUE (TOWEL DISPOSABLE) ×2 IMPLANT
TOWEL OR 17X26 10 PK STRL BLUE (TOWEL DISPOSABLE) ×2 IMPLANT
TRAY FOLEY MTR SLVR 16FR STAT (SET/KITS/TRAYS/PACK) IMPLANT
YANKAUER SUCT BULB TIP NO VENT (SUCTIONS) IMPLANT

## 2018-07-12 NOTE — Code Documentation (Addendum)
CODE BLUE NOTE  Patient Name: Brandi [name]   MRN: [id number]   Date of Birth/ Sex: 1956/11/08 , female      Admission Date: 06/23/2018  Attending Provider: Marshell Garfinkel, MD  Primary Diagnosis: Atrial fibrillation with RVR ([place])    Indication: Pt was in her usual state of health until this PM, when she was noted to be without a pulse. Code blue was subsequently called. At the time of arrival on scene, ACLS protocol was underway.    Technical Description:  - CPR performance duration:  8 minutes  - Was defibrillation or cardioversion used? No   - Was external pacer placed? No  - Was patient intubated pre/post CPR? Yes    Medications Administered: Y = Yes; Blank = No Amiodarone    Atropine    Calcium    Epinephrine  Y  Lidocaine    Magnesium    Norepinephrine  Y  Phenylephrine    Sodium bicarbonate  Y  Vasopressin  Y    Post CPR evaluation:  - Final Status - Was patient successfully resuscitated ? Yes - What is current rhythm? Tachycardic - What is current hemodynamic status? Stable   Miscellaneous Information:  - Labs sent, including: CMP, CBC, Troponin, Lactic Aid  - Primary team notified?  Yes  - Family Notified? Yes  - Additional notes/ transfer status: None        Cleophas Dunker, DO  07/12/2018, 3:05 PM

## 2018-07-12 NOTE — Progress Notes (Signed)
Post intubation chart review > all oral medications discontinued given abdominal perforation.  Changed stress ulcer prophylaxis to IV.  GI notified of patients change in status.   Noe Gens, NP-C [facility] Pgr: [phone] or if no answer [phone] 07/12/2018, 4:49 PM

## 2018-07-12 NOTE — Progress Notes (Signed)
CRITICAL VALUE ALERT  Critical Value:  Troponin 0.03  Date & Time Notied:  07/12/18 1400  Provider Notified: Dr. Vaughan Browner  Orders Received/Actions taken: No new orders.  Irven Baltimore, RN

## 2018-07-12 NOTE — Progress Notes (Signed)
Overnight, patient became hypotensive in the setting of precedex. She also experienced coffee ground emesis. Patient continues to be very agitated with weaning of precedex. PCCM reconsulted.  Have started patient on Unasyn and have ordered repeat CBC. Will consult GI.   TRH will assume care when patient has been weaned off of precedex. Appreciated PCCM.

## 2018-07-12 NOTE — Procedures (Signed)
Intubation Procedure Note Brandi [name] [id number] 09/30/1956  Procedure: Intubation Indications: Respiratory insufficiency  Procedure Details Consent: Risks of procedure as well as the alternatives and risks of each were explained to the (patient/caregiver).  Consent for procedure obtained. Time Out: Verified patient identification, verified procedure, site/side was marked, verified correct patient position, special equipment/implants available, medications/allergies/relevent history reviewed, required imaging and test results available.  Performed  Maximum sterile technique was used including gown, hand hygiene and mask.  Glidescope, grade 1 view, 1 attempt  Evaluation Hemodynamic Status: BP stable throughout; O2 sats: stable throughout Patient's Current Condition: stable Complications: No apparent complications Patient did tolerate procedure well. Chest X-ray ordered to verify placement.  CXR: pending.   Brandi [name] 07/12/2018

## 2018-07-12 NOTE — Consult Note (Addendum)
Weekend coverage for Dr. Collene Mares and Benson Norway  Re-Consultation  Referring Provider: Dr. Ree Kida     Primary Care Physician:  Denita Lung, MD Primary Gastroenterologist:   Dr. Adriana Mccallum      Reason for Consultation:Coffee-ground emesis          HPI:   Brandi [name] is a 62 y.o. female with multiple medical issues listed below, admitted for generalized weakness with nausea, anorexia and shortness of breath with palpitations.  Drs. Collene Mares and [place] were consulted originally 06/26/2018.  Please see that note.    Hospital course-patient diagnosed with cirrhosis secondary to fatty liver noted on a CT scan of the abdomen and pelvis 06/17/2018, heterogenous liver enlargement with a prominent caudate lobe and fissural enlargement along with surface lobulation, complicated by the development of A. fib with RVR on admission started on diltiazem.  CT head negative for abnormalities.  Saw Dr. Benson Norway 06/26/2018 and was cleared for anticoagulation and started on lactulose.  06/26/2018 diagnosed with a right frontal particular stroke.  Acute encephalopathy thought due to [place].  Then transferred to ICU because of respiratory insufficiency and altered mental status requiring intubation.  Orogastric tube was placed and dark blood was noted in the tube.  Shock liver was suspected.  At that time lactulose was continued through the NG tube.  Upper GI bleed was thought possibly from a stress ulceration and patient was started on Protonix drip, INR at the time was greater than 8.  There is no indication for an EGD.  She was followed the next morning and critically ill.  Recent echo which showed an EF of 30-35% with marked elevation in BNP.  Her INR had come down from 7.8-3.37 and she was receiving vitamin K.  There was no overt evidence of bleeding at that time. Extubated 7/25.    We were re-consulted today as overnight patient became hypotensive in the setting of Precedex and was experiencing coffee-ground emesis.  It was noted she  continued to be very agitated with weaning of Precedex.  She was started on Unasyn and repeat CBC was ordered.    Today, nursing reports patient had coffee-ground emesis overnight with concern for aspiration, hypotension that responded to 500 and normal saline bolus x2.  Tube feeds were held overnight with nausea/abdominal pain.  NG tube placed overnight due to vomiting.  Patient is verbal at time of interview but very limited.  Apparently this is a great advancement from when she came in initially.  Tells me her abdomen hurts and that she is nauseous.  Tells me her belly has been hurting "the whole time she is been here".    Denies GERD.  Past Medical History:  Diagnosis Date  . Allergic rhinitis   . Anemia   . Arthritis HX RIGHT ANKLE FX  . Cancer [facility])    breast, lumpectomy  . Depression    MAJOR  . Dyslipidemia   . GERD (gastroesophageal reflux disease)   . History of breast cancer DX 2010--  S/P LUMPECTOMY AND RADIATION -- NO RECURRENCE   ONCOLOGIST- DR Truddie Coco  . Hypertension   . Left flank pain   . Left ureteral calculus   . Migraines   . Schizophrenic disorder ([place])   . 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 Type II or unspecified type diabetes mellitus without mention of complication, not stated as uncontrolled 03/16/2014    Past Surgical History:  Procedure Laterality Date  . BREAST SURGERY  01-04-2009  DR STRECK   LEFT BREAST LUMPECTOMY W/ SLN DISSECTION  FOR CANCER  . CATARACT EXTRACTION, BILATERAL Bilateral   . CESAREAN SECTION  1998  . CHILD BIRTH     C-SECTION  . CYSTOSCOPY WITH RETROGRADE PYELOGRAM, URETEROSCOPY AND STENT PLACEMENT  11/10/2012   Procedure: CYSTOSCOPY WITH RETROGRADE PYELOGRAM, URETEROSCOPY AND STENT PLACEMENT;  Surgeon: Hanley Ben, MD;  Location: [place];  Service: Urology;  Laterality: Left;  WITH DOUBLE J STENT  . HOLMIUM LASER APPLICATION  32/01/3342   Procedure: HOLMIUM LASER APPLICATION;   Surgeon: Hanley Ben, MD;  Location: [place];  Service: Urology;  Laterality: Left;  . LAPAROSCOPIC CHOLECYSTECTOMY  1990'S    Family History  Problem Relation Age of Onset  . Cancer Sister        breast  . Cancer Mother        breast  . Cancer Father        lung  . Mental illness Maternal Uncle   . Cancer Maternal Grandmother   . Stroke Maternal Grandmother   . Tuberculosis Maternal Grandfather   . Cancer Paternal Grandmother   . Seizures Neg Hx      Social History   Tobacco Use  . Smoking status: Never Smoker  . Smokeless tobacco: Never Used  Substance Use Topics  . Alcohol use: No  . Drug use: No    Prior to Admission medications   Medication Sig Start Date End Date Taking? Authorizing Provider  aspirin EC 81 MG EC tablet Take 1 tablet (81 mg total) by mouth daily. 12/09/16  Yes Mikhail, Velta Addison, DO  atorvastatin (LIPITOR) 20 MG tablet Take 1 tablet (20 mg total) by mouth daily. 01/26/18  Yes Denita Lung, MD  busPIRone (BUSPAR) 30 MG tablet Take 15-30 mg by mouth See admin instructions. 15 mg every morning, 30 mg every evening 11/01/14  Yes [provider]  calcium-vitamin D (OSCAL WITH D) 250-125 MG-UNIT per tablet Take 2 tablets by mouth daily. 05/18/14  Yes Causey, Charlestine Massed, NP  dicyclomine (BENTYL) 20 MG tablet Take 1 tablet (20 mg total) by mouth every 8 (eight) hours as needed for spasms. 06/20/18  Yes Long, Wonda Olds, MD  LATUDA 120 MG TABS Take 120 mg by mouth daily.  10/17/15  Yes [provider]  levETIRAcetam (KEPPRA) 500 MG tablet TAKE 2 TABLETS (1,000 MG TOTAL) BY MOUTH 2 (TWO) TIMES DAILY. 08/13/17  Yes Millikan, Jinny Blossom, NP  lisinopril (PRINIVIL,ZESTRIL) 5 MG tablet TAKE 1 TABLET (5 MG TOTAL) BY MOUTH DAILY. 01/26/18  Yes Denita Lung, MD  LORazepam (ATIVAN) 0.5 MG tablet Take 0.5-1 mg by mouth 2 (two) times daily. 1 tablet every morning, and 2 tablets at bedtime as needed for anxiety   Yes [provider]   omeprazole (PRILOSEC) 20 MG capsule Take 1 capsule (20 mg total) by mouth daily. 06/22/18  Yes Denita Lung, MD  ondansetron (ZOFRAN ODT) 4 MG disintegrating tablet Take 1 tablet (4 mg total) by mouth every 8 (eight) hours as needed for nausea or vomiting. 06/20/18  Yes Long, Wonda Olds, MD  ondansetron (ZOFRAN) 4 MG tablet Take 1 tablet (4 mg total) by mouth every 8 (eight) hours as needed for nausea or vomiting. 06/22/18  Yes Denita Lung, MD  venlafaxine XR (EFFEXOR-XR) 150 MG 24 hr capsule Take 300 mg by mouth daily.  10/16/15  Yes [provider]  VIMPAT 100 MG TABS TAKE 1 TABLET BY MOUTH TWICE A  DAY 01/07/18  Yes Kathrynn Ducking, MD  [facility] DELICA LANCETS FINE MISC DX: E11.9  Patient is to test one time a day 03/11/17   Denita Lung, MD    Current Facility-Administered Medications  Medication Dose Route Frequency Provider Last Rate Last Dose  . ampicillin-sulbactam (UNASYN) 1.5 g in sodium chloride 0.9 % 100 mL IVPB  1.5 g Intravenous Q6H Bryk, Veronda P, RPH 200 mL/hr at 07/12/18 0807 1.5 g at 07/12/18 0807  . ARIPiprazole (ABILIFY) tablet 2 mg  2 mg Oral Daily Hosie Poisson, MD   2 mg at 07/12/18 0949  . busPIRone (BUSPAR) tablet 15 mg  15 mg Oral BID Ollis, Brandi L, NP   15 mg at 07/12/18 0948  . chlorhexidine gluconate (MEDLINE KIT) (PERIDEX) 0.12 % solution 15 mL  15 mL Mouth Rinse BID Rush Farmer, MD   15 mL at 07/12/18 0809  . Chlorhexidine Gluconate Cloth 2 % PADS 6 each  6 each Topical Daily Marshell Garfinkel, MD   6 each at 07/12/18 0949  . dextrose 5 % solution   Intravenous Continuous Erick Colace, NP 50 mL/hr at 07/12/18 0700    . feeding supplement (PRO-STAT SUGAR FREE 64) liquid 30 mL  30 mL Oral BID Ollis, Brandi L, NP   30 mL at 07/12/18 0949  . feeding supplement (VITAL AF 1.2 CAL) liquid 1,000 mL  1,000 mL Per Tube Continuous Marshell Garfinkel, MD   Stopped at 07/11/18 1400  . free water 350 mL  350 mL Per Tube Q4H Ollis, Brandi L, NP      . ibuprofen  (ADVIL,MOTRIN) 100 MG/5ML suspension 400 mg  400 mg Oral Q6H PRN Mannam, Praveen, MD   400 mg at 07/11/18 1550  . insulin aspart (novoLOG) injection 0-9 Units  0-9 Units Subcutaneous Q4H Ollis, Brandi L, NP   1 Units at 07/12/18 0808  . lacosamide (VIMPAT) 100 mg in sodium chloride 0.9 % 25 mL IVPB  100 mg Intravenous Q12H Corey Harold, NP 70 mL/hr at 07/12/18 0948 100 mg at 07/12/18 0948  . lactulose (CHRONULAC) 10 GM/15ML solution 30 g  30 g Per Tube BID Ollis, Brandi L, NP   30 g at 07/12/18 0948  . levETIRAcetam (KEPPRA) IVPB 1000 mg/100 mL premix  1,000 mg Intravenous Q12H Corey Harold, NP   Stopped at 07/12/18 0016  . MEDLINE mouth rinse  15 mL Mouth Rinse 10 times per day Rush Farmer, MD   15 mL at 07/12/18 0949  . metoprolol tartrate (LOPRESSOR) 25 mg/10 mL oral suspension 25 mg  25 mg Per Tube BID Donita Brooks, NP   Stopped at 07/12/18 [phone]  . metoprolol tartrate (LOPRESSOR) injection 2.5-5 mg  2.5-5 mg Intravenous Q3H PRN Ollis, Brandi L, NP      . multivitamin liquid 15 mL  15 mL Oral Daily Mannam, Praveen, MD   15 mL at 07/12/18 0949  . ondansetron (ZOFRAN) tablet 4 mg  4 mg Oral Q6H PRN Patrecia Pour, MD       Or  . ondansetron [facility]) injection 4 mg  4 mg Intravenous Q6H PRN Patrecia Pour, MD   4 mg at 07/12/18 0958  . pantoprazole sodium (PROTONIX) 40 mg/20 mL oral suspension 40 mg  40 mg Per Tube BID Alfredo Martinez, Brandi L, NP   40 mg at 07/12/18 0948  . potassium chloride 20 MEQ/15ML (10%) solution 40 mEq  40 mEq Per Tube Daily Donita Brooks, NP  40 mEq at 07/12/18 0948  . rifaximin (XIFAXAN) tablet 550 mg  550 mg Per Tube TID Erick Colace, NP   550 mg at 07/12/18 0948  . sodium chloride flush (NS) 0.9 % injection 10-40 mL  10-40 mL Intracatheter Q12H Mannam, Praveen, MD   10 mL at 07/11/18 2134  . sodium chloride flush (NS) 0.9 % injection 10-40 mL  10-40 mL Intracatheter PRN Mannam, Hart Robinsons, MD        Allergies as of 06/22/2018 - Review Complete 06/22/2018    Allergen Reaction Noted  . Penicillins Rash 06/18/2011     Review of Systems:    Unable to complete given patient's mental status.   Physical Exam:  Vital signs in last 24 hours: Temp:  [97.5 F (36.4 C)-98.8 F (37.1 C)] 97.5 F (36.4 C) (07/28 0759) Pulse Rate:  [59-118] 100 (07/28 1000) Resp:  [17-31] 24 (07/28 1000) BP: (63-172)/(36-140) 105/59 (07/28 1000) SpO2:  [73 %-100 %] 99 % (07/28 0900) Weight:  [178 lb 2.1 oz (80.8 kg)] 178 lb 2.1 oz (80.8 kg) (07/28 0500) Last BM Date: 07/12/18 General:  Ill appearing Caucasian female appears to be chronically ill, agitated, mildly tachypneic Head:  Normocephalic and atraumatic. Eyes:   PEERL, EOMI. No icterus. Conjunctiva pink. Ears:  Normal auditory acuity. Neck:  Supple: Throat: Visbile coffee ground emesis on lips, NG tube with suction of black material and full canister in corner Lungs: Tachypneic. Lungs clear to auscultation bilaterally, good air entry, but low lung volumes, does not follow commands well..   No wheezes, crackles, or rhonchi.  Heart: Normal S1, S2. No MRG. Regular rate and rhythm. No peripheral edema, cyanosis or pallor.  Abdomen:  Soft, nondistended,Moderate epigastric ttp No rebound or guarding. Normal bowel sounds. No appreciable masses or hepatomegaly. Rectal:  Not performed.  Msk:  Symmetrical without gross deformities. Extremities:  Without edema, no deformity or joint abnormality.  Neurologic: She is disoriented moves all extremities well.  Protective is on her hands. Skin:   Dry, multiple ecchymoses Psychiatric: Limited verbally   LAB RESULTS: Recent Labs    07/10/18 0418 07/11/18 0533 07/12/18 0317  WBC 13.4* 10.3 18.6*  HGB 9.2* 7.7* 9.5*  HCT 33.0* 26.6* 32.9*  PLT PLATELET CLUMPS NOTED ON SMEAR, UNABLE TO ESTIMATE 88* 140*   BMET Recent Labs    07/10/18 0418 07/11/18 0533 07/12/18 0317  NA 156* 147* 140  K 3.0* 3.0* 3.6  CL 128* 121* 114*  CO2 22 21* 16*  GLUCOSE 158* 201*  208*  BUN 26* 22 30*  CREATININE 0.65 0.67 0.94  CALCIUM 8.2* 7.6* 7.8*   LFT Recent Labs    07/12/18 0317  PROT 4.3*  ALBUMIN 2.2*  AST 44*  ALT 89*  ALKPHOS 157*  BILITOT 3.8*   STUDIES: Dg Chest Port 1 View  Result Date: 07/12/2018 CLINICAL DATA:  Aspiration EXAM: PORTABLE CHEST 1 VIEW COMPARISON:  07/11/2018, 07/10/2018, 07/09/2018 FINDINGS: Feeding tube tip below the diaphragm but non included. Right central venous catheter tip overlies the cavoatrial region. Esophageal tube tip overlies the left lower lobe bronchial region. Cardiomegaly. Small pleural effusion and dense consolidation at the left base. No pneumothorax. IMPRESSION: 1. Newly inserted esophageal tube tip overlies left lower lobe bronchus. 2. Stable cardiomegaly, small left effusion and dense consolidation at the left base. Critical Value/emergent results were called by telephone at the time of interpretation on 07/12/2018 at 4:01 am to pt nurse Lexine Baton , who verbally acknowledged these results. Electronically Signed  By: Donavan Foil M.D.   On: 07/12/2018 04:01   Dg Chest Port 1 View  Result Date: 07/11/2018 CLINICAL DATA:  Hypoxia, generalized abdominal pain EXAM: PORTABLE CHEST 1 VIEW COMPARISON:  07/10/2018 FINDINGS: Right PICC line is in place. The tip is in the right atrium approximately 4 cm deep to the cavoatrial junction. Cardiomegaly with vascular congestion. Left lower lobe opacity, likely compressive atelectasis. No visible significant effusions. IMPRESSION: Right PICC line tip in the in right atrium 4 cm below the cavoatrial junction. Cardiomegaly with vascular congestion. Left lower lobe atelectasis. Electronically Signed   By: Rolm Baptise M.D.   On: 07/11/2018 16:53   Dg Abd Portable 1v  Result Date: 07/12/2018 CLINICAL DATA:  Nasogastric tube placement. EXAM: PORTABLE ABDOMEN - 1 VIEW COMPARISON:  Abdominal radiograph July 11, 2018 FINDINGS: Nasogastric tube tip projects in mid stomach. Feeding tube tip  projecting in third portion the duodenum. Paucity of bowel gas. No intra-abdominal mass effect or pathologic calcification. Surgical clips in the included right abdomen compatible with cholecystectomy. Soft tissue planes and included osseous structures are unchanged. IMPRESSION: Nasogastric tube tip projects in mid stomach. Feeding tube tip projecting in third portion of duodenum. Nonspecific bowel gas pattern. Electronically Signed   By: Elon Alas M.D.   On: 07/12/2018 05:05   Dg Abd Portable 1v  Result Date: 07/11/2018 CLINICAL DATA:  Hypoxia, generalized abdominal pain EXAM: PORTABLE ABDOMEN - 1 VIEW COMPARISON:  07/06/2018 FINDINGS: Feeding tube is in place with the tip in the distal duodenum. Mild gaseous distention of the stomach. Relative paucity of gas elsewhere throughout the abdomen. No organomegaly or free air. Prior cholecystectomy. IMPRESSION: Feeding tube tip in the distal duodenum. Mild gaseous distention of the stomach. Electronically Signed   By: Rolm Baptise M.D.   On: 07/11/2018 16:53   [place] Ekg Site Rite  Result Date: 07/10/2018 If Site Rite image not attached, placement could not be confirmed due to current cardiac rhythm.   Impression / Plan:   Impression: 1.  Coffee-ground emesis: Overnight 07/12/2018, hypotension which responded to saline, hgb 9.5 this morning, full canister of coffee ground emesis 2.  Acute respiratory insufficiency in the setting of liver failure: Unasyn started for possible aspiration overnight, tolerating room air 3.  Acute metabolic and hepatic encephalopathy/acute delirium 4.  Hypotension and new diagnosis A. fib with RVR: not on anticoagulation at the moment with concern for acute GI bleed 5.  Karlene Lineman cirrhosis with hepatic encephalopathy: 7/15 ultrasound liver with Doppler with no evidence of portal venous thrombosis, LFTs without significant change 6.  Anemia: hgb 9.1-->9.2-->7.7--9.5 7.  Thrombocytopenia: plt 140 today, Status post 3 units of  FFP 7/16 however most recent INR was on 7/22: 1.65  Plan: 1.  Agree with increase in PPI to twice daily 2. Will need to discuss possible EGD with Dr. Loletha Carrow  3. Ordered repeat hgb now 4. Continue NG tube to wall with intermittent suction 5. Please await any further recommendations from Dr. Loletha Carrow later today.  Thank you for your kind consultation, we will continue to follow.  Brandi [name]  07/12/2018, 10:01 AM Pager #: [phone]   I have discussed the case with the PA, and that is the plan I formulated. I personally interviewed and examined the patient.  I made a few changes to the physical exam findings from the PA's initial note this morning based on my evaluation.  This is a complicated and critically ill patient who has recurrent upper GI bleeding in the  setting of cirrhosis and portal hypertension, critical illness, acute respiratory failure and altered mental status.  Was extubated 3 days ago and remains critically ill but reportedly in the best condition she has yet been according to the critical care staff when we spoke today.  She most likely has gastritis/gastropathy from acute illness on top of portal hypertensive gastropathy.  Less likely scenario is a discrete upper GI source amenable to endoscopic intervention.  This is made more complicated by her thrombocytopenia and mild coagulopathy.  Last INR was checked on 722.  She does not appear to be having a brisk, heme dynamically significant upper GI bleed.  However, her hemoglobin has dropped significantly from yesterday.  Feel she will need an upper endoscopy tomorrow, or sooner if she develops massive upper GI bleeding.  I ordered a stat PT/INR, instructions that critical care or [facility] hospitalist team should be contacted if it is greater than or equal to 1.5 no FFP can be administered.  PPI is been increased to twice daily, NG tube will remain in 2 low remittent suction and nasoduodenal tube will remain in so she can  receive lactulose.  She will most likely have exacerbation of encephalopathy from upper GI bleeding, so lactulose is essential.  I will put orders in for an upper endoscopy tomorrow and signed the patient out to Dr. Collene Mares this evening.  Critical care team will call us later today or overnight as needed.  Brandi [name] Office: [phone]

## 2018-07-12 NOTE — Progress Notes (Signed)
Patient was intubated with CCM with 100 fent, 2 versed, 20 etomidate, and 60 rocuronium. 07/12/2018 Bobby Rumpf, Jeraldine Primeau E [phone]

## 2018-07-12 NOTE — Procedures (Signed)
Arterial Catheter Insertion Procedure Note ROSELAND BRAUN [id number] 1956-11-12  Procedure: Insertion of Arterial Catheter  Indications: Blood pressure monitoring  Procedure Details Consent: Unable to obtain consent because of emergent medical necessity. Time Out: Verified patient identification, verified procedure, site/side was marked, verified correct patient position, special equipment/implants available, medications/allergies/relevent history reviewed, required imaging and test results available.  Performed  Maximum sterile technique was used including antiseptics, cap, gloves, gown, hand hygiene, mask and sheet. Skin prep: Chlorhexidine; local anesthetic administered 20 gauge catheter was inserted into left radial artery using the Seldinger technique. ULTRASOUND GUIDANCE USED: NO Evaluation Blood flow good; BP tracing good. Complications: No apparent complications.   Mcneil Sober 07/12/2018

## 2018-07-12 NOTE — Procedures (Signed)
Intubation Procedure Note Brandi [name] [id number] October 06, 1956  Procedure: Intubation Indications: Airway protection and maintenance  Procedure Details Consent: Risks of procedure as well as the alternatives and risks of each were explained to the (patient/caregiver).  Consent for procedure obtained. and Unable to obtain consent because of altered level of consciousness. Time Out: Verified patient identification, verified procedure, site/side was marked, verified correct patient position, special equipment/implants available, medications/allergies/relevent history reviewed, required imaging and test results available.  Performed  Maximum sterile technique was used including gloves and hand hygiene.  MAC and 3    Evaluation Hemodynamic Status: Transient hypotension treated with pressors; O2 sats: stable throughout Patient's Current Condition: stable Complications: No apparent complications Patient did tolerate procedure well. Chest X-ray ordered to verify placement.  CXR: tube position acceptable.   Brandi [name] 07/12/2018

## 2018-07-12 NOTE — Progress Notes (Signed)
Pharmacy Antibiotic Note  Brandi [name] is a 62 y.o. female admitted on 06/23/2018 with nausea and poor oral intake, and now s/p code blue with concern for perforated bowel. Pharmacy consulted for vancomycin and Zosyn dosing, now additionally consulted for fluconazole dosing to cover possible fungemia.  The patient is noted to have fluctuating renal function this admit. Baseline appears to be <1, SCr noted to be rising and now up to 1.26. Current estimated CrCl ~45 mL/min.   Plan: Start fluconazole 400mg  IV x1 followed by 200mg  IV q24h Continue vancomycin 1g IV q24h Continue Zosyn 3.375g IV q8h (infused over 4 hours) F/u clinical status, C&S, de-escalation, LOT, vancomycin levels as indicated   Height: 5\' 2"  (157.5 cm) Weight: 178 lb 2.1 oz (80.8 kg) IBW/kg (Calculated) : 50.1  Temp (24hrs), Avg:98 F (36.7 C), Min:97.5 F (36.4 C), Max:98.6 F (37 C)  Recent Labs  Lab 07/10/18 0418 07/11/18 0533 07/12/18 0317 07/12/18 1012 07/12/18 1302 07/12/18 1506 07/12/18 1507  WBC 13.4* 10.3 18.6* 15.6*  --  4.4  --   CREATININE 0.65 0.67 0.94  --  1.04* 1.26*  --   LATICACIDVEN  --   --   --   --  5.8*  --  12.3*    Estimated Creatinine Clearance: 45.6 mL/min (A) (by C-G formula based on SCr of 1.26 mg/dL (H)).    No Known Allergies  Antimicrobials this admission: Zosyn 7/15 >> 7/22; restart 7/28 >> Vanc 7/15 >> 7/21; restart 7/28 >>  Unasyn 7/28 >> 7/28 Fluconazole 7/28 >>  Dose adjustments this admission: 7/16 VR = 10 (~ 24 hrs post 1 g) 7/19 VT = 15 on 1g q24  Microbiology results: 7/9 MRSA - negative 7/10 BCx - negative 7/15 BCx - negative 7/20 C.diff - negative 7/20 TA - negative  Thank you for allowing pharmacy to be a part of this patient's care.  Mila Merry Gerarda Fraction, PharmD PGY2 Infectious Diseases Pharmacy Resident Phone: [phone] 07/12/2018 7:53 PM

## 2018-07-12 NOTE — Anesthesia Postprocedure Evaluation (Signed)
Anesthesia Post Note  Patient: Brandi [name]  Procedure(s) Performed: EXPLORATORY LAPAROTOMY FOR FREE AIR (N/A Abdomen)     Patient location during evaluation: ICU Anesthesia Type: General Level of consciousness: sedated and patient remains intubated per anesthesia plan Pain management: pain level controlled Vital Signs Assessment: post-procedure vital signs reviewed and stable Respiratory status: patient remains intubated per anesthesia plan and patient on ventilator - see flowsheet for VS Cardiovascular status: stable Postop Assessment: no apparent nausea or vomiting Anesthetic complications: no    Last Vitals:  Vitals:   07/12/18 2030 07/12/18 2045  BP: 131/75 131/74  Pulse:  100  Resp: (!) 30 (!) 30  Temp:  (!) 35.6 C  SpO2: 100% 90%    Last Pain:  Vitals:   07/12/18 2045  TempSrc: Axillary  PainSc:                  Suresh Audi COKER

## 2018-07-12 NOTE — Transfer of Care (Signed)
Immediate Anesthesia Transfer of Care Note  Patient: Brandi [name]  Procedure(s) Performed: EXPLORATORY LAPAROTOMY FOR FREE AIR (N/A Abdomen)  Patient Location: ICU  Anesthesia Type:General  Level of Consciousness: Patient remains intubated per anesthesia plan  Airway & Oxygen Therapy: Patient remains intubated per anesthesia plan and Patient placed on Ventilator (see vital sign flow sheet for setting)  Post-op Assessment: Report given to RN and Post -op Vital signs reviewed and unstable, Anesthesiologist notified  Post vital signs: Reviewed and unstable  Last Vitals:  Vitals Value Taken Time  BP    Temp    Pulse    Resp    SpO2      Last Pain:  Vitals:   07/12/18 1436  TempSrc: Axillary  PainSc:       Patients Stated Pain Goal: 0 (43/88/87 [id number])  Complications: No apparent anesthesia complications

## 2018-07-12 NOTE — Progress Notes (Signed)
Called to bedside by RN to assess for tachypnea.  Patient states her abdomen is hurting.  She is awake/alert, appears uncomfortable but not in distress. No significant work of breathing.  Rate in the low to mid 20's on arrival to room.  Will attempt low dose fentanyl for pain and monitor closely.   Brother updated on plan of care.   Noe Gens, NP-C [facility] Pgr: [phone] or if no answer [phone] 07/12/2018, 11:50 AM

## 2018-07-12 NOTE — Op Note (Addendum)
PRE-OPERATIVE DIAGNOSIS: septic shock and perforated viscus  POST-OPERATIVE DIAGNOSIS:  Same, perforated descending colon and near perforation of cecum  PROCEDURE:  Procedure(s): Ileocecectomy and partial descending colectomy left in discontinuity with abdominal wound vac placement  SURGEON:  Surgeon(s): Stark Klein, MD  ANESTHESIA:   general  DRAINS: abdominal wound vac   LOCAL MEDICATIONS USED:  NONE  SPECIMEN:  Source of Specimen:  segment of descending colon and cecum  DISPOSITION OF SPECIMEN:  PATHOLOGY  COUNTS:  YES  DICTATION: .Dragon Dictation  PLAN OF CARE: back to ICU  PATIENT DISPOSITION:  ICU - intubated and critically ill.  FINDINGS:  Copious liquid stool throughout abdomen, perforation in mid descending colon.    EBL: < 100 mL  PROCEDURE:   Patient was identified in the ICU where she was intubated and critically ill.  She was brought downstairs directly to the operating room and placed supine on the operating room table.  General anesthesia was induced and appropriate monitoring was placed.  The patient's abdomen was prepped and draped in sterile fashion.  A timeout was performed according to the surgical safety checklist.  When all was correct, we continued.  A vertical midline incision was made with a #10 blade.  The subcutaneous tissues were divided with the cautery.  The fascia was entered and the midline sharply.  There was copious succus present in the abdomen.  This was suctioned from the abdomen.  There were multiple liters present.  The cecum was noted to be extremely distended and the tinea were stretched with near perforation, however the actual perforation was not present there.  The right colon was taken down at the white line of [place]t and examined more thoroughly.  The transverse colon was evaluated.  The descending colon was then evaluated and the perforation was located in the mid descending colon.  This was mobilized and the segment with the  perforation was removed with the GIA 75 mm staplers and the LigaSure.  Because of the near perforation and dilation of the cecum, the cecum was also resected.  The terminal ileum was divided with the GIA 75 mm stapler.  The distal ascending colon was divided with the GIA-75 stapler.  The LigaSure was used to take the mesocolon.  The abdomen was copiously irrigated.  A Prolene was placed at the distal portion of the ileum and tacked to the abdominal wall.  The distal descending colon and proximal descending colon were also marked with Prolenes.    The patient was markedly unstable throughout the case requiring boluses of epinephrine in addition to an epinephrine gtt and colloid boluses. Due to the instability of the patient, the abdominal wound vac was placed and secured with the drape.  The trac pad was placed and attached to the wound vac.  A good seal was obtained.  The count was correct.  The patient was transported back to the ICU in unstable condition.

## 2018-07-12 NOTE — Progress Notes (Signed)
Patient complaining of intense pain in abdomen, breathing is labored, extremely restless. Will page CCM. Modena Morrow E, RN 07/12/2018

## 2018-07-12 NOTE — Progress Notes (Signed)
PULMONARY / CRITICAL CARE MEDICINE   Name: Brandi [name] MRN: [id number] DOB: [birth year]    ADMISSION DATE:  06/23/2018 CONSULTATION DATE: 06/29/2018  REFERRING MD: Dr. Bonner Puna  CHIEF COMPLAINT: Altered mental status  HISTORY OF PRESENT ILLNESS:  62 y/o F who presented to [facility] on 7/9 with abdominal pain, nausea, vomiting, decreased oral intake and weakness.    She was reportedly evaluated by her PCP prior to admit with elevated LFT's (AST 302, ALT 611, AP 380).  Follow up CT of the abdomen demonstrated prominent caudate lobe and surface irregularity of the liver.   In the ER, US of the abdomen confirmed the CT findings of the liver.  She was noted to be lethargic and have asterixis on exam.  Ammonia 22 (7/10) > 38 (7/12).  The patient had AFwRVR and was treated with cardizem.  She was admitted for further evaluation.  The patient was seen by Cardiology and transitioned to metoprolol for rate control due to decreased LVEF (30-35%). Labs showed a microcytic anemia with ferritin of 9.  GI was consulted for evaluation with recommendations for lactulose and eventual endoscopic evaluation.  She had ongoing encephalopathy and a CT of the head was obtained which showed a right frontal operculum CVA.  Neurology evaluated the patient and felt the stroke was likely related to AF but not the cause of her mental status.  She was recommended anticoagulation and passed a swallow evaluation converting from heparin to eliquis.  On 7/15, her confusion, tardive dyskinesia symptoms and lethargy worsened as well as her LFT's (AST 3942, ALT 1976, ALK Phos 404), renal function and WBC.  PCCM consulted for evaluation.  Anticoagulation stopped due to shock liver.  PSY meds were held due to severe tardive dyskinesia and liver dysfunction.  She had prolonged intubation due to poor mental status.  The patient was extubated on 7/25 without difficulty.  She was transitioned to [facility] on 7/26.  She developed agitated delirium overnight  7/26 and precedex was initiated by ELink.  PCCM called back 7/28 to assess patient as she had coffee ground emesis overnight, concern for aspiration, hypotension that responded to 514m saline bolus x2.     SUBJECTIVE:  RN reports pt had coffee ground emesis overnight, concern for aspiration, hypotension that responded to 5031msaline bolus x2.  TF held overnight with nausea/abd pain.  NGT placed overnight in addition to cortrak due to vomiting.     VITAL SIGNS: BP 96/62   Pulse 73   Temp 97.8 F (36.6 C) (Oral)   Resp (!) 23   Ht '5\' 2"'  (1.575 m)   Wt 178 lb 2.1 oz (80.8 kg)   SpO2 100%   BMI 32.58 kg/m   HEMODYNAMICS:    VENTILATOR SETTINGS:    INTAKE / OUTPUT:  Intake/Output Summary (Last 24 hours) at 07/12/2018 0740 Last data filed at 07/12/2018 0700 Gross per 24 hour  Intake 5410.89 ml  Output 1700 ml  Net 3710.89 ml    PHYSICAL EXAMINATION: General:  Chronically ill appearing female in NAD HEENT: MM pink/dry, dried blood on tongue/lips Neuro: Much more alert, speaking clearly, MAE, generalized weakness  CV: s1s2 rrr, no m/r/g PULM: even/non-labored, lungs bilaterally clear anterior, diminished bases  GIBS:JGGEnon-tender, bsx4 active  Extremities: warm/dry, generalized anasarca   Skin: no rashes or lesions  LABS:  BMET Recent Labs  Lab 07/10/18 0418 07/11/18 0533 07/12/18 0317  NA 156* 147* 140  K 3.0* 3.0* 3.6  CL 128* 121* 114*  CO2 22  21* 16*  BUN 26* 22 30*  CREATININE 0.65 0.67 0.94  GLUCOSE 158* 201* 208*    Electrolytes Recent Labs  Lab 07/07/18 0420 07/08/18 0346  07/10/18 0418 07/11/18 0533 07/11/18 1330 07/12/18 0317  CALCIUM 8.2* 8.1*   < > 8.2* 7.6*  --  7.8*  MG 1.5* 2.2  --  1.8  --  1.5*  --   PHOS 2.7  --   --   --   --   --   --    < > = values in this interval not displayed.    CBC Recent Labs  Lab 07/10/18 0418 07/11/18 0533 07/12/18 0317  WBC 13.4* 10.3 18.6*  HGB 9.2* 7.7* 9.5*  HCT 33.0* 26.6* 32.9*  PLT  PLATELET CLUMPS NOTED ON SMEAR, UNABLE TO ESTIMATE 88* 140*    Coag's Recent Labs  Lab 07/06/18 0424  INR 1.65    Sepsis Markers Recent Labs  Lab 07/05/18 1151 07/06/18 0424 07/07/18 0420  PROCALCITON 2.03 1.85 1.17    ABG Recent Labs  Lab 07/07/18 0400 07/07/18 1434  PHART 7.516* 7.479*  PCO2ART 31.4* 31.4*  PO2ART 126* 121.0*    Liver Enzymes Recent Labs  Lab 07/07/18 0420 07/09/18 0413 07/12/18 0317  AST 41 56* 44*  ALT 164* 119* 89*  ALKPHOS 220* 199* 157*  BILITOT 4.7* 3.5* 3.8*  ALBUMIN 2.8* 2.4* 2.2*    Cardiac Enzymes No results for input(s): TROPONINI, PROBNP in the last 168 hours.  Glucose Recent Labs  Lab 07/11/18 0732 07/11/18 1134 07/11/18 1541 07/11/18 1938 07/11/18 2316 07/12/18 0339  GLUCAP 184* 142* 90 90 122* 151*    Imaging  STUDIES:  [place] ABD 7/6 >> possible cirrhosis, gallbladder surgically absent CT Head 7/9 >> atrophy with small vessel chronic ischemic changes of deep cerebral white matter CT Renal Study 7/9 >> no acute findings, bilateral non-obstructing renal stones ECHO 7/9 >> LV mildly dilated, wall thickness was increased in a pattern of moderate LVH, LVEF 30-35%, severe hypokinesis of the anteroseptal, anterior, inferoseptal & apical myocardium, mild to moderate MV regurgitation, LA severely dilated, RV moderately dilated, RA mod-severely dilated, PA pressure 58mHg, trivial pericardial effusion. CTA Head, Neck 7/12 >> minimal calcified plaque at the carotid bifurcation regions but no stenosis or irregularity, no large vessel occlusions, unable to specifically identify the missing branch vessel responsible for the right posterior frontal infarction but this is presumably an occluded M3 or distal branch CT Head w/o 7/12 >> the small right middle cerebral artery distribution, posterolateral frontal lobe infarct has evolved since the prior CT, it is now seen as a well defined area of hypoattenuation, no new areas of infarct, no  ICH CT head 7/15 >> Redemonstration of right posterior frontal lobe infarct. No acute intracranial hemorrhage or new infarct identified.  Atrophy with chronic small vessel ischemia. CT abd/pelvis 7/18 > Diffuse dilation of the colon with large amount of ascending colonic stool.   CULTURES: BCx2 7/10 >> negative  Cdiff 7/20 >> negative  Sputum 7/20 >> negative  ANTIBIOTICS: Vanco 7/15 >> 7/21 Zoysn 7/15 >> 7/22 Unasyn 7/28 >>  SIGNIFICANT EVENTS: 7/09  Admit  7/15  PCCM consulted with AMS, acute liver failure 7/19  Had been receiving fluid challenges for hypotension. 7/20   Early in the a.m. hours drop blood pressure.  Placed on pressors.  Non-anion gap acidosis noted.  Cortisol 29.2 CBC remains elevated blood cell count remains elevated.  7/21  no significant fever.  White cell count cut in  half.  Chest x-ray looks improved.  We resumed lactulose and rifaximin yesterday, also resumed spironolactone.  Still obtunded  7/22  Free water increased 7/23  800 ml in flexiseal overnight.  Off home PSY meds. Mg/K low.  Na 153 on free water. Continues to have tardive dyskinesia.  7/24 Tmax 100.2.  I/O- positive 8.6L for admit / 2125 of stool out 7/23. 7/26  extubated on 7/25. Remains encephalopathic. NA still elevated  7/28  PCCM called back for hypotension on precedex, coffee ground emesis  LINES/TUBES: ETT 7/15 >> 7/25  DISCUSSION: 62 y/o F admitted with weakness, decreased intake and lethargy.  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  She developed AMS > small infarct thought not related to AMS.  Recommended for anticoagulation by Cardiology/Neurology, was transitioned from heparin to eliquis.  The patient deteriorated on 7/15 with a rise in LFT's, AKI, confusion/AMS, worsening of tardive dyskinesia type symptoms.  Transferred to ICU.    ICU course significant for slow improvement of mental status (she actually looks the best I have seen her on 7/28).  She remains on room air  with no distress.  She has had periods of agitation which I suspect are related to being off her maintenance PSY regimen (due to liver dysfunction). She has been seen by PSY.  Her electrolytes have difficult to manage in the setting of high stool output & poor nutritional status. She has been on free water and dextrose via IV. She has pulled her flexiseal out multiple times.     ASSESSMENT / PLAN:  Resolved issues: AKI Acute respiratory failure in setting of neurological dysfunction  Ileus Volume overload  Bradycardia - into the 20's 7/23 am, on 36m BID of lopressor, dose reduced   Active issues   Acute Respiratory Insufficiency - in setting of liver failure  Bilateral Pleural Effusions - suspect in setting of liver disease, HF, AKI P: Pulmonary hygiene as able- mobilize, OOB to chair if able  O2 as needed to support sats > 90% Follow intermittent CXR Aspiration precautions Unasyn for possible aspiration PNA, 72/97 Acute metabolic & hepatic  encephalopathy/ Acute delirium CVA - Right Frontal Operculum infarct - felt related to AF/embolic but not all cause of mental status Right-sided weakness Seizure disorder Schizophrenia Tardive Dyskinesia - resolved 7/28 P: Increase Buspar to 15 mg BID Continue Abilify 215mQD Continue  PSY rec's > do nor resume homeeffexor, Abilify 74m40mD for mood  Reduce lactulose dose to 30 mg BID  Continue Vimpat, lactulose, keppra,rifaximin  Monitor for return of tardive dyskinesia with initiation of abilify, increase in buspar Safety sitter 7/28  HFrEF AF with RVR Hypotension - 7/28, suspect related to intravascular volume depletion, sedation HTN -cardiology saw[facility] P: Will need to consider timing of when to restart anticoagulation.  Hold for now with concerns for GIB. Continue lopressor 39m94mD + PRN.   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 If hypotension was a factor - 7/15 US l[place] w/doppler with no evidence of portal venous thrombosis  -LFTs without significant change P: Follow intermittent LFT's  Reduce lactulose to BID   Anemia  P: Trend CBC  No anticoagulation with concern for GIB  Leukocytosis, questionable sepsis however etiology unclear.   -Antibiotics were discontinued on 7/21.  No fever spikes.  White blood cell count continues to trend down P: Trend CBC  Monitor fever curve  Coagulopathy Thrombocytopenia - s/p 3 units FFP 7/16 am -Most recent INR was on 7/22: 1.65 P: Hold anticoagulation with bleeding risk, resolving hepatic dysfunction  Monitor for bleeding  Coffee Ground Emesis - new 7/28 Vomiting P: GI called back, appreciate input  Increase PPI to BID   DM 2 P: SSI   Hx Breast Cancer P: Outpatient follow up   DVT prophylaxis: SCD's SUP: PPI Diet: NPO Activity: BR Disposition : ICU  FAMILY  - Updates: No family available am 7/28.  Husband lives in [place] and typically visits late in the evening after work.     Suspect she will be able to transfer back to [facility] in am.  Will follow.    Noe Gens, NP-C [facility] Pgr: [phone] or if no answer [phone] 07/12/2018, 7:40 AM

## 2018-07-12 NOTE — Progress Notes (Signed)
SLP Cancellation Note  Patient Details Name: TELISSA PALMISANO MRN: [id number] DOB: [birth year]   Cancelled treatment:       Reason Eval/Treat Not Completed: Medical issues which prohibited therapy. Chart reviewed, note that GI consult is pending. Discussed with RN - pt is to be kept NPO. Will defer MBS for today. Will f/u for medical readiness.   Germain Osgood 07/12/2018, 8:19 AM  Germain Osgood, M.A. CCC-SLP [phone]

## 2018-07-12 NOTE — Anesthesia Procedure Notes (Addendum)
Central Venous Catheter Insertion Performed by: Roberts Gaudy, MD, anesthesiologist Start/End7/28/2019 6:05 PM, 07/12/2018 6:15 PM Patient location: OR. Preanesthetic checklist: patient identified, IV checked, site marked, risks and benefits discussed, surgical consent, monitors and equipment checked, pre-op evaluation, timeout performed and anesthesia consent Position: Trendelenburg Lidocaine 1% used for infiltration and patient sedated Hand hygiene performed , maximum sterile barriers used  and Seldinger technique used Catheter size: 12 Fr Total catheter length 16. Central line was placed.Triple lumen Procedure performed using ultrasound guided technique. Ultrasound Notes:anatomy identified, needle tip was noted to be adjacent to the nerve/plexus identified, no ultrasound evidence of intravascular and/or intraneural injection and image(s) printed for medical record Attempts: 1 Following insertion, dressing applied, line sutured and Biopatch. Post procedure assessment: blood return through all ports  Patient tolerated the procedure well with no immediate complications.

## 2018-07-12 NOTE — Progress Notes (Signed)
Given reported respiratory distress this afternoon, I have not scheduled an EGD with Drs. Mann/Hung for tomorrow on this patient. I will sign her out to Dr. Collene Mares this evening so she can be evaluated tomorrow and a decision made.

## 2018-07-12 NOTE — Progress Notes (Signed)
Subjective/Chief Complaint: Called for free air.  Pt has been here around 3 weeks with n/v/pain/atrial fibrillation/new dx nash, hepatic encephalopathy/ileus.  Was improving significantly until this afternoon when she started having tachypnea.  Ct was performed showing free air.  She has had dilated colon with stool earlier this hospitalization, but she was stooling, and distention was improving.  Her mental status was improving as well.  She had mildly elevated LFTs earlier, but then her t bili bumped over 2 and her transaminases bumped into the thousands.  She has new onset systolic heart failure and new atrial fibrillation as well.  She has no h/o ulcer disease of which her family is aware.     Objective: Vital signs in last 24 hours: Temp:  [97.5 F (36.4 C)-98.6 F (37 C)] 98.3 F (36.8 C) (07/28 1436) Pulse Rate:  [59-139] 108 (07/28 1533) Resp:  [16-38] 30 (07/28 1645) BP: (63-172)/(36-140) 109/62 (07/28 1533) SpO2:  [89 %-100 %] 99 % (07/28 1533) Arterial Line BP: (86-105)/(50-59) 95/50 (07/28 1645) FiO2 (%):  [100 %] 100 % (07/28 1534) Weight:  [80.8 kg (178 lb 2.1 oz)] 80.8 kg (178 lb 2.1 oz) (07/28 0500) Last BM Date: 07/12/18  Intake/Output from previous day: 07/27 0701 - 07/28 0700 In: 5410.9 [I.V.:1035.3; NG/GT:2310; IV Piggyback:2065.6] Out: 1700 [Urine:500; Emesis/NG output:600; Stool:600] Intake/Output this shift: Total I/O In: 4712 [I.V.:1062.9; NG/GT:990; IV Piggyback:2659] Out: 1020 [Urine:170; Emesis/NG output:850]  General appearance: sedated, intubated. Resp: CTAB Cardio: tachy, regular GI: soft, distended.  not firm.   Extremities: + edema, warm  Lab Results:  Recent Labs    07/12/18 1012 07/12/18 1302 07/12/18 1506  WBC 15.6*  --  4.4  HGB 7.7* 9.1* 7.7*  HCT 26.1* 32.3* 27.1*  PLT 112*  --  82*   BMET Recent Labs    07/12/18 1302 07/12/18 1506  NA 142 150*  K 5.5* 4.9  CL 117* 116*  CO2 14* 16*  GLUCOSE 223* 206*  BUN 37* 38*   CREATININE 1.04* 1.26*  CALCIUM 7.7* 7.6*   PT/INR Recent Labs    07/12/18 1106  LABPROT 19.3*  INR 1.64   ABG Recent Labs    07/12/18 1610  PHART 7.230*  HCO3 14.6*    Studies/Results: Ct Abdomen Pelvis Wo Contrast  Result Date: 07/12/2018 CLINICAL DATA:  Coffee ground emesis. Concern for aspiration. Nausea and abdominal pain. EXAM: CT ABDOMEN AND PELVIS WITHOUT CONTRAST TECHNIQUE: Multidetector CT imaging of the abdomen and pelvis was performed following the standard protocol without IV contrast. COMPARISON:  July 02, 2018. FINDINGS: Lower chest: Bilateral pleural effusions with underlying opacities, possibly atelectasis, are similar since the July 02, 2018 study. Lung bases are otherwise unchanged. Hepatobiliary: Previous cholecystectomy. The liver is otherwise unremarkable. Pancreas: Unremarkable. No pancreatic ductal dilatation or surrounding inflammatory changes. Spleen: Normal in size without focal abnormality. Adrenals/Urinary Tract: Nonobstructive stones are seen in both kidneys. No ureterectasis. The bladder is decompressed with a Foley catheter. Stomach/Bowel: A feeding tube terminates in the proximal jejunum. An NG tube terminates in the distal stomach. The stomach, small bowel, and colon are poorly assessed without contrast. The stomach is grossly unremarkable. There is no small bowel obstruction. No definitive abnormal loops of small bowel. There is a tube in the rectum. There is fluid in the rectum. No definitive colonic abnormalities noted. The appendix is not seen but there is no secondary evidence of appendicitis. Vascular/Lymphatic: Minimal atherosclerotic changes in the nonaneurysmal aorta. No adenopathy. Reproductive: Uterus and bilateral adnexa are unremarkable.  Other: There is free air in the abdomen. The source of free air is unclear. There is also ascites in the abdomen, particularly around the liver and in the pelvis, similar in the interval. Musculoskeletal: No acute  or significant osseous findings. IMPRESSION: 1. There is free air in the abdomen consistent bowel rupture. A specific source is not identified. 2. Bilateral pleural effusions with underlying opacities are stable since July 02, 2018. The underlying opacity is likely atelectasis. 3. Nonobstructive stones in the kidneys. 4. Atherosclerotic changes in the nonaneurysmal aorta. Findings discussed with the patient's nurse, April. The ICU physician was in the room at the time of the discussion and is aware. Electronically Signed   By: Dorise Bullion III M.D   On: 07/12/2018 14:47   Dg Chest Port 1 View  Result Date: 07/12/2018 CLINICAL DATA:  Check endotracheal tube placement EXAM: PORTABLE CHEST 1 VIEW COMPARISON:  Film from earlier in the same day FINDINGS: Endotracheal tube is been withdrawn and now lies 1 cm above the carina. PICC line, feeding catheter and nasogastric catheter are noted in satisfactory position. Cardiac shadow is stable. Elevation of the right hemidiaphragm is again seen. No new focal abnormality is noted. IMPRESSION: Interval repositioning of the endotracheal tube into the distal trachea. No new focal abnormality is noted. Electronically Signed   By: Inez Catalina M.D.   On: 07/12/2018 15:55   Dg Chest Portable 1 View  Result Date: 07/12/2018 CLINICAL DATA:  Check nasogastric catheter placement EXAM: PORTABLE CHEST 1 VIEW COMPARISON:  07/12/2018 FINDINGS: Nasogastric catheter has been withdrawn and readvanced into the stomach. Feeding catheter is again noted within the stomach. The endotracheal tube is seen and lies within the right mainstem bronchus and should be withdrawn 2-3 cm. Right-sided PICC line is noted in satisfactory position. Some mild increased density in the left lung is noted likely related to incomplete expansion due to the right mainstem intubation. IMPRESSION: Endotracheal tube within the right mainstem bronchus. Some associated increased density within the under expanded  left lung is noted. Nasogastric catheter has been repositioned into the stomach. Electronically Signed   By: Inez Catalina M.D.   On: 07/12/2018 15:52   Dg Chest Port 1 View  Result Date: 07/12/2018 CLINICAL DATA:  Aspiration EXAM: PORTABLE CHEST 1 VIEW COMPARISON:  07/11/2018, 07/10/2018, 07/09/2018 FINDINGS: Feeding tube tip below the diaphragm but non included. Right central venous catheter tip overlies the cavoatrial region. Esophageal tube tip overlies the left lower lobe bronchial region. Cardiomegaly. Small pleural effusion and dense consolidation at the left base. No pneumothorax. IMPRESSION: 1. Newly inserted esophageal tube tip overlies left lower lobe bronchus. 2. Stable cardiomegaly, small left effusion and dense consolidation at the left base. Critical Value/emergent results were called by telephone at the time of interpretation on 07/12/2018 at 4:01 am to pt nurse Lexine Baton , who verbally acknowledged these results. Electronically Signed   By: Donavan Foil M.D.   On: 07/12/2018 04:01   Dg Chest Port 1 View  Result Date: 07/11/2018 CLINICAL DATA:  Hypoxia, generalized abdominal pain EXAM: PORTABLE CHEST 1 VIEW COMPARISON:  07/10/2018 FINDINGS: Right PICC line is in place. The tip is in the right atrium approximately 4 cm deep to the cavoatrial junction. Cardiomegaly with vascular congestion. Left lower lobe opacity, likely compressive atelectasis. No visible significant effusions. IMPRESSION: Right PICC line tip in the in right atrium 4 cm below the cavoatrial junction. Cardiomegaly with vascular congestion. Left lower lobe atelectasis. Electronically Signed   By: Rolm Baptise  M.D.   On: 07/11/2018 16:53   Dg Abd Portable 1v  Result Date: 07/12/2018 CLINICAL DATA:  Nasogastric tube placement. EXAM: PORTABLE ABDOMEN - 1 VIEW COMPARISON:  Abdominal radiograph July 11, 2018 FINDINGS: Nasogastric tube tip projects in mid stomach. Feeding tube tip projecting in third portion the duodenum. Paucity of  bowel gas. No intra-abdominal mass effect or pathologic calcification. Surgical clips in the included right abdomen compatible with cholecystectomy. Soft tissue planes and included osseous structures are unchanged. IMPRESSION: Nasogastric tube tip projects in mid stomach. Feeding tube tip projecting in third portion of duodenum. Nonspecific bowel gas pattern. Electronically Signed   By: Elon Alas M.D.   On: 07/12/2018 05:05   Dg Abd Portable 1v  Result Date: 07/11/2018 CLINICAL DATA:  Hypoxia, generalized abdominal pain EXAM: PORTABLE ABDOMEN - 1 VIEW COMPARISON:  07/06/2018 FINDINGS: Feeding tube is in place with the tip in the distal duodenum. Mild gaseous distention of the stomach. Relative paucity of gas elsewhere throughout the abdomen. No organomegaly or free air. Prior cholecystectomy. IMPRESSION: Feeding tube tip in the distal duodenum. Mild gaseous distention of the stomach. Electronically Signed   By: Rolm Baptise M.D.   On: 07/11/2018 16:53    Anti-infectives: Anti-infectives (From admission, onward)   Start     Dose/Rate Route Frequency Ordered Stop   07/13/18 1800  vancomycin (VANCOCIN) IVPB 1000 mg/200 mL premix     1,000 mg 200 mL/hr over 60 Minutes Intravenous Every 24 hours 07/12/18 1549     07/13/18 0100  piperacillin-tazobactam (ZOSYN) IVPB 3.375 g     3.375 g 12.5 mL/hr over 240 Minutes Intravenous Every 8 hours 07/12/18 1549     07/12/18 1600  vancomycin (VANCOCIN) 1,500 mg in sodium chloride 0.9 % 500 mL IVPB     1,500 mg 250 mL/hr over 120 Minutes Intravenous STAT 07/12/18 1549 07/13/18 1600   07/12/18 1600  piperacillin-tazobactam (ZOSYN) IVPB 3.375 g     3.375 g 100 mL/hr over 30 Minutes Intravenous STAT 07/12/18 1549 07/12/18 1701   07/12/18 0800  ampicillin-sulbactam (UNASYN) 1.5 g in sodium chloride 0.9 % 100 mL IVPB  Status:  Discontinued     1.5 g 200 mL/hr over 30 Minutes Intravenous Every 6 hours 07/12/18 0715 07/12/18 1538   07/04/18 1000  rifaximin  (XIFAXAN) tablet 550 mg  Status:  Discontinued     550 mg Per Tube 3 times daily 07/04/18 0925 07/12/18 1647   06/30/18 2200  vancomycin (VANCOCIN) IVPB 1000 mg/200 mL premix  Status:  Discontinued     1,000 mg 200 mL/hr over 60 Minutes Intravenous Every 24 hours 06/30/18 2008 07/04/18 0925   06/30/18 2200  piperacillin-tazobactam (ZOSYN) IVPB 3.375 g  Status:  Discontinued     3.375 g 12.5 mL/hr over 240 Minutes Intravenous Every 8 hours 06/30/18 2011 07/06/18 1149   06/30/18 0000  vancomycin variable dose per unstable renal function (pharmacist dosing)  Status:  Discontinued      Does not apply See admin instructions 06/29/18 1229 07/03/18 0747   06/29/18 1300  piperacillin-tazobactam (ZOSYN) IVPB 2.25 g  Status:  Discontinued     2.25 g 100 mL/hr over 30 Minutes Intravenous Every 8 hours 06/29/18 1229 06/30/18 2011   06/29/18 1300  vancomycin (VANCOCIN) 1,250 mg in sodium chloride 0.9 % 250 mL IVPB     1,250 mg 166.7 mL/hr over 90 Minutes Intravenous  Once 06/29/18 1229 06/29/18 2234      Assessment/Plan: s/p * No surgery found * perforated viscus  Septic shock S/p Cardiac arrest Acute liver failure Mild coagulopathy  Plan ex lap and repair/resection of perforated viscus.  Discussed with husband, daughter, and brother risks of surgery including further leak/infection, possible ostomy, possible additional surgeries/drains/procedures.  I discussed that her prognosis is bad with or without surgery, but she would certainly die without surgery.   I don't think that this is the cause of her previous issues such as new cirrhosis, encephalopathy or other cardiac issues.  I discussed that if she does get better, it will be a long slow road and that these other issues will still require management.  She has already had cholecystectomy. 25 min spent in discussion with patient's family.  45 min spent with exam, counseling, and coordination of care.     LOS: 19 days    Stark Klein 07/12/2018

## 2018-07-12 NOTE — Progress Notes (Signed)
eLink Physician-Brief Progress Note Patient Name: Brandi [name] DOB: [birth year] MRN: [id number]   Date of Service  07/12/2018  HPI/Events of Note  Lactic Acid level = 12.3 --> 8.4. Lactic acid appears to be clearing.   eICU Interventions  Continue to trend lactic acid.      Intervention Category Major Interventions: Acid-Base disturbance - evaluation and management  Sommer,Steven Eugene 07/12/2018, 9:32 PM

## 2018-07-12 NOTE — Progress Notes (Signed)
Called emergently to bedside to evaluate patient, upon her returning from the OR, as she is hemodynamically unstable. Brief chart review shows her to be a 71yoF with hx NASH cirrhosis, Schizophrenia, DM, Afib, SZ, Breast cancer, GERD, admitted 06/23/18 with weakness, N/V and found to be in Afib RVR with Transaminitis. TTE revealed EF 30%. Patient found to be anemic. 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 She developed AKI and increasing lethargy. PCCM consulted and patient intubated. Patient had prolonged intubation due to poor mental status, finally extubated 7/25. Patient had UGIB, Hypotension, and concern for aspiration on 7/28. GI reconsulted. Concern for airway protection given the coffee ground emesis and aspiration, therefore patient was intubated. Following intubation, patient developed cardiac arrest requiring 8 minutes CPR prior to attaining ROSC. CT Abdomen revealed free air consistent with a perforation. Patient taken emergently to the OR where ex-lap performed revealing perforation in mid-descending colon with multiple liters of free fluid suctioned and removed. Patient markedly unstable throughout the procedure, requiring multiple vasopressor infusions as well as intermittent boluses of epinephrine. Patient return to the ICU critically ill.   On my exam, she is intubated, unresponsive (not on sedation), very pale, pupils pinpoint. RIJ TLC and RUE double lumen PICC present, as well as left radial Aline and Foley. Lungs CTA b/l anteriorly. Abdomen left open with wound vac in place; already 350cc of bloody fluid (bright cherry red, not frank blood), returned from wound vac.  Vent: PRVC 60% FIO2, 5 PEEP, R 30, Vt 460 Pressors: Epi @ 26, Levo @ 20, Vaso 0.03 LR bolus infusing BP 125/60, HR 79, Pox not picking up  ISTAT labs reveal: 07/09/37/66/17.2/90%, for which will give 2 amps bicarb IV, and increase FIO2 from 60% to 70%. Start bicarb gtt Na 145, K 3.7, Cl 109,  Bicarb 18, BUN 47, Cr 0.9, Gluc 186, Hgb 5.4 Will order 3 unites pRBC stat. Repeat Hgb following transfusions and thereafter q4 hrs.   Continue Vanc and Zosyn; add IV Flucon. Panculture. Trend lactate, Check cortisol and procal. Start hydrocortisone empirically given the requirement of 3 vasopressors. Monitor CVP.   CMP, Mg, Phos, CBC, INR, Fibrinogen, Trop, Lactate, Procal, Cortisol sent to lab and are pending.   Patient remains critically ill with multisystem organ failure and high chance of further decompensation and death. I updated her family members.   60 minutes nonprocedural critical care time  Vernie Murders, MD Pulmonary & Critical Care Medicine Pager: [phone]

## 2018-07-12 NOTE — Progress Notes (Signed)
Pharmacy Antibiotic Note  Brandi [name] is a 62 y.o. female admitted on 06/23/2018 with nausea and poor oral intake and now s/p code blue with concern for perforated bowel.  Pharmacy has been consulted for Vancomycin and Zosyn dosing.  The patient is noted to have fluctuating renal function this admit. Baseline appears to be <1, SCr noted to be currently rising and up to 1.04 << 0.67. Would expect an addition rise after the code this afternoon. Will dose Vancomycin conservatively.   Plan: - Vancomycin 1500 mg IV x 1 followed by 1g IV every 24 hours - Zosyn 3.375g IV every 8 hours (infused over 4 hours) - Will continue to follow renal function, culture results, LOT, and antibiotic de-escalation plans   Height: 5\' 2"  (157.5 cm) Weight: 178 lb 2.1 oz (80.8 kg) IBW/kg (Calculated) : 50.1  Temp (24hrs), Avg:98.2 F (36.8 C), Min:97.5 F (36.4 C), Max:98.8 F (37.1 C)  Recent Labs  Lab 07/09/18 0413 07/10/18 0418 07/11/18 0533 07/12/18 0317 07/12/18 1012 07/12/18 1302 07/12/18 1506  WBC  --  13.4* 10.3 18.6* 15.6*  --  4.4  CREATININE 0.65 0.65 0.67 0.94  --  1.04*  --   LATICACIDVEN  --   --   --   --   --  5.8*  --     Estimated Creatinine Clearance: 55.3 mL/min (A) (by C-G formula based on SCr of 1.04 mg/dL (H)).    No Known Allergies  Antimicrobials this admission: Zosyn 7/15 >> 7/22; restart 7/28 >> Vanc 7/15 >> 7/21; restart 7/28 >>  Unasyn 7/28 >> 7/28  Dose adjustments this admission: 7/16 VR = 10 (~ 24 hrs post 1 g) 7/19 VT = 15 on 1g q24  Microbiology results: 7/9 MRSA - negative 7/10 BCx - negative 7/15 BCx - negative 7/20 C.diff - negative 7/20 TA - negative  Thank you for allowing pharmacy to be a part of this patient's care.  Alycia Rossetti, PharmD, BCPS Clinical Pharmacist Pager: [phone] Please check AMION for all [place] numbers 07/12/2018 3:51 PM

## 2018-07-12 NOTE — Progress Notes (Signed)
Patient lost pulse after intubation, ROSC 9 minutes, levo started, continue to monitor. Bartholomew Crews, RN 07/12/2018 3:06 PM

## 2018-07-12 NOTE — Progress Notes (Signed)
Reviewed lab results. INR 2.16 and Fibrinogen low at 168, concern may be going into DIC. Give 2u FFP now. Repeat INR and Fibrinogen daily.   Hgb 4.8 already in process of transfusing. Recheck following transfusions  Lactate 8.4 down from 12.3; continue trending  K 3.4 and Mg 1.7; replete with KCL 40MEQ IV and 2g Magnesium sulfate IV.

## 2018-07-12 NOTE — Progress Notes (Signed)
Pharmacy Antibiotic Note  Brandi [name] is a 62 y.o. female admitted on 06/23/2018, now w/ concern for aspiration pneumonia.  Pharmacy has been consulted for Unasyn dosing.  Plan: Unasyn 1.5g IV Q6H.  Height: 5\' 2"  (157.5 cm) Weight: 178 lb 2.1 oz (80.8 kg) IBW/kg (Calculated) : 50.1  Temp (24hrs), Avg:97.9 F (36.6 C), Min:97.3 F (36.3 C), Max:98.8 F (37.1 C)  Recent Labs  Lab 07/08/18 0346 07/08/18 0920 07/08/18 2158 07/09/18 0413 07/10/18 0418 07/11/18 0533 07/12/18 0317  WBC 15.4* 17.3*  --   --  13.4* 10.3 18.6*  CREATININE 0.85  --  0.72 0.65 0.65 0.67 0.94    Estimated Creatinine Clearance: 61.1 mL/min (by C-G formula based on SCr of 0.94 mg/dL).    No Known Allergies  Antimicrobials this admission: Zosyn 7/15 >> 7/22 Vanc 7/15 >> 7/21 Unasyn 7/28 >>   Microbiology results: 7/9 MRSA - negative 7/10 BCx - negative 7/15 BCx - negative 7/20 C.diff - negative 7/20 TA - negative  Thank you for allowing pharmacy to be a part of this patient's care.  Wynona Neat, PharmD, BCPS  07/12/2018 7:09 AM

## 2018-07-12 NOTE — Progress Notes (Signed)
Patient is still complaining of intense abdomen pain, 12.5 of fentanyl did not relieve any pain. CCM called about pain and increased work of breathing. 25 mcg of fentanyl ordered. CT abdomen without contrast ordered STAT. Modena Morrow E, RN 07/12/2018 1111

## 2018-07-12 NOTE — Anesthesia Preprocedure Evaluation (Signed)
Anesthesia Evaluation  Patient identified by MRN, date of birth, ID band Patient unresponsive    Reviewed: Allergy & Precautions, Patient's Chart, lab work & pertinent test results  Airway Mallampati: Intubated       Dental   Pulmonary     + decreased breath sounds      Cardiovascular hypertension,  Rhythm:Irregular Rate:Tachycardia     Neuro/Psych    GI/Hepatic   Endo/Other  diabetes  Renal/GU      Musculoskeletal   Abdominal (+)  Abdomen: rigid.    Peds  Hematology   Anesthesia Other Findings   Reproductive/Obstetrics                             Anesthesia Physical Anesthesia Plan  ASA: IV and emergent  Anesthesia Plan: General   Post-op Pain Management:    Induction: Intravenous  PONV Risk Score and Plan:   Airway Management Planned: Oral ETT  Additional Equipment: Arterial line, CVP and Ultrasound Guidance Line Placement  Intra-op Plan:   Post-operative Plan: Post-operative intubation/ventilation  Informed Consent: I have reviewed the patients History and Physical, chart, labs and discussed the procedure including the risks, benefits and alternatives for the proposed anesthesia with the patient or authorized representative who has indicated his/her understanding and acceptance.     Plan Discussed with: CRNA and Anesthesiologist  Anesthesia Plan Comments:         Anesthesia Quick Evaluation

## 2018-07-13 ENCOUNTER — Encounter (HOSPITAL_COMMUNITY): Payer: Self-pay | Admitting: General Surgery

## 2018-07-13 ENCOUNTER — Inpatient Hospital Stay (HOSPITAL_COMMUNITY): Payer: Medicare Other

## 2018-07-13 DIAGNOSIS — K631 Perforation of intestine (nontraumatic): Secondary | ICD-10-CM

## 2018-07-13 DIAGNOSIS — R198 Other specified symptoms and signs involving the digestive system and abdomen: Secondary | ICD-10-CM

## 2018-07-13 LAB — BLOOD GAS, ARTERIAL
Acid-base deficit: 1.1 mmol/L (ref 0.0–2.0)
BICARBONATE: 22.1 mmol/L (ref 20.0–28.0)
Drawn by: 39899
FIO2: 50
MECHVT: 460 mL
O2 Saturation: 97.9 %
PATIENT TEMPERATURE: 98.9
PEEP/CPAP: 5 cmH2O
PO2 ART: 101 mmHg (ref 83.0–108.0)
RATE: 30 resp/min
pCO2 arterial: 30.7 mmHg — ABNORMAL LOW (ref 32.0–48.0)
pH, Arterial: 7.471 — ABNORMAL HIGH (ref 7.350–7.450)

## 2018-07-13 LAB — COMPREHENSIVE METABOLIC PANEL
ALBUMIN: 2.4 g/dL — AB (ref 3.5–5.0)
ALK PHOS: 72 U/L (ref 38–126)
ALT: 75 U/L — ABNORMAL HIGH (ref 0–44)
ALT: 88 U/L — ABNORMAL HIGH (ref 0–44)
AST: 155 U/L — AB (ref 15–41)
AST: 103 U/L — ABNORMAL HIGH (ref 15–41)
Albumin: 2.5 g/dL — ABNORMAL LOW (ref 3.5–5.0)
Alkaline Phosphatase: 78 U/L (ref 38–126)
Anion gap: 13 (ref 5–15)
Anion gap: 16 — ABNORMAL HIGH (ref 5–15)
BILIRUBIN TOTAL: 5.7 mg/dL — AB (ref 0.3–1.2)
BILIRUBIN TOTAL: 5.2 mg/dL — AB (ref 0.3–1.2)
BUN: 35 mg/dL — AB (ref 8–23)
BUN: 33 mg/dL — AB (ref 8–23)
CALCIUM: 7.5 mg/dL — AB (ref 8.9–10.3)
CHLORIDE: 107 mmol/L (ref 98–111)
CO2: 19 mmol/L — ABNORMAL LOW (ref 22–32)
CO2: 22 mmol/L (ref 22–32)
CREATININE: 1.21 mg/dL — AB (ref 0.44–1.00)
Calcium: 7.7 mg/dL — ABNORMAL LOW (ref 8.9–10.3)
Chloride: 111 mmol/L (ref 98–111)
Creatinine, Ser: 1.25 mg/dL — ABNORMAL HIGH (ref 0.44–1.00)
GFR calc Af Amer: 54 mL/min — ABNORMAL LOW (ref >60)
GFR, EST AFRICAN AMERICAN: 52 mL/min — AB (ref >60)
GFR, EST NON AFRICAN AMERICAN: 47 mL/min — AB (ref >60)
GFR, EST NON AFRICAN AMERICAN: 45 mL/min — AB (ref >60)
GLUCOSE: 143 mg/dL — AB (ref 70–99)
Glucose, Bld: 135 mg/dL — ABNORMAL HIGH (ref 70–99)
POTASSIUM: 3.9 mmol/L (ref 3.5–5.1)
Potassium: 6.2 mmol/L — ABNORMAL HIGH (ref 3.5–5.1)
Sodium: 143 mmol/L (ref 135–145)
Sodium: 145 mmol/L (ref 135–145)
TOTAL PROTEIN: 4.3 g/dL — AB (ref 6.5–8.1)
Total Protein: 4.2 g/dL — ABNORMAL LOW (ref 6.5–8.1)

## 2018-07-13 LAB — BASIC METABOLIC PANEL
ANION GAP: 12 (ref 5–15)
Anion gap: 13 (ref 5–15)
BUN: 33 mg/dL — ABNORMAL HIGH (ref 8–23)
BUN: 32 mg/dL — ABNORMAL HIGH (ref 8–23)
CALCIUM: 7.6 mg/dL — AB (ref 8.9–10.3)
CO2: 23 mmol/L (ref 22–32)
CO2: 27 mmol/L (ref 22–32)
Calcium: 7.5 mg/dL — ABNORMAL LOW (ref 8.9–10.3)
Chloride: 108 mmol/L (ref 98–111)
Chloride: 108 mmol/L (ref 98–111)
Creatinine, Ser: 1.2 mg/dL — ABNORMAL HIGH (ref 0.44–1.00)
Creatinine, Ser: 1.19 mg/dL — ABNORMAL HIGH (ref 0.44–1.00)
GFR calc non Af Amer: 47 mL/min — ABNORMAL LOW (ref >60)
GFR, EST AFRICAN AMERICAN: 55 mL/min — AB (ref >60)
GFR, EST AFRICAN AMERICAN: 56 mL/min — AB (ref >60)
GFR, EST NON AFRICAN AMERICAN: 48 mL/min — AB (ref >60)
Glucose, Bld: 118 mg/dL — ABNORMAL HIGH (ref 70–99)
Glucose, Bld: 123 mg/dL — ABNORMAL HIGH (ref 70–99)
POTASSIUM: 3.8 mmol/L (ref 3.5–5.1)
Potassium: 4.1 mmol/L (ref 3.5–5.1)
Sodium: 144 mmol/L (ref 135–145)
Sodium: 147 mmol/L — ABNORMAL HIGH (ref 135–145)

## 2018-07-13 LAB — PREPARE FRESH FROZEN PLASMA
UNIT DIVISION: 0
Unit division: 0
Unit division: 0

## 2018-07-13 LAB — CBC
HEMATOCRIT: 40.8 % (ref 36.0–46.0)
HEMOGLOBIN: 12.5 g/dL (ref 12.0–15.0)
MCH: 24.9 pg — ABNORMAL LOW (ref 26.0–34.0)
MCHC: 30.6 g/dL (ref 30.0–36.0)
MCV: 81.1 fL (ref 78.0–100.0)
Platelets: 70 10*3/uL — ABNORMAL LOW (ref 150–400)
RBC: 5.03 MIL/uL (ref 3.87–5.11)
RDW: 26.1 % — AB (ref 11.5–15.5)
WBC: 5.2 10*3/uL (ref 4.0–10.5)

## 2018-07-13 LAB — TYPE AND SCREEN
ABO/RH(D): O POS
Antibody Screen: NEGATIVE
UNIT DIVISION: 0
UNIT DIVISION: 0
UNIT DIVISION: 0

## 2018-07-13 LAB — BPAM RBC
BLOOD PRODUCT EXPIRATION DATE: 201908192359
BLOOD PRODUCT EXPIRATION DATE: 201909012359
BLOOD PRODUCT EXPIRATION DATE: 201909012359
ISSUE DATE / TIME: 201907282026
ISSUE DATE / TIME: 201907282026
ISSUE DATE / TIME: 201907282026
UNIT TYPE AND RH: 5100
Unit Type and Rh: 5100
Unit Type and Rh: 5100

## 2018-07-13 LAB — BPAM FFP
BLOOD PRODUCT EXPIRATION DATE: 201907312359
BLOOD PRODUCT EXPIRATION DATE: 201908012359
Blood Product Expiration Date: 201907312359
Blood Product Expiration Date: 201908012359
Blood Product Expiration Date: 201908012359
ISSUE DATE / TIME: 201907281423
ISSUE DATE / TIME: 201907281833
ISSUE DATE / TIME: 201907281833
ISSUE DATE / TIME: 201907281833
ISSUE DATE / TIME: 201907281833
UNIT TYPE AND RH: 6200
Unit Type and Rh: 6200
Unit Type and Rh: 600
Unit Type and Rh: 6200
Unit Type and Rh: 6200

## 2018-07-13 LAB — LACTIC ACID, PLASMA
LACTIC ACID, VENOUS: 8.7 mmol/L — AB (ref 0.5–1.9)
LACTIC ACID, VENOUS: 6 mmol/L — AB (ref 0.5–1.9)
LACTIC ACID, VENOUS: 4.9 mmol/L — AB (ref 0.5–1.9)

## 2018-07-13 LAB — HEMOGLOBIN AND HEMATOCRIT, BLOOD
HCT: 39.4 % (ref 36.0–46.0)
HCT: 35.3 % — ABNORMAL LOW (ref 36.0–46.0)
HEMATOCRIT: 34.1 % — AB (ref 36.0–46.0)
HEMATOCRIT: 37.3 % (ref 36.0–46.0)
HEMATOCRIT: 39.8 % (ref 36.0–46.0)
HEMOGLOBIN: 11.6 g/dL — AB (ref 12.0–15.0)
HEMOGLOBIN: 12.2 g/dL (ref 12.0–15.0)
HEMOGLOBIN: 12.2 g/dL (ref 12.0–15.0)
Hemoglobin: 11.3 g/dL — ABNORMAL LOW (ref 12.0–15.0)
Hemoglobin: 11 g/dL — ABNORMAL LOW (ref 12.0–15.0)

## 2018-07-13 LAB — MAGNESIUM
MAGNESIUM: 2.5 mg/dL — AB (ref 1.7–2.4)
MAGNESIUM: 2.2 mg/dL (ref 1.7–2.4)

## 2018-07-13 LAB — PREPARE PLATELET PHERESIS: Unit division: 0

## 2018-07-13 LAB — FIBRINOGEN: FIBRINOGEN: 309 mg/dL (ref 210–475)

## 2018-07-13 LAB — GLUCOSE, CAPILLARY
GLUCOSE-CAPILLARY: 124 mg/dL — AB (ref 70–99)
GLUCOSE-CAPILLARY: 131 mg/dL — AB (ref 70–99)
Glucose-Capillary: 107 mg/dL — ABNORMAL HIGH (ref 70–99)
Glucose-Capillary: 111 mg/dL — ABNORMAL HIGH (ref 70–99)
Glucose-Capillary: 152 mg/dL — ABNORMAL HIGH (ref 70–99)
Glucose-Capillary: 232 mg/dL — ABNORMAL HIGH (ref 70–99)

## 2018-07-13 LAB — TROPONIN I: Troponin I: 0.07 ng/mL (ref <0.03)

## 2018-07-13 LAB — PROCALCITONIN: PROCALCITONIN: 55.17 ng/mL

## 2018-07-13 LAB — PROTIME-INR
INR: 1.74
Prothrombin Time: 20.2 seconds — ABNORMAL HIGH (ref 11.4–15.2)

## 2018-07-13 LAB — BPAM PLATELET PHERESIS
BLOOD PRODUCT EXPIRATION DATE: 201907282359
ISSUE DATE / TIME: 201907272322
UNIT TYPE AND RH: 7300

## 2018-07-13 LAB — COOXEMETRY PANEL
CARBOXYHEMOGLOBIN: 1.4 % (ref 0.5–1.5)
Methemoglobin: 1 % (ref 0.0–1.5)
O2 SAT: 68.6 %
TOTAL HEMOGLOBIN: 12.3 g/dL (ref 12.0–16.0)

## 2018-07-13 MED ORDER — FENTANYL CITRATE (PF) 100 MCG/2ML IJ SOLN
100.0000 ug | INTRAMUSCULAR | Status: DC | PRN
  Filled 2018-07-13: qty 2

## 2018-07-13 MED ORDER — CHLORHEXIDINE GLUCONATE CLOTH 2 % EX PADS
6.0000 | MEDICATED_PAD | Freq: Once | CUTANEOUS | Status: AC
  Administered 2018-07-14: 6 via TOPICAL

## 2018-07-13 MED ORDER — PHENYLEPHRINE HCL-NACL 40-0.9 MG/250ML-% IV SOLN
0.0000 ug/min | INTRAVENOUS | Status: DC
  Administered 2018-07-13: 10 ug/min via INTRAVENOUS
  Filled 2018-07-13: qty 250

## 2018-07-13 MED ORDER — CHLORHEXIDINE GLUCONATE CLOTH 2 % EX PADS
6.0000 | MEDICATED_PAD | Freq: Once | CUTANEOUS | Status: AC
  Administered 2018-07-13: 6 via TOPICAL

## 2018-07-13 MED ORDER — LACTATED RINGERS IV SOLN
INTRAVENOUS | Status: DC
  Administered 2018-07-13 – 2018-07-15 (×6): via INTRAVENOUS

## 2018-07-13 MED ORDER — FENTANYL BOLUS VIA INFUSION
50.0000 ug | INTRAVENOUS | Status: DC | PRN
  Administered 2018-07-13 – 2018-07-22 (×6): 50 ug via INTRAVENOUS
  Filled 2018-07-13: qty 50

## 2018-07-13 MED ORDER — MIDAZOLAM HCL 2 MG/2ML IJ SOLN
0.5000 mg | INTRAMUSCULAR | Status: DC | PRN
  Administered 2018-07-14 – 2018-07-15 (×6): 1 mg via INTRAVENOUS
  Administered 2018-07-16: 0.5 mg via INTRAVENOUS
  Filled 2018-07-13 (×7): qty 2

## 2018-07-13 MED ORDER — FENTANYL 2500MCG IN NS 250ML (10MCG/ML) PREMIX INFUSION
25.0000 ug/h | INTRAVENOUS | Status: DC
  Administered 2018-07-13: 50 ug/h via INTRAVENOUS
  Administered 2018-07-14: 12:00:00 via INTRAVENOUS
  Administered 2018-07-14 – 2018-07-15 (×2): 200 ug/h via INTRAVENOUS
  Administered 2018-07-15: 50 ug/h via INTRAVENOUS
  Administered 2018-07-17: 100 ug/h via INTRAVENOUS
  Administered 2018-07-18 – 2018-07-19 (×2): 125 ug/h via INTRAVENOUS
  Administered 2018-07-20: 150 ug/h via INTRAVENOUS
  Administered 2018-07-20: 17:00:00 via INTRAVENOUS
  Administered 2018-07-21 – 2018-07-22 (×2): 150 ug/h via INTRAVENOUS
  Filled 2018-07-13 (×13): qty 250

## 2018-07-13 MED ORDER — CEFAZOLIN SODIUM-DEXTROSE 2-4 GM/100ML-% IV SOLN
2.0000 g | INTRAVENOUS | Status: AC
  Administered 2018-07-14: 2 g via INTRAVENOUS

## 2018-07-13 MED ORDER — FENTANYL CITRATE (PF) 100 MCG/2ML IJ SOLN
100.0000 ug | INTRAMUSCULAR | Status: DC | PRN
  Administered 2018-07-13 (×2): 100 ug via INTRAVENOUS
  Filled 2018-07-13: qty 2

## 2018-07-13 MED FILL — Medication: Qty: 1 | Status: AC

## 2018-07-13 NOTE — Progress Notes (Signed)
1 Day Post-Op   Subjective/Chief Complaint: Pt on vent   Family at bedside    Objective: Vital signs in last 24 hours: Temp:  [96 F (35.6 C)-98.9 F (37.2 C)] 98.9 F (37.2 C) (07/29 0912) Pulse Rate:  [65-139] 97 (07/29 0915) Resp:  [0-38] 18 (07/29 0915) BP: (60-143)/(35-120) 87/61 (07/29 0746) SpO2:  [89 %-100 %] 100 % (07/29 0915) Arterial Line BP: (83-155)/(50-91) 106/73 (07/29 0915) FiO2 (%):  [50 %-100 %] 50 % (07/29 0800) Last BM Date: 07/11/18  Intake/Output from previous day: 07/28 0701 - 07/29 0700 In: 15171.7 [I.V.:4306.4; Blood:2760.1; NG/GT:990; IV Piggyback:7115.2] Out: 1245 [Urine:425; Emesis/NG output:1050; Drains:1800] Intake/Output this shift: Total I/O In: 532 [I.V.:532] Out: 550 [Urine:50; Drains:500]  Incision/Wound:vac on place serous ND   Lab Results:  Recent Labs    07/12/18 2012  07/13/18 0431 07/13/18 0649  WBC 1.5*  --  5.2  --   HGB 4.8*   < > 12.5 12.2  HCT 16.5*   < > 40.8 39.4  PLT 67*  --  70*  --    < > = values in this interval not displayed.   BMET Recent Labs    07/13/18 0533 07/13/18 0700  NA 143 145  K 6.2* 3.9  CL 111 107  CO2 19* 22  GLUCOSE 143* 135*  BUN 35* 33*  CREATININE 1.21* 1.25*  CALCIUM 7.5* 7.7*   PT/INR Recent Labs    07/12/18 2012 07/13/18 0649  LABPROT 23.9* 20.2*  INR 2.16 1.74   ABG Recent Labs    07/12/18 2003 07/13/18 1010  PHART 7.255* 7.471*  HCO3 17.2* 22.1    Studies/Results: Ct Abdomen Pelvis Wo Contrast  Result Date: 07/12/2018 CLINICAL DATA:  Coffee ground emesis. Concern for aspiration. Nausea and abdominal pain. EXAM: CT ABDOMEN AND PELVIS WITHOUT CONTRAST TECHNIQUE: Multidetector CT imaging of the abdomen and pelvis was performed following the standard protocol without IV contrast. COMPARISON:  July 02, 2018. FINDINGS: Lower chest: Bilateral pleural effusions with underlying opacities, possibly atelectasis, are similar since the July 02, 2018 study. Lung bases are  otherwise unchanged. Hepatobiliary: Previous cholecystectomy. The liver is otherwise unremarkable. Pancreas: Unremarkable. No pancreatic ductal dilatation or surrounding inflammatory changes. Spleen: Normal in size without focal abnormality. Adrenals/Urinary Tract: Nonobstructive stones are seen in both kidneys. No ureterectasis. The bladder is decompressed with a Foley catheter. Stomach/Bowel: A feeding tube terminates in the proximal jejunum. An NG tube terminates in the distal stomach. The stomach, small bowel, and colon are poorly assessed without contrast. The stomach is grossly unremarkable. There is no small bowel obstruction. No definitive abnormal loops of small bowel. There is a tube in the rectum. There is fluid in the rectum. No definitive colonic abnormalities noted. The appendix is not seen but there is no secondary evidence of appendicitis. Vascular/Lymphatic: Minimal atherosclerotic changes in the nonaneurysmal aorta. No adenopathy. Reproductive: Uterus and bilateral adnexa are unremarkable. Other: There is free air in the abdomen. The source of free air is unclear. There is also ascites in the abdomen, particularly around the liver and in the pelvis, similar in the interval. Musculoskeletal: No acute or significant osseous findings. IMPRESSION: 1. There is free air in the abdomen consistent bowel rupture. A specific source is not identified. 2. Bilateral pleural effusions with underlying opacities are stable since July 02, 2018. The underlying opacity is likely atelectasis. 3. Nonobstructive stones in the kidneys. 4. Atherosclerotic changes in the nonaneurysmal aorta. Findings discussed with the patient's nurse, April. The ICU physician was  in the room at the time of the discussion and is aware. Electronically Signed   By: Dorise Bullion III M.D   On: 07/12/2018 14:47   Dg Chest Port 1 View  Result Date: 07/13/2018 CLINICAL DATA:  Vomiting, acute respiratory failure, atrial fibrillation,  intubated patient. EXAM: PORTABLE CHEST 1 VIEW COMPARISON:  For chest x-ray of July 12, 2018. FINDINGS: The lungs are reasonably well inflated. There is increased density at both lung bases greatest on the right which is obscuring the hemidiaphragms. This is more conspicuous than on yesterday's study. The cardiac silhouette is enlarged. The pulmonary vascularity is mildly engorged. The endotracheal tube tip projects approximately 1.5 cm above the carina. The esophagogastric tubes proximal port projects at or just below the left hemidiaphragm. The feeding tube tip is not visible on the study. The right-sided PICC line tip and the right internal jugular venous catheter's tip project over the junction of the proximal and midportions of the SVC. IMPRESSION: Slight improved aeration of both lungs. Abnormal interstitial density in both lungs inferiorly. Probable posterior layering pleural effusions. Mild cardiac enlargement and central pulmonary vascular congestion. The support tubes are in position as described. Electronically Signed   By: David  Martinique M.D.   On: 07/13/2018 07:18   Dg Chest Port 1 View  Result Date: 07/12/2018 CLINICAL DATA:  Check endotracheal tube placement EXAM: PORTABLE CHEST 1 VIEW COMPARISON:  Film from earlier in the same day FINDINGS: Endotracheal tube is been withdrawn and now lies 1 cm above the carina. PICC line, feeding catheter and nasogastric catheter are noted in satisfactory position. Cardiac shadow is stable. Elevation of the right hemidiaphragm is again seen. No new focal abnormality is noted. IMPRESSION: Interval repositioning of the endotracheal tube into the distal trachea. No new focal abnormality is noted. Electronically Signed   By: Inez Catalina M.D.   On: 07/12/2018 15:55   Dg Chest Portable 1 View  Result Date: 07/12/2018 CLINICAL DATA:  Check nasogastric catheter placement EXAM: PORTABLE CHEST 1 VIEW COMPARISON:  07/12/2018 FINDINGS: Nasogastric catheter has been  withdrawn and readvanced into the stomach. Feeding catheter is again noted within the stomach. The endotracheal tube is seen and lies within the right mainstem bronchus and should be withdrawn 2-3 cm. Right-sided PICC line is noted in satisfactory position. Some mild increased density in the left lung is noted likely related to incomplete expansion due to the right mainstem intubation. IMPRESSION: Endotracheal tube within the right mainstem bronchus. Some associated increased density within the under expanded left lung is noted. Nasogastric catheter has been repositioned into the stomach. Electronically Signed   By: Inez Catalina M.D.   On: 07/12/2018 15:52   Dg Chest Port 1 View  Result Date: 07/12/2018 CLINICAL DATA:  Aspiration EXAM: PORTABLE CHEST 1 VIEW COMPARISON:  07/11/2018, 07/10/2018, 07/09/2018 FINDINGS: Feeding tube tip below the diaphragm but non included. Right central venous catheter tip overlies the cavoatrial region. Esophageal tube tip overlies the left lower lobe bronchial region. Cardiomegaly. Small pleural effusion and dense consolidation at the left base. No pneumothorax. IMPRESSION: 1. Newly inserted esophageal tube tip overlies left lower lobe bronchus. 2. Stable cardiomegaly, small left effusion and dense consolidation at the left base. Critical Value/emergent results were called by telephone at the time of interpretation on 07/12/2018 at 4:01 am to pt nurse Lexine Baton , who verbally acknowledged these results. Electronically Signed   By: Donavan Foil M.D.   On: 07/12/2018 04:01   Dg Chest Port 1 View  Result Date:  07/11/2018 CLINICAL DATA:  Hypoxia, generalized abdominal pain EXAM: PORTABLE CHEST 1 VIEW COMPARISON:  07/10/2018 FINDINGS: Right PICC line is in place. The tip is in the right atrium approximately 4 cm deep to the cavoatrial junction. Cardiomegaly with vascular congestion. Left lower lobe opacity, likely compressive atelectasis. No visible significant effusions. IMPRESSION:  Right PICC line tip in the in right atrium 4 cm below the cavoatrial junction. Cardiomegaly with vascular congestion. Left lower lobe atelectasis. Electronically Signed   By: Rolm Baptise M.D.   On: 07/11/2018 16:53   Dg Abd Portable 1v  Result Date: 07/12/2018 CLINICAL DATA:  Nasogastric tube placement. EXAM: PORTABLE ABDOMEN - 1 VIEW COMPARISON:  Abdominal radiograph July 11, 2018 FINDINGS: Nasogastric tube tip projects in mid stomach. Feeding tube tip projecting in third portion the duodenum. Paucity of bowel gas. No intra-abdominal mass effect or pathologic calcification. Surgical clips in the included right abdomen compatible with cholecystectomy. Soft tissue planes and included osseous structures are unchanged. IMPRESSION: Nasogastric tube tip projects in mid stomach. Feeding tube tip projecting in third portion of duodenum. Nonspecific bowel gas pattern. Electronically Signed   By: Elon Alas M.D.   On: 07/12/2018 05:05   Dg Abd Portable 1v  Result Date: 07/11/2018 CLINICAL DATA:  Hypoxia, generalized abdominal pain EXAM: PORTABLE ABDOMEN - 1 VIEW COMPARISON:  07/06/2018 FINDINGS: Feeding tube is in place with the tip in the distal duodenum. Mild gaseous distention of the stomach. Relative paucity of gas elsewhere throughout the abdomen. No organomegaly or free air. Prior cholecystectomy. IMPRESSION: Feeding tube tip in the distal duodenum. Mild gaseous distention of the stomach. Electronically Signed   By: Rolm Baptise M.D.   On: 07/11/2018 16:53    Anti-infectives: Anti-infectives (From admission, onward)   Start     Dose/Rate Route Frequency Ordered Stop   07/13/18 2100  fluconazole (DIFLUCAN) IVPB 200 mg     200 mg 100 mL/hr over 60 Minutes Intravenous Every 24 hours 07/12/18 1958     07/13/18 1800  vancomycin (VANCOCIN) IVPB 1000 mg/200 mL premix     1,000 mg 200 mL/hr over 60 Minutes Intravenous Every 24 hours 07/12/18 1549     07/13/18 0100  piperacillin-tazobactam (ZOSYN)  IVPB 3.375 g     3.375 g 12.5 mL/hr over 240 Minutes Intravenous Every 8 hours 07/12/18 1549     07/12/18 2100  fluconazole (DIFLUCAN) IVPB 400 mg     400 mg 100 mL/hr over 120 Minutes Intravenous  Once 07/12/18 1958 07/12/18 2306   07/12/18 1600  vancomycin (VANCOCIN) 1,500 mg in sodium chloride 0.9 % 500 mL IVPB     1,500 mg 250 mL/hr over 120 Minutes Intravenous STAT 07/12/18 1549 07/12/18 1829   07/12/18 1600  piperacillin-tazobactam (ZOSYN) IVPB 3.375 g     3.375 g 100 mL/hr over 30 Minutes Intravenous STAT 07/12/18 1549 07/12/18 1701   07/12/18 0800  ampicillin-sulbactam (UNASYN) 1.5 g in sodium chloride 0.9 % 100 mL IVPB  Status:  Discontinued     1.5 g 200 mL/hr over 30 Minutes Intravenous Every 6 hours 07/12/18 0715 07/12/18 1538   07/04/18 1000  rifaximin (XIFAXAN) tablet 550 mg  Status:  Discontinued     550 mg Per Tube 3 times daily 07/04/18 0925 07/12/18 1647   06/30/18 2200  vancomycin (VANCOCIN) IVPB 1000 mg/200 mL premix  Status:  Discontinued     1,000 mg 200 mL/hr over 60 Minutes Intravenous Every 24 hours 06/30/18 2008 07/04/18 0925   06/30/18 2200  piperacillin-tazobactam (  ZOSYN) IVPB 3.375 g  Status:  Discontinued     3.375 g 12.5 mL/hr over 240 Minutes Intravenous Every 8 hours 06/30/18 2011 07/06/18 1149   06/30/18 0000  vancomycin variable dose per unstable renal function (pharmacist dosing)  Status:  Discontinued      Does not apply See admin instructions 06/29/18 1229 07/03/18 0747   06/29/18 1300  piperacillin-tazobactam (ZOSYN) IVPB 2.25 g  Status:  Discontinued     2.25 g 100 mL/hr over 30 Minutes Intravenous Every 8 hours 06/29/18 1229 06/30/18 2011   06/29/18 1300  vancomycin (VANCOCIN) 1,250 mg in sodium chloride 0.9 % 250 mL IVPB     1,250 mg 166.7 mL/hr over 90 Minutes Intravenous  Once 06/29/18 1229 06/29/18 2234      Assessment/Plan: s/p Procedure(s): EXPLORATORY LAPAROTOMY FOR FREE AIR (N/A) Second look ex lap Tuesday with  closure and  ileostomy   Will need more colon removed  Discussed with family at bedside  The procedure has been discussed with the patient.  Alternative therapies have been discussed with the patient.  Operative risks include bleeding,  Infection,  Organ injury,  Nerve injury,  Blood vessel injury,  DVT,  Pulmonary embolism,  Death,  And possible reoperation.  Medical management risks include worsening of present situation.  The success of the procedure is 50 -90 % at treating patients symptoms.  The patient understands and agrees to proceed.  LOS: 20 days    Joyice Faster Nazarene Bunning 07/13/2018

## 2018-07-13 NOTE — Progress Notes (Signed)
CRITICAL VALUE ALERT  Critical Value:  Lactic 4.9  Date & Time Notied:  07/13/18 1542  Provider Notified: Yes  Orders Received/Actions taken: none at this time

## 2018-07-13 NOTE — Progress Notes (Signed)
SLP Cancellation Note  Patient Details Name: Brandi [name] MRN: [id number] DOB: [birth year]   Cancelled treatment:        Pt intubated. Will follow    Houston Siren 07/13/2018, 8:32 AM

## 2018-07-13 NOTE — Progress Notes (Signed)
eLink Physician-Brief Progress Note Patient Name: Brandi [name] DOB: Apr 24, 1956 MRN: [id number]   Date of Service  07/13/2018  HPI/Events of Note  COOX = 68.6  eICU Interventions  Continue present management.      Intervention Category Major Interventions: Acid-Base disturbance - evaluation and management  [name] 07/13/2018, 4:43 AM

## 2018-07-13 NOTE — Progress Notes (Signed)
Nutrition Follow-up  DOCUMENTATION CODES:   Obesity unspecified  INTERVENTION:   Vital High Protein @ 20 ml/hr Increase as tolerated to goal rate of 45 ml/hr Pro-Stat 30 mL daily  Provides 1180 kcals, 110 g of protein and 875 mL of free water  NUTRITION DIAGNOSIS:   Inadequate oral intake related to acute illness as evidenced by NPO status.  Ongoing  GOAL:   Patient will meet greater than or equal to 90% of their needs  Not meeting  MONITOR:   Vent status, TF tolerance, Labs, Weight trends  REASON FOR ASSESSMENT:   Consult, Ventilator Enteral/tube feeding initiation and management  ASSESSMENT:   62 yo female admitted 7/9 with weakness, anorexia and lethargy with NASH cirrhosis with hepatic encephalopathy, new onset Afib with RVR, new onset HFrEF 30-35%, anemia. On 7/15,  pt's condition worsened with increased confusion/lethargy, tardive dyskinesia symptoms, AKI, shock liver and respiratory failure requiring intubation.  Pt with hx of HTN, HLD, seizure disorder, left breast cancer s/p lumpectomy 2008, GERD, depression, DM   7/25- extubated 7/28- cardiac arrest, intubated, taken for Ileocecectomy and partial colectomy left in discontinuity, abdominal wound vac placement  TF on hold in preparation for second look ex-lap with closure and ileostomy tomorrow. Cortrak and NGT remain in place. Pt with coffee ground emesis. Wound vac showed to have 1.8 L output overnight.  Will look to re-start trickle feedings once pt undergoes second ex-lap.   Patient is currently intubated on ventilator support MV: 13.8 L/min Temp (24hrs), Avg:97.7 F (36.5 C), Min:96 F (35.6 C), Max:98.9 F (37.2 C)  I/O: +24 L since 7/15 NGT: 1050 ml x 24 hrs UOP: 425 ml x 24 hrs  Medications reviewed and include: solucortef, fentanyl Labs reviewed.   Diet Order:   Diet Order           Diet NPO time specified  Diet effective now          EDUCATION NEEDS:   Education needs have been  addressed  Skin:  Skin Assessment: Skin Integrity Issues: Skin Integrity Issues:: Wound VAC Wound Vac: abdomen Other: MASD: buttocks, groin, perineum  Last BM:  7/28- 100 ml rectal tube  Height:   Ht Readings from Last 1 Encounters:  07/04/18 5\' 2"  (1.575 m)    Weight:   Wt Readings from Last 1 Encounters:  07/12/18 178 lb 2.1 oz (80.8 kg)    Ideal Body Weight:  50 kg  BMI:  Body mass index is 32.58 kg/m.  Estimated Nutritional Needs:   Kcal:  1000-1200 kcal  Protein:  100-115 grams  Fluid:  >/= 1.4 L/day    Mariana Single RD, LDN Clinical Nutrition Pager # - [phone]

## 2018-07-13 NOTE — Progress Notes (Signed)
eLink Physician-Brief Progress Note Patient Name: Brandi [name] DOB: 28-Feb-1956 MRN: [id number]   Date of Service  07/13/2018  HPI/Events of Note  Lactic Acid = 12.3 --> 8.4 --> 8.7. Patient has Hx of NASH. Hgb = 12.2 and CVP = 20.   eICU Interventions  Will order: 1. COOX now.      Intervention Category Major Interventions: Acid-Base disturbance - evaluation and management  Garry Nicolini Eugene 07/13/2018, 2:12 AM

## 2018-07-13 NOTE — Progress Notes (Signed)
PULMONARY / CRITICAL CARE MEDICINE   Name: Brandi [name] MRN: [id number] DOB: [birth year]    ADMISSION DATE:  06/23/2018 CONSULTATION DATE: 06/29/2018  REFERRING MD: Dr. Bonner Puna  CHIEF COMPLAINT: Altered mental status  HISTORY OF PRESENT ILLNESS:  62 y/o F who presented to [facility] on 7/9 with abdominal pain, nausea, vomiting, decreased oral intake and weakness.    She was reportedly evaluated by her PCP prior to admit with elevated LFT's (AST 302, ALT 611, AP 380).  Follow up CT of the abdomen demonstrated prominent caudate lobe and surface irregularity of the liver.   In the ER, US of the abdomen confirmed the CT findings of the liver.  She was noted to be lethargic and have asterixis on exam.  Ammonia 22 (7/10) > 38 (7/12).  The patient had AFwRVR and was treated with cardizem.  She was admitted for further evaluation.  The patient was seen by Cardiology and transitioned to metoprolol for rate control due to decreased LVEF (30-35%). Labs showed a microcytic anemia with ferritin of 9.  GI was consulted for evaluation with recommendations for lactulose and eventual endoscopic evaluation.  She had ongoing encephalopathy and a CT of the head was obtained which showed a right frontal operculum CVA.  Neurology evaluated the patient and felt the stroke was likely related to AF but not the cause of her mental status.  She was recommended anticoagulation and passed a swallow evaluation converting from heparin to eliquis.  On 7/15, her confusion, tardive dyskinesia symptoms and lethargy worsened as well as her LFT's (AST 3942, ALT 1976, ALK Phos 404), renal function and WBC.  PCCM consulted for evaluation.  Anticoagulation stopped due to shock liver.  PSY meds were held due to severe tardive dyskinesia and liver dysfunction.  She had prolonged intubation due to poor mental status.  The patient was extubated on 7/25 without difficulty.  She was transitioned to [facility] on 7/26.  She developed agitated delirium overnight  7/26 and precedex was initiated by [facility]Link.  PCCM called back 7/28 to assess patient as she had coffee ground emesis overnight, concern for aspiration, hypotension that responded to 595m saline bolus x2.  As day progressed on 7/28, she had progressive abdominal pain.  CT of the abd/pelvis demonstrated bowel perforation.  She was intubated, had brief arrest post intubation (9 min) and was taken to the OR.  Returned to ICU on epi, neo, vaso, levo, bicarb.  Pressor need improved overnight into 7/29.    SUBJECTIVE: Post op OR, abd remains open with VAC.  Weaning vasopressors / off epi gtt.  1.8L out of wound vac overnight.   VITAL SIGNS: BP (!) 87/61 Comment: aline pressure  Pulse 97   Temp 98.2 F (36.8 C) (Axillary)   Resp (!) 30   Ht 5' 2" (1.575 m)   Wt 178 lb 2.1 oz (80.8 kg)   SpO2 100%   BMI 32.58 kg/m   HEMODYNAMICS: CVP:  [12 mmHg-20 mmHg] 14 mmHg  VENTILATOR SETTINGS: Vent Mode: PRVC FiO2 (%):  [50 %-100 %] 50 % Set Rate:  [30 bmp] 30 bmp Vt Set:  [450 mL-460 mL] 460 mL PEEP:  [5 cmH20] 5 cmH20 Plateau Pressure:  [16 cmH20-19 cmH20] 19 cmH20  INTAKE / OUTPUT:  Intake/Output Summary (Last 24 hours) at 07/13/2018 0827 Last data filed at 07/13/2018 0732 Gross per 24 hour  Intake 14789.58 ml  Output 3665 ml  Net 11124.58 ml    PHYSICAL EXAMINATION: General:  Critically ill appearing female on  vent HEENT: MM pale, moist.  No jvd.  Coretrak/NGT in place.  Neuro: sedate/opens eyes, follows commands (wiggles toes to command) CV: s1s2 rrr, no m/r/g PULM: even/non-labored, lungs bilaterally coarse, diminished bases  GI: soft, non-tender, wound vac midline c/d/i  Extremities: warm/dry, generalized 2+ edema  Skin: pale, cool, dusky toes  LABS:  BMET Recent Labs  Lab 07/12/18 2012 07/13/18 0533 07/13/18 0700  NA 151* 143 145  K 3.4* 6.2* 3.9  CL 112* 111 107  CO2 27 19* 22  BUN 34* 35* 33*  CREATININE 1.10* 1.21* 1.25*  GLUCOSE 195* 143* 135*     Electrolytes Recent Labs  Lab 07/07/18 0420  07/12/18 2012 07/13/18 0533 07/13/18 0700  CALCIUM 8.2*   < > 7.6* 7.5* 7.7*  MG 1.5*   < > 1.7 2.5* 2.2  PHOS 2.7  --  3.4  --   --    < > = values in this interval not displayed.    CBC Recent Labs  Lab 07/12/18 1506  07/12/18 2012 07/13/18 0059 07/13/18 0431 07/13/18 0649  WBC 4.4  --  1.5*  --  5.2  --   HGB 7.7*   < > 4.8* 12.2 12.5 12.2  HCT 27.1*   < > 16.5* 39.8 40.8 39.4  PLT 82*  --  67*  --  70*  --    < > = values in this interval not displayed.    Coag's Recent Labs  Lab 07/12/18 1106 07/12/18 2012 07/13/18 0649  INR 1.64 2.16 1.74    Sepsis Markers Recent Labs  Lab 07/07/18 0420  07/12/18 1507 07/12/18 2012 07/13/18 0125  LATICACIDVEN  --    < > 12.3* 8.4* 8.7*  PROCALCITON 1.17  --   --  21.93  --    < > = values in this interval not displayed.    ABG Recent Labs  Lab 07/12/18 1610 07/12/18 1816 07/12/18 2003  PHART 7.230* 7.226* 7.255*  PCO2ART 35.0 40.2 38.8  PO2ART 329.0* 139.0* 66.0*    Liver Enzymes Recent Labs  Lab 07/12/18 2012 07/13/18 0533 07/13/18 0700  AST 74* 155* 103*  ALT 44 75* 88*  ALKPHOS 47 72 78  BILITOT 2.7* 5.7* 5.2*  ALBUMIN 2.3* 2.4* 2.5*    Cardiac Enzymes Recent Labs  Lab 07/12/18 1755 07/12/18 2012 07/12/18 2323  TROPONINI 0.07* 0.07* 0.07*    Glucose Recent Labs  Lab 07/12/18 1146 07/12/18 1553 07/12/18 1948 07/13/18 0003 07/13/18 0348 07/13/18 0804  GLUCAP 167* 189* 179* 232* 152* 107*    Imaging  STUDIES:  [place] ABD 7/6 >> possible cirrhosis, gallbladder surgically absent CT Head 7/9 >> atrophy with small vessel chronic ischemic changes of deep cerebral white matter CT Renal Study 7/9 >> no acute findings, bilateral non-obstructing renal stones ECHO 7/9 >> LV mildly dilated, wall thickness was increased in a pattern of moderate LVH, LVEF 30-35%, severe hypokinesis of the anteroseptal, anterior, inferoseptal & apical myocardium,  mild to moderate MV regurgitation, LA severely dilated, RV moderately dilated, RA mod-severely dilated, PA pressure 75mHg, trivial pericardial effusion. CTA Head, Neck 7/12 >> minimal calcified plaque at the carotid bifurcation regions but no stenosis or irregularity, no large vessel occlusions, unable to specifically identify the missing branch vessel responsible for the right posterior frontal infarction but this is presumably an occluded M3 or distal branch CT Head w/o 7/12 >> the small right middle cerebral artery distribution, posterolateral frontal lobe infarct has evolved since the prior CT, it is now  seen as a well defined area of hypoattenuation, no new areas of infarct, no ICH CT head 7/15 >> Redemonstration of right posterior frontal lobe infarct. No acute intracranial hemorrhage or new infarct identified.  Atrophy with chronic small vessel ischemia. CT abd/pelvis 7/18 > Diffuse dilation of the colon with large amount of ascending colonic stool.  CT ABD/Pelvis 7/28 >> free air in the abd consistent with bowel rupture, unable to identify source, bilateral pleural effusions (stable), non-obstructive kidney stones  CULTURES: BCx2 7/10 >> negative  Cdiff 7/20 >> negative  Sputum 7/20 >> negative  ANTIBIOTICS: Vanco 7/15 >> 7/21 Zoysn 7/15 >> 7/22 Unasyn 7/28 >> 7/28 Diflucan 7/28 >>  Zosyn 7/28 >>  Vanco 7/28 >>  SIGNIFICANT EVENTS: 7/09  Admit  7/15  PCCM consulted with AMS, acute liver failure 7/19  Had been receiving fluid challenges for hypotension. 7/20   Early in the a.m. hours drop blood pressure.  Placed on pressors.  Non-anion gap acidosis noted.  Cortisol 29.2 CBC remains elevated blood cell count remains elevated.  7/21  no significant fever.  White cell count cut in half.  Chest x-ray looks improved.  We resumed lactulose and rifaximin yesterday, also resumed spironolactone.  Still obtunded  7/22  Free water increased 7/23  800 ml in flexiseal overnight.  Off home PSY  meds. Mg/K low.  Na 153 on free water. Continues to have tardive dyskinesia.  7/24 Tmax 100.2.  I/O- positive 8.6L for admit / 2125 of stool out 7/23. 7/26  extubated on 7/25. Remains encephalopathic. NA still elevated  7/28  PCCM called back for hypotension on precedex, coffee ground emesis. Concern for aspiration, hypotension that responded to 545m saline bolus x2.  TF held overnight with nausea/abd pain.  NGT placed due to vomiting. ABD pain increased > CT abd with bowel perforation > to OR for resection/wash out   LINES/TUBES: ETT 7/15 >> 7/25 ETT 7/28 >>  L Rad Aline 7/28 >>  R IJ dual lumen 7/28 >>  DISCUSSION: 62y/o F admitted with weakness, decreased intake and lethargy.  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  She developed AMS > small infarct thought not related to AMS.  Recommended for anticoagulation by Cardiology/Neurology, was transitioned from heparin to eliquis.  The patient deteriorated on 7/15 with a rise in LFT's, AKI, confusion/AMS, worsening of tardive dyskinesia type symptoms.  Transferred to ICU.    ICU course significant for slow improvement of mental status (she actually looks the best I have seen her on 7/28).  She remains on room air with no distress.  She has had periods of agitation which I suspect are related to being off her maintenance PSY regimen (due to liver dysfunction). She has been seen by PSY (see recommendations).  Her electrolytes have difficult to manage in the setting of high stool output & poor nutritional status. She has been on free water and dextrose via IV. She pulled her flexiseal out multiple times overnight 7/28.  ABD pain worsened 7/28 during the day > CT ABD with perforation, to OR with Dr. BBarry Dienes    ASSESSMENT / PLAN:  Resolved issues: AKI Acute respiratory failure in setting of neurological dysfunction  Ileus Volume overload  Bradycardia - into the 20's 7/23 am, on 561mBID of lopressor, dose reduced   Active issues    Acute Hypoxic Respiratory Failure - in setting of bowel perforation, shock Bilateral Pleural Effusions - suspect in setting of liver disease, HF, AKI P: PRVC 8 cc/kg  Wean PEEP / FiO2 for sats >90% Now ABG and in AM  Follow CXR ABX as above   Acute metabolic & hepatic encephalopathy / acute delirium - improved CVA - Right Frontal Operculum infarct - felt related to AF/embolic but not all cause of mental status Right-sided weakness Seizure disorder Schizophrenia Tardive Dyskinesia - improved 7/28  P: All PO PSY meds on hold  PSY Rec's > no effexor, abilify 29m QD for mood, Buspar for anxiety  Monitor off medications above, supportive care  Continue Vimpat, keppra  Early PT efforts when appropriate   HFrEF - LVEF 30% AF with RVR Hypotension - 7/28, suspect related to intravascular volume depletion, sedation Hx HTN  [facility] P: Hold anticoagulation, will need to address at some point  Hold lopressor PO  Wean vasopressors for MAP >65 Stress dose steroids   AGMA - in setting of bowel perforation, shock  Fluid and Electrolyte imbalance: Hypernatremia / Hyperchloremia, Hypokalemia  P: Continue sodium bicarbonate gtt at 150 ml/h until ABG returned  Repeat BMP at 1600 with lactic acid Trend BMP / urinary output Replace electrolytes as indicated Avoid nephrotoxic agents, ensure adequate renal perfusion Assess lactate, ABG now   NKarlene LinemanCirrhosis with Hepatic Encephalopathy  - unclear in what precipitated change in events LFTs/ decompensation 7/15 - 7/15 UKorealiver w/doppler with no evidence of portal venous thrombosis  -LFTs improved P: Follow intermittent LFT's  Lactulose, spironolactone on hold  Perforated Viscus  At Risk Abscess Development  - mid descending colon perforation  P: Per CCS  Wound VAC Plan for return for closure in the next few days   Anemia  Thrombocytopenia  At Risk DIC  P: Trend CBC  No  anticoagulation  Leukocytosis, questionable sepsis however etiology unclear.   - Antibiotics + antifungal restarted 7/28 with abd perforation  P: Trend CBC Monitor  Monitor fever curve  Coagulopathy Thrombocytopenia - s/p 3 units FFP 7/16 am -Most recent INR was on 7/22: 1.65 P: Hold anticoagulation with bleeding risk, resolving hepatic dysfunction  Monitor for bleeding  Coffee Ground Emesis - new 7/28 Vomiting P: GI following BID H2 Increase PPI to BID   DM 2 P: SSI  Hx Breast Cancer P: Follow up as outpatient  DVT prophylaxis: SCD's SUP: H2 BID Diet: NPO Activity: BR Disposition : ICU, critically ill   FAMILY  - Updates: Family updated at bedside am 7/29 per NP & Dr. BLamonte Sakai  She remains critically ill and is high risk decompensation.    CC Time: 30 minutes      BNoe Gens NP-C [facility] Pgr: [phone] or if no answer [phone]/29/2019, 8:27 AM

## 2018-07-13 NOTE — Progress Notes (Signed)
CRITICAL VALUE ALERT  Critical Value:  Lactic 6.0  Date & Time Notied:  07/13/18 1040  Provider Notified: Yes  Orders Received/Actions taken: None at this time

## 2018-07-13 NOTE — Progress Notes (Signed)
CRITICAL VALUE ALERT  Critical Value:  Lactic 8.7  Date & Time Notied: 07/13/18 0207  Provider Notified: Oletta Darter MD  Orders Received/Actions taken: coox ordered.

## 2018-07-13 NOTE — Progress Notes (Signed)
CRITICAL VALUE ALERT  Critical Value:  Lactic 8.4  Date & Time Notied:  07/12/18  2125  Provider Notified: Oletta Darter MD  Orders Received/Actions taken: No new orders at this time- trending lactic acid

## 2018-07-13 NOTE — Progress Notes (Signed)
RT note: sputum sample obtained and sent down to main lab without complications. 

## 2018-07-13 NOTE — Addendum Note (Signed)
Addendum  created 07/13/18 0333 by Babs Bertin, CRNA   Intraprocedure Staff edited

## 2018-07-14 ENCOUNTER — Inpatient Hospital Stay (HOSPITAL_COMMUNITY): Payer: Medicare Other | Admitting: Critical Care Medicine

## 2018-07-14 ENCOUNTER — Encounter (HOSPITAL_COMMUNITY): Payer: Self-pay | Admitting: Critical Care Medicine

## 2018-07-14 ENCOUNTER — Encounter (HOSPITAL_COMMUNITY)
Admission: EM | Disposition: A | Discharge status: Nursing Home (Includes ICF) | Payer: Self-pay | Source: Home / Self Care | Attending: Pulmonary Disease

## 2018-07-14 ENCOUNTER — Inpatient Hospital Stay (HOSPITAL_COMMUNITY): Payer: Medicare Other

## 2018-07-14 DIAGNOSIS — K631 Perforation of intestine (nontraumatic): Secondary | ICD-10-CM

## 2018-07-14 HISTORY — PX: LAPAROTOMY: SHX154

## 2018-07-14 HISTORY — PX: APPLICATION OF WOUND VAC: SHX5189

## 2018-07-14 HISTORY — PX: COLON RESECTION: SHX5231

## 2018-07-14 HISTORY — PX: ILEOSTOMY: SHX1783

## 2018-07-14 LAB — COMPREHENSIVE METABOLIC PANEL
ALT: 83 U/L — AB (ref 0–44)
AST: 67 U/L — AB (ref 15–41)
Albumin: 1.9 g/dL — ABNORMAL LOW (ref 3.5–5.0)
Alkaline Phosphatase: 107 U/L (ref 38–126)
Anion gap: 10 (ref 5–15)
BUN: 32 mg/dL — ABNORMAL HIGH (ref 8–23)
CHLORIDE: 107 mmol/L (ref 98–111)
CO2: 28 mmol/L (ref 22–32)
CREATININE: 1.16 mg/dL — AB (ref 0.44–1.00)
Calcium: 7.5 mg/dL — ABNORMAL LOW (ref 8.9–10.3)
GFR, EST AFRICAN AMERICAN: 57 mL/min — AB (ref >60)
GFR, EST NON AFRICAN AMERICAN: 49 mL/min — AB (ref >60)
Glucose, Bld: 156 mg/dL — ABNORMAL HIGH (ref 70–99)
POTASSIUM: 3.3 mmol/L — AB (ref 3.5–5.1)
SODIUM: 145 mmol/L (ref 135–145)
Total Bilirubin: 6.3 mg/dL — ABNORMAL HIGH (ref 0.3–1.2)
Total Protein: 3.9 g/dL — ABNORMAL LOW (ref 6.5–8.1)

## 2018-07-14 LAB — CBC
HCT: 33 % — ABNORMAL LOW (ref 36.0–46.0)
Hemoglobin: 10.4 g/dL — ABNORMAL LOW (ref 12.0–15.0)
MCH: 24.5 pg — AB (ref 26.0–34.0)
MCHC: 31.5 g/dL (ref 30.0–36.0)
MCV: 77.6 fL — ABNORMAL LOW (ref 78.0–100.0)
PLATELETS: 40 10*3/uL — AB (ref 150–400)
RBC: 4.25 MIL/uL (ref 3.87–5.11)
RDW: 27.1 % — ABNORMAL HIGH (ref 11.5–15.5)
WBC: 18.9 10*3/uL — ABNORMAL HIGH (ref 4.0–10.5)

## 2018-07-14 LAB — BLOOD GAS, ARTERIAL
Acid-Base Excess: 5 mmol/L — ABNORMAL HIGH (ref 0.0–2.0)
Bicarbonate: 28 mmol/L (ref 20.0–28.0)
Drawn by: 311011
FIO2: 40
MECHVT: 460 mL
O2 SAT: 96.2 %
PATIENT TEMPERATURE: 98.6
PCO2 ART: 34 mmHg (ref 32.0–48.0)
PEEP: 5 cmH2O
PO2 ART: 78.6 mmHg — AB (ref 83.0–108.0)
RATE: 26 resp/min
pH, Arterial: 7.526 — ABNORMAL HIGH (ref 7.350–7.450)

## 2018-07-14 LAB — PREPARE FRESH FROZEN PLASMA: UNIT DIVISION: 0

## 2018-07-14 LAB — GLUCOSE, CAPILLARY
GLUCOSE-CAPILLARY: 128 mg/dL — AB (ref 70–99)
GLUCOSE-CAPILLARY: 125 mg/dL — AB (ref 70–99)
GLUCOSE-CAPILLARY: 118 mg/dL — AB (ref 70–99)
GLUCOSE-CAPILLARY: 164 mg/dL — AB (ref 70–99)
GLUCOSE-CAPILLARY: 144 mg/dL — AB (ref 70–99)
Glucose-Capillary: 136 mg/dL — ABNORMAL HIGH (ref 70–99)
Glucose-Capillary: 122 mg/dL — ABNORMAL HIGH (ref 70–99)

## 2018-07-14 LAB — HEMOGLOBIN AND HEMATOCRIT, BLOOD
HCT: 32.8 % — ABNORMAL LOW (ref 36.0–46.0)
HCT: 30.9 % — ABNORMAL LOW (ref 36.0–46.0)
HEMATOCRIT: 29.1 % — AB (ref 36.0–46.0)
HEMATOCRIT: 29.6 % — AB (ref 36.0–46.0)
HEMATOCRIT: 27.8 % — AB (ref 36.0–46.0)
HEMOGLOBIN: 9.3 g/dL — AB (ref 12.0–15.0)
Hemoglobin: 10.6 g/dL — ABNORMAL LOW (ref 12.0–15.0)
Hemoglobin: 9.3 g/dL — ABNORMAL LOW (ref 12.0–15.0)
Hemoglobin: 9.6 g/dL — ABNORMAL LOW (ref 12.0–15.0)
Hemoglobin: 8.8 g/dL — ABNORMAL LOW (ref 12.0–15.0)

## 2018-07-14 LAB — BPAM FFP
Blood Product Expiration Date: 201908022359
Blood Product Expiration Date: 201908022359
ISSUE DATE / TIME: 201907282335
ISSUE DATE / TIME: 201907290108
UNIT TYPE AND RH: 5100
UNIT TYPE AND RH: 5100

## 2018-07-14 LAB — PROCALCITONIN: Procalcitonin: 34.83 ng/mL

## 2018-07-14 SURGERY — LAPAROTOMY, EXPLORATORY
Anesthesia: General | Site: Abdomen

## 2018-07-14 MED ORDER — PROPOFOL 10 MG/ML IV BOLUS
INTRAVENOUS | Status: AC
  Filled 2018-07-14: qty 20

## 2018-07-14 MED ORDER — ALBUTEROL SULFATE (2.5 MG/3ML) 0.083% IN NEBU: 2.5000 mg | INHALATION_SOLUTION | RESPIRATORY_TRACT | Status: DC | PRN

## 2018-07-14 MED ORDER — MIDAZOLAM HCL 2 MG/2ML IJ SOLN
INTRAMUSCULAR | Status: AC
  Filled 2018-07-14: qty 2

## 2018-07-14 MED ORDER — MIDAZOLAM HCL 5 MG/5ML IJ SOLN
INTRAMUSCULAR | Status: DC | PRN
  Administered 2018-07-14: 2 mg via INTRAVENOUS

## 2018-07-14 MED ORDER — HEMOSTATIC AGENTS (NO CHARGE) OPTIME
TOPICAL | Status: DC | PRN
  Administered 2018-07-14: 1 via TOPICAL

## 2018-07-14 MED ORDER — FENTANYL CITRATE (PF) 250 MCG/5ML IJ SOLN
INTRAMUSCULAR | Status: AC
  Filled 2018-07-14: qty 5

## 2018-07-14 MED ORDER — ROCURONIUM BROMIDE 10 MG/ML (PF) SYRINGE
PREFILLED_SYRINGE | INTRAVENOUS | Status: DC | PRN
  Administered 2018-07-14: 50 mg via INTRAVENOUS

## 2018-07-14 MED ORDER — LACTATED RINGERS IV SOLN
INTRAVENOUS | Status: DC | PRN
  Administered 2018-07-14: 10:00:00 via INTRAVENOUS

## 2018-07-14 MED ORDER — PHENYLEPHRINE 40 MCG/ML (10ML) SYRINGE FOR IV PUSH (FOR BLOOD PRESSURE SUPPORT)
PREFILLED_SYRINGE | INTRAVENOUS | Status: DC | PRN
  Administered 2018-07-14: 120 ug via INTRAVENOUS

## 2018-07-14 MED ORDER — 0.9 % SODIUM CHLORIDE (POUR BTL) OPTIME
TOPICAL | Status: DC | PRN
  Administered 2018-07-14 (×3): 1000 mL

## 2018-07-14 SURGICAL SUPPLY — 62 items
BLADE CLIPPER SURG (BLADE) IMPLANT
CANISTER SUCT 3000ML PPV (MISCELLANEOUS) ×3 IMPLANT
CANISTER WOUND CARE 500ML ATS (WOUND CARE) ×2 IMPLANT
CHLORAPREP W/TINT 26ML (MISCELLANEOUS) ×1 IMPLANT
COVER SURGICAL LIGHT HANDLE (MISCELLANEOUS) ×3 IMPLANT
DRAPE LAPAROSCOPIC ABDOMINAL (DRAPES) ×1 IMPLANT
DRAPE WARM FLUID 44X44 (DRAPE) ×3 IMPLANT
DRSG OPSITE POSTOP 4X10 (GAUZE/BANDAGES/DRESSINGS) IMPLANT
DRSG OPSITE POSTOP 4X8 (GAUZE/BANDAGES/DRESSINGS) IMPLANT
DRSG VAC ATS LRG SENSATRAC (GAUZE/BANDAGES/DRESSINGS) ×2 IMPLANT
ELECT BLADE 6.5 EXT (BLADE) IMPLANT
ELECT CAUTERY BLADE 6.4 (BLADE) ×3 IMPLANT
ELECT REM PT RETURN 9FT ADLT (ELECTROSURGICAL) ×3
ELECTRODE REM PT RTRN 9FT ADLT (ELECTROSURGICAL) ×1 IMPLANT
GLOVE BIO SURGEON STRL SZ 6.5 (GLOVE) ×1 IMPLANT
GLOVE BIO SURGEON STRL SZ8 (GLOVE) ×6 IMPLANT
GLOVE BIO SURGEONS STRL SZ 6.5 (GLOVE) ×1
GLOVE BIOGEL PI IND STRL 7.0 (GLOVE) IMPLANT
GLOVE BIOGEL PI IND STRL 8 (GLOVE) ×2 IMPLANT
GLOVE BIOGEL PI INDICATOR 7.0 (GLOVE) ×6
GLOVE BIOGEL PI INDICATOR 8 (GLOVE) ×4
GLOVE SS BIOGEL STRL SZ 7 (GLOVE) IMPLANT
GLOVE SUPERSENSE BIOGEL SZ 7 (GLOVE) ×2
GLOVE SURG SS PI 6.5 STRL IVOR (GLOVE) ×2 IMPLANT
GOWN STRL REUS W/ TWL LRG LVL3 (GOWN DISPOSABLE) ×6 IMPLANT
GOWN STRL REUS W/ TWL XL LVL3 (GOWN DISPOSABLE) ×1 IMPLANT
GOWN STRL REUS W/TWL LRG LVL3 (GOWN DISPOSABLE) ×12
GOWN STRL REUS W/TWL XL LVL3 (GOWN DISPOSABLE) ×3
KIT BASIN OR (CUSTOM PROCEDURE TRAY) ×1 IMPLANT
KIT OSTOMY DRAINABLE 2.75 STR (WOUND CARE) ×2 IMPLANT
KIT TURNOVER KIT B (KITS) ×3 IMPLANT
LIGASURE IMPACT 36 18CM CVD LR (INSTRUMENTS) ×2 IMPLANT
NS IRRIG 1000ML POUR BTL (IV SOLUTION) ×8 IMPLANT
PACK COLON (CUSTOM PROCEDURE TRAY) ×3 IMPLANT
PACK GENERAL/GYN (CUSTOM PROCEDURE TRAY) ×1 IMPLANT
PAD ARMBOARD 7.5X6 YLW CONV (MISCELLANEOUS) ×3 IMPLANT
PENCIL BUTTON HOLSTER BLD 10FT (ELECTRODE) ×1 IMPLANT
SPECIMEN JAR LARGE (MISCELLANEOUS) ×2 IMPLANT
SPECIMEN JAR X LARGE (MISCELLANEOUS) ×1 IMPLANT
SPONGE LAP 18X18 X RAY DECT (DISPOSABLE) IMPLANT
STAPLER VISISTAT 35W (STAPLE) ×1 IMPLANT
SUCTION POOLE TIP (SUCTIONS) ×3 IMPLANT
SURGILUBE 2OZ TUBE FLIPTOP (MISCELLANEOUS) IMPLANT
SUT MON AB 3-0 SH 27 (SUTURE)
SUT MON AB 3-0 SH27 (SUTURE) IMPLANT
SUT NOVA 1 T20/GS 25DT (SUTURE) ×4 IMPLANT
SUT PDS AB 1 CTX 36 (SUTURE) IMPLANT
SUT PDS AB 1 TP1 96 (SUTURE) ×6 IMPLANT
SUT PROLENE 2 0 CT2 30 (SUTURE) IMPLANT
SUT PROLENE 2 0 KS (SUTURE) IMPLANT
SUT VIC AB 2-0 SH 18 (SUTURE) ×3 IMPLANT
SUT VIC AB 3-0 SH 18 (SUTURE) ×5 IMPLANT
SUT VICRYL AB 2 0 TIES (SUTURE) ×5 IMPLANT
SUT VICRYL AB 3 0 TIES (SUTURE) ×3 IMPLANT
TOWEL OR 17X24 6PK STRL BLUE (TOWEL DISPOSABLE) ×1 IMPLANT
TOWEL OR 17X26 10 PK STRL BLUE (TOWEL DISPOSABLE) ×3 IMPLANT
TRAY FOLEY MTR SLVR 14FR STAT (SET/KITS/TRAYS/PACK) IMPLANT
TRAY FOLEY MTR SLVR 16FR STAT (SET/KITS/TRAYS/PACK) IMPLANT
TRAY PROCTOSCOPIC FIBER OPTIC (SET/KITS/TRAYS/PACK) IMPLANT
TUBE CONNECTING 12'X1/4 (SUCTIONS) ×1
TUBE CONNECTING 12X1/4 (SUCTIONS) ×3 IMPLANT
YANKAUER SUCT BULB TIP NO VENT (SUCTIONS) IMPLANT

## 2018-07-14 NOTE — Progress Notes (Signed)
Patients arterial line came out this AM.  RT attempted to replace a new one before patent goes to OR however unsuccessful.  Was unable to get a pulse with a doppler.  NP notified and RN instructed to inform OR team that arterial line was out.  No complications noted.  Will continue to monitor.

## 2018-07-14 NOTE — Progress Notes (Signed)
PULMONARY / CRITICAL CARE MEDICINE   Name: Brandi [name] MRN: [id number] DOB: [birth year]    ADMISSION DATE:  06/23/2018 CONSULTATION DATE: 06/29/2018  REFERRING MD: Dr. Bonner Puna  CHIEF COMPLAINT: Altered mental status  HISTORY OF PRESENT ILLNESS:  62 y/o F who presented to [facility] on 7/9 with abdominal pain, nausea, vomiting, decreased oral intake and weakness.    She was reportedly evaluated by her PCP prior to admit with elevated LFT's (AST 302, ALT 611, AP 380).  Follow up CT of the abdomen demonstrated prominent caudate lobe and surface irregularity of the liver.   In the ER, US of the abdomen confirmed the CT findings of the liver.  She was noted to be lethargic and have asterixis on exam.  Ammonia 22 (7/10) > 38 (7/12).  The patient had AFwRVR and was treated with cardizem.  She was admitted for further evaluation.  The patient was seen by Cardiology and transitioned to metoprolol for rate control due to decreased LVEF (30-35%). Labs showed a microcytic anemia with ferritin of 9.  GI was consulted for evaluation with recommendations for lactulose and eventual endoscopic evaluation.  She had ongoing encephalopathy and a CT of the head was obtained which showed a right frontal operculum CVA.  Neurology evaluated the patient and felt the stroke was likely related to AF but not the cause of her mental status.  She was recommended anticoagulation and passed a swallow evaluation converting from heparin to eliquis.  On 7/15, her confusion, tardive dyskinesia symptoms and lethargy worsened as well as her LFT's (AST 3942, ALT 1976, ALK Phos 404), renal function and WBC.  PCCM consulted for evaluation.  Anticoagulation stopped due to shock liver.  PSY meds were held due to severe tardive dyskinesia and liver dysfunction.  She had prolonged intubation due to poor mental status.  The patient was extubated on 7/25 without difficulty.  She was transitioned to [facility] on 7/26.  She developed agitated delirium overnight  7/26 and precedex was initiated by [facility]Link.  PCCM called back 7/28 to assess patient as she had coffee ground emesis overnight, concern for aspiration, hypotension that responded to 516m saline bolus x2.  As day progressed on 7/28, she had progressive abdominal pain.  CT of the abd/pelvis demonstrated bowel perforation.  She was intubated, had brief arrest post intubation (9 min) and was taken to the OR.  Returned to ICU on epi, neo, vaso, levo, bicarb.  Pressor need improved overnight into 7/29.    SUBJECTIVE:  Went to OR for abd wound fascia closure this am, ileostomy Remains on norepi 10, vasopressin   VITAL SIGNS: BP 136/75   Pulse 87   Temp 99.5 F (37.5 C) (Oral)   Resp (!) 26   Ht '5\' 2"'  (1.575 m)   Wt 80.8 kg (178 lb 2.1 oz)   SpO2 99%   BMI 32.58 kg/m   HEMODYNAMICS: CVP:  [1 mmHg-27 mmHg] 1 mmHg  VENTILATOR SETTINGS: Vent Mode: PRVC FiO2 (%):  [40 %] 40 % Set Rate:  [26 bmp] 26 bmp Vt Set:  [460 mL] 460 mL PEEP:  [5 cmH20] 5 cmH20 Plateau Pressure:  [16 cmH20-18 cmH20] 18 cmH20  INTAKE / OUTPUT:  Intake/Output Summary (Last 24 hours) at 07/14/2018 1248 Last data filed at 07/14/2018 1211 Gross per 24 hour  Intake 4642.15 ml  Output 3057.5 ml  Net 1584.65 ml    PHYSICAL EXAMINATION: General:  Ill appearing, sedated HEENT: ETT and GT in place Neuro: sedated, not responding CV: regular,  no M PULM: coarse B BS, no wheeze GI: wound vac in place, ileostomy Extremities: 2+ generalized edema Skin:  cool  LABS:  BMET Recent Labs  Lab 07/13/18 0949 07/13/18 1542 07/14/18 0658  NA 144 147* 145  K 4.1 3.8 3.3*  CL 108 108 107  CO2 '23 27 28  ' BUN 33* 32* 32*  CREATININE 1.20* 1.19* 1.16*  GLUCOSE 118* 123* 156*    Electrolytes Recent Labs  Lab 07/12/18 2012 07/13/18 0533 07/13/18 0700 07/13/18 0949 07/13/18 1542 07/14/18 0658  CALCIUM 7.6* 7.5* 7.7* 7.5* 7.6* 7.5*  MG 1.7 2.5* 2.2  --   --   --   PHOS 3.4  --   --   --   --   --     CBC Recent  Labs  Lab 07/12/18 2012  07/13/18 0431  07/13/18 2026 07/14/18 0119 07/14/18 0551  WBC 1.5*  --  5.2  --   --   --  18.9*  HGB 4.8*   < > 12.5   < > 11.0* 10.6* 10.4*  HCT 16.5*   < > 40.8   < > 34.1* 32.8* 33.0*  PLT 67*  --  70*  --   --   --  40*   < > = values in this interval not displayed.    Coag's Recent Labs  Lab 07/12/18 1106 07/12/18 2012 07/13/18 0649  INR 1.64 2.16 1.74    Sepsis Markers Recent Labs  Lab 07/12/18 2012 07/13/18 0125 07/13/18 0700 07/13/18 0949 07/13/18 1542 07/14/18 0658  LATICACIDVEN 8.4* 8.7*  --  6.0* 4.9*  --   PROCALCITON 21.93  --  55.17  --   --  34.83    ABG Recent Labs  Lab 07/12/18 2003 07/13/18 1010 07/14/18 0335  PHART 7.255* 7.471* 7.526*  PCO2ART 38.8 30.7* 34.0  PO2ART 66.0* 101 78.6*    Liver Enzymes Recent Labs  Lab 07/13/18 0533 07/13/18 0700 07/14/18 0658  AST 155* 103* 67*  ALT 75* 88* 83*  ALKPHOS 72 78 107  BILITOT 5.7* 5.2* 6.3*  ALBUMIN 2.4* 2.5* 1.9*    Cardiac Enzymes Recent Labs  Lab 07/12/18 1755 07/12/18 2012 07/12/18 2323  TROPONINI 0.07* 0.07* 0.07*    Glucose Recent Labs  Lab 07/13/18 1541 07/13/18 1926 07/13/18 2309 07/14/18 0322 07/14/18 0755 07/14/18 1232  GLUCAP 124* 131* 128* 125* 136* 122*    Imaging  STUDIES:  [place] ABD 7/6 >> possible cirrhosis, gallbladder surgically absent CT Head 7/9 >> atrophy with small vessel chronic ischemic changes of deep cerebral white matter CT Renal Study 7/9 >> no acute findings, bilateral non-obstructing renal stones ECHO 7/9 >> LV mildly dilated, wall thickness was increased in a pattern of moderate LVH, LVEF 30-35%, severe hypokinesis of the anteroseptal, anterior, inferoseptal & apical myocardium, mild to moderate MV regurgitation, LA severely dilated, RV moderately dilated, RA mod-severely dilated, PA pressure 36mHg, trivial pericardial effusion. CTA Head, Neck 7/12 >> minimal calcified plaque at the carotid bifurcation regions  but no stenosis or irregularity, no large vessel occlusions, unable to specifically identify the missing branch vessel responsible for the right posterior frontal infarction but this is presumably an occluded M3 or distal branch CT Head w/o 7/12 >> the small right middle cerebral artery distribution, posterolateral frontal lobe infarct has evolved since the prior CT, it is now seen as a well defined area of hypoattenuation, no new areas of infarct, no ICH CT head 7/15 >> Redemonstration of right posterior frontal lobe infarct.  No acute intracranial hemorrhage or new infarct identified.  Atrophy with chronic small vessel ischemia. CT abd/pelvis 7/18 > Diffuse dilation of the colon with large amount of ascending colonic stool.  CT ABD/Pelvis 7/28 >> free air in the abd consistent with bowel rupture, unable to identify source, bilateral pleural effusions (stable), non-obstructive kidney stones  CULTURES: BCx2 7/10 >> negative  Cdiff 7/20 >> negative  Sputum 7/20 >> negative  ANTIBIOTICS: Vanco 7/15 >> 7/21 Zoysn 7/15 >> 7/22 Unasyn 7/28 >> 7/28 Diflucan 7/28 >>  Zosyn 7/28 >>  Vanco 7/28 >>  SIGNIFICANT EVENTS: 7/09  Admit  7/15  PCCM consulted with AMS, acute liver failure 7/19  Had been receiving fluid challenges for hypotension. 7/20   Early in the a.m. hours drop blood pressure.  Placed on pressors.  Non-anion gap acidosis noted.  Cortisol 29.2 CBC remains elevated blood cell count remains elevated.  7/21  no significant fever.  White cell count cut in half.  Chest x-ray looks improved.  We resumed lactulose and rifaximin yesterday, also resumed spironolactone.  Still obtunded  7/22  Free water increased 7/23  800 ml in flexiseal overnight.  Off home PSY meds. Mg/K low.  Na 153 on free water. Continues to have tardive dyskinesia.  7/24 Tmax 100.2.  I/O- positive 8.6L for admit / 2125 of stool out 7/23. 7/26  extubated on 7/25. Remains encephalopathic. NA still elevated  7/28  PCCM  called back for hypotension on precedex, coffee ground emesis. Concern for aspiration, hypotension that responded to 544m saline bolus x2.  TF held overnight with nausea/abd pain.  NGT placed due to vomiting. ABD pain increased > CT abd with bowel perforation > to OR for resection/wash out   LINES/TUBES: ETT 7/15 >> 7/25 ETT 7/28 >>  L Rad Aline 7/28 >>  R IJ dual lumen 7/28 >>  DISCUSSION: 62y/o F admitted with weakness, decreased intake and lethargy.  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  She developed AMS > small infarct thought not related to AMS.  Recommended for anticoagulation by Cardiology/Neurology, was transitioned from heparin to eliquis.  The patient deteriorated on 7/15 with a rise in LFT's, AKI, confusion/AMS, worsening of tardive dyskinesia type symptoms.  Transferred to ICU.    ICU course significant for slow improvement of mental status (she actually looks the best I have seen her on 7/28).  She remains on room air with no distress.  She has had periods of agitation which I suspect are related to being off her maintenance PSY regimen (due to liver dysfunction). She has been seen by PSY (see recommendations).  Her electrolytes have difficult to manage in the setting of high stool output & poor nutritional status. She has been on free water and dextrose via IV. She pulled her flexiseal out multiple times overnight 7/28.  ABD pain worsened 7/28 during the day > CT ABD with perforation, to OR with Dr. BBarry Dienes    ASSESSMENT / PLAN:  Resolved issues: AKI Acute respiratory failure in setting of neurological dysfunction  Ileus Volume overload  Bradycardia - into the 20's 7/23 am, on 524mBID of lopressor, dose reduced   Active issues   Acute Hypoxic Respiratory Failure - in setting of bowel perforation, shock Bilateral Pleural Effusions - suspect in setting of liver disease, HF, AKI P: PRVC 8 cc/kg, Decreased respiratory rate 18 given respiratory  alkalosis Follow ABG, chest x-ray Continue antibiotics as above   Acute metabolic & hepatic encephalopathy / acute delirium -  improved CVA - Right Frontal Operculum infarct - felt related to AF/embolic but not all cause of mental status Right-sided weakness Seizure disorder Schizophrenia Tardive Dyskinesia - improved 7/28  P:  Psych medications are on hold, psychiatry has recommended hold Effexor, continue Abilify and BuSpar once able to do so. Continue Keppra, Vimpat  HFrEF - LVEF 30% AF with RVR Hypotension - 7/28, suspect related to intravascular volume depletion, sedation Hx HTN  -cardiology saw, [facility] held d/t liver dysfxn P: Continue to hold anticoagulation postoperatively.  Will need to address safe restart at some point once stable from this process Wean norepinephrine.  Plan to stop vasopressin when less than 10 Stress dose steroids as ordered   AGMA - in setting of bowel perforation, shock  Fluid and Electrolyte imbalance: Hypernatremia / Hyperchloremia, Hypokalemia  P: Sodium bicarbonate infusion completed LR 100 cc/h Follow BMP, urine output Replace electrolytes as indicated  [place] Cirrhosis with Hepatic Encephalopathy  - unclear in what precipitated change in events LFTs/ decompensation 7/15 - 7/15 US liver w/doppler with no evidence of portal venous thrombosis  -LFTs improved P: LFTs have stabilized, follow intermittently Unable to restart lactulose at this time.  May be necessary to do so PR as we go forward.  We will make this decision based on her mental status.  Perforated Viscus  At Risk Abscess Development  - mid descending colon perforation  P: Appreciate general surgery management. Wound VAC care, ileostomy care  Anemia  Thrombocytopenia  At Risk DIC  P: Trend CBC  No anticoagulation  Leukocytosis, questionable sepsis however etiology unclear.   - Antibiotics + antifungal restarted 7/28 with abd perforation  P: Vancomycin, Zosyn, fluconazole  as ordered Follow culture data Follow CBC  Coagulopathy Thrombocytopenia - s/p 3 units FFP 7/16 am -Most recent INR was on 7/22: 1.65 P: Follow intermittent INR No current evidence of bleeding, follow clinically  Coffee Ground Emesis - new 7/28 Vomiting P: Pepcid as ordered  DM 2 P: Sliding scale insulin, CBG  Hx Breast Cancer P: Follow up as outpatient  DVT prophylaxis: SCD's SUP: H2 BID Diet: NPO Activity: BR Disposition : ICU, critically ill   FAMILY  - Updates: Plans discussed with family at bedside 7/30   Independent CC time 33 minutes  Baltazar Apo, MD, PhD 07/14/2018, 1:01 PM [facility] [phone] or if no answer [phone]

## 2018-07-14 NOTE — Transfer of Care (Signed)
Immediate Anesthesia Transfer of Care Note  Patient: Brandi [name]  Procedure(s) Performed: EXPLORATORY LAPAROTOMY (N/A Abdomen) TRANSVERSE COLON RESECTION (N/A Abdomen) CREATION OF ILEOSTOMY (N/A Abdomen) APPLICATION OF WOUND VAC (change) (N/A Abdomen)  Patient Location: ICU  Anesthesia Type:General  Level of Consciousness: Patient remains intubated per anesthesia plan  Airway & Oxygen Therapy: Patient remains intubated per anesthesia plan and Patient placed on Ventilator (see vital sign flow sheet for setting)  Post-op Assessment: Report given to RN and Post -op Vital signs reviewed and stable  Post vital signs: Reviewed and stable  Last Vitals:  Vitals Value Taken Time  BP 136/75 07/14/2018 12:30 PM  Temp    Pulse 87 07/14/2018 12:31 PM  Resp 26 07/14/2018 12:31 PM  SpO2 98 % 07/14/2018 12:31 PM  Vitals shown include unvalidated device data.  Last Pain:  Vitals:   07/14/18 0749  TempSrc: Oral  PainSc:       Patients Stated Pain Goal: 0 (81/01/75 1025)  Complications: No apparent anesthesia complications

## 2018-07-14 NOTE — Op Note (Signed)
Preoperative diagnosis: Open abdomen with history of partial colectomy status post exporter laparotomy for sepsis  Postoperative diagnosis: Same  Procedure: Partial colectomy with closure of abdomen and creation of end ileostomy  Surgeon: Erroll Luna, MD  Assistant: OR staff  Anesthesia: General  EBL: 75 cc  Specimen: Transverse colon to pathology  Drains: None  IV fluids: Per anesthesia record  Indications for procedure: The patient is a 62 year old female who is 2 days status post emergent exploratory laparotomy by Dr. Barry Dienes for colonic perforation and sepsis.  Due to her hemodynamic instability, her abdomen was left open and packed and she was resuscitated in the ICU.  She is stabilized and returns today for second look laparotomy with possible closure abdomen and creation of ileostomy.  She remains hemodynamically stable but intubated and critically ill.  Prognosis is poor given her multiple medical comorbidities and this was discussed with the family as well as potential long-term outcomes.  They wish to proceed since she is remained stable over the last 48 hours.The procedure has been discussed with the patient.  Alternative therapies have been discussed with the patient.  Operative risks include bleeding,  Infection,  Organ injury,  Nerve injury,  Blood vessel injury,  DVT,  Pulmonary embolism,  Death,  And possible reoperation.  Medical management risks include worsening of present situation.  The success of the procedure is 50 -90 % at treating patients symptoms.  The patient understands and agrees to proceed.   Description of procedure: The patient was brought from the ICU intubated to the operating room.  She was placed supine upon the operating room table.  After adequate level of anesthesia was initiated the wound VAC that was in place was removed.  She was prepped and draped in sterile fashion.  She was already on preoperative antibiotics.  Timeout was done.  Retractor was  placed.  Laparotomy was performed.  Small bowel was normal.  Ileum was identified across staple line.  Identified her transverse colon.  Identified her Hartman's pouch in the left lower quadrant.  Her left colon was absent  And right colon were absent.  She had a closed   loop and she was not fit for anastomosis therefore resected the transverse colon.  LigaSure was used for this and the transverse colon was resected without difficulty and passed off the field.   The abdominal cavity was irrigated.  Hemostasis achieved.  Through a right lower quadrant circular incision the small bowel was brought through this as a end ileostomy.  The fascia was closed with double-stranded PDS and interrupted #1 Novafil suture.  The subcu tissues irrigated.  Ileostomy matured with 3-0 Vicryl.  Black foam vacuum pack dressing placed for the subcu tissues and appliance applied.  All final counts found to be correct.  The patient remained intubated was taken back to the ICU in critical but stable condition.  All counts were correct.

## 2018-07-14 NOTE — Anesthesia Preprocedure Evaluation (Signed)
Anesthesia Evaluation  Patient identified by MRN, date of birth, ID band Patient unresponsive    Reviewed: Unable to perform ROS - Chart review onlyPreop documentation limited or incomplete due to emergent nature of procedure.  Airway Mallampati: Intubated       Dental   Pulmonary    breath sounds clear to auscultation       Cardiovascular hypertension, + Peripheral Vascular Disease and +CHF   Rhythm:Regular     Neuro/Psych  Headaches, Seizures -,  PSYCHIATRIC DISORDERS Depression Schizophrenia CVA    GI/Hepatic Neg liver ROS, GERD  ,  Endo/Other  diabetesMorbid obesity  Renal/GU      Musculoskeletal  (+) Arthritis ,   Abdominal   Peds  Hematology  (+) anemia ,   Anesthesia Other Findings Ef 30-35%  Reproductive/Obstetrics                             Anesthesia Physical Anesthesia Plan  ASA: IV and emergent  Anesthesia Plan: General   Post-op Pain Management:    Induction: Intravenous  PONV Risk Score and Plan: 3 and Treatment may vary due to age or medical condition  Airway Management Planned: Oral ETT  Additional Equipment: Arterial line and CVP  Intra-op Plan:   Post-operative Plan: Post-operative intubation/ventilation  Informed Consent:   History available from chart only and Only emergency history available  Plan Discussed with: Surgeon and CRNA  Anesthesia Plan Comments:         Anesthesia Quick Evaluation

## 2018-07-14 NOTE — Progress Notes (Signed)
RT note: ventilator respiratory rate decreased per MD order.  Respiratory rate currently set at 18.

## 2018-07-14 NOTE — H&P (View-Only) (Signed)
2 Days Post-Op   Subjective/Chief Complaint: Pt on vent awake    Objective: Vital signs in last 24 hours: Temp:  [98.4 F (36.9 C)-99.9 F (37.7 C)] 99.5 F (37.5 C) (07/30 0749) Pulse Rate:  [93-111] 108 (07/30 0749) Resp:  [0-30] 26 (07/30 0749) BP: (97-120)/(60-78) 97/78 (07/30 0749) SpO2:  [86 %-100 %] 97 % (07/30 0749) Arterial Line BP: (48-139)/(37-84) 139/81 (07/30 0700) FiO2 (%):  [40 %-50 %] 40 % (07/30 0749) Last BM Date: 07/13/18  Intake/Output from previous day: 07/29 0701 - 07/30 0700 In: 5101.5 [I.V.:4361.5; IV Piggyback:740] Out: 3052.5 [Urine:1052.5; Drains:2000] Intake/Output this shift: No intake/output data recorded.  Incision/Wound:vac IN PLACE   Lab Results:  Recent Labs    07/13/18 0431  07/14/18 0119 07/14/18 0551  WBC 5.2  --   --  18.9*  HGB 12.5   < > 10.6* 10.4*  HCT 40.8   < > 32.8* 33.0*  PLT 70*  --   --  40*   < > = values in this interval not displayed.   BMET Recent Labs    07/13/18 1542 07/14/18 0658  NA 147* 145  K 3.8 3.3*  CL 108 107  CO2 27 28  GLUCOSE 123* 156*  BUN 32* 32*  CREATININE 1.19* 1.16*  CALCIUM 7.6* 7.5*   PT/INR Recent Labs    07/12/18 2012 07/13/18 0649  LABPROT 23.9* 20.2*  INR 2.16 1.74   ABG Recent Labs    07/13/18 1010 07/14/18 0335  PHART 7.471* 7.526*  HCO3 22.1 28.0    Studies/Results: Ct Abdomen Pelvis Wo Contrast  Result Date: 07/12/2018 CLINICAL DATA:  Coffee ground emesis. Concern for aspiration. Nausea and abdominal pain. EXAM: CT ABDOMEN AND PELVIS WITHOUT CONTRAST TECHNIQUE: Multidetector CT imaging of the abdomen and pelvis was performed following the standard protocol without IV contrast. COMPARISON:  July 02, 2018. FINDINGS: Lower chest: Bilateral pleural effusions with underlying opacities, possibly atelectasis, are similar since the July 02, 2018 study. Lung bases are otherwise unchanged. Hepatobiliary: Previous cholecystectomy. The liver is otherwise unremarkable.  Pancreas: Unremarkable. No pancreatic ductal dilatation or surrounding inflammatory changes. Spleen: Normal in size without focal abnormality. Adrenals/Urinary Tract: Nonobstructive stones are seen in both kidneys. No ureterectasis. The bladder is decompressed with a Foley catheter. Stomach/Bowel: A feeding tube terminates in the proximal jejunum. An NG tube terminates in the distal stomach. The stomach, small bowel, and colon are poorly assessed without contrast. The stomach is grossly unremarkable. There is no small bowel obstruction. No definitive abnormal loops of small bowel. There is a tube in the rectum. There is fluid in the rectum. No definitive colonic abnormalities noted. The appendix is not seen but there is no secondary evidence of appendicitis. Vascular/Lymphatic: Minimal atherosclerotic changes in the nonaneurysmal aorta. No adenopathy. Reproductive: Uterus and bilateral adnexa are unremarkable. Other: There is free air in the abdomen. The source of free air is unclear. There is also ascites in the abdomen, particularly around the liver and in the pelvis, similar in the interval. Musculoskeletal: No acute or significant osseous findings. IMPRESSION: 1. There is free air in the abdomen consistent bowel rupture. A specific source is not identified. 2. Bilateral pleural effusions with underlying opacities are stable since July 02, 2018. The underlying opacity is likely atelectasis. 3. Nonobstructive stones in the kidneys. 4. Atherosclerotic changes in the nonaneurysmal aorta. Findings discussed with the patient's nurse, April. The ICU physician was in the room at the time of the discussion and is aware. Electronically Signed  By: Dorise Bullion III M.D   On: 07/12/2018 14:47   Dg Chest Port 1 View  Result Date: 07/13/2018 CLINICAL DATA:  Vomiting, acute respiratory failure, atrial fibrillation, intubated patient. EXAM: PORTABLE CHEST 1 VIEW COMPARISON:  For chest x-ray of July 12, 2018. FINDINGS:  The lungs are reasonably well inflated. There is increased density at both lung bases greatest on the right which is obscuring the hemidiaphragms. This is more conspicuous than on yesterday's study. The cardiac silhouette is enlarged. The pulmonary vascularity is mildly engorged. The endotracheal tube tip projects approximately 1.5 cm above the carina. The esophagogastric tubes proximal port projects at or just below the left hemidiaphragm. The feeding tube tip is not visible on the study. The right-sided PICC line tip and the right internal jugular venous catheter's tip project over the junction of the proximal and midportions of the SVC. IMPRESSION: Slight improved aeration of both lungs. Abnormal interstitial density in both lungs inferiorly. Probable posterior layering pleural effusions. Mild cardiac enlargement and central pulmonary vascular congestion. The support tubes are in position as described. Electronically Signed   By: David  Martinique M.D.   On: 07/13/2018 07:18   Dg Chest Port 1 View  Result Date: 07/12/2018 CLINICAL DATA:  Check endotracheal tube placement EXAM: PORTABLE CHEST 1 VIEW COMPARISON:  Film from earlier in the same day FINDINGS: Endotracheal tube is been withdrawn and now lies 1 cm above the carina. PICC line, feeding catheter and nasogastric catheter are noted in satisfactory position. Cardiac shadow is stable. Elevation of the right hemidiaphragm is again seen. No new focal abnormality is noted. IMPRESSION: Interval repositioning of the endotracheal tube into the distal trachea. No new focal abnormality is noted. Electronically Signed   By: Inez Catalina M.D.   On: 07/12/2018 15:55   Dg Chest Portable 1 View  Result Date: 07/12/2018 CLINICAL DATA:  Check nasogastric catheter placement EXAM: PORTABLE CHEST 1 VIEW COMPARISON:  07/12/2018 FINDINGS: Nasogastric catheter has been withdrawn and readvanced into the stomach. Feeding catheter is again noted within the stomach. The  endotracheal tube is seen and lies within the right mainstem bronchus and should be withdrawn 2-3 cm. Right-sided PICC line is noted in satisfactory position. Some mild increased density in the left lung is noted likely related to incomplete expansion due to the right mainstem intubation. IMPRESSION: Endotracheal tube within the right mainstem bronchus. Some associated increased density within the under expanded left lung is noted. Nasogastric catheter has been repositioned into the stomach. Electronically Signed   By: Inez Catalina M.D.   On: 07/12/2018 15:52    Anti-infectives: Anti-infectives (From admission, onward)   Start     Dose/Rate Route Frequency Ordered Stop   07/14/18 0600  ceFAZolin (ANCEF) IVPB 2g/100 mL premix     2 g 200 mL/hr over 30 Minutes Intravenous On call to O.R. 07/13/18 2234 07/15/18 0559   07/13/18 2100  fluconazole (DIFLUCAN) IVPB 200 mg     200 mg 100 mL/hr over 60 Minutes Intravenous Every 24 hours 07/12/18 1958     07/13/18 1800  vancomycin (VANCOCIN) IVPB 1000 mg/200 mL premix     1,000 mg 200 mL/hr over 60 Minutes Intravenous Every 24 hours 07/12/18 1549     07/13/18 0100  piperacillin-tazobactam (ZOSYN) IVPB 3.375 g     3.375 g 12.5 mL/hr over 240 Minutes Intravenous Every 8 hours 07/12/18 1549     07/12/18 2100  fluconazole (DIFLUCAN) IVPB 400 mg     400 mg 100 mL/hr over  120 Minutes Intravenous  Once 07/12/18 1958 07/12/18 2306   07/12/18 1600  vancomycin (VANCOCIN) 1,500 mg in sodium chloride 0.9 % 500 mL IVPB     1,500 mg 250 mL/hr over 120 Minutes Intravenous STAT 07/12/18 1549 07/12/18 1829   07/12/18 1600  piperacillin-tazobactam (ZOSYN) IVPB 3.375 g     3.375 g 100 mL/hr over 30 Minutes Intravenous STAT 07/12/18 1549 07/12/18 1701   07/12/18 0800  ampicillin-sulbactam (UNASYN) 1.5 g in sodium chloride 0.9 % 100 mL IVPB  Status:  Discontinued     1.5 g 200 mL/hr over 30 Minutes Intravenous Every 6 hours 07/12/18 0715 07/12/18 1538   07/04/18 1000   rifaximin (XIFAXAN) tablet 550 mg  Status:  Discontinued     550 mg Per Tube 3 times daily 07/04/18 0925 07/12/18 1647   06/30/18 2200  vancomycin (VANCOCIN) IVPB 1000 mg/200 mL premix  Status:  Discontinued     1,000 mg 200 mL/hr over 60 Minutes Intravenous Every 24 hours 06/30/18 2008 07/04/18 0925   06/30/18 2200  piperacillin-tazobactam (ZOSYN) IVPB 3.375 g  Status:  Discontinued     3.375 g 12.5 mL/hr over 240 Minutes Intravenous Every 8 hours 06/30/18 2011 07/06/18 1149   06/30/18 0000  vancomycin variable dose per unstable renal function (pharmacist dosing)  Status:  Discontinued      Does not apply See admin instructions 06/29/18 1229 07/03/18 0747   06/29/18 1300  piperacillin-tazobactam (ZOSYN) IVPB 2.25 g  Status:  Discontinued     2.25 g 100 mL/hr over 30 Minutes Intravenous Every 8 hours 06/29/18 1229 06/30/18 2011   06/29/18 1300  vancomycin (VANCOCIN) 1,250 mg in sodium chloride 0.9 % 250 mL IVPB     1,250 mg 166.7 mL/hr over 90 Minutes Intravenous  Once 06/29/18 1229 06/29/18 2234      Assessment/Plan: s/p Procedure(s): EXPLORATORY LAPAROTOMY FOR FREE AIR (N/A) RETURN TO OR FOR SECOND LOOK HOPEFULLY CLOSE ABDOMEN  AND MATURE ILEOSTOMY  The procedure has been discussed with the patient.  Alternative therapies have been discussed with the patient.  Operative risks include bleeding,  Infection,  Organ injury,  Nerve injury,  Blood vessel injury,  DVT,  Pulmonary embolism,  Death,  And possible reoperation.  Medical management risks include worsening of present situation.  The success of the procedure is 50 -90 % at treating patients symptoms.  The patient understands and agrees to proceed.   CONDITION CRITICAL BUT MORE STABLE  LONG TERM OUTLOOK UNKNOWN   LOS: 21 days    Marcello Moores A Zuhayr Deeney 07/14/2018

## 2018-07-14 NOTE — Progress Notes (Signed)
RT note: was told in report that patient is to be going to OR this AM.  Will hold on daily SBT.

## 2018-07-14 NOTE — Progress Notes (Signed)
2 Days Post-Op   Subjective/Chief Complaint: Pt on vent awake    Objective: Vital signs in last 24 hours: Temp:  [98.4 F (36.9 C)-99.9 F (37.7 C)] 99.5 F (37.5 C) (07/30 0749) Pulse Rate:  [93-111] 108 (07/30 0749) Resp:  [0-30] 26 (07/30 0749) BP: (97-120)/(60-78) 97/78 (07/30 0749) SpO2:  [86 %-100 %] 97 % (07/30 0749) Arterial Line BP: (48-139)/(37-84) 139/81 (07/30 0700) FiO2 (%):  [40 %-50 %] 40 % (07/30 0749) Last BM Date: 07/13/18  Intake/Output from previous day: 07/29 0701 - 07/30 0700 In: 5101.5 [I.V.:4361.5; IV Piggyback:740] Out: 3052.5 [Urine:1052.5; Drains:2000] Intake/Output this shift: No intake/output data recorded.  Incision/Wound:vac IN PLACE   Lab Results:  Recent Labs    07/13/18 0431  07/14/18 0119 07/14/18 0551  WBC 5.2  --   --  18.9*  HGB 12.5   < > 10.6* 10.4*  HCT 40.8   < > 32.8* 33.0*  PLT 70*  --   --  40*   < > = values in this interval not displayed.   BMET Recent Labs    07/13/18 1542 07/14/18 0658  NA 147* 145  K 3.8 3.3*  CL 108 107  CO2 27 28  GLUCOSE 123* 156*  BUN 32* 32*  CREATININE 1.19* 1.16*  CALCIUM 7.6* 7.5*   PT/INR Recent Labs    07/12/18 2012 07/13/18 0649  LABPROT 23.9* 20.2*  INR 2.16 1.74   ABG Recent Labs    07/13/18 1010 07/14/18 0335  PHART 7.471* 7.526*  HCO3 22.1 28.0    Studies/Results: Ct Abdomen Pelvis Wo Contrast  Result Date: 07/12/2018 CLINICAL DATA:  Coffee ground emesis. Concern for aspiration. Nausea and abdominal pain. EXAM: CT ABDOMEN AND PELVIS WITHOUT CONTRAST TECHNIQUE: Multidetector CT imaging of the abdomen and pelvis was performed following the standard protocol without IV contrast. COMPARISON:  July 02, 2018. FINDINGS: Lower chest: Bilateral pleural effusions with underlying opacities, possibly atelectasis, are similar since the July 02, 2018 study. Lung bases are otherwise unchanged. Hepatobiliary: Previous cholecystectomy. The liver is otherwise unremarkable.  Pancreas: Unremarkable. No pancreatic ductal dilatation or surrounding inflammatory changes. Spleen: Normal in size without focal abnormality. Adrenals/Urinary Tract: Nonobstructive stones are seen in both kidneys. No ureterectasis. The bladder is decompressed with a Foley catheter. Stomach/Bowel: A feeding tube terminates in the proximal jejunum. An NG tube terminates in the distal stomach. The stomach, small bowel, and colon are poorly assessed without contrast. The stomach is grossly unremarkable. There is no small bowel obstruction. No definitive abnormal loops of small bowel. There is a tube in the rectum. There is fluid in the rectum. No definitive colonic abnormalities noted. The appendix is not seen but there is no secondary evidence of appendicitis. Vascular/Lymphatic: Minimal atherosclerotic changes in the nonaneurysmal aorta. No adenopathy. Reproductive: Uterus and bilateral adnexa are unremarkable. Other: There is free air in the abdomen. The source of free air is unclear. There is also ascites in the abdomen, particularly around the liver and in the pelvis, similar in the interval. Musculoskeletal: No acute or significant osseous findings. IMPRESSION: 1. There is free air in the abdomen consistent bowel rupture. A specific source is not identified. 2. Bilateral pleural effusions with underlying opacities are stable since July 02, 2018. The underlying opacity is likely atelectasis. 3. Nonobstructive stones in the kidneys. 4. Atherosclerotic changes in the nonaneurysmal aorta. Findings discussed with the patient's nurse, April. The ICU physician was in the room at the time of the discussion and is aware. Electronically Signed  By: Dorise Bullion III M.D   On: 07/12/2018 14:47   Dg Chest Port 1 View  Result Date: 07/13/2018 CLINICAL DATA:  Vomiting, acute respiratory failure, atrial fibrillation, intubated patient. EXAM: PORTABLE CHEST 1 VIEW COMPARISON:  For chest x-ray of July 12, 2018. FINDINGS:  The lungs are reasonably well inflated. There is increased density at both lung bases greatest on the right which is obscuring the hemidiaphragms. This is more conspicuous than on yesterday's study. The cardiac silhouette is enlarged. The pulmonary vascularity is mildly engorged. The endotracheal tube tip projects approximately 1.5 cm above the carina. The esophagogastric tubes proximal port projects at or just below the left hemidiaphragm. The feeding tube tip is not visible on the study. The right-sided PICC line tip and the right internal jugular venous catheter's tip project over the junction of the proximal and midportions of the SVC. IMPRESSION: Slight improved aeration of both lungs. Abnormal interstitial density in both lungs inferiorly. Probable posterior layering pleural effusions. Mild cardiac enlargement and central pulmonary vascular congestion. The support tubes are in position as described. Electronically Signed   By: David  Martinique M.D.   On: 07/13/2018 07:18   Dg Chest Port 1 View  Result Date: 07/12/2018 CLINICAL DATA:  Check endotracheal tube placement EXAM: PORTABLE CHEST 1 VIEW COMPARISON:  Film from earlier in the same day FINDINGS: Endotracheal tube is been withdrawn and now lies 1 cm above the carina. PICC line, feeding catheter and nasogastric catheter are noted in satisfactory position. Cardiac shadow is stable. Elevation of the right hemidiaphragm is again seen. No new focal abnormality is noted. IMPRESSION: Interval repositioning of the endotracheal tube into the distal trachea. No new focal abnormality is noted. Electronically Signed   By: Inez Catalina M.D.   On: 07/12/2018 15:55   Dg Chest Portable 1 View  Result Date: 07/12/2018 CLINICAL DATA:  Check nasogastric catheter placement EXAM: PORTABLE CHEST 1 VIEW COMPARISON:  07/12/2018 FINDINGS: Nasogastric catheter has been withdrawn and readvanced into the stomach. Feeding catheter is again noted within the stomach. The  endotracheal tube is seen and lies within the right mainstem bronchus and should be withdrawn 2-3 cm. Right-sided PICC line is noted in satisfactory position. Some mild increased density in the left lung is noted likely related to incomplete expansion due to the right mainstem intubation. IMPRESSION: Endotracheal tube within the right mainstem bronchus. Some associated increased density within the under expanded left lung is noted. Nasogastric catheter has been repositioned into the stomach. Electronically Signed   By: Inez Catalina M.D.   On: 07/12/2018 15:52    Anti-infectives: Anti-infectives (From admission, onward)   Start     Dose/Rate Route Frequency Ordered Stop   07/14/18 0600  ceFAZolin (ANCEF) IVPB 2g/100 mL premix     2 g 200 mL/hr over 30 Minutes Intravenous On call to O.R. 07/13/18 2234 07/15/18 0559   07/13/18 2100  fluconazole (DIFLUCAN) IVPB 200 mg     200 mg 100 mL/hr over 60 Minutes Intravenous Every 24 hours 07/12/18 1958     07/13/18 1800  vancomycin (VANCOCIN) IVPB 1000 mg/200 mL premix     1,000 mg 200 mL/hr over 60 Minutes Intravenous Every 24 hours 07/12/18 1549     07/13/18 0100  piperacillin-tazobactam (ZOSYN) IVPB 3.375 g     3.375 g 12.5 mL/hr over 240 Minutes Intravenous Every 8 hours 07/12/18 1549     07/12/18 2100  fluconazole (DIFLUCAN) IVPB 400 mg     400 mg 100 mL/hr over  120 Minutes Intravenous  Once 07/12/18 1958 07/12/18 2306   07/12/18 1600  vancomycin (VANCOCIN) 1,500 mg in sodium chloride 0.9 % 500 mL IVPB     1,500 mg 250 mL/hr over 120 Minutes Intravenous STAT 07/12/18 1549 07/12/18 1829   07/12/18 1600  piperacillin-tazobactam (ZOSYN) IVPB 3.375 g     3.375 g 100 mL/hr over 30 Minutes Intravenous STAT 07/12/18 1549 07/12/18 1701   07/12/18 0800  ampicillin-sulbactam (UNASYN) 1.5 g in sodium chloride 0.9 % 100 mL IVPB  Status:  Discontinued     1.5 g 200 mL/hr over 30 Minutes Intravenous Every 6 hours 07/12/18 0715 07/12/18 1538   07/04/18 1000   rifaximin (XIFAXAN) tablet 550 mg  Status:  Discontinued     550 mg Per Tube 3 times daily 07/04/18 0925 07/12/18 1647   06/30/18 2200  vancomycin (VANCOCIN) IVPB 1000 mg/200 mL premix  Status:  Discontinued     1,000 mg 200 mL/hr over 60 Minutes Intravenous Every 24 hours 06/30/18 2008 07/04/18 0925   06/30/18 2200  piperacillin-tazobactam (ZOSYN) IVPB 3.375 g  Status:  Discontinued     3.375 g 12.5 mL/hr over 240 Minutes Intravenous Every 8 hours 06/30/18 2011 07/06/18 1149   06/30/18 0000  vancomycin variable dose per unstable renal function (pharmacist dosing)  Status:  Discontinued      Does not apply See admin instructions 06/29/18 1229 07/03/18 0747   06/29/18 1300  piperacillin-tazobactam (ZOSYN) IVPB 2.25 g  Status:  Discontinued     2.25 g 100 mL/hr over 30 Minutes Intravenous Every 8 hours 06/29/18 1229 06/30/18 2011   06/29/18 1300  vancomycin (VANCOCIN) 1,250 mg in sodium chloride 0.9 % 250 mL IVPB     1,250 mg 166.7 mL/hr over 90 Minutes Intravenous  Once 06/29/18 1229 06/29/18 2234      Assessment/Plan: s/p Procedure(s): EXPLORATORY LAPAROTOMY FOR FREE AIR (N/A) RETURN TO OR FOR SECOND LOOK HOPEFULLY CLOSE ABDOMEN  AND MATURE ILEOSTOMY  The procedure has been discussed with the patient.  Alternative therapies have been discussed with the patient.  Operative risks include bleeding,  Infection,  Organ injury,  Nerve injury,  Blood vessel injury,  DVT,  Pulmonary embolism,  Death,  And possible reoperation.  Medical management risks include worsening of present situation.  The success of the procedure is 50 -90 % at treating patients symptoms.  The patient understands and agrees to proceed.   CONDITION CRITICAL BUT MORE STABLE  LONG TERM OUTLOOK UNKNOWN   LOS: 21 days    Marcello Moores A Maythe Deramo 07/14/2018

## 2018-07-14 NOTE — Anesthesia Postprocedure Evaluation (Signed)
Anesthesia Post Note  Patient: KAITYLN KALLSTROM  Procedure(s) Performed: EXPLORATORY LAPAROTOMY (N/A Abdomen) TRANSVERSE COLON RESECTION (N/A Abdomen) CREATION OF ILEOSTOMY (N/A Abdomen) APPLICATION OF WOUND VAC (change) (N/A Abdomen)     Patient location during evaluation: PACU Anesthesia Type: General Level of consciousness: awake and alert Pain management: pain level controlled Vital Signs Assessment: post-procedure vital signs reviewed and stable Respiratory status: spontaneous breathing, nonlabored ventilation, respiratory function stable and patient connected to nasal cannula oxygen Cardiovascular status: blood pressure returned to baseline and stable Postop Assessment: no apparent nausea or vomiting Anesthetic complications: no    Last Vitals:  Vitals:   07/14/18 1000 07/14/18 1232  BP: (!) 165/87 136/75  Pulse: 100 87  Resp: (!) 26 (!) 26  Temp:  36.6 C  SpO2: 96% 99%    Last Pain:  Vitals:   07/14/18 1232  TempSrc: Oral  PainSc:                  Effie Berkshire

## 2018-07-14 NOTE — Progress Notes (Signed)
Patient to OR at this time

## 2018-07-14 NOTE — Interval H&P Note (Signed)
History and Physical Interval Note:  07/14/2018 9:35 AM  Brandi [name]  has presented today for surgery, with the diagnosis of SEPTIC SHOCK AND PERFORATED VISCUS  The various methods of treatment have been discussed with the patient and family. After consideration of risks, benefits and other options for treatment, the patient has consented to  Procedure(s): EXPLORATORY LAPAROTOMY (N/A) POSSIBLE COLON RESECTION AND POSSIBLE  ABDOMINAL CLOSURE (N/A) CREATION OF ILEOSTOMY (N/A) as a surgical intervention .  The patient's history has been reviewed, patient examined, no change in status, stable for surgery.  I have reviewed the patient's chart and labs.  Questions were answered to the patient's satisfaction.     Plain

## 2018-07-15 ENCOUNTER — Encounter (HOSPITAL_COMMUNITY): Payer: Self-pay | Admitting: Surgery

## 2018-07-15 ENCOUNTER — Inpatient Hospital Stay (HOSPITAL_COMMUNITY): Payer: Medicare Other

## 2018-07-15 LAB — HEMOGLOBIN AND HEMATOCRIT, BLOOD
HCT: 28.7 % — ABNORMAL LOW (ref 36.0–46.0)
HCT: 31.5 % — ABNORMAL LOW (ref 36.0–46.0)
HEMATOCRIT: 27 % — AB (ref 36.0–46.0)
HEMATOCRIT: 27.8 % — AB (ref 36.0–46.0)
HEMOGLOBIN: 9.1 g/dL — AB (ref 12.0–15.0)
HEMOGLOBIN: 8.3 g/dL — AB (ref 12.0–15.0)
HEMOGLOBIN: 8.3 g/dL — AB (ref 12.0–15.0)
Hemoglobin: 9.7 g/dL — ABNORMAL LOW (ref 12.0–15.0)

## 2018-07-15 LAB — CBC
HCT: 29.9 % — ABNORMAL LOW (ref 36.0–46.0)
HEMOGLOBIN: 9.4 g/dL — AB (ref 12.0–15.0)
MCH: 25.1 pg — AB (ref 26.0–34.0)
MCHC: 31.4 g/dL (ref 30.0–36.0)
MCV: 79.7 fL (ref 78.0–100.0)
Platelets: 32 10*3/uL — ABNORMAL LOW (ref 150–400)
RBC: 3.75 MIL/uL — AB (ref 3.87–5.11)
RDW: 27.3 % — ABNORMAL HIGH (ref 11.5–15.5)
WBC: 17 10*3/uL — AB (ref 4.0–10.5)

## 2018-07-15 LAB — GLUCOSE, CAPILLARY
GLUCOSE-CAPILLARY: 139 mg/dL — AB (ref 70–99)
GLUCOSE-CAPILLARY: 104 mg/dL — AB (ref 70–99)
GLUCOSE-CAPILLARY: 100 mg/dL — AB (ref 70–99)
GLUCOSE-CAPILLARY: 95 mg/dL (ref 70–99)
Glucose-Capillary: 138 mg/dL — ABNORMAL HIGH (ref 70–99)
Glucose-Capillary: 112 mg/dL — ABNORMAL HIGH (ref 70–99)

## 2018-07-15 LAB — BASIC METABOLIC PANEL
ANION GAP: 8 (ref 5–15)
BUN: 37 mg/dL — ABNORMAL HIGH (ref 8–23)
CHLORIDE: 107 mmol/L (ref 98–111)
CO2: 29 mmol/L (ref 22–32)
Calcium: 7.4 mg/dL — ABNORMAL LOW (ref 8.9–10.3)
Creatinine, Ser: 1.11 mg/dL — ABNORMAL HIGH (ref 0.44–1.00)
GFR calc non Af Amer: 52 mL/min — ABNORMAL LOW (ref >60)
Glucose, Bld: 160 mg/dL — ABNORMAL HIGH (ref 70–99)
POTASSIUM: 3.4 mmol/L — AB (ref 3.5–5.1)
Sodium: 144 mmol/L (ref 135–145)

## 2018-07-15 LAB — CULTURE, RESPIRATORY W GRAM STAIN

## 2018-07-15 LAB — CULTURE, RESPIRATORY

## 2018-07-15 LAB — PROTIME-INR
INR: 1.58
Prothrombin Time: 18.8 seconds — ABNORMAL HIGH (ref 11.4–15.2)

## 2018-07-15 LAB — MAGNESIUM: Magnesium: 1.8 mg/dL (ref 1.7–2.4)

## 2018-07-15 LAB — FIBRINOGEN: Fibrinogen: 448 mg/dL (ref 210–475)

## 2018-07-15 MED ORDER — DILTIAZEM HCL-DEXTROSE 100-5 MG/100ML-% IV SOLN (PREMIX)
5.0000 mg/h | INTRAVENOUS | Status: DC
  Administered 2018-07-15 – 2018-07-19 (×2): 5 mg/h via INTRAVENOUS
  Administered 2018-07-20: 15 mg/h via INTRAVENOUS
  Administered 2018-07-20: 10 mg/h via INTRAVENOUS
  Administered 2018-07-20: 17:00:00 via INTRAVENOUS
  Administered 2018-07-21: 10 mg/h via INTRAVENOUS
  Administered 2018-07-21: 5 mg/h via INTRAVENOUS
  Administered 2018-07-22: 15 mg/h via INTRAVENOUS
  Administered 2018-07-22: 10 mg/h via INTRAVENOUS
  Administered 2018-07-23: 15 mg/h via INTRAVENOUS
  Administered 2018-07-23: 10 mg/h via INTRAVENOUS
  Filled 2018-07-15 (×12): qty 100

## 2018-07-15 MED ORDER — LORAZEPAM 2 MG/ML IJ SOLN: 2.0000 mg | INTRAMUSCULAR | Status: DC | PRN

## 2018-07-15 MED ORDER — INSULIN ASPART 100 UNIT/ML ~~LOC~~ SOLN
0.0000 [IU] | SUBCUTANEOUS | Status: DC
  Administered 2018-07-16: 2 [IU] via SUBCUTANEOUS
  Administered 2018-07-16: 3 [IU] via SUBCUTANEOUS
  Administered 2018-07-17: 5 [IU] via SUBCUTANEOUS

## 2018-07-15 NOTE — Progress Notes (Signed)
PULMONARY / CRITICAL CARE MEDICINE   Name: Brandi [name] MRN: [id number] DOB: [birth year]    ADMISSION DATE:  06/23/2018 CONSULTATION DATE: 06/29/2018  REFERRING MD: Dr. Bonner Puna  CHIEF COMPLAINT: Altered mental status  HISTORY OF PRESENT ILLNESS:  62 y/o F who presented to [facility] on 7/9 with abdominal pain, nausea, vomiting, decreased oral intake and weakness.    She was reportedly evaluated by her PCP prior to admit with elevated LFT's (AST 302, ALT 611, AP 380).  Follow up CT of the abdomen demonstrated prominent caudate lobe and surface irregularity of the liver.   In the ER, US of the abdomen confirmed the CT findings of the liver.  She was noted to be lethargic and have asterixis on exam.  Ammonia 22 (7/10) > 38 (7/12).  The patient had AFwRVR and was treated with cardizem.  She was admitted for further evaluation.  The patient was seen by Cardiology and transitioned to metoprolol for rate control due to decreased LVEF (30-35%). Labs showed a microcytic anemia with ferritin of 9.  GI was consulted for evaluation with recommendations for lactulose and eventual endoscopic evaluation.  She had ongoing encephalopathy and a CT of the head was obtained which showed a right frontal operculum CVA.  Neurology evaluated the patient and felt the stroke was likely related to AF but not the cause of her mental status.  She was recommended anticoagulation and passed a swallow evaluation converting from heparin to eliquis.  On 7/15, her confusion, tardive dyskinesia symptoms and lethargy worsened as well as her LFT's (AST 3942, ALT 1976, ALK Phos 404), renal function and WBC.  PCCM consulted for evaluation.  Anticoagulation stopped due to shock liver.  PSY meds were held due to severe tardive dyskinesia and liver dysfunction.  She had prolonged intubation due to poor mental status.  The patient was extubated on 7/25 without difficulty.  She was transitioned to [facility] on 7/26.  She developed agitated delirium overnight  7/26 and precedex was initiated by ELink.  PCCM called back 7/28 to assess patient as she had coffee ground emesis overnight, concern for aspiration, hypotension that responded to 542m saline bolus x2.  As day progressed on 7/28, she had progressive abdominal pain.  CT of the abd/pelvis demonstrated bowel perforation.  She was intubated, had brief arrest post intubation (9 min) and was taken to the OR.  Returned to ICU on epi, neo, vaso, levo, bicarb.  Pressor need improved overnight into 7/29.    SUBJECTIVE:  Pressors are weaning currently.  VITAL SIGNS: BP (!) 139/98 (BP Location: Right Leg)   Pulse (!) 101   Temp 98.7 F (37.1 C) (Oral)   Resp 11   Ht _0  (1.575 m)   Wt 178 lb 2.1 oz (80.8 kg)   SpO2 98%   BMI 32.58 kg/m   HEMODYNAMICS: CVP:  [1 mmHg-15 mmHg] 11 mmHg  VENTILATOR SETTINGS: Vent Mode: PSV;CPAP FiO2 (%):  [40 %] 40 % Set Rate:  [18 bmp-26 bmp] 18 bmp Vt Set:  [460 mL] 460 mL PEEP:  [5 cmH20] 5 cmH20 Pressure Support:  [12 cmH20] 12 cmH20 Plateau Pressure:  [16 cmH20-18 cmH20] 16 cmH20  INTAKE / OUTPUT:  Intake/Output Summary (Last 24 hours) at 07/15/2018 0912 Last data filed at 07/15/2018 0800 Gross per 24 hour  Intake 4051.72 ml  Output 1000 ml  Net 3051.72 ml    PHYSICAL EXAMINATION: General: 62year old female who is agitated at times HEENT: Scleral edema is noted, scleral jaundice is noted.  Neuro: Cannot follow commands.  Jerking rhythmic movements at times. CV: Heart sounds are irregular PULM: even/non-labored, lungs bilaterally coarse rhonchi XI:HWTU, non-tender, bsx4 active  Extremities: warm/dry, 1+ edema  Skin: no rashes or lesions   LABS:  BMET Recent Labs  Lab 07/13/18 1542 07/14/18 0658 07/15/18 0313  NA 147* 145 144  K 3.8 3.3* 3.4*  CL 108 107 107  CO2 _0 BUN 32* 32* 37*  CREATININE 1.19* 1.16* 1.11*  GLUCOSE 123* 156* 160*    Electrolytes Recent Labs  Lab 07/12/18 2012 07/13/18 0533 07/13/18 0700   07/13/18 1542 07/14/18 0658 07/15/18 0313  CALCIUM 7.6* 7.5* 7.7*   < > 7.6* 7.5* 7.4*  MG 1.7 2.5* 2.2  --   --   --  1.8  PHOS 3.4  --   --   --   --   --   --    < > = values in this interval not displayed.    CBC Recent Labs  Lab 07/13/18 0431  07/14/18 0551  07/14/18 2259 07/15/18 0313 07/15/18 0623  WBC 5.2  --  18.9*  --   --  17.0*  --   HGB 12.5   < > 10.4*   < > 8.8* 9.4* 9.1*  HCT 40.8   < > 33.0*   < > 27.8* 29.9* 28.7*  PLT 70*  --  40*  --   --  32*  --    < > = values in this interval not displayed.    Coag's Recent Labs  Lab 07/12/18 2012 07/13/18 0649 07/15/18 0313  INR 2.16 1.74 1.58    Sepsis Markers Recent Labs  Lab 07/12/18 2012 07/13/18 0125 07/13/18 0700 07/13/18 0949 07/13/18 1542 07/14/18 0658  LATICACIDVEN 8.4* 8.7*  --  6.0* 4.9*  --   PROCALCITON 21.93  --  55.17  --   --  34.83    ABG Recent Labs  Lab 07/12/18 2003 07/13/18 1010 07/14/18 0335  PHART 7.255* 7.471* 7.526*  PCO2ART 38.8 30.7* 34.0  PO2ART 66.0* 101 78.6*    Liver Enzymes Recent Labs  Lab 07/13/18 0533 07/13/18 0700 07/14/18 0658  AST 155* 103* 67*  ALT 75* 88* 83*  ALKPHOS 72 78 107  BILITOT 5.7* 5.2* 6.3*  ALBUMIN 2.4* 2.5* 1.9*    Cardiac Enzymes Recent Labs  Lab 07/12/18 1755 07/12/18 2012 07/12/18 2323  TROPONINI 0.07* 0.07* 0.07*    Glucose Recent Labs  Lab 07/14/18 1232 07/14/18 1619 07/14/18 1928 07/14/18 2312 07/15/18 0330 07/15/18 0721  GLUCAP 122* 118* 164* 144* 139* 138*    Imaging  STUDIES:  [place] ABD 7/6 >> possible cirrhosis, gallbladder surgically absent CT Head 7/9 >> atrophy with small vessel chronic ischemic changes of deep cerebral white matter CT Renal Study 7/9 >> no acute findings, bilateral non-obstructing renal stones ECHO 7/9 >> LV mildly dilated, wall thickness was increased in a pattern of moderate LVH, LVEF 30-35%, severe hypokinesis of the anteroseptal, anterior, inferoseptal & apical myocardium, mild  to moderate MV regurgitation, LA severely dilated, RV moderately dilated, RA mod-severely dilated, PA pressure 57mHg, trivial pericardial effusion. CTA Head, Neck 7/12 >> minimal calcified plaque at the carotid bifurcation regions but no stenosis or irregularity, no large vessel occlusions, unable to specifically identify the missing branch vessel responsible for the right posterior frontal infarction but this is presumably an occluded M3 or distal branch CT Head w/o 7/12 >> the small right middle cerebral artery distribution, posterolateral frontal lobe  infarct has evolved since the prior CT, it is now seen as a well defined area of hypoattenuation, no new areas of infarct, no ICH CT head 7/15 >> Redemonstration of right posterior frontal lobe infarct. No acute intracranial hemorrhage or new infarct identified.  Atrophy with chronic small vessel ischemia. CT abd/pelvis 7/18 > Diffuse dilation of the colon with large amount of ascending colonic stool.  CT ABD/Pelvis 7/28 >> free air in the abd consistent with bowel rupture, unable to identify source, bilateral pleural effusions (stable), non-obstructive kidney stones  CULTURES: BCx2 7/10 >> negative  Cdiff 7/20 >> negative  Sputum 7/20 >> negative  ANTIBIOTICS: Vanco 7/15 >> 7/21 Zoysn 7/15 >> 7/22 Unasyn 7/28 >> 7/28 Diflucan 7/28 >>  Zosyn 7/28 >>  Vanco 7/28 >>  SIGNIFICANT EVENTS: 7/09  Admit  7/15  PCCM consulted with AMS, acute liver failure 7/19  Had been receiving fluid challenges for hypotension. 7/20   Early in the a.m. hours drop blood pressure.  Placed on pressors.  Non-anion gap acidosis noted.  Cortisol 29.2 CBC remains elevated blood cell count remains elevated.  7/21  no significant fever.  White cell count cut in half.  Chest x-ray looks improved.  We resumed lactulose and rifaximin yesterday, also resumed spironolactone.  Still obtunded  7/22  Free water increased 7/23  800 ml in flexiseal overnight.  Off home PSY meds.  Mg/K low.  Na 153 on free water. Continues to have tardive dyskinesia.  7/24 Tmax 100.2.  I/O- positive 8.6L for admit / 2125 of stool out 7/23. 7/26  extubated on 7/25. Remains encephalopathic. NA still elevated  7/28  PCCM called back for hypotension on precedex, coffee ground emesis. Concern for aspiration, hypotension that responded to 527m saline bolus x2.  TF held overnight with nausea/abd pain.  NGT placed due to vomiting. ABD pain increased > CT abd with bowel perforation > to OR for resection/wash out   LINES/TUBES: ETT 7/15 >> 7/25 ETT 7/28 >>  L Rad Aline 7/28 >>  R IJ dual lumen 7/28 >>  DISCUSSION: 62y/o F admitted with weakness, decreased intake and lethargy.  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  She developed AMS > small infarct thought not related to AMS.  Recommended for anticoagulation by Cardiology/Neurology, was transitioned from heparin to eliquis.  The patient deteriorated on 7/15 with a rise in LFT's, AKI, confusion/AMS, worsening of tardive dyskinesia type symptoms.  Transferred to ICU.    ICU course significant for slow improvement of mental status (she actually looks the best I have seen her on 7/28).  She remains on room air with no distress.  She has had periods of agitation which I suspect are related to being off her maintenance PSY regimen (due to liver dysfunction). She has been seen by PSY (see recommendations).  Her electrolytes have difficult to manage in the setting of high stool output & poor nutritional status. She has been on free water and dextrose via IV. She pulled her flexiseal out multiple times overnight 7/28.  ABD pain worsened 7/28 during the day > CT ABD with perforation, to OR with Dr. BBarry Dienes  07/15/2018 to wean her altered mental status is a block to any attempt at extubation.   ASSESSMENT / PLAN:     Active issues   Acute Hypoxic Respiratory Failure - in setting of bowel perforation, shock Bilateral Pleural Effusions -  suspect in setting of liver disease, HF, AKI P: Wean per protocol Altered mental status is  block to extubation   Acute metabolic & hepatic encephalopathy / acute delirium - improved CVA - Right Frontal Operculum infarct - felt related to AF/embolic but not all cause of mental status Right-sided weakness Seizure disorder Schizophrenia Tardive Dyskinesia - improved 7/28  P:  Continue to hold psych medication  HFrEF - LVEF 30% AF with RVR Hypotension - 7/28, suspect related to intravascular volume depletion, sedation Hx HTN   -cardiology saw, [facility] held d/t liver dysfxn P: On anticoagulation .  Rate control with Cardizem.  AGMA - in setting of bowel perforation, shock  Fluid and Electrolyte imbalance: Hypernatremia / Hyperchloremia, Hypokalemia  Recent Labs  Lab 07/13/18 1542 07/14/18 0658 07/15/18 0313  K 3.8 3.3* 3.4*    P: Replace electrolytes as needed  [place] Cirrhosis with Hepatic Encephalopathy  - unclear in what precipitated change in events LFTs/ decompensation 7/15 - 7/15 US liver w/doppler with no evidence of portal venous thrombosis  -LFTs improved P: Monitor LFTs  Perforated Viscus  At Risk Abscess Development  - mid descending colon perforation  P:  Per general surgery    Leukocytosis, questionable sepsis however etiology unclear.   - Antibiotics + antifungal restarted 7/28 with abd perforation  P: Continue current antibiotics Monitor culture data  Coagulopathy Thrombocytopenia Anemia Recent Labs    07/15/18 0313 07/15/18 0623  HGB 9.4* 9.1*  plts 32 Lab Results  Component Value Date   INR 1.58 07/15/2018   INR 1.74 07/13/2018   INR 2.16 07/12/2018     P: Trend platelet Monitor for bleeding  Coffee Ground Emesis - new 7/28 Vomiting P: Continue Pepcid  DM 2 CBG (last 3)  Recent Labs    07/14/18 2312 07/15/18 0330 07/15/18 0721  GLUCAP 144* 139* 138*    P:  Controlled sliding scale insulin  Hx Breast  Cancer P: Follow-up with outpatient  DVT prophylaxis: SCD's SUP: H2 BID Diet: NPO Activity: BR Disposition : ICU, critically ill   FAMILY  - Updates: 07/15/2018 no family at bedside at time of this dictation    CC time 30 minutes   Richardson Landry Philopater Mucha ACNP Maryanna Shape PCCM Pager [phone] till 1 pm If no answer page [phone] 07/15/2018, 9:12 AM

## 2018-07-15 NOTE — Progress Notes (Addendum)
  Addendum -Stop fluconazole, vancomycin  Harvel Quale 07/15/2018 12:25 PM    Pharmacy Antibiotic Note  LASHARN BUFKIN is a 62 y.o. female admitted on 06/23/2018 with nausea and poor oral intake, now s/p code blue with concern for perforated bowel. S/p ex lap, colon resection and ileostomy. Empirically started vancomycin, zosyn, fluconazole for abdominal perf and sepsis. Now growing klebsiella pneumonia. WBC 17, afebrile.   Plan: Fluconazole 200mg  IV q24h Vancomycin 1 g IV q24h  -- considering discontinuing Zosyn 3.375g IV q8h  Check VT tomorrow Monitor renal fx, cultures, LOT    Height: 5\' 2"  (157.5 cm) Weight: 178 lb 2.1 oz (80.8 kg) IBW/kg (Calculated) : 50.1  Temp (24hrs), Avg:98.6 F (37 C), Min:97.9 F (36.6 C), Max:98.9 F (37.2 C)  Recent Labs  Lab 07/12/18 1506 07/12/18 1507  07/12/18 2012 07/13/18 0125 07/13/18 0431  07/13/18 0700 07/13/18 0949 07/13/18 1542 07/14/18 0551 07/14/18 0658 07/15/18 0313  WBC 4.4  --   --  1.5*  --  5.2  --   --   --   --  18.9*  --  17.0*  CREATININE 1.26*  --    < > 1.10*  --   --    < > 1.25* 1.20* 1.19*  --  1.16* 1.11*  LATICACIDVEN  --  12.3*  --  8.4* 8.7*  --   --   --  6.0* 4.9*  --   --   --    < > = values in this interval not displayed.    Antimicrobials this admission: Zosyn 7/15 >> 7/22; restart 7/28 >> Vanc 7/15 >> 7/21; restart 7/28 >>  Unasyn 7/28 >> 7/28 Fluconazole 7/28 >>  Dose adjustments this admission: 7/16 VR = 10 (~ 24 hrs post 1 g) 7/19 VT = 15 on 1g q24  Microbiology results: 7/9 MRSA - negative 7/10 BCx - negative 7/15 BCx - negative 7/20 C.diff - negative 7/20 TA - negative 7/28 blood cx: ngtd 7/29 resp cx: klebsiella   Harvel Quale 07/15/2018 12:01 PM

## 2018-07-15 NOTE — Progress Notes (Signed)
1 Day Post-Op   Subjective/Chief Complaint: PT ON VENT on pressors some oozing from ileostomy    Objective: Vital signs in last 24 hours: Temp:  [97.9 F (36.6 C)-98.9 F (37.2 C)] 98.7 F (37.1 C) (07/31 0700) Pulse Rate:  [59-144] 101 (07/31 0800) Resp:  [0-26] 11 (07/31 0800) BP: (99-166)/(53-99) 139/98 (07/31 0800) SpO2:  [82 %-100 %] 98 % (07/31 0800) FiO2 (%):  [40 %] 40 % (07/31 0800) Last BM Date: 07/14/18  Intake/Output from previous day: 07/30 0701 - 07/31 0700 In: 4126.6 [I.V.:3346.9; IV Piggyback:779.7] Out: 1275 [Urine:795; Emesis/NG output:10; Drains:295; Stool:125; Blood:50] Intake/Output this shift: Total I/O In: 220.6 [I.V.:220.6] Out: 140 [Urine:40; Drains:100]  Incision/Wound:vac in place   Ileostomy viable with some oozing   Lab Results:  Recent Labs    07/14/18 0551  07/15/18 0313 07/15/18 0623  WBC 18.9*  --  17.0*  --   HGB 10.4*   < > 9.4* 9.1*  HCT 33.0*   < > 29.9* 28.7*  PLT 40*  --  32*  --    < > = values in this interval not displayed.   BMET Recent Labs    07/14/18 0658 07/15/18 0313  NA 145 144  K 3.3* 3.4*  CL 107 107  CO2 28 29  GLUCOSE 156* 160*  BUN 32* 37*  CREATININE 1.16* 1.11*  CALCIUM 7.5* 7.4*   PT/INR Recent Labs    07/13/18 0649 07/15/18 0313  LABPROT 20.2* 18.8*  INR 1.74 1.58   ABG Recent Labs    07/13/18 1010 07/14/18 0335  PHART 7.471* 7.526*  HCO3 22.1 28.0    Studies/Results: Dg Chest Port 1 View  Result Date: 07/15/2018 CLINICAL DATA:  Acute respiratory failure with hypoxia. EXAM: PORTABLE CHEST 1 VIEW COMPARISON:  Radiograph of July 14, 2018. FINDINGS: Stable cardiomediastinal silhouette. Endotracheal nasogastric tubes are unchanged in position. Right internal jugular and right-sided PICC line catheters are unchanged. Stable bibasilar atelectasis or edema is noted with associated pleural effusions. No pneumothorax is noted. Bony thorax is unremarkable. IMPRESSION: Stable support apparatus.   Stable bibasilar opacities. Electronically Signed   By: Marijo Conception, M.D.   On: 07/15/2018 07:18   Dg Chest Port 1 View  Result Date: 07/14/2018 CLINICAL DATA:  Acute respiratory failure with hypoxia. EXAM: PORTABLE CHEST 1 VIEW COMPARISON:  Radiograph July 13, 2018. FINDINGS: Stable cardiomegaly. Endotracheal and nasogastric tubes are unchanged in position. Right-sided PICC line and right internal jugular catheters are unchanged in position. Stable central pulmonary vascular congestion is noted. No pneumothorax is noted. Bibasilar edema or atelectasis is noted with associated pleural effusions. Bony thorax is unremarkable. IMPRESSION: Stable support apparatus. Stable cardiomegaly with central pulmonary vascular congestion. Stable bibasilar opacities are noted concerning for edema or atelectasis with associated pleural effusions. Electronically Signed   By: Marijo Conception, M.D.   On: 07/14/2018 09:06    Anti-infectives: Anti-infectives (From admission, onward)   Start     Dose/Rate Route Frequency Ordered Stop   07/14/18 0600  ceFAZolin (ANCEF) IVPB 2g/100 mL premix     2 g 200 mL/hr over 30 Minutes Intravenous On call to O.R. 07/13/18 2234 07/14/18 1052   07/13/18 2100  fluconazole (DIFLUCAN) IVPB 200 mg     200 mg 100 mL/hr over 60 Minutes Intravenous Every 24 hours 07/12/18 1958     07/13/18 1800  vancomycin (VANCOCIN) IVPB 1000 mg/200 mL premix     1,000 mg 200 mL/hr over 60 Minutes Intravenous Every 24 hours 07/12/18  1549     07/13/18 0100  piperacillin-tazobactam (ZOSYN) IVPB 3.375 g     3.375 g 12.5 mL/hr over 240 Minutes Intravenous Every 8 hours 07/12/18 1549     07/12/18 2100  fluconazole (DIFLUCAN) IVPB 400 mg     400 mg 100 mL/hr over 120 Minutes Intravenous  Once 07/12/18 1958 07/12/18 2306   07/12/18 1600  vancomycin (VANCOCIN) 1,500 mg in sodium chloride 0.9 % 500 mL IVPB     1,500 mg 250 mL/hr over 120 Minutes Intravenous STAT 07/12/18 1549 07/12/18 1829   07/12/18  1600  piperacillin-tazobactam (ZOSYN) IVPB 3.375 g     3.375 g 100 mL/hr over 30 Minutes Intravenous STAT 07/12/18 1549 07/12/18 1701   07/12/18 0800  ampicillin-sulbactam (UNASYN) 1.5 g in sodium chloride 0.9 % 100 mL IVPB  Status:  Discontinued     1.5 g 200 mL/hr over 30 Minutes Intravenous Every 6 hours 07/12/18 0715 07/12/18 1538   07/04/18 1000  rifaximin (XIFAXAN) tablet 550 mg  Status:  Discontinued     550 mg Per Tube 3 times daily 07/04/18 0925 07/12/18 1647   06/30/18 2200  vancomycin (VANCOCIN) IVPB 1000 mg/200 mL premix  Status:  Discontinued     1,000 mg 200 mL/hr over 60 Minutes Intravenous Every 24 hours 06/30/18 2008 07/04/18 0925   06/30/18 2200  piperacillin-tazobactam (ZOSYN) IVPB 3.375 g  Status:  Discontinued     3.375 g 12.5 mL/hr over 240 Minutes Intravenous Every 8 hours 06/30/18 2011 07/06/18 1149   06/30/18 0000  vancomycin variable dose per unstable renal function (pharmacist dosing)  Status:  Discontinued      Does not apply See admin instructions 06/29/18 1229 07/03/18 0747   06/29/18 1300  piperacillin-tazobactam (ZOSYN) IVPB 2.25 g  Status:  Discontinued     2.25 g 100 mL/hr over 30 Minutes Intravenous Every 8 hours 06/29/18 1229 06/30/18 2011   06/29/18 1300  vancomycin (VANCOCIN) 1,250 mg in sodium chloride 0.9 % 250 mL IVPB     1,250 mg 166.7 mL/hr over 90 Minutes Intravenous  Once 06/29/18 1229 06/29/18 2234      Assessment/Plan: s/p Procedure(s): EXPLORATORY LAPAROTOMY (N/Brandi) TRANSVERSE COLON RESECTION (N/Brandi) CREATION OF ILEOSTOMY (N/Brandi) APPLICATION OF WOUND VAC (change) (N/Brandi) Thrombocytopenia noted  May need plts  No feeds until pressors off Continue supportive care   LOS: 22 days    Brandi [name] Brandi [name] 07/15/2018

## 2018-07-15 NOTE — Progress Notes (Signed)
Nutrition Follow-up  DOCUMENTATION CODES:   Obesity unspecified  INTERVENTION:   TPN per Pharmacy  Recommend initiation of trickle TF of Vital AF 1.2 @ 20 ml/hr as soon as medically able via Cortrak tube   NUTRITION DIAGNOSIS:   Inadequate oral intake related to acute illness as evidenced by NPO status.  Being addressed via TPN  GOAL:   Patient will meet greater than or equal to 90% of their needs  Progressing  MONITOR:   Vent status, TF tolerance, Labs, Weight trends  REASON FOR ASSESSMENT:   Consult, Ventilator Enteral/tube feeding initiation and management  ASSESSMENT:   62 yo female admitted 7/9 with weakness, anorexia and lethargy with NASH cirrhosis with hepatic encephalopathy, new onset Afib with RVR, new onset HFrEF 30-35%, anemia. On 7/15,  pt's condition worsened with increased confusion/lethargy, tardive dyskinesia symptoms, AKI, shock liver and respiratory failure requiring intubation.  Pt with hx of HTN, HLD, seizure disorder, left breast cancer s/p lumpectomy 2008, GERD, depression, DM   7/15 Intubated, Inadequate nutrition x 7 days 7/17 Trickle TF started but did not tolerate, vomiting 7/18 CT abdomen, colonic ileus with stool in ascending colon 7/21 Trickle TF re-started 7/22 Post-pyloric Cortrak tube placed 7/25- Extubated, TF at goal 7/28- Cardiac arrest, intubated, perforated descending colon and near perforation of cecum. Taken for Ileocecectomy and partial colectomy left in discontinuity, open abdomen with wound vac 7/30 Return to OR, partial colectomy with closure of abdomen and creation of end ileostomy  Patient is currently intubated on ventilator support MV: 9.9 L/min Temp (24hrs), Avg:98.7 F (37.1 C), Min:98.6 F (37 C), Max:98.9 F (37.2 C)  Pt remains NPO, per Surgery, holding off on EN until pt off vasopressors. Pt remains on levophed at present. Limited nutrition since admission (22 days)  Paged MD regarding recommendation to  start TPN, TPN consult received.   Cortrak and NG tube remain in placed. Minimal output from NG but very dark in color  Needs re-estimated using EDW 72 kg. Weight up to 91.9 kg post-op. Net + 21 L per I/O flow sheet  Labs: potassium 3.4, CBGs 112-144, Creatinine 1.11 Meds; LR at 50 ml/hr, levophed    Diet Order:   Diet Order           Diet NPO time specified  Diet effective now          EDUCATION NEEDS:   Education needs have been addressed  Skin:  Skin Assessment: Skin Integrity Issues: Skin Integrity Issues:: Wound VAC Wound Vac: abdomen Other: MASD: buttocks, groin, perineum  Last BM:  Ileostomy: small amount of stool; rectal tube removed  Height:   Ht Readings from Last 1 Encounters:  07/04/18 5\' 2"  (1.575 m)    Weight:   Wt Readings from Last 1 Encounters:  07/15/18 202 lb 9.6 oz (91.9 kg)    Ideal Body Weight:  50 kg  BMI:  Body mass index is 37.06 kg/m.  Estimated Nutritional Needs:   Kcal:  1589 kcals  Protein:  100-125 g  Fluid:  >/= 1.5 L  Kerman Passey MS, RD, LDN, CNSC [phone] Pager  [phone] Weekend/On-Call Pager

## 2018-07-15 NOTE — Progress Notes (Signed)
Patient went into A-Fib with RVR after giving her a bath. Elink MD notified and ordered to restart Cardizem. Patient's HR is back in sinus rhythm without restarting the Cardizem drip. The drip is currently on hold and Elink MD is notified. Will continue to monitor. Brandi [name]

## 2018-07-15 NOTE — Progress Notes (Signed)
RT note: Patient placed back on full support at 10:20 AM due to decreased respiratory rate.

## 2018-07-15 NOTE — Progress Notes (Signed)
eLink Physician-Brief Progress Note Patient Name: Brandi MCAULEY DOB: Feb 28, 1956 MRN: [id number]   Date of Service  07/15/2018  HPI/Events of Note  Patient went into afib with bath  eICU Interventions  Restart cardizem infusion     Intervention Category Major Interventions: Arrhythmia - evaluation and management  Levonne Carreras 07/15/2018, 1:04 AM

## 2018-07-15 NOTE — Progress Notes (Signed)
CSW continuing to follow and assist with disposition as needed- pt still intubated and not appropriate to actively pursue SNF placement at this time  Jorge Ny, [place] Social Worker [phone]

## 2018-07-15 NOTE — Progress Notes (Signed)
Cardizem drip restarted due to patient going into A-Fib with RVR again.

## 2018-07-15 NOTE — Progress Notes (Signed)
RT note: patient placed on CPAP/PSV of 12/5 at 0742.  Patient currently tolerating well.  Will continue to monitor.

## 2018-07-15 NOTE — Consult Note (Signed)
[place] Nurse wound consult note Reason for Consult:Midline abdominal wound dressing.  NPWT in place.  Applied 07/14/18.  Will be changed tomorrow.  Supplies are ordered and at bedside.  Wound type:surgical Pressure Injury POA: NA  WOC Nurse ostomy consult note Stoma type/location: RLQ ileostomy, flush Stomal assessment/size: 7/8" round pink and dark.  No stool in pouch.  IS leaking serous fluid around stoma.  Will change pouch today. Peristomal assessment: flush stoma and excess fluid leakage, likely from surgery Treatment options for stomal/peristomal skin: barrier ring and 1 piece convex pouch Output blood tined liquid in pouch Ostomy pouching: 1pc. Convex with barrier ring Education provided: None. Patient is intubated.  Enrolled patient in [place] program: No  Woc team will follow.  Domenic Moras RN BSN [place] Pager [phone]

## 2018-07-16 ENCOUNTER — Inpatient Hospital Stay (HOSPITAL_COMMUNITY): Payer: Medicare Other

## 2018-07-16 LAB — DIFFERENTIAL
BASOS ABS: 0 10*3/uL (ref 0.0–0.1)
Basophils Relative: 0 %
Eosinophils Absolute: 0 10*3/uL (ref 0.0–0.7)
Eosinophils Relative: 0 %
Lymphocytes Relative: 4 %
Lymphs Abs: 0.6 10*3/uL — ABNORMAL LOW (ref 0.7–4.0)
Monocytes Absolute: 0.6 10*3/uL (ref 0.1–1.0)
Monocytes Relative: 4 %
Neutro Abs: 13.5 10*3/uL — ABNORMAL HIGH (ref 1.7–7.7)
Neutrophils Relative %: 92 %

## 2018-07-16 LAB — COMPREHENSIVE METABOLIC PANEL
ALK PHOS: 290 U/L — AB (ref 38–126)
ALT: 45 U/L — AB (ref 0–44)
ALT: 51 U/L — ABNORMAL HIGH (ref 0–44)
AST: 66 U/L — AB (ref 15–41)
AST: 89 U/L — ABNORMAL HIGH (ref 15–41)
Albumin: 1.9 g/dL — ABNORMAL LOW (ref 3.5–5.0)
Albumin: 2.2 g/dL — ABNORMAL LOW (ref 3.5–5.0)
Alkaline Phosphatase: 352 U/L — ABNORMAL HIGH (ref 38–126)
Anion gap: 9 (ref 5–15)
Anion gap: 15 (ref 5–15)
BUN: 46 mg/dL — AB (ref 8–23)
BUN: 49 mg/dL — ABNORMAL HIGH (ref 8–23)
CALCIUM: 7.6 mg/dL — AB (ref 8.9–10.3)
CHLORIDE: 105 mmol/L (ref 98–111)
CO2: 28 mmol/L (ref 22–32)
CO2: 25 mmol/L (ref 22–32)
CREATININE: 1.33 mg/dL — AB (ref 0.44–1.00)
Calcium: 7.9 mg/dL — ABNORMAL LOW (ref 8.9–10.3)
Chloride: 109 mmol/L (ref 98–111)
Creatinine, Ser: 1.41 mg/dL — ABNORMAL HIGH (ref 0.44–1.00)
GFR calc non Af Amer: 42 mL/min — ABNORMAL LOW (ref >60)
GFR, EST AFRICAN AMERICAN: 49 mL/min — AB (ref >60)
GFR, EST AFRICAN AMERICAN: 45 mL/min — AB (ref >60)
GFR, EST NON AFRICAN AMERICAN: 39 mL/min — AB (ref >60)
Glucose, Bld: 105 mg/dL — ABNORMAL HIGH (ref 70–99)
Glucose, Bld: 104 mg/dL — ABNORMAL HIGH (ref 70–99)
Potassium: 3.2 mmol/L — ABNORMAL LOW (ref 3.5–5.1)
Potassium: 3.9 mmol/L (ref 3.5–5.1)
SODIUM: 146 mmol/L — AB (ref 135–145)
Sodium: 145 mmol/L (ref 135–145)
Total Bilirubin: 7.6 mg/dL — ABNORMAL HIGH (ref 0.3–1.2)
Total Bilirubin: 9.5 mg/dL — ABNORMAL HIGH (ref 0.3–1.2)
Total Protein: 3.6 g/dL — ABNORMAL LOW (ref 6.5–8.1)
Total Protein: 4 g/dL — ABNORMAL LOW (ref 6.5–8.1)

## 2018-07-16 LAB — CBC WITH DIFFERENTIAL/PLATELET
BASOS PCT: 1 %
Basophils Absolute: 0.2 10*3/uL — ABNORMAL HIGH (ref 0.0–0.1)
Eosinophils Absolute: 0 10*3/uL (ref 0.0–0.7)
Eosinophils Relative: 0 %
HCT: 28.2 % — ABNORMAL LOW (ref 36.0–46.0)
Hemoglobin: 8.7 g/dL — ABNORMAL LOW (ref 12.0–15.0)
Lymphocytes Relative: 3 %
Lymphs Abs: 0.7 10*3/uL (ref 0.7–4.0)
MCH: 24.8 pg — ABNORMAL LOW (ref 26.0–34.0)
MCHC: 30.9 g/dL (ref 30.0–36.0)
MCV: 80.3 fL (ref 78.0–100.0)
Monocytes Absolute: 0.9 10*3/uL (ref 0.1–1.0)
Monocytes Relative: 4 %
Neutro Abs: 19.9 10*3/uL — ABNORMAL HIGH (ref 1.7–7.7)
Neutrophils Relative %: 92 %
PLATELETS: 44 10*3/uL — AB (ref 150–400)
RBC: 3.51 MIL/uL — ABNORMAL LOW (ref 3.87–5.11)
RDW: 28 % — ABNORMAL HIGH (ref 11.5–15.5)
WBC: 21.7 10*3/uL — ABNORMAL HIGH (ref 4.0–10.5)

## 2018-07-16 LAB — PHOSPHORUS
Phosphorus: 4.3 mg/dL (ref 2.5–4.6)
Phosphorus: 4.6 mg/dL (ref 2.5–4.6)

## 2018-07-16 LAB — CBC
HCT: 23.8 % — ABNORMAL LOW (ref 36.0–46.0)
Hemoglobin: 7.5 g/dL — ABNORMAL LOW (ref 12.0–15.0)
MCH: 24.9 pg — ABNORMAL LOW (ref 26.0–34.0)
MCHC: 31.5 g/dL (ref 30.0–36.0)
MCV: 79.1 fL (ref 78.0–100.0)
PLATELETS: 32 10*3/uL — AB (ref 150–400)
RBC: 3.01 MIL/uL — ABNORMAL LOW (ref 3.87–5.11)
RDW: 27.6 % — ABNORMAL HIGH (ref 11.5–15.5)
WBC: 14.7 10*3/uL — ABNORMAL HIGH (ref 4.0–10.5)

## 2018-07-16 LAB — MAGNESIUM
MAGNESIUM: 1.8 mg/dL (ref 1.7–2.4)
Magnesium: 1.9 mg/dL (ref 1.7–2.4)

## 2018-07-16 LAB — HEMOGLOBIN AND HEMATOCRIT, BLOOD
HCT: 25.5 % — ABNORMAL LOW (ref 36.0–46.0)
HCT: 29.1 % — ABNORMAL LOW (ref 36.0–46.0)
HCT: 31.5 % — ABNORMAL LOW (ref 36.0–46.0)
HEMATOCRIT: 29.1 % — AB (ref 36.0–46.0)
Hemoglobin: 7.9 g/dL — ABNORMAL LOW (ref 12.0–15.0)
Hemoglobin: 8.9 g/dL — ABNORMAL LOW (ref 12.0–15.0)
Hemoglobin: 8.9 g/dL — ABNORMAL LOW (ref 12.0–15.0)
Hemoglobin: 9.8 g/dL — ABNORMAL LOW (ref 12.0–15.0)

## 2018-07-16 LAB — GLUCOSE, CAPILLARY
GLUCOSE-CAPILLARY: 114 mg/dL — AB (ref 70–99)
GLUCOSE-CAPILLARY: 150 mg/dL — AB (ref 70–99)
Glucose-Capillary: 98 mg/dL (ref 70–99)
Glucose-Capillary: 111 mg/dL — ABNORMAL HIGH (ref 70–99)
Glucose-Capillary: 95 mg/dL (ref 70–99)
Glucose-Capillary: 158 mg/dL — ABNORMAL HIGH (ref 70–99)

## 2018-07-16 LAB — PREALBUMIN
Prealbumin: <5 mg/dL — ABNORMAL LOW (ref 18–38)
Prealbumin: <5 mg/dL — ABNORMAL LOW (ref 18–38)

## 2018-07-16 LAB — TRIGLYCERIDES: Triglycerides: 182 mg/dL — ABNORMAL HIGH (ref <150)

## 2018-07-16 LAB — PROTIME-INR
INR: 1.65
PROTHROMBIN TIME: 19.4 s — AB (ref 11.4–15.2)

## 2018-07-16 LAB — FIBRINOGEN: Fibrinogen: 324 mg/dL (ref 210–475)

## 2018-07-16 MED ORDER — ZINC TRACE METAL 1 MG/ML IV SOLN
INTRAVENOUS | Status: AC
  Administered 2018-07-16: 18:00:00 via INTRAVENOUS
  Filled 2018-07-16: qty 471.6

## 2018-07-16 MED ORDER — ALBUMIN HUMAN 5 % IV SOLN
25.0000 g | Freq: Once | INTRAVENOUS | Status: AC
  Administered 2018-07-16: 25 g via INTRAVENOUS
  Filled 2018-07-16: qty 250

## 2018-07-16 MED ORDER — LORAZEPAM 2 MG/ML IJ SOLN
0.5000 mg | INTRAMUSCULAR | Status: DC | PRN
  Administered 2018-07-16 – 2018-07-23 (×5): 1 mg via INTRAVENOUS
  Filled 2018-07-16 (×5): qty 1

## 2018-07-16 MED ORDER — POTASSIUM CHLORIDE 10 MEQ/50ML IV SOLN
10.0000 meq | INTRAVENOUS | Status: AC
  Administered 2018-07-16 (×3): 10 meq via INTRAVENOUS
  Filled 2018-07-16: qty 50

## 2018-07-16 MED ORDER — METOPROLOL TARTRATE 5 MG/5ML IV SOLN
5.0000 mg | Freq: Four times a day (QID) | INTRAVENOUS | Status: DC
  Administered 2018-07-16 – 2018-07-21 (×20): 5 mg via INTRAVENOUS
  Filled 2018-07-16 (×20): qty 5

## 2018-07-16 MED ORDER — HYDROCORTISONE NA SUCCINATE PF 100 MG IJ SOLR: 50.0000 mg | Freq: Two times a day (BID) | INTRAMUSCULAR | Status: DC

## 2018-07-16 MED ORDER — POTASSIUM CHLORIDE 10 MEQ/50ML IV SOLN
10.0000 meq | Freq: Once | INTRAVENOUS | Status: AC
  Administered 2018-07-16: 10 meq via INTRAVENOUS
  Filled 2018-07-16: qty 50

## 2018-07-16 MED ORDER — LACTATED RINGERS IV SOLN
INTRAVENOUS | Status: DC
  Administered 2018-07-16 (×2): via INTRAVENOUS

## 2018-07-16 NOTE — Progress Notes (Signed)
eLink Physician-Brief Progress Note Patient Name: Brandi [name] DOB: [birth year] MRN: [id number]   Date of Service  07/16/2018  HPI/Events of Note  K* 3.2  eICU Interventions  KCL 10 meq iv q 1 Hr x 3 doses        Marcianne Ozbun U [name] 07/16/2018, 5:59 AM

## 2018-07-16 NOTE — Progress Notes (Addendum)
PULMONARY / CRITICAL CARE MEDICINE   Name: Brandi [name] MRN: [id number] DOB: 11/02/1956    ADMISSION DATE:  06/23/2018 CONSULTATION DATE: 06/29/2018  REFERRING MD: Dr. Bonner Puna  CHIEF COMPLAINT: Altered mental status  HISTORY OF PRESENT ILLNESS:  62 y/o F who presented to [facility] on 7/9 with abdominal pain, nausea, vomiting, decreased oral intake and weakness.    She was reportedly evaluated by her PCP prior to admit with elevated LFT's (AST 302, ALT 611, AP 380).  Follow up CT of the abdomen demonstrated prominent caudate lobe and surface irregularity of the liver.   In the ER, US of the abdomen confirmed the CT findings of the liver.  She was noted to be lethargic and have asterixis on exam.  Ammonia 22 (7/10) > 38 (7/12).  The patient had AFwRVR and was treated with cardizem.  She was admitted for further evaluation.  The patient was seen by Cardiology and transitioned to metoprolol for rate control due to decreased LVEF (30-35%). Labs showed a microcytic anemia with ferritin of 9.  GI was consulted for evaluation with recommendations for lactulose and eventual endoscopic evaluation.  She had ongoing encephalopathy and a CT of the head was obtained which showed a right frontal operculum CVA.  Neurology evaluated the patient and felt the stroke was likely related to AF but not the cause of her mental status.  She was recommended anticoagulation and passed a swallow evaluation converting from heparin to eliquis.  On 7/15, her confusion, tardive dyskinesia symptoms and lethargy worsened as well as her LFT's (AST 3942, ALT 1976, ALK Phos 404), renal function and WBC.  PCCM consulted for evaluation.  Anticoagulation stopped due to shock liver.  PSY meds were held due to severe tardive dyskinesia and liver dysfunction.  She had prolonged intubation due to poor mental status.  The patient was extubated on 7/25 without difficulty.  She was transitioned to [facility] on 7/26.  She developed agitated delirium overnight  7/26 and precedex was initiated by ELink.  PCCM called back 7/28 to assess patient as she had coffee ground emesis overnight, concern for aspiration, hypotension that responded to 536m saline bolus x2.  As day progressed on 7/28, she had progressive abdominal pain.  CT of the abd/pelvis demonstrated bowel perforation.  She was intubated, had brief arrest post intubation (9 min) and was taken to the OR.  Returned to ICU on epi, neo, vaso, levo, bicarb.  Pressor need improved overnight into 7/29.    SUBJECTIVE:  Off pressors, off Cardizem.  VITAL SIGNS: BP (!) 102/55   Pulse 93   Temp 98.7 F (37.1 C) (Oral)   Resp 16   Ht '5\' 2"'  (1.575 m)   Wt 202 lb 9.6 oz (91.9 kg)   SpO2 97%   BMI 37.06 kg/m   HEMODYNAMICS: CVP:  [8 mmHg-13 mmHg] 10 mmHg  VENTILATOR SETTINGS: Vent Mode: PRVC FiO2 (%):  [40 %] 40 % Set Rate:  [18 bmp] 18 bmp Vt Set:  [460 mL] 460 mL PEEP:  [5 cmH20] 5 cmH20 Plateau Pressure:  [14 cmH20-22 cmH20] 18 cmH20  INTAKE / OUTPUT:  Intake/Output Summary (Last 24 hours) at 07/16/2018 1030 Last data filed at 07/16/2018 0900 Gross per 24 hour  Intake 1235.14 ml  Output 1110 ml  Net 125.14 ml    PHYSICAL EXAMINATION: General: 62year old female who has periods of agitation and startle reflex but does not follow commands. HEENT: Tracheal tube and nasogastric tube in place.  Scleral edema noted Neuro: Not  follow commands.  Startle reflex with any type of stimulation CV: Heart sounds are irregular irregular PULM: even/non-labored, lungs bilaterally managed in the bases GI: Open wound with wound VAC in place.  No bowel sounds Extremities: warm/dry, plus edema  Skin: no rashes or lesions    LABS:  BMET Recent Labs  Lab 07/15/18 0313 07/16/18 0415 07/16/18 0910  NA 144 146* 145  K 3.4* 3.2* 3.9  CL 107 109 105  CO2 '29 28 25  ' BUN 37* 46* 49*  CREATININE 1.11* 1.33* 1.41*  GLUCOSE 160* 105* 104*    Electrolytes Recent Labs  Lab 07/12/18 2012   07/15/18 0313 07/16/18 0415 07/16/18 0910  CALCIUM 7.6*   < > 7.4* 7.6* 7.9*  MG 1.7   < > 1.8 1.9 1.8  PHOS 3.4  --   --  4.3 4.6   < > = values in this interval not displayed.    CBC Recent Labs  Lab 07/15/18 0313  07/15/18 2353 07/16/18 0415 07/16/18 0910  WBC 17.0*  --   --  14.7* 21.7*  HGB 9.4*   < > 9.8* 7.5* 8.7*  HCT 29.9*   < > 31.5* 23.8* 28.2*  PLT 32*  --   --  32* 44*   < > = values in this interval not displayed.    Coag's Recent Labs  Lab 07/13/18 0649 07/15/18 0313 07/16/18 0415  INR 1.74 1.58 1.65    Sepsis Markers Recent Labs  Lab 07/12/18 2012 07/13/18 0125 07/13/18 0700 07/13/18 0949 07/13/18 1542 07/14/18 0658  LATICACIDVEN 8.4* 8.7*  --  6.0* 4.9*  --   PROCALCITON 21.93  --  55.17  --   --  34.83    ABG Recent Labs  Lab 07/12/18 2003 07/13/18 1010 07/14/18 0335  PHART 7.255* 7.471* 7.526*  PCO2ART 38.8 30.7* 34.0  PO2ART 66.0* 101 78.6*    Liver Enzymes Recent Labs  Lab 07/14/18 0658 07/16/18 0415 07/16/18 0910  AST 67* 66* 89*  ALT 83* 45* 51*  ALKPHOS 107 290* 352*  BILITOT 6.3* 7.6* 9.5*  ALBUMIN 1.9* 1.9* 2.2*    Cardiac Enzymes Recent Labs  Lab 07/12/18 1755 07/12/18 2012 07/12/18 2323  TROPONINI 0.07* 0.07* 0.07*    Glucose Recent Labs  Lab 07/15/18 1122 07/15/18 1526 07/15/18 1921 07/15/18 2323 07/16/18 0349 07/16/18 0742  GLUCAP 112* 104* 100* 95 98 111*    Imaging  STUDIES:  [place] ABD 7/6 >> possible cirrhosis, gallbladder surgically absent CT Head 7/9 >> atrophy with small vessel chronic ischemic changes of deep cerebral white matter CT Renal Study 7/9 >> no acute findings, bilateral non-obstructing renal stones ECHO 7/9 >> LV mildly dilated, wall thickness was increased in a pattern of moderate LVH, LVEF 30-35%, severe hypokinesis of the anteroseptal, anterior, inferoseptal & apical myocardium, mild to moderate MV regurgitation, LA severely dilated, RV moderately dilated, RA mod-severely  dilated, PA pressure 33mHg, trivial pericardial effusion. CTA Head, Neck 7/12 >> minimal calcified plaque at the carotid bifurcation regions but no stenosis or irregularity, no large vessel occlusions, unable to specifically identify the missing branch vessel responsible for the right posterior frontal infarction but this is presumably an occluded M3 or distal branch CT Head w/o 7/12 >> the small right middle cerebral artery distribution, posterolateral frontal lobe infarct has evolved since the prior CT, it is now seen as a well defined area of hypoattenuation, no new areas of infarct, no ICH CT head 7/15 >> Redemonstration of right posterior frontal lobe  infarct. No acute intracranial hemorrhage or new infarct identified.  Atrophy with chronic small vessel ischemia. CT abd/pelvis 7/18 > Diffuse dilation of the colon with large amount of ascending colonic stool.  CT ABD/Pelvis 7/28 >> free air in the abd consistent with bowel rupture, unable to identify source, bilateral pleural effusions (stable), non-obstructive kidney stones  CULTURES: BCx2 7/10 >> negative  Cdiff 7/20 >> negative  Sputum 7/20 >> negative Sputum 7/29>>Kleb Pne SS zoysn  ANTIBIOTICS: Vanco 7/15 >> 7/21 Zoysn 7/15 >> 7/22 Unasyn 7/28 >> 7/28 Diflucan 7/28 >> 7/31 Zosyn 7/28 >>  Vanco 7/28 >>7/31  SIGNIFICANT EVENTS: 7/09  Admit  7/15  PCCM consulted with AMS, acute liver failure 7/19  Had been receiving fluid challenges for hypotension. 7/20   Early in the a.m. hours drop blood pressure.  Placed on pressors.  Non-anion gap acidosis noted.  Cortisol 29.2 CBC remains elevated blood cell count remains elevated.  7/21  no significant fever.  White cell count cut in half.  Chest x-ray looks improved.  We resumed lactulose and rifaximin yesterday, also resumed spironolactone.  Still obtunded  7/22  Free water increased 7/23  800 ml in flexiseal overnight.  Off home PSY meds. Mg/K low.  Na 153 on free water. Continues to have  tardive dyskinesia.  7/24 Tmax 100.2.  I/O- positive 8.6L for admit / 2125 of stool out 7/23. 7/26  extubated on 7/25. Remains encephalopathic. NA still elevated  7/28  PCCM called back for hypotension on precedex, coffee ground emesis. Concern for aspiration, hypotension that responded to 534m saline bolus x2.  TF held overnight with nausea/abd pain.  NGT placed due to vomiting. ABD pain increased > CT abd with bowel perforation > to OR for resection/wash out   LINES/TUBES: ETT 7/15 >> 7/25 ETT 7/28 >>  L Rad Aline 7/28 >> out R IJ dual lumen 7/28 >>  DISCUSSION: 62y/o F admitted with weakness, decreased intake and lethargy.  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  She developed AMS > small infarct thought not related to AMS.  Recommended for anticoagulation by Cardiology/Neurology, was transitioned from heparin to eliquis.  The patient deteriorated on 7/15 with a rise in LFT's, AKI, confusion/AMS, worsening of tardive dyskinesia type symptoms.  Transferred to ICU.    ICU course significant for slow improvement of mental status (she actually looks the best I have seen her on 7/28).  She remains on room air with no distress.  She has had periods of agitation which I suspect are related to being off her maintenance PSY regimen (due to liver dysfunction). She has been seen by PSY (see recommendations).  Her electrolytes have difficult to manage in the setting of high stool output & poor nutritional status. She has been on free water and dextrose via IV. She pulled her flexiseal out multiple times overnight 7/28.  ABD pain worsened 7/28 during the day > CT ABD with perforation, to OR with Dr. BBarry Dienes  07/15/2018 to wean her altered mental status is a block to any attempt at extubation. 07/16/2018 TPN to start   ASSESSMENT / PLAN:     Active issues   Acute Hypoxic Respiratory Failure - in setting of bowel perforation, shock Bilateral Pleural Effusions - suspect in setting of  liver disease, HF, AKI Klebsiella pneumoniae in sputum P: Wean per protocol Mental status precludes extubation at this time. She may need a tracheostomy in the near future.   Acute metabolic & hepatic encephalopathy / acute delirium -  improved CVA - Right Frontal Operculum infarct - felt related to AF/embolic but not all cause of mental status Right-sided weakness Seizure disorder Schizophrenia Tardive Dyskinesia - improved 7/28  P:  07/16/2018 no significant change in mental status Continue to hold psych medication Continue seizure medication  HFrEF - LVEF 30% AF with RVR Hypotension - 7/28, suspect related to intravascular volume depletion, sedation Hx HTN   -cardiology saw, [facility] held d/t liver dysfxn P: Off anticoagulation Rate controlled currently off Cardizem  AGMA - in setting of bowel perforation, shock  Fluid and Electrolyte imbalance: Hypernatremia / Hyperchloremia, Hypokalemia  Recent Labs  Lab 07/15/18 0313 07/16/18 0415 07/16/18 0910  K 3.4* 3.2* 3.9    P: Ice electrolytes as needed, note that she is going on TPN pharmacy will be involved with monitoring electrolytes.  [place] Cirrhosis with Hepatic Encephalopathy  - unclear in what precipitated change in events LFTs/ decompensation 7/15 - 7/15 US liver w/doppler with no evidence of portal venous thrombosis  -LFTs improved P: Trend LFTs  Perforated Viscus  At Risk Abscess Development  - mid descending colon perforation  P:  Per general surgery    Leukocytosis, questionable sepsis however etiology unclear.   Klebsiella pneumoniae in sputum - Antibiotics + antifungal restarted 7/28 with abd perforation  P: Continue current antibiotics Monitor culture data  Coagulopathy Thrombocytopenia plts 32->44 Anemia Recent Labs    07/16/18 0415 07/16/18 0910  HGB 7.5* 8.7*  plts 32 Lab Results  Component Value Date   INR 1.65 07/16/2018   INR 1.58 07/15/2018   INR 1.74 07/13/2018      P: Treatment platelets currently is gone from 32-44 Monitor for hemorrhage  Coffee Ground Emesis - new 7/28 Vomiting P: Continue Pepcid  DM 2 CBG (last 3)  Recent Labs    07/15/18 2323 07/16/18 0349 07/16/18 0742  GLUCAP 95 98 111*    P:  Continue sliding scale insulin protocol  Hx Breast Cancer P: Follow-up with outpatient  DVT prophylaxis: SCD's SUP: H2 BID Diet: NPO, TPN to start 07/16/2018 Activity: BR Disposition : ICU, critically ill   FAMILY  - Updates: 07/16/2018 no family at bedside at time of this dictation    CC time 30 minutes   Richardson Landry Suzan Manon ACNP Maryanna Shape PCCM Pager [phone] till 1 pm If no answer page [phone] 07/16/2018, 10:30 AM

## 2018-07-16 NOTE — Progress Notes (Signed)
eLink Physician-Brief Progress Note Patient Name: Brandi [name] DOB: 1956-02-21 MRN: [id number]   Date of Service  07/16/2018  HPI/Events of Note  BP 86/50 MAP 61, Hemoglobin 9.8 gm, last documented serum albumin level 1.9  eICU Interventions  5 % Albumin 25 gm iv x 1        [name] [name] 07/16/2018, 1:17 AM

## 2018-07-16 NOTE — Progress Notes (Signed)
[facility] Progress Note  2 Days Post-Op  Subjective: CC: ileus Patient with post-operative ileus, not unexpected. No output from ileostomy yet. Off pressors. VAC change tomorrow. Moving extremities but not really responding to anything other than pain.   Objective: Vital signs in last 24 hours: Temp:  [98.6 F (37 C)-99.8 F (37.7 C)] 98.7 F (37.1 C) (08/01 0747) Pulse Rate:  [74-109] 93 (08/01 0743) Resp:  [7-22] 16 (08/01 0743) BP: (85-192)/(49-101) 102/55 (08/01 0743) SpO2:  [92 %-100 %] 97 % (08/01 0700) FiO2 (%):  [40 %] 40 % (08/01 0743) Weight:  [91.9 kg (202 lb 9.6 oz)] 91.9 kg (202 lb 9.6 oz) (07/31 1211) Last BM Date: 07/15/18(ostomy)  Intake/Output from previous day: 07/31 0701 - 08/01 0700 In: 1725.3 [I.V.:1429.5; NG/GT:40; IV Piggyback:255.8] Out: 1210 [Urine:360; Emesis/NG output:200; Drains:650] Intake/Output this shift: No intake/output data recorded.  PE: Gen:  Sedated on the vent Card:  tachycardic, feet edematous bilaterally  Pulm:  ventilator Abd: Soft, distended, bowel sounds hypoactive, VAC to midline incision with ascites appearing drainage, stoma red with some SS drainage in ostomy   Lab Results:  Recent Labs    07/15/18 0313  07/15/18 2353 07/16/18 0415  WBC 17.0*  --   --  14.7*  HGB 9.4*   < > 9.8* 7.5*  HCT 29.9*   < > 31.5* 23.8*  PLT 32*  --   --  32*   < > = values in this interval not displayed.   BMET Recent Labs    07/15/18 0313 07/16/18 0415  NA 144 146*  K 3.4* 3.2*  CL 107 109  CO2 29 28  GLUCOSE 160* 105*  BUN 37* 46*  CREATININE 1.11* 1.33*  CALCIUM 7.4* 7.6*   PT/INR Recent Labs    07/15/18 0313 07/16/18 0415  LABPROT 18.8* 19.4*  INR 1.58 1.65   CMP     Component Value Date/Time   NA 146 (H) 07/16/2018 0415   NA 138 06/22/2018 1350   NA 144 10/24/2014 1336   K 3.2 (L) 07/16/2018 0415   K 3.5 10/24/2014 1336   CL 109 07/16/2018 0415   CL 108 (H) 06/24/2013 0610   CL 103 10/22/2012  1343   CO2 28 07/16/2018 0415   CO2 31 (H) 10/24/2014 1336   GLUCOSE 105 (H) 07/16/2018 0415   GLUCOSE 116 10/24/2014 1336   GLUCOSE 153 (H) 10/22/2012 1343   BUN 46 (H) 07/16/2018 0415   BUN 19 06/22/2018 1350   BUN 14.9 10/24/2014 1336   CREATININE 1.33 (H) 07/16/2018 0415   CREATININE 0.77 10/22/2017 1007   CREATININE 0.8 10/24/2014 1336   CALCIUM 7.6 (L) 07/16/2018 0415   CALCIUM 10.7 (H) 10/24/2014 1336   PROT 3.6 (L) 07/16/2018 0415   PROT 5.9 (L) 06/22/2018 1350   PROT 7.6 10/24/2014 1336   ALBUMIN 1.9 (L) 07/16/2018 0415   ALBUMIN 3.8 06/22/2018 1350   ALBUMIN 4.1 10/24/2014 1336   AST 66 (H) 07/16/2018 0415   AST 21 10/24/2014 1336   ALT 45 (H) 07/16/2018 0415   ALT 31 10/24/2014 1336   ALKPHOS 290 (H) 07/16/2018 0415   ALKPHOS 138 10/24/2014 1336   BILITOT 7.6 (H) 07/16/2018 0415   BILITOT 1.1 06/22/2018 1350   BILITOT 0.44 10/24/2014 1336   GFRNONAA 42 (L) 07/16/2018 0415   GFRNONAA >60 06/24/2013 0610   GFRAA 49 (L) 07/16/2018 0415   GFRAA >60 06/24/2013 0610   Lipase     Component Value Date/Time  LIPASE 37 06/23/2018 0119       Studies/Results: Dg Chest Port 1 View  Result Date: 07/15/2018 CLINICAL DATA:  Acute respiratory failure with hypoxia. EXAM: PORTABLE CHEST 1 VIEW COMPARISON:  Radiograph of July 14, 2018. FINDINGS: Stable cardiomediastinal silhouette. Endotracheal nasogastric tubes are unchanged in position. Right internal jugular and right-sided PICC line catheters are unchanged. Stable bibasilar atelectasis or edema is noted with associated pleural effusions. No pneumothorax is noted. Bony thorax is unremarkable. IMPRESSION: Stable support apparatus.  Stable bibasilar opacities. Electronically Signed   By: Marijo Conception, M.D.   On: 07/15/2018 07:18    Anti-infectives: Anti-infectives (From admission, onward)   Start     Dose/Rate Route Frequency Ordered Stop   07/14/18 0600  ceFAZolin (ANCEF) IVPB 2g/100 mL premix     2 g 200 mL/hr over  30 Minutes Intravenous On call to O.R. 07/13/18 2234 07/14/18 1052   07/13/18 2100  fluconazole (DIFLUCAN) IVPB 200 mg  Status:  Discontinued     200 mg 100 mL/hr over 60 Minutes Intravenous Every 24 hours 07/12/18 1958 07/15/18 1224   07/13/18 1800  vancomycin (VANCOCIN) IVPB 1000 mg/200 mL premix  Status:  Discontinued     1,000 mg 200 mL/hr over 60 Minutes Intravenous Every 24 hours 07/12/18 1549 07/15/18 1224   07/13/18 0100  piperacillin-tazobactam (ZOSYN) IVPB 3.375 g     3.375 g 12.5 mL/hr over 240 Minutes Intravenous Every 8 hours 07/12/18 1549     07/12/18 2100  fluconazole (DIFLUCAN) IVPB 400 mg     400 mg 100 mL/hr over 120 Minutes Intravenous  Once 07/12/18 1958 07/12/18 2306   07/12/18 1600  vancomycin (VANCOCIN) 1,500 mg in sodium chloride 0.9 % 500 mL IVPB     1,500 mg 250 mL/hr over 120 Minutes Intravenous STAT 07/12/18 1549 07/12/18 1829   07/12/18 1600  piperacillin-tazobactam (ZOSYN) IVPB 3.375 g     3.375 g 100 mL/hr over 30 Minutes Intravenous STAT 07/12/18 1549 07/12/18 1701   07/12/18 0800  ampicillin-sulbactam (UNASYN) 1.5 g in sodium chloride 0.9 % 100 mL IVPB  Status:  Discontinued     1.5 g 200 mL/hr over 30 Minutes Intravenous Every 6 hours 07/12/18 0715 07/12/18 1538   07/04/18 1000  rifaximin (XIFAXAN) tablet 550 mg  Status:  Discontinued     550 mg Per Tube 3 times daily 07/04/18 0925 07/12/18 1647   06/30/18 2200  vancomycin (VANCOCIN) IVPB 1000 mg/200 mL premix  Status:  Discontinued     1,000 mg 200 mL/hr over 60 Minutes Intravenous Every 24 hours 06/30/18 2008 07/04/18 0925   06/30/18 2200  piperacillin-tazobactam (ZOSYN) IVPB 3.375 g  Status:  Discontinued     3.375 g 12.5 mL/hr over 240 Minutes Intravenous Every 8 hours 06/30/18 2011 07/06/18 1149   06/30/18 0000  vancomycin variable dose per unstable renal function (pharmacist dosing)  Status:  Discontinued      Does not apply See admin instructions 06/29/18 1229 07/03/18 0747   06/29/18 1300   piperacillin-tazobactam (ZOSYN) IVPB 2.25 g  Status:  Discontinued     2.25 g 100 mL/hr over 30 Minutes Intravenous Every 8 hours 06/29/18 1229 06/30/18 2011   06/29/18 1300  vancomycin (VANCOCIN) 1,250 mg in sodium chloride 0.9 % 250 mL IVPB     1,250 mg 166.7 mL/hr over 90 Minutes Intravenous  Once 06/29/18 1229 06/29/18 2234       Assessment/Plan Brandi [name] cirrhosis with hepatic encephalopathy New onset atrial fibrillation with rapid ventricular rate  New onset cardiomyopathy with EF 30 to 35% NewRight frontal operculum infarct History of seizure disorder Hypertension Type 2 diabetes well controlled History of schizophrenia  Septic shock due to perforated descending colon  - WBC 14.7, afebrile - off pressors S/p ex-lap with ileocecectomy and partial descending colectomy left in discontinuity with abdominal wound VAC placement 07/12/18 Dr. Barry Dienes - POD#5 S/p ex-lap partial colectomy with closure of abdomen and creation of end ileostomy 07/14/18 Dr. Brantley Stage - POD#2 - no output from ostomy yet - would not recommend starting TF until having at least some gas from ileostomy - recommend TPN for nutrition until return of bowel function  - 200 cc out of NGT in 24h, continue on LIWS - VAC to be changed tomorrow  FEN: IVF, start TPN; NGT to LIWS VTE: SCDs ID: current abx - Zosyn 7/29>>   LOS: 23 days    Brandi [name] , [facility] 07/16/2018, 8:16 AM Pager: [phone] Consults: [phone] Mon-Fri 7:00 am-4:30 pm Sat-Sun 7:00 am-11:30 am

## 2018-07-16 NOTE — Progress Notes (Signed)
eLink Physician-Brief Progress Note Patient Name: Brandi [name] DOB: Jul 20, 1956 MRN: [id number]   Date of Service  07/16/2018  HPI/Events of Note  Marked drop in SBP into the 80's with prn doses of Versed. Prn Fentanyl does not drop blood pressure but does not calm patient down.  eICU Interventions  Discontinue Versed. Change Ativan to 0.5-1 mg iv Q 4 hrs prn agitation and monitor for hypotension following dosing.        [name] 07/16/2018, 2:39 AM

## 2018-07-16 NOTE — Progress Notes (Signed)
PHARMACY - ADULT TOTAL PARENTERAL NUTRITION CONSULT NOTE   Pharmacy Consult for TPN Indication: Post-operative ileus  Patient Measurements: Height: _0  (157.5 cm) Weight: 202 lb 9.6 oz (91.9 kg) IBW/kg (Calculated) : 50.1 TPN AdjBW (KG): 52.8 Body mass index is 37.06 kg/m. Usual Weight: ~72 kg  Assessment: 63 year old female admitted 06/23/18 with weakness, anorexia, and lethargy in setting of NASH cirrhosis and hepatic encephalopathy with new onset Afib with RVR and HFrEF (30-35%). On 7/15, patient's clinical status worsened and increased confusion and multiorgan failure including respiratory failure requiring intubation. Patient improved and was extubated 7/25. However, patient had subsequent cardiac arrest on 7/28 resulting in perforation of descending colon and near perforation of cecum requiring OR for lleocecectomy and partial colectomy with discontinuity and open abdomen. She returned to the OR on 7/30 with creation of end ileostomy and closure of abdomen. Patient has been NPO >7 days and with limited nutrition since admission (22 days) therefore is at high risk for refeeding. Patient has Cortrak and NGT in place with minimal output from NGT. Surgery holding on restarting TF until output from ostomy.   GI: Ostomy with 0 output. NGT output 200 mL/24hr. Pre-albumin <5. Wound Vac to be changed 8/2.  Endo: Hx DM. CBGs 95 to 111 on moderate SSI q4h. On stress steroids. Insulin requirements in the past 24 hours:  Lytes: Na high 146 and K low 3.2- given 3 runs this AM. Others wnl. CoCa 9.3.  Renal: SCR up 1.33 and BUN up 46. UOP 0.2 mL/kg/hr. IVF- LR at 59m/hr.  I/O +515 mL. Net +18.6L.  Pulm: Vent, FiO2 40% Cards: Off Pressors. Afib with HR 90-low 100s; off Diltiazem drip. Required significant transfusions 7/28 when perforated colon.  Currently, Hgb down to 7.5 and Plts down to 32.  Hepatobil: AST/ALT elevated but trending down. Alk Phos rising. Tbili rising at 7.6.  Neuro: Hepatic  encephalopathy- RASS 1, GCS 8. Sedated on Fentanyl at 562m/hr. Sz history on Vimpat + Keppra.  ID: Zosyn day #4. WBC down 14.7, Afebrile. PCT up 34.8, LA down from 8.7 to 4.9 on 7/29.  TPN Access: Double-lumen PICC placed 7/26 TPN start date: 8/1 Nutritional Goals (per RD recommendation on 7/31): KCal: 1589 kcal Protein: 100-125g Fluid: >/= 1.5L   Goal TPN rate is 70 ml/hr (provides 100% of needs)  Current Nutrition:  NPO, TPN  Plan:  Initiate TPN at 3029mr due to risk of refeeding. Hold 20% lipid emulsion for first 7 days for ICU patients per ASPEN guidelines (Start date 8/8). This TPN provides 47 g of protein, 137 g of dextrose, and 0 g of lipids which provides 654 kCals per day, meeting 41% of patient needs Electrolytes in TPN: Lower Na, standard for others and monitor. Cl:Ac 1:1 for now.  Add MVI to TPN. Will not add trace elements due to elevated Tbili, but will add Zinc 5mg53my, Chromium 10 mcg/day, and Selenium 60 mcg/day. D/C Pepcid IV. Add Pepcid to TPN.  Continue moderate SSI q4h and adjust as needed Decrease IVMF (LR) to 20 ml/hr at 1800 PM.  Monitor TPN labs, BMET, Mg and Phos in AM.  F/U for output from ostomy and ability to initiate TF via Cortrak tube.   Additional KCl 10mE58m x1 today for total of 4 runs today.   JessiSloan LeiterrmD, BCPS, BCCCP Clinical Pharmacist Clinical phone 07/16/2018 until 3:30PM - #[phone] refer to AMION 07/16/2018,7:30 AM

## 2018-07-17 ENCOUNTER — Inpatient Hospital Stay (HOSPITAL_COMMUNITY): Payer: Medicare Other

## 2018-07-17 LAB — CBC WITH DIFFERENTIAL/PLATELET
BASOS ABS: 0 10*3/uL (ref 0.0–0.1)
Basophils Relative: 0 %
EOS PCT: 0 %
Eosinophils Absolute: 0 10*3/uL (ref 0.0–0.7)
HCT: 34.2 % — ABNORMAL LOW (ref 36.0–46.0)
HEMOGLOBIN: 9.4 g/dL — AB (ref 12.0–15.0)
LYMPHS ABS: 0.8 10*3/uL (ref 0.7–4.0)
LYMPHS PCT: 3 %
MCH: 24.1 pg — AB (ref 26.0–34.0)
MCHC: 27.5 g/dL — ABNORMAL LOW (ref 30.0–36.0)
MCV: 87.7 fL (ref 78.0–100.0)
MONOS PCT: 5 %
Monocytes Absolute: 1.3 10*3/uL — ABNORMAL HIGH (ref 0.1–1.0)
NEUTROS PCT: 92 %
Neutro Abs: 23.2 10*3/uL — ABNORMAL HIGH (ref 1.7–7.7)
PLATELETS: 73 10*3/uL — AB (ref 150–400)
RBC: 3.9 MIL/uL (ref 3.87–5.11)
RDW: 34.5 % — ABNORMAL HIGH (ref 11.5–15.5)
WBC: 25.3 10*3/uL — ABNORMAL HIGH (ref 4.0–10.5)

## 2018-07-17 LAB — HEMOGLOBIN AND HEMATOCRIT, BLOOD
HCT: 29.8 % — ABNORMAL LOW (ref 36.0–46.0)
HCT: 29.1 % — ABNORMAL LOW (ref 36.0–46.0)
HEMOGLOBIN: 9.3 g/dL — AB (ref 12.0–15.0)
Hemoglobin: 8.9 g/dL — ABNORMAL LOW (ref 12.0–15.0)

## 2018-07-17 LAB — GLUCOSE, CAPILLARY
GLUCOSE-CAPILLARY: 162 mg/dL — AB (ref 70–99)
Glucose-Capillary: 206 mg/dL — ABNORMAL HIGH (ref 70–99)
Glucose-Capillary: 197 mg/dL — ABNORMAL HIGH (ref 70–99)
Glucose-Capillary: 193 mg/dL — ABNORMAL HIGH (ref 70–99)
Glucose-Capillary: 197 mg/dL — ABNORMAL HIGH (ref 70–99)

## 2018-07-17 LAB — BASIC METABOLIC PANEL
Anion gap: 12 (ref 5–15)
BUN: 55 mg/dL — AB (ref 8–23)
CO2: 26 mmol/L (ref 22–32)
CREATININE: 1.4 mg/dL — AB (ref 0.44–1.00)
Calcium: 8 mg/dL — ABNORMAL LOW (ref 8.9–10.3)
Chloride: 107 mmol/L (ref 98–111)
GFR calc Af Amer: 46 mL/min — ABNORMAL LOW (ref >60)
GFR calc non Af Amer: 39 mL/min — ABNORMAL LOW (ref >60)
Glucose, Bld: 221 mg/dL — ABNORMAL HIGH (ref 70–99)
Potassium: 3.6 mmol/L (ref 3.5–5.1)
SODIUM: 145 mmol/L (ref 135–145)

## 2018-07-17 LAB — MAGNESIUM: MAGNESIUM: 2.1 mg/dL (ref 1.7–2.4)

## 2018-07-17 LAB — CULTURE, BLOOD (ROUTINE X 2)
CULTURE: NO GROWTH
Culture: NO GROWTH
SPECIAL REQUESTS: ADEQUATE
Special Requests: ADEQUATE

## 2018-07-17 LAB — PROTIME-INR
INR: 1.54
Prothrombin Time: 18.3 seconds — ABNORMAL HIGH (ref 11.4–15.2)

## 2018-07-17 LAB — PHOSPHORUS: Phosphorus: 4.7 mg/dL — ABNORMAL HIGH (ref 2.5–4.6)

## 2018-07-17 LAB — FIBRINOGEN: Fibrinogen: 363 mg/dL (ref 210–475)

## 2018-07-17 MED ORDER — INSULIN ASPART 100 UNIT/ML ~~LOC~~ SOLN
0.0000 [IU] | SUBCUTANEOUS | Status: DC
  Administered 2018-07-17 – 2018-07-18 (×6): 4 [IU] via SUBCUTANEOUS
  Administered 2018-07-18: 7 [IU] via SUBCUTANEOUS
  Administered 2018-07-18 (×2): 4 [IU] via SUBCUTANEOUS
  Administered 2018-07-18 – 2018-07-19 (×2): 7 [IU] via SUBCUTANEOUS
  Administered 2018-07-19 – 2018-07-20 (×11): 4 [IU] via SUBCUTANEOUS
  Administered 2018-07-21: 3 [IU] via SUBCUTANEOUS
  Administered 2018-07-21: 1 [IU] via SUBCUTANEOUS
  Administered 2018-07-21 (×2): 3 [IU] via SUBCUTANEOUS
  Administered 2018-07-21 (×2): 4 [IU] via SUBCUTANEOUS
  Administered 2018-07-22: 3 [IU] via SUBCUTANEOUS
  Administered 2018-07-22 (×3): 4 [IU] via SUBCUTANEOUS
  Administered 2018-07-22: 3 [IU] via SUBCUTANEOUS
  Administered 2018-07-23 (×3): 4 [IU] via SUBCUTANEOUS

## 2018-07-17 MED ORDER — VITAL HIGH PROTEIN PO LIQD
1000.0000 mL | ORAL | Status: DC
  Administered 2018-07-17: 1000 mL
  Filled 2018-07-17: qty 1000

## 2018-07-17 MED ORDER — POTASSIUM CHLORIDE 10 MEQ/50ML IV SOLN
10.0000 meq | INTRAVENOUS | Status: AC
  Administered 2018-07-17 (×2): 10 meq via INTRAVENOUS
  Filled 2018-07-17 (×2): qty 50

## 2018-07-17 MED ORDER — RIFAXIMIN 550 MG PO TABS
550.0000 mg | ORAL_TABLET | Freq: Three times a day (TID) | ORAL | Status: DC
  Administered 2018-07-17 – 2018-07-23 (×19): 550 mg via ORAL
  Filled 2018-07-17 (×19): qty 1

## 2018-07-17 MED ORDER — FUROSEMIDE 10 MG/ML IJ SOLN
40.0000 mg | Freq: Four times a day (QID) | INTRAMUSCULAR | Status: AC
  Administered 2018-07-17 (×2): 40 mg via INTRAVENOUS
  Filled 2018-07-17 (×2): qty 4

## 2018-07-17 MED ORDER — ZINC TRACE METAL 1 MG/ML IV SOLN
INTRAVENOUS | Status: AC
  Administered 2018-07-17: 18:00:00 via INTRAVENOUS
  Filled 2018-07-17: qty 471.6

## 2018-07-17 MED ORDER — VITAL AF 1.2 CAL PO LIQD
1500.0000 mL | ORAL | Status: DC
  Administered 2018-07-17 – 2018-07-18 (×2): 1500 mL
  Filled 2018-07-17: qty 1500

## 2018-07-17 NOTE — Progress Notes (Signed)
Nutrition Follow-up  DOCUMENTATION CODES:   Obesity unspecified  INTERVENTION:  Continue TPN per Pharmacy until pt demonstrating tolerance and titration of TF and meeting at least 50% of estimated nutritional needs via TF  Tube Feeding: Change to Vital AF 1.2 @ 20 Goal Rate: Vital AF 1.2 @ 50 ml/hr, Pro-Stat 30 mL daily Provides 105 g of protein, 1540 kcals and 972 mL of free water   NUTRITION DIAGNOSIS:   Inadequate oral intake related to acute illness as evidenced by NPO status.  Being addressed via nutrition support  GOAL:   Patient will meet greater than or equal to 90% of their needs  Progressing  MONITOR:   Vent status, TF tolerance, Labs, Weight trends  REASON FOR ASSESSMENT:   Consult, Ventilator Enteral/tube feeding initiation and management  ASSESSMENT:   62 yo female admitted 7/9 with weakness, anorexia and lethargy with NASH cirrhosis with hepatic encephalopathy, new onset Afib with RVR, new onset HFrEF 30-35%, anemia. On 7/15,  pt's condition worsened with increased confusion/lethargy, tardive dyskinesia symptoms, AKI, shock liver and respiratory failure requiring intubation.  Pt with hx of HTN, HLD, seizure disorder, left breast cancer s/p lumpectomy 2008, GERD, depression, DM     7/15 Intubated, Inadequate nutrition x 7 days 7/17 Trickle TF started but did not tolerate, vomiting 7/18 CT abdomen, colonic ileus with stool in ascending colon 7/21 Trickle TF re-started 7/22 Post-pyloric Cortrak tube placed 7/25- Extubated, TF at goal 7/28- Cardiac arrest, intubated, perforated descending colon and near perforation of cecum. Taken forIleocecectomy and partialcolectomy leftin discontinuity,open abdomen with wound vac 7/30 Return to OR, partial colectomy with closure of abdomen and creation of end ileostomy 8/1 TPN started  Pt remains on vent support, off levophed  TPN infusing at 30 ml/hr, goal rate is 70 ml/hr Tube Feeding started at 20 ml/hr less  than an hour ago via Cortrak tube  NG to LIS with 330 mL out Wound Vac: 425 mL UOP: 603 mL  Net positive 18 L since admission; current wt 210 pounds (95.6 kg). EDW 72 kg.   Labs: CBGs 150-206, sodium 145 (wdl), Creatinine 1.40, BUN 55 Meds: reviewed  Diet Order:   Diet Order           Diet NPO time specified  Diet effective now          EDUCATION NEEDS:   Education needs have been addressed  Skin:  Skin Assessment: Skin Integrity Issues: Skin Integrity Issues:: Wound VAC Wound Vac: abdomen Other: MASD: buttocks, groin, perineum  Last BM:  8/2 Ileostomy: small amount  Height:   Ht Readings from Last 1 Encounters:  07/04/18 5\' 2"  (1.575 m)    Weight:   Wt Readings from Last 1 Encounters:  07/17/18 210 lb 10.8 oz (95.6 kg)    Ideal Body Weight:  50 kg  BMI:  Body mass index is 38.53 kg/m.  Estimated Nutritional Needs:   Kcal:  1589 kcals  Protein:  100-125 g  Fluid:  >/= 1.5 L   Kerman Passey MS, RD, LDN, CNSC [phone] Pager  [phone] Weekend/On-Call Pager

## 2018-07-17 NOTE — Procedures (Signed)
ELECTROENCEPHALOGRAM REPORT   Patient: Brandi [name]       Room #: [facility] EEG No. ID: [id number] Age: 62 y.o.        Sex: female Referring Physician: Lake Bells Report Date:  07/17/2018        Interpreting Physician: Alexis Goodell  History: Brandi [name] is an 62 y.o. female with prolonged altered mental status requiring intubation  Medications:  Lasix, Insulin, Vimpat, Keppra, Lopressor, Zosyn, Xifaxan, Cardizem, Fentanyl  Conditions of Recording:  This is a 21 channel routine scalp EEG performed with bipolar and monopolar montages arranged in accordance to the international 10/20 system of electrode placement. One channel was dedicated to EKG recording.  The patient is in the intubated and sedated state.  Description:  The background activity is of low voltage, slow and poorly organized.  Superimposed on this background are K-complexes.  These K-complexes consist of symmetrical sleep spindles and vertex central sharp transients.  This activity is persistent throughout the recording.   Hyperventilation and intermittent photic stimulation were not performed.  IMPRESSION: This is a normal asleep electroencephalogram.  No epileptiform activity is noted.   Alexis Goodell, MD Neurology [phone] 07/17/2018, 8:09 PM

## 2018-07-17 NOTE — Progress Notes (Signed)
PHARMACY - ADULT TOTAL PARENTERAL NUTRITION CONSULT NOTE   Pharmacy Consult for TPN Indication: Post-operative ileus  Patient Measurements: Height: _0  (157.5 cm) Weight: 210 lb 10.8 oz (95.6 kg) IBW/kg (Calculated) : 50.1 TPN AdjBW (KG): 52.8 Body mass index is 38.53 kg/m. Usual Weight: ~72 kg  Assessment: 62 year old female admitted 06/23/18 with weakness, anorexia, and lethargy in setting of NASH cirrhosis and hepatic encephalopathy with new onset Afib with RVR and HFrEF (30-35%). On 7/15, patient's clinical status worsened and increased confusion and multiorgan failure including respiratory failure requiring intubation. Patient improved and was extubated 7/25. However, patient had subsequent cardiac arrest on 7/28 resulting in perforation of descending colon and near perforation of cecum requiring OR for lleocecectomy and partial colectomy with discontinuity and open abdomen. She returned to the OR on 7/30 with creation of end ileostomy and closure of abdomen. Patient has been NPO >7 days and with limited nutrition since admission (22 days) therefore is at high risk for refeeding. Patient has Cortrak and NGT in place with minimal output from NGT. Surgery holding on restarting TF until output from ostomy.   GI: Ostomy with 0 output. NGT output 330 mL/24hr. Wound output 425 mL/24hr.  Pre-albumin <5. Albumin 2.2. Wound Vac to be changed 8/2. LBM 8/1 (small/red liquid only).  Endo: Hx DM- no meds PTA. CBGs up 150-221 up with start of TPN on moderate SSI q4h. On stress steroids. Insulin requirements in the past 24 hours: 10 units  Lytes: Na down 145 and K improved at 3.6. Phos up this AM at 4.7. Mg wnl. CoCa 9.4.  Renal: SCR up 1.40 and BUN up 55. UOP 0.3 mL/kg/hr. IVF- LR at 75m/hr.  I/O +1.3L. Net +18.6L.  Pulm: Vent, FiO2 40% d/t inability to protect airway. Cards: Off Pressors. Afib with HR 80-90s on scheduled Metoprolol IV, off Diltiazem drip. Required significant transfusions 7/28 when  perforated colon.  Currently, Hgb 9.3 and Plts up to 44.  Hepatobil: AST/ALT had been trending down- slight bump back up today. Alk Phos rising. Tbili rising at 9.5.  Neuro: Hepatic encephalopathy- RASS 1, GCS 8. Sedated on Fentanyl at 1039m/hr. Sz history on Vimpat + Keppra.  ID: Zosyn day #5. WBC up at 21.7, Afebrile. PCT up 34.8 on 7/29. LA down from 8.7 to 4.9 on 7/29.  TPN Access: Double-lumen PICC placed 7/26 TPN start date: 8/1 Nutritional Goals (per RD recommendation on 7/31): KCal: 1589 kcal Protein: 100-125g Fluid: >/= 1.5L   Goal TPN rate is 70 ml/hr (provides 100% of needs)  Current Nutrition:  NPO, TPN  Plan:  Continue TPN at 3013mr until lytes and CBGs controlled.  Hold 20% lipid emulsion for first 7 days for ICU patients per ASPEN guidelines (Start date 8/8). This TPN provides 47 g of protein, 137 g of dextrose, and 0 g of lipids which provides 654 kCals per day, meeting 41% of patient needs Electrolytes in TPN: Continue lower Na, increase K, decrease Phos, maintain standard for others and monitor. Cl:Ac 1:1 for now.  Add MVI to TPN. Will not add trace elements due to elevated Tbili, but will add Zinc 5mg64my, Chromium 10 mcg/day, and Selenium 60 mcg/day. Add Pepcid to TPN.  Increase to resistant SSI q4h and adjust as needed Monitor TPN labs, CMET, Mg and Phos in AM.  F/U for output from ostomy and ability to initiate TF via Cortrak tube.    KCl 10mE86m x2 runs today (also increasing in TPN).  JessiSloan LeiterrmD, BCPS, BCCCP  Clinical Pharmacist Clinical phone 07/17/2018 until 3:30PM - #[phone] Please refer to AMION 07/17/2018,7:36 AM

## 2018-07-17 NOTE — Progress Notes (Signed)
EEG complete - results pending 

## 2018-07-17 NOTE — Progress Notes (Signed)
3 Days Post-Op   Subjective/Chief Complaint: Pt on vent minimally responsive    Objective: Vital signs in last 24 hours: Temp:  [98.4 F (36.9 C)-100.2 F (37.9 C)] 99.3 F (37.4 C) (08/02 0745) Pulse Rate:  [74-107] 86 (08/02 0759) Resp:  [9-26] 12 (08/02 0759) BP: (79-166)/(47-117) 108/97 (08/02 0759) SpO2:  [96 %-100 %] 98 % (08/02 0600) FiO2 (%):  [40 %] 40 % (08/02 0800) Weight:  [95.6 kg (210 lb 10.8 oz)] 95.6 kg (210 lb 10.8 oz) (08/02 0247) Last BM Date: 07/16/18  Intake/Output from previous day: 08/01 0701 - 08/02 0700 In: 2649.4 [I.V.:1255.7; IV Piggyback:1393.7] Out: 1358 [Urine:603; Emesis/NG output:330; Drains:425] Intake/Output this shift: No intake/output data recorded.  Incision/Wound:wound vac in place  Ostomy light but viable small amount of succus noted   Lab Results:  Recent Labs    07/16/18 0415 07/16/18 0910  07/17/18 0505 07/17/18 0736  WBC 14.7* 21.7*  --   --   --   HGB 7.5* 8.7*   < > 9.3* 8.9*  HCT 23.8* 28.2*   < > 29.8* 29.1*  PLT 32* 44*  --   --   --    < > = values in this interval not displayed.   BMET Recent Labs    07/16/18 0910 07/17/18 0505  NA 145 145  K 3.9 3.6  CL 105 107  CO2 25 26  GLUCOSE 104* 221*  BUN 49* 55*  CREATININE 1.41* 1.40*  CALCIUM 7.9* 8.0*   PT/INR Recent Labs    07/16/18 0415 07/17/18 0505  LABPROT 19.4* 18.3*  INR 1.65 1.54   ABG No results for input(s): PHART, HCO3 in the last 72 hours.  Invalid input(s): PCO2, PO2  Studies/Results: Dg Chest Port 1 View  Result Date: 07/17/2018 CLINICAL DATA:  Followup ventilator support. EXAM: PORTABLE CHEST 1 VIEW COMPARISON:  07/16/2018 FINDINGS: Endotracheal tube tip is 1 cm above the carina. Nasogastric tube and soft feeding tube enters the abdomen. Right arm PICC tip is at the SVC RA junction or proximal right atrium. Right internal jugular central line tip is in the SVC above the right atrium. Edema/infiltrates persist diffusely, worse on the left  than the right. Bilateral pleural effusions persist. No qualitatively new finding. IMPRESSION: Lines and tubes appear unchanged and satisfactory. Persistent edema/infiltrate pattern with bilateral effusions. Electronically Signed   By: Nelson Chimes M.D.   On: 07/17/2018 07:29   Dg Chest Port 1 View  Result Date: 07/16/2018 CLINICAL DATA:  Respiratory failure. EXAM: PORTABLE CHEST 1 VIEW COMPARISON:  07/15/2018. FINDINGS: Endotracheal tube tip 1.5 cm above the carina. Right jugular catheter tip in the inferior aspect of the superior vena cava. Right PICC tip at the superior cavoatrial junction. Nasogastric tube tip in the distal stomach. Feeding tube extending into the proximal duodenum with its tip not included. Stable enlarged cardiac silhouette. Increased left basilar airspace opacity and pleural fluid. Decreased right basilar airspace opacity and pleural fluid. Increased right perihilar airspace opacity. Positional scoliosis. Cholecystectomy clips. Left breast and axillary surgical clips. IMPRESSION: 1. Increased left basilar atelectasis or pneumonia and pleural fluid. 2. Decreased right basilar atelectasis and pleural fluid. 3. Increased right perihilar atelectasis or pneumonia. 4. Low position of the endotracheal tube with its tip 1.5 cm above the carina. This could be retracted 3.5 cm. Electronically Signed   By: Claudie Revering M.D.   On: 07/16/2018 08:59    Anti-infectives: Anti-infectives (From admission, onward)   Start     Dose/Rate Route  Frequency Ordered Stop   07/14/18 0600  ceFAZolin (ANCEF) IVPB 2g/100 mL premix     2 g 200 mL/hr over 30 Minutes Intravenous On call to O.R. 07/13/18 2234 07/14/18 1052   07/13/18 2100  fluconazole (DIFLUCAN) IVPB 200 mg  Status:  Discontinued     200 mg 100 mL/hr over 60 Minutes Intravenous Every 24 hours 07/12/18 1958 07/15/18 1224   07/13/18 1800  vancomycin (VANCOCIN) IVPB 1000 mg/200 mL premix  Status:  Discontinued     1,000 mg 200 mL/hr over 60  Minutes Intravenous Every 24 hours 07/12/18 1549 07/15/18 1224   07/13/18 0100  piperacillin-tazobactam (ZOSYN) IVPB 3.375 g     3.375 g 12.5 mL/hr over 240 Minutes Intravenous Every 8 hours 07/12/18 1549     07/12/18 2100  fluconazole (DIFLUCAN) IVPB 400 mg     400 mg 100 mL/hr over 120 Minutes Intravenous  Once 07/12/18 1958 07/12/18 2306   07/12/18 1600  vancomycin (VANCOCIN) 1,500 mg in sodium chloride 0.9 % 500 mL IVPB     1,500 mg 250 mL/hr over 120 Minutes Intravenous STAT 07/12/18 1549 07/12/18 1829   07/12/18 1600  piperacillin-tazobactam (ZOSYN) IVPB 3.375 g     3.375 g 100 mL/hr over 30 Minutes Intravenous STAT 07/12/18 1549 07/12/18 1701   07/12/18 0800  ampicillin-sulbactam (UNASYN) 1.5 g in sodium chloride 0.9 % 100 mL IVPB  Status:  Discontinued     1.5 g 200 mL/hr over 30 Minutes Intravenous Every 6 hours 07/12/18 0715 07/12/18 1538   07/04/18 1000  rifaximin (XIFAXAN) tablet 550 mg  Status:  Discontinued     550 mg Per Tube 3 times daily 07/04/18 0925 07/12/18 1647   06/30/18 2200  vancomycin (VANCOCIN) IVPB 1000 mg/200 mL premix  Status:  Discontinued     1,000 mg 200 mL/hr over 60 Minutes Intravenous Every 24 hours 06/30/18 2008 07/04/18 0925   06/30/18 2200  piperacillin-tazobactam (ZOSYN) IVPB 3.375 g  Status:  Discontinued     3.375 g 12.5 mL/hr over 240 Minutes Intravenous Every 8 hours 06/30/18 2011 07/06/18 1149   06/30/18 0000  vancomycin variable dose per unstable renal function (pharmacist dosing)  Status:  Discontinued      Does not apply See admin instructions 06/29/18 1229 07/03/18 0747   06/29/18 1300  piperacillin-tazobactam (ZOSYN) IVPB 2.25 g  Status:  Discontinued     2.25 g 100 mL/hr over 30 Minutes Intravenous Every 8 hours 06/29/18 1229 06/30/18 2011   06/29/18 1300  vancomycin (VANCOCIN) 1,250 mg in sodium chloride 0.9 % 250 mL IVPB     1,250 mg 166.7 mL/hr over 90 Minutes Intravenous  Once 06/29/18 1229 06/29/18 2234       Assessment/Plan: s/p Procedure(s): EXPLORATORY LAPAROTOMY (N/A) TRANSVERSE COLON RESECTION (N/A) CREATION OF ILEOSTOMY (N/A) APPLICATION OF WOUND VAC (change) (N/A) [place] cirrhosis with hepatic encephalopathy New onset atrial fibrillation with rapid ventricular rate New onset cardiomyopathy with EF 30 to 35% NewRight frontal operculum infarct History of seizure disorder Hypertension Type 2 diabetes well controlled History of schizophrenia  Septic shock due to perforated descending colon  - WBC 14.7, afebrile - off pressors S/p ex-lap with ileocecectomy and partial descending colectomy left in discontinuity with abdominal wound VAC placement 07/12/18 Dr. Barry Dienes - POD#5 S/p ex-lap partial colectomy with closure of abdomen and creation of end ileostomy 07/14/18 Dr. Brantley Stage - POD#2 - no output from ostomy yet - would not recommend starting TF until having at least some gas from ileostomy - recommend  TPN for nutrition until return of bowel function  - 200 cc out of NGT in 24h, continue on LIWS - VAC to be changed today   FEN: IVF, start TPN; NGT to LIWS of pressors trickle TF  VTE: SCDs ID: current abx - Zosyn 7/29>>    LOS: 24 days    Marcello Moores A Nezar Buckles 07/17/2018

## 2018-07-17 NOTE — Consult Note (Addendum)
[facility] Nurse wound consult note Reason for Consult: Midline abdominal wound dressing vac change.   Full thickness post-op wound; 24X4X5cm Red woundbed with yellow adipose interspersed throughout.   Mod amt yellow drainage in the cannister, no odor Pt is on continuous IV for pain and tolerated with minimal amt discomfort.  Applied 1 piece black foam to 167mm cont suction.   There are red partial thickness wounds from previous medical adhesive related skin injuries surrounding the wounds, did not cover with drape and left open to air.  [place] Nurse ostomy consult note Stoma type/location: RLQ ileostomy, flush Stomal assessment/size: 1 1/4 inches, flush with skin level, dark red-purple. Small amt liquid brown stool in pouch. Ostomy pouching: Applied one piece convex pouch with barrier ring to attempt to maintain a seal. Education provided: Pt is sedated and intubated and there are no family members at the bedside. Enrolled patient in [facility] program: No WOC team will plan to change Vac and ostomy pouch Q M/W/F and begin teaching sessions when stable and out of ICU. Julien Girt MSN, RN, [place], [place], [place]

## 2018-07-17 NOTE — Progress Notes (Signed)
PULMONARY / CRITICAL CARE MEDICINE   Name: ELBA SCHABER MRN: [id number] DOB: Jun 07, 1956    ADMISSION DATE:  06/23/2018 CONSULTATION DATE: 06/29/2018  REFERRING MD: Dr. Bonner Puna  CHIEF COMPLAINT: Altered mental status  HISTORY OF PRESENT ILLNESS:  62 y/o F who presented to [facility] on 7/9 with abdominal pain, nausea, vomiting, decreased oral intake and weakness.    She was reportedly evaluated by her PCP prior to admit with elevated LFT's (AST 302, ALT 611, AP 380).  Follow up CT of the abdomen demonstrated prominent caudate lobe and surface irregularity of the liver.   In the ER, US of the abdomen confirmed the CT findings of the liver.  She was noted to be lethargic and have asterixis on exam.  Ammonia 22 (7/10) > 38 (7/12).  The patient had AFwRVR and was treated with cardizem.  She was admitted for further evaluation.  The patient was seen by Cardiology and transitioned to metoprolol for rate control due to decreased LVEF (30-35%). Labs showed a microcytic anemia with ferritin of 9.  GI was consulted for evaluation with recommendations for lactulose and eventual endoscopic evaluation.  She had ongoing encephalopathy and a CT of the head was obtained which showed a right frontal operculum CVA.  Neurology evaluated the patient and felt the stroke was likely related to AF but not the cause of her mental status.  She was recommended anticoagulation and passed a swallow evaluation converting from heparin to eliquis.  On 7/15, her confusion, tardive dyskinesia symptoms and lethargy worsened as well as her LFT's (AST 3942, ALT 1976, ALK Phos 404), renal function and WBC.  PCCM consulted for evaluation.  Anticoagulation stopped due to shock liver.  PSY meds were held due to severe tardive dyskinesia and liver dysfunction.  She had prolonged intubation due to poor mental status.  The patient was extubated on 7/25 without difficulty.  She was transitioned to [facility] on 7/26.  She developed agitated delirium overnight  7/26 and precedex was initiated by [facility]Link.  PCCM called back 7/28 to assess patient as she had coffee ground emesis overnight, concern for aspiration, hypotension that responded to 532m saline bolus x2.  As day progressed on 7/28, she had progressive abdominal pain.  CT of the abd/pelvis demonstrated bowel perforation.  She was intubated, had brief arrest post intubation (9 min) and was taken to the OR.  Returned to ICU on epi, neo, vaso, levo, bicarb.  Pressor need improved overnight into 7/29.    SUBJECTIVE:  She is off pressors, off Cardizem.  Less agitated.  Appears volume overloaded.  VITAL SIGNS: BP (!) 108/97   Pulse 86   Temp 99.3 F (37.4 C) (Axillary)   Resp 12   Ht '5\' 2"'  (1.575 m)   Wt 210 lb 10.8 oz (95.6 kg)   SpO2 98%   BMI 38.53 kg/m   HEMODYNAMICS: CVP:  [5 mmHg-13 mmHg] 13 mmHg  VENTILATOR SETTINGS: Vent Mode: CPAP;PSV FiO2 (%):  [40 %] 40 % Set Rate:  [18 bmp] 18 bmp Vt Set:  [460 mL] 460 mL PEEP:  [5 cmH20] 5 cmH20 Pressure Support:  [12 cmH20] 12 cmH20 Plateau Pressure:  [19 cmH20-22 cmH20] 21 cmH20  INTAKE / OUTPUT:  Intake/Output Summary (Last 24 hours) at 07/17/2018 1055 Last data filed at 07/17/2018 1000 Gross per 24 hour  Intake 1651.3 ml  Output 1353 ml  Net 298.3 ml    PHYSICAL EXAMINATION: General: Less agitated on vent HEENT: Endotracheal tube to ventilator Neuro: Poorly responsive but  less jerking movements noted CV: Sounds are regular regular PULM: even/non-labored, lungs bilaterally decreased in bases GI: Wound VAC in place no bowel sounds appreciated Extremities: warm/dry, plus edema  Skin: no rashes or lesions     LABS:  BMET Recent Labs  Lab 07/16/18 0415 07/16/18 0910 07/17/18 0505  NA 146* 145 145  K 3.2* 3.9 3.6  CL 109 105 107  CO2 '28 25 26  ' BUN 46* 49* 55*  CREATININE 1.33* 1.41* 1.40*  GLUCOSE 105* 104* 221*    Electrolytes Recent Labs  Lab 07/16/18 0415 07/16/18 0910 07/17/18 0505  CALCIUM 7.6* 7.9* 8.0*   MG 1.9 1.8 2.1  PHOS 4.3 4.6 4.7*    CBC Recent Labs  Lab 07/15/18 0313  07/16/18 0415 07/16/18 0910  07/16/18 2335 07/17/18 0505 07/17/18 0736  WBC 17.0*  --  14.7* 21.7*  --   --   --   --   HGB 9.4*   < > 7.5* 8.7*   < > 7.9* 9.3* 8.9*  HCT 29.9*   < > 23.8* 28.2*   < > 25.5* 29.8* 29.1*  PLT 32*  --  32* 44*  --   --   --   --    < > = values in this interval not displayed.    Coag's Recent Labs  Lab 07/15/18 0313 07/16/18 0415 07/17/18 0505  INR 1.58 1.65 1.54    Sepsis Markers Recent Labs  Lab 07/12/18 2012 07/13/18 0125 07/13/18 0700 07/13/18 0949 07/13/18 1542 07/14/18 0658  LATICACIDVEN 8.4* 8.7*  --  6.0* 4.9*  --   PROCALCITON 21.93  --  55.17  --   --  34.83    ABG Recent Labs  Lab 07/12/18 2003 07/13/18 1010 07/14/18 0335  PHART 7.255* 7.471* 7.526*  PCO2ART 38.8 30.7* 34.0  PO2ART 66.0* 101 78.6*    Liver Enzymes Recent Labs  Lab 07/14/18 0658 07/16/18 0415 07/16/18 0910  AST 67* 66* 89*  ALT 83* 45* 51*  ALKPHOS 107 290* 352*  BILITOT 6.3* 7.6* 9.5*  ALBUMIN 1.9* 1.9* 2.2*    Cardiac Enzymes Recent Labs  Lab 07/12/18 1755 07/12/18 2012 07/12/18 2323  TROPONINI 0.07* 0.07* 0.07*    Glucose Recent Labs  Lab 07/16/18 1149 07/16/18 1546 07/16/18 2009 07/16/18 2337 07/17/18 0414 07/17/18 0757  GLUCAP 95 114* 158* 150* 206* 197*    Imaging  STUDIES:  [place] ABD 7/6 >> possible cirrhosis, gallbladder surgically absent CT Head 7/9 >> atrophy with small vessel chronic ischemic changes of deep cerebral white matter CT Renal Study 7/9 >> no acute findings, bilateral non-obstructing renal stones ECHO 7/9 >> LV mildly dilated, wall thickness was increased in a pattern of moderate LVH, LVEF 30-35%, severe hypokinesis of the anteroseptal, anterior, inferoseptal & apical myocardium, mild to moderate MV regurgitation, LA severely dilated, RV moderately dilated, RA mod-severely dilated, PA pressure 47mHg, trivial pericardial  effusion. CTA Head, Neck 7/12 >> minimal calcified plaque at the carotid bifurcation regions but no stenosis or irregularity, no large vessel occlusions, unable to specifically identify the missing branch vessel responsible for the right posterior frontal infarction but this is presumably an occluded M3 or distal branch CT Head w/o 7/12 >> the small right middle cerebral artery distribution, posterolateral frontal lobe infarct has evolved since the prior CT, it is now seen as a well defined area of hypoattenuation, no new areas of infarct, no ICH CT head 7/15 >> Redemonstration of right posterior frontal lobe infarct. No acute intracranial hemorrhage  or new infarct identified.  Atrophy with chronic small vessel ischemia. CT abd/pelvis 7/18 > Diffuse dilation of the colon with large amount of ascending colonic stool.  CT ABD/Pelvis 7/28 >> free air in the abd consistent with bowel rupture, unable to identify source, bilateral pleural effusions (stable), non-obstructive kidney stones  CULTURES: BCx2 7/10 >> negative  Cdiff 7/20 >> negative  Sputum 7/20 >> negative Sputum 7/29>>Kleb Pne SS zoysn  ANTIBIOTICS: Vanco 7/15 >> 7/21 Zoysn 7/15 >> 7/22 Unasyn 7/28 >> 7/28 Diflucan 7/28 >> 7/31 Zosyn 7/28 >>  Vanco 7/28 >>7/31  SIGNIFICANT EVENTS: 7/09  Admit  7/15  PCCM consulted with AMS, acute liver failure 7/19  Had been receiving fluid challenges for hypotension. 7/20   Early in the a.m. hours drop blood pressure.  Placed on pressors.  Non-anion gap acidosis noted.  Cortisol 29.2 CBC remains elevated blood cell count remains elevated.  7/21  no significant fever.  White cell count cut in half.  Chest x-ray looks improved.  We resumed lactulose and rifaximin yesterday, also resumed spironolactone.  Still obtunded  7/22  Free water increased 7/23  800 ml in flexiseal overnight.  Off home PSY meds. Mg/K low.  Na 153 on free water. Continues to have tardive dyskinesia.  7/24 Tmax 100.2.  I/O-  positive 8.6L for admit / 2125 of stool out 7/23. 7/26  extubated on 7/25. Remains encephalopathic. NA still elevated  7/28  PCCM called back for hypotension on precedex, coffee ground emesis. Concern for aspiration, hypotension that responded to 569m saline bolus x2.  TF held overnight with nausea/abd pain.  NGT placed due to vomiting. ABD pain increased > CT abd with bowel perforation > to OR for resection/wash out   LINES/TUBES: ETT 7/15 >> 7/25 ETT 7/28 >>  L Rad Aline 7/28 >> out R IJ dual lumen 7/28 >>  DISCUSSION: 62y/o F admitted with weakness, decreased intake and lethargy.  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  She developed AMS > small infarct thought not related to AMS.  Recommended for anticoagulation by Cardiology/Neurology, was transitioned from heparin to eliquis.  The patient deteriorated on 7/15 with a rise in LFT's, AKI, confusion/AMS, worsening of tardive dyskinesia type symptoms.  Transferred to ICU.    ICU course significant for slow improvement of mental status (she actually looks the best I have seen her on 7/28).  She remains on room air with no distress.  She has had periods of agitation which I suspect are related to being off her maintenance PSY regimen (due to liver dysfunction). She has been seen by PSY (see recommendations).  Her electrolytes have difficult to manage in the setting of high stool output & poor nutritional status. She has been on free water and dextrose via IV. She pulled her flexiseal out multiple times overnight 7/28.  ABD pain worsened 7/28 during the day > CT ABD with perforation, to OR with Dr. BBarry Dienes  07/15/2018 to wean her altered mental status is a block to any attempt at extubation. 07/16/2018 TPN to start Patient to less agitated on 07/17/2008   ASSESSMENT / PLAN:     Active issues   Acute Hypoxic Respiratory Failure - in setting of bowel perforation, shock Bilateral Pleural Effusions - suspect in setting of liver  disease, HF, AKI Klebsiella pneumoniae in sputum P: Wean per protocol Mental status is a block to extubation May need tracheostomy in the future  Acute metabolic & hepatic encephalopathy / acute delirium - improved CVA -  Right Frontal Operculum infarct - felt related to AF/embolic but not all cause of mental status Right-sided weakness Seizure disorder Schizophrenia Tardive Dyskinesia - improved 7/28  P:  07/17/2018 less jerking movements noted  HFrEF - LVEF 30% AF with RVR Hypotension - 7/28, suspect related to intravascular volume depletion, sedation Hx HTN   -She has been seen by cardiology Anticoagulation P: Off anticoagulation Rate controlled currently off Cardizem  AGMA - in setting of bowel perforation, shock  Fluid and Electrolyte imbalance: Hypernatremia / Hyperchloremia, Hypokalemia  Recent Labs  Lab 07/16/18 0415 07/16/18 0910 07/17/18 0505  K 3.2* 3.9 3.6    P: Replete electrolytes as needed Currently on TPN  [place] Cirrhosis with Hepatic Encephalopathy  - unclear in what precipitated change in events LFTs/ decompensation 7/15 - 7/15 US liver w/doppler with no evidence of portal venous thrombosis  -LFTs improved P: Intermittently follow-up LFTs  Perforated Viscus  At Risk Abscess Development  - mid descending colon perforation  P:  Per general surgery    Leukocytosis, questionable sepsis however etiology unclear.   Klebsiella pneumoniae in sputum - Antibiotics + antifungal restarted 7/28 with abd perforation  P: Continue antimicrobial therapy  Coagulopathy Thrombocytopenia plts 32->44 Anemia Recent Labs    07/17/18 0505 07/17/18 0736  HGB 9.3* 8.9*  plts 32 Lab Results  Component Value Date   INR 1.54 07/17/2018   INR 1.65 07/16/2018   INR 1.58 07/15/2018     P: Treatment platelets currently is gone from 32-44 Monitor for hemorrhage  Coffee Ground Emesis - new 7/28 Vomiting P: Continue Pepcid No further reports of  coffee-ground emesis  DM 2 CBG (last 3)  Recent Labs    07/16/18 2337 07/17/18 0414 07/17/18 0757  GLUCAP 150* 206* 197*    P:  Continue sliding-scale insulin spike with initiation of TPN  Hx Breast Cancer P: Follow-up as outpatient  DVT prophylaxis: SCD's SUP: H2 BID Diet: NPO, TPN to started 07/16/2018, tube feeds started 07/17/2017 Activity: BR Disposition : ICU, critically ill   FAMILY  - Updates: 07/17/2018 no family at bedside at time of dictation    CC time 30 minutes   Richardson Landry Minor ACNP Maryanna Shape PCCM Pager [phone] till 1 pm If no answer page [phone] 07/17/2018, 10:55 AM   Attending:    Subjective: Less myoclonic jerking No acute events  Objective: Vitals:   07/17/18 1000 07/17/18 1100 07/17/18 1129 07/17/18 1140  BP: 129/69 114/78 114/78   Pulse: 79 83 90   Resp: (!) '7 10 15   ' Temp:    99 F (37.2 C)  TempSrc:    Axillary  SpO2: 96% 98% 92%   Weight:      Height:       Vent Mode: CPAP;PSV FiO2 (%):  [40 %] 40 % Set Rate:  [18 bmp] 18 bmp Vt Set:  [460 mL] 460 mL PEEP:  [5 cmH20] 5 cmH20 Pressure Support:  [12 cmH20] 12 cmH20 Plateau Pressure:  [19 cmH20-22 cmH20] 21 cmH20  Intake/Output Summary (Last 24 hours) at 07/17/2018 1409 Last data filed at 07/17/2018 1156 Gross per 24 hour  Intake 1244.9 ml  Output 1163 ml  Net 81.9 ml    General:  In bed on vent HENT: NCAT ETT in place PULM: CTA B, vent supported breathing CV: RRR, no mgr GI: BS present, wound dressing in place MSK: normal bulk and tone Neuro: sedated on vent     CBC    Component Value Date/Time   WBC  25.3 (H) 07/17/2018 1127   RBC 3.90 07/17/2018 1127   HGB 9.4 (L) 07/17/2018 1127   HGB 9.9 (L) 06/22/2018 1350   HGB 13.2 10/24/2014 1336   HCT 34.2 (L) 07/17/2018 1127   HCT 32.8 (L) 06/22/2018 1350   HCT 40.5 10/24/2014 1336   PLT 73 (L) 07/17/2018 1127   PLT 555 (H) 06/22/2018 1350   MCV 87.7 07/17/2018 1127   MCV 64 (L) 06/22/2018 1350   MCV 80.7  10/24/2014 1336   MCH 24.1 (L) 07/17/2018 1127   MCHC 27.5 (L) 07/17/2018 1127   RDW 34.5 (H) 07/17/2018 1127   RDW 22.9 (H) 06/22/2018 1350   RDW 15.3 (H) 10/24/2014 1336   LYMPHSABS 0.8 07/17/2018 1127   LYMPHSABS 2.1 06/22/2018 1350   LYMPHSABS 2.6 10/24/2014 1336   MONOABS 1.3 (H) 07/17/2018 1127   MONOABS 0.8 10/24/2014 1336   EOSABS 0.0 07/17/2018 1127   EOSABS 0.2 06/22/2018 1350   EOSABS 0.2 06/24/2013 0610   BASOSABS 0.0 07/17/2018 1127   BASOSABS 0.1 06/22/2018 1350   BASOSABS 0.0 10/24/2014 1336    BMET    Component Value Date/Time   NA 145 07/17/2018 0505   NA 138 06/22/2018 1350   NA 144 10/24/2014 1336   K 3.6 07/17/2018 0505   K 3.5 10/24/2014 1336   CL 107 07/17/2018 0505   CL 108 (H) 06/24/2013 0610   CL 103 10/22/2012 1343   CO2 26 07/17/2018 0505   CO2 31 (H) 10/24/2014 1336   GLUCOSE 221 (H) 07/17/2018 0505   GLUCOSE 116 10/24/2014 1336   GLUCOSE 153 (H) 10/22/2012 1343   BUN 55 (H) 07/17/2018 0505   BUN 19 06/22/2018 1350   BUN 14.9 10/24/2014 1336   CREATININE 1.40 (H) 07/17/2018 0505   CREATININE 0.77 10/22/2017 1007   CREATININE 0.8 10/24/2014 1336   CALCIUM 8.0 (L) 07/17/2018 0505   CALCIUM 10.7 (H) 10/24/2014 1336   GFRNONAA 39 (L) 07/17/2018 0505   GFRNONAA >60 06/24/2013 0610   GFRAA 46 (L) 07/17/2018 0505   GFRAA >60 06/24/2013 0610    CXR images personally reviewed, bibasilar airspace disease, ett in place  Impression/Plan: Large bowel perforation, s/p ex-lap: post op wound care and nutrition per surgery  CKD: stable, monitor UOP, diurese today Hepatic encephalopathy: resume rifaximin, if gut OK then use lactulose on 8/3,  Acute respiratory failure with hyopxemia: continue full vent support, diurese as able, suspect she will require intubation until mental status improves, may need tracheostomy Stroke: resume anticoagulation when able Seizure: vimpat, keppra, check eeg to rule out status epilepticus as cause of  encephalopathy   My cc time 31 minutes  Roselie Awkward, MD [place] P[facility] Pager: [phone] Cell: [phone] After 3pm or if no response, call [phone]

## 2018-07-18 ENCOUNTER — Inpatient Hospital Stay (HOSPITAL_COMMUNITY): Payer: Medicare Other

## 2018-07-18 LAB — GLUCOSE, CAPILLARY
GLUCOSE-CAPILLARY: 172 mg/dL — AB (ref 70–99)
GLUCOSE-CAPILLARY: 180 mg/dL — AB (ref 70–99)
GLUCOSE-CAPILLARY: 205 mg/dL — AB (ref 70–99)
Glucose-Capillary: 168 mg/dL — ABNORMAL HIGH (ref 70–99)
Glucose-Capillary: 198 mg/dL — ABNORMAL HIGH (ref 70–99)
Glucose-Capillary: 226 mg/dL — ABNORMAL HIGH (ref 70–99)

## 2018-07-18 LAB — URINALYSIS, ROUTINE W REFLEX MICROSCOPIC
Bilirubin Urine: NEGATIVE
GLUCOSE, UA: NEGATIVE mg/dL
Ketones, ur: NEGATIVE mg/dL
LEUKOCYTES UA: NEGATIVE
Nitrite: NEGATIVE
Protein, ur: NEGATIVE mg/dL
SPECIFIC GRAVITY, URINE: 1.013 (ref 1.005–1.030)
pH: 5 (ref 5.0–8.0)

## 2018-07-18 LAB — COMPREHENSIVE METABOLIC PANEL
ALK PHOS: 710 U/L — AB (ref 38–126)
ALT: 50 U/L — ABNORMAL HIGH (ref 0–44)
ANION GAP: 7 (ref 5–15)
AST: 72 U/L — ABNORMAL HIGH (ref 15–41)
Albumin: 1.6 g/dL — ABNORMAL LOW (ref 3.5–5.0)
BUN: 53 mg/dL — ABNORMAL HIGH (ref 8–23)
CO2: 29 mmol/L (ref 22–32)
Calcium: 7.7 mg/dL — ABNORMAL LOW (ref 8.9–10.3)
Chloride: 111 mmol/L (ref 98–111)
Creatinine, Ser: 0.99 mg/dL (ref 0.44–1.00)
GFR calc non Af Amer: 60 mL/min — ABNORMAL LOW (ref >60)
Glucose, Bld: 202 mg/dL — ABNORMAL HIGH (ref 70–99)
POTASSIUM: 3.1 mmol/L — AB (ref 3.5–5.1)
SODIUM: 147 mmol/L — AB (ref 135–145)
Total Bilirubin: 10.1 mg/dL — ABNORMAL HIGH (ref 0.3–1.2)
Total Protein: 3.7 g/dL — ABNORMAL LOW (ref 6.5–8.1)

## 2018-07-18 LAB — CBC
HEMATOCRIT: 31.2 % — AB (ref 36.0–46.0)
Hemoglobin: 9.5 g/dL — ABNORMAL LOW (ref 12.0–15.0)
MCH: 24.3 pg — ABNORMAL LOW (ref 26.0–34.0)
MCHC: 30.4 g/dL (ref 30.0–36.0)
MCV: 79.8 fL (ref 78.0–100.0)
PLATELETS: 81 10*3/uL — AB (ref 150–400)
RBC: 3.91 MIL/uL (ref 3.87–5.11)
RDW: 28.5 % — AB (ref 11.5–15.5)
WBC: 24.7 10*3/uL — ABNORMAL HIGH (ref 4.0–10.5)

## 2018-07-18 LAB — FIBRINOGEN: Fibrinogen: 275 mg/dL (ref 210–475)

## 2018-07-18 LAB — PROTIME-INR
INR: 1.44
PROTHROMBIN TIME: 17.4 s — AB (ref 11.4–15.2)

## 2018-07-18 LAB — PHOSPHORUS: PHOSPHORUS: 3.5 mg/dL (ref 2.5–4.6)

## 2018-07-18 LAB — MAGNESIUM: Magnesium: 1.5 mg/dL — ABNORMAL LOW (ref 1.7–2.4)

## 2018-07-18 MED ORDER — POTASSIUM CHLORIDE 10 MEQ/50ML IV SOLN
10.0000 meq | INTRAVENOUS | Status: AC
  Administered 2018-07-18 (×6): 10 meq via INTRAVENOUS
  Filled 2018-07-18: qty 50

## 2018-07-18 MED ORDER — FUROSEMIDE 10 MG/ML IJ SOLN
40.0000 mg | Freq: Four times a day (QID) | INTRAMUSCULAR | Status: AC
  Administered 2018-07-18 (×2): 40 mg via INTRAVENOUS
  Filled 2018-07-18 (×2): qty 4

## 2018-07-18 MED ORDER — MAGNESIUM SULFATE 2 GM/50ML IV SOLN
2.0000 g | Freq: Once | INTRAVENOUS | Status: AC
  Administered 2018-07-18: 2 g via INTRAVENOUS
  Filled 2018-07-18: qty 50

## 2018-07-18 MED ORDER — ZINC TRACE METAL 1 MG/ML IV SOLN
INTRAVENOUS | Status: AC
  Administered 2018-07-18: 17:00:00 via INTRAVENOUS
  Filled 2018-07-18: qty 707.4

## 2018-07-18 NOTE — Progress Notes (Signed)
[facility] CONSULT NOTE   Pharmacy Consult for TPN Indication: Post-operative ileus  Patient Measurements: Height: 5' 2" (157.5 cm) Weight: 203 lb 4.2 oz (92.2 kg) IBW/kg (Calculated) : 50.1 TPN AdjBW (KG): 52.8 Body mass index is 37.18 kg/m. Usual Weight: ~72 kg  Assessment: 62 year old female admitted 06/23/18 with weakness, anorexia, and lethargy in setting of NASH cirrhosis and hepatic encephalopathy with new onset Afib with RVR and HFrEF (30-35%). On 7/15, patient's clinical status worsened and increased confusion and multiorgan failure including respiratory failure requiring intubation. Patient improved and was extubated 7/25. However, patient had subsequent cardiac arrest on 7/28 resulting in perforation of descending colon and near perforation of cecum requiring OR for lleocecectomy and partial colectomy with discontinuity and open abdomen. She returned to the OR on 7/30 with creation of end ileostomy and closure of abdomen. Patient has been NPO >7 days and with limited nutrition since admission (22 days) therefore is at high risk for refeeding. Patient has Cortrak and NGT in place with minimal output from NGT. Surgery holding on restarting TF until output from ostomy.   GI: Ostomy with 100 mL output. NGT output 200 mL/24hr. Wound output 890 mL/24hr.  Pre-albumin <5. Albumin 2.2. LBM 8/1 (small/red liquid only).  Endo: Hx DM- no meds PTA. CBGs 160-202 on resistant SSI q4h. Off steroids since 8/1, TPN + TF Insulin requirements in the past 24 hours: 24 units  Lytes: Na up at 147, K down to 3.1 after Lasix doses 8/2, Phos improved to 3.5. Mg down to 1.5 after Lasix 8/2. CoCa 9.4. *Repeating Lasix today Renal: SCR improved to 0.99 and BUN down 53. UOP 1.4 mL/kg/hr. IVF- LR at 82m/hr.  I/O -1.9L s/p Lasix 40 IV q6 x2 doses. Net + 16L. Pulm: Vent, FiO2 40% d/t inability to protect airway. Cards: Off Pressors. CVP 8. Afib with HR 80-90s on scheduled Metoprolol  IV, off Diltiazem drip. Required significant transfusions 7/28 when perforated colon.  Currently, Hgb 9.5 and Plts up to 81.  Hepatobil: AST/ALT trending back down. Alk Phos rising 710. Tbili rising at 10.1. Rifaximin restarted.  Neuro: Hepatic encephalopathy- RASS 1, GCS 8. Sedated on Fentanyl at 1015m/hr. Sz history on Vimpat + Keppra.  ID: Zosyn day #5. WBC up at 24.7, Afebrile. PCT up 34.8 on 7/29. LA down from 8.7 to 4.9 on 7/29.  TPN Access: Double-lumen PICC placed 7/26 TPN start date: 8/1 Nutritional Goals (per RD recommendation on 8/2): KCal: 1589 kcal Protein: 100-125g Fluid: >/= 1.5L   Goal TPN rate (without TF) is 70 ml/hr (provides 100% of needs)  Current Nutrition:  TPN Vital AF 1.2 at 20 mL/hr (provides  576 kcal, 36g protein)  Plan:  Increase TPN to 4548mr.  Hold 20% lipid emulsion for first 7 days for ICU patients per ASPEN guidelines (Start date 8/8). This TPN provides 71 g of protein, 205 g of dextrose, and 0 g of lipids which provides 981 kCals per day -With TPN + current TF, providing 107 g of protein and 1557 kcal meeting 100% protein needs and 98% kcal needs Electrolytes in TPN: Decrease Na, increase K, cont current Phos, and increase Mg. Cl:Ac 1:1 for now.  Add MVI to TPN. Will not add trace elements due to elevated Tbili, but will add Zinc 5mg34my, Chromium 10 mcg/day, and Selenium 60 mcg/day. Add Pepcid to TPN.  Add 15 units of regular insulin to TPN.  Continue resistant SSI q4h and adjust as needed. Stop LR with TPN +  TF.  Monitor TPN labs, CMET, Mg and Phos in AM.  F/U for output from ostomy and ability to advance TF via Cortrak tube.    Magnesium 2g IV x1 today. KCl 50mq IV x6 runs today.     JSloan Leiter PharmD, BCPS, BCCCP Clinical Pharmacist Clinical phone 07/18/2018 until 3:30PM [phone] refer to AMION 07/18/2018,8:38 AM

## 2018-07-18 NOTE — Progress Notes (Signed)
PULMONARY / CRITICAL CARE MEDICINE   Name: Brandi [name] MRN: [id number] DOB: April 03, 1956    ADMISSION DATE:  06/23/2018 CONSULTATION DATE: 06/29/2018  REFERRING MD: Dr. Bonner Puna  CHIEF COMPLAINT: Altered mental status  HISTORY OF PRESENT ILLNESS:  62 y/o F who presented to [facility] on 7/9 with abdominal pain, nausea, vomiting, decreased oral intake and weakness.    She was reportedly evaluated by her PCP prior to admit with elevated LFT's (AST 302, ALT 611, AP 380).  Follow up CT of the abdomen demonstrated prominent caudate lobe and surface irregularity of the liver.   In the ER, US of the abdomen confirmed the CT findings of the liver.  She was noted to be lethargic and have asterixis on exam.  Ammonia 22 (7/10) > 38 (7/12).  The patient had AFwRVR and was treated with cardizem.  She was admitted for further evaluation.  The patient was seen by Cardiology and transitioned to metoprolol for rate control due to decreased LVEF (30-35%). Labs showed a microcytic anemia with ferritin of 9.  GI was consulted for evaluation with recommendations for lactulose and eventual endoscopic evaluation.  She had ongoing encephalopathy and a CT of the head was obtained which showed a right frontal operculum CVA.  Neurology evaluated the patient and felt the stroke was likely related to AF but not the cause of her mental status.  She was recommended anticoagulation and passed a swallow evaluation converting from heparin to eliquis.  On 7/15, her confusion, tardive dyskinesia symptoms and lethargy worsened as well as her LFT's (AST 3942, ALT 1976, ALK Phos 404), renal function and WBC.  PCCM consulted for evaluation.  Anticoagulation stopped due to shock liver.  PSY meds were held due to severe tardive dyskinesia and liver dysfunction.  She had prolonged intubation due to poor mental status.  The patient was extubated on 7/25 without difficulty.  She was transitioned to [facility] on 7/26.  She developed agitated delirium overnight  7/26 and precedex was initiated by [facility]Link.  PCCM called back 7/28 to assess patient as she had coffee ground emesis overnight, concern for aspiration, hypotension that responded to 575m saline bolus x2.  As day progressed on 7/28, she had progressive abdominal pain.  CT of the abd/pelvis demonstrated bowel perforation.  She was intubated, had brief arrest post intubation (9 min) and was taken to the OR.  Returned to ICU on epi, neo, vaso, levo, bicarb.  Pressor need improved overnight into 7/29.    SUBJECTIVE:  EEG 8/2 normal , no seizure act.  Good Lasix response with Neg I/O bal (-2L ) remains off pressors - Weaning this am 12/5  VITAL SIGNS: BP 102/90   Pulse 98   Temp 99.8 F (37.7 C) (Axillary)   Resp (!) 21   Ht _0  (1.575 m)   Wt 203 lb 4.2 oz (92.2 kg)   SpO2 100%   BMI 37.18 kg/m   HEMODYNAMICS: CVP:  [7 mmHg-11 mmHg] 7 mmHg  VENTILATOR SETTINGS: Vent Mode: CPAP;PSV FiO2 (%):  [40 %] 40 % Set Rate:  [18 bmp] 18 bmp Vt Set:  [460 mL] 460 mL PEEP:  [5 cmH20] 5 cmH20 Pressure Support:  [12 cmH20] 12 cmH20 Plateau Pressure:  [16 cmH20-19 cmH20] 19 cmH20  INTAKE / OUTPUT:  Intake/Output Summary (Last 24 hours) at 07/18/2018 1005 Last data filed at 07/18/2018 0900 Gross per 24 hour  Intake 2409.45 ml  Output 4885 ml  Net -2475.55 ml    PHYSICAL EXAMINATION: General: Intermittent  agitation, response to pain, does not follow commands  HEENT: Atraumatic normocephalic, ETT Neuro: Responds to pain intermittent agitation does not follow commands, notable jerking movements /tardive dyskinesia  CV: Regular rate and rhythm PULM: Diminished breath sounds in the bases GI: Wound VAC in place ; ileostomy , absent bowel sounds  Extremities: Warm dry 2+ edema General  Skin: No rash     LABS:  BMET Recent Labs  Lab 07/16/18 0415 07/16/18 0910 07/17/18 0505  NA 146* 145 145  K 3.2* 3.9 3.6  CL 109 105 107  CO2 _0 BUN 46* 49* 55*  CREATININE 1.33* 1.41* 1.40*   GLUCOSE 105* 104* 221*    Electrolytes Recent Labs  Lab 07/16/18 0415 07/16/18 0910 07/17/18 0505  CALCIUM 7.6* 7.9* 8.0*  MG 1.9 1.8 2.1  PHOS 4.3 4.6 4.7*    CBC Recent Labs  Lab 07/16/18 0910  07/17/18 0736 07/17/18 1127 07/18/18 0341  WBC 21.7*  --   --  25.3* 24.7*  HGB 8.7*   < > 8.9* 9.4* 9.5*  HCT 28.2*   < > 29.1* 34.2* 31.2*  PLT 44*  --   --  73* 81*   < > = values in this interval not displayed.    Coag's Recent Labs  Lab 07/16/18 0415 07/17/18 0505 07/18/18 0341  INR 1.65 1.54 1.44    Sepsis Markers Recent Labs  Lab 07/12/18 2012 07/13/18 0125 07/13/18 0700 07/13/18 0949 07/13/18 1542 07/14/18 0658  LATICACIDVEN 8.4* 8.7*  --  6.0* 4.9*  --   PROCALCITON 21.93  --  55.17  --   --  34.83    ABG Recent Labs  Lab 07/12/18 2003 07/13/18 1010 07/14/18 0335  PHART 7.255* 7.471* 7.526*  PCO2ART 38.8 30.7* 34.0  PO2ART 66.0* 101 78.6*    Liver Enzymes Recent Labs  Lab 07/14/18 0658 07/16/18 0415 07/16/18 0910  AST 67* 66* 89*  ALT 83* 45* 51*  ALKPHOS 107 290* 352*  BILITOT 6.3* 7.6* 9.5*  ALBUMIN 1.9* 1.9* 2.2*    Cardiac Enzymes Recent Labs  Lab 07/12/18 1755 07/12/18 2012 07/12/18 2323  TROPONINI 0.07* 0.07* 0.07*    Glucose Recent Labs  Lab 07/17/18 1138 07/17/18 1553 07/17/18 2023 07/18/18 0007 07/18/18 0349 07/18/18 0825  GLUCAP 193* 197* 162* 168* 172* 180*    Imaging  STUDIES:  [place] ABD 7/6 >> possible cirrhosis, gallbladder surgically absent CT Head 7/9 >> atrophy with small vessel chronic ischemic changes of deep cerebral white matter CT Renal Study 7/9 >> no acute findings, bilateral non-obstructing renal stones ECHO 7/9 >> LV mildly dilated, wall thickness was increased in a pattern of moderate LVH, LVEF 30-35%, severe hypokinesis of the anteroseptal, anterior, inferoseptal & apical myocardium, mild to moderate MV regurgitation, LA severely dilated, RV moderately dilated, RA mod-severely dilated, PA  pressure 62mHg, trivial pericardial effusion. CTA Head, Neck 7/12 >> minimal calcified plaque at the carotid bifurcation regions but no stenosis or irregularity, no large vessel occlusions, unable to specifically identify the missing branch vessel responsible for the right posterior frontal infarction but this is presumably an occluded M3 or distal branch CT Head w/o 7/12 >> the small right middle cerebral artery distribution, posterolateral frontal lobe infarct has evolved since the prior CT, it is now seen as a well defined area of hypoattenuation, no new areas of infarct, no ICH CT head 7/15 >> Redemonstration of right posterior frontal lobe infarct. No acute intracranial hemorrhage or new infarct identified.  Atrophy with  chronic small vessel ischemia. CT abd/pelvis 7/18 > Diffuse dilation of the colon with large amount of ascending colonic stool.  CT ABD/Pelvis 7/28 >> free air in the abd consistent with bowel rupture, unable to identify source, bilateral pleural effusions (stable), non-obstructive kidney stones EEG 8/2 >no seizure act   CULTURES: BCx2 7/10 >> negative  Cdiff 7/20 >> negative  Sputum 7/20 >> negative Sputum 7/29>>Kleb Pne SS zoysn BC 7/28 >neg   ANTIBIOTICS: Vanco 7/15 >> 7/21 Zoysn 7/15 >> 7/22 Unasyn 7/28 >> 7/28 Diflucan 7/28 >> 7/31 Zosyn 7/28 >>  Vanco 7/28 >>7/31  SIGNIFICANT EVENTS: 7/09  Admit  7/15  PCCM consulted with AMS, acute liver failure 7/19  Had been receiving fluid challenges for hypotension. 7/20   Early in the a.m. hours drop blood pressure.  Placed on pressors.  Non-anion gap acidosis noted.  Cortisol 29.2 CBC remains elevated blood cell count remains elevated.  7/21  no significant fever.  White cell count cut in half.  Chest x-ray looks improved.  We resumed lactulose and rifaximin yesterday, also resumed spironolactone.  Still obtunded  7/22  Free water increased 7/23  800 ml in flexiseal overnight.  Off home PSY meds. Mg/K low.  Na 153 on  free water. Continues to have tardive dyskinesia.  7/24 Tmax 100.2.  I/O- positive 8.6L for admit / 2125 of stool out 7/23. 7/26  extubated on 7/25. Remains encephalopathic. NA still elevated  7/28  PCCM called back for hypotension on precedex, coffee ground emesis. Concern for aspiration, hypotension that responded to 577m saline bolus x2.  TF held overnight with nausea/abd pain.  NGT placed due to vomiting. ABD pain increased > CT abd with bowel perforation > to OR for resection/wash out Wound vac, /ileostomy    LINES/TUBES: ETT 7/15 >> 7/25 ETT 7/28 >>  L Rad Aline 7/28 >> out R IJ dual lumen 7/28 >>  DISCUSSION: 62y/o F admitted with weakness, decreased intake and lethargy.  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  She developed AMS > small infarct thought not related to AMS.  Recommended for anticoagulation by Cardiology/Neurology, was transitioned from heparin to eliquis.  The patient deteriorated on 7/15 with a rise in LFT's, AKI, confusion/AMS, worsening of tardive dyskinesia type symptoms.  Transferred to ICU.    ICU course significant for slow improvement of mental status (she actually looks the best I have seen her on 7/28).  She remains on room air with no distress.  She has had periods of agitation which I suspect are related to being off her maintenance PSY regimen (due to liver dysfunction). She has been seen by PSY (see recommendations).  Her electrolytes have difficult to manage in the setting of high stool output & poor nutritional status. She has been on free water and dextrose via IV. She pulled her flexiseal out multiple times overnight 7/28.  ABD pain worsened 7/28 during the day > CT ABD with perforation, to OR with Dr. BBarry Dienes  07/15/2018 to wean her altered mental status is a block to any attempt at extubation. 07/16/2018 TPN to start Patient to less agitated on 07/17/2008   ASSESSMENT / PLAN:  Active issues   Acute Hypoxic Respiratory Failure - in setting  of bowel perforation, shock Bilateral Pleural Effusions - suspect in setting of liver disease, HF, AKI Klebsiella pneumoniae in sputum P: Wean per protocol Mental status is a block to extubation May need tracheostomy in the future  Acute metabolic & hepatic encephalopathy / acute  delirium - improved CVA - Right Frontal Operculum infarct - felt related to AF/embolic but not all cause of mental status Right-sided weakness Seizure disorder-EEG 8/2 no seizure act  Schizophrenia Tardive Dyskinesia - improved 7/28  P: Cont Keppra/Vimpat   HFrEF - LVEF 30% AF with RVR>resolved, off cardizem  Hypotension - 7/28, suspect related to intravascular volume depletion, sedation>resolved off pressors  Hx HTN  8/3 >appears volume overload, good response with lasix , b/p tolerated. 16+ L positive since admission , wt up 47lbs since admit   P: Off anticoagulation Cont metoprolol  Repeat lasix 80m q6 x 2 dose (8/3)   AGMA - in setting of bowel perforation, shock -resolved  Fluid and Electrolyte imbalance: Hypernatremia / Hyperchloremia, Hypokalemia > improved  Recent Labs  Lab 07/16/18 0415 07/16/18 0910 07/17/18 0505  K 3.2* 3.9 3.6    P: Replete electrolytes as needed Currently on TPN   [place] Cirrhosis with Hepatic Encephalopathy  - unclear in what precipitated change in events LFTs/ decompensation 7/15 - 7/15 UKorealiver w/doppler with no evidence of portal venous thrombosis  -LFTs improved P: Intermittently follow-up LFTs  Perforated Viscus  At Risk Abscess Development  - mid descending colon perforation s/p partial colectomy , ileostomy  P:  Per general surgery Wound vac    Leukocytosis, questionable sepsis however etiology unclear.   Klebsiella pneumoniae in sputum - Antibiotics + antifungal restarted 7/28 with abd perforation  P: Continue antimicrobial therapy  Coagulopathy Thrombocytopenia plts 32->44>73>81 Anemia Recent Labs    07/17/18 1127 07/18/18 0341  HGB  9.4* 9.5*  plts 32 Lab Results  Component Value Date   INR 1.44 07/18/2018   INR 1.54 07/17/2018   INR 1.65 07/16/2018     P: Monitor for hemorrhage Tr cbc   Coffee Ground Emesis - new 7/28 Vomiting P: Continue Pepcid No further reports of coffee-ground emesis  DM 2 CBG (last 3)  Recent Labs    07/18/18 0007 07/18/18 0349 07/18/18 0825  GLUCAP 168* 172* 180*    P:  Continue sliding-scale insulin spike with initiation of TPN  Hx Breast Cancer P: Follow-up as outpatient  DVT prophylaxis: SCD's SUP: H2 BID Diet: NPO, TPN to started 07/16/2018, tube feeds started 07/17/2017 Activity: BR Disposition : ICU, critically ill   FAMILY  - Updates: [id number]/32019 no family at bedside at time of dictation    Calah Gershman NP-C  [facility]  [phone]  07/18/2018

## 2018-07-19 ENCOUNTER — Inpatient Hospital Stay: Payer: Self-pay

## 2018-07-19 LAB — BASIC METABOLIC PANEL
ANION GAP: 8 (ref 5–15)
Anion gap: 6 (ref 5–15)
BUN: 48 mg/dL — AB (ref 8–23)
BUN: 46 mg/dL — AB (ref 8–23)
CHLORIDE: 109 mmol/L (ref 98–111)
CHLORIDE: 110 mmol/L (ref 98–111)
CO2: 32 mmol/L (ref 22–32)
CO2: 31 mmol/L (ref 22–32)
CREATININE: 0.74 mg/dL (ref 0.44–1.00)
Calcium: 7.5 mg/dL — ABNORMAL LOW (ref 8.9–10.3)
Calcium: 7.7 mg/dL — ABNORMAL LOW (ref 8.9–10.3)
Creatinine, Ser: 0.8 mg/dL (ref 0.44–1.00)
GFR calc Af Amer: >60 mL/min (ref >60)
GFR calc Af Amer: >60 mL/min (ref >60)
GFR calc non Af Amer: >60 mL/min (ref >60)
GFR calc non Af Amer: >60 mL/min (ref >60)
GLUCOSE: 177 mg/dL — AB (ref 70–99)
Glucose, Bld: 207 mg/dL — ABNORMAL HIGH (ref 70–99)
POTASSIUM: 3.4 mmol/L — AB (ref 3.5–5.1)
Potassium: 5.4 mmol/L — ABNORMAL HIGH (ref 3.5–5.1)
SODIUM: 147 mmol/L — AB (ref 135–145)
SODIUM: 149 mmol/L — AB (ref 135–145)

## 2018-07-19 LAB — GLUCOSE, CAPILLARY
GLUCOSE-CAPILLARY: 187 mg/dL — AB (ref 70–99)
GLUCOSE-CAPILLARY: 217 mg/dL — AB (ref 70–99)
Glucose-Capillary: 179 mg/dL — ABNORMAL HIGH (ref 70–99)
Glucose-Capillary: 189 mg/dL — ABNORMAL HIGH (ref 70–99)
Glucose-Capillary: 183 mg/dL — ABNORMAL HIGH (ref 70–99)
Glucose-Capillary: 198 mg/dL — ABNORMAL HIGH (ref 70–99)
Glucose-Capillary: 183 mg/dL — ABNORMAL HIGH (ref 70–99)

## 2018-07-19 LAB — CBC
HCT: 27 % — ABNORMAL LOW (ref 36.0–46.0)
Hemoglobin: 8.4 g/dL — ABNORMAL LOW (ref 12.0–15.0)
MCH: 24.7 pg — ABNORMAL LOW (ref 26.0–34.0)
MCHC: 31.1 g/dL (ref 30.0–36.0)
MCV: 79.4 fL (ref 78.0–100.0)
PLATELETS: 70 10*3/uL — AB (ref 150–400)
RBC: 3.4 MIL/uL — ABNORMAL LOW (ref 3.87–5.11)
RDW: 28.7 % — ABNORMAL HIGH (ref 11.5–15.5)
WBC: 30.3 10*3/uL — AB (ref 4.0–10.5)

## 2018-07-19 LAB — PHOSPHORUS: Phosphorus: 3 mg/dL (ref 2.5–4.6)

## 2018-07-19 LAB — AMMONIA: Ammonia: 20 umol/L (ref 9–35)

## 2018-07-19 LAB — PROTIME-INR
INR: 1.57
Prothrombin Time: 18.6 seconds — ABNORMAL HIGH (ref 11.4–15.2)

## 2018-07-19 LAB — MAGNESIUM: MAGNESIUM: 1.9 mg/dL (ref 1.7–2.4)

## 2018-07-19 LAB — FIBRINOGEN: FIBRINOGEN: 312 mg/dL (ref 210–475)

## 2018-07-19 LAB — LACTIC ACID, PLASMA: Lactic Acid, Venous: 1.6 mmol/L (ref 0.5–1.9)

## 2018-07-19 MED ORDER — LACTULOSE 10 GM/15ML PO SOLN
20.0000 g | Freq: Two times a day (BID) | ORAL | Status: DC
  Administered 2018-07-19 – 2018-07-23 (×7): 20 g via ORAL
  Filled 2018-07-19 (×8): qty 30

## 2018-07-19 MED ORDER — ARTIFICIAL TEARS OPHTHALMIC OINT
TOPICAL_OINTMENT | Freq: Three times a day (TID) | OPHTHALMIC | Status: DC
  Administered 2018-07-19 (×2): 1 via OPHTHALMIC
  Administered 2018-07-20: 14:00:00 via OPHTHALMIC
  Administered 2018-07-20 (×2): 1 via OPHTHALMIC
  Administered 2018-07-21: 21:00:00 via OPHTHALMIC
  Administered 2018-07-21: 1 via OPHTHALMIC
  Administered 2018-07-21 – 2018-07-22 (×2): via OPHTHALMIC
  Administered 2018-07-22: 1 via OPHTHALMIC
  Administered 2018-07-22 – 2018-07-23 (×3): via OPHTHALMIC
  Filled 2018-07-19 (×3): qty 3.5

## 2018-07-19 MED ORDER — VITAL AF 1.2 CAL PO LIQD
1500.0000 mL | ORAL | Status: DC
  Administered 2018-07-19: 1500 mL
  Filled 2018-07-19: qty 1500

## 2018-07-19 MED ORDER — LACTULOSE 10 GM/15ML PO SOLN: 20.0000 g | Freq: Three times a day (TID) | ORAL | Status: DC

## 2018-07-19 MED ORDER — ZINC TRACE METAL 1 MG/ML IV SOLN
INTRAVENOUS | Status: AC
  Administered 2018-07-19: 18:00:00 via INTRAVENOUS
  Filled 2018-07-19: qty 471.6

## 2018-07-19 NOTE — Progress Notes (Signed)
[facility] CONSULT NOTE   Pharmacy Consult for TPN Indication: Post-operative ileus  Patient Measurements: Height: '5\' 2"'  (157.5 cm) Weight: 200 lb 2.8 oz (90.8 kg) IBW/kg (Calculated) : 50.1 TPN AdjBW (KG): 52.8 Body mass index is 36.61 kg/m. Usual Weight: ~72 kg  Assessment: 62 year old female admitted 06/23/18 with weakness, anorexia, and lethargy in setting of NASH cirrhosis and hepatic encephalopathy with new onset Afib with RVR and HFrEF (30-35%). On 7/15, patient's clinical status worsened and increased confusion and multiorgan failure including respiratory failure requiring intubation. Patient improved and was extubated 7/25. However, patient had subsequent cardiac arrest on 7/28 and found to have perforation of descending colon and near perforation of cecum requiring OR for lleocecectomy and partial colectomy with discontinuity and open abdomen. She returned to the OR on 7/30 with creation of end ileostomy and closure of abdomen. Patient has been NPO >7 days and with limited nutrition since admission (22 days) therefore is at high risk for refeeding. Patient has Cortrak and NGT in place with minimal output from NGT. Surgery holding on restarting TF until output from ostomy.   GI: Ostomy with 120 mL output. NGT output 350 mL/24hr. Wound output 550 mL/24hr.  Pre-albumin <5. Albumin 2.2. LBM 8/3.  Endo: Hx DM- no meds PTA. CBGs 177-205 on resistant SSI q4h + 15 units of insulin in TPN. Off steroids since 8/1, TPN + TF Insulin requirements in the past 24 hours: 22 units since new TPN with insulin Lytes: Na up at 147, K 5.4 (Hemolyzed), Phos improved to 3. Mg 1.9. CoCa 9.4. *Receiving Lasix. Renal: SCR improved to 0.74 and BUN down 48. UOP 1.4 mL/kg/hr. IVF- LR at 65m/hr.  I/O -1.9L s/p Lasix 40 IV q6 x2 doses. Net + 16L. Pulm: Vent, FiO2 40% d/t inability to protect airway. Cards: Off Pressors. CVP 8. Afib with HR 80-90s on scheduled Metoprolol IV, resumed  Diltiazem drip at 5 mg/hr. Required significant transfusions 7/28 when perforated colon.  Currently, Hgb down to 8.4 and Plts 70. Hepatobil: AST/ALT trending down. Alk Phos rising 710. Tbili rising at 10.1. Rifaximin restarted.  Neuro: Hepatic encephalopathy- RASS 1, GCS 8. Sedated on Fentanyl at 1264m/hr. Sz history on Vimpat + Keppra.  ID: Zosyn day #5. WBC up at 30.3, Afebrile. PCT up 34.8 on 7/29. LA down from 8.7 to 4.9 on 7/29.  TPN Access: Double-lumen PICC placed 7/26 TPN start date: 8/1 Nutritional Goals (per RD recommendation on 8/2): KCal: 1589 kcal Protein: 100-125g Fluid: >/= 1.5L   Goal TPN rate (without TF) is 70 ml/hr (provides 100% of needs)  Current Nutrition:  TPN Vital AF 1.2 increasing to 30 mL/hr (provides  864 kcal, 54g protein)  Plan:  Wean TPN to 3057mr and likely stop tomorrow.  Hold 20% lipid emulsion for first 7 days for ICU patients per ASPEN guidelines (Start date 8/8). This TPN provides 47 g of protein, 137 g of dextrose, and 0 g of lipids which provides 654 kCals per day -With TPN + current TF, providing 101 g of protein and 1518 kcal meeting 100% protein needs and 95% kcal needs Electrolytes in TPN: Decrease Na, continue current K, Phos, and Mg. Cl:Ac 1:1 for now.  Add MVI to TPN. Will not add trace elements due to elevated Tbili, but will add Zinc 5mg17my, Chromium 10 mcg/day, and Selenium 60 mcg/day. Add Pepcid to TPN.  Continue 15 units of regular insulin to TPN.  Continue resistant SSI q4h and adjust as needed. Monitor  TPN labs, CMET, Mg and Phos in AM.  F/U for output from ostomy and ability to advance TF via Cortrak tube.     Sloan Leiter, PharmD, BCPS, BCCCP Clinical Pharmacist Clinical phone 07/19/2018 until 3:30PM [phone] Please refer to AMION 07/19/2018,7:53 AM

## 2018-07-19 NOTE — Progress Notes (Signed)
Patient ID: Brandi [name], female   DOB: 1956/03/13, 62 y.o.   MRN: [id number]   Acute Care Surgery Service Progress Note:    Chief Complaint/Subjective: Intubated/sedated Low grade temps Went into rapid afib- started on cardizem gtt Wbc going up  Objective: Vital signs in last 24 hours: Temp:  [99.5 F (37.5 C)-100.2 F (37.9 C)] 99.5 F (37.5 C) (08/04 0750) Pulse Rate:  [39-119] 91 (08/04 0744) Resp:  [15-25] 21 (08/04 0744) BP: (76-157)/(41-121) 141/63 (08/04 0744) SpO2:  [99 %-100 %] 100 % (08/04 0744) FiO2 (%):  [40 %] 40 % (08/04 0750) Weight:  [90.8 kg (200 lb 2.8 oz)] 90.8 kg (200 lb 2.8 oz) (08/04 0500) Last BM Date: 07/18/18(ileostomy)  Intake/Output from previous day: 08/03 0701 - 08/04 0700 In: 2608.6 [I.V.:1367.6; NG/GT:460; IV Piggyback:781] Out: 4268 [Urine:4795; Emesis/NG output:350; Drains:550; Stool:120] Intake/Output this shift: No intake/output data recorded.   Abd: soft, some distension, wound vac on midline; air/stool in ileostomy; still with some bilious ng output  Extremities:  edema, +SCDs  Neuro: min responsive  Lab Results: CBC  Recent Labs    07/18/18 0341 07/19/18 0336  WBC 24.7* 30.3*  HGB 9.5* 8.4*  HCT 31.2* 27.0*  PLT 81* 70*   BMET Recent Labs    07/18/18 0930 07/19/18 0524  NA 147* 147*  K 3.1* 5.4*  CL 111 109  CO2 29 32  GLUCOSE 202* 177*  BUN 53* 48*  CREATININE 0.99 0.74  CALCIUM 7.7* 7.5*   LFT Hepatic Function Latest Ref Rng & Units 07/18/2018 07/16/2018 07/16/2018  Total Protein 6.5 - 8.1 g/dL 3.7(L) 4.0(L) 3.6(L)  Albumin 3.5 - 5.0 g/dL 1.6(L) 2.2(L) 1.9(L)  AST 15 - 41 U/L 72(H) 89(H) 66(H)  ALT 0 - 44 U/L 50(H) 51(H) 45(H)  Alk Phosphatase 38 - 126 U/L 710(H) 352(H) 290(H)  Total Bilirubin 0.3 - 1.2 mg/dL 10.1(H) 9.5(H) 7.6(H)  Bilirubin, Direct 0.0 - 0.2 mg/dL - - -   PT/INR Recent Labs    07/18/18 0341 07/19/18 0336  LABPROT 17.4* 18.6*  INR 1.44 1.57   ABG No results for input(s): PHART,  HCO3 in the last 72 hours.  Invalid input(s): PCO2, PO2  Studies/Results:  Anti-infectives: Anti-infectives (From admission, onward)   Start     Dose/Rate Route Frequency Ordered Stop   07/17/18 1600  rifaximin (XIFAXAN) tablet 550 mg     550 mg Oral 3 times daily 07/17/18 1254     07/14/18 0600  ceFAZolin (ANCEF) IVPB 2g/100 mL premix     2 g 200 mL/hr over 30 Minutes Intravenous On call to O.R. 07/13/18 2234 07/14/18 1052   07/13/18 2100  fluconazole (DIFLUCAN) IVPB 200 mg  Status:  Discontinued     200 mg 100 mL/hr over 60 Minutes Intravenous Every 24 hours 07/12/18 1958 07/15/18 1224   07/13/18 1800  vancomycin (VANCOCIN) IVPB 1000 mg/200 mL premix  Status:  Discontinued     1,000 mg 200 mL/hr over 60 Minutes Intravenous Every 24 hours 07/12/18 1549 07/15/18 1224   07/13/18 0100  piperacillin-tazobactam (ZOSYN) IVPB 3.375 g     3.375 g 12.5 mL/hr over 240 Minutes Intravenous Every 8 hours 07/12/18 1549     07/12/18 2100  fluconazole (DIFLUCAN) IVPB 400 mg     400 mg 100 mL/hr over 120 Minutes Intravenous  Once 07/12/18 1958 07/12/18 2306   07/12/18 1600  vancomycin (VANCOCIN) 1,500 mg in sodium chloride 0.9 % 500 mL IVPB     1,500 mg 250 mL/hr  over 120 Minutes Intravenous STAT 07/12/18 1549 07/12/18 1829   07/12/18 1600  piperacillin-tazobactam (ZOSYN) IVPB 3.375 g     3.375 g 100 mL/hr over 30 Minutes Intravenous STAT 07/12/18 1549 07/12/18 1701   07/12/18 0800  ampicillin-sulbactam (UNASYN) 1.5 g in sodium chloride 0.9 % 100 mL IVPB  Status:  Discontinued     1.5 g 200 mL/hr over 30 Minutes Intravenous Every 6 hours 07/12/18 0715 07/12/18 1538   07/04/18 1000  rifaximin (XIFAXAN) tablet 550 mg  Status:  Discontinued     550 mg Per Tube 3 times daily 07/04/18 0925 07/12/18 1647   06/30/18 2200  vancomycin (VANCOCIN) IVPB 1000 mg/200 mL premix  Status:  Discontinued     1,000 mg 200 mL/hr over 60 Minutes Intravenous Every 24 hours 06/30/18 2008 07/04/18 0925   06/30/18  2200  piperacillin-tazobactam (ZOSYN) IVPB 3.375 g  Status:  Discontinued     3.375 g 12.5 mL/hr over 240 Minutes Intravenous Every 8 hours 06/30/18 2011 07/06/18 1149   06/30/18 0000  vancomycin variable dose per unstable renal function (pharmacist dosing)  Status:  Discontinued      Does not apply See admin instructions 06/29/18 1229 07/03/18 0747   06/29/18 1300  piperacillin-tazobactam (ZOSYN) IVPB 2.25 g  Status:  Discontinued     2.25 g 100 mL/hr over 30 Minutes Intravenous Every 8 hours 06/29/18 1229 06/30/18 2011   06/29/18 1300  vancomycin (VANCOCIN) 1,250 mg in sodium chloride 0.9 % 250 mL IVPB     1,250 mg 166.7 mL/hr over 90 Minutes Intravenous  Once 06/29/18 1229 06/29/18 2234      Medications: Scheduled Meds: . chlorhexidine gluconate (MEDLINE KIT)  15 mL Mouth Rinse BID  . Chlorhexidine Gluconate Cloth  6 each Topical Daily  . insulin aspart  0-20 Units Subcutaneous Q4H  . mouth rinse  15 mL Mouth Rinse 10 times per day  . metoprolol tartrate  5 mg Intravenous Q6H  . rifaximin  550 mg Oral TID  . sodium chloride flush  10-40 mL Intracatheter Q12H   Continuous Infusions: . diltiazem (CARDIZEM) infusion 5 mg/hr (07/19/18 0540)  . feeding supplement (VITAL AF 1.2 CAL) 20 mL/hr at 07/19/18 0600  . fentaNYL infusion INTRAVENOUS 125 mcg/hr (07/19/18 0600)  . lacosamide (VIMPAT) IV Stopped (07/18/18 2142)  . levETIRAcetam Stopped (07/19/18 0039)  . piperacillin-tazobactam (ZOSYN)  IV 3.375 g (07/19/18 0811)  . TPN ADULT (ION) 45 mL/hr at 07/19/18 0600   PRN Meds:.albuterol, fentaNYL, LORazepam, [DISCONTINUED] ondansetron **OR** ondansetron (ZOFRAN) IV, sodium chloride flush  Assessment/Plan: Patient Active Problem List   Diagnosis Date Noted  . Perforation of colon s/p partial colectomyx2 on 07/12/2018 07/13/2018  . Perforated viscus   . Liver failure ([place])   . Acute respiratory failure ([place])   . Shock circulatory ([place])   . Acute encephalopathy   . Upper GI bleed     . Paroxysmal atrial fibrillation (H[facility])   . Cerebral embolism with cerebral infarction 06/26/2018  . Atrial fibrillation with RVR ([place]) 06/23/2018  . Schizophrenia ([place]) 06/23/2018  . History of seizures 06/23/2018  . Nausea and vomiting 06/23/2018  . SOB (shortness of breath) 06/12/2018  . Abnormal EKG 06/12/2018  . Elevated LFTs 06/12/2018  . Malaise 06/12/2018  . Hypertension associated with diabetes ([place]) 12/05/2016  . Anemia 12/05/2016  . Convulsions/seizures ([place]) 11/02/2014  . Type 2 diabetes mellitus without complication, without long-term current use of insulin ([place]) 03/16/2014  . Leukocytosis 06/29/2013  . Thrombocytosis ([place]) 06/29/2013  .  Major depression 09/10/2011  . Migraine headache 09/10/2011  . Obesity (BMI 30-39.9) 09/10/2011  . GERD (gastroesophageal reflux disease) 09/10/2011  . Allergic rhinitis due to pollen 09/10/2011  . History of breast cancer in female 09/10/2011  . Hyperlipidemia associated with type 2 diabetes mellitus ([place]) 09/10/2011   s/p Procedure(s): EXPLORATORY LAPAROTOMY (N/A) TRANSVERSE COLON RESECTION (N/A) CREATION OF ILEOSTOMY (N/A) APPLICATION OF WOUND VAC (change) (N/A) [place] cirrhosis with hepatic encephalopathy New onset atrial fibrillation with rapid ventricular rate New onset cardiomyopathy with EF 30 to 35% NewRight frontal operculum infarct History of seizure disorder Hypertension Type 2 diabetes well controlled History of schizophrenia  Septic shock due to perforated descending colon - WBC now up to 30, low grade temp - off pressors S/p ex-lap with ileocecectomy and partial descending colectomy left in discontinuity with abdominal wound VAC placement 07/12/18 Dr. Barry Dienes -  S/p ex-lap partial colectomy with closure of abdomen and creation of end ileostomy 07/14/18 Dr. Brantley Stage  -some output from ostomy but still with about 350 bilious output - can increase tube feed to 30cc/hr; can start weaning  TPN - 350 cc out of NGT in  24h, continue on LIWS - VAC to be changed Mon   FEN: IVF, can start weaningTPN; NGT to LIWS; can increase tf to 30cc/hr  VTE: SCDs ID: current abx - Zosyn 7/29>>  Disposition: can give lactulose for hepatic encephalopathy - will need to monitor ostomy output  LOS: 26 days    Leighton Ruff. Redmond Pulling, MD, FACS General, Bariatric, & Minimally Invasive Surgery [phone] [facility]

## 2018-07-19 NOTE — Progress Notes (Addendum)
PULMONARY / CRITICAL CARE MEDICINE   Name: Brandi [name] MRN: [id number] DOB: 1956-10-06    ADMISSION DATE:  06/23/2018 CONSULTATION DATE: 06/29/2018  REFERRING MD: Dr. Bonner Puna  CHIEF COMPLAINT: Altered mental status  HISTORY OF PRESENT ILLNESS:  62 y/o F who presented to [facility] on 7/9 with abdominal pain, nausea, vomiting, decreased oral intake and weakness.    She was reportedly evaluated by her PCP prior to admit with elevated LFT's (AST 302, ALT 611, AP 380).  Follow up CT of the abdomen demonstrated prominent caudate lobe and surface irregularity of the liver.   In the ER, US of the abdomen confirmed the CT findings of the liver.  She was noted to be lethargic and have asterixis on exam.  Ammonia 22 (7/10) > 38 (7/12).  The patient had AFwRVR and was treated with cardizem.  She was admitted for further evaluation.  The patient was seen by Cardiology and transitioned to metoprolol for rate control due to decreased LVEF (30-35%). Labs showed a microcytic anemia with ferritin of 9.  GI was consulted for evaluation with recommendations for lactulose and eventual endoscopic evaluation.  She had ongoing encephalopathy and a CT of the head was obtained which showed a right frontal operculum CVA.  Neurology evaluated the patient and felt the stroke was likely related to AF but not the cause of her mental status.  She was recommended anticoagulation and passed a swallow evaluation converting from heparin to eliquis.  On 7/15, her confusion, tardive dyskinesia symptoms and lethargy worsened as well as her LFT's (AST 3942, ALT 1976, ALK Phos 404), renal function and WBC.  PCCM consulted for evaluation.  Anticoagulation stopped due to shock liver.  PSY meds were held due to severe tardive dyskinesia and liver dysfunction.  She had prolonged intubation due to poor mental status.  The patient was extubated on 7/25 without difficulty.  She was transitioned to [facility] on 7/26.  She developed agitated delirium overnight  7/26 and precedex was initiated by [facility]Link.  PCCM called back 7/28 to assess patient as she had coffee ground emesis overnight, concern for aspiration, hypotension that responded to 571m saline bolus x2.  As day progressed on 7/28, she had progressive abdominal pain.  CT of the abd/pelvis demonstrated bowel perforation.  She was intubated, had brief arrest post intubation (9 min) and was taken to the OR.  Returned to ICU on epi, neo, vaso, levo, bicarb.  Pressor need improved overnight into 7/29.    SUBJECTIVE:  Afib w/ RVR overnight , Cardizem restarted  Low grade fevers/wbc tr up 30K  gen surg this am , can increase TF to 30 and decrease TPN  Family at bedside , updated  NGT w/ Bilious output (350cc/24)   VITAL SIGNS: BP (!) 141/63   Pulse 91   Temp 99.5 F (37.5 C) (Axillary)   Resp (!) 21   Ht 5' 2" (1.575 m)   Wt 200 lb 2.8 oz (90.8 kg)   SpO2 100%   BMI 36.61 kg/m   HEMODYNAMICS: CVP:  [2 mmHg-8 mmHg] 5 mmHg  VENTILATOR SETTINGS: Vent Mode: CPAP;PSV FiO2 (%):  [40 %] 40 % Set Rate:  [18 bmp] 18 bmp Vt Set:  [460 mL] 460 mL PEEP:  [5 cmH20] 5 cmH20 Pressure Support:  [10 cmH20-12 cmH20] 10 cmH20 Plateau Pressure:  [14 cmH20-15 cmH20] 14 cmH20  INTAKE / OUTPUT:  Intake/Output Summary (Last 24 hours) at 07/19/2018 0851 Last data filed at 07/19/2018 0600 Gross per 24 hour  Intake 2526.19 ml  Output 5515 ml  Net -2988.81 ml    PHYSICAL EXAMINATION: General: Intermittent agitation, responses pain does not follow commands  HEENT: AT [place], ETT, sclera icteric  Neuro: Intermittent agitation, does not follow commands, jerking movement/tardive dyskinesia  CV: Irregular, A. fib PULM: Diminished BS in bases  GI: Wound VAC in place serous to orange drainage, ileostomy with stool output, NG tube with bilious output  Extremities: Warm, dry with general edema 1-2+   Skin: No rash, wound VAC mid abdomen as above     LABS:  BMET Recent Labs  Lab 07/17/18 0505 07/18/18 0930  07/19/18 0524  NA 145 147* 147*  K 3.6 3.1* 5.4*  CL 107 111 109  CO2 26 29 32  BUN 55* 53* 48*  CREATININE 1.40* 0.99 0.74  GLUCOSE 221* 202* 177*    Electrolytes Recent Labs  Lab 07/17/18 0505 07/18/18 0930 07/19/18 0524  CALCIUM 8.0* 7.7* 7.5*  MG 2.1 1.5* 1.9  PHOS 4.7* 3.5 3.0    CBC Recent Labs  Lab 07/17/18 1127 07/18/18 0341 07/19/18 0336  WBC 25.3* 24.7* 30.3*  HGB 9.4* 9.5* 8.4*  HCT 34.2* 31.2* 27.0*  PLT 73* 81* 70*    Coag's Recent Labs  Lab 07/17/18 0505 07/18/18 0341 07/19/18 0336  INR 1.54 1.44 1.57    Sepsis Markers Recent Labs  Lab 07/12/18 2012 07/13/18 0125 07/13/18 0700 07/13/18 0949 07/13/18 1542 07/14/18 0658  LATICACIDVEN 8.4* 8.7*  --  6.0* 4.9*  --   PROCALCITON 21.93  --  55.17  --   --  34.83    ABG Recent Labs  Lab 07/12/18 2003 07/13/18 1010 07/14/18 0335  PHART 7.255* 7.471* 7.526*  PCO2ART 38.8 30.7* 34.0  PO2ART 66.0* 101 78.6*    Liver Enzymes Recent Labs  Lab 07/16/18 0415 07/16/18 0910 07/18/18 0930  AST 66* 89* 72*  ALT 45* 51* 50*  ALKPHOS 290* 352* 710*  BILITOT 7.6* 9.5* 10.1*  ALBUMIN 1.9* 2.2* 1.6*    Cardiac Enzymes Recent Labs  Lab 07/12/18 1755 07/12/18 2012 07/12/18 2323  TROPONINI 0.07* 0.07* 0.07*    Glucose Recent Labs  Lab 07/18/18 1140 07/18/18 1550 07/18/18 2017 07/19/18 0002 07/19/18 0337 07/19/18 0751  GLUCAP 198* 226* 205* 187* 179* 189*    Imaging  STUDIES:  [place] ABD 7/6 >> possible cirrhosis, gallbladder surgically absent CT Head 7/9 >> atrophy with small vessel chronic ischemic changes of deep cerebral white matter CT Renal Study 7/9 >> no acute findings, bilateral non-obstructing renal stones ECHO 7/9 >> LV mildly dilated, wall thickness was increased in a pattern of moderate LVH, LVEF 30-35%, severe hypokinesis of the anteroseptal, anterior, inferoseptal & apical myocardium, mild to moderate MV regurgitation, LA severely dilated, RV moderately dilated,  RA mod-severely dilated, PA pressure 22mHg, trivial pericardial effusion. CTA Head, Neck 7/12 >> minimal calcified plaque at the carotid bifurcation regions but no stenosis or irregularity, no large vessel occlusions, unable to specifically identify the missing branch vessel responsible for the right posterior frontal infarction but this is presumably an occluded M3 or distal branch CT Head w/o 7/12 >> the small right middle cerebral artery distribution, posterolateral frontal lobe infarct has evolved since the prior CT, it is now seen as a well defined area of hypoattenuation, no new areas of infarct, no ICH CT head 7/15 >> Redemonstration of right posterior frontal lobe infarct. No acute intracranial hemorrhage or new infarct identified.  Atrophy with chronic small vessel ischemia. CT abd/pelvis 7/18 >  Diffuse dilation of the colon with large amount of ascending colonic stool.  CT ABD/Pelvis 7/28 >> free air in the abd consistent with bowel rupture, unable to identify source, bilateral pleural effusions (stable), non-obstructive kidney stones EEG 8/2 >no seizure act   CULTURES: BCx2 7/10 >> negative  Cdiff 7/20 >> negative  Sputum 7/20 >> negative Sputum 7/29>>Kleb Pne SS zoysn BC 7/28 >neg   ANTIBIOTICS: Vanco 7/15 >> 7/21 Zoysn 7/15 >> 7/22 Unasyn 7/28 >> 7/28 Diflucan 7/28 >> 7/31 Zosyn 7/28 >>  Vanco 7/28 >>7/31  SIGNIFICANT EVENTS: 7/09  Admit  7/15  PCCM consulted with AMS, acute liver failure 7/19  Had been receiving fluid challenges for hypotension. 7/20   Early in the a.m. hours drop blood pressure.  Placed on pressors.  Non-anion gap acidosis noted.  Cortisol 29.2 CBC remains elevated blood cell count remains elevated.  7/21  no significant fever.  White cell count cut in half.  Chest x-ray looks improved.  We resumed lactulose and rifaximin yesterday, also resumed spironolactone.  Still obtunded  7/22  Free water increased 7/23  800 ml in flexiseal overnight.  Off home PSY  meds. Mg/K low.  Na 153 on free water. Continues to have tardive dyskinesia.  7/24 Tmax 100.2.  I/O- positive 8.6L for admit / 2125 of stool out 7/23. 7/26  extubated on 7/25. Remains encephalopathic. NA still elevated  7/28  PCCM called back for hypotension on precedex, coffee ground emesis. Concern for aspiration, hypotension that responded to 59m saline bolus x2.  TF held overnight with nausea/abd pain.  NGT placed due to vomiting. ABD pain increased > CT abd with bowel perforation > to OR for resection/wash out Wound vac, /ileostomy    LINES/TUBES: ETT 7/15 >> 7/25 ETT 7/28 >>  L Rad Aline 7/28 >> out R IJ dual lumen 7/28 >>  DISCUSSION: 62y/o F admitted with weakness, decreased intake and lethargy.  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  She developed AMS > small infarct thought not related to AMS.  Recommended for anticoagulation by Cardiology/Neurology, was transitioned from heparin to eliquis.  The patient deteriorated on 7/15 with a rise in LFT's, AKI, confusion/AMS, worsening of tardive dyskinesia type symptoms.  Transferred to ICU.    ICU course significant for slow improvement of mental status (she actually looks the best I have seen her on 7/28).  She remains on room air with no distress.  She has had periods of agitation which I suspect are related to being off her maintenance PSY regimen (due to liver dysfunction). She has been seen by PSY (see recommendations).  Her electrolytes have difficult to manage in the setting of high stool output & poor nutritional status. She has been on free water and dextrose via IV. She pulled her flexiseal out multiple times overnight 7/28.  ABD pain worsened 7/28 during the day > CT ABD with perforation, to OR with Dr. BBarry Dienes  07/15/2018 to wean her altered mental status is a block to any attempt at extubation. 07/16/2018 TPN to start Patient to less agitated on 07/17/2008   ASSESSMENT / PLAN:  Active issues   Acute Hypoxic  Respiratory Failure - in setting of bowel perforation, shock Bilateral Pleural Effusions - suspect in setting of liver disease, HF, AKI Klebsiella pneumoniae in sputum P: Wean per protocol Mental status is a block to extubation May need tracheostomy in the future Check cxr in am   Acute metabolic & hepatic encephalopathy / acute delirium - improved  CVA - Right Frontal Operculum infarct - felt related to AF/embolic but not all cause of mental status Right-sided weakness Seizure disorder-EEG 8/2 no seizure act  Schizophrenia Tardive Dyskinesia - improved 7/28  P: Cont Keppra/Vimpat   HFrEF - LVEF 30% AF with RVR>recurrent 8/4 >cardizem restarted  Hypotension - 7/28, suspect related to intravascular volume depletion, sedation>resolved off pressors  Hx HTN  8/4> good response with lasix , -3 L x 24hr , great UOP b/p tolerated. 14+ L positive since admission , wt decrease 3 lbs x 24hr, CVP 5   P: Off anticoagulation Cont metoprolol  Hold on lasix today  Cardizem restarted 8/4    AGMA - in setting of bowel perforation, shock -resolved  Fluid and Electrolyte imbalance: Hypernatremia / Hyperchloremia, Hypokalemia > improved  8/4 K elevated ? Hemolyzed , repeat  Recent Labs  Lab 07/17/18 0505 07/18/18 0930 07/19/18 0524  K 3.6 3.1* 5.4*    P: Replete electrolytes as needed D/c TPN as TF at 30cc/hr now  Repeat bmet ? Hemolyzed    [place] Cirrhosis with Hepatic Encephalopathy  - unclear in what precipitated change in events LFTs/ decompensation 7/15 - 7/15 US liver w/doppler with no evidence of portal venous thrombosis  -LFTs improved P: Intermittently follow-up LFTs Restart lactulose 20g tid   Perforated Viscus  At Risk Abscess Development  - mid descending colon perforation s/p partial colectomy , ileostomy  P:  Per general surgery Wound vac  Temp/wbc tr up , may need repeat CT abd  Check lactate    Leukocytosis, questionable sepsis however etiology unclear.    Klebsiella pneumoniae in sputum - Antibiotics + antifungal restarted 7/28 with abd perforation  P: Continue antimicrobial therapy  Coagulopathy Thrombocytopenia plts 32->44>73>81>70 Anemia Recent Labs    07/18/18 0341 07/19/18 0336  HGB 9.5* 8.4*  plts 32 Lab Results  Component Value Date   INR 1.57 07/19/2018   INR 1.44 07/18/2018   INR 1.54 07/17/2018     P: Monitor for hemorrhage Tr cbc  SCD   Coffee Ground Emesis - new 7/28 Vomiting P: Continue Pepcid No further reports of coffee-ground emesis NG to IWS   DM 2 CBG (last 3)  Recent Labs    07/19/18 0002 07/19/18 0337 07/19/18 0751  GLUCAP 187* 179* 189*    P:  Continue sliding-scale insulin spike with initiation of TPN  Hx Breast Cancer P: Follow-up as outpatient  DVT prophylaxis: SCD's SUP: H2 BID Diet: NPO, TPN to started 07/16/2018, tube feeds started 07/17/2017 Activity: BR Disposition : ICU, critically ill   FAMILY  - Updates: 8/42019 family updated at bedside     Monterrius Cardosa NP-C  [facility]  [phone]   07/19/2018

## 2018-07-19 NOTE — Progress Notes (Signed)
Spoke with RN and Dr Lake Bells re PICC order.  PICC order cancelled.

## 2018-07-19 NOTE — Progress Notes (Signed)
eLink Physician-Brief Progress Note Patient Name: Brandi [name] DOB: May 06, 1956 MRN: [id number]   Date of Service  07/19/2018  HPI/Events of Note  Recurrent AFIB with RVR - Ventricular rate = 143. BP = 124/76.  eICU Interventions  Will order: 1. Restart Cardizem IV infusion. Titrate to HR = 65-105.      Intervention Category Major Interventions: Arrhythmia - evaluation and management  [name] Eugene 07/19/2018, 1:44 AM

## 2018-07-20 ENCOUNTER — Encounter (HOSPITAL_COMMUNITY)
Admission: EM | Disposition: A | Discharge status: Nursing Home (Includes ICF) | Payer: Self-pay | Source: Home / Self Care | Attending: Pulmonary Disease

## 2018-07-20 ENCOUNTER — Inpatient Hospital Stay (HOSPITAL_COMMUNITY): Payer: Medicare Other | Admitting: Certified Registered"

## 2018-07-20 ENCOUNTER — Inpatient Hospital Stay (HOSPITAL_COMMUNITY): Payer: Medicare Other

## 2018-07-20 HISTORY — PX: LAPAROTOMY: SHX154

## 2018-07-20 LAB — COMPREHENSIVE METABOLIC PANEL
ALK PHOS: 505 U/L — AB (ref 38–126)
ALT: 60 U/L — ABNORMAL HIGH (ref 0–44)
ANION GAP: 7 (ref 5–15)
AST: 74 U/L — ABNORMAL HIGH (ref 15–41)
Albumin: 1.6 g/dL — ABNORMAL LOW (ref 3.5–5.0)
BILIRUBIN TOTAL: 8.4 mg/dL — AB (ref 0.3–1.2)
BUN: 47 mg/dL — ABNORMAL HIGH (ref 8–23)
CALCIUM: 7.8 mg/dL — AB (ref 8.9–10.3)
CO2: 30 mmol/L (ref 22–32)
Chloride: 113 mmol/L — ABNORMAL HIGH (ref 98–111)
Creatinine, Ser: 0.72 mg/dL (ref 0.44–1.00)
GFR calc Af Amer: >60 mL/min (ref >60)
Glucose, Bld: 179 mg/dL — ABNORMAL HIGH (ref 70–99)
Potassium: 2.9 mmol/L — ABNORMAL LOW (ref 3.5–5.1)
Sodium: 150 mmol/L — ABNORMAL HIGH (ref 135–145)
Total Protein: 3.8 g/dL — ABNORMAL LOW (ref 6.5–8.1)

## 2018-07-20 LAB — DIFFERENTIAL
BASOS PCT: 0 %
Basophils Absolute: 0 10*3/uL (ref 0.0–0.1)
EOS ABS: 0.4 10*3/uL (ref 0.0–0.7)
EOS PCT: 1 %
LYMPHS ABS: 1.2 10*3/uL (ref 0.7–4.0)
Lymphocytes Relative: 3 %
MONO ABS: 1.2 10*3/uL — AB (ref 0.1–1.0)
Monocytes Relative: 3 %
NEUTROS PCT: 93 %
Neutro Abs: 36 10*3/uL — ABNORMAL HIGH (ref 1.7–7.7)

## 2018-07-20 LAB — GLUCOSE, CAPILLARY
GLUCOSE-CAPILLARY: 158 mg/dL — AB (ref 70–99)
GLUCOSE-CAPILLARY: 201 mg/dL — AB (ref 70–99)
GLUCOSE-CAPILLARY: 158 mg/dL — AB (ref 70–99)
Glucose-Capillary: 150 mg/dL — ABNORMAL HIGH (ref 70–99)
Glucose-Capillary: 158 mg/dL — ABNORMAL HIGH (ref 70–99)
Glucose-Capillary: 166 mg/dL — ABNORMAL HIGH (ref 70–99)
Glucose-Capillary: 177 mg/dL — ABNORMAL HIGH (ref 70–99)

## 2018-07-20 LAB — TRIGLYCERIDES: Triglycerides: 197 mg/dL — ABNORMAL HIGH (ref <150)

## 2018-07-20 LAB — LACTIC ACID, PLASMA
Lactic Acid, Venous: 1.9 mmol/L (ref 0.5–1.9)
Lactic Acid, Venous: 2 mmol/L (ref 0.5–1.9)

## 2018-07-20 LAB — CBC
HCT: 26 % — ABNORMAL LOW (ref 36.0–46.0)
Hemoglobin: 8.1 g/dL — ABNORMAL LOW (ref 12.0–15.0)
MCH: 25 pg — AB (ref 26.0–34.0)
MCHC: 31.2 g/dL (ref 30.0–36.0)
MCV: 80.2 fL (ref 78.0–100.0)
Platelets: 93 10*3/uL — ABNORMAL LOW (ref 150–400)
RBC: 3.24 MIL/uL — AB (ref 3.87–5.11)
RDW: 29.7 % — AB (ref 11.5–15.5)
WBC: 38.8 10*3/uL — AB (ref 4.0–10.5)

## 2018-07-20 LAB — PHOSPHORUS: Phosphorus: 2.4 mg/dL — ABNORMAL LOW (ref 2.5–4.6)

## 2018-07-20 LAB — FIBRINOGEN: FIBRINOGEN: 378 mg/dL (ref 210–475)

## 2018-07-20 LAB — SURGICAL PCR SCREEN
MRSA, PCR: NEGATIVE
STAPHYLOCOCCUS AUREUS: NEGATIVE

## 2018-07-20 LAB — PREALBUMIN: Prealbumin: 9 mg/dL — ABNORMAL LOW (ref 18–38)

## 2018-07-20 LAB — PROTIME-INR
INR: 1.41
Prothrombin Time: 17.2 seconds — ABNORMAL HIGH (ref 11.4–15.2)

## 2018-07-20 LAB — MAGNESIUM: MAGNESIUM: 1.6 mg/dL — AB (ref 1.7–2.4)

## 2018-07-20 SURGERY — LAPAROTOMY, EXPLORATORY
Anesthesia: General | Site: Abdomen

## 2018-07-20 MED ORDER — MAGNESIUM SULFATE 2 GM/50ML IV SOLN
2.0000 g | Freq: Once | INTRAVENOUS | Status: AC
  Administered 2018-07-20: 2 g via INTRAVENOUS
  Filled 2018-07-20: qty 50

## 2018-07-20 MED ORDER — MIDAZOLAM HCL 2 MG/2ML IJ SOLN
INTRAMUSCULAR | Status: DC | PRN
  Administered 2018-07-20: 4 mg via INTRAVENOUS

## 2018-07-20 MED ORDER — 0.9 % SODIUM CHLORIDE (POUR BTL) OPTIME
TOPICAL | Status: DC | PRN
  Administered 2018-07-20 (×3): 1000 mL

## 2018-07-20 MED ORDER — FREE WATER
100.0000 mL | Freq: Three times a day (TID) | Status: DC
  Administered 2018-07-20 – 2018-07-22 (×6): 100 mL

## 2018-07-20 MED ORDER — LIDOCAINE 2% (20 MG/ML) 5 ML SYRINGE
INTRAMUSCULAR | Status: AC
  Filled 2018-07-20: qty 5

## 2018-07-20 MED ORDER — VITAL AF 1.2 CAL PO LIQD
1500.0000 mL | ORAL | Status: DC
  Administered 2018-07-20: 1500 mL
  Filled 2018-07-20: qty 1500

## 2018-07-20 MED ORDER — ZINC TRACE METAL 1 MG/ML IV SOLN
INTRAVENOUS | Status: AC
  Administered 2018-07-20: 19:00:00 via INTRAVENOUS
  Filled 2018-07-20: qty 393

## 2018-07-20 MED ORDER — SODIUM CHLORIDE 0.9 % IV SOLN
INTRAVENOUS | Status: DC | PRN
  Administered 2018-07-20: 100 ug/min via INTRAVENOUS
  Administered 2018-07-20: 50 ug/min via INTRAVENOUS

## 2018-07-20 MED ORDER — POTASSIUM CHLORIDE 10 MEQ/100ML IV SOLN
10.0000 meq | INTRAVENOUS | Status: AC
  Administered 2018-07-20 (×4): 10 meq via INTRAVENOUS
  Filled 2018-07-20 (×4): qty 100

## 2018-07-20 MED ORDER — ROCURONIUM BROMIDE 10 MG/ML (PF) SYRINGE
PREFILLED_SYRINGE | INTRAVENOUS | Status: AC
  Filled 2018-07-20: qty 10

## 2018-07-20 MED ORDER — PHENYLEPHRINE 40 MCG/ML (10ML) SYRINGE FOR IV PUSH (FOR BLOOD PRESSURE SUPPORT)
PREFILLED_SYRINGE | INTRAVENOUS | Status: AC
  Filled 2018-07-20: qty 10

## 2018-07-20 MED ORDER — PANTOPRAZOLE SODIUM 40 MG PO PACK
40.0000 mg | PACK | Freq: Every day | ORAL | Status: DC
  Administered 2018-07-20 – 2018-07-23 (×4): 40 mg
  Filled 2018-07-20 (×4): qty 20

## 2018-07-20 MED ORDER — PROPOFOL 10 MG/ML IV BOLUS
INTRAVENOUS | Status: AC
  Filled 2018-07-20: qty 20

## 2018-07-20 MED ORDER — LACTATED RINGERS IV SOLN
INTRAVENOUS | Status: DC | PRN
  Administered 2018-07-20: 16:00:00 via INTRAVENOUS

## 2018-07-20 MED ORDER — CALCIUM CHLORIDE 10 % IV SOLN
INTRAVENOUS | Status: DC | PRN
  Administered 2018-07-20 (×5): 200 mg via INTRAVENOUS

## 2018-07-20 MED ORDER — IOHEXOL 300 MG/ML  SOLN
100.0000 mL | Freq: Once | INTRAMUSCULAR | Status: AC | PRN
  Administered 2018-07-20: 100 mL via INTRAVENOUS

## 2018-07-20 MED ORDER — FUROSEMIDE 10 MG/ML IJ SOLN
40.0000 mg | Freq: Once | INTRAMUSCULAR | Status: AC
  Administered 2018-07-20: 40 mg via INTRAVENOUS
  Filled 2018-07-20: qty 4

## 2018-07-20 MED ORDER — EPHEDRINE 5 MG/ML INJ
INTRAVENOUS | Status: AC
  Filled 2018-07-20: qty 10

## 2018-07-20 MED ORDER — PHENYLEPHRINE HCL-NACL 10-0.9 MG/250ML-% IV SOLN
0.0000 ug/min | INTRAVENOUS | Status: DC
  Administered 2018-07-20: 20 ug/min via INTRAVENOUS
  Administered 2018-07-20: 50 ug/min via INTRAVENOUS
  Filled 2018-07-20 (×4): qty 250

## 2018-07-20 MED ORDER — MIDAZOLAM HCL 2 MG/2ML IJ SOLN
INTRAMUSCULAR | Status: AC
  Filled 2018-07-20: qty 4

## 2018-07-20 MED ORDER — ALBUMIN HUMAN 5 % IV SOLN
INTRAVENOUS | Status: DC | PRN
  Administered 2018-07-20 (×3): via INTRAVENOUS

## 2018-07-20 MED ORDER — POTASSIUM PHOSPHATES 15 MMOLE/5ML IV SOLN
15.0000 mmol | Freq: Once | INTRAVENOUS | Status: AC
  Administered 2018-07-20: 15 mmol via INTRAVENOUS
  Filled 2018-07-20: qty 5

## 2018-07-20 MED ORDER — PHENYLEPHRINE HCL 10 MG/ML IJ SOLN
INTRAMUSCULAR | Status: DC | PRN
  Administered 2018-07-20 (×3): 200 ug via INTRAVENOUS

## 2018-07-20 MED ORDER — ROCURONIUM BROMIDE 100 MG/10ML IV SOLN
INTRAVENOUS | Status: DC | PRN
  Administered 2018-07-20: 100 mg via INTRAVENOUS

## 2018-07-20 SURGICAL SUPPLY — 45 items
BARRIER SKIN 2 3/4 (OSTOMY) ×2 IMPLANT
BARRIER SKIN 2 3/4 INCH (OSTOMY) ×1
BARRIER SKIN OD2.25 2 3/4 FLNG (OSTOMY) IMPLANT
BLADE CLIPPER SURG (BLADE) IMPLANT
BRR SKN FLT 2.75X2.25 2 PC (OSTOMY) ×1
CANISTER SUCT 3000ML PPV (MISCELLANEOUS) ×3 IMPLANT
CHLORAPREP W/TINT 26ML (MISCELLANEOUS) ×3 IMPLANT
COVER SURGICAL LIGHT HANDLE (MISCELLANEOUS) ×3 IMPLANT
DRAPE LAPAROSCOPIC ABDOMINAL (DRAPES) ×3 IMPLANT
DRAPE WARM FLUID 44X44 (DRAPE) ×3 IMPLANT
DRSG OPSITE POSTOP 4X10 (GAUZE/BANDAGES/DRESSINGS) IMPLANT
DRSG OPSITE POSTOP 4X8 (GAUZE/BANDAGES/DRESSINGS) IMPLANT
DRSG VAC ATS MED SENSATRAC (GAUZE/BANDAGES/DRESSINGS) ×2 IMPLANT
ELECT BLADE 6.5 EXT (BLADE) IMPLANT
ELECT CAUTERY BLADE 6.4 (BLADE) ×3 IMPLANT
ELECT REM PT RETURN 9FT ADLT (ELECTROSURGICAL) ×3
ELECTRODE REM PT RTRN 9FT ADLT (ELECTROSURGICAL) ×1 IMPLANT
GLOVE BIO SURGEON STRL SZ7.5 (GLOVE) ×3 IMPLANT
GLOVE BIOGEL PI IND STRL 8 (GLOVE) ×1 IMPLANT
GLOVE BIOGEL PI INDICATOR 8 (GLOVE) ×2
GOWN STRL REUS W/ TWL LRG LVL3 (GOWN DISPOSABLE) ×1 IMPLANT
GOWN STRL REUS W/ TWL XL LVL3 (GOWN DISPOSABLE) ×1 IMPLANT
GOWN STRL REUS W/TWL LRG LVL3 (GOWN DISPOSABLE) ×3
GOWN STRL REUS W/TWL XL LVL3 (GOWN DISPOSABLE) ×3
KIT BASIN OR (CUSTOM PROCEDURE TRAY) ×3 IMPLANT
KIT TURNOVER KIT B (KITS) ×3 IMPLANT
LIGASURE IMPACT 36 18CM CVD LR (INSTRUMENTS) IMPLANT
NS IRRIG 1000ML POUR BTL (IV SOLUTION) ×6 IMPLANT
PACK GENERAL/GYN (CUSTOM PROCEDURE TRAY) ×3 IMPLANT
PAD ARMBOARD 7.5X6 YLW CONV (MISCELLANEOUS) ×3 IMPLANT
SPECIMEN JAR LARGE (MISCELLANEOUS) IMPLANT
SPONGE LAP 18X18 X RAY DECT (DISPOSABLE) IMPLANT
STAPLER VISISTAT 35W (STAPLE) ×3 IMPLANT
SUCTION POOLE TIP (SUCTIONS) ×3 IMPLANT
SUT NOVA 1 T20/GS 25DT (SUTURE) ×4 IMPLANT
SUT PDS AB 1 TP1 96 (SUTURE) ×6 IMPLANT
SUT SILK 2 0 SH CR/8 (SUTURE) ×3 IMPLANT
SUT SILK 2 0 TIES 10X30 (SUTURE) ×3 IMPLANT
SUT SILK 3 0 SH CR/8 (SUTURE) ×3 IMPLANT
SUT SILK 3 0 TIES 10X30 (SUTURE) ×3 IMPLANT
TOWEL OR 17X24 6PK STRL BLUE (TOWEL DISPOSABLE) ×3 IMPLANT
TOWEL OR 17X26 10 PK STRL BLUE (TOWEL DISPOSABLE) ×3 IMPLANT
TRAY FOLEY MTR SLVR 16FR STAT (SET/KITS/TRAYS/PACK) ×3 IMPLANT
WND VAC CANISTER 500ML (MISCELLANEOUS) ×2 IMPLANT
YANKAUER SUCT BULB TIP NO VENT (SUCTIONS) IMPLANT

## 2018-07-20 NOTE — Op Note (Signed)
07/20/2018  5:23 PM  PATIENT:  Brandi [name]  62 y.o. female  PRE-OPERATIVE DIAGNOSIS:  Possible Ischemic bowel  POST-OPERATIVE DIAGNOSIS:   Ascites, negative for ischemic bowel  PROCEDURE:  Procedure(s): Reexploration of recent EXPLORATORY LAPAROTOMY (N/A)  SURGEON:  Surgeon(s) and Role:    Ralene Ok, MD - Primary   ANESTHESIA:   general  EBL:  50 mL   BLOOD ADMINISTERED:none  DRAINS: none   LOCAL MEDICATIONS USED:  NONE  SPECIMEN:  No Specimen  DISPOSITION OF SPECIMEN:  N/A  COUNTS:  YES  TOURNIQUET:  * No tourniquets in log *  DICTATION: .Dragon Dictation Indication procedure: Patient is a 62 year old female with history of cecal perforation, colon resection and end ileostomy.  Patient had increasing WBC count.  Patient underwent CT scan with signs consistent of ischemic bowel, portal venous gas, pneumatosis.  Secondary to this patient was taken back emergently to the operating for exploration.  Details of procedure: After the patient was consented patient was taken back to the operating placed in supine position with bilateral stent placed.  Patient was intubated on arrival.  Patient was then prepped and draped standard fashion.  A timeout was called and all facts verified.  The midline wound was reopened as the PDS sutures and Novafil sutures were removed.  There is large amount of ascites that was evacuated from the intra-abdominal cavity.  There was approximately 2600 cc of ascites.  At this time he was able to eviscerate the small bowel.  The small bowel was ran from the ligament of Treitz distally to the end ileostomy.  There is no ischemia seen.  The intra-abdominal cavity was easily explored as there was minimal adhesions.  The left, Hartman's portion of the left colon was visualized.  There was no ischemia to this portion of the colon.  The stomach was grasped and retracted caudad.  There is no ischemia to the stomach.  At this time the abdominal  cavity was irrigated with multiple liters of saline.  At this time the midline fascia was reapproximated using a running single-stranded, PDS suture in a standard running fashion.  Interrupted Novafil's were used to help reapproximate the fascia and reinforce the fascial closure.  The wound VAC was then placed in the midline wound and the skin was left open.  This was hooked up to suction without any leaks.  Patient tolerated procedure well was taken back to the ICU, intubated, and in critical condition.   PLAN OF CARE: Admit to inpatient   PATIENT DISPOSITION:  ICU - intubated and hemodynamically stable.   Delay start of Pharmacological VTE agent (>24hrs) due to surgical blood loss or risk of bleeding: no

## 2018-07-20 NOTE — Progress Notes (Signed)
PHARMACY - ADULT TOTAL PARENTERAL NUTRITION CONSULT NOTE   Pharmacy Consult: TPN Indication: Post-operative ileus  Patient Measurements: Height: '5\' 2"'  (157.5 cm) Weight: 201 lb 8 oz (91.4 kg) IBW/kg (Calculated) : 50.1 TPN AdjBW (KG): 52.8 Body mass index is 36.85 kg/m. 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 On 7/15, patient's clinical status worsened and increased confusion and multiorgan failure including respiratory failure requiring intubation. Patient improved and was extubated 7/25. However, patient had subsequent cardiac arrest on 7/28 and found to have perforation of descending colon and near perforation of cecum requiring ileocecectomy and partial colectomy with discontinuity and open abdomen. She returned to the OR on 7/30 with creation of end ileostomy and closure of abdomen. Patient has been NPO >7 days and with limited nutrition since admission (22 days); therefore, is at high risk for refeeding. Patient has Cortrak and NGT in place with minimal output from NGT. Surgery holding on restarting TF until output from ostomy.   GI: prealbumin improved to 8.4, LBM 8/5, NG O/P up to 955m, drain O/P 4561m ileostomy O/P dow to 10061m PPI PT Endo: no hx DM - CBGs improving. Off steroids since 8/1 Insulin requirements in the past 24 hours: 31 units + 15 units in TPN Lytes: Na/CL 150/113 (free water 100m33mh), K 2.9 (4 runs), Mag 1.6 (2gm), Phos 2.4 Renal: SCr 0.72 stable, BUN 47 - UOP 0.7 ml/kg/hr, LR at 20mL76m Lasix 40 IV x1 today Pulm: FiO2 40%  Cards: EF 30-35%, CVP 8, Afib with HR 80-90s on IV metoprolol, diltiazem gtt. Hepatobil: AST/ALT 74/60, alk phos down 505, tbili down 8.4. TG 197 Neuro: HE - lactulose/rifaximin.  Sedated on Fentanyl. Sz history on Vimpat + Keppra.  GCS 8, CPOT 0-3, RASS 1 ID: Zosyn D#[id number] for IAI - Tmax 100.6, WBC 388, LA 10.6 TPN  Access: PICC placed 07/10/18 TPN start date: 07/16/18  Nutritional Goals (per RD recommendation on 8/2): 1589 kCal, 100-125gm protein and >/= 1.5L fluid per day  Current Nutrition:  TPN (goal rate 70 ml/hr) Vital AF 1.2 at 35 ml/hr (provides 1008 kCal, 63g protein)   Plan:  Reduce TPN to 25 ml/hr, providing 39g AA and 108g CHO for a total of 524 kCal Hold 20% ILE for first 7 days for ICU patients per ASPEN guidelines (start date 8/8 if still on TPN). TPN + TF provide 102g AA and 1532 kCal, meeting > 95% of patient's needs Electrolytes in TPN: remove Na, increase K/Phos/Mag, max acetate Daily multivitamin in TPN Remove trace elements d/t elevated tbili - add back zinc 5mg, 108momium 10 mcg, and selenium 60 mcg Pepcid 20mg i42mily TPN Continue resistant SSI Q4H + increase regular insulin in TPN to 25 units KPhos 15 mmol IV x 1 F/U AM labs, TF advancement to D/C TPN   Consider stopping TPN tomorrow as it is not cost effective and TF is meeting 70% of patient's needs.   Saksham Akkerman D. Yousef Huge, PMina MarbleD, BCPS, BCCCP [id number], 10:55 AM

## 2018-07-20 NOTE — Progress Notes (Signed)
Patient ID: Brandi [name], female   DOB: 12/13/1956, 62 y.o.   MRN: [id number] Pt with increasing WBC.  Pt underwent CT scan which shows PV gas and pneumotosis. Pt on sedation, but withdrawls to pain on abd palp. BP stable and not on pressors currently.   Pt with stable BP throughout the day.  D/w findings with her husband Alvester Chou via phone.  D/w him the CT findings and the need for emergent OR exploration to eval for ischemic bowel.  He agree and wishes for [place] to proceed to the OR.  I d/w him the risks and benefits of the procedure to include, but not limited to: infection, bleeding, damage to surrounding structures, possible for further surgery.  He voiced understanding and wishes to proceed.

## 2018-07-20 NOTE — Anesthesia Procedure Notes (Signed)
Date/Time: 07/20/2018 4:15 PM Performed by: Lance Coon, CRNA Pre-anesthesia Checklist: Patient identified, Emergency Drugs available, Suction available, Patient being monitored and Timeout performed Patient Re-evaluated:Patient Re-evaluated prior to induction Oxygen Delivery Method: Circle system utilized Preoxygenation: Pre-oxygenation with 100% oxygen Induction Type: Inhalational induction Placement Confirmation: positive ETCO2 and breath sounds checked- equal and bilateral Comments: Indwelling ett maintained

## 2018-07-20 NOTE — Consult Note (Addendum)
[facility] Nurse wound consult note Reason for Consult: Midline abdominal wound dressing vac change. Surgical PA at the bedside to assess wound appearance. Full thickness post-op wound Red woundbed with yellow adipose interspersed throughout.   Mod amt yellow drainage in the cannister, no odor Pt is on continuous IV for pain and tolerated with minimal amt discomfort. Applied 1 piece black foam to 181mm cont suction.   There are red partial thickness wounds from previous medical adhesive related skin injuries surrounding the wounds, did not cover with drape and left open to air.  [facility] Nurse ostomy consultnote Stoma type/location:RLQ ileostomy, flush Stomal assessment/size:1 1/4 inches, flush with skin level, dark red. Small amt liquid brown stool in pouch.There are red partial thickness wounds from previous medical adhesive related skin injuries surrounding the wounds, did not cover and left open to air. Ostomy pouching: Applied one piece convex pouch with barrier ring to attempt to maintain a seal. Education provided:Pt is sedated and intubated and there are no family members at the bedside. Enrolled patient in [place] program: No WOC team will plan to change Vac and ostomy pouch Q M/W/F and begin teaching sessions when stable and out of ICU. Julien Girt MSN, RN, [place], [place], [place]

## 2018-07-20 NOTE — Anesthesia Preprocedure Evaluation (Signed)
Anesthesia Evaluation  Patient identified by MRN, date of birth, ID bandPreop documentation limited or incomplete due to emergent nature of procedure.  Airway Mallampati: Intubated       Dental   Pulmonary  History noted. CG    + decreased breath sounds      Cardiovascular hypertension, + Peripheral Vascular Disease   Rhythm:Regular Rate:Normal     Neuro/Psych  Headaches,    GI/Hepatic GERD  ,  Endo/Other  diabetes  Renal/GU      Musculoskeletal   Abdominal   Peds  Hematology   Anesthesia Other Findings   Reproductive/Obstetrics                             Anesthesia Physical Anesthesia Plan  ASA: IV  Anesthesia Plan: General   Post-op Pain Management:    Induction: Intravenous  PONV Risk Score and Plan: Treatment may vary due to age or medical condition  Airway Management Planned: Oral ETT  Additional Equipment:   Intra-op Plan:   Post-operative Plan: Post-operative intubation/ventilation  Informed Consent:   Plan Discussed with: CRNA, Anesthesiologist and Surgeon  Anesthesia Plan Comments:         Anesthesia Quick Evaluation

## 2018-07-20 NOTE — Anesthesia Postprocedure Evaluation (Signed)
Anesthesia Post Note  Patient: Brandi [name]  Procedure(s) Performed: EXPLORATORY LAPAROTOMY (N/A Abdomen)     Patient location during evaluation: ICU Anesthesia Type: General Level of consciousness: patient remains intubated per anesthesia plan Pain management: pain level controlled Vital Signs Assessment: post-procedure vital signs reviewed and stable Respiratory status: patient remains intubated per anesthesia plan Cardiovascular status: stable Postop Assessment: no apparent nausea or vomiting Anesthetic complications: no    Last Vitals:  Vitals:   07/20/18 1555 07/20/18 1600  BP:  133/66  Pulse: 62 79  Resp: (!) 25 (!) 22  Temp:    SpO2: 100% 100%    Last Pain:  Vitals:   07/20/18 1100  TempSrc: Oral  PainSc:                  Jerame Hedding

## 2018-07-20 NOTE — Progress Notes (Signed)
Nutrition Follow-up  DOCUMENTATION CODES:   Obesity unspecified  INTERVENTION:   Recommend increasing TPN to goal rate to meet 90-100% of needs Pt with inadequate nutrition since admission (27 days) and need to maximize nutrition. Recommend not decreasing TPN until pt tolerating TF at goal rate  as  multiple attempts to titrate to goal rate of TF previously have failed.   Resume trickle TF of Vital AF 1.2 as medically able  NUTRITION DIAGNOSIS:   Inadequate oral intake related to acute illness as evidenced by NPO status.  Being addressed via TPN  GOAL:   Patient will meet greater than or equal to 90% of their needs  Not met  MONITOR:   Vent status, TF tolerance, Labs, Weight trends  REASON FOR ASSESSMENT:   Consult, Ventilator Enteral/tube feeding initiation and management  ASSESSMENT:   62 yo female admitted 7/9 with weakness, anorexia and lethargy with NASH cirrhosis with hepatic encephalopathy, new onset Afib with RVR, new onset HFrEF 30-35%, anemia. On 7/15,  pt's condition worsened with increased confusion/lethargy, tardive dyskinesia symptoms, AKI, shock liver and respiratory failure requiring intubation.  Pt with hx of HTN, HLD, seizure disorder, left breast cancer s/p lumpectomy 2008, GERD, depression, DM  7/15 Intubated, Inadequate nutrition x 7 days 7/17 Trickle TF started but did not tolerate, vomiting 7/18 CT abdomen, colonic ileus with stool in ascending colon 7/21 Trickle TF re-started 7/22 Post-pyloric Cortrak tube placed 7/25-Extubated, TF at goal 7/28-Cardiac arrest, intubated,perforated descending colon and near perforation of cecum. Taken forIleocecectomy and partialcolectomy leftin discontinuity,open abdomen with wound vac 7/30 Return to OR, partial colectomy with closure of abdomen and creation of end ileostomy 8/1 TPN started 8/2 Trickle TF started 8/5 CT abdomen:  PV gas and pneumatosis (air within draining veins of the small bowel, the  superior mesenteric vein, and intrahepatic branches of the portal vein); pt taken emergently to OR for exploration to eval for ischemic bowel  Patient is currently intubated on ventilator support MV: 11.4 L/min Temp (24hrs), Avg:99.4 F (37.4 C), Min:98.2 F (36.8 C), Max:100.2 F (37.9 C)  Minimal ileostomy output per RN  TF started at 20 ml/hr on 8/2, increased to 30 ml/hr over the weekend and further increased to 35 ml/hr this AM TF off since CT today, no plans to resume post surgery at present TPN decreased to 25 ml/hr today  Cortrak tip at LOT  EDW 72 kg; nutritional needs re-estimated today  Labs: sodium 150, potassium 2.9 (L), phosphorus 2.4, magnesium 1.6, Creatinine wdl, TG 197 Meds: reviewed   Diet Order:   Diet Order           Diet NPO time specified  Diet effective now          EDUCATION NEEDS:   Education needs have been addressed  Skin:  Skin Assessment: Skin Integrity Issues: Skin Integrity Issues:: Wound VAC Wound Vac: abdomen Other: MASD: buttocks, groin, perineum  Last BM:  8/5 100 mL out in previous 24 hours via ileosotmy  Height:   Ht Readings from Last 1 Encounters:  07/04/18 '5\' 2"'$  (1.575 m)    Weight:   Wt Readings from Last 1 Encounters:  07/20/18 201 lb 8 oz (91.4 kg)    Ideal Body Weight:  50 kg  BMI:  Body mass index is 36.85 kg/m.  Estimated Nutritional Needs:   Kcal:  1658 kcals   Protein:  110-130 g  Fluid:  >/= 1.7 L   Kerman Passey MS, RD, LDN, CNSC [phone] Pager  [phone] Weekend/On-Call Pager

## 2018-07-20 NOTE — Progress Notes (Signed)
CSW continuing to follow and assist with disposition when stable  Jorge Ny, [place] Social Worker [phone]

## 2018-07-20 NOTE — Progress Notes (Addendum)
PULMONARY / CRITICAL CARE MEDICINE   Name: Brandi [name] MRN: [id number] DOB: [birth year]    ADMISSION DATE:  06/23/2018 CONSULTATION DATE: 06/29/2018  REFERRING MD: Dr. Bonner Puna  CHIEF COMPLAINT: Altered mental status  HISTORY OF PRESENT ILLNESS:  62 y/o F who presented to [facility] on 7/9 with abdominal pain, nausea, vomiting, decreased oral intake and weakness.    She was reportedly evaluated by her PCP prior to admit with elevated LFT's (AST 302, ALT 611, AP 380).  Follow up CT of the abdomen demonstrated prominent caudate lobe and surface irregularity of the liver.   In the ER, US of the abdomen confirmed the CT findings of the liver.  She was noted to be lethargic and have asterixis on exam.  Ammonia 22 (7/10) > 38 (7/12).  The patient had AFwRVR and was treated with cardizem.  She was admitted for further evaluation.  The patient was seen by Cardiology and transitioned to metoprolol for rate control due to decreased LVEF (30-35%). Labs showed a microcytic anemia with ferritin of 9.  GI was consulted for evaluation with recommendations for lactulose and eventual endoscopic evaluation.  She had ongoing encephalopathy and a CT of the head was obtained which showed a right frontal operculum CVA.  Neurology evaluated the patient and felt the stroke was likely related to AF but not the cause of her mental status.  She was recommended anticoagulation and passed a swallow evaluation converting from heparin to eliquis.  On 7/15, her confusion, tardive dyskinesia symptoms and lethargy worsened as well as her LFT's (AST 3942, ALT 1976, ALK Phos 404), renal function and WBC.  PCCM consulted for evaluation.  Anticoagulation stopped due to shock liver.  PSY meds were held due to severe tardive dyskinesia and liver dysfunction.  She had prolonged intubation due to poor mental status.  The patient was extubated on 7/25 without difficulty.  She was transitioned to [facility] on 7/26.  She developed agitated delirium overnight  7/26 and precedex was initiated by ELink.  PCCM called back 7/28 to assess patient as she had coffee ground emesis overnight, concern for aspiration, hypotension that responded to 527m saline bolus x2.  As day progressed on 7/28, she had progressive abdominal pain.  CT of the abd/pelvis demonstrated bowel perforation.  She was intubated, had brief arrest post intubation (9 min) and was taken to the OR.  Returned to ICU on epi, neo, vaso, levo, bicarb.  Pressor need improved overnight into 7/29.    SUBJECTIVE:  A. fib with RVR  remains on Cardizem drip HR up and down  Low-grade fevers, white blood cell count trending up 38,000  On tube feeds at 30cc/hr  weaning TPN to off  NGT with bilious output (950 cc /24hr) Weaning today 5/5 , HR tr up .   VITAL SIGNS: BP (!) 144/66 (BP Location: Left Leg)   Pulse 60   Temp 98.2 F (36.8 C) (Oral)   Resp (!) 26   Ht '5\' 2"'  (1.575 m)   Wt 201 lb 8 oz (91.4 kg)   SpO2 100%   BMI 36.85 kg/m   HEMODYNAMICS: CVP:  [6 mmHg-13 mmHg] 8 mmHg  VENTILATOR SETTINGS: Vent Mode: PSV;CPAP FiO2 (%):  [40 %] 40 % Set Rate:  [18 bmp] 18 bmp Vt Set:  [460 mL] 460 mL PEEP:  [5 cmH20] 5 cmH20 Pressure Support:  [5 cmH20-8 cmH20] 5 cmH20 Plateau Pressure:  [14 cmH20-16 cmH20] 14 cmH20  INTAKE / OUTPUT:  Intake/Output Summary (Last 24 hours)  at 07/20/2018 0850 Last data filed at 07/20/2018 0800 Gross per 24 hour  Intake 2664.55 ml  Output 3075 ml  Net -410.45 ml    PHYSICAL EXAMINATION: General: Intermittent agitation, response to pain.  Does not follow commands   HEENT: AT [place], AT, sclera icteric  Neuro: Intermittent agitation, jerking movement/tardive dyskinesia  CV: Irregular, no murmur rub or gallop  PULM: Diminished breath sounds in the bases GI: Wound VAC mid abdomen with serous to orange drainage, ileostomy was liquid stool , NG tube with bilious output  Extremities: Warm, dry with general edema  Skin: No rash, wound VAC to mid abdomen       LABS:  BMET Recent Labs  Lab 07/19/18 0524 07/19/18 1138 07/20/18 0341  NA 147* 149* 150*  K 5.4* 3.4* 2.9*  CL 109 110 113*  CO2 32 31 30  BUN 48* 46* 47*  CREATININE 0.74 0.80 0.72  GLUCOSE 177* 207* 179*    Electrolytes Recent Labs  Lab 07/18/18 0930 07/19/18 0524 07/19/18 1138 07/20/18 0341  CALCIUM 7.7* 7.5* 7.7* 7.8*  MG 1.5* 1.9  --  1.6*  PHOS 3.5 3.0  --  2.4*    CBC Recent Labs  Lab 07/18/18 0341 07/19/18 0336 07/20/18 0341  WBC 24.7* 30.3* 38.8*  HGB 9.5* 8.4* 8.1*  HCT 31.2* 27.0* 26.0*  PLT 81* 70* 93*    Coag's Recent Labs  Lab 07/18/18 0341 07/19/18 0336 07/20/18 0341  INR 1.44 1.57 1.41    Sepsis Markers Recent Labs  Lab 07/13/18 0949 07/13/18 1542 07/14/18 0658 07/19/18 1138  LATICACIDVEN 6.0* 4.9*  --  1.6  PROCALCITON  --   --  34.83  --     ABG Recent Labs  Lab 07/13/18 1010 07/14/18 0335  PHART 7.471* 7.526*  PCO2ART 30.7* 34.0  PO2ART 101 78.6*    Liver Enzymes Recent Labs  Lab 07/16/18 0910 07/18/18 0930 07/20/18 0341  AST 89* 72* 74*  ALT 51* 50* 60*  ALKPHOS 352* 710* 505*  BILITOT 9.5* 10.1* 8.4*  ALBUMIN 2.2* 1.6* 1.6*    Cardiac Enzymes No results for input(s): TROPONINI, PROBNP in the last 168 hours.  Glucose Recent Labs  Lab 07/19/18 1151 07/19/18 1535 07/19/18 1952 07/19/18 2328 07/20/18 0330 07/20/18 0718  GLUCAP 183* 217* 198* 183* 158* 177*    Imaging  STUDIES:  [place] ABD 7/6 >> possible cirrhosis, gallbladder surgically absent CT Head 7/9 >> atrophy with small vessel chronic ischemic changes of deep cerebral white matter CT Renal Study 7/9 >> no acute findings, bilateral non-obstructing renal stones ECHO 7/9 >> LV mildly dilated, wall thickness was increased in a pattern of moderate LVH, LVEF 30-35%, severe hypokinesis of the anteroseptal, anterior, inferoseptal & apical myocardium, mild to moderate MV regurgitation, LA severely dilated, RV moderately dilated, RA  mod-severely dilated, PA pressure 30mHg, trivial pericardial effusion. CTA Head, Neck 7/12 >> minimal calcified plaque at the carotid bifurcation regions but no stenosis or irregularity, no large vessel occlusions, unable to specifically identify the missing branch vessel responsible for the right posterior frontal infarction but this is presumably an occluded M3 or distal branch CT Head w/o 7/12 >> the small right middle cerebral artery distribution, posterolateral frontal lobe infarct has evolved since the prior CT, it is now seen as a well defined area of hypoattenuation, no new areas of infarct, no ICH CT head 7/15 >> Redemonstration of right posterior frontal lobe infarct. No acute intracranial hemorrhage or new infarct identified.  Atrophy with chronic small  vessel ischemia. CT abd/pelvis 7/18 > Diffuse dilation of the colon with large amount of ascending colonic stool.  CT ABD/Pelvis 7/28 >> free air in the abd consistent with bowel rupture, unable to identify source, bilateral pleural effusions (stable), non-obstructive kidney stones EEG 8/2 >no seizure act   CULTURES: BCx2 7/10 >> negative  Cdiff 7/20 >> negative  Sputum 7/20 >> negative Sputum 7/29>>Kleb Pne SS zoysn BC 7/28 >neg  BC 8/4 >>  ANTIBIOTICS: Vanco 7/15 >> 7/21 Zoysn 7/15 >> 7/22 Unasyn 7/28 >> 7/28 Diflucan 7/28 >> 7/31 Zosyn 7/28 >>  Vanco 7/28 >>7/31  SIGNIFICANT EVENTS: 7/09  Admit  7/15  PCCM consulted with AMS, acute liver failure 7/19  Had been receiving fluid challenges for hypotension. 7/20   Early in the a.m. hours drop blood pressure.  Placed on pressors.  Non-anion gap acidosis noted.  Cortisol 29.2 CBC remains elevated blood cell count remains elevated.  7/21  no significant fever.  White cell count cut in half.  Chest x-ray looks improved.  We resumed lactulose and rifaximin yesterday, also resumed spironolactone.  Still obtunded  7/22  Free water increased 7/23  800 ml in flexiseal overnight.  Off  home PSY meds. Mg/K low.  Na 153 on free water. Continues to have tardive dyskinesia.  7/24 Tmax 100.2.  I/O- positive 8.6L for admit / 2125 of stool out 7/23. 7/26  extubated on 7/25. Remains encephalopathic. NA still elevated  7/28  PCCM called back for hypotension on precedex, coffee ground emesis. Concern for aspiration, hypotension that responded to 525m saline bolus x2.  TF held overnight with nausea/abd pain.  NGT placed due to vomiting. ABD pain increased > CT abd with bowel perforation > to OR for resection/wash out Wound vac, /ileostomy    LINES/TUBES: ETT 7/15 >> 7/25 ETT 7/28 >>  L Rad Aline 7/28 >> out R IJ dual lumen 7/28 >>  DISCUSSION: 62y/o F admitted with weakness, decreased intake and lethargy.  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  She developed AMS > small infarct thought not related to AMS.  Recommended for anticoagulation by Cardiology/Neurology, was transitioned from heparin to eliquis.  The patient deteriorated on 7/15 with a rise in LFT's, AKI, confusion/AMS, worsening of tardive dyskinesia type symptoms.  Transferred to ICU.    ICU course significant for slow improvement of mental status (she actually looks the best I have seen her on 7/28).  She remains on room air with no distress.  She has had periods of agitation which I suspect are related to being off her maintenance PSY regimen (due to liver dysfunction). She has been seen by PSY (see recommendations).  Her electrolytes have difficult to manage in the setting of high stool output & poor nutritional status. She has been on free water and dextrose via IV. She pulled her flexiseal out multiple times overnight 7/28.  ABD pain worsened 7/28 during the day > CT ABD with perforation, to OR with Dr. BBarry Dienes  07/15/2018 to wean her altered mental status is a block to any attempt at extubation. 07/16/2018 TPN to start Patient to less agitated on 07/17/2008 8/4 TF restarted, TPN to wean off    ASSESSMENT /  PLAN:  Active issues   Acute Hypoxic Respiratory Failure - in setting of bowel perforation, shock Bilateral Pleural Effusions - suspect in setting of liver disease, HF, AKI Klebsiella pneumoniae in sputum P: Wean per protocol Mental status is a block to extubation May need tracheostomy in the  future   Acute metabolic & hepatic encephalopathy / acute delirium - improved CVA - Right Frontal Operculum infarct - felt related to AF/embolic but not all cause of mental status Right-sided weakness Seizure disorder-EEG 8/2 no seizure act  Schizophrenia Tardive Dyskinesia - improved 7/28  P: Cont Keppra/Vimpat   HFrEF - LVEF 30% AF with RVR>recurrent 8/4 >cardizem restarted -rate improved  Hypotension - 7/28, suspect related to intravascular volume depletion, sedation>resolved off pressors  Hx HTN   P: Off anticoagulation Cont metoprolol  Lasix 51m IV x 1 today  Cardizem restarted 8/4    AGMA - in setting of bowel perforation, shock -resolved  Fluid and Electrolyte imbalance: Hypernatremia / Hyperchloremia, Hypokalemia  Hypernatremia  Hypomagnesium  Recent Labs  Lab 07/19/18 0524 07/19/18 1138 07/20/18 0341  K 5.4* 3.4* 2.9*    P: Replete electrolytes as needed Wean TPN as able -would try to stop today  Replace K+  Add free water  Replace Mg+   NKarlene LinemanCirrhosis with Hepatic Encephalopathy  - unclear in what precipitated change in events LFTs/ decompensation 7/15 - 7/15 UKorealiver w/doppler with no evidence of portal venous thrombosis  -LFTs improved 8/4 Am[place]ia 20 P: Intermittently follow-up LFTs Cont lactulose 20g tid   Perforated Viscus  At Risk Abscess Development  - mid descending colon perforation s/p partial colectomy , ileostomy  P:  Per general surgery Wound vac  CT abd pending   Leukocytosis, questionable sepsis however etiology unclear.   Klebsiella pneumoniae in sputum - Antibiotics + antifungal restarted 7/28 with abd perforation  8/5>wbc tr up  , low gr fevers  P: Continue antimicrobial therapy Cx pending  CT abd/pelvis pending   Coagulopathy Thrombocytopenia plts 32->44>73>81>70>93 Anemia Recent Labs    07/19/18 0336 07/20/18 0341  HGB 8.4* 8.1*  plts 32 Lab Results  Component Value Date   INR 1.41 07/20/2018   INR 1.57 07/19/2018   INR 1.44 07/18/2018     P: Monitor for hemorrhage Tr cbc  SCD   Coffee Ground Emesis - new 7/28 Vomiting-resolved  P: Continue protonix  NG to IWS   DM 2 CBG (last 3)  Recent Labs    07/19/18 2328 07/20/18 0330 07/20/18 0718  GLUCAP 183* 158* 177*    P:  Continue sliding-scale insulin spike with initiation of TPN  Hx Breast Cancer P: Follow-up as outpatient  DVT prophylaxis: SCD's SUP: PPI  Diet: NPO, TPN to started 07/16/2018, tube feeds started 07/17/2017 Activity: BR Disposition : ICU, critically ill   FAMILY  - Updates: 8/42019 family updated at bedside , none at bedside 8/5 .     Tammy Parrett NP-C  [facility]  [phone]  07/20/2018   Attending Note:  62year old with liver failure presenting to PCCM with respiratory failure due to inability to protect her airway.  Patient remains poorly responsive and is not weaning.  A-fib with RVR noted this AM.  Coarse BS diffusely on exam.  I reviewed CXR myself, ETT is in good position.  Discussed with PCCM-NP.  Will hold off further weaning at this point.  RN to reach out to the husband to see if trach/peg is what the family would wish for the patient as I do not see successful extubation given current condition.  Hold TF given GI condition.  The patient is critically ill with multiple organ systems failure and requires high complexity decision making for assessment and support, frequent evaluation and titration of therapies, application of advanced monitoring  technologies and extensive interpretation of multiple databases.   Critical Care Time devoted to patient care services described in  this note is  33  Minutes. This time reflects time of care of this signee Dr [name]. This critical care time does not reflect procedure time, or teaching time or supervisory time of PA/NP/Med student/Med Resident etc but could involve care discussion time.  Rush Farmer, M.D. [facility] Pulmonary/Critical Care Medicine. Pager: [phone]. After hours pager: [phone]

## 2018-07-20 NOTE — Progress Notes (Signed)
[facility] Progress Note  6 Days Post-Op  Subjective: CC: intubated/sedated Patient responds to pain On cardizem gtt for A.Fib with RVR Tolerating TF at 30 cc/h  Objective: Vital signs in last 24 hours: Temp:  [98.2 F (36.8 C)-100.6 F (38.1 C)] 98.2 F (36.8 C) (08/05 0700) Pulse Rate:  [64-115] 97 (08/05 0723) Resp:  [15-27] 19 (08/05 0723) BP: (97-171)/(45-95) 117/65 (08/05 0600) SpO2:  [98 %-100 %] 98 % (08/05 0723) FiO2 (%):  [40 %] 40 % (08/05 0723) Weight:  [91.4 kg (201 lb 8 oz)] 91.4 kg (201 lb 8 oz) (08/05 0217) Last BM Date: 07/19/18(ileostomy)  Intake/Output from previous day: 08/04 0701 - 08/05 0700 In: 2652.1 [I.V.:1331.7; NG/GT:900; IV Piggyback:420.4] Out: 3075 [Urine:1575; Emesis/NG output:950; Drains:450; Stool:100] Intake/Output this shift: No intake/output data recorded.  PE: Gen:  Intubated, minimally responsive Card:  Atrial fibrillation with rate in the 130s Pulm:  On the ventilator Abd: Soft, mildly distended, bowel sounds hypoactive, midline wound as below, some skin breakdown to left of dressing, stoma pink with soft brown stool in ostomy     Lab Results:  Recent Labs    07/19/18 0336 07/20/18 0341  WBC 30.3* 38.8*  HGB 8.4* 8.1*  HCT 27.0* 26.0*  PLT 70* 93*   BMET Recent Labs    07/19/18 1138 07/20/18 0341  NA 149* 150*  K 3.4* 2.9*  CL 110 113*  CO2 31 30  GLUCOSE 207* 179*  BUN 46* 47*  CREATININE 0.80 0.72  CALCIUM 7.7* 7.8*   PT/INR Recent Labs    07/19/18 0336 07/20/18 0341  LABPROT 18.6* 17.2*  INR 1.57 1.41   CMP     Component Value Date/Time   NA 150 (H) 07/20/2018 0341   NA 138 06/22/2018 1350   NA 144 10/24/2014 1336   K 2.9 (L) 07/20/2018 0341   K 3.5 10/24/2014 1336   CL 113 (H) 07/20/2018 0341   CL 108 (H) 06/24/2013 0610   CL 103 10/22/2012 1343   CO2 30 07/20/2018 0341   CO2 31 (H) 10/24/2014 1336   GLUCOSE 179 (H) 07/20/2018 0341   GLUCOSE 116 10/24/2014 1336   GLUCOSE 153  (H) 10/22/2012 1343   BUN 47 (H) 07/20/2018 0341   BUN 19 06/22/2018 1350   BUN 14.9 10/24/2014 1336   CREATININE 0.72 07/20/2018 0341   CREATININE 0.77 10/22/2017 1007   CREATININE 0.8 10/24/2014 1336   CALCIUM 7.8 (L) 07/20/2018 0341   CALCIUM 10.7 (H) 10/24/2014 1336   PROT 3.8 (L) 07/20/2018 0341   PROT 5.9 (L) 06/22/2018 1350   PROT 7.6 10/24/2014 1336   ALBUMIN 1.6 (L) 07/20/2018 0341   ALBUMIN 3.8 06/22/2018 1350   ALBUMIN 4.1 10/24/2014 1336   AST 74 (H) 07/20/2018 0341   AST 21 10/24/2014 1336   ALT 60 (H) 07/20/2018 0341   ALT 31 10/24/2014 1336   ALKPHOS 505 (H) 07/20/2018 0341   ALKPHOS 138 10/24/2014 1336   BILITOT 8.4 (H) 07/20/2018 0341   BILITOT 1.1 06/22/2018 1350   BILITOT 0.44 10/24/2014 1336   GFRNONAA >60 07/20/2018 0341   GFRNONAA >60 06/24/2013 0610   GFRAA >60 07/20/2018 0341   GFRAA >60 06/24/2013 0610   Lipase     Component Value Date/Time   LIPASE 37 06/23/2018 0119       Studies/Results: Dg Chest 1 View  Result Date: 07/18/2018 CLINICAL DATA:  Endotracheal tube placement.  Pulled out 3 cm. EXAM: CHEST  1 VIEW COMPARISON:  Chest x-ray  from earlier same day. FINDINGS: Endotracheal tube has been retracted and now is well positioned with tip just above the level of the carina. No other change compared to the chest x-ray from earlier same day. IMPRESSION: Endotracheal tube has been pulled back and is now well positioned with tip just above the level of the carina. Electronically Signed   By: Franki Cabot M.D.   On: 07/18/2018 12:08   Dg Chest Port 1 View  Result Date: 07/20/2018 CLINICAL DATA:  Hypoxia EXAM: PORTABLE CHEST 1 VIEW COMPARISON:  July 18, 2018 FINDINGS: Endotracheal tube tip is 6 mm above the carina. Central catheter tips are in the superior vena cava. Feeding tube and nasogastric tube tips are below the diaphragm. No pneumothorax. There are pleural effusions bilaterally with bibasilar atelectasis. Heart is upper normal in size with  pulmonary vascularity normal. No adenopathy. There are surgical clips in left axilla. IMPRESSION: Tube and catheter positions as described without pneumothorax. Note that the endotracheal tube tip is close to the carina. Advise withdrawing endotracheal tube 2-3 cm. Bilateral pleural effusions with bibasilar atelectasis. No frank consolidation. Stable cardiac silhouette. Electronically Signed   By: Lowella Grip III M.D.   On: 07/20/2018 07:14   [place] Ekg Site Rite  Result Date: 07/19/2018 If Site Rite image not attached, placement could not be confirmed due to current cardiac rhythm.   Anti-infectives: Anti-infectives (From admission, onward)   Start     Dose/Rate Route Frequency Ordered Stop   07/17/18 1600  rifaximin (XIFAXAN) tablet 550 mg     550 mg Oral 3 times daily 07/17/18 1254     07/14/18 0600  ceFAZolin (ANCEF) IVPB 2g/100 mL premix     2 g 200 mL/hr over 30 Minutes Intravenous On call to O.R. 07/13/18 2234 07/14/18 1052   07/13/18 2100  fluconazole (DIFLUCAN) IVPB 200 mg  Status:  Discontinued     200 mg 100 mL/hr over 60 Minutes Intravenous Every 24 hours 07/12/18 1958 07/15/18 1224   07/13/18 1800  vancomycin (VANCOCIN) IVPB 1000 mg/200 mL premix  Status:  Discontinued     1,000 mg 200 mL/hr over 60 Minutes Intravenous Every 24 hours 07/12/18 1549 07/15/18 1224   07/13/18 0100  piperacillin-tazobactam (ZOSYN) IVPB 3.375 g     3.375 g 12.5 mL/hr over 240 Minutes Intravenous Every 8 hours 07/12/18 1549     07/12/18 2100  fluconazole (DIFLUCAN) IVPB 400 mg     400 mg 100 mL/hr over 120 Minutes Intravenous  Once 07/12/18 1958 07/12/18 2306   07/12/18 1600  vancomycin (VANCOCIN) 1,500 mg in sodium chloride 0.9 % 500 mL IVPB     1,500 mg 250 mL/hr over 120 Minutes Intravenous STAT 07/12/18 1549 07/12/18 1829   07/12/18 1600  piperacillin-tazobactam (ZOSYN) IVPB 3.375 g     3.375 g 100 mL/hr over 30 Minutes Intravenous STAT 07/12/18 1549 07/12/18 1701   07/12/18 0800   ampicillin-sulbactam (UNASYN) 1.5 g in sodium chloride 0.9 % 100 mL IVPB  Status:  Discontinued     1.5 g 200 mL/hr over 30 Minutes Intravenous Every 6 hours 07/12/18 0715 07/12/18 1538   07/04/18 1000  rifaximin (XIFAXAN) tablet 550 mg  Status:  Discontinued     550 mg Per Tube 3 times daily 07/04/18 0925 07/12/18 1647   06/30/18 2200  vancomycin (VANCOCIN) IVPB 1000 mg/200 mL premix  Status:  Discontinued     1,000 mg 200 mL/hr over 60 Minutes Intravenous Every 24 hours 06/30/18 2008 07/04/18 7654  06/30/18 2200  piperacillin-tazobactam (ZOSYN) IVPB 3.375 g  Status:  Discontinued     3.375 g 12.5 mL/hr over 240 Minutes Intravenous Every 8 hours 06/30/18 2011 07/06/18 1149   06/30/18 0000  vancomycin variable dose per unstable renal function (pharmacist dosing)  Status:  Discontinued      Does not apply See admin instructions 06/29/18 1229 07/03/18 0747   06/29/18 1300  piperacillin-tazobactam (ZOSYN) IVPB 2.25 g  Status:  Discontinued     2.25 g 100 mL/hr over 30 Minutes Intravenous Every 8 hours 06/29/18 1229 06/30/18 2011   06/29/18 1300  vancomycin (VANCOCIN) 1,250 mg in sodium chloride 0.9 % 250 mL IVPB     1,250 mg 166.7 mL/hr over 90 Minutes Intravenous  Once 06/29/18 1229 06/29/18 2234       Assessment/Plan Nash cirrhosis with hepatic encephalopathy New onset atrial fibrillation with rapid ventricular rate New onset cardiomyopathy with EF 30 to 35% NewRight frontal operculum infarct History of seizure disorder Hypertension Type 2 diabetes well controlled History of schizophrenia  Septic shock due to perforated descending colon  - WBC 14.7, afebrile - off pressors S/p ex-lap with ileocecectomy and partial descending colectomy left in discontinuity with abdominal wound VAC placement 07/12/18 Dr. Barry Dienes - POD#8 S/p ex-lap partial colectomy with closure of abdomen and creation of end ileostomy 07/14/18 Dr. Brantley Stage - POD#6 - 100 cc stool output in the last 24h, tolerating  TF at 30 cc/h - can start to wean TPN when tolerating TF at goal - 950 out of NGT in last 24 h - continue on LIWS - continue VAC changes M/W/F Leukocytosis - WBC 38.8 from 30.3 yesterday, pt afebrile  - CT abdomen/pelvis today to r/o intraabdominal source  FEN: IVF,TPN, TF at 30 cc/h ; NGT to LIWS; can give lactulose for hepatic encephalopathy  VTE: SCDs ID: current abx - Zosyn 7/29>>, rifaximin 8/2>>    LOS: 27 days    Brigid Re , [facility] 07/20/2018, 7:40 AM Pager: [phone] Consults: [phone] Mon-Fri 7:00 am-4:30 pm Sat-Sun 7:00 am-11:30 am

## 2018-07-21 ENCOUNTER — Inpatient Hospital Stay (HOSPITAL_COMMUNITY): Payer: Medicare Other

## 2018-07-21 ENCOUNTER — Encounter (HOSPITAL_COMMUNITY): Payer: Self-pay | Admitting: General Surgery

## 2018-07-21 DIAGNOSIS — I420 Dilated cardiomyopathy: Secondary | ICD-10-CM

## 2018-07-21 DIAGNOSIS — I4892 Unspecified atrial flutter: Secondary | ICD-10-CM

## 2018-07-21 DIAGNOSIS — E43 Unspecified severe protein-calorie malnutrition: Secondary | ICD-10-CM

## 2018-07-21 DIAGNOSIS — I5043 Acute on chronic combined systolic (congestive) and diastolic (congestive) heart failure: Secondary | ICD-10-CM

## 2018-07-21 LAB — CBC
HCT: 23.8 % — ABNORMAL LOW (ref 36.0–46.0)
HEMOGLOBIN: 7 g/dL — AB (ref 12.0–15.0)
MCH: 24.7 pg — ABNORMAL LOW (ref 26.0–34.0)
MCHC: 29.4 g/dL — ABNORMAL LOW (ref 30.0–36.0)
MCV: 84.1 fL (ref 78.0–100.0)
PLATELETS: 108 10*3/uL — AB (ref 150–400)
RBC: 2.83 MIL/uL — AB (ref 3.87–5.11)
RDW: 32.3 % — ABNORMAL HIGH (ref 11.5–15.5)
WBC: 34.8 10*3/uL — ABNORMAL HIGH (ref 4.0–10.5)

## 2018-07-21 LAB — LACTIC ACID, PLASMA: LACTIC ACID, VENOUS: 1.8 mmol/L (ref 0.5–1.9)

## 2018-07-21 LAB — BASIC METABOLIC PANEL
ANION GAP: 9 (ref 5–15)
BUN: 52 mg/dL — ABNORMAL HIGH (ref 8–23)
CALCIUM: 7.9 mg/dL — AB (ref 8.9–10.3)
CO2: 29 mmol/L (ref 22–32)
CREATININE: 0.81 mg/dL (ref 0.44–1.00)
Chloride: 112 mmol/L — ABNORMAL HIGH (ref 98–111)
Glucose, Bld: 164 mg/dL — ABNORMAL HIGH (ref 70–99)
Potassium: 4 mmol/L (ref 3.5–5.1)
Sodium: 150 mmol/L — ABNORMAL HIGH (ref 135–145)

## 2018-07-21 LAB — HEMOGLOBIN AND HEMATOCRIT, BLOOD
HEMATOCRIT: 30.5 % — AB (ref 36.0–46.0)
HEMOGLOBIN: 9.5 g/dL — AB (ref 12.0–15.0)

## 2018-07-21 LAB — HEPARIN LEVEL (UNFRACTIONATED): Heparin Unfractionated: 0.19 IU/mL — ABNORMAL LOW (ref 0.30–0.70)

## 2018-07-21 LAB — GLUCOSE, CAPILLARY
GLUCOSE-CAPILLARY: 140 mg/dL — AB (ref 70–99)
GLUCOSE-CAPILLARY: 181 mg/dL — AB (ref 70–99)
Glucose-Capillary: 138 mg/dL — ABNORMAL HIGH (ref 70–99)
Glucose-Capillary: 134 mg/dL — ABNORMAL HIGH (ref 70–99)
Glucose-Capillary: 119 mg/dL — ABNORMAL HIGH (ref 70–99)
Glucose-Capillary: 160 mg/dL — ABNORMAL HIGH (ref 70–99)
Glucose-Capillary: 160 mg/dL — ABNORMAL HIGH (ref 70–99)

## 2018-07-21 LAB — PROTIME-INR
INR: 1.64
PROTHROMBIN TIME: 19.3 s — AB (ref 11.4–15.2)

## 2018-07-21 LAB — FIBRINOGEN: FIBRINOGEN: 296 mg/dL (ref 210–475)

## 2018-07-21 LAB — PHOSPHORUS: PHOSPHORUS: 3.5 mg/dL (ref 2.5–4.6)

## 2018-07-21 LAB — MAGNESIUM: Magnesium: 1.9 mg/dL (ref 1.7–2.4)

## 2018-07-21 LAB — PREPARE RBC (CROSSMATCH)

## 2018-07-21 MED ORDER — ETOMIDATE 2 MG/ML IV SOLN
40.0000 mg | Freq: Once | INTRAVENOUS | Status: AC
  Administered 2018-07-22: 20 mg via INTRAVENOUS
  Filled 2018-07-21: qty 20

## 2018-07-21 MED ORDER — MIDAZOLAM HCL 2 MG/2ML IJ SOLN
4.0000 mg | Freq: Once | INTRAMUSCULAR | Status: AC
  Administered 2018-07-22: 2 mg via INTRAVENOUS
  Filled 2018-07-21: qty 4

## 2018-07-21 MED ORDER — ERYTHROMYCIN ETHYLSUCCINATE 200 MG/5ML PO SUSR
200.0000 mg | Freq: Three times a day (TID) | ORAL | Status: DC
  Administered 2018-07-21 – 2018-07-23 (×6): 200 mg
  Filled 2018-07-21 (×6): qty 5

## 2018-07-21 MED ORDER — PHENYLEPHRINE HCL-NACL 40-0.9 MG/250ML-% IV SOLN
0.0000 ug/min | INTRAVENOUS | Status: DC
  Administered 2018-07-21: 100 ug/min via INTRAVENOUS
  Administered 2018-07-22: 80 ug/min via INTRAVENOUS
  Administered 2018-07-22: 70 ug/min via INTRAVENOUS
  Administered 2018-07-22: 150 ug/min via INTRAVENOUS
  Administered 2018-07-23: 60 ug/min via INTRAVENOUS
  Filled 2018-07-21 (×7): qty 250

## 2018-07-21 MED ORDER — VECURONIUM BROMIDE 10 MG IV SOLR
10.0000 mg | Freq: Once | INTRAVENOUS | Status: AC
  Administered 2018-07-22: 5 mg via INTRAVENOUS
  Filled 2018-07-21: qty 10

## 2018-07-21 MED ORDER — SODIUM CHLORIDE 0.9% IV SOLUTION
Freq: Once | INTRAVENOUS | Status: AC
  Administered 2018-07-21: 11:00:00 via INTRAVENOUS

## 2018-07-21 MED ORDER — METOPROLOL TARTRATE 5 MG/5ML IV SOLN
5.0000 mg | INTRAVENOUS | Status: DC
  Administered 2018-07-21 – 2018-07-23 (×10): 5 mg via INTRAVENOUS
  Filled 2018-07-21 (×11): qty 5

## 2018-07-21 MED ORDER — VITAL AF 1.2 CAL PO LIQD
1500.0000 mL | ORAL | Status: DC
  Administered 2018-07-22: 1500 mL
  Filled 2018-07-21: qty 1500

## 2018-07-21 MED ORDER — ERYTHROMYCIN ETHYLSUCCINATE 200 MG/5ML PO SUSR
200.0000 mg | Freq: Three times a day (TID) | ORAL | Status: DC
  Filled 2018-07-21: qty 5

## 2018-07-21 MED ORDER — ZINC TRACE METAL 1 MG/ML IV SOLN
INTRAVENOUS | Status: AC
  Administered 2018-07-21: 18:00:00 via INTRAVENOUS
  Filled 2018-07-21: qty 1075.2

## 2018-07-21 MED ORDER — HEPARIN (PORCINE) IN NACL 100-0.45 UNIT/ML-% IJ SOLN
1100.0000 [IU]/h | INTRAMUSCULAR | Status: AC
  Administered 2018-07-21: 900 [IU]/h via INTRAVENOUS
  Filled 2018-07-21: qty 250

## 2018-07-21 MED ORDER — FENTANYL CITRATE (PF) 100 MCG/2ML IJ SOLN
200.0000 ug | Freq: Once | INTRAMUSCULAR | Status: AC
  Administered 2018-07-22: 100 ug via INTRAVENOUS
  Filled 2018-07-21: qty 4

## 2018-07-21 MED ORDER — DILTIAZEM LOAD VIA INFUSION
10.0000 mg | Freq: Once | INTRAVENOUS | Status: AC
  Administered 2018-07-21: 10 mg via INTRAVENOUS

## 2018-07-21 MED ORDER — PROPOFOL 500 MG/50ML IV EMUL
5.0000 ug/kg/min | Freq: Once | INTRAVENOUS | Status: DC
  Filled 2018-07-21: qty 50

## 2018-07-21 NOTE — Progress Notes (Signed)
ANTICOAGULATION CONSULT NOTE - Follow Up Consult  Pharmacy Consult for Heparin Indication: atrial fibrillation  No Known Allergies  Patient Measurements: Height: 5' 2.01" (157.5 cm) Weight: 200 lb 2.8 oz (90.8 kg) IBW/kg (Calculated) : 50.12 Heparin Dosing Weight: 70 kg  Vital Signs: Temp: 100.2 F (37.9 C) (08/06 2001) Temp Source: Axillary (08/06 2001) BP: 94/58 (08/06 2130) Pulse Rate: 89 (08/06 2130)  Labs: Recent Labs    07/19/18 0336  07/19/18 1138 07/20/18 0341 07/21/18 0421 07/21/18 0538 07/21/18 1341 07/21/18 2045  HGB 8.4*  --   --  8.1* 7.0*  --  9.5*  --   HCT 27.0*  --   --  26.0* 23.8*  --  30.5*  --   PLT 70*  --   --  93* 108*  --   --   --   LABPROT 18.6*  --   --  17.2* 19.3*  --   --   --   INR 1.57  --   --  1.41 1.64  --   --   --   HEPARINUNFRC  --   --   --   --   --   --   --  0.19*  CREATININE  --    < > 0.80 0.72  --  0.81  --   --    < > = values in this interval not displayed.    Estimated Creatinine Clearance: 75.5 mL/min (by C-G formula based on SCr of 0.81 mg/dL).  Assessment:   62 yo F with initial onset Afib this admission briefly started on heparin before transitioning to apixaban on 7/14. Anticoagulation had been held since 7/15 due to GI bleed. Restarted heparin gtt today per CCM today with plans to hold it tomorrow for trach placement. This am: Hgb 7.0,  plts 108.     Heparin level is subtherapeutic (0.19) tonight on 900 units/hr.   Hgb up to 9.5 after 1 unit transfused today.  No bleeding reported.  Goal of Therapy:  Heparin level 0.3-0.7 units/ml Monitor platelets by anticoagulation protocol: Yes   Plan:   Increase heparin drip to 1100 units/hr.  Next heparin level and CBC in am, ~6-8 hours after rate change.  Heparin to be held at [place] on 8/7 for trach at 2pm.    Arty Baumgartner, [place] Pager: [phone] 07/21/2018,9:50 PM

## 2018-07-21 NOTE — Progress Notes (Signed)
ANTICOAGULATION CONSULT NOTE - Initial Consult  Pharmacy Consult for heparin Indication: atrial fibrillation  No Known Allergies  Patient Measurements: Height: 5\' 2"  (157.5 cm) Weight: 200 lb 2.8 oz (90.8 kg) IBW/kg (Calculated) : 50.1 Heparin Dosing Weight: 71  Vital Signs: Temp: 100.4 F (38 C) (08/06 1114) Temp Source: Axillary (08/06 1114) BP: 121/83 (08/06 1114) Pulse Rate: 76 (08/06 1114)  Labs: Recent Labs    07/19/18 0336  07/19/18 1138 07/20/18 0341 07/21/18 0421 07/21/18 0538  HGB 8.4*  --   --  8.1* 7.0*  --   HCT 27.0*  --   --  26.0* 23.8*  --   PLT 70*  --   --  93* 108*  --   LABPROT 18.6*  --   --  17.2* 19.3*  --   INR 1.57  --   --  1.41 1.64  --   CREATININE  --    < > 0.80 0.72  --  0.81   < > = values in this interval not displayed.    Estimated Creatinine Clearance: 75.5 mL/min (by C-G formula based on SCr of 0.81 mg/dL).   Medical History: Past Medical History:  Diagnosis Date  . Allergic rhinitis   . Anemia   . Arthritis HX RIGHT ANKLE FX  . Cancer [facility])    breast, lumpectomy  . Depression    MAJOR  . Dyslipidemia   . GERD (gastroesophageal reflux disease)   . History of breast cancer DX 2010--  S/P LUMPECTOMY AND RADIATION -- NO RECURRENCE   ONCOLOGIST- DR Truddie Coco  . Hypertension   . Left flank pain   . Left ureteral calculus   . Migraines   . Schizophrenic disorder ([place])   . Tonic-clonic seizure disorder ([place]) X1  2010--  NO SEIZURE SINCE  . Type II or unspecified type diabetes mellitus without mention of complication, not stated as uncontrolled 03/16/2014    Medications:   Assessment: 62 yo F with initial onset Afib this admission briefly started on heparin before transitioning to apixaban on 7/14. Anticoagulation has been held since 7/15 due to GI bleed. Restarting heparin gtt today per CCM today with plans to hold it tomorrow for trach placement. Today's Hgb 7.0 plts 108, no documented bleeding.   Goal of Therapy:  Heparin  level 0.3-0.7 units/ml Monitor platelets by anticoagulation protocol: Yes   Plan: Heparin gtt 900 units/hr 6hr Heparin level Daily heparin level, CBC, monitor for bleeding  Harrietta Guardian, PharmD PGY1 Pharmacy Resident 07/21/2018    1:14 PM

## 2018-07-21 NOTE — Progress Notes (Addendum)
PULMONARY / CRITICAL CARE MEDICINE   Name: Brandi [name] MRN: [id number] DOB: [birth year]    ADMISSION DATE:  06/23/2018 CONSULTATION DATE: 06/29/2018  REFERRING MD: Dr. Bonner Puna  CHIEF COMPLAINT: Altered mental status  HISTORY OF PRESENT ILLNESS:  62 y/o F who presented to [facility] on 7/9 with abdominal pain, nausea, vomiting, decreased oral intake and weakness.    She was reportedly evaluated by her PCP prior to admit with elevated LFT's (AST 302, ALT 611, AP 380).  Follow up CT of the abdomen demonstrated prominent caudate lobe and surface irregularity of the liver.   In the ER, US of the abdomen confirmed the CT findings of the liver.  She was noted to be lethargic and have asterixis on exam.  Ammonia 22 (7/10) > 38 (7/12).  The patient had AFwRVR and was treated with cardizem.  She was admitted for further evaluation.  The patient was seen by Cardiology and transitioned to metoprolol for rate control due to decreased LVEF (30-35%). Labs showed a microcytic anemia with ferritin of 9.  GI was consulted for evaluation with recommendations for lactulose and eventual endoscopic evaluation.  She had ongoing encephalopathy and a CT of the head was obtained which showed a right frontal operculum CVA.  Neurology evaluated the patient and felt the stroke was likely related to AF but not the cause of her mental status.  She was recommended anticoagulation and passed a swallow evaluation converting from heparin to eliquis.  On 7/15, her confusion, tardive dyskinesia symptoms and lethargy worsened as well as her LFT's (AST 3942, ALT 1976, ALK Phos 404), renal function and WBC.  PCCM consulted for evaluation.  Anticoagulation stopped due to shock liver.  PSY meds were held due to severe tardive dyskinesia and liver dysfunction.  She had prolonged intubation due to poor mental status.  The patient was extubated on 7/25 without difficulty.  She was transitioned to [facility] on 7/26.  She developed agitated delirium overnight  7/26 and precedex was initiated by ELink.  PCCM called back 7/28 to assess patient as she had coffee ground emesis overnight, concern for aspiration, hypotension that responded to 537m saline bolus x2.  As day progressed on 7/28, she had progressive abdominal pain.  CT of the abd/pelvis demonstrated bowel perforation.  She was intubated, had brief arrest post intubation (9 min) and was taken to the OR.  Returned to ICU on epi, neo, vaso, levo, bicarb.  Pressor need improved overnight into 7/29.   SUBJECTIVE:  Exp lap yesterday after increasing WBC with CT abd/pelvis concerning for ischemic bowel, portal venous gas and pneumatosis, found negative for ischemic bowel, noted ascites  WBC 38.8-> 34.8, Tmax 99.8 CVP 4 Hgb down from 8.1 to 7, to be transfused 1 u PRBC per surgery RN reports TF restarted overnight but held x 2 hr by surgery after TF noted to be coming out of NGT, continuing TPN Weaning PSV 5/5 although mental status is a barrier to extubation Remains on cardizem gtt, Fluctuating BP, requiring phenylephrine overnight  VITAL SIGNS: BP (!) 117/102   Pulse (!) 101   Temp 99.8 F (37.7 C) (Oral)   Resp (!) 25   Ht '5\' 2"'  (1.575 m)   Wt 200 lb 2.8 oz (90.8 kg)   SpO2 99%   BMI 36.61 kg/m   HEMODYNAMICS: CVP:  [4 mmHg-11 mmHg] 4 mmHg  VENTILATOR SETTINGS: Vent Mode: PRVC FiO2 (%):  [40 %] 40 % Set Rate:  [18 bmp] 18 bmp Vt Set:  [[416  mL] 460 mL PEEP:  [5 cmH20] 5 cmH20 Plateau Pressure:  [13 cmH20-16 cmH20] 13 cmH20  INTAKE / OUTPUT:  Intake/Output Summary (Last 24 hours) at 07/21/2018 0956 Last data filed at 07/21/2018 7793 Gross per 24 hour  Intake 2825.6 ml  Output 3500 ml  Net -674.4 ml    PHYSICAL EXAMINATION: General:  Critically ill female mildly agitated on MV HEENT: pupils 2/reactive, +sclera edema/ icterus, L nare cortrak, R nare NGT Neuro: Eyes open, does not following commands, moves all extremities  CV: IRIR 100-130's PULM: even/mildly tachypneic on PSV  5/5, coarse/ diminished throughout GI: Midline abd wound vac with serous/orange minimal drainage; left ileostomy +gas/ stool, +BS Extremities: cool/dry, anasarca, RUE noted with swelling and bruising around AC/ PICC site, no redness Skin: no rashes, pale  LABS:  BMET Recent Labs  Lab 07/19/18 1138 07/20/18 0341 07/21/18 0538  NA 149* 150* 150*  K 3.4* 2.9* 4.0  CL 110 113* 112*  CO2 '31 30 29  ' BUN 46* 47* 52*  CREATININE 0.80 0.72 0.81  GLUCOSE 207* 179* 164*    Electrolytes Recent Labs  Lab 07/19/18 0524 07/19/18 1138 07/20/18 0341 07/21/18 0538  CALCIUM 7.5* 7.7* 7.8* 7.9*  MG 1.9  --  1.6* 1.9  PHOS 3.0  --  2.4* 3.5    CBC Recent Labs  Lab 07/19/18 0336 07/20/18 0341 07/21/18 0421  WBC 30.3* 38.8* 34.8*  HGB 8.4* 8.1* 7.0*  HCT 27.0* 26.0* 23.8*  PLT 70* 93* 108*    Coag's Recent Labs  Lab 07/19/18 0336 07/20/18 0341 07/21/18 0421  INR 1.57 1.41 1.64    Sepsis Markers Recent Labs  Lab 07/20/18 1432 07/20/18 1756 07/21/18 0538  LATICACIDVEN 1.9 2.0* 1.8    ABG No results for input(s): PHART, PCO2ART, PO2ART in the last 168 hours.  Liver Enzymes Recent Labs  Lab 07/16/18 0910 07/18/18 0930 07/20/18 0341  AST 89* 72* 74*  ALT 51* 50* 60*  ALKPHOS 352* 710* 505*  BILITOT 9.5* 10.1* 8.4*  ALBUMIN 2.2* 1.6* 1.6*    Cardiac Enzymes No results for input(s): TROPONINI, PROBNP in the last 168 hours.  Glucose Recent Labs  Lab 07/20/18 1832 07/20/18 1955 07/20/18 2349 07/21/18 0413 07/21/18 0754 07/21/18 0759  GLUCAP 158* 166* 150* 140* 181* 138*    Imaging  STUDIES:  [place] ABD 7/6 >> possible cirrhosis, gallbladder surgically absent CT Head 7/9 >> atrophy with small vessel chronic ischemic changes of deep cerebral white matter CT Renal Study 7/9 >> no acute findings, bilateral non-obstructing renal stones ECHO 7/9 >> LV mildly dilated, wall thickness was increased in a pattern of moderate LVH, LVEF 30-35%, severe hypokinesis of  the anteroseptal, anterior, inferoseptal & apical myocardium, mild to moderate MV regurgitation, LA severely dilated, RV moderately dilated, RA mod-severely dilated, PA pressure 64mHg, trivial pericardial effusion. CTA Head, Neck 7/12 >> minimal calcified plaque at the carotid bifurcation regions but no stenosis or irregularity, no large vessel occlusions, unable to specifically identify the missing branch vessel responsible for the right posterior frontal infarction but this is presumably an occluded M3 or distal branch CT Head w/o 7/12 >> the small right middle cerebral artery distribution, posterolateral frontal lobe infarct has evolved since the prior CT, it is now seen as a well defined area of hypoattenuation, no new areas of infarct, no ICH CT head 7/15 >> Redemonstration of right posterior frontal lobe infarct. No acute intracranial hemorrhage or new infarct identified.  Atrophy with chronic small vessel ischemia. CT abd/pelvis 7/18 >  Diffuse dilation of the colon with large amount of ascending colonic stool.  CT ABD/Pelvis 7/28 >> free air in the abd consistent with bowel rupture, unable to identify source, bilateral pleural effusions (stable), non-obstructive kidney stones EEG 8/2 >no seizure act  CT abd/ pelvis 8/5 >> 1. Small bowel pneumatosis. Air within draining veins of the small bowel, the superior mesenteric vein, and intrahepatic branches of the portal vein. 2. Marked gastric wall thickening in the fundus of the stomach. 3. Suspected right femoral DVT. 4. Marked ascites. 5. Increasing bilateral effusions with underlying opacities. 6. Mild atherosclerotic changes in the abdominal aorta.  CULTURES: BCx2 7/10 >> negative  Cdiff 7/20 >> negative  Sputum 7/20 >> negative Sputum 7/29>>Kleb Pne SS zoysn BC 7/28 >neg  BC 8/4 >> 8/5 MRSA PCR >> neg  ANTIBIOTICS: Vanco 7/15 >> 7/21 Zoysn 7/15 >> 7/22 Unasyn 7/28 >> 7/28 Diflucan 7/28 >> 7/31 Zosyn 7/28 >>  Vanco 7/28  >>7/31  SIGNIFICANT EVENTS: 7/09  Admit  7/15  PCCM consulted with AMS, acute liver failure 7/19  Had been receiving fluid challenges for hypotension. 7/20   Early in the a.m. hours drop blood pressure.  Placed on pressors.  Non-anion gap acidosis noted.  Cortisol 29.2 CBC remains elevated blood cell count remains elevated.  7/21  no significant fever.  White cell count cut in half.  Chest x-ray looks improved.  We resumed lactulose and rifaximin yesterday, also resumed spironolactone.  Still obtunded  7/22  Free water increased 7/23  800 ml in flexiseal overnight.  Off home PSY meds. Mg/K low.  Na 153 on free water. Continues to have tardive dyskinesia.  7/24 Tmax 100.2.  I/O- positive 8.6L for admit / 2125 of stool out 7/23. 7/26  extubated on 7/25. Remains encephalopathic. NA still elevated  7/28  PCCM called back for hypotension on precedex, coffee ground emesis. Concern for aspiration, hypotension that responded to 555m saline bolus x2.  TF held overnight with nausea/abd pain.  NGT placed due to vomiting. ABD pain increased > CT abd with bowel perforation > to OR for resection/wash out Wound vac, /ileostomy, remains intubated  LINES/TUBES: ETT 7/15 >> 7/25 7/16 Foley >> R DL PICC 7/26 >> ETT 7/28 >>  L Rad Aline 7/28 >> out R IJ dual lumen 7/28 >>  DISCUSSION: 62y/o F admitted with weakness, decreased intake and lethargy.  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  She developed AMS > small infarct thought not related to AMS.  Recommended for anticoagulation by Cardiology/Neurology, was transitioned from heparin to eliquis.  The patient deteriorated on 7/15 with a rise in LFT's, AKI, confusion/AMS, worsening of tardive dyskinesia type symptoms.  Transferred to ICU.  ICU course significant for slow improvement of mental status with periods of agitation where suspect to be related to being off her maintenance PSY regimen (due to liver dysfunction). She has been seen by PSY (see  recommendations).  Her electrolytes have difficult to manage in the setting of high stool output & poor nutritional status. She has been on free water and dextrose via IV. She pulled her flexiseal out multiple times overnight 7/28.  ABD pain worsened 7/28 during the day > CT ABD with perforation, to OR with Dr. BBarry Dienes  07/15/2018 to wean her altered mental status is a block to any attempt at extubation. 07/16/2018 TPN to start Patient to less agitated on 07/17/2008 8/4 TF restarted, TPN weaning  8/5 continued A. fib with RVR  remains on Cardizem drip  HR up and down; Low-grade fevers, white blood cell count trending up 38,000;  On tube feeds at 30cc/hr weaning TPN; NGT with bilious output (950 cc /24hr); Weaning today 5/5 , HR tr up.  Taken for emergent Exp lap yesterday after CT abd/pelvis concerning for ischemic bowel, portal venous gas and pneumatosis, found negative for ischemic bowel, noted ascites    ASSESSMENT / PLAN:  Active issues   Acute Hypoxic Respiratory Failure - in setting of bowel perforation, shock Bilateral Pleural Effusions - suspect in setting of liver disease, HF, AKI Klebsiella pneumoniae in sputum P: Full MV support with daily PSV; however give poor mental status and tenuous respiratory status Husband consent to trach, Dr. Nelda Marseille to perform trach 8/7 at 1400 VAP protocol  Day 9/x zosyn (ongoing for possible intra-abd)  Acute metabolic & hepatic encephalopathy / acute delirium - improved CVA - Right Frontal Operculum infarct - felt related to AF/embolic but not all cause of mental status Right-sided weakness Seizure disorder-EEG 8/2 no seizure act  Schizophrenia Tardive Dyskinesia - improved 7/28  P: Cont Keppra/Vimpat   HFrEF - LVEF 30% AF with RVR>recurrent 8/4 >cardizem restarted -rate improved  Hypotension - 7/28, suspect related to intravascular volume depletion, sedation>resolved off pressors  Hx HTN   P: Off anticoagulation Cont lopressor 28m q 6 (not been  getting consistent doses secondary to fluctuating BP) Continue cardizem gtt Neo for MAP > 65 Consider diuresis when off pressors  AGMA - in setting of bowel perforation, shock -resolved  Fluid and Electrolyte imbalance: Hypernatremia / Hyperchloremia, Hypokalemia  Hypernatremia  Hypomagnesium  Recent Labs  Lab 07/19/18 1138 07/20/18 0341 07/21/18 0538  K 3.4* 2.9* 4.0    P: Replete electrolytes as needed TPN per pharm Free water 100 q 8  [place] Cirrhosis with Hepatic Encephalopathy  - unclear in what precipitated change in events LFTs/ decompensation 7/15 - 7/15 UKorealiver w/doppler with no evidence of portal venous thrombosis  -LFTs improved 8/4 Amnonia 20 P: Intermittently  LFTs Cont lactulose 20g BID, rifaximin TID  Perforated Viscus  At Risk Abscess Development  - mid descending colon perforation s/p partial colectomy , ileostomy  - ex lap 8/5 neg for ischemic bowel, +ascites P: Per general surgery Wound vac  Defer zosyn stop date to surgery  Leukocytosis, questionable sepsis however etiology unclear.   Klebsiella pneumoniae in sputum- adequately treated with zosyn  P: Defer zosyn to surgery Repeat MRSA PCR neg Lines in place since - R IJ 7/28 and R PICC 7/26, follow blood cultures closely  Coagulopathy Thrombocytopenia plts 32->44>73>81>70>93 Anemia Recent Labs    07/20/18 0341 07/21/18 0421  HGB 8.1* 7.0*  plts 32 Lab Results  Component Value Date   INR 1.64 07/21/2018   INR 1.41 07/20/2018   INR 1.57 07/19/2018   RUE swelling  P: Trend Monitor for bleeding  SCDs only  Doppler study to r/o DVT in RUE  Coffee Ground Emesis - resolved Vomiting-resolved  P: Continue protonix  NG to IWS   DM 2 CBG (last 3)  Recent Labs    07/21/18 0413 07/21/18 0754 07/21/18 0759  GLUCAP 140* 181* 138*    P:  Continue sliding-scale insulin adjusted per pharm in TPN  Hx Breast Cancer P: Follow-up as outpatient  DVT prophylaxis: SCD's SUP: PPI   Diet: NPO, TPN to started 07/16/2018, tube feeds started 07/17/2017 Activity: BR Disposition : ICU, critically ill   FAMILY  - Updates: Husband updated by phone by Dr. YNelda Marseillevia phone.  CCT 45 mins  Kennieth Rad, AGACNP-BC [facility] Pgr: [phone] or if no answer [phone] 07/21/2018, 11:00 AM  Attending Note:  62 year old female with extensive PMH who presents to PCCM with respiratory failure, acute encephalopathy and abdominal sepsis.  On exam, patient is weaning very well today but not awake enough to tolerate extubation.  I reviewed CXR myself, ETT is in good position and infiltrate noted.  Discussed with PCCM-NP.  Spoke with CCS.  Will begin heparin drip for DVT.  Continue zosyn for now.  Spoke with the patient's husband over the phone.  Will proceed with trach tomorrow.  F/u on cultures.  PCCM will continue to follow.  The patient is critically ill with multiple organ systems failure and requires high complexity decision making for assessment and support, frequent evaluation and titration of therapies, application of advanced monitoring technologies and extensive interpretation of multiple databases.   Critical Care Time devoted to patient care services described in this note is  35  Minutes. This time reflects time of care of this signee Dr [name]. This critical care time does not reflect procedure time, or teaching time or supervisory time of PA/NP/Med student/Med Resident etc but could involve care discussion time.  Rush Farmer, M.D. [facility] Pulmonary/Critical Care Medicine. Pager: [phone]. After hours pager: [phone].

## 2018-07-21 NOTE — Progress Notes (Signed)
After patient was turned her NGT dumped a dark tan liquid. CCS called and TF turned off. Will continue to monitor closely. Bartholomew Crews, RN 07/21/2018 4:36 PM

## 2018-07-21 NOTE — Progress Notes (Signed)
Patient went into aflutter, CCM called, bolus of 10 of cardizem with rate of cardizem increased to 10. Will continue to monitor. Modena Morrow E, RN 07/21/2018 4:00 PM

## 2018-07-21 NOTE — Progress Notes (Signed)
Called by RN that patient has gone from Afib to now Aflutter, verified with EKG, rate around despite ongoing lopressor and now at cardizem gtt at 15 mg/hr.  No significant electrolyte abnormalities and patient placed back on full MV support from PSV at 1500.  Last seen by cardiology 7/21, will re-consult for any further recommendations.

## 2018-07-21 NOTE — Transfer of Care (Signed)
Immediate Anesthesia Transfer of Care Note  Patient: Brandi [name]  Procedure(s) Performed: EXPLORATORY LAPAROTOMY (N/A Abdomen)  Patient Location: ICU  Anesthesia Type:General  Level of Consciousness: sedated, patient cooperative and Patient remains intubated per anesthesia plan  Airway & Oxygen Therapy: Patient remains intubated per anesthesia plan and Patient placed on Ventilator (see vital sign flow sheet for setting)  Post-op Assessment: Report given to RN and Post -op Vital signs reviewed and stable  Post vital signs: Reviewed and stable  Last Vitals:  Vitals Value Taken Time  BP 98/56 07/21/2018  7:45 AM  Temp    Pulse 105 07/21/2018  7:56 AM  Resp 22 07/21/2018  7:56 AM  SpO2 100 % 07/21/2018  7:56 AM  Vitals shown include unvalidated device data.  Last Pain:  Vitals:   07/21/18 0415  TempSrc: Axillary  PainSc:       Patients Stated Pain Goal: 0 (19/14/78 2956)  Complications: No apparent anesthesia complications

## 2018-07-21 NOTE — Progress Notes (Signed)
PHARMACY - ADULT TOTAL PARENTERAL NUTRITION CONSULT NOTE   Pharmacy Consult: TPN Indication: Post-operative ileus  Patient Measurements: Height: '5\' 2"'  (157.5 cm) Weight: 200 lb 2.8 oz (90.8 kg) IBW/kg (Calculated) : 50.1 TPN AdjBW (KG): 52.8 Body mass index is 36.61 kg/m. 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 On 7/15, patient's clinical status worsened and increased confusion and multiorgan failure including respiratory failure requiring intubation. Patient improved and was extubated 7/25. However, patient had subsequent cardiac arrest on 7/28 and found to have perforation of descending colon and near perforation of cecum requiring ileocecectomy and partial colectomy with discontinuity and open abdomen. She returned to the OR on 7/30 with creation of end ileostomy and closure of abdomen. Patient has been NPO >7 days and with limited nutrition since admission (22 days); therefore, is at high risk for refeeding.  GI: prealbumin improved to 8.4, LBM 8/5, NG O/P up to 1626m, drain O/P 1043m(down), ileostomy O/P down to 2530m 8/5 CT showed gas and pneumotosis, s/p ex-lap.  PPI PT + Pepcid in TPN Endo: no hx DM - CBGs improving Insulin requirements in the past 24 hours: 20 units + 25 units in TPN Lytes: Na/CL 150/112 (free water 100m36mh), others WNL Renal: SCr 0.81 stable, BUN 52 - UOP 0.7 ml/kg/hr, Lasix 40 IV x1 yesterday, +18.1L Pulm: FiO2 40%  Cards: EF 30-35%, CVP 10, Afib with HR 80-90s on IV metoprolol, diltiazem gtt. Hepatobil: AST/ALT 74/60, alk phos down 505, tbili down 8.4. TG 197 Neuro: HE - lactulose/rifaximin.  Sedated on Fentanyl. Sz history on Vimpat + Keppra.  GCS 8, CPOT 0, RASS 1 ID: [email or web address] for IAI - afebrile, WBC down 34.8, LA down 1.8 TPN Access: PICC placed 07/10/18 TPN start date: 07/16/18  Nutritional Goals (per RD rec on 8/5): 1658 kCal,  110-130gm protein and >/= 1.7L fluid per day  Current Nutrition:  TPN  Vital AF 1.2 at 20 ml/hr (= 576 kCal, 36gm protein, 53g CHO)   Plan:   Increase TPN to 70 ml/hr, providing 108g AA and 277g CHO for a total of 1373 kCal, meeting >85% of total needs. Hold 20% ILE for first 7 days for ICU patients per ASPEN guidelines (start date 8/8) TPN + TF provide 144g AA and 1948 kCal, exceeding goals if patient tolerates TF Electrolytes in TPN: remove Na on 8/5, max acetate - fluctuation with rate increase Daily multivitamin in TPN Remove trace elements d/t elevated tbili - add back zinc 5mg,63mromium 10 mcg, and selenium 60 mcg Remove Pepcid as patient is on Protonix Continue resistant SSI Q4H + increase regular insulin in TPN to 40 units (total CHO = 330g) F/U AM labs, CBGs, volume status   Joseluis Alessio D. Necha Harries,Mina MarblermD, BCPS, BCCCPGrand Coteau2019, 9:31 AM

## 2018-07-21 NOTE — Progress Notes (Signed)
Progress Note  Patient Name: Brandi [name] Date of Encounter: 07/21/2018  Primary Cardiologist: Mertie Moores, MD   Subjective   Asked to come back and see patient due to recurrent atrial flutter with RVR.  Patient had been followed in the past cardiology signed off on 07/05/2018.  At that time she was in atrial fibrillation.  Inpatient Medications    Scheduled Meds: . artificial tears   Both Eyes Q8H  . chlorhexidine gluconate (MEDLINE KIT)  15 mL Mouth Rinse BID  . Chlorhexidine Gluconate Cloth  6 each Topical Daily  . erythromycin ethylsuccinate  200 mg Per Tube Q8H  . [START ON 07/22/2018] etomidate  40 mg Intravenous Once  . [START ON 07/22/2018] fentaNYL (SUBLIMAZE) injection  200 mcg Intravenous Once  . free water  100 mL Per Tube Q8H  . insulin aspart  0-20 Units Subcutaneous Q4H  . lactulose  20 g Oral BID  . mouth rinse  15 mL Mouth Rinse 10 times per day  . metoprolol tartrate  5 mg Intravenous Q4H  . [START ON 07/22/2018] midazolam  4 mg Intravenous Once  . pantoprazole sodium  40 mg Per Tube Daily  . rifaximin  550 mg Oral TID  . sodium chloride flush  10-40 mL Intracatheter Q12H  . [START ON 07/22/2018] vecuronium  10 mg Intravenous Once   Continuous Infusions: . diltiazem (CARDIZEM) infusion 10 mg/hr (07/21/18 1817)  . feeding supplement (VITAL AF 1.2 CAL) Stopped (07/21/18 1635)  . fentaNYL infusion INTRAVENOUS 150 mcg/hr (07/21/18 0833)  . heparin 900 Units/hr (07/21/18 1344)  . lacosamide (VIMPAT) IV 100 mg (07/21/18 1009)  . levETIRAcetam 1,000 mg (07/21/18 1219)  . phenylephrine (NEO-SYNEPHRINE) Adult infusion Stopped (07/21/18 1520)  . piperacillin-tazobactam (ZOSYN)  IV 3.375 g (07/21/18 1625)  . [START ON 07/22/2018] propofol    . TPN ADULT (ION) 70 mL/hr at 07/21/18 1802   PRN Meds: albuterol, fentaNYL, LORazepam, [DISCONTINUED] ondansetron **OR** ondansetron (ZOFRAN) IV, sodium chloride flush   Vital Signs    Vitals:   07/21/18 1730 07/21/18 1745  07/21/18 1800 07/21/18 1921  BP:      Pulse: 100 87 (!) 117 (!) 118  Resp: 18 18 (!) 26 (!) 25  Temp:      TempSrc:      SpO2: 96% 97% 96% 97%  Weight:      Height:        Intake/Output Summary (Last 24 hours) at 07/21/2018 1944 Last data filed at 07/21/2018 1817 Gross per 24 hour  Intake 2397.38 ml  Output 1625 ml  Net 772.38 ml   Filed Weights   07/20/18 0217 07/21/18 0500 07/21/18 1214  Weight: 201 lb 8 oz (91.4 kg) 200 lb 2.8 oz (90.8 kg) 200 lb 2.8 oz (90.8 kg)    Telemetry    Atrial flutter with RVR of 210 bpm- Personally Reviewed  ECG    Atrial flutter with variable block with anterior and inferior infarcts.  No acute ST changes.- Personally Reviewed  Physical Exam   GEN:  Intubated and sedated and very ill-appearing Neck: No JVD Cardiac:  Tachycardic, no murmurs, rubs, or gallops.  Respiratory: Clear to auscultation bilaterally anteriorly  GI: Soft, nontender, non-distended  MS:  Bilateral pitting lower extremity edema no deformity. Neuro:   Cannot assess due to sedation Psych: Cannot assess due to sedation  Labs    Chemistry Recent Labs  Lab 07/16/18 0910  07/18/18 0930  07/19/18 1138 07/20/18 0341 07/21/18 0538  NA 145   < >  147*   < > 149* 150* 150*  K 3.9   < > 3.1*   < > 3.4* 2.9* 4.0  CL 105   < > 111   < > 110 113* 112*  CO2 25   < > 29   < > _0 GLUCOSE 104*   < > 202*   < > 207* 179* 164*  BUN 49*   < > 53*   < > 46* 47* 52*  CREATININE 1.41*   < > 0.99   < > 0.80 0.72 0.81  CALCIUM 7.9*   < > 7.7*   < > 7.7* 7.8* 7.9*  PROT 4.0*  --  3.7*  --   --  3.8*  --   ALBUMIN 2.2*  --  1.6*  --   --  1.6*  --   AST 89*  --  72*  --   --  74*  --   ALT 51*  --  50*  --   --  60*  --   ALKPHOS 352*  --  710*  --   --  505*  --   BILITOT 9.5*  --  10.1*  --   --  8.4*  --   GFRNONAA 39*   < > 60*   < > >60 >60 >60  GFRAA 45*   < > >60   < > >60 >60 >60  ANIONGAP 15   < > 7   < > _1 < > = values in this interval not displayed.      Hematology Recent Labs  Lab 07/19/18 0336 07/20/18 0341 07/21/18 0421 07/21/18 1341  WBC 30.3* 38.8* 34.8*  --   RBC 3.40* 3.24* 2.83*  --   HGB 8.4* 8.1* 7.0* 9.5*  HCT 27.0* 26.0* 23.8* 30.5*  MCV 79.4 80.2 84.1  --   MCH 24.7* 25.0* 24.7*  --   MCHC 31.1 31.2 29.4*  --   RDW 28.7* 29.7* 32.3*  --   PLT 70* 93* 108*  --     Cardiac EnzymesNo results for input(s): TROPONINI in the last 168 hours. No results for input(s): TROPIPOC in the last 168 hours.   BNPNo results for input(s): BNP, PROBNP in the last 168 hours.   DDimer No results for input(s): DDIMER in the last 168 hours.   Radiology    Ct Abdomen Pelvis W Contrast  Addendum Date: 07/20/2018   ADDENDUM REPORT: 07/20/2018 14:19 ADDENDUM: Findings of venous gas discussed with Dr. Nelda Marseille. Findings of DVT called to the patient's nurse, Maudie Mercury. Electronically Signed   By: Dorise Bullion III M.D   On: 07/20/2018 14:19   Result Date: 07/20/2018 CLINICAL DATA:  Status post colectomy and ileostomy after bowel perforation. Jaundice and leukocytosis. EXAM: CT ABDOMEN AND PELVIS WITH CONTRAST TECHNIQUE: Multidetector CT imaging of the abdomen and pelvis was performed using the standard protocol following bolus administration of intravenous contrast. CONTRAST:  159m OMNIPAQUE IOHEXOL 300 MG/ML  SOLN COMPARISON:  None. FINDINGS: Lower chest: Bilateral pleural effusions with underlying pulmonary opacities and atelectasis. The effusions and atelectasis have worsened in the interval. No other acute abnormalities in the lower chest. Hepatobiliary: Branching air seen in the peripheral liver, particularly the left hepatic lobe. Branching air is also seen in the caudate. There is air in the superior mesenteric vein as seen on coronal image 70. There is also air in multiple small bowel draining veins. No liver masses. The patient is  status post cholecystectomy. Portal vein is patent. Pancreas: Unremarkable. No pancreatic ductal dilatation or  surrounding inflammatory changes. Spleen: Normal in size without focal abnormality. Adrenals/Urinary Tract: Multiple small cysts in the kidneys without obstruction. The ureters are normal with no stones. The bladder is unremarkable but poorly evaluated due to a Foley catheter. Adrenal glands are normal. Stomach/Bowel: A feeding tube terminates just beyond the ligament of Treitz. There appears to be marked wall thickening in the fundus of the stomach well seen on coronal image 101. An NG tube terminates in the gastric fundus. The remainder of the stomach is unremarkable. There is diffuse dilatation of the small bowel extending from the stomach to the level of the ileostomy. Pneumatosis is identified. Fecal material seen within the small bowel. There is air in multiple small bowel draining veins. Colon is been removed. Vascular/Lymphatic: Mild atherosclerotic changes in the abdominal aorta. Suggested filling defect in the right femoral vein as seen on series 3, image 100 and coronal image 80. No adenopathy. Reproductive: Uterus and bilateral adnexa are unremarkable. Other: There is air in small bowel draining veins. No definitive free air identified on this study. Musculoskeletal: No acute or significant osseous findings. IMPRESSION: 1. Small bowel pneumatosis. Air within draining veins of the small bowel, the superior mesenteric vein, and intrahepatic branches of the portal vein. 2. Marked gastric wall thickening in the fundus of the stomach. 3. Suspected right femoral DVT. 4. Marked ascites. 5. Increasing bilateral effusions with underlying opacities. 6. Mild atherosclerotic changes in the abdominal aorta. Findings called to the patient's doctor. Electronically Signed: By: Dorise Bullion III M.D On: 07/20/2018 14:00   Dg Chest Port 1 View  Result Date: 07/21/2018 CLINICAL DATA:  Respiratory failure and shortness of Breath EXAM: PORTABLE CHEST 1 VIEW COMPARISON:  07/20/18 FINDINGS: Cardiac shadow is mildly  enlarged. Endotracheal tube, nasogastric catheter and feeding catheter are again seen and stable. Right jugular and right subclavian central lines are seen and stable. The overall inspiratory effort is stable. Changes of bilateral pleural effusions and bibasilar atelectasis are again seen and stable. No new focal abnormality is seen. IMPRESSION: No significant change from the prior exam. Electronically Signed   By: Inez Catalina M.D.   On: 07/21/2018 07:06   Dg Chest Port 1 View  Result Date: 07/20/2018 CLINICAL DATA:  Hypoxia EXAM: PORTABLE CHEST 1 VIEW COMPARISON:  July 18, 2018 FINDINGS: Endotracheal tube tip is 6 mm above the carina. Central catheter tips are in the superior vena cava. Feeding tube and nasogastric tube tips are below the diaphragm. No pneumothorax. There are pleural effusions bilaterally with bibasilar atelectasis. Heart is upper normal in size with pulmonary vascularity normal. No adenopathy. There are surgical clips in left axilla. IMPRESSION: Tube and catheter positions as described without pneumothorax. Note that the endotracheal tube tip is close to the carina. Advise withdrawing endotracheal tube 2-3 cm. Bilateral pleural effusions with bibasilar atelectasis. No frank consolidation. Stable cardiac silhouette. Electronically Signed   By: Lowella Grip III M.D.   On: 07/20/2018 07:14    Cardiac Studies   2D echo 06/23/2018 Study Conclusions  - Left ventricle: The cavity size was mildly dilated. Wall   thickness was increased in a pattern of moderate LVH. Systolic   function was moderately to severely reduced. The estimated   ejection fraction was in the range of 30% to 35%. Severe   hypokinesis of the anteroseptal, anterior, inferoseptal, and   apical myocardium. - Mitral valve: Mildly to moderately calcified annulus. Mobility  was moderately restricted. There was mild to moderate   regurgitation. - Left atrium: The atrium was severely dilated. - Right ventricle: The  cavity size was moderately dilated. Wall   thickness was normal. Systolic function was moderately reduced. - Right atrium: The atrium was moderately to severely dilated. - Pulmonary arteries: Systolic pressure was moderately to severely   increased,estimated at approximately 45 mmHg. - Pericardium, extracardiac: A trivial pericardial effusion was   identified.  Patient Profile     62 y.o. female admitted 06/23/2018 with abdominal pain, nausea, vomiting and decreased oral intake as well as weakness.  Found to have elevated LFTs along with asterixis and lethargy.  Noted to be in atrial fibrillation with RVR with 2D echocardiogram showing EF 30 to 35%.  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 Fib.   subsequently developed shock liver complicated by severe tardive dyskinesia.  She has had a prolonged intubation due to poor mental status and was extubated on 725 by patient and delirium and had coffee-ground emesis with possible aspiration and hypotension and found to have bowel perforation on CT on 728 and was reintubated and had a brief arrest post intubation.  She was taken to the OR return to the IC you on multiple vasopressors.  Repeat exploratory lap yesterday for increasing white blood cell count and CT concerning for ischemic bowel portal venous gas and pneumatosis found no evidence of ischemic bowel but did have ascites.  Now on IV Cardizem drip and IV Lopressor for control of atrial flutter but requiring intermittent phenylephrine to maintain blood pressure.  Assessment & Plan    1.   Recurrent atrial flutter with RVR - I have reviewed telemetry over the past 2 to 3 days.  Patient appears to have been in atrial flutter since 8/4 but had been in sinus rhythm prior to that. - Currently on IV Cardizem drip as well as Lopressor 5 mg every 6 hours - Her heart rate seems to respond better to IV Lopressor and in the setting  of LV dysfunction would prefer to have her on higher doses of Lopressor then a calcium channel blocker which could suppress LV dysfunction further -Increase Lopressor to 5 mg every 4 hours to try to get heart rate under better control and wean IV Cardizem drip off -Unclear what is driving her atrial flutter but in the setting of lower extremity DVT and possible upper extremity DVT need to consider acute PE Continue IV heparin drip.  2.  Dilated cardiomyopathy -2D echo showed EF 30 to 35% on 06/23/2018 with severe HK of the anterior septal, anterior, inferior septal and apical myocardium.  RV was moderately dilated with moderate RV dysfunction as well. -Soft BP has limited addition of heart failure therapy -Continue beta-blocker for LV dysfunction and heart rate control -BP too soft for addition of ACE or ARB  3.  Acute hypoxic respiratory failure in the setting of bowel perforation and shock -She remains intubated and sedated with tenuous respiratory status -Plan is with x-ray tomorrow -Treatment per CCM  4.  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 fib as well as seizure disorder, schizophrenia and tardive dyskinesia -She is on Keppra and Vimpat  The patient is critically ill with multiple organ systems failure and requires high complexity decision making for assessment and support, frequent evaluation and titration of therapies, application of advanced monitoring technologies and extensive interpretation of multiple databases.  Critical Care Time devoted to patient care services described in this note independent of APP time is 60 minutes with >50% of time spent in direct patient care.         For questions or updates, please contact [place] Please consult [email or web address] for contact info under Cardiology/STEMI.      Signed, Fransico Him, MD  07/21/2018, 7:44 PM

## 2018-07-21 NOTE — Progress Notes (Signed)
Nutrition Follow-up  DOCUMENTATION CODES:   Severe malnutrition in context of acute illness/injury  INTERVENTION:   TPN per Pharmacy: TPN goal to meet at least 90% of needs. Recommend continuing TPN until pt tolerating TF at goal rate  as  multiple attempts to titrate to goal rate of TF previously have failed.   Vital AF 1.2 @ 20 ml/hr via Cortrak tube as tolerated   NUTRITION DIAGNOSIS:   Severe Malnutrition related to acute illness(VDRF, cardiac arrest, bowel perforation, wound vac, end ilostomy/partial colectomy, NASH cirrhosis with ascites, intolerance to TF) as evidenced by moderate fat depletion, moderate muscle depletion, edema, energy intake < or equal to 50% for > or equal to 5 days.  Being addressed via TPN  GOAL:   Patient will meet greater than or equal to 90% of their needs  Progressing  MONITOR:   TF tolerance, Vent status, Labs, Weight trends, I & O's, Skin(TPN)  REASON FOR ASSESSMENT:   Consult, Ventilator Enteral/tube feeding initiation and management  ASSESSMENT:   62 yo female admitted 7/9 with weakness, anorexia and lethargy with NASH cirrhosis with hepatic encephalopathy, new onset Afib with RVR, new onset HFrEF 30-35%, anemia. On 7/15,  pt's condition worsened with increased confusion/lethargy, tardive dyskinesia symptoms, AKI, shock liver and respiratory failure requiring intubation.  Pt with hx of HTN, HLD, seizure disorder, left breast cancer s/p lumpectomy 2008, GERD, depression, DM   7/15 Intubated, Inadequate nutrition x 7 days 7/17 Trickle TF started but did not tolerate, vomiting 7/18 CT abdomen, colonic ileus with stool in ascending colon 7/21 Trickle TF re-started 7/22 Post-pyloric Cortrak tube placed 7/25-Extubated, TF at goal 7/28-Cardiac arrest, intubated,perforated descending colon and near perforation of cecum. Taken forIleocecectomy and partialcolectomy leftin discontinuity,open abdomen with wound vac 7/30 Return to OR,  partial colectomy with closure of abdomen and creation of end ileostomy 8/1 TPN started 8/2 Trickle TF started 8/5 CT abdomen:  PV gas and pneumatosis (air within draining veins of the small bowel, the superior mesenteric vein, and intrahepatic branches of the portal vein); pt taken emergently to OR for exploration to eval for ischemic bowel 8/5 Exploratory lap with closure and VAC placement; no ischemic bowel. +ascites, 2.6 L removed  Pt remains on vent support; noted plan for trach  NG tube to LIS, Cortrak at LOT Per RN, TF restarted during night shift at 20 ml/hr but appeared to be coming out NG tube into cannister so TF placed on hold. On rounds this AM, TF just restarted at 20 ml/hr  TPN @ 25 ml/hr this AM; plan to increase to 70 ml/hr tonight. TPN @ 70 provides 108 g of protein, 1373 kcals.   Labs: sodium 150 Meds: lactulose, xifaxan, ss novolog  NUTRITION - FOCUSED PHYSICAL EXAM:    Most Recent Value  Orbital Region  Moderate depletion  Upper Arm Region  No depletion  Thoracic and Lumbar Region  No depletion  Buccal Region  Mild depletion  Temple Region  Moderate depletion  Clavicle Bone Region  Mild depletion  Clavicle and Acromion Bone Region  Unable to assess  Scapular Bone Region  Mild depletion  Dorsal Hand  Unable to assess  Patellar Region  Unable to assess  Anterior Thigh Region  Unable to assess  Posterior Calf Region  Unable to assess  Edema (RD Assessment)  Severe       Diet Order:   Diet Order           Diet NPO time specified  Diet effective midnight  Diet NPO time specified  Diet effective midnight        Diet NPO time specified  Diet effective now          EDUCATION NEEDS:   Education needs have been addressed  Skin:  Skin Assessment: Skin Integrity Issues: Skin Integrity Issues:: Wound VAC Wound Vac: abdomen Other: MASD: buttocks, groin, perineum  Last BM:  8/6 small amounts of stool via ileostomy  Height:   Ht Readings from  Last 1 Encounters:  07/21/18 5' 2.01" (1.575 m)    Weight:   Wt Readings from Last 1 Encounters:  07/21/18 200 lb 2.8 oz (90.8 kg)    Ideal Body Weight:  50 kg  BMI:  Body mass index is 36.6 kg/m.  Estimated Nutritional Needs:   Kcal:  1658 kcals   Protein:  110-130 g  Fluid:  >/= 1.7 L   Kerman Passey MS, RD, LDN, CNSC [phone] Pager  [phone] Weekend/On-Call Pager

## 2018-07-21 NOTE — Progress Notes (Signed)
[facility] Progress Note  1 Day Post-Op  Subjective: CC: leukocytosis and some degree of ileus Patient with slightly decreased stool output and had TF coming out of NGT overnight. TF restarted this AM at 20 cc/h. Patient still minimally responsive. Exploratory laparotomy negative for source of leukocytosis.   Objective: Vital signs in last 24 hours: Temp:  [97.8 F (36.6 C)-99.8 F (37.7 C)] 99.8 F (37.7 C) (08/06 0802) Pulse Rate:  [62-144] 96 (08/06 0722) Resp:  [18-28] 21 (08/06 0722) BP: (82-145)/(47-121) 91/58 (08/06 0722) SpO2:  [97 %-100 %] 100 % (08/06 0722) FiO2 (%):  [40 %] 40 % (08/06 0722) Weight:  [90.8 kg (200 lb 2.8 oz)] 90.8 kg (200 lb 2.8 oz) (08/06 0500) Last BM Date: 07/20/18  Intake/Output from previous day: 08/05 0701 - 08/06 0700 In: 2549.7 [I.V.:730.8; NG/GT:340; IV Piggyback:1478.8] Out: 3300 [Urine:1525; Emesis/NG output:1600; Drains:100; Stool:25; Blood:50] Intake/Output this shift: No intake/output data recorded.  PE: Gen:  Intubated, minimally responsive Card:  Atrial fibrillation with rate in the 130s Pulm:  On the ventilator Abd: Soft, mildly distended, bowel sounds hypoactive, midline wound with VAC present, some skin breakdown to left of dressing, stoma pink with small amount liquid brown stool in ostomy Ext: 4+ edema in feet bilaterally   Lab Results:  Recent Labs    07/20/18 0341 07/21/18 0421  WBC 38.8* 34.8*  HGB 8.1* 7.0*  HCT 26.0* PENDING  PLT 93* 108*   BMET Recent Labs    07/20/18 0341 07/21/18 0538  NA 150* 150*  K 2.9* 4.0  CL 113* 112*  CO2 30 29  GLUCOSE 179* 164*  BUN 47* 52*  CREATININE 0.72 0.81  CALCIUM 7.8* 7.9*   PT/INR Recent Labs    07/20/18 0341 07/21/18 0421  LABPROT 17.2* 19.3*  INR 1.41 1.64   CMP     Component Value Date/Time   NA 150 (H) 07/21/2018 0538   NA 138 06/22/2018 1350   NA 144 10/24/2014 1336   K 4.0 07/21/2018 0538   K 3.5 10/24/2014 1336   CL 112 (H)  07/21/2018 0538   CL 108 (H) 06/24/2013 0610   CL 103 10/22/2012 1343   CO2 29 07/21/2018 0538   CO2 31 (H) 10/24/2014 1336   GLUCOSE 164 (H) 07/21/2018 0538   GLUCOSE 116 10/24/2014 1336   GLUCOSE 153 (H) 10/22/2012 1343   BUN 52 (H) 07/21/2018 0538   BUN 19 06/22/2018 1350   BUN 14.9 10/24/2014 1336   CREATININE 0.81 07/21/2018 0538   CREATININE 0.77 10/22/2017 1007   CREATININE 0.8 10/24/2014 1336   CALCIUM 7.9 (L) 07/21/2018 0538   CALCIUM 10.7 (H) 10/24/2014 1336   PROT 3.8 (L) 07/20/2018 0341   PROT 5.9 (L) 06/22/2018 1350   PROT 7.6 10/24/2014 1336   ALBUMIN 1.6 (L) 07/20/2018 0341   ALBUMIN 3.8 06/22/2018 1350   ALBUMIN 4.1 10/24/2014 1336   AST 74 (H) 07/20/2018 0341   AST 21 10/24/2014 1336   ALT 60 (H) 07/20/2018 0341   ALT 31 10/24/2014 1336   ALKPHOS 505 (H) 07/20/2018 0341   ALKPHOS 138 10/24/2014 1336   BILITOT 8.4 (H) 07/20/2018 0341   BILITOT 1.1 06/22/2018 1350   BILITOT 0.44 10/24/2014 1336   GFRNONAA >60 07/21/2018 0538   GFRNONAA >60 06/24/2013 0610   GFRAA >60 07/21/2018 0538   GFRAA >60 06/24/2013 0610   Lipase     Component Value Date/Time   LIPASE 37 06/23/2018 0119  Studies/Results: Ct Abdomen Pelvis W Contrast  Addendum Date: 07/20/2018   ADDENDUM REPORT: 07/20/2018 14:19 ADDENDUM: Findings of venous gas discussed with Dr. Nelda Marseille. Findings of DVT called to the patient's nurse, Maudie Mercury. Electronically Signed   By: Dorise Bullion III M.D   On: 07/20/2018 14:19   Result Date: 07/20/2018 CLINICAL DATA:  Status post colectomy and ileostomy after bowel perforation. Jaundice and leukocytosis. EXAM: CT ABDOMEN AND PELVIS WITH CONTRAST TECHNIQUE: Multidetector CT imaging of the abdomen and pelvis was performed using the standard protocol following bolus administration of intravenous contrast. CONTRAST:  114mL OMNIPAQUE IOHEXOL 300 MG/ML  SOLN COMPARISON:  None. FINDINGS: Lower chest: Bilateral pleural effusions with underlying pulmonary opacities  and atelectasis. The effusions and atelectasis have worsened in the interval. No other acute abnormalities in the lower chest. Hepatobiliary: Branching air seen in the peripheral liver, particularly the left hepatic lobe. Branching air is also seen in the caudate. There is air in the superior mesenteric vein as seen on coronal image 70. There is also air in multiple small bowel draining veins. No liver masses. The patient is status post cholecystectomy. Portal vein is patent. Pancreas: Unremarkable. No pancreatic ductal dilatation or surrounding inflammatory changes. Spleen: Normal in size without focal abnormality. Adrenals/Urinary Tract: Multiple small cysts in the kidneys without obstruction. The ureters are normal with no stones. The bladder is unremarkable but poorly evaluated due to a Foley catheter. Adrenal glands are normal. Stomach/Bowel: A feeding tube terminates just beyond the ligament of Treitz. There appears to be marked wall thickening in the fundus of the stomach well seen on coronal image 101. An NG tube terminates in the gastric fundus. The remainder of the stomach is unremarkable. There is diffuse dilatation of the small bowel extending from the stomach to the level of the ileostomy. Pneumatosis is identified. Fecal material seen within the small bowel. There is air in multiple small bowel draining veins. Colon is been removed. Vascular/Lymphatic: Mild atherosclerotic changes in the abdominal aorta. Suggested filling defect in the right femoral vein as seen on series 3, image 100 and coronal image 80. No adenopathy. Reproductive: Uterus and bilateral adnexa are unremarkable. Other: There is air in small bowel draining veins. No definitive free air identified on this study. Musculoskeletal: No acute or significant osseous findings. IMPRESSION: 1. Small bowel pneumatosis. Air within draining veins of the small bowel, the superior mesenteric vein, and intrahepatic branches of the portal vein. 2.  Marked gastric wall thickening in the fundus of the stomach. 3. Suspected right femoral DVT. 4. Marked ascites. 5. Increasing bilateral effusions with underlying opacities. 6. Mild atherosclerotic changes in the abdominal aorta. Findings called to the patient's doctor. Electronically Signed: By: Dorise Bullion III M.D On: 07/20/2018 14:00   Dg Chest Port 1 View  Result Date: 07/21/2018 CLINICAL DATA:  Respiratory failure and shortness of Breath EXAM: PORTABLE CHEST 1 VIEW COMPARISON:  07/20/18 FINDINGS: Cardiac shadow is mildly enlarged. Endotracheal tube, nasogastric catheter and feeding catheter are again seen and stable. Right jugular and right subclavian central lines are seen and stable. The overall inspiratory effort is stable. Changes of bilateral pleural effusions and bibasilar atelectasis are again seen and stable. No new focal abnormality is seen. IMPRESSION: No significant change from the prior exam. Electronically Signed   By: Inez Catalina M.D.   On: 07/21/2018 07:06   Dg Chest Port 1 View  Result Date: 07/20/2018 CLINICAL DATA:  Hypoxia EXAM: PORTABLE CHEST 1 VIEW COMPARISON:  July 18, 2018 FINDINGS: Endotracheal tube  tip is 6 mm above the carina. Central catheter tips are in the superior vena cava. Feeding tube and nasogastric tube tips are below the diaphragm. No pneumothorax. There are pleural effusions bilaterally with bibasilar atelectasis. Heart is upper normal in size with pulmonary vascularity normal. No adenopathy. There are surgical clips in left axilla. IMPRESSION: Tube and catheter positions as described without pneumothorax. Note that the endotracheal tube tip is close to the carina. Advise withdrawing endotracheal tube 2-3 cm. Bilateral pleural effusions with bibasilar atelectasis. No frank consolidation. Stable cardiac silhouette. Electronically Signed   By: Lowella Grip III M.D.   On: 07/20/2018 07:14   [place] Ekg Site Rite  Result Date: 07/19/2018 If Site Rite image not  attached, placement could not be confirmed due to current cardiac rhythm.   Anti-infectives: Anti-infectives (From admission, onward)   Start     Dose/Rate Route Frequency Ordered Stop   07/17/18 1600  rifaximin (XIFAXAN) tablet 550 mg     550 mg Oral 3 times daily 07/17/18 1254     07/14/18 0600  ceFAZolin (ANCEF) IVPB 2g/100 mL premix     2 g 200 mL/hr over 30 Minutes Intravenous On call to O.R. 07/13/18 2234 07/14/18 1052   07/13/18 2100  fluconazole (DIFLUCAN) IVPB 200 mg  Status:  Discontinued     200 mg 100 mL/hr over 60 Minutes Intravenous Every 24 hours 07/12/18 1958 07/15/18 1224   07/13/18 1800  vancomycin (VANCOCIN) IVPB 1000 mg/200 mL premix  Status:  Discontinued     1,000 mg 200 mL/hr over 60 Minutes Intravenous Every 24 hours 07/12/18 1549 07/15/18 1224   07/13/18 0100  piperacillin-tazobactam (ZOSYN) IVPB 3.375 g     3.375 g 12.5 mL/hr over 240 Minutes Intravenous Every 8 hours 07/12/18 1549     07/12/18 2100  fluconazole (DIFLUCAN) IVPB 400 mg     400 mg 100 mL/hr over 120 Minutes Intravenous  Once 07/12/18 1958 07/12/18 2306   07/12/18 1600  vancomycin (VANCOCIN) 1,500 mg in sodium chloride 0.9 % 500 mL IVPB     1,500 mg 250 mL/hr over 120 Minutes Intravenous STAT 07/12/18 1549 07/12/18 1829   07/12/18 1600  piperacillin-tazobactam (ZOSYN) IVPB 3.375 g     3.375 g 100 mL/hr over 30 Minutes Intravenous STAT 07/12/18 1549 07/12/18 1701   07/12/18 0800  ampicillin-sulbactam (UNASYN) 1.5 g in sodium chloride 0.9 % 100 mL IVPB  Status:  Discontinued     1.5 g 200 mL/hr over 30 Minutes Intravenous Every 6 hours 07/12/18 0715 07/12/18 1538   07/04/18 1000  rifaximin (XIFAXAN) tablet 550 mg  Status:  Discontinued     550 mg Per Tube 3 times daily 07/04/18 0925 07/12/18 1647   06/30/18 2200  vancomycin (VANCOCIN) IVPB 1000 mg/200 mL premix  Status:  Discontinued     1,000 mg 200 mL/hr over 60 Minutes Intravenous Every 24 hours 06/30/18 2008 07/04/18 0925   06/30/18 2200   piperacillin-tazobactam (ZOSYN) IVPB 3.375 g  Status:  Discontinued     3.375 g 12.5 mL/hr over 240 Minutes Intravenous Every 8 hours 06/30/18 2011 07/06/18 1149   06/30/18 0000  vancomycin variable dose per unstable renal function (pharmacist dosing)  Status:  Discontinued      Does not apply See admin instructions 06/29/18 1229 07/03/18 0747   06/29/18 1300  piperacillin-tazobactam (ZOSYN) IVPB 2.25 g  Status:  Discontinued     2.25 g 100 mL/hr over 30 Minutes Intravenous Every 8 hours 06/29/18 1229 06/30/18 2011  06/29/18 1300  vancomycin (VANCOCIN) 1,250 mg in sodium chloride 0.9 % 250 mL IVPB     1,250 mg 166.7 mL/hr over 90 Minutes Intravenous  Once 06/29/18 1229 06/29/18 2234       Assessment/Plan Brandi [name] cirrhosis with hepatic encephalopathy New onset atrial fibrillation with rapid ventricular rate New onset cardiomyopathy with EF 30 to 35% NewRight frontal operculum infarct History of seizure disorder Hypertension Type 2 diabetes well controlled History of schizophrenia  Septic shock due to perforated descending colon S/p ex-lap with ileocecectomy and partial descending colectomy left in discontinuity with abdominal wound VAC placement 07/12/18 Dr. Barry Dienes - POD#9 S/p ex-lap partial colectomy with closure of abdomen and creation of end ileostomy 07/14/18 Dr. Brantley Stage - POD#7 S/p exploratory laparotomy with closure and VAC placement 07/20/18 Dr. Rosendo Gros - POD#1 -Hgb 7.0, transfuse 1 unit PRBC - 1600 out of NGT in last 24 h - continue on LIWS - only 25 cc stool out - decrease TF and continue TPN, if patient having TF come out of NGT again needs TF held - start erythromycin for motility and consider reconsulting GI - continue VAC changes M/W/F Leukocytosis - WBC 34.8 from 38.8 yesterday, pt afebrile  - CT abdomen/pelvis yesterday showed concern for pneumatosis but ex-lap negative for bowel ischemia or intraabdominal source   FEN: IVF,TPN, TF at 20 cc/h ; NGT to LIWS; can give  lactulose for hepatic encephalopathy  VTE: SCDs ID: current abx - Zosyn 7/29>>, rifaximin 8/2>>    LOS: 28 days    Brigid Re , [facility] 07/21/2018, 8:44 AM Pager: [phone] Consults: [phone] Mon-Fri 7:00 am-4:30 pm Sat-Sun 7:00 am-11:30 am

## 2018-07-22 ENCOUNTER — Inpatient Hospital Stay (HOSPITAL_COMMUNITY): Payer: Medicare Other

## 2018-07-22 DIAGNOSIS — A419 Sepsis, unspecified organism: Secondary | ICD-10-CM

## 2018-07-22 DIAGNOSIS — I5043 Acute on chronic combined systolic (congestive) and diastolic (congestive) heart failure: Secondary | ICD-10-CM

## 2018-07-22 DIAGNOSIS — M7989 Other specified soft tissue disorders: Secondary | ICD-10-CM

## 2018-07-22 DIAGNOSIS — R6521 Severe sepsis with septic shock: Secondary | ICD-10-CM

## 2018-07-22 DIAGNOSIS — L988 Other specified disorders of the skin and subcutaneous tissue: Secondary | ICD-10-CM

## 2018-07-22 DIAGNOSIS — I481 Persistent atrial fibrillation: Secondary | ICD-10-CM

## 2018-07-22 DIAGNOSIS — Z9911 Dependence on respirator [ventilator] status: Secondary | ICD-10-CM

## 2018-07-22 LAB — FIBRINOGEN: Fibrinogen: 438 mg/dL (ref 210–475)

## 2018-07-22 LAB — BASIC METABOLIC PANEL
Anion gap: 9 (ref 5–15)
BUN: 58 mg/dL — ABNORMAL HIGH (ref 8–23)
CALCIUM: 7.9 mg/dL — AB (ref 8.9–10.3)
CO2: 29 mmol/L (ref 22–32)
Chloride: 109 mmol/L (ref 98–111)
Creatinine, Ser: 0.87 mg/dL (ref 0.44–1.00)
GFR calc Af Amer: >60 mL/min (ref >60)
GFR calc non Af Amer: >60 mL/min (ref >60)
GLUCOSE: 160 mg/dL — AB (ref 70–99)
Potassium: 3.1 mmol/L — ABNORMAL LOW (ref 3.5–5.1)
Sodium: 147 mmol/L — ABNORMAL HIGH (ref 135–145)

## 2018-07-22 LAB — GLUCOSE, CAPILLARY
GLUCOSE-CAPILLARY: 213 mg/dL — AB (ref 70–99)
GLUCOSE-CAPILLARY: 118 mg/dL — AB (ref 70–99)
Glucose-Capillary: 173 mg/dL — ABNORMAL HIGH (ref 70–99)
Glucose-Capillary: 146 mg/dL — ABNORMAL HIGH (ref 70–99)
Glucose-Capillary: 151 mg/dL — ABNORMAL HIGH (ref 70–99)
Glucose-Capillary: 148 mg/dL — ABNORMAL HIGH (ref 70–99)
Glucose-Capillary: 165 mg/dL — ABNORMAL HIGH (ref 70–99)

## 2018-07-22 LAB — BPAM RBC
BLOOD PRODUCT EXPIRATION DATE: 201909062359
ISSUE DATE / TIME: 201908061056
UNIT TYPE AND RH: 5100

## 2018-07-22 LAB — TYPE AND SCREEN
ABO/RH(D): O POS
ANTIBODY SCREEN: NEGATIVE
UNIT DIVISION: 0

## 2018-07-22 LAB — CBC
HEMATOCRIT: 28.6 % — AB (ref 36.0–46.0)
HEMOGLOBIN: 9 g/dL — AB (ref 12.0–15.0)
MCH: 26.5 pg (ref 26.0–34.0)
MCHC: 31.5 g/dL (ref 30.0–36.0)
MCV: 84.1 fL (ref 78.0–100.0)
Platelets: 109 10*3/uL — ABNORMAL LOW (ref 150–400)
RBC: 3.4 MIL/uL — ABNORMAL LOW (ref 3.87–5.11)
RDW: 28.7 % — ABNORMAL HIGH (ref 11.5–15.5)
WBC: 32.8 10*3/uL — AB (ref 4.0–10.5)

## 2018-07-22 LAB — PHOSPHORUS: Phosphorus: 3.3 mg/dL (ref 2.5–4.6)

## 2018-07-22 LAB — HEPARIN LEVEL (UNFRACTIONATED)

## 2018-07-22 LAB — MAGNESIUM: Magnesium: 2 mg/dL (ref 1.7–2.4)

## 2018-07-22 LAB — PROTIME-INR
INR: 1.41
Prothrombin Time: 17.2 seconds — ABNORMAL HIGH (ref 11.4–15.2)

## 2018-07-22 MED ORDER — POTASSIUM CHLORIDE 20 MEQ/15ML (10%) PO SOLN
30.0000 meq | ORAL | Status: AC
  Administered 2018-07-22 (×2): 30 meq
  Filled 2018-07-22 (×2): qty 30

## 2018-07-22 MED ORDER — ZINC TRACE METAL 1 MG/ML IV SOLN
INTRAVENOUS | Status: DC
  Administered 2018-07-22: 19:00:00 via INTRAVENOUS
  Filled 2018-07-22: qty 1125

## 2018-07-22 MED ORDER — HEPARIN (PORCINE) IN NACL 100-0.45 UNIT/ML-% IJ SOLN: 1100.0000 [IU]/h | INTRAMUSCULAR | Status: DC

## 2018-07-22 MED ORDER — STERILE WATER FOR INJECTION IJ SOLN
INTRAMUSCULAR | Status: AC
  Administered 2018-07-22: 10 mL
  Filled 2018-07-22: qty 10

## 2018-07-22 MED ORDER — SODIUM CHLORIDE 0.9 % IV SOLN
INTRAVENOUS | Status: DC | PRN
  Administered 2018-07-22: 10:00:00 via INTRAVENOUS

## 2018-07-22 MED ORDER — HEPARIN (PORCINE) IN NACL 100-0.45 UNIT/ML-% IJ SOLN
1300.0000 [IU]/h | INTRAMUSCULAR | Status: DC
  Administered 2018-07-22: 1100 [IU]/h via INTRAVENOUS
  Administered 2018-07-23: 1300 [IU]/h via INTRAVENOUS
  Filled 2018-07-22: qty 250

## 2018-07-22 NOTE — Progress Notes (Signed)
PHARMACY - ADULT TOTAL PARENTERAL NUTRITION CONSULT NOTE   Pharmacy Consult: TPN Indication: Post-operative ileus  Patient Measurements: Height: 5' 2.01" (157.5 cm) Weight: 201 lb 4.5 oz (91.3 kg) IBW/kg (Calculated) : 50.12 TPN AdjBW (KG): 60.3 Body mass index is 36.81 kg/m. 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 On 7/15, patient's clinical status worsened and increased confusion and multiorgan failure including respiratory failure requiring intubation. Patient improved and was extubated 7/25. However, patient had subsequent cardiac arrest on 7/28 and found to have perforation of descending colon and near perforation of cecum requiring ileocecectomy and partial colectomy with discontinuity and open abdomen. She returned to the OR on 7/30 with creation of end ileostomy and closure of abdomen. Patient has been NPO >7 days and with limited nutrition since admission (22 days); therefore, is at high risk for refeeding.  GI: prealbumin improved to 8.4, LBM 8/7, NG O/P 653m (down), drain O/P 2267m(up), ileostomy O/P 23576mup).  8/5 CT showed gas and pneumotosis, s/p ex-lap.  Mult attempt to titrate TF to goal and failed.  PPI PT, erythromycin Endo: no hx DM - CBGs controlled Insulin requirements in the past 24 hours: 18 units + 40 units in TPN Lytes: Na down to 147, CL normalized (free water 100m68mh), K 3.1 (60mE28mdered), others WNL Renal: SCr 0.81 stable, BUN 52 - UOP 0.6 ml/kg/hr, +19.8L Pulm: FiO2 40% - getting trach today Cards: MAP 70-80s, EF 30-35%, CVP 7, Afib with HR 80-90s on IV metoprolol, diltiazem gtt, back on phenylephrine Hepatobil: AST/ALT 74/60, alk phos down 505, tbili down 8.4. TG 197 Neuro: HE - lactulose/rifaximin.  Sedated on Fentanyl. Sz history on Vimpat + Keppra.  GCS 9, CPOT 0-4, RASS -1 ID: Zosyn for IAI - Tmax 100.3, WBC down 32.8, LA  down 1.8 TPN Access: PICC placed 07/10/18 TPN start date: 07/16/18  Nutritional Goals (per RD rec on 8/5): 1658 kCal, 110-130gm protein and >/= 1.7L fluid per day  Current Nutrition:  TPN    Plan:   Increase TPN to 75 ml/hr given frequent TF interruptions.  TF to resume post trach today. TPN will provide 113g AA and 324g CHO for a total of 1552 kCal, meeting >90% of needs Hold 20% ILE for first 7 days for ICU patients per ASPEN guidelines (start date 8/8) Electrolytes in TPN: remove Na on 8/5, increase K slightly, max acetate - fluctuation with slightly rate increase Daily multivitamin in TPN Remove trace elements d/t elevated tbili - add back zinc 5mg, 8momium 10 mcg, and selenium 60 mcg Continue resistant SSI Q4H + 40 units regular insulin in TPN F/U AM labs, CBGs, volume status   Brandi [name] D. Brandi [name], Brandi [name], BCPS, BCCCP [id number], 8:06 AM

## 2018-07-22 NOTE — Progress Notes (Signed)
Preliminary notes--Right upper extremity venous duplex exam completed. Acute positive thrombosis seen at medial and distal segments of right subclavian vein, right Brachial veins also thrombosed..  Very limited study due to patient critical condition and PICC and IV on site.  Superficial veins not be able to seen at today's exam. Left Subclavian vein questionable acute thrombosed.  Result notified RN. Hongying Arsen Mangione (RDMS RVT) 07/22/18 6:30 PM

## 2018-07-22 NOTE — Progress Notes (Signed)
PULMONARY / CRITICAL CARE MEDICINE   Name: MERLIN EGE MRN: [id number] DOB: 1956-03-31    ADMISSION DATE:  06/23/2018 CONSULTATION DATE: 06/29/2018  REFERRING MD: Dr. Bonner Puna  CHIEF COMPLAINT: Altered mental status  HISTORY OF PRESENT ILLNESS:  63 y/o F who presented to [facility] on 7/9 with abdominal pain, nausea, vomiting, decreased oral intake and weakness.    She was reportedly evaluated by her PCP prior to admit with elevated LFT's (AST 302, ALT 611, AP 380).  Follow up CT of the abdomen demonstrated prominent caudate lobe and surface irregularity of the liver.   In the ER, US of the abdomen confirmed the CT findings of the liver.  She was noted to be lethargic and have asterixis on exam.  Ammonia 22 (7/10) > 38 (7/12).  The patient had AFwRVR and was treated with cardizem.  She was admitted for further evaluation.  The patient was seen by Cardiology and transitioned to metoprolol for rate control due to decreased LVEF (30-35%). Labs showed a microcytic anemia with ferritin of 9.  GI was consulted for evaluation with recommendations for lactulose and eventual endoscopic evaluation.  She had ongoing encephalopathy and a CT of the head was obtained which showed a right frontal operculum CVA.  Neurology evaluated the patient and felt the stroke was likely related to AF but not the cause of her mental status.  She was recommended anticoagulation and passed a swallow evaluation converting from heparin to eliquis.  On 7/15, her confusion, tardive dyskinesia symptoms and lethargy worsened as well as her LFT's (AST 3942, ALT 1976, ALK Phos 404), renal function and WBC.  PCCM consulted for evaluation.  Anticoagulation stopped due to shock liver.  PSY meds were held due to severe tardive dyskinesia and liver dysfunction.  She had prolonged intubation due to poor mental status.  The patient was extubated on 7/25 without difficulty.  She was transitioned to [facility] on 7/26.  She developed agitated delirium overnight  7/26 and precedex was initiated by [facility].  PCCM called back 7/28 to assess patient as she had coffee ground emesis overnight, concern for aspiration, hypotension that responded to 544m saline bolus x2.  As day progressed on 7/28, she had progressive abdominal pain.  CT of the abd/pelvis demonstrated bowel perforation.  She was intubated, had brief arrest post intubation (9 min) and was taken to the OR.  Returned to ICU on epi, neo, vaso, levo, bicarb.  Pressor need improved overnight into 7/29.   SUBJECTIVE:  No events   VITAL SIGNS: BP (!) 95/57 (BP Location: Left Wrist)   Pulse 77   Temp 99.6 F (37.6 C) (Axillary)   Resp 18   Ht 5' 2.01" (1.575 m)   Wt 91.3 kg (201 lb 4.5 oz)   SpO2 99%   BMI 36.81 kg/m   HEMODYNAMICS: CVP:  [5 mmHg-8 mmHg] 8 mmHg  VENTILATOR SETTINGS: Vent Mode: PRVC FiO2 (%):  [4 %-40 %] 40 % Set Rate:  [18 bmp] 18 bmp Vt Set:  [460 mL] 460 mL PEEP:  [5 cmH20] 5 cmH20 Pressure Support:  [5 cmH20] 5 cmH20 Plateau Pressure:  [12 cmH20-15 cmH20] 14 cmH20  INTAKE / OUTPUT:  Intake/Output Summary (Last 24 hours) at 07/22/2018 1024 Last data filed at 07/22/2018 0900 Gross per 24 hour  Intake 3989.93 ml  Output 2300 ml  Net 1689.93 ml    PHYSICAL EXAMINATION: General:  Critically ill appearing female, NAD HEENT: [place]/AT, pupils are pin point, EOM-spontaneous and MMM.  Scleral icterus Neuro:  Opens eyes but not following commands CV: IRIR, Nl S1/S2 and -M/R/G PULM: Coarse BS diffusely GI: Midline abd wound vac with serous/orange minimal drainage; left ileostomy +gas/ stool, +BS Extremities: cool/dry, anasarca, RUE noted with swelling and bruising around AC/ PICC site, no redness Skin: yellow, no rashes  LABS:  BMET Recent Labs  Lab 07/20/18 0341 07/21/18 0538 07/22/18 0329  NA 150* 150* 147*  K 2.9* 4.0 3.1*  CL 113* 112* 109  CO2 _0 BUN 47* 52* 58*  CREATININE 0.72 0.81 0.87  GLUCOSE 179* 164* 160*    Electrolytes Recent Labs  Lab  07/20/18 0341 07/21/18 0538 07/22/18 0329  CALCIUM 7.8* 7.9* 7.9*  MG 1.6* 1.9 2.0  PHOS 2.4* 3.5 3.3    CBC Recent Labs  Lab 07/20/18 0341 07/21/18 0421 07/21/18 1341 07/22/18 0329  WBC 38.8* 34.8*  --  32.8*  HGB 8.1* 7.0* 9.5* 9.0*  HCT 26.0* 23.8* 30.5* 28.6*  PLT 93* 108*  --  109*    Coag's Recent Labs  Lab 07/20/18 0341 07/21/18 0421 07/22/18 0329  INR 1.41 1.64 1.41    Sepsis Markers Recent Labs  Lab 07/20/18 1432 07/20/18 1756 07/21/18 0538  LATICACIDVEN 1.9 2.0* 1.8    ABG No results for input(s): PHART, PCO2ART, PO2ART in the last 168 hours.  Liver Enzymes Recent Labs  Lab 07/16/18 0910 07/18/18 0930 07/20/18 0341  AST 89* 72* 74*  ALT 51* 50* 60*  ALKPHOS 352* 710* 505*  BILITOT 9.5* 10.1* 8.4*  ALBUMIN 2.2* 1.6* 1.6*    Cardiac Enzymes No results for input(s): TROPONINI, PROBNP in the last 168 hours.  Glucose Recent Labs  Lab 07/21/18 1150 07/21/18 1553 07/21/18 1942 07/21/18 2321 07/22/18 0323 07/22/18 0729  GLUCAP 134* 119* 160* 160* 173* 146*    Imaging  STUDIES:  [place] ABD 7/6 >> possible cirrhosis, gallbladder surgically absent CT Head 7/9 >> atrophy with small vessel chronic ischemic changes of deep cerebral white matter CT Renal Study 7/9 >> no acute findings, bilateral non-obstructing renal stones ECHO 7/9 >> LV mildly dilated, wall thickness was increased in a pattern of moderate LVH, LVEF 30-35%, severe hypokinesis of the anteroseptal, anterior, inferoseptal & apical myocardium, mild to moderate MV regurgitation, LA severely dilated, RV moderately dilated, RA mod-severely dilated, PA pressure 83mHg, trivial pericardial effusion. CTA Head, Neck 7/12 >> minimal calcified plaque at the carotid bifurcation regions but no stenosis or irregularity, no large vessel occlusions, unable to specifically identify the missing branch vessel responsible for the right posterior frontal infarction but this is presumably an occluded M3  or distal branch CT Head w/o 7/12 >> the small right middle cerebral artery distribution, posterolateral frontal lobe infarct has evolved since the prior CT, it is now seen as a well defined area of hypoattenuation, no new areas of infarct, no ICH CT head 7/15 >> Redemonstration of right posterior frontal lobe infarct. No acute intracranial hemorrhage or new infarct identified.  Atrophy with chronic small vessel ischemia. CT abd/pelvis 7/18 > Diffuse dilation of the colon with large amount of ascending colonic stool.  CT ABD/Pelvis 7/28 >> free air in the abd consistent with bowel rupture, unable to identify source, bilateral pleural effusions (stable), non-obstructive kidney stones EEG 8/2 >no seizure act  CT abd/ pelvis 8/5 >> 1. Small bowel pneumatosis. Air within draining veins of the small bowel, the superior mesenteric vein, and intrahepatic branches of the portal vein. 2. Marked gastric wall thickening in the fundus of the stomach. 3.  Suspected right femoral DVT. 4. Marked ascites. 5. Increasing bilateral effusions with underlying opacities. 6. Mild atherosclerotic changes in the abdominal aorta.  CULTURES: BCx2 7/10 >> negative  Cdiff 7/20 >> negative  Sputum 7/20 >> negative Sputum 7/29>>Kleb Pne SS zoysn BC 7/28 >neg  BC 8/4 >> 8/5 MRSA PCR >> neg  ANTIBIOTICS: Vanco 7/15 >> 7/21 Zoysn 7/15 >> 7/22 Unasyn 7/28 >> 7/28 Diflucan 7/28 >> 7/31 Zosyn 7/28 >>  Vanco 7/28 >>7/31  SIGNIFICANT EVENTS: 7/09  Admit  7/15  PCCM consulted with AMS, acute liver failure 7/19  Had been receiving fluid challenges for hypotension. 7/20   Early in the a.m. hours drop blood pressure.  Placed on pressors.  Non-anion gap acidosis noted.  Cortisol 29.2 CBC remains elevated blood cell count remains elevated.  7/21  no significant fever.  White cell count cut in half.  Chest x-ray looks improved.  We resumed lactulose and rifaximin yesterday, also resumed spironolactone.  Still obtunded  7/22   Free water increased 7/23  800 ml in flexiseal overnight.  Off home PSY meds. Mg/K low.  Na 153 on free water. Continues to have tardive dyskinesia.  7/24 Tmax 100.2.  I/O- positive 8.6L for admit / 2125 of stool out 7/23. 7/26  extubated on 7/25. Remains encephalopathic. NA still elevated  7/28  PCCM called back for hypotension on precedex, coffee ground emesis. Concern for aspiration, hypotension that responded to 556m saline bolus x2.  TF held overnight with nausea/abd pain.  NGT placed due to vomiting. ABD pain increased > CT abd with bowel perforation > to OR for resection/wash out Wound vac, /ileostomy, remains intubated  LINES/TUBES: ETT 7/15 >> 7/25 7/16 Foley >> R DL PICC 7/26 >> ETT 7/28 >>  L Rad Aline 7/28 >> out R IJ dual lumen 7/28 >>  DISCUSSION: 62y/o F admitted with weakness, decreased intake and lethargy.  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  She developed AMS > small infarct thought not related to AMS.  Recommended for anticoagulation by Cardiology/Neurology, was transitioned from heparin to eliquis.  The patient deteriorated on 7/15 with a rise in LFT's, AKI, confusion/AMS, worsening of tardive dyskinesia type symptoms.  Transferred to ICU.  ICU course significant for slow improvement of mental status with periods of agitation where suspect to be related to being off her maintenance PSY regimen (due to liver dysfunction). She has been seen by PSY (see recommendations).  Her electrolytes have difficult to manage in the setting of high stool output & poor nutritional status. She has been on free water and dextrose via IV. She pulled her flexiseal out multiple times overnight 7/28.  ABD pain worsened 7/28 during the day > CT ABD with perforation, to OR with Dr. BBarry Dienes  07/15/2018 to wean her altered mental status is a block to any attempt at extubation. 07/16/2018 TPN to start Patient to less agitated on 07/17/2008 8/4 TF restarted, TPN weaning  8/5 continued A.  fib with RVR  remains on Cardizem drip HR up and down; Low-grade fevers, white blood cell count trending up 38,000;  On tube feeds at 30cc/hr weaning TPN; NGT with bilious output (950 cc /24hr); Weaning today 5/5 , HR tr up.  Taken for emergent Exp lap yesterday after CT abd/pelvis concerning for ischemic bowel, portal venous gas and pneumatosis, found negative for ischemic bowel, noted ascites    ASSESSMENT / PLAN:  Active issues   Acute Hypoxic Respiratory Failure - in setting of bowel perforation, shock  Bilateral Pleural Effusions - suspect in setting of liver disease, HF, AKI Klebsiella pneumoniae in sputum P: Maintain on full vent support Trach to be done today at 2PM VAP protocol  Day 10/x zosyn (ongoing for possible intra-abd)  Acute metabolic & hepatic encephalopathy / acute delirium - improved CVA - Right Frontal Operculum infarct - felt related to AF/embolic but not all cause of mental status Right-sided weakness Seizure disorder-EEG 8/2 no seizure act  Schizophrenia Tardive Dyskinesia - improved 7/28  P: Cont Keppra/Vimpat  After trach will d/c fentanyl  HFrEF - LVEF 30% AF with RVR>recurrent 8/4 >cardizem restarted -rate improved  Hypotension - 7/28, suspect related to intravascular volume depletion, sedation>resolved off pressors  Hx HTN   P: Hold heparin for trach today then restart Cont lopressor 48m q 6 (not been getting consistent doses secondary to fluctuating BP) Continue cardizem gtt at 10 mg/hr Neo for MAP > 65 at 140 mcg Hold diureses while hemodynamically unstable  AGMA - in setting of bowel perforation, shock -resolved  Fluid and Electrolyte imbalance: Hypernatremia / Hyperchloremia, Hypokalemia  Hypernatremia  Hypomagnesium  Recent Labs  Lab 07/20/18 0341 07/21/18 0538 07/22/18 0329  K 2.9* 4.0 3.1*   P: Replete electrolytes as indicated (K today) TPN per pharm D/C free water BMET in AM  [place] Cirrhosis with Hepatic Encephalopathy  -  unclear in what precipitated change in events LFTs/ decompensation 7/15 - 7/15 UKorealiver w/doppler with no evidence of portal venous thrombosis  -LFTs improved 8/4 [place]ia 20 P: Intermittently  LFTs Cont lactulose 20g BID, rifaximin TID  Perforated Viscus  At Risk Abscess Development  - mid descending colon perforation s/p partial colectomy , ileostomy  - ex lap 8/5 neg for ischemic bowel, +ascites P: Per general surgery Wound vac  Defer zosyn stop date to surgery After trach restart TF at 20 mg/hr per surgery and continue TPN  Leukocytosis, questionable sepsis however etiology unclear.   Klebsiella pneumoniae in sputum- adequately treated with zosyn  P: Defer zosyn to surgery Repeat MRSA PCR neg Lines in place since - R IJ 7/28 and R PICC 7/26, follow blood cultures closely  Coagulopathy Thrombocytopenia plts 32->44>73>81>70>93 Anemia Recent Labs    07/21/18 1341 07/22/18 0329  HGB 9.5* 9.0*  plts 32 Lab Results  Component Value Date   INR 1.41 07/22/2018   INR 1.64 07/21/2018   INR 1.41 07/20/2018   RUE swelling  P: Trend Monitor for bleeding  SCDs only  Doppler study to r/o DVT in RUE  Coffee Ground Emesis - resolved Vomiting-resolved  P: Continue protonix  NG to IWS   DM 2 CBG (last 3)  Recent Labs    07/21/18 2321 07/22/18 0323 07/22/18 0729  GLUCAP 160* 173* 146*    P: Continue sliding-scale insulin adjusted per pharm in TPN  Hx Breast Cancer P: Follow-up as outpatient  DVT prophylaxis: SCD's SUP: PPI  Diet: NPO, TPN to started 07/16/2018, tube feeds started 07/17/2017 Activity: BR Disposition : ICU, critically ill   FAMILY  - Updates: Per conversation with husband will place trach today and PEG when surgery deems able.  The patient is critically ill with multiple organ systems failure and requires high complexity decision making for assessment and support, frequent evaluation and titration of therapies, application of advanced  monitoring technologies and extensive interpretation of multiple databases.   Critical Care Time devoted to patient care services described in this note is  32  Minutes. This time reflects time of care of this signee  Dr [name]. This critical care time does not reflect procedure time, or teaching time or supervisory time of PA/NP/Med student/Med Resident etc but could involve care discussion time.  Rush Farmer, M.D. [facility] Pulmonary/Critical Care Medicine. Pager: [phone]. After hours pager: [phone].

## 2018-07-22 NOTE — Consult Note (Signed)
[place] Nurse wound consult note Reason for Consult: Midline abdominal wound Vac change.  Full thickness post-op wound; 24X4X4.5 cm Red woundbed with yellow adipose interspersed throughout.  Mod amt yellow drainage in the cannister, no odor Pt is on continuous IV for pain and tolerated with minimal amt discomfort. Applied 1 piece black foam to 176mm cont suction.  There are red partial thickness wounds from previous medical adhesive related skin injuries surrounding the wounds, did not cover with drape and left open to air.  [place] Nurse ostomy consultnote Stoma type/location:RLQ ileostomy, flush Stomal assessment/size:1 1/4 inches, flush with skin level, dark red. Small amt liquid brown stool in pouch.There are red partial thickness wounds from previous medical adhesive related skin injuries surrounding the wounds, did not cover and left open to air. Ostomy pouching:Applied one piece convex pouch with barrier ring to attempt to maintain a seal. Education provided:Pt is sedated and intubated and there are no family members at the bedside. Enrolled patient in [place] program: No WOC team will plan to change Vac and ostomy pouch Q M/W/F and begin teaching sessions when stable and out of ICU. Julien Girt MSN, RN, [place], [place], [place]

## 2018-07-22 NOTE — Progress Notes (Addendum)
Pt's R foot cold, slightly mottled, and cap refill >3 secs.  L foot warm, pink/pale, and cap refill <3 seconds.  Both B LE have palpable and dopplerable pulses.  Dr. Lucile Shutters notified, orders received.  Will continue to monitor pt.  Dr. Lucile Shutters also notified of Doppler report of R UE.  Pt with thrombosis in medial and distal segments of R [place] vein, and right brachial veins.

## 2018-07-22 NOTE — Procedures (Signed)
Bedside Tracheostomy Insertion Procedure Note   Patient Details:   Name: Brandi [name] DOB: 26-Aug-1956 MRN: [id number]  Procedure: Tracheostomy  Pre Procedure Assessment: ET Tube Size: 7.5 ET Tube secured at lip (cm): 21 Bite block in place: No Breath Sounds: Clear  Post Procedure Assessment: BP (!) 89/64   Pulse (!) 130   Temp (!) 100.6 F (38.1 C) (Axillary) Comment: [place], RN notified  Resp (!) 24   Ht 5' 2.01" (1.575 m)   Wt 198 lb 3.1 oz (89.9 kg)   SpO2 99%   BMI 36.24 kg/m  O2 sats: stable throughout Complications: No apparent complications Patient did tolerate procedure well Tracheostomy Brand:Shiley Tracheostomy Style:Cuffed Tracheostomy Size: 6.0 Tracheostomy Secured GYL:[id number] Tracheostomy Placement Confirmation:Trach cuff visualized and in place    Phillis Knack [facility] 07/22/2018, 3:04 PM

## 2018-07-22 NOTE — Progress Notes (Signed)
[facility] [place]  Patient name: Brandi [name] MRN: [id number] DOB: Oct 12, 1956 Sex: female   REQUESTING PROVIDER:    ICU team   REASON FOR CONSULT:    Mottled right leg  HISTORY OF PRESENT ILLNESS:   Brandi [name] is a 62 y.o. female, who was admitted in early July with nausea, poor oral intake and palpitations.  She was diagnosed with new onset atrial fibrillation.  During hospitalization she suffered from a stroke.  She also developed septic shock from perforated viscus.  She has been on anticoagulation for atrial fibrillation as well as DVT.  Earlier today she was noted to have an acute change in the appearance of her right leg which was thought to be mottled and cool.  Vascular surgery was consulted.  She has recently undergone tracheostomy.  The patient has a history of breast cancer, status post lumpectomy.  She is medically managed for hypertension and hypercholesterolemia.  PAST MEDICAL HISTORY    Past Medical History:  Diagnosis Date  . Acute on chronic combined systolic and diastolic CHF (congestive heart failure) ([place])   . Allergic rhinitis   . Anemia   . Arthritis HX RIGHT ANKLE FX  . Cancer [facility])    breast, lumpectomy  . Depression    MAJOR  . Dyslipidemia   . GERD (gastroesophageal reflux disease)   . History of breast cancer DX 2010--  S/P LUMPECTOMY AND RADIATION -- NO RECURRENCE   ONCOLOGIST- DR Truddie Coco  . Hypertension   . Left flank pain   . Left ureteral calculus   . Migraines   . Schizophrenic disorder ([place])   . 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 Type II or unspecified type diabetes mellitus without mention of complication, not stated as uncontrolled 03/16/2014     FAMILY HISTORY   Family History  Problem Relation Age of Onset  . Cancer Sister        breast  . Cancer Mother        breast  . Cancer Father        lung  . Mental illness Maternal  Uncle   . Cancer Maternal Grandmother   . Stroke Maternal Grandmother   . Tuberculosis Maternal Grandfather   . Cancer Paternal Grandmother   . Seizures Neg Hx     SOCIAL HISTORY:   Social History   Socioeconomic History  . Marital status: Married    Spouse name: Not on file  . Number of children: Not on file  . Years of education: Not on file  . Highest education level: Not on file  Occupational History  . Not on file  Social Needs  . Financial resource strain: Not on file  . Food insecurity:    Worry: Not on file    Inability: Not on file  . Transportation needs:    Medical: Not on file    Non-medical: Not on file  Tobacco Use  . Smoking status: Never Smoker  . Smokeless tobacco: Never Used  Substance and Sexual Activity  . Alcohol use: No  . Drug use: No  . Sexual activity: Not Currently  Lifestyle  . Physical activity:    Days per week: Not on file    Minutes per session: Not on file  . Stress: Not on file  Relationships  . Social connections:    Talks on phone: Not on file    Gets together: Not on file  Attends religious service: Not on file    Active member of club or organization: Not on file    Attends meetings of clubs or organizations: Not on file    Relationship status: Not on file  . Intimate partner violence:    Fear of current or ex partner: Not on file    Emotionally abused: Not on file    Physically abused: Not on file    Forced sexual activity: Not on file  Other Topics Concern  . Not on file  Social History Narrative  . Not on file    ALLERGIES:    No Known Allergies  CURRENT MEDICATIONS:    Current Facility-Administered Medications  Medication Dose Route Frequency Provider Last Rate Last Dose  . 0.9 %  sodium chloride infusion   Intravenous PRN Rush Farmer, MD 10 mL/hr at 07/22/18 2200    . albuterol (PROVENTIL) (2.5 MG/3ML) 0.083% nebulizer solution 2.5 mg  2.5 mg Nebulization Q4H PRN Ralene Ok, MD      .  artificial tears (LACRILUBE) ophthalmic ointment   Both Eyes Q8H Ralene Ok, MD   1 application at 93/90/30 2222  . chlorhexidine gluconate (MEDLINE KIT) (PERIDEX) 0.12 % solution 15 mL  15 mL Mouth Rinse BID Ralene Ok, MD   15 mL at 07/22/18 1939  . Chlorhexidine Gluconate Cloth 2 % PADS 6 each  6 each Topical Daily Ralene Ok, MD   6 each at 07/22/18 [phone]  . diltiazem (CARDIZEM) 100 mg in dextrose 5% 131m (1 mg/mL) infusion  5-15 mg/hr Intravenous Continuous RRalene Ok MD 10 mL/hr at 07/22/18 2200 10 mg/hr at 07/22/18 2200  . erythromycin ethylsuccinate (EES) 200 MG/5ML suspension 200 mg  200 mg Per Tube Q8H Rayburn, Kelly A, PA-C   200 mg at 07/22/18 2221  . feeding supplement (VITAL AF 1.2 CAL) liquid 1,500 mL  1,500 mL Per Tube Continuous Rayburn, Kelly A, PA-C 20 mL/hr at 07/22/18 1822 1,500 mL at 07/22/18 1822  . fentaNYL (SUBLIMAZE) bolus via infusion 50 mcg  50 mcg Intravenous Q1H PRN RRalene Ok MD   50 mcg at 07/22/18 0425  . heparin ADULT infusion 100 units/mL (25000 units/2573msodium chloride 0.45%)  1,100 Units/hr Intravenous Continuous YaRush FarmerMD 11 mL/hr at 07/22/18 2200 1,100 Units/hr at 07/22/18 2200  . insulin aspart (novoLOG) injection 0-20 Units  0-20 Units Subcutaneous Q4H RaRalene OkMD   3 Units at 07/22/18 1942  . lacosamide (VIMPAT) 100 mg in sodium chloride 0.9 % 25 mL IVPB  100 mg Intravenous Q12H RaRalene OkMD 70 mL/hr at 07/22/18 2212 100 mg at 07/22/18 2212  . lactulose (CHRONULAC) 10 GM/15ML solution 20 g  20 g Oral BID RaRalene OkMD   20 g at 07/22/18 2212  . levETIRAcetam (KEPPRA) IVPB 1000 mg/100 mL premix  1,000 mg Intravenous Q12H RaRalene OkMD   Stopped at 07/22/18 1231  . LORazepam (ATIVAN) injection 0.5-1 mg  0.5-1 mg Intravenous Q4H PRN RaRalene OkMD   1 mg at 07/22/18 1938  . MEDLINE mouth rinse  15 mL Mouth Rinse 10 times per day RaRalene OkMD   15 mL at 07/22/18 2211  .  metoprolol tartrate (LOPRESSOR) injection 5 mg  5 mg Intravenous Q4H TuSueanne MargaritaMD   5 mg at 07/22/18 1814  . ondansetron (ZOFRAN) injection 4 mg  4 mg Intravenous Q6H PRN RaRalene OkMD   4 mg at 07/12/18 0958  . pantoprazole sodium (PROTONIX) 40 mg/20 mL  oral suspension 40 mg  40 mg Per Tube Daily Ralene Ok, MD   40 mg at 07/22/18 0900  . phenylephrine (NEOSYNEPHRINE) 40-0.9 MG/250ML-% infusion  0-400 mcg/min Intravenous Titrated Rush Farmer, MD 30 mL/hr at 07/22/18 2200 80 mcg/min at 07/22/18 2200  . piperacillin-tazobactam (ZOSYN) IVPB 3.375 g  3.375 g Intravenous Q8H Ralene Ok, MD 12.5 mL/hr at 07/22/18 2200    . propofol (DIPRIVAN) 500 MG/50ML infusion  5-80 mcg/kg/min Intravenous Once Jennelle Human B, NP      . rifaximin Doreene Nest) tablet 550 mg  550 mg Oral TID Ralene Ok, MD   550 mg at 07/22/18 2212  . sodium chloride flush (NS) 0.9 % injection 10-40 mL  10-40 mL Intracatheter Q12H Ralene Ok, MD   10 mL at 07/22/18 0901  . sodium chloride flush (NS) 0.9 % injection 10-40 mL  10-40 mL Intracatheter PRN Ralene Ok, MD      . TPN ADULT (ION)   Intravenous Continuous TPN Tyrone Apple, RPH 70 mL/hr at 07/22/18 2200      REVIEW OF SYSTEMS:   Unable to obtain secondary to intubation  PHYSICAL EXAM:   Vitals:   07/22/18 2115 07/22/18 2145 07/22/18 2200 07/22/18 2230  BP: (!) 89/77 (!) 115/50 (!) 130/109 106/65  Pulse: 82 91 98 92  Resp: (!) 24 19 (!) 24 (!) 21  Temp:   99.4 F (37.4 C)   TempSrc:   Oral   SpO2: 100% 100% 100% 100%  Weight:      Height:        GENERAL: The patient is a well-nourished female, in no acute distress. The vital signs are documented above. CARDIAC: Irregular and tachycardic VASCULAR: Multiphasic bilateral posterior tibial Doppler signals PULMONARY: On ventilator MUSCULOSKELETAL: There are no major deformities or cyanosis. NEUROLOGIC: Intubated and sedated SKIN: Slight discoloration of both legs.  The  right leg is slightly cooler   STUDIES:   Venous Doppler studies were positive for right upper extremity DVT in the subclavian and brachial vein.  ASSESSMENT and PLAN   No evidence of acute arterial ischemia.  I suspect the discoloration and coolness to her feet are secondary to vasopressors.  I will try to wean this as tolerated.  She is on heparin already for her DVT.  No acute vascular surgery intervention is recommended.  Please contact me with any further questions.  I will not actively follow the patient.   Annamarie Major, MD [facility] [phone] Pager [phone]

## 2018-07-22 NOTE — Progress Notes (Signed)
[facility] ADULT ICU REPLACEMENT PROTOCOL FOR AM LAB REPLACEMENT ONLY  The patient does apply for the [facility] Adult ICU Electrolyte Replacment Protocol based on the criteria listed below:   1. Is GFR >/= 40 ml/min? Yes.    Patient's GFR today is >60 2. Is urine output >/= 0.5 ml/kg/hr for the last 6 hours? Yes.   Patient's UOP is .7 ml/kg/hr 3. Is BUN < 60 mg/dL? Yes.    Patient's BUN today is 58 4. Abnormal electrolyte K-3.1 5. Ordered repletion with: per protocol 6. If a panic level lab has been reported, has the CCM MD in charge been notified? Yes.  .   Physician:  Dr. Ethlyn Gallery, Philis Nettle 07/22/2018 5:57 AM

## 2018-07-22 NOTE — Progress Notes (Signed)
eLink Physician-Brief Progress Note Patient Name: Brandi [name] DOB: 1956/02/28 MRN: [id number]   Date of Service  07/22/2018  HPI/Events of Note  New DVT in the right subclavian, Brachial and the left subclavian. Pt has a PICC line in place and is a serious vascular access challenge. She also has a cool right lower ext with sluggish cap refill but DP pulses are palpable in the right foot. Pt is already on Heparin infusion for Afib.  eICU Interventions  Arterial doppler study of the right foot stat, change Heparin infusion to DVT infusion protocol, vascular surgery consult stat        Kerry Kass Belmira Daley 07/22/2018, 9:48 PM

## 2018-07-22 NOTE — Progress Notes (Signed)
Heparin previously stopped upon [facility] rounding this shift.  Restarted per [facility] after further review of procedure timing.

## 2018-07-22 NOTE — Progress Notes (Addendum)
ANTICOAGULATION CONSULT NOTE - Follow Up Consult  Pharmacy Consult for Heparin Indication: atrial fibrillation/DVT  No Known Allergies  Patient Measurements: Height: 5' 2.01" (157.5 cm) Weight: 201 lb 4.5 oz (91.3 kg) IBW/kg (Calculated) : 50.12 Heparin Dosing Weight: 70 kg  Vital Signs: Temp: 99.6 F (37.6 C) (08/07 0748) Temp Source: Axillary (08/07 0748) BP: 95/65 (08/07 1000) Pulse Rate: 75 (08/07 1000)  Labs: Recent Labs    07/20/18 0341 07/21/18 0421 07/21/18 0538 07/21/18 1341 07/21/18 2045 07/22/18 0329  HGB 8.1* 7.0*  --  9.5*  --  9.0*  HCT 26.0* 23.8*  --  30.5*  --  28.6*  PLT 93* 108*  --   --   --  109*  LABPROT 17.2* 19.3*  --   --   --  17.2*  INR 1.41 1.64  --   --   --  1.41  HEPARINUNFRC  --   --   --   --  0.19* <0.10*  CREATININE 0.72  --  0.81  --   --  0.87    Estimated Creatinine Clearance: 70.5 mL/min (by C-G formula based on SCr of 0.87 mg/dL).  Assessment:   62 yo F with initial onset Afib this admission briefly started on heparin before transitioning to apixaban on 7/14. Anticoagulation had been held since 7/15 due to GI bleed. Restarted heparin gtt 8/7 per CCM, currently on hold due to planned trach placement.   Hgb 9.0,  plts 109.  No bleeding reported.  Goal of Therapy:  Heparin level 0.3-0.7 units/ml Monitor platelets by anticoagulation protocol: Yes   Plan:  -Restart heparin drip at 1100 units/hr at 2100, approx 6hr post trach placement.  -Heparin level and CBC daily, monitor for s/s bleeding  Harrietta Guardian, PharmD PGY1 Pharmacy Resident Phone [phone] 07/22/2018    11:02 AM

## 2018-07-22 NOTE — Procedures (Signed)
Percutaneous Tracheostomy Placement  Consent from family.  Patient sedated, paralyzed and position.  Placed on 100% FiO2 and RR matched.  Area cleaned and draped.  Lidocaine/epi injected.  Skin incision done followed by blunt dissection.  Trachea palpated then punctured, catheter passed and visualized bronchoscopically.  Wire placed and visualized.  Catheter removed.  Airway then entered and dilated.  Size 6 cuffed shiley trach placed and visualized bronchoscopically well above carina.  Foam pad placed and trach sutured.  Good volume returns.  Patient tolerated the procedure well without complications.  Minimal blood loss.  CXR ordered and pending.  Rush Farmer, M.D. [facility] Pulmonary/Critical Care Medicine. Pager: [phone]. After hours pager: [phone].

## 2018-07-22 NOTE — Progress Notes (Signed)
Progress Note  Patient Name: Brandi [name] Date of Encounter: 07/22/2018  Primary Cardiologist: Mertie Moores, MD   Subjective   62 year old female who was admitted with abdominal pain and was eventually found to have a perforated bowel.  He has had numerous complications since I last saw her in early July.  She remains in atrial fibrillation with a rapid ventricular response. Mains critically ill on the ventilator.  She is had a stroke. Been on the ventilator for weeks.  She is scheduled for tracheostomy today.  Inpatient Medications    Scheduled Meds: . artificial tears   Both Eyes Q8H  . chlorhexidine gluconate (MEDLINE KIT)  15 mL Mouth Rinse BID  . Chlorhexidine Gluconate Cloth  6 each Topical Daily  . erythromycin ethylsuccinate  200 mg Per Tube Q8H  . etomidate  40 mg Intravenous Once  . fentaNYL (SUBLIMAZE) injection  200 mcg Intravenous Once  . insulin aspart  0-20 Units Subcutaneous Q4H  . lactulose  20 g Oral BID  . mouth rinse  15 mL Mouth Rinse 10 times per day  . metoprolol tartrate  5 mg Intravenous Q4H  . midazolam  4 mg Intravenous Once  . pantoprazole sodium  40 mg Per Tube Daily  . rifaximin  550 mg Oral TID  . sodium chloride flush  10-40 mL Intracatheter Q12H  . vecuronium  10 mg Intravenous Once   Continuous Infusions: . sodium chloride 10 mL/hr at 07/22/18 0949  . diltiazem (CARDIZEM) infusion 10 mg/hr (07/22/18 0900)  . feeding supplement (VITAL AF 1.2 CAL) Stopped (07/21/18 1635)  . heparin    . lacosamide (VIMPAT) IV 100 mg (07/22/18 0950)  . levETIRAcetam 1,000 mg (07/22/18 1216)  . phenylephrine (NEO-SYNEPHRINE) Adult infusion 150 mcg/min (07/22/18 1217)  . piperacillin-tazobactam (ZOSYN)  IV 12.5 mL/hr at 07/22/18 0900  . propofol    . TPN ADULT (ION) 70 mL/hr at 07/22/18 0900  . TPN ADULT (ION)     PRN Meds: sodium chloride, albuterol, fentaNYL, LORazepam, [DISCONTINUED] ondansetron **OR** ondansetron (ZOFRAN) IV, sodium chloride  flush   Vital Signs    Vitals:   07/22/18 1100 07/22/18 1107 07/22/18 1213 07/22/18 1220  BP: 92/60 92/60    Pulse: 72 (!) 115    Resp: 18 (!) 22    Temp:   (!) 100.6 F (38.1 C)   TempSrc:   Axillary   SpO2: 100% 100%    Weight:    198 lb 3.1 oz (89.9 kg)  Height:        Intake/Output Summary (Last 24 hours) at 07/22/2018 1306 Last data filed at 07/22/2018 0900 Gross per 24 hour  Intake 3252.89 ml  Output 2110 ml  Net 1142.89 ml   Filed Weights   07/21/18 1214 07/22/18 0530 07/22/18 1220  Weight: 200 lb 2.8 oz (90.8 kg) 201 lb 4.5 oz (91.3 kg) 198 lb 3.1 oz (89.9 kg)    Telemetry     AF with rapid ventricular rate.  Personally Reviewed  ECG    AF with rapid ventricular rate  - Personally Reviewed  Physical Exam   GEN:  Middle-aged female.  She is acutely ill.  She is markedly edematous including scleral edema.  She is unresponsive on a ventilator. Neck:  Thick neck. Cardiac:  Irregularly irregular.  She is tachycardic. Respiratory:  Letter sounds. GI:  Status post surgery. MS:  Marked edema/anasarca.  Images are cool. Skin is yellow. Neuro:   Responsive.  She has decorticate posturing. Psych: Unable to  assess.  Labs    Chemistry Recent Labs  Lab 07/16/18 0910  07/18/18 0930  07/20/18 0341 07/21/18 0538 07/22/18 0329  NA 145   < > 147*   < > 150* 150* 147*  K 3.9   < > 3.1*   < > 2.9* 4.0 3.1*  CL 105   < > 111   < > 113* 112* 109  CO2 25   < > 29   < > '30 29 29  ' GLUCOSE 104*   < > 202*   < > 179* 164* 160*  BUN 49*   < > 53*   < > 47* 52* 58*  CREATININE 1.41*   < > 0.99   < > 0.72 0.81 0.87  CALCIUM 7.9*   < > 7.7*   < > 7.8* 7.9* 7.9*  PROT 4.0*  --  3.7*  --  3.8*  --   --   ALBUMIN 2.2*  --  1.6*  --  1.6*  --   --   AST 89*  --  72*  --  74*  --   --   ALT 51*  --  50*  --  60*  --   --   ALKPHOS 352*  --  710*  --  505*  --   --   BILITOT 9.5*  --  10.1*  --  8.4*  --   --   GFRNONAA 39*   < > 60*   < > >60 >60 >60  GFRAA 45*   < > >60   <  > >60 >60 >60  ANIONGAP 15   < > 7   < > '7 9 9   ' < > = values in this interval not displayed.     Hematology Recent Labs  Lab 07/20/18 0341 07/21/18 0421 07/21/18 1341 07/22/18 0329  WBC 38.8* 34.8*  --  32.8*  RBC 3.24* 2.83*  --  3.40*  HGB 8.1* 7.0* 9.5* 9.0*  HCT 26.0* 23.8* 30.5* 28.6*  MCV 80.2 84.1  --  84.1  MCH 25.0* 24.7*  --  26.5  MCHC 31.2 29.4*  --  31.5  RDW 29.7* 32.3*  --  28.7*  PLT 93* 108*  --  109*    Cardiac EnzymesNo results for input(s): TROPONINI in the last 168 hours. No results for input(s): TROPIPOC in the last 168 hours.   BNPNo results for input(s): BNP, PROBNP in the last 168 hours.   DDimer No results for input(s): DDIMER in the last 168 hours.   Radiology    Ct Abdomen Pelvis W Contrast  Addendum Date: 07/20/2018   ADDENDUM REPORT: 07/20/2018 14:19 ADDENDUM: Findings of venous gas discussed with Dr. Nelda Marseille. Findings of DVT called to the patient's nurse, Maudie Mercury. Electronically Signed   By: Dorise Bullion III M.D   On: 07/20/2018 14:19   Result Date: 07/20/2018 CLINICAL DATA:  Status post colectomy and ileostomy after bowel perforation. Jaundice and leukocytosis. EXAM: CT ABDOMEN AND PELVIS WITH CONTRAST TECHNIQUE: Multidetector CT imaging of the abdomen and pelvis was performed using the standard protocol following bolus administration of intravenous contrast. CONTRAST:  126m OMNIPAQUE IOHEXOL 300 MG/ML  SOLN COMPARISON:  None. FINDINGS: Lower chest: Bilateral pleural effusions with underlying pulmonary opacities and atelectasis. The effusions and atelectasis have worsened in the interval. No other acute abnormalities in the lower chest. Hepatobiliary: Branching air seen in the peripheral liver, particularly the left hepatic lobe. Branching air is also seen in the caudate.  There is air in the superior mesenteric vein as seen on coronal image 70. There is also air in multiple small bowel draining veins. No liver masses. The patient is status post  cholecystectomy. Portal vein is patent. Pancreas: Unremarkable. No pancreatic ductal dilatation or surrounding inflammatory changes. Spleen: Normal in size without focal abnormality. Adrenals/Urinary Tract: Multiple small cysts in the kidneys without obstruction. The ureters are normal with no stones. The bladder is unremarkable but poorly evaluated due to a Foley catheter. Adrenal glands are normal. Stomach/Bowel: A feeding tube terminates just beyond the ligament of Treitz. There appears to be marked wall thickening in the fundus of the stomach well seen on coronal image 101. An NG tube terminates in the gastric fundus. The remainder of the stomach is unremarkable. There is diffuse dilatation of the small bowel extending from the stomach to the level of the ileostomy. Pneumatosis is identified. Fecal material seen within the small bowel. There is air in multiple small bowel draining veins. Colon is been removed. Vascular/Lymphatic: Mild atherosclerotic changes in the abdominal aorta. Suggested filling defect in the right femoral vein as seen on series 3, image 100 and coronal image 80. No adenopathy. Reproductive: Uterus and bilateral adnexa are unremarkable. Other: There is air in small bowel draining veins. No definitive free air identified on this study. Musculoskeletal: No acute or significant osseous findings. IMPRESSION: 1. Small bowel pneumatosis. Air within draining veins of the small bowel, the superior mesenteric vein, and intrahepatic branches of the portal vein. 2. Marked gastric wall thickening in the fundus of the stomach. 3. Suspected right femoral DVT. 4. Marked ascites. 5. Increasing bilateral effusions with underlying opacities. 6. Mild atherosclerotic changes in the abdominal aorta. Findings called to the patient's doctor. Electronically Signed: By: Dorise Bullion III M.D On: 07/20/2018 14:00   Dg Chest Port 1 View  Result Date: 07/21/2018 CLINICAL DATA:  Respiratory failure and shortness of  Breath EXAM: PORTABLE CHEST 1 VIEW COMPARISON:  07/20/18 FINDINGS: Cardiac shadow is mildly enlarged. Endotracheal tube, nasogastric catheter and feeding catheter are again seen and stable. Right jugular and right subclavian central lines are seen and stable. The overall inspiratory effort is stable. Changes of bilateral pleural effusions and bibasilar atelectasis are again seen and stable. No new focal abnormality is seen. IMPRESSION: No significant change from the prior exam. Electronically Signed   By: Inez Catalina M.D.   On: 07/21/2018 07:06    Cardiac Studies     Patient Profile     62 y.o. female multiorgan failure.  We are asked to see her again for atrial fib   Assessment & Plan    1.  Atrial fibrillation: Patient has atrial  fibrillation that was first diagnosed in early July.  She has multiorgan failure is likely contributing to her rapid atrial fibrillation She has respiratory failure and is still on the vent. She remains on the dilt drip   Her blood pressure is marginal and she still requires phenylephrine drip   It will be difficult to control her HR any better given her multi-organ failure and numerous medical issues Given her liver failure, I would not use amiodarone. I do not think there are many options available to help with this afib.    Her prognosis is very poor .         For questions or updates, please contact [place] Please consult [email or web address] for contact info under Cardiology/STEMI.      Signed, Mertie Moores, MD  07/22/2018, 1:06 PM

## 2018-07-22 NOTE — Procedures (Signed)
Bronchoscopy Procedure Note Brandi [name] [id number] December 05, 1956  Procedure: Bronchoscopy Indications: Assistance for percutaneous tracheostomy  Procedure Details Consent: Risks of procedure as well as the alternatives and risks of each were explained to the (patient/caregiver).  Consent for procedure obtained. Time Out: Verified patient identification, verified procedure, site/side was marked, verified correct patient position, special equipment/implants available, medications/allergies/relevent history reviewed, required imaging and test results available.  Performed  Performed in ICU.  Placed vent to 100% FiO2.    Bronchoscope entered through endotracheal tube and advanced to carina.  Endotracheal tube withdrawn from 21 cm to 14 cm at lip under direct observation.  Observed finder needle, guidewire, dilators and then tracheostomy entering trachea.  No evidence for posterior wall injury or bleeding.  Bronchoscope passed through #6 cuffed tracheostomy, and this was observed in good position above the carina.  No evidence for bleeding.  She had transient hypotension that improved with fluid bolus 500 ml and increased dose of pressors.  Oxygenation stable throughout procedure.  Post procedure chest xray pending.  Chesley Mires, MD [facility] Pulmonary/Critical Care 07/22/2018, 2:50 PM

## 2018-07-22 NOTE — Progress Notes (Signed)
2 Days Post-Op   Subjective/Chief Complaint: Pt with NAE overnight Remains in [place] this AM Heparin gtt ongoing TFs off for increased residuals  Objective: Vital signs in last 24 hours: Temp:  [99.1 F (37.3 C)-100.4 F (38 C)] 99.8 F (37.7 C) (08/07 0335) Pulse Rate:  [66-123] 108 (08/07 0739) Resp:  [16-32] 18 (08/07 0739) BP: (49-135)/(25-122) 83/73 (08/07 0739) SpO2:  [93 %-100 %] 99 % (08/07 0739) FiO2 (%):  [4 %-40 %] 40 % (08/07 0739) Weight:  [90.8 kg (200 lb 2.8 oz)-91.3 kg (201 lb 4.5 oz)] 91.3 kg (201 lb 4.5 oz) (08/07 0530) Last BM Date: 07/22/18  Intake/Output from previous day: 08/06 0701 - 08/07 0700 In: 3894.1 [I.V.:2309; Blood:315; NG/GT:831.7; IV Piggyback:438.4] Out: 2245 [Urine:1185; Emesis/NG output:600; Drains:225; Stool:235] Intake/Output this shift: No intake/output data recorded.    Lab Results:  Recent Labs    07/21/18 0421 07/21/18 1341 07/22/18 0329  WBC 34.8*  --  32.8*  HGB 7.0* 9.5* 9.0*  HCT 23.8* 30.5* 28.6*  PLT 108*  --  109*   BMET Recent Labs    07/21/18 0538 07/22/18 0329  NA 150* 147*  K 4.0 3.1*  CL 112* 109  CO2 29 29  GLUCOSE 164* 160*  BUN 52* 58*  CREATININE 0.81 0.87  CALCIUM 7.9* 7.9*   PT/INR Recent Labs    07/21/18 0421 07/22/18 0329  LABPROT 19.3* 17.2*  INR 1.64 1.41   Studies/Results: Ct Abdomen Pelvis W Contrast  Addendum Date: 07/20/2018   ADDENDUM REPORT: 07/20/2018 14:19 ADDENDUM: Findings of venous gas discussed with Dr. Nelda Marseille. Findings of DVT called to the patient's nurse, Maudie Mercury. Electronically Signed   By: Dorise Bullion III M.D   On: 07/20/2018 14:19   Result Date: 07/20/2018 CLINICAL DATA:  Status post colectomy and ileostomy after bowel perforation. Jaundice and leukocytosis. EXAM: CT ABDOMEN AND PELVIS WITH CONTRAST TECHNIQUE: Multidetector CT imaging of the abdomen and pelvis was performed using the standard protocol following bolus administration of intravenous contrast. CONTRAST:   152mL OMNIPAQUE IOHEXOL 300 MG/ML  SOLN COMPARISON:  None. FINDINGS: Lower chest: Bilateral pleural effusions with underlying pulmonary opacities and atelectasis. The effusions and atelectasis have worsened in the interval. No other acute abnormalities in the lower chest. Hepatobiliary: Branching air seen in the peripheral liver, particularly the left hepatic lobe. Branching air is also seen in the caudate. There is air in the superior mesenteric vein as seen on coronal image 70. There is also air in multiple small bowel draining veins. No liver masses. The patient is status post cholecystectomy. Portal vein is patent. Pancreas: Unremarkable. No pancreatic ductal dilatation or surrounding inflammatory changes. Spleen: Normal in size without focal abnormality. Adrenals/Urinary Tract: Multiple small cysts in the kidneys without obstruction. The ureters are normal with no stones. The bladder is unremarkable but poorly evaluated due to a Foley catheter. Adrenal glands are normal. Stomach/Bowel: A feeding tube terminates just beyond the ligament of Treitz. There appears to be marked wall thickening in the fundus of the stomach well seen on coronal image 101. An NG tube terminates in the gastric fundus. The remainder of the stomach is unremarkable. There is diffuse dilatation of the small bowel extending from the stomach to the level of the ileostomy. Pneumatosis is identified. Fecal material seen within the small bowel. There is air in multiple small bowel draining veins. Colon is been removed. Vascular/Lymphatic: Mild atherosclerotic changes in the abdominal aorta. Suggested filling defect in the right femoral vein as seen on series 3,  image 100 and coronal image 80. No adenopathy. Reproductive: Uterus and bilateral adnexa are unremarkable. Other: There is air in small bowel draining veins. No definitive free air identified on this study. Musculoskeletal: No acute or significant osseous findings. IMPRESSION: 1. Small  bowel pneumatosis. Air within draining veins of the small bowel, the superior mesenteric vein, and intrahepatic branches of the portal vein. 2. Marked gastric wall thickening in the fundus of the stomach. 3. Suspected right femoral DVT. 4. Marked ascites. 5. Increasing bilateral effusions with underlying opacities. 6. Mild atherosclerotic changes in the abdominal aorta. Findings called to the patient's doctor. Electronically Signed: By: Dorise Bullion III M.D On: 07/20/2018 14:00   Dg Chest Port 1 View  Result Date: 07/21/2018 CLINICAL DATA:  Respiratory failure and shortness of Breath EXAM: PORTABLE CHEST 1 VIEW COMPARISON:  07/20/18 FINDINGS: Cardiac shadow is mildly enlarged. Endotracheal tube, nasogastric catheter and feeding catheter are again seen and stable. Right jugular and right subclavian central lines are seen and stable. The overall inspiratory effort is stable. Changes of bilateral pleural effusions and bibasilar atelectasis are again seen and stable. No new focal abnormality is seen. IMPRESSION: No significant change from the prior exam. Electronically Signed   By: Inez Catalina M.D.   On: 07/21/2018 07:06    Anti-infectives: Anti-infectives (From admission, onward)   Start     Dose/Rate Route Frequency Ordered Stop   07/21/18 1000  erythromycin ethylsuccinate (EES) 200 MG/5ML suspension 200 mg  Status:  Discontinued     200 mg Oral Every 8 hours 07/21/18 0906 07/21/18 0954   07/21/18 1000  erythromycin ethylsuccinate (EES) 200 MG/5ML suspension 200 mg     200 mg Per Tube Every 8 hours 07/21/18 0954     07/17/18 1600  rifaximin (XIFAXAN) tablet 550 mg     550 mg Oral 3 times daily 07/17/18 1254     07/14/18 0600  ceFAZolin (ANCEF) IVPB 2g/100 mL premix     2 g 200 mL/hr over 30 Minutes Intravenous On call to O.R. 07/13/18 2234 07/14/18 1052   07/13/18 2100  fluconazole (DIFLUCAN) IVPB 200 mg  Status:  Discontinued     200 mg 100 mL/hr over 60 Minutes Intravenous Every 24 hours  07/12/18 1958 07/15/18 1224   07/13/18 1800  vancomycin (VANCOCIN) IVPB 1000 mg/200 mL premix  Status:  Discontinued     1,000 mg 200 mL/hr over 60 Minutes Intravenous Every 24 hours 07/12/18 1549 07/15/18 1224   07/13/18 0100  piperacillin-tazobactam (ZOSYN) IVPB 3.375 g     3.375 g 12.5 mL/hr over 240 Minutes Intravenous Every 8 hours 07/12/18 1549     07/12/18 2100  fluconazole (DIFLUCAN) IVPB 400 mg     400 mg 100 mL/hr over 120 Minutes Intravenous  Once 07/12/18 1958 07/12/18 2306   07/12/18 1600  vancomycin (VANCOCIN) 1,500 mg in sodium chloride 0.9 % 500 mL IVPB     1,500 mg 250 mL/hr over 120 Minutes Intravenous STAT 07/12/18 1549 07/12/18 1829   07/12/18 1600  piperacillin-tazobactam (ZOSYN) IVPB 3.375 g     3.375 g 100 mL/hr over 30 Minutes Intravenous STAT 07/12/18 1549 07/12/18 1701   07/12/18 0800  ampicillin-sulbactam (UNASYN) 1.5 g in sodium chloride 0.9 % 100 mL IVPB  Status:  Discontinued     1.5 g 200 mL/hr over 30 Minutes Intravenous Every 6 hours 07/12/18 0715 07/12/18 1538   07/04/18 1000  rifaximin (XIFAXAN) tablet 550 mg  Status:  Discontinued     550 mg  Per Tube 3 times daily 07/04/18 0925 07/12/18 1647   06/30/18 2200  vancomycin (VANCOCIN) IVPB 1000 mg/200 mL premix  Status:  Discontinued     1,000 mg 200 mL/hr over 60 Minutes Intravenous Every 24 hours 06/30/18 2008 07/04/18 0925   06/30/18 2200  piperacillin-tazobactam (ZOSYN) IVPB 3.375 g  Status:  Discontinued     3.375 g 12.5 mL/hr over 240 Minutes Intravenous Every 8 hours 06/30/18 2011 07/06/18 1149   06/30/18 0000  vancomycin variable dose per unstable renal function (pharmacist dosing)  Status:  Discontinued      Does not apply See admin instructions 06/29/18 1229 07/03/18 0747   06/29/18 1300  piperacillin-tazobactam (ZOSYN) IVPB 2.25 g  Status:  Discontinued     2.25 g 100 mL/hr over 30 Minutes Intravenous Every 8 hours 06/29/18 1229 06/30/18 2011   06/29/18 1300  vancomycin (VANCOCIN) 1,250 mg in  sodium chloride 0.9 % 250 mL IVPB     1,250 mg 166.7 mL/hr over 90 Minutes Intravenous  Once 06/29/18 1229 06/29/18 2234      Assessment/Plan: Brandi [name] cirrhosis with hepatic encephalopathy New onset atrial fibrillation with rapid ventricular rate New onset cardiomyopathy with EF 30 to 35% NewRight frontal operculum infarct History of seizure disorder Hypertension Type 2 diabetes well controlled History of schizophrenia  Septic shock due to perforated descending colon S/p ex-lap with ileocecectomy and partial descending colectomy left in discontinuity with abdominal wound VAC placement 07/12/18 Dr. Barry Dienes - POD#10 S/p ex-lap partial colectomy with closure of abdomen and creation of end ileostomy 07/14/18 Dr. Brantley Stage - POD#7 S/p exploratory laparotomy with closure and VAC placement 07/20/18 Dr. Rosendo Gros - POD#2 -TFs on hold currently for trach today - start erythromycin for motility  - continue VAC changes M/W/F Leukocytosis - WBC 32.8 from 34.8 yesterday, pt afebrile  - CT abdomen/pelvis yesterday showed concern for pneumatosis but ex-lap negative for bowel ischemia or intraabdominal abscess  FEN: IVF,TPN, TF at 20 cc/h ; NGT to LIWS; OK to resume TFs after trach at 20cc/hr VTE: SCDs ID: current abx - Zosyn 7/29>>, rifaximin 8/2>>   CC time: 0720-0750   LOS: 29 days    Rosario Jacks., Anne Hahn 07/22/2018

## 2018-07-23 ENCOUNTER — Inpatient Hospital Stay: Payer: Self-pay

## 2018-07-23 ENCOUNTER — Inpatient Hospital Stay (HOSPITAL_COMMUNITY): Payer: Medicare Other

## 2018-07-23 ENCOUNTER — Encounter (HOSPITAL_COMMUNITY): Payer: Self-pay

## 2018-07-23 ENCOUNTER — Inpatient Hospital Stay
Admission: AD | Admit: 2018-07-23 | Discharge: 2018-08-16 | Disposition: E | Payer: Self-pay | Source: Ambulatory Visit | Attending: Internal Medicine | Admitting: Internal Medicine

## 2018-07-23 ENCOUNTER — Other Ambulatory Visit (HOSPITAL_COMMUNITY): Payer: Self-pay

## 2018-07-23 DIAGNOSIS — Z48815 Encounter for surgical aftercare following surgery on the digestive system: Secondary | ICD-10-CM | POA: Diagnosis not present

## 2018-07-23 DIAGNOSIS — Z7901 Long term (current) use of anticoagulants: Secondary | ICD-10-CM | POA: Diagnosis not present

## 2018-07-23 DIAGNOSIS — J9601 Acute respiratory failure with hypoxia: Secondary | ICD-10-CM | POA: Diagnosis not present

## 2018-07-23 DIAGNOSIS — G9340 Encephalopathy, unspecified: Secondary | ICD-10-CM | POA: Diagnosis present

## 2018-07-23 DIAGNOSIS — K631 Perforation of intestine (nontraumatic): Secondary | ICD-10-CM | POA: Diagnosis not present

## 2018-07-23 DIAGNOSIS — I63411 Cerebral infarction due to embolism of right middle cerebral artery: Secondary | ICD-10-CM | POA: Diagnosis not present

## 2018-07-23 DIAGNOSIS — I5043 Acute on chronic combined systolic (congestive) and diastolic (congestive) heart failure: Secondary | ICD-10-CM | POA: Diagnosis not present

## 2018-07-23 DIAGNOSIS — Z4682 Encounter for fitting and adjustment of non-vascular catheter: Secondary | ICD-10-CM | POA: Diagnosis not present

## 2018-07-23 DIAGNOSIS — J9690 Respiratory failure, unspecified, unspecified whether with hypoxia or hypercapnia: Secondary | ICD-10-CM | POA: Diagnosis present

## 2018-07-23 DIAGNOSIS — D500 Iron deficiency anemia secondary to blood loss (chronic): Secondary | ICD-10-CM | POA: Diagnosis not present

## 2018-07-23 DIAGNOSIS — I69891 Dysphagia following other cerebrovascular disease: Secondary | ICD-10-CM | POA: Diagnosis not present

## 2018-07-23 DIAGNOSIS — R109 Unspecified abdominal pain: Secondary | ICD-10-CM

## 2018-07-23 DIAGNOSIS — B199 Unspecified viral hepatitis without hepatic coma: Secondary | ICD-10-CM | POA: Diagnosis present

## 2018-07-23 DIAGNOSIS — I4891 Unspecified atrial fibrillation: Secondary | ICD-10-CM | POA: Diagnosis not present

## 2018-07-23 DIAGNOSIS — I509 Heart failure, unspecified: Secondary | ICD-10-CM | POA: Diagnosis present

## 2018-07-23 DIAGNOSIS — K6389 Other specified diseases of intestine: Secondary | ICD-10-CM | POA: Diagnosis not present

## 2018-07-23 DIAGNOSIS — R531 Weakness: Secondary | ICD-10-CM | POA: Diagnosis present

## 2018-07-23 DIAGNOSIS — K658 Other peritonitis: Secondary | ICD-10-CM | POA: Diagnosis present

## 2018-07-23 DIAGNOSIS — E46 Unspecified protein-calorie malnutrition: Secondary | ICD-10-CM | POA: Diagnosis present

## 2018-07-23 DIAGNOSIS — I482 Chronic atrial fibrillation: Secondary | ICD-10-CM | POA: Diagnosis present

## 2018-07-23 DIAGNOSIS — Z430 Encounter for attention to tracheostomy: Secondary | ICD-10-CM | POA: Diagnosis not present

## 2018-07-23 DIAGNOSIS — G9341 Metabolic encephalopathy: Secondary | ICD-10-CM | POA: Diagnosis not present

## 2018-07-23 DIAGNOSIS — R569 Unspecified convulsions: Secondary | ICD-10-CM | POA: Diagnosis present

## 2018-07-23 DIAGNOSIS — Z9911 Dependence on respirator [ventilator] status: Secondary | ICD-10-CM | POA: Diagnosis not present

## 2018-07-23 DIAGNOSIS — K767 Hepatorenal syndrome: Secondary | ICD-10-CM | POA: Diagnosis not present

## 2018-07-23 DIAGNOSIS — I82621 Acute embolism and thrombosis of deep veins of right upper extremity: Secondary | ICD-10-CM | POA: Diagnosis present

## 2018-07-23 DIAGNOSIS — I110 Hypertensive heart disease with heart failure: Secondary | ICD-10-CM | POA: Diagnosis not present

## 2018-07-23 DIAGNOSIS — Z0189 Encounter for other specified special examinations: Secondary | ICD-10-CM

## 2018-07-23 DIAGNOSIS — F209 Schizophrenia, unspecified: Secondary | ICD-10-CM | POA: Diagnosis present

## 2018-07-23 DIAGNOSIS — E860 Dehydration: Secondary | ICD-10-CM | POA: Diagnosis not present

## 2018-07-23 DIAGNOSIS — I82622 Acute embolism and thrombosis of deep veins of left upper extremity: Secondary | ICD-10-CM | POA: Diagnosis present

## 2018-07-23 DIAGNOSIS — R1310 Dysphagia, unspecified: Secondary | ICD-10-CM | POA: Diagnosis present

## 2018-07-23 DIAGNOSIS — K7200 Acute and subacute hepatic failure without coma: Secondary | ICD-10-CM | POA: Diagnosis not present

## 2018-07-23 DIAGNOSIS — G2401 Drug induced subacute dyskinesia: Secondary | ICD-10-CM | POA: Diagnosis present

## 2018-07-23 LAB — COMPREHENSIVE METABOLIC PANEL
ALBUMIN: 1.4 g/dL — AB (ref 3.5–5.0)
ALK PHOS: 164 U/L — AB (ref 38–126)
ALT: 55 U/L — ABNORMAL HIGH (ref 0–44)
AST: 55 U/L — AB (ref 15–41)
Anion gap: 7 (ref 5–15)
BUN: 61 mg/dL — AB (ref 8–23)
CALCIUM: 7.9 mg/dL — AB (ref 8.9–10.3)
CHLORIDE: 113 mmol/L — AB (ref 98–111)
CO2: 26 mmol/L (ref 22–32)
Creatinine, Ser: 0.83 mg/dL (ref 0.44–1.00)
GFR calc Af Amer: >60 mL/min (ref >60)
GFR calc non Af Amer: >60 mL/min (ref >60)
GLUCOSE: 202 mg/dL — AB (ref 70–99)
Potassium: 3.7 mmol/L (ref 3.5–5.1)
SODIUM: 146 mmol/L — AB (ref 135–145)
Total Bilirubin: 7.6 mg/dL — ABNORMAL HIGH (ref 0.3–1.2)
Total Protein: 3.6 g/dL — ABNORMAL LOW (ref 6.5–8.1)

## 2018-07-23 LAB — BLOOD GAS, ARTERIAL
Acid-Base Excess: 1.6 mmol/L (ref 0.0–2.0)
BICARBONATE: 24.2 mmol/L (ref 20.0–28.0)
DRAWN BY: 252031
FIO2: 0.6
LHR: 18 {breaths}/min
MECHVT: 460 mL
O2 Saturation: 99.2 %
PATIENT TEMPERATURE: 101.2
PCO2 ART: 31.1 mmHg — AB (ref 32.0–48.0)
PIP: 5 cmH2O
PO2 ART: 128 mmHg — AB (ref 83.0–108.0)
pH, Arterial: 7.509 — ABNORMAL HIGH (ref 7.350–7.450)

## 2018-07-23 LAB — PROTIME-INR
INR: 1.36
Prothrombin Time: 16.7 seconds — ABNORMAL HIGH (ref 11.4–15.2)

## 2018-07-23 LAB — CBC
HEMATOCRIT: 28.2 % — AB (ref 36.0–46.0)
HEMOGLOBIN: 8.7 g/dL — AB (ref 12.0–15.0)
MCH: 26.5 pg (ref 26.0–34.0)
MCHC: 30.9 g/dL (ref 30.0–36.0)
MCV: 86 fL (ref 78.0–100.0)
Platelets: 105 10*3/uL — ABNORMAL LOW (ref 150–400)
RBC: 3.28 MIL/uL — ABNORMAL LOW (ref 3.87–5.11)
RDW: 30.5 % — AB (ref 11.5–15.5)
WBC: 23 10*3/uL — ABNORMAL HIGH (ref 4.0–10.5)

## 2018-07-23 LAB — PHOSPHORUS: PHOSPHORUS: 2.6 mg/dL (ref 2.5–4.6)

## 2018-07-23 LAB — FIBRINOGEN: Fibrinogen: 504 mg/dL — ABNORMAL HIGH (ref 210–475)

## 2018-07-23 LAB — GLUCOSE, CAPILLARY
GLUCOSE-CAPILLARY: 187 mg/dL — AB (ref 70–99)
GLUCOSE-CAPILLARY: 181 mg/dL — AB (ref 70–99)
Glucose-Capillary: 189 mg/dL — ABNORMAL HIGH (ref 70–99)

## 2018-07-23 LAB — PROCALCITONIN: Procalcitonin: 1.12 ng/mL

## 2018-07-23 LAB — MAGNESIUM: Magnesium: 2 mg/dL (ref 1.7–2.4)

## 2018-07-23 LAB — HEPARIN LEVEL (UNFRACTIONATED): Heparin Unfractionated: 0.19 IU/mL — ABNORMAL LOW (ref 0.30–0.70)

## 2018-07-23 MED ORDER — FENTANYL CITRATE (PF) 100 MCG/2ML IJ SOLN
50.0000 ug | INTRAMUSCULAR | Status: DC | PRN
  Administered 2018-07-23: 50 ug via INTRAVENOUS
  Filled 2018-07-23: qty 2

## 2018-07-23 MED ORDER — DILTIAZEM 12 MG/ML ORAL SUSPENSION: 60.0000 mg | Freq: Four times a day (QID) | ORAL | Status: AC

## 2018-07-23 MED ORDER — LACTULOSE 10 GM/15ML PO SOLN: 20.0000 g | Freq: Two times a day (BID) | ORAL | 0 refills | Status: AC

## 2018-07-23 MED ORDER — CHLORHEXIDINE GLUCONATE 0.12% ORAL RINSE (MEDLINE KIT): 15.0000 mL | Freq: Two times a day (BID) | OROMUCOSAL | 0 refills | Status: AC

## 2018-07-23 MED ORDER — PANTOPRAZOLE SODIUM 40 MG PO PACK: 40.0000 mg | PACK | Freq: Every day | ORAL | Status: AC

## 2018-07-23 MED ORDER — ALBUTEROL SULFATE (2.5 MG/3ML) 0.083% IN NEBU: 2.5000 mg | INHALATION_SOLUTION | RESPIRATORY_TRACT | 12 refills | Status: AC | PRN

## 2018-07-23 MED ORDER — DILTIAZEM HCL-DEXTROSE 100-5 MG/100ML-% IV SOLN (PREMIX): 5.0000 mg/h | INTRAVENOUS | Status: AC

## 2018-07-23 MED ORDER — ORAL CARE MOUTH RINSE: 15.0000 mL | OROMUCOSAL | 0 refills | Status: AC | PRN

## 2018-07-23 MED ORDER — FENTANYL CITRATE (PF) 100 MCG/2ML IJ SOLN
50.0000 ug | Freq: Once | INTRAMUSCULAR | Status: AC
  Administered 2018-07-23: 50 ug via INTRAVENOUS
  Filled 2018-07-23: qty 2

## 2018-07-23 MED ORDER — FENTANYL BOLUS VIA INFUSION
50.0000 ug | INTRAVENOUS | Status: DC | PRN
  Filled 2018-07-23: qty 50

## 2018-07-23 MED ORDER — ARTIFICIAL TEARS OPHTHALMIC OINT: TOPICAL_OINTMENT | Freq: Three times a day (TID) | OPHTHALMIC | Status: AC

## 2018-07-23 MED ORDER — CHLORHEXIDINE GLUCONATE CLOTH 2 % EX PADS: 6.0000 | MEDICATED_PAD | Freq: Every day | CUTANEOUS | Status: AC

## 2018-07-23 MED ORDER — SODIUM CHLORIDE 0.9 % IV SOLN: 10.0000 mL | INTRAVENOUS | 0 refills | Status: AC | PRN

## 2018-07-23 MED ORDER — METOPROLOL TARTRATE 5 MG/5ML IV SOLN: 5.0000 mg | INTRAVENOUS | Status: AC

## 2018-07-23 MED ORDER — LEVETIRACETAM IN NACL 1000 MG/100ML IV SOLN: 1000.0000 mg | Freq: Two times a day (BID) | INTRAVENOUS | Status: AC

## 2018-07-23 MED ORDER — ZINC TRACE METAL 1 MG/ML IV SOLN
INTRAVENOUS | Status: DC
  Filled 2018-07-23: qty 1125

## 2018-07-23 MED ORDER — VITAL AF 1.2 CAL PO LIQD
1500.0000 mL | ORAL | Status: DC
  Filled 2018-07-23 (×2): qty 1500

## 2018-07-23 MED ORDER — PHENYLEPHRINE HCL-NACL 40-0.9 MG/250ML-% IV SOLN: 0.0000 ug/min | INTRAVENOUS | Status: AC

## 2018-07-23 MED ORDER — LORAZEPAM 2 MG/ML IJ SOLN
0.5000 mg | INTRAMUSCULAR | Status: DC | PRN
  Administered 2018-07-23: 1 mg via INTRAVENOUS
  Filled 2018-07-23: qty 1

## 2018-07-23 MED ORDER — VITAL AF 1.2 CAL PO LIQD: 1500.0000 mL | ORAL | Status: AC

## 2018-07-23 MED ORDER — ENOXAPARIN SODIUM 100 MG/ML ~~LOC~~ SOLN: 1.0000 mg/kg | Freq: Two times a day (BID) | SUBCUTANEOUS | Status: DC

## 2018-07-23 MED ORDER — SODIUM CHLORIDE 0.9 % IV SOLN: 100.0000 mg | Freq: Two times a day (BID) | INTRAVENOUS | Status: AC

## 2018-07-23 MED ORDER — FENTANYL CITRATE (PF) 100 MCG/2ML IJ SOLN: 50.0000 ug | INTRAMUSCULAR | 0 refills | Status: AC | PRN

## 2018-07-23 MED ORDER — FENTANYL 2500MCG IN NS 250ML (10MCG/ML) PREMIX INFUSION
0.0000 ug/h | INTRAVENOUS | Status: DC
  Administered 2018-07-23: 50 ug/h via INTRAVENOUS
  Filled 2018-07-23: qty 250

## 2018-07-23 MED ORDER — ENOXAPARIN SODIUM 100 MG/ML ~~LOC~~ SOLN: 1.0000 mg/kg | Freq: Two times a day (BID) | SUBCUTANEOUS | Status: AC

## 2018-07-23 MED ORDER — RIFAXIMIN 550 MG PO TABS: 550.0000 mg | ORAL_TABLET | Freq: Three times a day (TID) | ORAL | Status: AC

## 2018-07-23 MED ORDER — INSULIN ASPART 100 UNIT/ML ~~LOC~~ SOLN: 0.0000 [IU] | SUBCUTANEOUS | 11 refills | Status: AC

## 2018-07-23 MED ORDER — SODIUM CHLORIDE 0.9% FLUSH
10.0000 mL | INTRAVENOUS | Status: AC | PRN
Start: 2018-07-23 — End: ?

## 2018-07-23 MED ORDER — DILTIAZEM 12 MG/ML ORAL SUSPENSION
60.0000 mg | Freq: Four times a day (QID) | ORAL | Status: DC
  Administered 2018-07-23: 60 mg via ORAL
  Filled 2018-07-23 (×2): qty 6

## 2018-07-23 MED ORDER — ONDANSETRON HCL 4 MG/2ML IJ SOLN: 4.0000 mg | Freq: Four times a day (QID) | INTRAMUSCULAR | 0 refills | Status: AC | PRN

## 2018-07-23 MED ORDER — SODIUM CHLORIDE 0.9% FLUSH: 10.0000 mL | Freq: Two times a day (BID) | INTRAVENOUS | Status: AC

## 2018-07-23 MED ORDER — LORAZEPAM 2 MG/ML IJ SOLN: 0.5000 mg | INTRAMUSCULAR | 0 refills | Status: AC | PRN

## 2018-07-23 MED ORDER — ERYTHROMYCIN ETHYLSUCCINATE 200 MG/5ML PO SUSR: 200.0000 mg | Freq: Three times a day (TID) | ORAL | 0 refills | Status: AC

## 2018-07-23 MED ORDER — POTASSIUM CHLORIDE 10 MEQ/50ML IV SOLN
10.0000 meq | INTRAVENOUS | Status: AC
  Administered 2018-07-23 (×2): 10 meq via INTRAVENOUS
  Filled 2018-07-23 (×2): qty 50

## 2018-07-23 NOTE — Progress Notes (Signed)
eLink Physician-Brief Progress Note Patient Name: Brandi [name] DOB: Oct 11, 1956 MRN: [id number]   Date of Service  07/22/2018  HPI/Events of Note  Pain and agitation  eICU Interventions  Resume Fentanyl infusion, shorten interval between prn doses of Ativan to Q 2 hrs        [name] 08/04/2018, 4:45 AM

## 2018-07-23 NOTE — Care Management Note (Signed)
Case Management Note  Patient Details  Name: Brandi [name] MRN: [id number] Date of Birth: 1956/09/18  Subjective/Objective: Presentswith weakness, decreased intake and lethargy. 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 She developed AMS >small infarct thought not related to AMS. The patient deteriorated on 7/15 with a rise in LFT's, AKI, confusion/AMS, Transferred to ICU  7/30 Tomi Bamberger RN, BSN - extubated, reintubated- has perforation, had cardiac arrest, emergently taken to OR on 7/28, septic shock and metabolic acidoss with open abd wound with vac. Discussed in LOS, will be a good ltach candidate.    7/31 Tomi Bamberger RN, BSN - NCM spoke with Brandi [name] and he states they also think when patient stablizes would be good ltach candidate.  Reps with [facility] and [facility] both can offer beds .   8/1 Tomi Bamberger RN, BSN - Patient spouse chose [facility], referral given to [facility] with [facility] to call and speak with him tomorrow.  Patient will probably need trach before going to ltach.  8/7 Tomi Bamberger RN, BSN- patient received trach today, plan for ltach tomorrow.   8/8 Tomi Bamberger RN, BSN - DC to [facility] today.                    Action/Plan: DC to [facility] today 8/8.   Expected Discharge Date:  (unknown)               Expected Discharge Plan:  Long Term Acute Care (LTAC)  In-House Referral:  Clinical Social Work  Discharge planning Services  CM Consult  Post Acute Care Choice:  Long Term Acute Care (LTAC) Choice offered to:  Spouse  DME Arranged:    DME Agency:     HH Arranged:    [place] Agency:     Status of Service:  Completed, signed off  If discussed at [employer], dates discussed:    Additional Comments:  [name], RN 07/18/2018, 12:42 PM

## 2018-07-23 NOTE — Progress Notes (Signed)
Progress Note  Patient Name: Brandi [name] Date of Encounter: 08/11/2018  Primary Cardiologist: Mertie Moores, MD   Subjective   62 year old female who was admitted with abdominal pain and was eventually found to have a perforated bowel.  He has had numerous complications since I last saw her in early July.  Right arm thrombosis  and also possibly in her left arm.  Very limited venous access. Is unresponsive. Inpatient Medications    Scheduled Meds: . artificial tears   Both Eyes Q8H  . chlorhexidine gluconate (MEDLINE KIT)  15 mL Mouth Rinse BID  . Chlorhexidine Gluconate Cloth  6 each Topical Daily  . diltiazem  60 mg Oral Q6H  . enoxaparin (LOVENOX) injection  1 mg/kg Subcutaneous Q12H  . erythromycin ethylsuccinate  200 mg Per Tube Q8H  . insulin aspart  0-20 Units Subcutaneous Q4H  . lactulose  20 g Oral BID  . mouth rinse  15 mL Mouth Rinse 10 times per day  . metoprolol tartrate  5 mg Intravenous Q4H  . pantoprazole sodium  40 mg Per Tube Daily  . rifaximin  550 mg Oral TID  . sodium chloride flush  10-40 mL Intracatheter Q12H   Continuous Infusions: . sodium chloride Stopped (07/21/2018 0815)  . diltiazem (CARDIZEM) infusion 15 mg/hr (07/29/2018 1000)  . feeding supplement (VITAL AF 1.2 CAL) 25 mL/hr at 07/30/2018 0956  . lacosamide (VIMPAT) IV 100 mg (08/07/2018 1002)  . levETIRAcetam Stopped (07/18/2018 0007)  . phenylephrine (NEO-SYNEPHRINE) Adult infusion 60 mcg/min (07/26/2018 1000)  . piperacillin-tazobactam (ZOSYN)  IV 12.5 mL/hr at 07/21/2018 1000  . TPN ADULT (ION) 75 mL/hr at 08/13/2018 1000  . TPN ADULT (ION)     PRN Meds: sodium chloride, albuterol, fentaNYL (SUBLIMAZE) injection, LORazepam, [DISCONTINUED] ondansetron **OR** ondansetron (ZOFRAN) IV, sodium chloride flush   Vital Signs    Vitals:   07/25/2018 0830 07/31/2018 0900 07/18/2018 0930 07/29/2018 1100  BP: (!) 58/49 92/79 119/90 (!) 121/94  Pulse: 98 (!) 114 (!) 110 89  Resp: (!) 21 (!) 26 (!) 28 (!) 29    Temp:      TempSrc:      SpO2: 99% 100% 98% 99%  Weight:      Height:        Intake/Output Summary (Last 24 hours) at 07/30/2018 1117 Last data filed at 07/26/2018 1005 Gross per 24 hour  Intake 4063 ml  Output 1810 ml  Net 2253 ml   Filed Weights   07/22/18 0530 07/22/18 1220 08/04/2018 0430  Weight: 91.3 kg 89.9 kg 93.1 kg    Telemetry     Afib with RVR .  Personally Reviewed  ECG       Physical Exam   GEN:  Middle-aged female.   Anasarca,   Pale unresponsive,  Trach in place  Cardiac:  Irregularly irregular.  She is tachycardic. Respiratory:  Currently on the ventilator. GI:  poor  bowel sounds. MS:  Her skin is pale yellow.  She has marked edema/anasarca.  Extremities are cool.. Neuro:   She is unresponsive. Psych: Unable to assess  Labs    Chemistry Recent Labs  Lab 07/18/18 0930  07/20/18 0341 07/21/18 0538 07/22/18 0329 07/22/2018 0409  NA 147*   < > 150* 150* 147* 146*  K 3.1*   < > 2.9* 4.0 3.1* 3.7  CL 111   < > 113* 112* 109 113*  CO2 29   < > _0 GLUCOSE 202*   < >  179* 164* 160* 202*  BUN 53*   < > 47* 52* 58* 61*  CREATININE 0.99   < > 0.72 0.81 0.87 0.83  CALCIUM 7.7*   < > 7.8* 7.9* 7.9* 7.9*  PROT 3.7*  --  3.8*  --   --  3.6*  ALBUMIN 1.6*  --  1.6*  --   --  1.4*  AST 72*  --  74*  --   --  55*  ALT 50*  --  60*  --   --  55*  ALKPHOS 710*  --  505*  --   --  164*  BILITOT 10.1*  --  8.4*  --   --  7.6*  GFRNONAA 60*   < > >60 >60 >60 >60  GFRAA >60   < > >60 >60 >60 >60  ANIONGAP 7   < > _0 < > = values in this interval not displayed.     Hematology Recent Labs  Lab 07/21/18 0421 07/21/18 1341 07/22/18 0329 08/09/2018 0409  WBC 34.8*  --  32.8* 23.0*  RBC 2.83*  --  3.40* 3.28*  HGB 7.0* 9.5* 9.0* 8.7*  HCT 23.8* 30.5* 28.6* 28.2*  MCV 84.1  --  84.1 86.0  MCH 24.7*  --  26.5 26.5  MCHC 29.4*  --  31.5 30.9  RDW 32.3*  --  28.7* 30.5*  PLT 108*  --  109* 105*    Cardiac EnzymesNo results for input(s):  TROPONINI in the last 168 hours. No results for input(s): TROPIPOC in the last 168 hours.   BNPNo results for input(s): BNP, PROBNP in the last 168 hours.   DDimer No results for input(s): DDIMER in the last 168 hours.   Radiology    Dg Chest Port 1 View  Result Date: 08/10/2018 CLINICAL DATA:  Respiratory failure EXAM: PORTABLE CHEST 1 VIEW COMPARISON:  07/22/2018 and prior radiograph FINDINGS: Tracheostomy tube, RIGHT IJ central venous catheter with tip overlying the LOWER SVC and RIGHT PICC line with tip overlying the SUPERIOR cavoatrial junction again noted. Small bore feeding tube in NG tube enter the stomach with tip soft the field of view. Pulmonary vascular congestion, bilateral pleural effusions and bibasilar atelectasis again noted. The cardiomediastinal silhouette is unchanged. Improved LEFT LOWER lung aeration noted with slightly decreased RIGHT lung volumes. IMPRESSION: Improved LEFT LOWER lung aeration and slightly decreased RIGHT lung volumes. No other significant change. Electronically Signed   By: Margarette Canada M.D.   On: 08/04/2018 10:54   Dg Chest Port 1 View  Result Date: 07/22/2018 CLINICAL DATA:  Tracheostomy tube placement. EXAM: PORTABLE CHEST 1 VIEW COMPARISON:  07/21/2018, 07/20/2018 and earlier. FINDINGS: Tracheostomy tube tip in satisfactory position below the thoracic inlet projecting approximately 3 cm above the carina. RIGHT jugular central venous catheter tip projects over the mid to LOWER SVC, unchanged. RIGHT arm PICC tip projects over the mid to LOWER SVC, unchanged. Nasogastric tube tip in the fundus of the stomach. Feeding tube courses through the stomach into the duodenum though its tip is not included on the image. Markedly suboptimal inspiration with consolidation in the lower lobes, LEFT greater than RIGHT, though aeration in the RIGHT lower lobe has improved since yesterday. No new pulmonary parenchymal abnormalities. IMPRESSION: 1. Tracheostomy tube tip in  satisfactory position projecting approximately 3 cm above the carina. 2. Remaining support apparatus satisfactory. Markedly suboptimal inspiration. Dense BILATERAL lower lobe atelectasis and/or pneumonia, LEFT greater than RIGHT, with improved aeration  in the [place] since yesterday. 3. No new abnormalities. These results will be called to the ordering clinician or representative by the Radiologist Assistant, and communication documented in the PACS or zVision Dashboard. Electronically Signed   By: Evangeline Dakin M.D.   On: 07/22/2018 15:00   [place] Ekg Site Rite  Result Date: 07/28/2018 If Site Rite image not attached, placement could not be confirmed due to current cardiac rhythm.   Cardiac Studies     Patient Profile     62 y.o. female multiorgan failure.  We are asked to see her again for atrial fib   Assessment & Plan    1.  Atrial fibrillation: Patient has atrial  fibrillation that was first diagnosed in early July.  She has multiorgan failure is likely contributing to her rapid atrial fibrillation At this point I think would be difficult/impossible to convert her back to sinus rhythm.  I think her best strategy is for rate control and anticoagulation if possible.  Continue IV diltiazem and IV metoprolol as tolerated/as needed.  She is still drip for pressure support.  Her blood pressure is marginal and she still requires phenylephrine drip  [facility]   Prognosis remains very poor.  [facility] will sign off.   Medication Recommendations:  Rate control and anticoagulation  Other recommendations (labs, testing, etc):   Follow up as an outpatient:   .         For questions or updates, please contact [place] Please consult [email or web address] for contact info under Cardiology/STEMI.      Signed, Mertie Moores, MD  08/02/2018, 11:17 AM

## 2018-07-23 NOTE — Progress Notes (Signed)
ANTICOAGULATION CONSULT NOTE - Follow Up Consult  Pharmacy Consult for Heparin Indication: atrial fibrillation/DVT  No Known Allergies  Patient Measurements: Height: 5' 2.01" (157.5 cm) Weight: 205 lb 4 oz (93.1 kg) IBW/kg (Calculated) : 50.12 Heparin Dosing Weight: 70 kg  Vital Signs: Temp: 100.3 F (37.9 C) (08/08 0545) Temp Source: Axillary (08/08 0545) BP: 111/56 (08/08 0600) Pulse Rate: 87 (08/08 0600)  Labs: Recent Labs    07/21/18 0421 07/21/18 0538 07/21/18 1341 07/21/18 2045 07/22/18 0329 07/29/2018 0409  HGB 7.0*  --  9.5*  --  9.0* 8.7*  HCT 23.8*  --  30.5*  --  28.6* 28.2*  PLT 108*  --   --   --  109* 105*  LABPROT 19.3*  --   --   --  17.2* 16.7*  INR 1.64  --   --   --  1.41 1.36  HEPARINUNFRC  --   --   --  0.19* <0.10* 0.19*  CREATININE  --  0.81  --   --  0.87 0.83    Estimated Creatinine Clearance: 74.7 mL/min (by C-G formula based on SCr of 0.83 mg/dL).  Assessment:   62 yo F with initial onset Afib this admission briefly started on heparin before transitioning to apixaban on 7/14. Anticoagulation had been held since 7/15 due to GI bleed. Restarted heparin gtt 8/7 per CCM, currently on hold due to planned trach placement.  She is also noted with RUE DVT (no interventions planned per VVS) -Heparin level = 0.19 (heparin was restarted 8/7 at 9pm post trach)  Goal of Therapy:  Heparin level 0.3-0.7 units/ml Monitor platelets by anticoagulation protocol: Yes   Plan:  -No bolus with recent procedure -Increase heparin to 1300 units/hr -Heparin level in 6 hours and daily wth CBC daily  Hildred Laser, PharmD Clinical Pharmacist Please check Amion for pharmacy contact number

## 2018-07-23 NOTE — Progress Notes (Signed)
eLink Physician-Brief Progress Note Patient Name: Brandi [name] DOB: 1956/05/17 MRN: [id number]   Date of Service  08/08/2018  HPI/Events of Note  Hypokalemia  eICU Interventions  KCL 10 meq iv bolus Q 1 hr x 2        Brandi [name] 07/30/2018, 6:16 AM

## 2018-07-23 NOTE — Progress Notes (Signed)
Shanon Brow RN from [place] called report to [facility] - gave report to [facility]

## 2018-07-23 NOTE — Procedures (Signed)
Central Venous Catheter Insertion Procedure Note Brandi [name] [id number] 1956-06-15  Procedure: Insertion of Central Venous Catheter Indications: Assessment of intravascular volume, Drug and/or fluid administration and Frequent blood sampling  Procedure Details Consent: Unable to obtain consent because of altered level of consciousness. Time Out: Verified patient identification, verified procedure, site/side was marked, verified correct patient position, special equipment/implants available, medications/allergies/relevent history reviewed, required imaging and test results available.  Performed  Maximum sterile technique was used including antiseptics, cap, gloves, gown, hand hygiene, mask and sheet. Skin prep: Chlorhexidine; local anesthetic administered A antimicrobial bonded/coated triple lumen catheter was placed in the left internal jugular vein using the Seldinger technique.  Evaluation Blood flow good Complications: No apparent complications Patient did tolerate procedure well. Chest X-ray ordered to verify placement.  CXR: pending.  Procedure performed under direct ultrasound guidance for real time vessel cannulation.      Montey Hora, [place] [facility] Pager: [phone]  or [phone] 08/02/2018, 12:15 PM

## 2018-07-23 NOTE — Progress Notes (Addendum)
PHARMACY - ADULT TOTAL PARENTERAL NUTRITION CONSULT NOTE   Pharmacy Consult: TPN Indication: Post-operative ileus  Patient Measurements: Height: 5' 2.01" (157.5 cm) Weight: 205 lb 4 oz (93.1 kg) IBW/kg (Calculated) : 50.12 TPN AdjBW (KG): 60.3 Body mass index is 37.53 kg/m. 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 On 7/15, patient's clinical status worsened and increased confusion and multiorgan failure including respiratory failure requiring intubation. Patient improved and was extubated 7/25. However, patient had subsequent cardiac arrest on 7/28 and found to have perforation of descending colon and near perforation of cecum requiring ileocecectomy and partial colectomy with discontinuity and open abdomen. She returned to the OR on 7/30 with creation of end ileostomy and closure of abdomen. Patient has been NPO >7 days and with limited nutrition since admission (22 days); therefore, is at high risk for refeeding.    GI: prealbumin improved to 8.4, LBM 8/7, NG O/P 662m (8/7) (down), drain O/P 1536m(down), ileostomy O/P 40022mup).  8/5 CT showed gas and pneumotosis, s/p ex-lap.  Mult attempt to titrate TF to goal and failed, now at 71m31m and tolerating thus far.  PPI PT, erythromycin Endo: no hx DM - CBGs controlled Insulin requirements in the past 24 hours: 18 units + 40 units in TPN Lytes: Sodium removed -  K 3.7 (supplemented), others WNL Renal: SCr 0.83 stable, BUN 61 - UOP 0.5 ml/kg/hr (+2.2L/24hr) Pulm: FiO2 40% - getting trach today Cards: MAP 70-80s, EF 30-35%, CVP 7, Afib with HR 80-90s on IV metoprolol, diltiazem gtt, back on phenylephrine Hepatobil: AST/ALT trending down 55/55, alk phos down 505>>164, tbili down 8.4>>7.6. Last TG 197 Neuro: HE - lactulose/rifaximin.  Sedated on Fentanyl. Sz history on Vimpat + Keppra.  GCS 9, CPOT 0-4, RASS -1 ID:  Zosyn for IAI - Tmax 101.3, WBC down 23, LA down 1.8 TPN Access: PICC placed 07/10/18 TPN start date: 07/16/18  Nutritional Goals (per RD rec on 8/5): 1658 kCal, 110-130gm protein and >/= 1.7L fluid per day  Current Nutrition:  TPN    Plan:   Increase TPN to 75 ml/hr given frequent TF interruptions.  TF to resume post trach today. TPN will provide 113g AA and 270g CHO and 28.8gm Lipids for a total of 1656 kCal, meeting >100% of needs Electrolytes in TPN: Continue current electrolyte regimen - Expect phos. To trend higher with TF now onboard - will check tomorrow and if still down, adjust some in her TPN. Daily multivitamin in TPN Remove trace elements d/t elevated tbili - add back zinc 5mg,72mromium 10 mcg, and selenium 60 mcg Continue resistant SSI Q4H + 40 units regular insulin in TPN F/U AM labs, CBGs, volume status   [name]., MS Clinical Pharmacist Pager:  [phone] you for allowing pharmacy to be part of this patients care team. 07/22/2018, 9:28 AM

## 2018-07-23 NOTE — Discharge Summary (Addendum)
Physician Discharge Summary  Patient ID: Brandi [name] MRN: [id number] DOB/AGE: 1955-12-24 62 y.o.  Admit date: 06/23/2018 Discharge date: 08/01/2018                           DISCHARGE PLAN BY DIAGNOSIS    Perforated descending colon - s/p exlap x 3 with creation end ileostomy. Nutrition. P: General surgery following / managing. Continue TF's, erythromycin (for motility) per surgery. Continue TPN - will need to restart once at [facility].  Acute Hypoxic Respiratory Failure - in setting of bowel perforation, shock.  S/p trach 8/7 Bilateral Pleural Effusions - suspect in setting of liver disease, HF, AKI Klebsiella pneumoniae - s/p 10 days zosyn. P: Maintain on full vent support. Start trach weaning trials as able if mental status allows. Bronchial hygiene. Follow CXR intermittently.  HFrEF - LVEF 30%. AF with RVR - improved with cardizem and lopressor. Hypotension - 7/28, suspect related to intravascular volume depletion, sedation. RUE and LUE DVT. Hx HTN. P: Continue lopressor 47m q 6.Continue cardizem gtt at 10 mg/hr. Neo for MAP > 65 at 140 mcg. Continue lovenox, transition to eliquis if can eventually pass swallow eval. Hold diureses while hemodynamically unstable.  Acute metabolic & hepatic encephalopathy / acute delirium. CVA - Right Frontal Operculum infarct - felt related to AF/embolic but not all cause of mental status. Seizure disorder - EEG 8/2 no seizures Tardive Dyskinesia - improved 7/28. Failure to thrive. Hx schizophrenia P: Continue lovenox, transition to eliquis if can eventually pass swallow eval. Continue Keppra, Vimpat, Ativan. Continue low dose fentanyl PRN pain. Recommend palliative care involvement for goals of care. Depending on her course, f/u with neurology PRN (or as outpatient if can ever get discharged).  AGMA - in setting of bowel perforation, shock.  Resolved. P: Follow BMP. Correct electrolytes as indicated.  Nash Cirrhosis with  Hepatic Encephalopathy. P: Continue rifaximin, lactulose.  RUE and LUE DVT. Coagulopathy - resolved. Anemia - stable. Thrombocytopenia - stable. P: Continue lovenox, transition to eliquis if can eventually pass swallow eval. Transfuse for Hgb < 7. Monitor platelet counts. Monitor for bleeding. Follow CBC.  DM 2 P: Continue sliding-scale insulin adjusted per pharm in TPN  Hx Breast Cancer P: Follow-up as outpatient                DISCHARGE SUMMARY   Brandi JANICEis a 62y.o. y/o female [facility], nausea, vomiting, decreased oral intake and weakness.  She was reportedly evaluated by her PCP prior to admit with elevated LFT's (AST 302, ALT 611, AP 380).  Follow up CT of the abdomen demonstrated prominent caudate lobe and surface irregularity of the liver.   In the ER, [place] confirmed the CT findings of the liver.  She was noted to be lethargic and have asterixis on exam.  Ammonia 22 (7/10) > 38 (7/12).  The patient had AFwRVR and was treated with cardizem.  She was admitted for further evaluation.  The patient was seen by Cardiology and transitioned to metoprolol for rate control due to decreased LVEF (30-35%). Labs showed a microcytic anemia with ferritin of 9.  GI was consulted for evaluation with recommendations for lactulose and eventual endoscopic evaluation.  She had ongoing encephalopathy and a CT of the head was obtained which showed a right frontal operculum CVA.  Neurology evaluated the patient and felt the stroke was likely related to AF but  not the cause of her mental status.  She was recommended anticoagulation and passed a swallow evaluation converting from heparin to eliquis.  On 7/15, her confusion, tardive dyskinesia symptoms and lethargy worsened as well as her LFT's (AST 3942, ALT 1976, ALK Phos 404), renal function and WBC.  PCCM consulted for evaluation.  Anticoagulation stopped due to shock liver.  PSY meds  were held due to severe tardive dyskinesia and liver dysfunction.  She had prolonged intubation due to poor mental status.  The patient was extubated on 7/25 without difficulty.  She was transitioned to [facility] on 7/26.  She developed agitated delirium overnight 7/26 and precedex was initiated by [facility].  PCCM called back 7/28 to assess patient as she had coffee ground emesis overnight, concern for aspiration, hypotension that responded to 555m saline bolus x2.  As day progressed on 7/28, she had progressive abdominal pain.  CT of the abd/pelvis demonstrated bowel perforation.  She was intubated, had brief arrest post intubation (9 min) and was taken to the OR.  Returned to ICU on epi, neo, vaso, levo, bicarb.  7/30 she was taken back to OR for closure of abdomen and creation of end ileostomy with partial colectomy.  TPN was started the following day while awaiting bowel function to return.  Trickle feeds were also started and advanced. 8/4 she had A.fib with RVR for which she was treated with cardizem and lopressor. On 8/5, she had worsening leukocytosis.  There was concern for abscess or ischemia so had repeat CT scan of abdomen.  This showed pneumatosis; therefore, she was subsequently taken back to OR for re-exploration of ex-lap.  This was negative for abscess or bowel ischemia. On 8/7, she had tracheostomy performed and tolerated well.  She was continued on full vent support following trach.  Later that day, she had UE venous duplex that preliminary showed new DVT in right subclavian and brachial veins and left subclavian vein (official read pending).  She also had changes to right LLE with concern for ischemia.  Vascular surgery was consulted overnight and felt that discoloration and coolness were secondary to vasopressors.  They recommended continuing heparin given no surgical intervention required.  Right PICC and IJ were removed 8/8 and new left IJ CVL was placed under [place].  On 8/8, she was  deemed medically stable [facility].   She has poor prognosis and this was relayed to family by multiple members of her care team from various specialty services.            SIGNIFICANT EVENTS 7/09  Admit  7/15  PCCM consulted with AMS, acute liver failure 7/20   Early in the a.m. hours drop blood pressure.  Placed on pressors.  Non-anion gap acidosis noted.  Cortisol 29.2 CBC remains elevated blood cell count remains elevated.  7/23  800 ml in flexiseal overnight.  Off home PSY meds. Mg/K low.  Na 153 on free water. Continues to have tardive dyskinesia.  7/26  extubated on 7/25. Remains encephalopathic. NA still elevated  7/28  PCCM called back for hypotension on precedex, coffee ground emesis. Concern for aspiration, hypotension that responded to 5040msaline bolus x2.  TF held overnight with nausea/abd pain.  NGT placed due to vomiting. ABD pain increased > CT abd with bowel perforation > to OR for resection/wash out Wound vac, /ileostomy, remains intubated 7/30 partial colectomy with closure of abdomen and creation of end ileostomy  7/31 TPN started 8/4 AFRVR, started on cardizem 8/5 re-exploration of ex lap 8/7 tracheostomy  SIGNIFICANT DIAGNOSTIC STUDIES [place] ABD 7/6 >> possible cirrhosis, gallbladder surgically absent CT Head 7/9 >> atrophy with small vessel chronic ischemic changes of deep cerebral white matter CT Renal Study 7/9 >> no acute findings, bilateral non-obstructing renal stones ECHO 7/9 >> LV mildly dilated, wall thickness was increased in a pattern of moderate LVH, LVEF 30-35%, severe hypokinesis of the anteroseptal, anterior, inferoseptal & apical myocardium, mild to moderate MV regurgitation, LA severely dilated, RV moderately dilated, RA mod-severely dilated, PA pressure 6mHg, trivial pericardial effusion. CTA Head, Neck 7/12 >> minimal calcified plaque at the carotid bifurcation regions but  no stenosis or irregularity, no large vessel occlusions, unable to specifically identify the missing branch vessel responsible for the right posterior frontal infarction but this is presumably an occluded M3 or distal branch CT Head w/o 7/12 >> the small right middle cerebral artery distribution, posterolateral frontal lobe infarct has evolved since the prior CT, it is now seen as a well defined area of hypoattenuation, no new areas of infarct, no ICH CT head 7/15 >> Redemonstration of right posterior frontal lobe infarct. No acute intracranial hemorrhage or new infarct identified.  Atrophy with chronic small vessel ischemia. CT abd/pelvis 7/18 > Diffuse dilation of the colon with large amount of ascending colonic stool.  CT ABD/Pelvis 7/28 >> free air in the abd consistent with bowel rupture, unable to identify source, bilateral pleural effusions (stable), non-obstructive kidney stones EEG 8/2 >no seizure act  CT abd/ pelvis 8/5 > Small bowel pneumatosis. Air within draining veins of the small bowel, the superior mesenteric vein, and intrahepatic branches of the portal vein. Marked gastric wall thickening in the fundus of the stomach. Suspected right femoral DVT.  Marked ascites. UE venous duplex 8/7 >   MICRO DATA  BCx2 7/10 >> negative  Cdiff 7/20 >> negative  Sputum 7/20 >> negative Sputum 7/29>>Kleb Pne SS zoysn BC 7/28 >neg  BC 8/4 >> 8/5 MRSA PCR >> neg  ANTIBIOTICS Vanco 7/15 >> 7/21 Zoysn 7/15 >> 7/22 Unasyn 7/28 >> 7/28 Diflucan 7/28 >> 7/31 Zosyn 7/28 >> 8/8 Vanco 7/28 >>7/31  CONSULTS Cardiology Gastroenterology Neurology Vascular  TUBES / LINES ETT 7/15 >> 7/25 7/16 Foley >>  R DL PICC 7/26 >> 8/8 ETT 7/28 >> 8/7 L Rad Aline 7/28 >> out R IJ dual lumen 7/28 >> 8/8 Trach 8/7 >  L IJ CVL 8/8 >    Discharge Exam: General: Adult female, chronically ill appearing. Neuro: Non-interactive. HEENT: [place]/AT. Sclerae anicteric.  Trach C/D/I. Cardiovascular: IRIR, no  M/R/G.  Lungs: Respirations even and unlabored.  CTA bilaterally, No W/R/R. Abdomen: Obese, colostomy in place with normal colored stool.  BS hypoactive. Abd soft, NT/ND.  Musculoskeletal: No gross deformities, no edema.  Skin: Intact, warm, no rashes.   Vitals:   08/05/2018 1230 08/03/2018 1300 07/22/2018 1400 07/18/2018 1430  BP: (!) 83/54 (!) 81/52 136/85 (!) 130/117  Pulse: 86 83 90 95  Resp: (!) 23 (!) 29 (!) 29 (!) 26  Temp:      TempSrc:      SpO2: 99% 100% 100% 99%  Weight:      Height:         Discharge Labs  BMET Recent Labs  Lab 07/19/18 0524 07/19/18 1138 07/20/18 0341 07/21/18 0538 07/22/18 0329 07/16/2018 0409  NA 147* 149* 150* 150* 147* 146*  K 5.4* 3.4* 2.9* 4.0 3.1* 3.7  CL 109  110 113* 112* 109 113*  CO2 32 '31 30 29 29 26  ' GLUCOSE 177* 207* 179* 164* 160* 202*  BUN 48* 46* 47* 52* 58* 61*  CREATININE 0.74 0.80 0.72 0.81 0.87 0.83  CALCIUM 7.5* 7.7* 7.8* 7.9* 7.9* 7.9*  MG 1.9  --  1.6* 1.9 2.0 2.0  PHOS 3.0  --  2.4* 3.5 3.3 2.6    CBC Recent Labs  Lab 07/21/18 0421 07/21/18 1341 07/22/18 0329 08/12/2018 0409  HGB 7.0* 9.5* 9.0* 8.7*  HCT 23.8* 30.5* 28.6* 28.2*  WBC 34.8*  --  32.8* 23.0*  PLT 108*  --  109* 105*    Anti-Coagulation Recent Labs  Lab 07/19/18 0336 07/20/18 0341 07/21/18 0421 07/22/18 0329 07/25/2018 0409  INR 1.57 1.41 1.64 1.41 1.36    Discharge Instructions    Ambulatory referral to Neurology   Complete by:  As directed    Follow up with Dr. Jannifer Franklin at [facility] in 4-6 weeks. Pt is Dr. Jannifer Franklin old pt. Thanks.       Follow-up Information    Lenice Pressman. Schedule an appointment as soon as possible for a visit in 4 week(s).   Contact information: [place] [address] [phone]            Allergies as of 08/01/2018   No Known Allergies     Medication List    STOP taking these medications   aspirin 81 MG EC tablet   atorvastatin 20 MG tablet Commonly known  as:  LIPITOR   busPIRone 30 MG tablet Commonly known as:  BUSPAR   calcium-vitamin D 250-125 MG-UNIT tablet Commonly known as:  OSCAL WITH D   dicyclomine 20 MG tablet Commonly known as:  BENTYL   LATUDA 120 MG Tabs Generic drug:  Lurasidone HCl   levETIRAcetam 500 MG tablet Commonly known as:  KEPPRA   lisinopril 5 MG tablet Commonly known as:  PRINIVIL,ZESTRIL   LORazepam 0.5 MG tablet Commonly known as:  ATIVAN Replaced by:  LORazepam 2 MG/ML injection   omeprazole 20 MG capsule Commonly known as:  PRILOSEC   ondansetron 4 MG disintegrating tablet Commonly known as:  ZOFRAN-ODT   ondansetron 4 MG tablet Commonly known as:  ZOFRAN Replaced by:  ondansetron 4 MG/2ML Soln injection   ONETOUCH DELICA LANCETS FINE Misc   venlafaxine XR 150 MG 24 hr capsule Commonly known as:  EFFEXOR-XR   VIMPAT 100 MG Tabs Generic drug:  Lacosamide     TAKE these medications   albuterol (2.5 MG/3ML) 0.083% nebulizer solution Commonly known as:  PROVENTIL Take 3 mLs (2.5 mg total) by nebulization every 4 (four) hours as needed for wheezing or shortness of breath.   artificial tears Oint ophthalmic ointment Commonly known as:  LACRILUBE Place into both eyes every 8 (eight) hours.   chlorhexidine gluconate (MEDLINE KIT) 0.12 % solution Commonly known as:  PERIDEX 15 mLs by Mouth Rinse route 2 (two) times daily.   Chlorhexidine Gluconate Cloth 2 % Pads Apply 6 each topically daily.   diltiazem 10 mg/ml  oral suspension Commonly known as:  CARDIZEM Take 6 mLs (60 mg total) by mouth every 6 (six) hours.   dilTIAZem HCl-Dextrose 100-5 MG/100ML-% Soln infusion Inject 5-15 mg/hr into the vein continuous.   enoxaparin 100 MG/ML injection Commonly known as:  LOVENOX Inject 0.95 mLs (95 mg total) into the skin every 12 (twelve) hours.   erythromycin ethylsuccinate 200 MG/5ML suspension Commonly known as:  EES Place  5 mLs (200 mg total) into feeding tube every 8 (eight)  hours.   feeding supplement (VITAL AF 1.2 CAL) Liqd Place 1,500 mLs into feeding tube continuous.   fentaNYL 100 MCG/2ML injection Commonly known as:  SUBLIMAZE Inject 1 mL (50 mcg total) into the vein every hour as needed for severe pain (sedation).   insulin aspart 100 UNIT/ML injection Commonly known as:  novoLOG Inject 0-20 Units into the skin every 4 (four) hours.   lacosamide 100 mg in sodium chloride 0.9 % 25 mL Inject 100 mg into the vein every 12 (twelve) hours.   lactulose 10 GM/15ML solution Commonly known as:  CHRONULAC Take 30 mLs (20 g total) by mouth 2 (two) times daily.   levETIRAcetam 1000 MG/100ML Soln Commonly known as:  KEPPRA Inject 100 mLs (1,000 mg total) into the vein every 12 (twelve) hours. Start taking on:  08-20-18   LORazepam 2 MG/ML injection Commonly known as:  ATIVAN Inject 0.25-0.5 mLs (0.5-1 mg total) into the vein every 2 (two) hours as needed for anxiety. Replaces:  LORazepam 0.5 MG tablet   metoprolol tartrate 5 MG/5ML Soln injection Commonly known as:  LOPRESSOR Inject 5 mLs (5 mg total) into the vein every 4 (four) hours.   mouth rinse Liqd solution 15 mLs by Mouth Rinse route as needed.   ondansetron 4 MG/2ML Soln injection Commonly known as:  ZOFRAN Inject 2 mLs (4 mg total) into the vein every 6 (six) hours as needed for nausea. Replaces:  ondansetron 4 MG tablet   pantoprazole sodium 40 mg/20 mL Pack Commonly known as:  PROTONIX Place 20 mLs (40 mg total) into feeding tube daily. Start taking on:  2018-08-20   phenylephrine 40-0.9 MG/250ML-% Soln Commonly known as:  NEOSYNEPHRINE Inject 0-0.4 mg/min into the vein continuous.   rifaximin 550 MG Tabs tablet Commonly known as:  XIFAXAN Take 1 tablet (550 mg total) by mouth 3 (three) times daily.   sodium chloride 0.9 % infusion Inject 10 mLs into the vein as needed (KVO).   sodium chloride flush 0.9 % Soln Commonly known as:  NS 10-40 mLs by Intracatheter route every 12  (twelve) hours.   sodium chloride flush 0.9 % Soln Commonly known as:  NS 10-40 mLs by Intracatheter route as needed (flush).        Disposition: [facility].  Discharged Condition: AVAH BASHOR has met maximum benefit of inpatient care and is medically stable and cleared for discharge.  Patient is pending follow up as above.      Time spent on disposition:  Greater than 35 minutes.   Montey Hora, [place] Pgr: [phone]  or [phone]

## 2018-07-23 NOTE — Progress Notes (Signed)
Pt discharged to [facility] per bed by 2 RN's and monitored with 1 bag of personal belongings and personal care items including spare trach and trach supplies.Family notified of discharge.

## 2018-07-23 NOTE — Progress Notes (Signed)
Prolonged hospitalization in this 62 year old woman with schizophrenia with NASH and encephalopathy complicated by atrial fibrillation/RVR, bowel perforation requiring ileostomy and colectomy and prolonged ventilation requiring tracheostomy.  She remains on TNA for severe protein calorie malnutrition, Cardizem and heparin for atrial fibrillation , on low-dose fentanyl drip.  She has completed 10 days of Zosyn for Klebsiella and sputum.  No evidence of ischemic bowel on repeat laparoscopy 8/5  Unfortunately she has evidence of thrombosis in the right subclavian vein and right brachial vein where she has a PICC line and a right IJ.  These will need to be discontinued.  We will try to obtain new access on the left side with a picc but left subclavian well is also thrombosed, so she very well may need an IJ. We will discontinue IV heparin and use therapeutic Lovenox in the meantime while we are deciding whether she needs a PEG or not.  Unclear cause of thrombocytopenia, time course reviewed with pharmacy and does not seem to be typical for HIT but will send panel.  [facility] and can be transferred after above procedures done  Prognosis is guarded and she does not liberate from the ventilator, would consider goals of care discussion  My critical care time x 31 m  Rakesh V. Elsworth Soho MD [phone]

## 2018-07-23 NOTE — Progress Notes (Signed)
eLink Physician-Brief Progress Note Patient Name: Brandi [name] DOB: 08-21-1956 MRN: [id number]   Date of Service  07/22/2018  HPI/Events of Note  Respiratory alkalosis  eICU Interventions  Respiratory rate reduced to 15 per minute        Jarelis Ehlert U Tametra Ahart 08/07/2018, 7:03 AM

## 2018-07-23 NOTE — Progress Notes (Signed)
[facility] Progress Note  3 Days Post-Op  Subjective: CC: NAE Pt tolerating TF at 20 cc/h and having increased stool output Still minimally responsive S/p trach yesterday  Objective: Vital signs in last 24 hours: Temp:  [98.4 F (36.9 C)-101.3 F (38.5 C)] 98.9 F (37.2 C) (08/08 0758) Pulse Rate:  [72-150] 103 (08/08 0800) Resp:  [13-32] 25 (08/08 0800) BP: (77-167)/(39-119) 109/93 (08/08 0800) SpO2:  [93 %-100 %] 100 % (08/08 0800) FiO2 (%):  [40 %-100 %] 40 % (08/08 0758) Weight:  [89.9 kg-93.1 kg] 93.1 kg (08/08 0430) Last BM Date: 08/15/2018  Intake/Output from previous day: 08/07 0701 - 08/08 0700 In: 4295.8 [I.V.:3161.6; NG/GT:692.7; IV Piggyback:441.5] Out: 1660 [Urine:1110; Drains:150; Stool:400] Intake/Output this shift: No intake/output data recorded.  PE: UVO:[id number], minimally responsive Neck: trach c/d/i Card:Atrial fibrillation with rate in the 110s Pulm:On the ventilator Abd: Soft,mildlydistended, bowel soundsimproving,midline woundwith VAC present, some skin breakdown to left of dressing, stoma pink with liquid brown stool in ostomy Ext: 4+ edema in LEs bilaterally   Lab Results:  Recent Labs    07/22/18 0329 08/09/2018 0409  WBC 32.8* 23.0*  HGB 9.0* 8.7*  HCT 28.6* 28.2*  PLT 109* 105*   BMET Recent Labs    07/22/18 0329 08/02/2018 0409  NA 147* 146*  K 3.1* 3.7  CL 109 113*  CO2 29 26  GLUCOSE 160* 202*  BUN 58* 61*  CREATININE 0.87 0.83  CALCIUM 7.9* 7.9*   PT/INR Recent Labs    07/22/18 0329 07/20/2018 0409  LABPROT 17.2* 16.7*  INR 1.41 1.36   CMP     Component Value Date/Time   NA 146 (H) 07/28/2018 0409   NA 138 06/22/2018 1350   NA 144 10/24/2014 1336   K 3.7 07/30/2018 0409   K 3.5 10/24/2014 1336   CL 113 (H) 08/03/2018 0409   CL 108 (H) 06/24/2013 0610   CL 103 10/22/2012 1343   CO2 26 08/11/2018 0409   CO2 31 (H) 10/24/2014 1336   GLUCOSE 202 (H) 07/17/2018 0409   GLUCOSE 116  10/24/2014 1336   GLUCOSE 153 (H) 10/22/2012 1343   BUN 61 (H) 08/09/2018 0409   BUN 19 06/22/2018 1350   BUN 14.9 10/24/2014 1336   CREATININE 0.83 08/11/2018 0409   CREATININE 0.77 10/22/2017 1007   CREATININE 0.8 10/24/2014 1336   CALCIUM 7.9 (L) 07/22/2018 0409   CALCIUM 10.7 (H) 10/24/2014 1336   PROT 3.6 (L) 08/06/2018 0409   PROT 5.9 (L) 06/22/2018 1350   PROT 7.6 10/24/2014 1336   ALBUMIN 1.4 (L) 08/15/2018 0409   ALBUMIN 3.8 06/22/2018 1350   ALBUMIN 4.1 10/24/2014 1336   AST 55 (H) 07/17/2018 0409   AST 21 10/24/2014 1336   ALT 55 (H) 07/27/2018 0409   ALT 31 10/24/2014 1336   ALKPHOS 164 (H) 07/21/2018 0409   ALKPHOS 138 10/24/2014 1336   BILITOT 7.6 (H) 07/17/2018 0409   BILITOT 1.1 06/22/2018 1350   BILITOT 0.44 10/24/2014 1336   GFRNONAA >60 07/18/2018 0409   GFRNONAA >60 06/24/2013 0610   GFRAA >60 07/23/2018 0409   GFRAA >60 06/24/2013 0610   Lipase     Component Value Date/Time   LIPASE 37 06/23/2018 0119       Studies/Results: Dg Chest Port 1 View  Result Date: 07/22/2018 CLINICAL DATA:  Tracheostomy tube placement. EXAM: PORTABLE CHEST 1 VIEW COMPARISON:  07/21/2018, 07/20/2018 and earlier. FINDINGS: Tracheostomy tube tip in satisfactory position below the thoracic inlet projecting approximately  3 cm above the carina. RIGHT jugular central venous catheter tip projects over the mid to LOWER SVC, unchanged. RIGHT arm PICC tip projects over the mid to LOWER SVC, unchanged. Nasogastric tube tip in the fundus of the stomach. Feeding tube courses through the stomach into the duodenum though its tip is not included on the image. Markedly suboptimal inspiration with consolidation in the lower lobes, LEFT greater than RIGHT, though aeration in the RIGHT lower lobe has improved since yesterday. No new pulmonary parenchymal abnormalities. IMPRESSION: 1. Tracheostomy tube tip in satisfactory position projecting approximately 3 cm above the carina. 2. Remaining support  apparatus satisfactory. Markedly suboptimal inspiration. Dense BILATERAL lower lobe atelectasis and/or pneumonia, LEFT greater than RIGHT, with improved aeration in the RIGHT LOWER LOBE since yesterday. 3. No new abnormalities. These results will be called to the ordering clinician or representative by the Radiologist Assistant, and communication documented in the PACS or zVision Dashboard. Electronically Signed   By: Evangeline Dakin M.D.   On: 07/22/2018 15:00    Anti-infectives: Anti-infectives (From admission, onward)   Start     Dose/Rate Route Frequency Ordered Stop   07/21/18 1000  erythromycin ethylsuccinate (EES) 200 MG/5ML suspension 200 mg  Status:  Discontinued     200 mg Oral Every 8 hours 07/21/18 0906 07/21/18 0954   07/21/18 1000  erythromycin ethylsuccinate (EES) 200 MG/5ML suspension 200 mg     200 mg Per Tube Every 8 hours 07/21/18 0954     07/17/18 1600  rifaximin (XIFAXAN) tablet 550 mg     550 mg Oral 3 times daily 07/17/18 1254     07/14/18 0600  ceFAZolin (ANCEF) IVPB 2g/100 mL premix     2 g 200 mL/hr over 30 Minutes Intravenous On call to O.R. 07/13/18 2234 07/14/18 1052   07/13/18 2100  fluconazole (DIFLUCAN) IVPB 200 mg  Status:  Discontinued     200 mg 100 mL/hr over 60 Minutes Intravenous Every 24 hours 07/12/18 1958 07/15/18 1224   07/13/18 1800  vancomycin (VANCOCIN) IVPB 1000 mg/200 mL premix  Status:  Discontinued     1,000 mg 200 mL/hr over 60 Minutes Intravenous Every 24 hours 07/12/18 1549 07/15/18 1224   07/13/18 0100  piperacillin-tazobactam (ZOSYN) IVPB 3.375 g     3.375 g 12.5 mL/hr over 240 Minutes Intravenous Every 8 hours 07/12/18 1549     07/12/18 2100  fluconazole (DIFLUCAN) IVPB 400 mg     400 mg 100 mL/hr over 120 Minutes Intravenous  Once 07/12/18 1958 07/12/18 2306   07/12/18 1600  vancomycin (VANCOCIN) 1,500 mg in sodium chloride 0.9 % 500 mL IVPB     1,500 mg 250 mL/hr over 120 Minutes Intravenous STAT 07/12/18 1549 07/12/18 1829    07/12/18 1600  piperacillin-tazobactam (ZOSYN) IVPB 3.375 g     3.375 g 100 mL/hr over 30 Minutes Intravenous STAT 07/12/18 1549 07/12/18 1701   07/12/18 0800  ampicillin-sulbactam (UNASYN) 1.5 g in sodium chloride 0.9 % 100 mL IVPB  Status:  Discontinued     1.5 g 200 mL/hr over 30 Minutes Intravenous Every 6 hours 07/12/18 0715 07/12/18 1538   07/04/18 1000  rifaximin (XIFAXAN) tablet 550 mg  Status:  Discontinued     550 mg Per Tube 3 times daily 07/04/18 0925 07/12/18 1647   06/30/18 2200  vancomycin (VANCOCIN) IVPB 1000 mg/200 mL premix  Status:  Discontinued     1,000 mg 200 mL/hr over 60 Minutes Intravenous Every 24 hours 06/30/18 2008 07/04/18 8786  06/30/18 2200  piperacillin-tazobactam (ZOSYN) IVPB 3.375 g  Status:  Discontinued     3.375 g 12.5 mL/hr over 240 Minutes Intravenous Every 8 hours 06/30/18 2011 07/06/18 1149   06/30/18 0000  vancomycin variable dose per unstable renal function (pharmacist dosing)  Status:  Discontinued      Does not apply See admin instructions 06/29/18 1229 07/03/18 0747   06/29/18 1300  piperacillin-tazobactam (ZOSYN) IVPB 2.25 g  Status:  Discontinued     2.25 g 100 mL/hr over 30 Minutes Intravenous Every 8 hours 06/29/18 1229 06/30/18 2011   06/29/18 1300  vancomycin (VANCOCIN) 1,250 mg in sodium chloride 0.9 % 250 mL IVPB     1,250 mg 166.7 mL/hr over 90 Minutes Intravenous  Once 06/29/18 1229 06/29/18 2234       Assessment/Plan Nash cirrhosis with hepatic encephalopathy New onset atrial fibrillation with rapid ventricular rate New onset cardiomyopathy with EF 30 to 35% NewRight frontal operculum infarct History of seizure disorder Hypertension Type 2 diabetes mellitus History of schizophrenia   Acute hypoxic respiratory failure - s/p trach 8/7 Septic shock due to perforated descending colon S/p ex-lap with ileocecectomy and partial descending colectomy left in discontinuity with abdominal wound VAC placement 07/12/18 Dr. Barry Dienes   S/p ex-lap partial colectomy with closure of abdomen and creation of end ileostomy 07/14/18 Dr. Brantley Stage  S/p exploratory laparotomy with closure and VAC placement 07/20/18 Dr. Rosendo Gros - POD#3 -Tol TF at 20 cc/h - try to increase to 25 cc/h today  - continue erythromycin for motility  - continue VAC changes M/W/F Leukocytosis- WBC 23 trending down, pt afebrile  - CT abdomen/pelvis yesterday showed concern for pneumatosis but ex-lap negative for bowel ischemia or intraabdominal abscess - etiology unclear but from a general surgery standpoint fine to stop zosyn whenever  FEN: IVF,TPN, TF at 25 cc/h; NGT  VTE: SCDs, heparin gtt ID: current abx - Zosyn 7/29>>, rifaximin 8/2>>  LOS: 30 days    Brigid Re , [facility] Surgery 08/06/2018, 8:39 AM Pager: [phone] Consults: [phone] Mon-Fri 7:00 am-4:30 pm Sat-Sun 7:00 am-11:30 am

## 2018-07-24 LAB — CBC WITH DIFFERENTIAL/PLATELET
BASOS ABS: 0 10*3/uL (ref 0.0–0.1)
Basophils Relative: 0 %
EOS ABS: 0 10*3/uL (ref 0.0–0.7)
Eosinophils Relative: 0 %
HCT: 25.1 % — ABNORMAL LOW (ref 36.0–46.0)
Hemoglobin: 7.2 g/dL — ABNORMAL LOW (ref 12.0–15.0)
LYMPHS PCT: 8 %
Lymphs Abs: 2.1 10*3/uL (ref 0.7–4.0)
MCH: 27.2 pg (ref 26.0–34.0)
MCHC: 28.7 g/dL — ABNORMAL LOW (ref 30.0–36.0)
MCV: 94.7 fL (ref 78.0–100.0)
Monocytes Absolute: 1.6 10*3/uL — ABNORMAL HIGH (ref 0.1–1.0)
Monocytes Relative: 6 %
NEUTROS PCT: 86 %
Neutro Abs: 23 10*3/uL — ABNORMAL HIGH (ref 1.7–7.7)
PLATELETS: 111 10*3/uL — AB (ref 150–400)
RBC: 2.65 MIL/uL — ABNORMAL LOW (ref 3.87–5.11)
RDW: 33.5 % — ABNORMAL HIGH (ref 11.5–15.5)
WBC: 26.7 10*3/uL — ABNORMAL HIGH (ref 4.0–10.5)

## 2018-07-24 LAB — COMPREHENSIVE METABOLIC PANEL
ALK PHOS: 172 U/L — AB (ref 38–126)
ALT: 159 U/L — ABNORMAL HIGH (ref 0–44)
ANION GAP: 19 — AB (ref 5–15)
AST: 207 U/L — ABNORMAL HIGH (ref 15–41)
Albumin: 1.2 g/dL — ABNORMAL LOW (ref 3.5–5.0)
BILIRUBIN TOTAL: 8 mg/dL — AB (ref 0.3–1.2)
BUN: 71 mg/dL — ABNORMAL HIGH (ref 8–23)
CALCIUM: 7.7 mg/dL — AB (ref 8.9–10.3)
CO2: 14 mmol/L — ABNORMAL LOW (ref 22–32)
Chloride: 114 mmol/L — ABNORMAL HIGH (ref 98–111)
Creatinine, Ser: 1.65 mg/dL — ABNORMAL HIGH (ref 0.44–1.00)
GFR calc non Af Amer: 32 mL/min — ABNORMAL LOW (ref >60)
GFR, EST AFRICAN AMERICAN: 37 mL/min — AB (ref >60)
Glucose, Bld: 115 mg/dL — ABNORMAL HIGH (ref 70–99)
Potassium: 5.6 mmol/L — ABNORMAL HIGH (ref 3.5–5.1)
SODIUM: 147 mmol/L — AB (ref 135–145)
TOTAL PROTEIN: 3.4 g/dL — AB (ref 6.5–8.1)

## 2018-07-24 LAB — PROTIME-INR
INR: 1.6
PROTHROMBIN TIME: 18.9 s — AB (ref 11.4–15.2)

## 2018-07-24 LAB — CULTURE, BLOOD (ROUTINE X 2)
CULTURE: NO GROWTH
Culture: NO GROWTH
Special Requests: ADEQUATE

## 2018-07-29 MED FILL — Medication: Qty: 1 | Status: AC

## 2018-07-30 IMAGING — DX DG CHEST 1V PORT
1 series · 1 of 1 positions shown · non-contrast
Comparison: 07/09/2018

CLINICAL DATA: Acute respiratory failure

EXAM:
PORTABLE CHEST 1 VIEW

[chest]
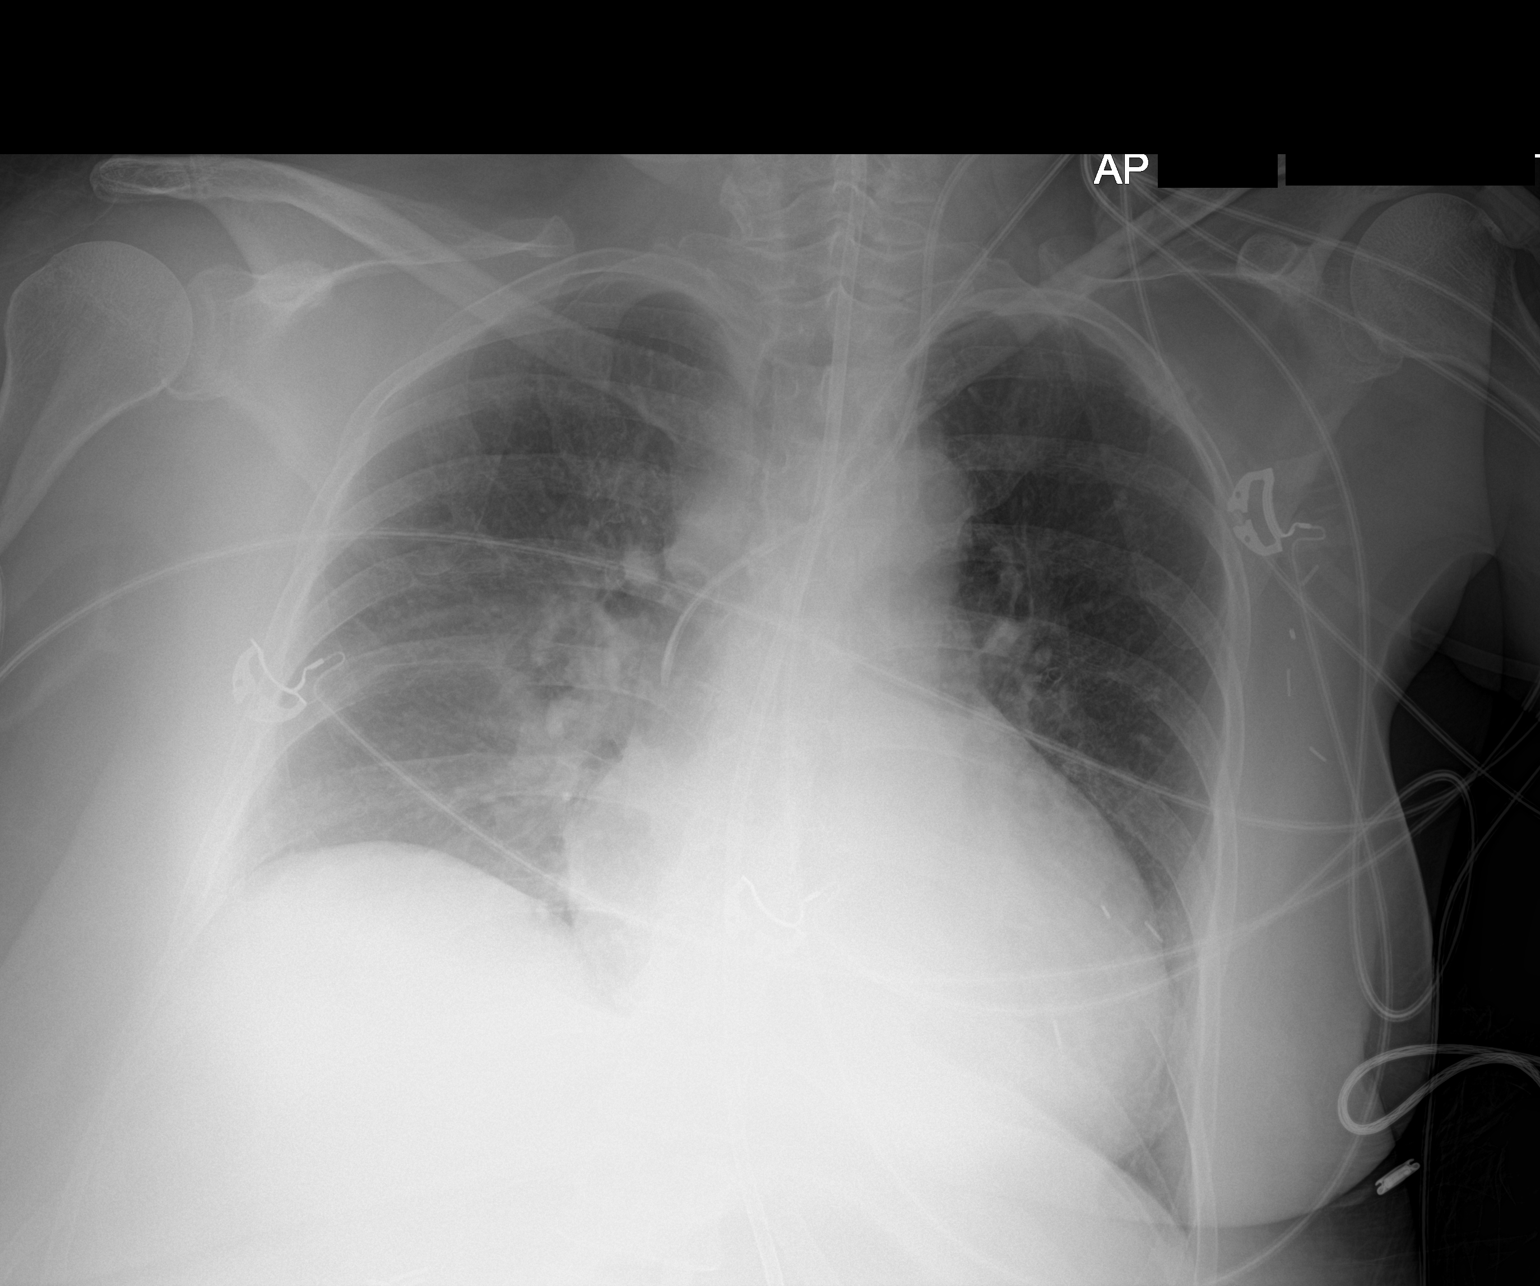

[1 of 1 positions shown; findings below may reference images not displayed]

FINDINGS: Jugular central line and feeding catheter are again seen and stable.
The endotracheal tube has been removed in the interval. Cardiac
shadow is enlarged but stable. The lungs are well aerated
bilaterally. No focal infiltrate or sizable effusion is seen. No
bony abnormality is noted.
IMPRESSION: Tubes and lines as described above.  No acute abnormality seen.

## 2018-08-05 IMAGING — DX DG CHEST 1V PORT
1 series · 1 of 1 positions shown · non-contrast
Comparison: 07/15/2018.

CLINICAL DATA: Respiratory failure.

EXAM:
PORTABLE CHEST 1 VIEW

[chest]
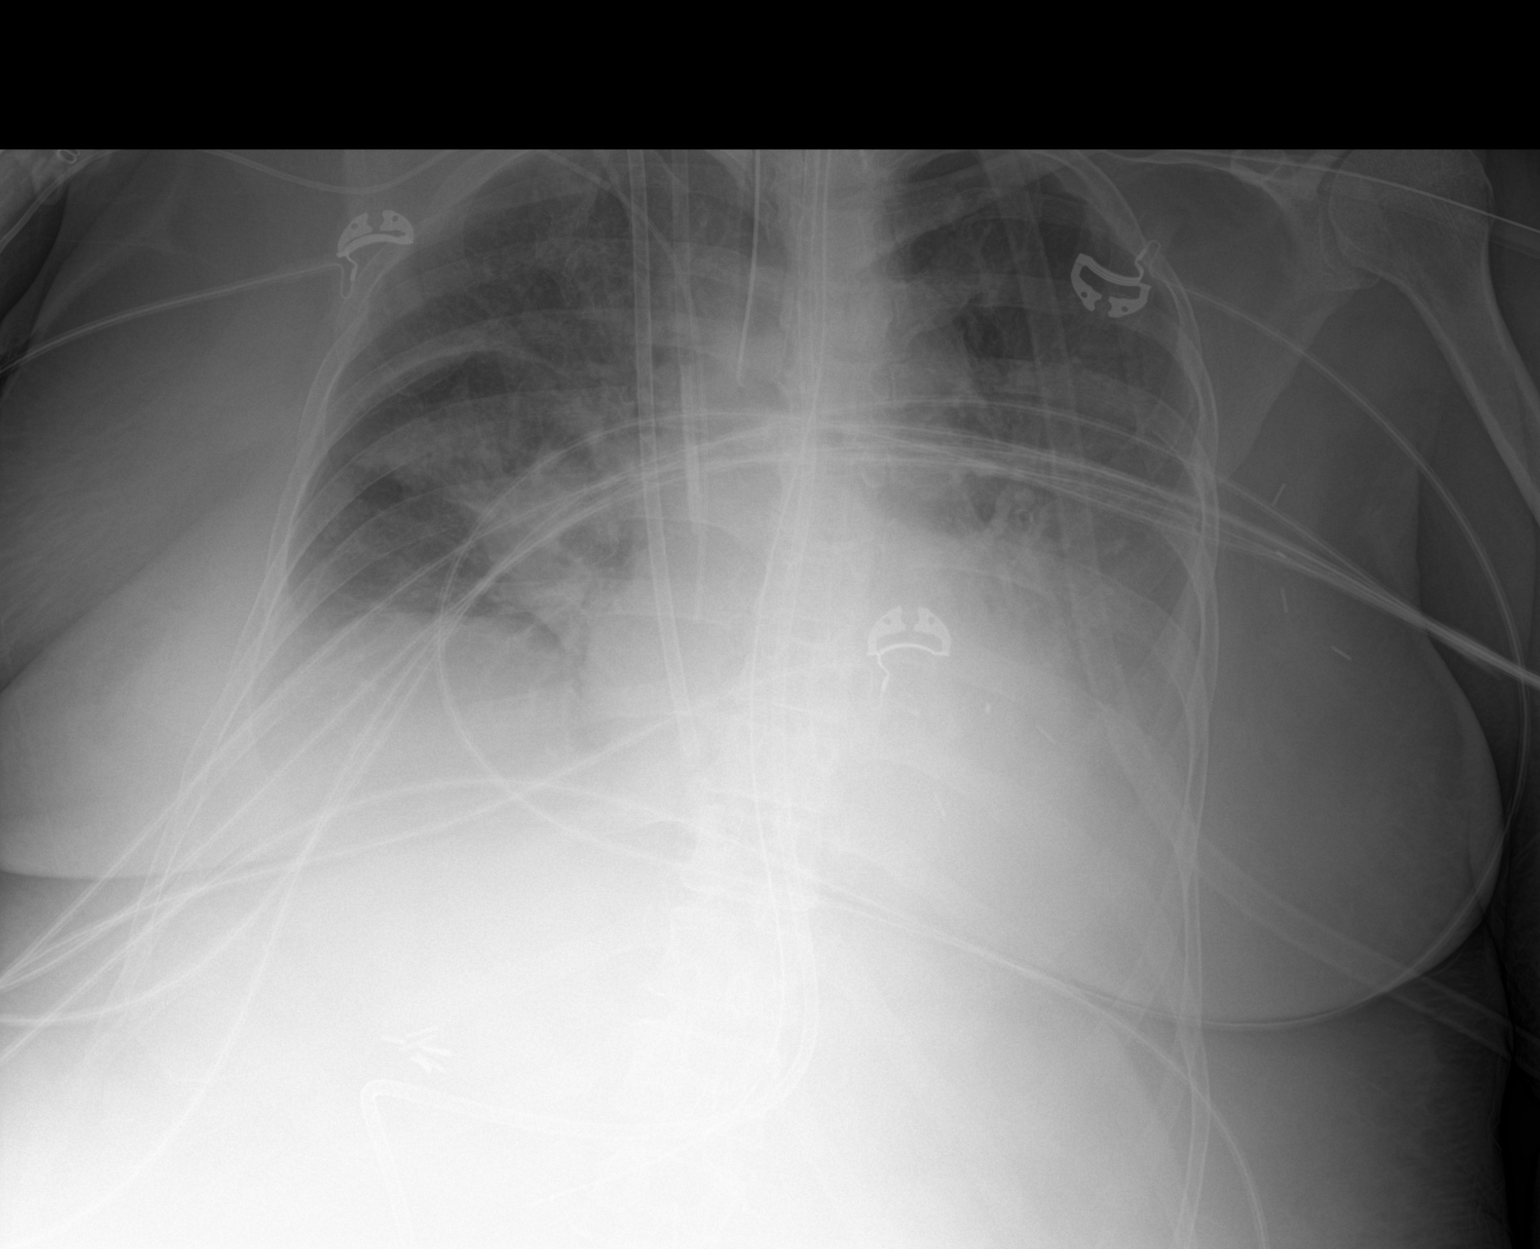

[1 of 1 positions shown; findings below may reference images not displayed]

FINDINGS: Endotracheal tube tip 1.5 cm above the carina. Right jugular
catheter tip in the inferior aspect of the superior vena cava. Right
PICC tip at the superior cavoatrial junction. Nasogastric tube tip
in the distal stomach. Feeding tube extending into the proximal
duodenum with its tip not included.

Stable enlarged cardiac silhouette. Increased left basilar airspace
opacity and pleural fluid. Decreased right basilar airspace opacity
and pleural fluid. Increased right perihilar airspace opacity.
Positional scoliosis. Cholecystectomy clips. Left breast and
axillary surgical clips.
IMPRESSION: 1. Increased left basilar atelectasis or pneumonia and pleural
fluid.
2. Decreased right basilar atelectasis and pleural fluid.
3. Increased right perihilar atelectasis or pneumonia.
4. Low position of the endotracheal tube with its tip 1.5 cm above
the carina. This could be retracted 3.5 cm.

## 2018-08-12 IMAGING — DX DG ABD PORTABLE 1V
1 series · 1 of 1 positions shown · non-contrast
Comparison: CT abdomen pelvis dated July 20, 2018.

CLINICAL DATA: Feeding tube placement.

EXAM:
PORTABLE ABDOMEN - 1 VIEW

[abdomen]
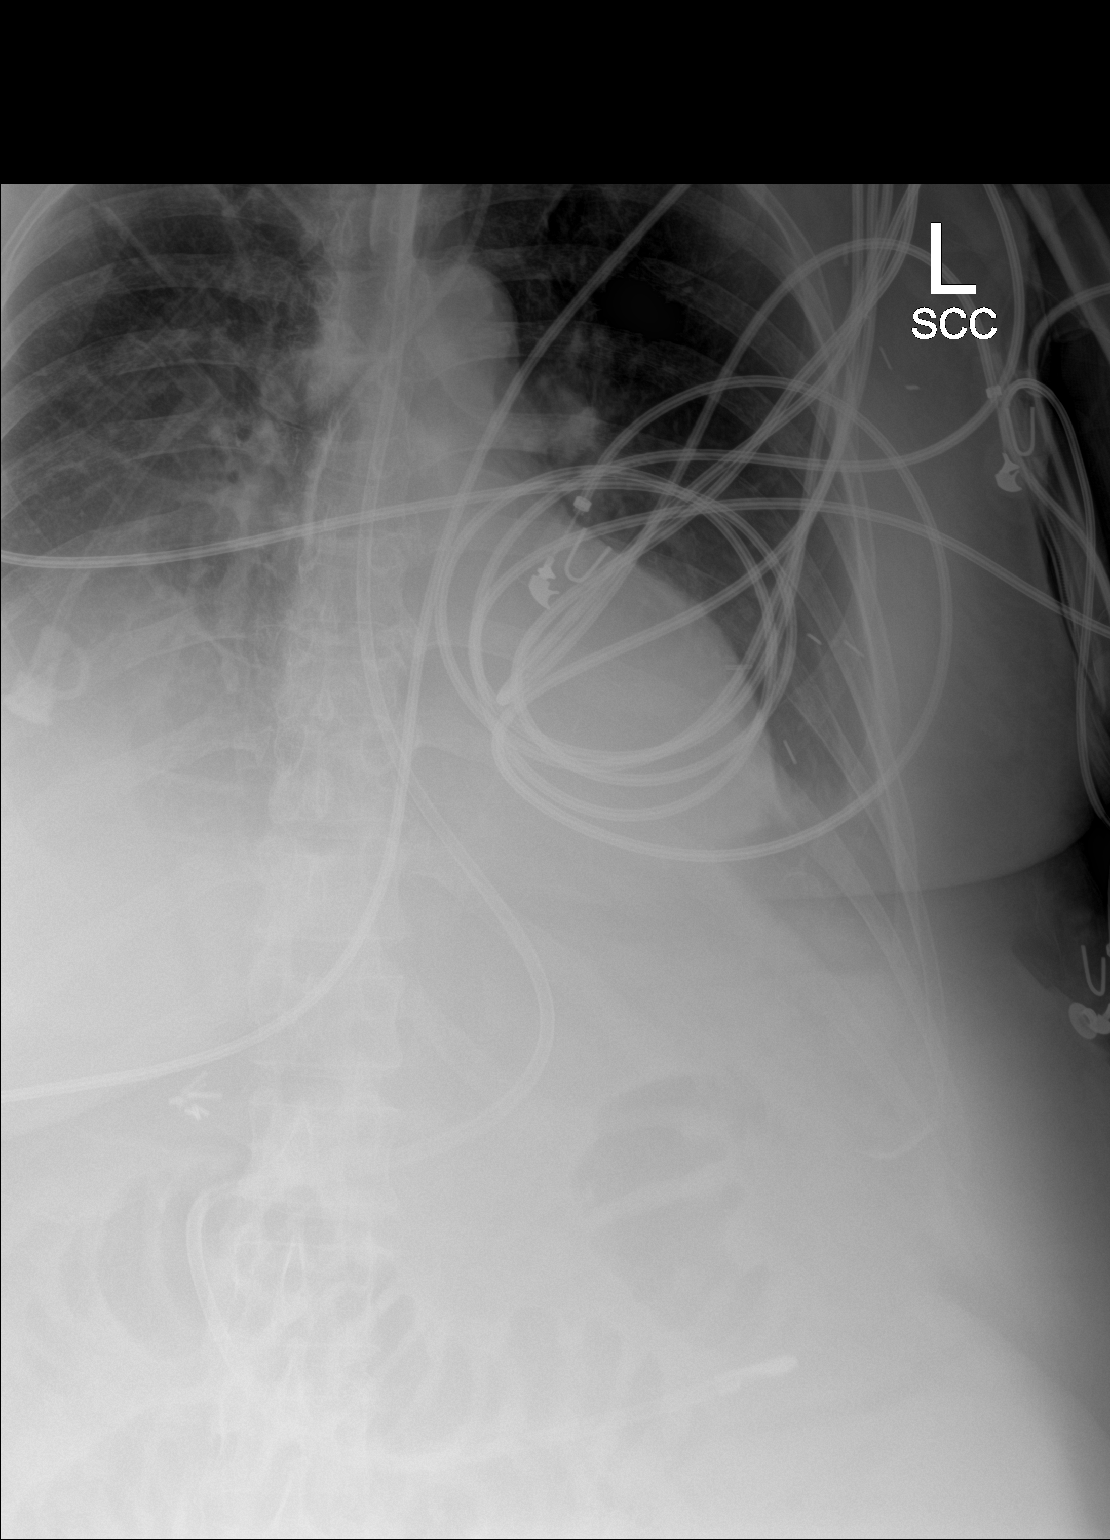

[1 of 1 positions shown; findings below may reference images not displayed]

FINDINGS: Unchanged feeding tube positioning with the tip in the third portion
of the duodenum. Mildly dilated and thickened loops of small bowel
are unchanged.
IMPRESSION: 1. Feeding tube tip in the third portion of the duodenum.

## 2018-08-12 IMAGING — DX DG CHEST 1V PORT
1 series · 1 of 1 positions shown · non-contrast
Comparison: 07/22/2018 and prior radiograph

CLINICAL DATA: Respiratory failure

EXAM:
PORTABLE CHEST 1 VIEW

[chest ap]
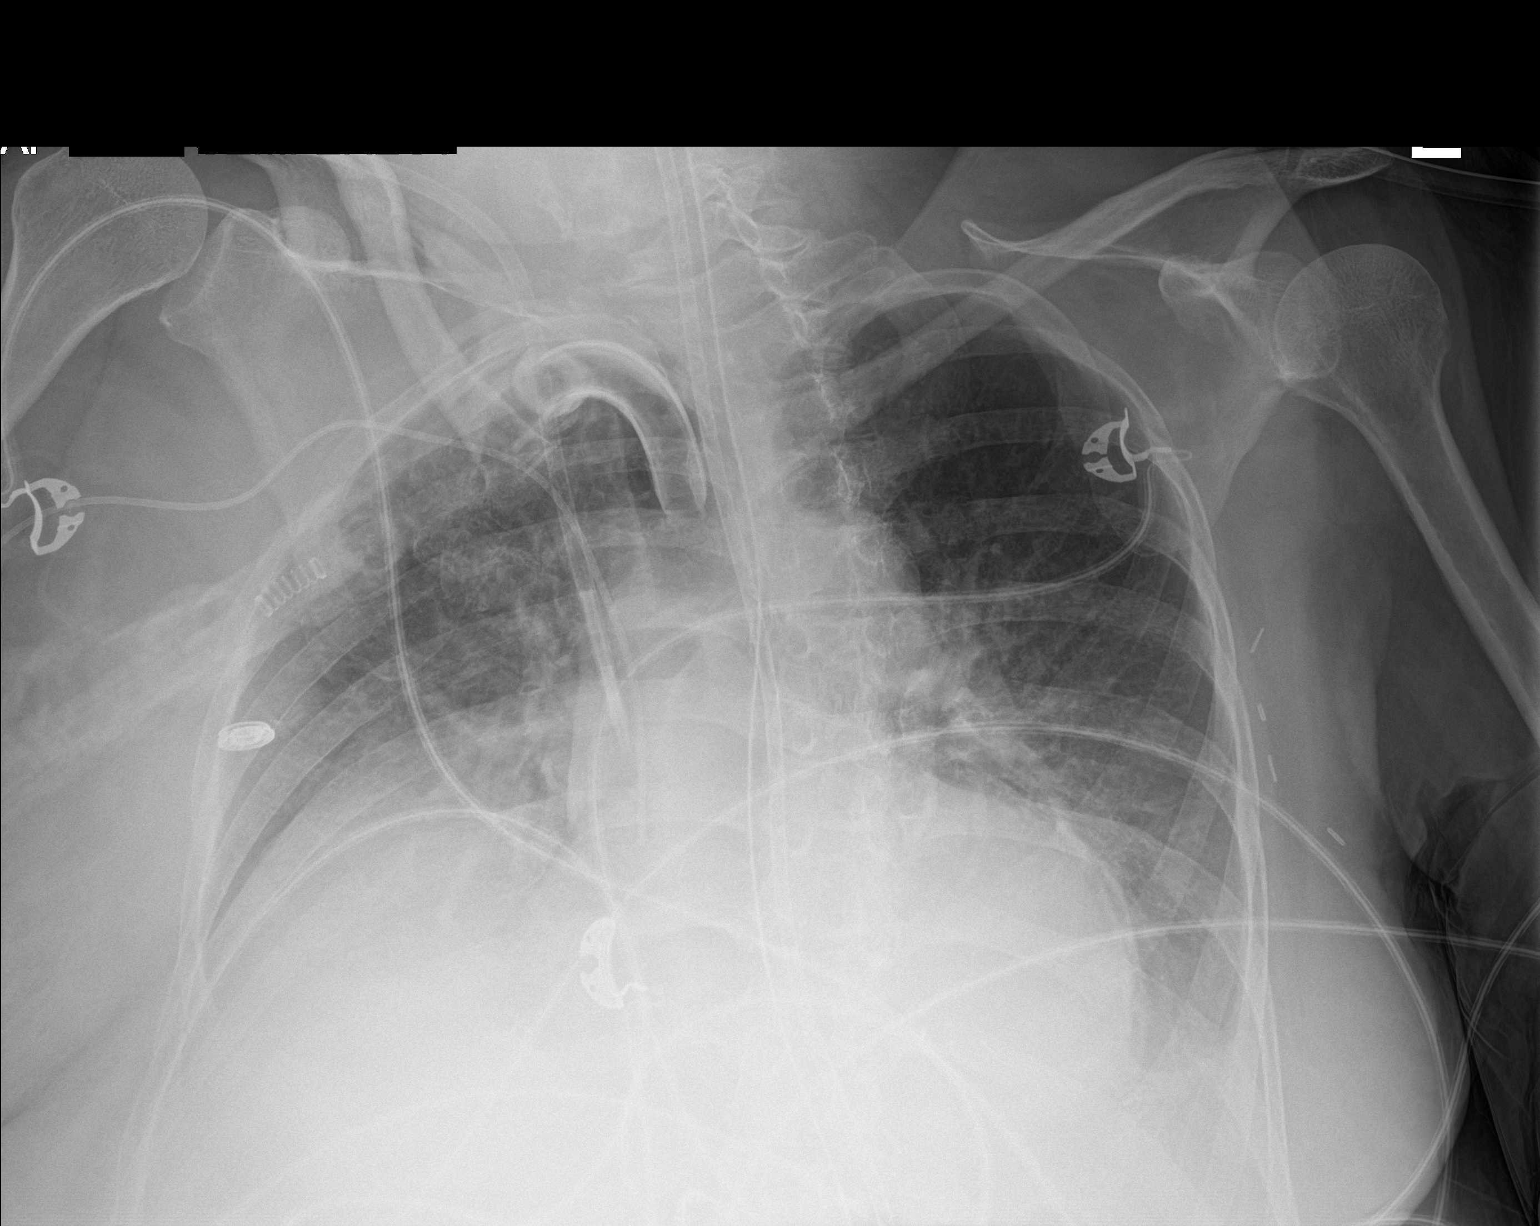

[1 of 1 positions shown; findings below may reference images not displayed]

FINDINGS: Tracheostomy tube, RIGHT IJ central venous catheter with tip
overlying the LOWER SVC and RIGHT PICC line with tip overlying the
SUPERIOR cavoatrial junction again noted.

Small bore feeding tube in NG tube enter the stomach with tip soft
the field of view.

Pulmonary vascular congestion, bilateral pleural effusions and
bibasilar atelectasis again noted. The cardiomediastinal silhouette
is unchanged.

Improved LEFT LOWER lung aeration noted with slightly decreased
RIGHT lung volumes.
IMPRESSION: Improved LEFT LOWER lung aeration and slightly decreased RIGHT lung
volumes. No other significant change.

## 2018-08-12 IMAGING — DX DG CHEST 1V PORT
1 series · 1 of 1 positions shown · non-contrast
Comparison: Chest x-ray from same day at [DATE].

CLINICAL DATA: Respiratory failure.

EXAM:
PORTABLE CHEST 1 VIEW

[chest]
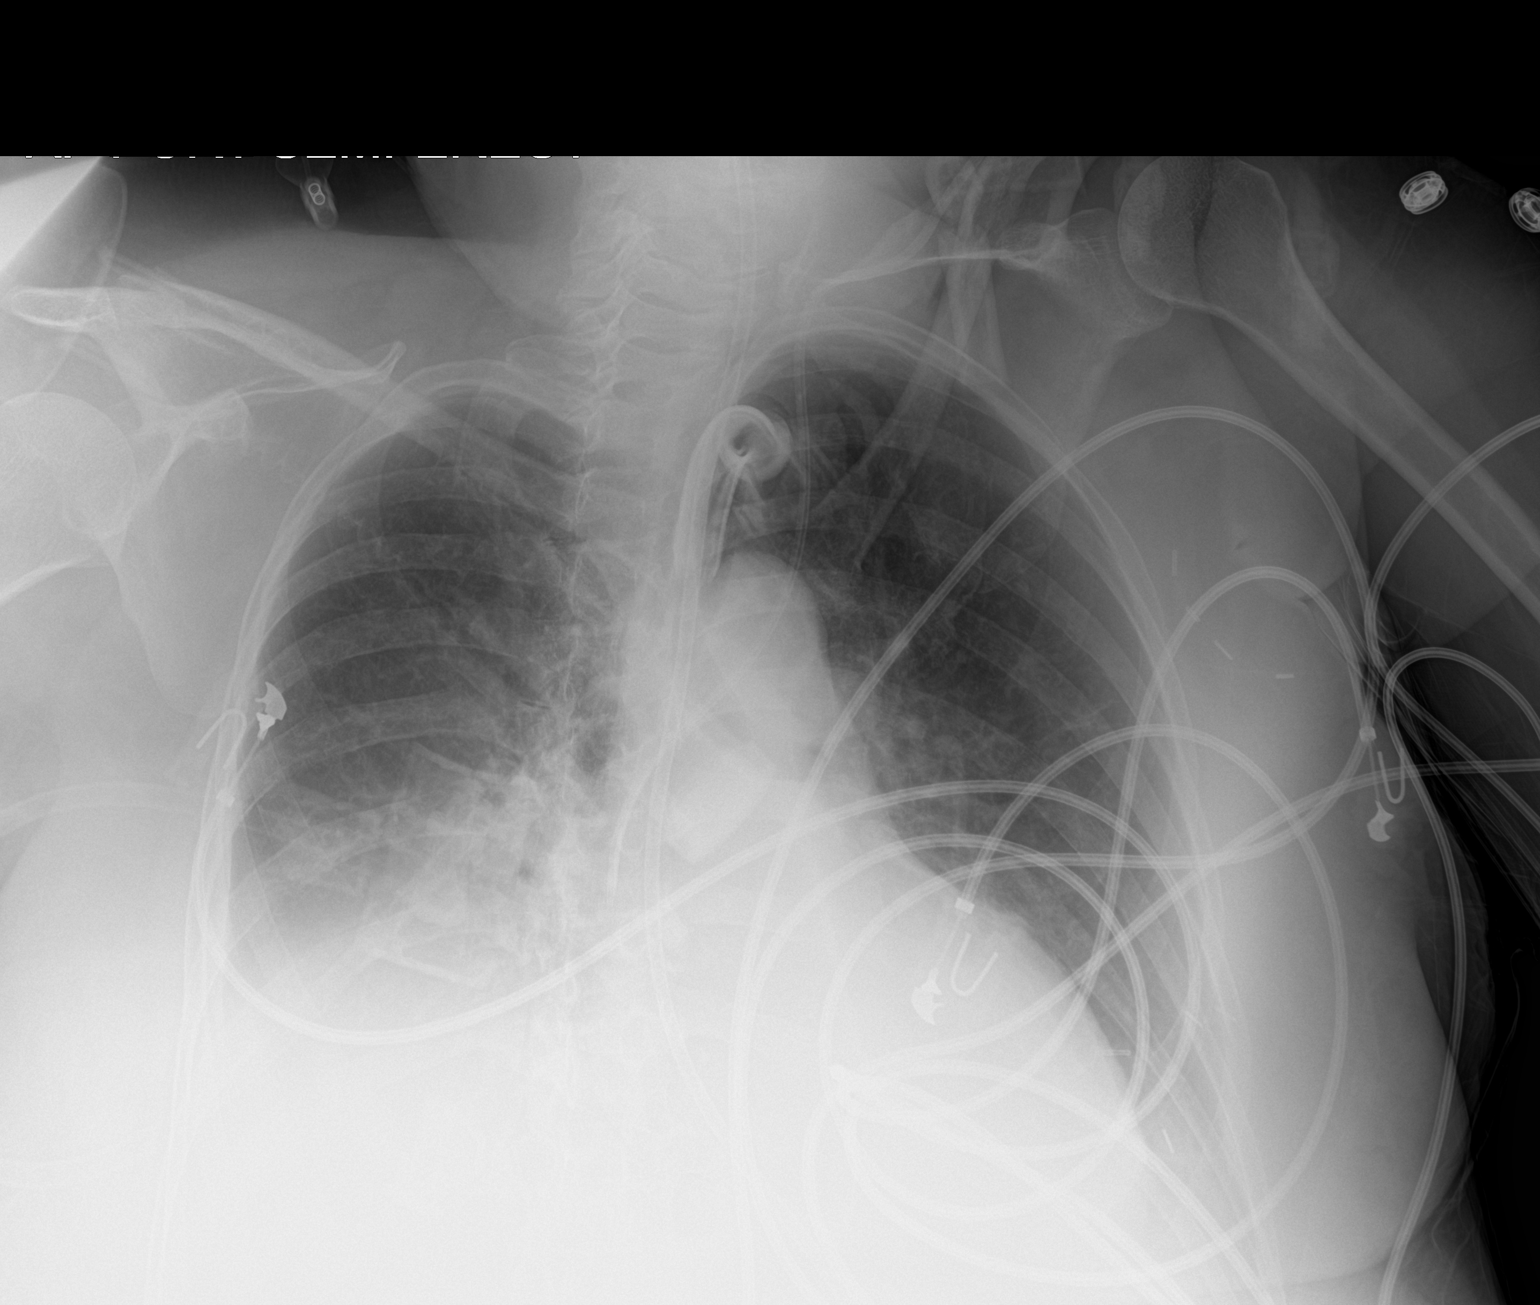

[1 of 1 positions shown; findings below may reference images not displayed]

FINDINGS: The patient is significantly rotated, limiting evaluation. Unchanged
tracheostomy tube, feeding tube, and left internal jugular central
venous catheter. Interval removal of the right internal jugular
catheter and right upper extremity PICC line.

Stable cardiomegaly. Normal pulmonary vascularity. Unchanged
elevation of the right hemidiaphragm with adjacent atelectasis.
Persistent retrocardiac consolidation. No large pleural effusion. No
pneumothorax. No acute osseous abnormality.
IMPRESSION: 1. Unchanged left lower lobe airspace disease.
2. Unchanged right lower lobe atelectasis.

## 2018-08-16 DEATH — deceased

## 2018-10-13 ENCOUNTER — Ambulatory Visit: Payer: Medicare Other | Admitting: Family Medicine

## 2018-11-03 ENCOUNTER — Ambulatory Visit: Payer: Medicare Other | Admitting: Adult Health
